# Patient Record
Sex: Female | Born: 1940 | Race: White | Hispanic: No | Smoking: Never smoker
Health system: Southern US, Community
[De-identification: ages and names within clinical notes are randomized; demographics above are authoritative.]

## PROBLEM LIST (undated history)

## (undated) DIAGNOSIS — I10 Essential (primary) hypertension: Secondary | ICD-10-CM

## (undated) DIAGNOSIS — K219 Gastro-esophageal reflux disease without esophagitis: Secondary | ICD-10-CM

## (undated) DIAGNOSIS — E039 Hypothyroidism, unspecified: Secondary | ICD-10-CM

## (undated) DIAGNOSIS — IMO0002 Reserved for concepts with insufficient information to code with codable children: Secondary | ICD-10-CM

## (undated) DIAGNOSIS — J4520 Mild intermittent asthma, uncomplicated: Secondary | ICD-10-CM

## (undated) DIAGNOSIS — I509 Heart failure, unspecified: Secondary | ICD-10-CM

## (undated) DIAGNOSIS — J84112 Idiopathic pulmonary fibrosis: Secondary | ICD-10-CM

## (undated) DIAGNOSIS — R5382 Chronic fatigue, unspecified: Secondary | ICD-10-CM

## (undated) DIAGNOSIS — E079 Disorder of thyroid, unspecified: Secondary | ICD-10-CM

## (undated) DIAGNOSIS — C801 Malignant (primary) neoplasm, unspecified: Secondary | ICD-10-CM

## (undated) DIAGNOSIS — R0602 Shortness of breath: Secondary | ICD-10-CM

## (undated) DIAGNOSIS — I209 Angina pectoris, unspecified: Secondary | ICD-10-CM

## (undated) DIAGNOSIS — J849 Interstitial pulmonary disease, unspecified: Secondary | ICD-10-CM

## (undated) DIAGNOSIS — Z836 Family history of other diseases of the respiratory system: Secondary | ICD-10-CM

## (undated) DIAGNOSIS — I639 Cerebral infarction, unspecified: Secondary | ICD-10-CM

## (undated) DIAGNOSIS — N8189 Other female genital prolapse: Secondary | ICD-10-CM

## (undated) HISTORY — DX: Other female genital prolapse: N81.89

## (undated) HISTORY — DX: Chronic fatigue, unspecified: R53.82

## (undated) HISTORY — PX: VAGINAL HYSTERECTOMY: SUR661

## (undated) HISTORY — DX: Interstitial pulmonary disease, unspecified: J84.9

## (undated) HISTORY — DX: Gastro-esophageal reflux disease without esophagitis: K21.9

## (undated) HISTORY — DX: Idiopathic pulmonary fibrosis: J84.112

## (undated) HISTORY — DX: Disorder of thyroid, unspecified: E07.9

## (undated) HISTORY — PX: TONSILLECTOMY: SUR1361

## (undated) HISTORY — PX: SHOULDER SURGERY: SHX246

## (undated) HISTORY — DX: Family history of other diseases of the respiratory system: Z83.6

## (undated) HISTORY — DX: Reserved for concepts with insufficient information to code with codable children: IMO0002

## (undated) HISTORY — DX: Mild intermittent asthma, uncomplicated: J45.20

## (undated) HISTORY — PX: OTHER SURGICAL HISTORY: SHX169

## (undated) HISTORY — DX: Essential (primary) hypertension: I10

---

## 1999-08-24 ENCOUNTER — Ambulatory Visit (HOSPITAL_COMMUNITY): Admission: RE | Admit: 1999-08-24 | Discharge: 1999-08-24 | Payer: Self-pay | Admitting: *Deleted

## 1999-08-24 ENCOUNTER — Encounter (INDEPENDENT_AMBULATORY_CARE_PROVIDER_SITE_OTHER): Payer: Self-pay | Admitting: Specialist

## 1999-08-24 ENCOUNTER — Encounter: Payer: Self-pay | Admitting: *Deleted

## 2001-04-29 ENCOUNTER — Encounter: Admission: RE | Admit: 2001-04-29 | Discharge: 2001-04-29 | Payer: Self-pay | Admitting: Internal Medicine

## 2001-04-29 ENCOUNTER — Encounter: Payer: Self-pay | Admitting: Internal Medicine

## 2002-05-19 ENCOUNTER — Encounter: Admission: RE | Admit: 2002-05-19 | Discharge: 2002-05-19 | Payer: Self-pay | Admitting: Internal Medicine

## 2002-05-19 ENCOUNTER — Encounter: Payer: Self-pay | Admitting: Internal Medicine

## 2003-05-30 ENCOUNTER — Encounter: Admission: RE | Admit: 2003-05-30 | Discharge: 2003-05-30 | Payer: Self-pay | Admitting: Internal Medicine

## 2004-06-25 ENCOUNTER — Encounter: Admission: RE | Admit: 2004-06-25 | Discharge: 2004-06-25 | Payer: Self-pay | Admitting: Internal Medicine

## 2005-05-24 ENCOUNTER — Encounter (INDEPENDENT_AMBULATORY_CARE_PROVIDER_SITE_OTHER): Payer: Self-pay | Admitting: *Deleted

## 2005-05-24 ENCOUNTER — Ambulatory Visit (HOSPITAL_COMMUNITY): Admission: RE | Admit: 2005-05-24 | Discharge: 2005-05-24 | Payer: Self-pay | Admitting: Gastroenterology

## 2005-05-24 LAB — HM COLONOSCOPY

## 2005-07-09 ENCOUNTER — Encounter: Admission: RE | Admit: 2005-07-09 | Discharge: 2005-07-09 | Payer: Self-pay | Admitting: Internal Medicine

## 2005-11-20 ENCOUNTER — Encounter: Admission: RE | Admit: 2005-11-20 | Discharge: 2005-11-20 | Payer: Self-pay | Admitting: Internal Medicine

## 2005-12-18 ENCOUNTER — Ambulatory Visit: Payer: Self-pay | Admitting: Podiatry

## 2006-07-11 ENCOUNTER — Encounter: Admission: RE | Admit: 2006-07-11 | Discharge: 2006-07-11 | Payer: Self-pay | Admitting: Internal Medicine

## 2007-08-26 ENCOUNTER — Encounter: Admission: RE | Admit: 2007-08-26 | Discharge: 2007-08-26 | Payer: Self-pay | Admitting: Internal Medicine

## 2007-09-08 ENCOUNTER — Encounter: Admission: RE | Admit: 2007-09-08 | Discharge: 2007-09-08 | Payer: Self-pay | Admitting: Internal Medicine

## 2008-06-10 HISTORY — PX: NECK SURGERY: SHX720

## 2008-09-20 ENCOUNTER — Encounter: Admission: RE | Admit: 2008-09-20 | Discharge: 2008-09-20 | Payer: Self-pay | Admitting: Internal Medicine

## 2008-10-26 ENCOUNTER — Emergency Department: Payer: Self-pay | Admitting: Emergency Medicine

## 2008-10-28 ENCOUNTER — Emergency Department: Payer: Self-pay | Admitting: Unknown Physician Specialty

## 2009-01-18 ENCOUNTER — Ambulatory Visit (HOSPITAL_COMMUNITY): Admission: RE | Admit: 2009-01-18 | Discharge: 2009-01-19 | Payer: Self-pay | Admitting: Orthopaedic Surgery

## 2009-09-19 ENCOUNTER — Inpatient Hospital Stay (HOSPITAL_COMMUNITY): Admission: EM | Admit: 2009-09-19 | Discharge: 2009-09-23 | Payer: Self-pay | Admitting: Emergency Medicine

## 2009-10-04 ENCOUNTER — Encounter: Admission: RE | Admit: 2009-10-04 | Discharge: 2009-10-04 | Payer: Self-pay | Admitting: Internal Medicine

## 2009-12-13 ENCOUNTER — Ambulatory Visit: Payer: Self-pay | Admitting: Family Medicine

## 2009-12-13 DIAGNOSIS — J0100 Acute maxillary sinusitis, unspecified: Secondary | ICD-10-CM | POA: Insufficient documentation

## 2009-12-13 DIAGNOSIS — J0120 Acute ethmoidal sinusitis, unspecified: Secondary | ICD-10-CM | POA: Insufficient documentation

## 2010-05-01 ENCOUNTER — Encounter: Admission: RE | Admit: 2010-05-01 | Discharge: 2010-05-01 | Payer: Self-pay | Admitting: Internal Medicine

## 2010-07-01 ENCOUNTER — Encounter: Payer: Self-pay | Admitting: Internal Medicine

## 2010-07-10 NOTE — Assessment & Plan Note (Signed)
Summary: SORE THROAT AND COLD-LIKE SYMPTOMS/EVM   Vital Signs:  Patient Profile:   70 Years Old Female CC:      Cold & URI symptoms Height:     62 inches Weight:      141 pounds BMI:     25.88 O2 Sat:      98 % O2 treatment:    Room Air Temp:     97.1 degrees F oral Pulse rate:   94 / minute Pulse rhythm:   regular Resp:     20 per minute BP sitting:   141 / 91  (left arm)  Pt. in pain?   yes    Location:   neck    Intensity:   3    Type:       aching  Vitals Entered By: Brent Bulla EMT-P (December 13, 2009 4:19 PM)              Is Patient Diabetic? No      Current Allergies: No known allergies History of Present Illness History from: patient Reason for visit: see chief complaint Chief Complaint: Cold & URI symptoms History of Present Illness: For 3-4 days she has been complaining of nasal congestion and headache. No f/c. For last 2 days she has had constant clear rhinorrhea and dry cough. At times she reports that she is unable to catch her breath b/c of the coughing. The coughing she belives is worse at night. She wants to know if there is something that she may take as she is celebrating her 69th birthday in a couple of day and is planning to travel.   REVIEW OF SYSTEMS Constitutional Symptoms       Complains of fatigue.     Denies fever, chills, and night sweats.  Ear/Nose/Throat/Mouth       Complains of ear pain, frequent runny nose, sinus problems, and hoarseness.      Denies change in hearing, ear discharge, dizziness, frequent nose bleeds, sore throat, and tooth pain or bleeding.  Respiratory       Complains of dry cough and shortness of breath.      Denies productive cough, wheezing, asthma, bronchitis, and emphysema/COPD.  Cardiovascular       Denies chest pain.    Neurological       Complains of headaches.    Social History: Retired Married Never Smoked Smoking Status:  never Physical Exam General appearance: well developed, well nourished, no acute  distress Head: +  maxillary sinus tenderness on the right Ears: fluid noted without inflammation bilateral TM R>L Nasal: clear discharge Oral/Pharynx: erythematous with thin clear PND Neck: neck supple,  trachea midline, no masses Chest/Lungs: no rales, wheezes, or rhonchi bilateral, breath sounds equal without effort - dry cough on exam Heart: regular rate and  rhythm, no murmur Skin: no obvious rashes or lesions MSE: oriented to time, place, and person Assessment New Problems: ACUTE MAXILLARY SINUSITIS (ICD-461.0) ACUTE ETHMOIDAL SINUSITIS (ICD-461.2)   Plan New Medications/Changes: ZITHROMAX Z-PAK 250 MG TABS (AZITHROMYCIN) take as directed  #1 pk x 0, 12/13/2009, Richardine Service MD  New Orders: Solumedrol up to 191m [J2930] Admin of Therapeutic Inj (IM or [place]) [[47340]New Patient Level II [[37096] The patient and/or caregiver has been counseled thoroughly with regard to medications prescribed including dosage, schedule, interactions, rationale for use, and possible side effects and they verbalize understanding.  Diagnoses and expected course of recovery discussed and will return if not improved as expected or if the  condition worsens. Patient and/or caregiver verbalized understanding.  Prescriptions: ZITHROMAX Z-PAK 250 MG TABS (AZITHROMYCIN) take as directed  #1 pk x 0   Entered and Authorized by:   Richardine Service MD   Signed by:   Richardine Service MD on 12/13/2009   Method used:   Electronically to        [facility])       [address], [place]  [address]       Ph: [phone]       Fax: [phone]   RxID:   [id number]   Patient Instructions: 1)  Acute sinusitis symptoms for less than 10 days may not be helped by antibiotics. 2)  Use warm moist compresses over affected areas to relieve pain. 3)  Over the counter decongestants such as Mucinex is okay to take as directed for 5-7 days. Make sure you  increased your amount of fluid intake.  4)  Use otc nasal wash twice a day for relief of symptoms. example - Netti Pot 5)  Call if no improvement in 5-7 days, sooner if increasing pain, fever, or new symptoms.  Medication Administration  Injection # 1:    Medication: Solumedrol up to 159m    Diagnosis: ACUTE MAXILLARY SINUSITIS (ICD-461.0)    Route: IM    Site: RUOQ gluteus    Exp Date: 07/10/2012    Lot #: [id number]    Patient tolerated injection without complications    Given by: RBrent BullaEMT-P (December 13, 2009 4:47 PM)  Orders Added: 1)  Solumedrol up to 1258m[J2930] 2)  Admin of Therapeutic Inj (IM or [place]) [9[33448])  New Patient Level II [99202]  The patient was informed that there is no on-call provider or services available at this clinic during off-hours (when the clinic is closed).  If the patient developed a problem or concern that required immediate attention, the patient was advised to go the the nearest available urgent care or emergency department for medical care.  The patient verbalized understanding.    The risks, benefits and possible side effects of the treatments and tests were explained clearly to the patient and the patient verbalized understanding.

## 2010-08-28 LAB — COMPREHENSIVE METABOLIC PANEL
ALT: 18 U/L (ref 0–35)
BUN: 9 mg/dL (ref 6–23)
CO2: 23 mEq/L (ref 19–32)
Calcium: 8.4 mg/dL (ref 8.4–10.5)
Chloride: 108 mEq/L (ref 96–112)
Creatinine, Ser: 0.83 mg/dL (ref 0.4–1.2)
GFR calc non Af Amer: >60 mL/min (ref >60)
Glucose, Bld: 83 mg/dL (ref 70–99)
Sodium: 138 mEq/L (ref 135–145)
Total Protein: 5.7 g/dL — ABNORMAL LOW (ref 6.0–8.3)

## 2010-08-28 LAB — CBC
HCT: 31.3 % — ABNORMAL LOW (ref 36.0–46.0)
Hemoglobin: 10.9 g/dL — ABNORMAL LOW (ref 12.0–15.0)
MCHC: 34.9 g/dL (ref 30.0–36.0)
MCV: 86.7 fL (ref 78.0–100.0)
RBC: 3.61 MIL/uL — ABNORMAL LOW (ref 3.87–5.11)
RDW: 14 % (ref 11.5–15.5)
WBC: 7.4 10*3/uL (ref 4.0–10.5)

## 2010-08-28 LAB — HEPATIC FUNCTION PANEL
ALT: 14 U/L (ref 0–35)
AST: 18 U/L (ref 0–37)
Albumin: 2.7 g/dL — ABNORMAL LOW (ref 3.5–5.2)
Total Protein: 5.6 g/dL — ABNORMAL LOW (ref 6.0–8.3)

## 2010-08-29 LAB — BASIC METABOLIC PANEL
BUN: 15 mg/dL (ref 6–23)
CO2: 24 mEq/L (ref 19–32)
CO2: 24 mEq/L (ref 19–32)
Calcium: 8.1 mg/dL — ABNORMAL LOW (ref 8.4–10.5)
Calcium: 8.4 mg/dL (ref 8.4–10.5)
Creatinine, Ser: 1.03 mg/dL (ref 0.4–1.2)
Creatinine, Ser: 1.16 mg/dL (ref 0.4–1.2)
GFR calc Af Amer: 56 mL/min — ABNORMAL LOW (ref >60)
GFR calc Af Amer: >60 mL/min (ref >60)
GFR calc non Af Amer: 53 mL/min — ABNORMAL LOW (ref >60)
Glucose, Bld: 109 mg/dL — ABNORMAL HIGH (ref 70–99)
Potassium: 4.1 mEq/L (ref 3.5–5.1)
Sodium: 136 mEq/L (ref 135–145)
Sodium: 138 mEq/L (ref 135–145)

## 2010-08-29 LAB — URINALYSIS, ROUTINE W REFLEX MICROSCOPIC
Bilirubin Urine: NEGATIVE
Ketones, ur: 15 mg/dL — AB
Nitrite: POSITIVE — AB
Protein, ur: 30 mg/dL — AB
Urobilinogen, UA: 0.2 mg/dL (ref 0.0–1.0)
pH: 6 (ref 5.0–8.0)

## 2010-08-29 LAB — DIFFERENTIAL
Basophils Absolute: 0 10*3/uL (ref 0.0–0.1)
Basophils Absolute: 0 10*3/uL (ref 0.0–0.1)
Basophils Relative: 0 % (ref 0–1)
Basophils Relative: 0 % (ref 0–1)
Eosinophils Relative: 0 % (ref 0–5)
Eosinophils Relative: 1 % (ref 0–5)
Lymphocytes Relative: 10 % — ABNORMAL LOW (ref 12–46)
Monocytes Absolute: 0.1 10*3/uL (ref 0.1–1.0)
Monocytes Absolute: 1.1 10*3/uL — ABNORMAL HIGH (ref 0.1–1.0)
Monocytes Relative: 8 % (ref 3–12)
Neutro Abs: 10.5 10*3/uL — ABNORMAL HIGH (ref 1.7–7.7)

## 2010-08-29 LAB — URINE CULTURE: Colony Count: >=100000

## 2010-08-29 LAB — CBC
HCT: 31.6 % — ABNORMAL LOW (ref 36.0–46.0)
HCT: 36.3 % (ref 36.0–46.0)
Hemoglobin: 10.6 g/dL — ABNORMAL LOW (ref 12.0–15.0)
Hemoglobin: 10.9 g/dL — ABNORMAL LOW (ref 12.0–15.0)
MCHC: 33.6 g/dL (ref 30.0–36.0)
MCHC: 33.8 g/dL (ref 30.0–36.0)
Platelets: 117 10*3/uL — ABNORMAL LOW (ref 150–400)
Platelets: 152 10*3/uL (ref 150–400)
RBC: 3.59 MIL/uL — ABNORMAL LOW (ref 3.87–5.11)
RBC: 3.66 MIL/uL — ABNORMAL LOW (ref 3.87–5.11)
RBC: 4.21 MIL/uL (ref 3.87–5.11)
RDW: 14.3 % (ref 11.5–15.5)
WBC: 10.2 10*3/uL (ref 4.0–10.5)
WBC: 9.9 10*3/uL (ref 4.0–10.5)

## 2010-08-29 LAB — TSH: TSH: 0.213 u[IU]/mL — ABNORMAL LOW (ref 0.350–4.500)

## 2010-08-29 LAB — COMPREHENSIVE METABOLIC PANEL
AST: 24 U/L (ref 0–37)
Albumin: 3.5 g/dL (ref 3.5–5.2)
Alkaline Phosphatase: 55 U/L (ref 39–117)
BUN: 18 mg/dL (ref 6–23)
CO2: 25 mEq/L (ref 19–32)
Chloride: 104 mEq/L (ref 96–112)
Creatinine, Ser: 1.06 mg/dL (ref 0.4–1.2)
GFR calc Af Amer: >60 mL/min (ref >60)
GFR calc non Af Amer: 51 mL/min — ABNORMAL LOW (ref >60)
Potassium: 3.3 mEq/L — ABNORMAL LOW (ref 3.5–5.1)
Total Bilirubin: 1.4 mg/dL — ABNORMAL HIGH (ref 0.3–1.2)

## 2010-08-29 LAB — CULTURE, BLOOD (ROUTINE X 2)

## 2010-08-29 LAB — LIPID PANEL
Cholesterol: 95 mg/dL (ref 0–200)
HDL: 25 mg/dL — ABNORMAL LOW (ref >39)
Triglycerides: 120 mg/dL (ref <150)
VLDL: 24 mg/dL (ref 0–40)

## 2010-08-29 LAB — URINE MICROSCOPIC-ADD ON

## 2010-08-29 LAB — HEMOGLOBIN A1C: Mean Plasma Glucose: 151 mg/dL

## 2010-09-16 LAB — COMPREHENSIVE METABOLIC PANEL
AST: 25 U/L (ref 0–37)
BUN: 12 mg/dL (ref 6–23)
CO2: 29 mEq/L (ref 19–32)
Chloride: 102 mEq/L (ref 96–112)
Creatinine, Ser: 0.74 mg/dL (ref 0.4–1.2)
GFR calc Af Amer: >60 mL/min (ref >60)
GFR calc non Af Amer: >60 mL/min (ref >60)
Glucose, Bld: 89 mg/dL (ref 70–99)
Total Bilirubin: 0.6 mg/dL (ref 0.3–1.2)

## 2010-09-16 LAB — DIFFERENTIAL
Basophils Absolute: 0 10*3/uL (ref 0.0–0.1)
Basophils Relative: 1 % (ref 0–1)
Eosinophils Relative: 4 % (ref 0–5)
Lymphocytes Relative: 27 % (ref 12–46)
Monocytes Relative: 10 % (ref 3–12)
Neutro Abs: 3.5 10*3/uL (ref 1.7–7.7)
Neutrophils Relative %: 59 % (ref 43–77)

## 2010-09-16 LAB — CBC
HCT: 40.3 % (ref 36.0–46.0)
Hemoglobin: 13.4 g/dL (ref 12.0–15.0)
MCHC: 33.3 g/dL (ref 30.0–36.0)
MCV: 87.6 fL (ref 78.0–100.0)
RBC: 4.6 MIL/uL (ref 3.87–5.11)
WBC: 6 10*3/uL (ref 4.0–10.5)

## 2010-09-16 LAB — GLUCOSE, CAPILLARY: Glucose-Capillary: 127 mg/dL — ABNORMAL HIGH (ref 70–99)

## 2010-09-24 ENCOUNTER — Other Ambulatory Visit: Payer: Self-pay | Admitting: Internal Medicine

## 2010-09-24 DIAGNOSIS — Z1231 Encounter for screening mammogram for malignant neoplasm of breast: Secondary | ICD-10-CM

## 2010-10-08 ENCOUNTER — Ambulatory Visit
Admission: RE | Admit: 2010-10-08 | Discharge: 2010-10-08 | Disposition: A | Discharge status: Home / Self Care | Payer: Medicare Other | Source: Ambulatory Visit | Attending: Internal Medicine | Admitting: Internal Medicine

## 2010-10-08 DIAGNOSIS — Z1231 Encounter for screening mammogram for malignant neoplasm of breast: Secondary | ICD-10-CM

## 2010-10-23 NOTE — Op Note (Signed)
NAME:  Tina Calderon, Tina Calderon               ACCOUNT NO.:  [id number]   MEDICAL RECORD NO.:  [id number]          PATIENT TYPE:  INP   LOCATION:  5022                         FACILITY:  [facility]   PHYSICIAN:  Mark C. Lorin Mercy, M.D.    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   POSTOPERATIVE DIAGNOSIS:  C5-6, C6-7 spondylosis with foraminal  stenosis.   PROCEDURE:  C5-6, C6-7 anterior cervical diskectomy and fusion,  allograft ,plate, microscope assisted.   SURGEON:  Rodell Perna, MD   ASSISTANT:  Phillips Hay, PA-C   ESTIMATED BLOOD LOSS:  200 mL.   COMPONENTS USED:  VueLock Biomet EBI plate 28 mm with 41-UL locking  screws x6.   PROCEDURE:  After induction of general anesthesia with orotracheal  intubation, preoperative Ancef prophylaxis, arms tucked to the side with  pads over the ulnar nerve.  Careful padding and positioning, horseshoe  head holder usage, neck was prepped with DuraPrep after head halter  traction was applied without weight.  Area was squared with towels,  sterile skin marker was used in a prominent skin fold overlying the  planned region based on palpable landmarks with cricothyroid cartilage  and carotid tubercle as well as fingerbreadths above the clavicle.  Platysma was divided in line with the skin incision.  Blunt dissection  down the level of the longus colli was performed with carotid sheath and  contents lateral.  There were prominent spurs at C5-6 and C6-7 as  expected.  Short 25 needle was placed in the C5-6 level.  Diskectomy was  performed with a 15 scalpel blade after lateral C-arm image confirmed  the appropriate level.  Cloward curettes were used as well as bur with  removal of the endplates and progression of the posterior cortex.  With  the operative microscope the spurs removed, Cloward curette  and 1 and 2-  mm Kerrisons were used for removal of the posterior spurs and a thick  hypertrophic ligament.  Dura was completely exposed.  Trial sizers 6  gave an excellent fit.  Corticocancellous graft was  rehydrated, marked  anteriorly at the midline and then inserted with the CRNA applying  traction to the neck impacted and countersunk 1-2 mm.  Uncovertebral  joints had been stripped and spurs removed from both gutters.  Identical  procedure was performed at C6-7 level.  At this level, there again was  some spur overhung posteriorly with foraminal narrowing, worse on the  left than right which was removed.  With inserting the graft, the graft  inserted a little bit more dorsally and slightly more countersunk.  It  was checked using the microscope, feeling underneath with a black nerve  hook as well as behind the vertebral body on each side of the vertebrae.  The graft did not rotate as it was inserted still was in the midline and  there was still 2-3 mm behind the graft.  After irrigation C-arm was  brought in and graft  was checked.  Traction was applied to the neck this  was also pulled down on the shoulders using a traction pulling on the  fingers showed satisfactory position.  Plate was inserted and there was  good sizing and all screw holes were filled after plate holder was used  to check AP and lateral to make sure the plate was not too high or too  low.  Screws in C6 were exactly in the middle of the vertebral body.  After screws were filled, AP and lateral fluoroscopy were used to check.  Grafts were in good position and recheck of the bottom graft confirmed  that it was not too far posterior.  After irrigation, Hemovac was placed  in an in-and-out technique in line with the skin incision.  Operative  field was dry.  At the C6-7 level, there was an epidural vein in the  right side and some FloSeal was placed with a small patty.  This was dry  at the end of the procedure and at  the time of insertion of the screw  the screw insertion in the C7 body on the right had blood coming out  from the screw hole and this stopped once the screw was inserted.  Total  blood loss during the case was 200 mL.  Layered closure of platysma 3-0  Vicryl and 4-0 Vicryl.  Operative field was dry at the time of closure.  Careful inspection showed no areas of bleeding and the fingertip was  placed down distally or toward the chest in case there was an excess  fluid and it could decompress toward the mediastinum.  Operative field  was dry.  Skin closed with 4-0 Vicryl.  Tincture of Benzoin, Steri-  Strips, Marcaine infiltration, postop dressing and a soft cervical  collar was applied.  A 6-mm grasper was used at both levels and after  soft collar application, the patient was extubated and transferred to  the recovery room.  Instrument count and needle count was correct.      Mark C. Lorin Mercy, M.D.  Electronically Signed     MCY/MEDQ  D:  01/18/2009  T:  01/19/2009  Job:  [id number]

## 2010-10-26 NOTE — Op Note (Signed)
NAME:  Tina Calderon, Tina Calderon               ACCOUNT NO.:  [id number]   MEDICAL RECORD NO.:  [id number]          PATIENT TYPE:  AMB   LOCATION:  ENDO                         FACILITY:  [facility]   PHYSICIAN:  Nelwyn Salisbury, M.D.  DATE OF BIRTH:  1941/03/05   DATE OF PROCEDURE:  05/24/2005  DATE OF DISCHARGE:                                 OPERATIVE REPORT   PROCEDURE PERFORMED:  Colonoscopy with cold biopsies x3.   ENDOSCOPIST:  Nelwyn Salisbury, M.D.   INSTRUMENT USED:  Olympus video colonoscope.   INDICATIONS FOR PROCEDURE:  A 70 year old white female undergoing screening  colonoscopy to rule out colonic polyps, masses, etc.   PREPROCEDURE PREPARATION:  Informed consent was procured from the patient.  The patient was fasted for four hours prior to the procedure and prepped  with Osmoprep pills the night of and the morning of the procedure.  Risks  and benefits of the procedure including a 10% miss rate of cancer and polyps  was discussed with the patient as well.   PREPROCEDURE PHYSICAL:  VITAL SIGNS:  Stable vital signs.  NECK:  Supple.  CHEST:  Clear to auscultation.  CARDIOVASCULAR:  S1 and S2 regular.  ABDOMEN:  Soft with normal bowel sounds.   DESCRIPTION OF PROCEDURE:  The patient was placed in left lateral decubitus  position, sedated with 70 mcg of fentanyl and 6 mg of Versed in slow  incremental doses.  Once the patient was adequately sedated and maintained  on low flow oxygen and continuous cardiac monitoring, the Olympus video  colonoscope was advanced from the rectum to the cecum.  The appendiceal  orifice and ileocecal valve were clearly visualized and photographed.  Three  small sessile polyps were biopsied from the rectosigmoid colon (cold  biopsies x3).  A few early sigmoid diverticula were seen.  The rest of the  exam up to the terminal ileum was unremarkable.  The patient tolerated the  procedure well without complications.   IMPRESSION:  1.A few early sigmoid  diverticula seen.  2.Three small polyps biopsied from rectal sigmoid colon.  3.Otherwise normal exam up to the terminal ileum.   RECOMMENDATIONS:  1.Await  pathology results.  2.Repeat colonoscopy depending on pathology results.  3.Avoid nonsteroidals including aspirin for the next two weeks.  4.Outpatient follow-up as need arises in the future.      Nelwyn Salisbury, M.D.  Electronically Signed     JNM/MEDQ  D:  05/24/2005  T:  05/27/2005  Job:  [id number]   cc:   Ardeen Jourdain, M.D.  Fax: [phone]

## 2011-06-11 DIAGNOSIS — J84112 Idiopathic pulmonary fibrosis: Secondary | ICD-10-CM

## 2011-06-11 HISTORY — DX: Idiopathic pulmonary fibrosis: J84.112

## 2011-10-03 ENCOUNTER — Telehealth: Payer: Self-pay

## 2011-10-03 NOTE — Telephone Encounter (Signed)
Left message for pt to call the office/  Pt needs ov for pessary change.PG CMA

## 2011-10-04 ENCOUNTER — Telehealth: Payer: Self-pay

## 2011-10-04 NOTE — Telephone Encounter (Signed)
Pt. Returned call and scheduled appt for pessary insertion 10/18/11 @ 4:15 pm with Dr. Robby Sermon CMA

## 2011-10-14 ENCOUNTER — Telehealth: Payer: Self-pay

## 2011-10-14 NOTE — Telephone Encounter (Signed)
Left message for pt to call the office/next appt need to be rescheduled.PG CMA

## 2011-10-18 ENCOUNTER — Encounter: Payer: Self-pay | Admitting: Obstetrics and Gynecology

## 2011-10-22 ENCOUNTER — Other Ambulatory Visit: Payer: Self-pay | Admitting: Internal Medicine

## 2011-10-22 DIAGNOSIS — Z1231 Encounter for screening mammogram for malignant neoplasm of breast: Secondary | ICD-10-CM

## 2011-10-25 ENCOUNTER — Encounter: Payer: Self-pay | Admitting: Obstetrics and Gynecology

## 2011-11-08 ENCOUNTER — Ambulatory Visit
Admission: RE | Admit: 2011-11-08 | Discharge: 2011-11-08 | Disposition: A | Discharge status: Home / Self Care | Payer: Medicare Other | Source: Ambulatory Visit | Attending: Internal Medicine | Admitting: Internal Medicine

## 2011-11-08 DIAGNOSIS — Z1231 Encounter for screening mammogram for malignant neoplasm of breast: Secondary | ICD-10-CM

## 2011-11-11 ENCOUNTER — Encounter: Payer: Self-pay | Admitting: Obstetrics and Gynecology

## 2011-11-11 ENCOUNTER — Ambulatory Visit (INDEPENDENT_AMBULATORY_CARE_PROVIDER_SITE_OTHER): Payer: Medicare Other | Admitting: Obstetrics and Gynecology

## 2011-11-11 VITALS — BP 120/62 | Ht 62.0 in | Wt 140.0 lb

## 2011-11-11 DIAGNOSIS — N8111 Cystocele, midline: Secondary | ICD-10-CM

## 2011-11-11 DIAGNOSIS — Z719 Counseling, unspecified: Secondary | ICD-10-CM

## 2011-11-11 DIAGNOSIS — IMO0002 Reserved for concepts with insufficient information to code with codable children: Secondary | ICD-10-CM

## 2011-11-11 DIAGNOSIS — N8189 Other female genital prolapse: Secondary | ICD-10-CM

## 2011-11-11 NOTE — Progress Notes (Signed)
Nervous about insertion, no complaints  Filed Vitals:   11/11/11 1142  BP: 120/62   ROS: noncontributory  Pelvic exam:  VULVA: normal appearing vulva with no masses, tenderness or lesions, VAGINA: normal appearing vagina with normal color and discharge, no lesions, cystocele S/p hyst  A/P Ringed Pessary inserted without difficulty RTO 1 month

## 2011-11-12 ENCOUNTER — Telehealth: Payer: Self-pay | Admitting: Obstetrics and Gynecology

## 2011-11-12 NOTE — Telephone Encounter (Signed)
Jackie/pam/AR pt

## 2011-11-12 NOTE — Telephone Encounter (Signed)
Pt called complaining of leaking large amouts of urine since pessary was inserted on yesterday. Pt was advised to wear pads/depends until pessary removed. Pt is scheduled to come in 6/5 _0 :45 to see EP. 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 Pt voice understanding. Jeanella Craze

## 2011-11-13 ENCOUNTER — Encounter: Payer: Self-pay | Admitting: Obstetrics and Gynecology

## 2011-11-13 ENCOUNTER — Ambulatory Visit (INDEPENDENT_AMBULATORY_CARE_PROVIDER_SITE_OTHER): Payer: Medicare Other | Admitting: Obstetrics and Gynecology

## 2011-11-13 VITALS — BP 104/70 | Temp 98.1°F | Ht 62.5 in | Wt 141.0 lb

## 2011-11-13 DIAGNOSIS — IMO0002 Reserved for concepts with insufficient information to code with codable children: Secondary | ICD-10-CM

## 2011-11-13 DIAGNOSIS — E039 Hypothyroidism, unspecified: Secondary | ICD-10-CM | POA: Insufficient documentation

## 2011-11-13 DIAGNOSIS — N8111 Cystocele, midline: Secondary | ICD-10-CM

## 2011-11-13 DIAGNOSIS — N390 Urinary tract infection, site not specified: Secondary | ICD-10-CM

## 2011-11-13 DIAGNOSIS — Z9889 Other specified postprocedural states: Secondary | ICD-10-CM | POA: Insufficient documentation

## 2011-11-13 DIAGNOSIS — R32 Unspecified urinary incontinence: Secondary | ICD-10-CM

## 2011-11-13 DIAGNOSIS — E079 Disorder of thyroid, unspecified: Secondary | ICD-10-CM

## 2011-11-13 LAB — POCT URINALYSIS DIPSTICK
Blood, UA: NEGATIVE
Protein, UA: NEGATIVE
Spec Grav, UA: 1.01
Urobilinogen, UA: NEGATIVE
pH, UA: 5

## 2011-11-13 MED ORDER — NITROFURANTOIN MONOHYD MACRO 100 MG PO CAPS: 100.0000 mg | ORAL_CAPSULE | Freq: Two times a day (BID) | ORAL | Status: AC

## 2011-11-13 NOTE — Progress Notes (Signed)
Pt has been scheduled for ov on tomorrow with Dr. Mancel Bale.Marland KitchenMarland KitchenOv for July 9th has been canceled. Thanks Miss EP. Jeanella Craze

## 2011-11-13 NOTE — Progress Notes (Signed)
71 YO with Ring Pessary fitted on 6/2 and that night patient began leaking urine. Denies dysuria, flank pain, fever, pelvic pain or sensation of incomplete emptying of bladder. Had previous "bladder tack" in 1992 when she had her hysterectomy.   O: U/A-   pH 5.0   SG  1.010    leukocytes 2+ Abdomen: soft, mildly tender in suprapubic area without guarding or rebound  Pelvic: EGBUS-atrophic, vagina-no lesions, pessary removed; uterus and cervix surgically absent.     A: UTI     Symptomatic Cystocele   P: urine for culture-pending      Macrobid 138m # 14 bid pc x 7 days no refills      To advise Dr. RMancel Balethat patient wants to proceed with     surgery for her bladder issues.      Patient given her pessary in a bio-hazard bag after it was     cleansed  Tina Calderon,ELMIRAPA-C

## 2011-11-14 ENCOUNTER — Ambulatory Visit (INDEPENDENT_AMBULATORY_CARE_PROVIDER_SITE_OTHER): Payer: Medicare Other | Admitting: Obstetrics and Gynecology

## 2011-11-14 ENCOUNTER — Telehealth: Payer: Self-pay

## 2011-11-14 ENCOUNTER — Encounter: Payer: Self-pay | Admitting: Obstetrics and Gynecology

## 2011-11-14 VITALS — BP 124/80 | Temp 98.2°F | Resp 14 | Ht 62.0 in | Wt 140.0 lb

## 2011-11-14 DIAGNOSIS — IMO0002 Reserved for concepts with insufficient information to code with codable children: Secondary | ICD-10-CM

## 2011-11-14 DIAGNOSIS — N816 Rectocele: Secondary | ICD-10-CM

## 2011-11-14 DIAGNOSIS — N8189 Other female genital prolapse: Secondary | ICD-10-CM

## 2011-11-14 DIAGNOSIS — N8111 Cystocele, midline: Secondary | ICD-10-CM

## 2011-11-14 NOTE — Telephone Encounter (Signed)
Informed Dr. Mancel Bale & Levada Dy unsuccessful in contacting pt regarding her appt. Dr. Mancel Bale states she will see the patient whenever she shows up.

## 2011-11-14 NOTE — Telephone Encounter (Signed)
Attempted to reach pt regarding her appointment accidentally scheduled _0 :15am today. Not able to LM no VM available. Will inform Levada Dy.

## 2011-11-14 NOTE — Progress Notes (Signed)
Vag. Discharge:no Odor:yes Fever:no Irreg.Periods:no Dyspareunia:no OVANVBT:YO Frequency:no Urgency:no Hematuria:no Kidney stones:no Constipation:no Diarrhea:no Rectal Bleeding: no Vomiting:no Nausea:no Pregnant:no Fibroids:no Endometriosis:no Hx of Ovarian Cyst:no Hx IUD:no Hx STD-PID:no Appendectomy:yes Gall Bladder Dz:no  Undergoing tx for UTI, s/p removal of pessary secondary to incontinence.  Pt wants surgery and says it is hanging down worse today. C/o abdominal and slight back discomfort and a bulge that occas protrudes at introitus.    Filed Vitals:   11/14/11 1538  BP: 124/80  Temp: 98.2 F (36.8 C)  Resp: 14   2nd - 3rd degree cystocele Mild rectocele  A/P Cystocele and Rectocele I informed pt that replacing pessary after completion of tx for UTI may not produce incontinence again however, pessary may be unmasking incontinence. Pt does not want to try pessary again.  Risks, benefits and alternatives discussed with the patient.  Pt wants to proceed with surgery.  I also explained it may not relieve the pressure/ pelvic discomfort that she feels but she still would like to proceed. Pt wants to proceed prior to 12/08/11. Will schedule Cont course of abx

## 2011-11-15 LAB — URINE CULTURE

## 2011-11-19 ENCOUNTER — Telehealth: Payer: Self-pay | Admitting: Obstetrics and Gynecology

## 2011-11-19 NOTE — Telephone Encounter (Signed)
A&P Repair/Cystoscopy scheduled for 12/05/11 @ 9:00 with AR/EP. Patient has [employer]/MCR. Adrianne Pridgen

## 2011-11-20 ENCOUNTER — Other Ambulatory Visit: Payer: Self-pay | Admitting: Obstetrics and Gynecology

## 2011-11-25 ENCOUNTER — Encounter (HOSPITAL_COMMUNITY): Payer: Self-pay | Admitting: Pharmacy Technician

## 2011-11-29 ENCOUNTER — Encounter: Payer: Self-pay | Admitting: Obstetrics and Gynecology

## 2011-11-29 ENCOUNTER — Other Ambulatory Visit: Payer: Self-pay

## 2011-11-29 ENCOUNTER — Ambulatory Visit (INDEPENDENT_AMBULATORY_CARE_PROVIDER_SITE_OTHER): Payer: Medicare Other | Admitting: Obstetrics and Gynecology

## 2011-11-29 ENCOUNTER — Encounter (HOSPITAL_COMMUNITY)
Admission: RE | Admit: 2011-11-29 | Discharge: 2011-11-29 | Disposition: A | Discharge status: Home / Self Care | Payer: Medicare Other | Source: Ambulatory Visit | Attending: Obstetrics and Gynecology | Admitting: Obstetrics and Gynecology

## 2011-11-29 ENCOUNTER — Encounter (HOSPITAL_COMMUNITY): Payer: Self-pay

## 2011-11-29 VITALS — BP 128/80 | HR 76 | Temp 98.1°F | Resp 16 | Ht 62.5 in | Wt 140.0 lb

## 2011-11-29 DIAGNOSIS — Z01818 Encounter for other preprocedural examination: Secondary | ICD-10-CM

## 2011-11-29 HISTORY — DX: Hypothyroidism, unspecified: E03.9

## 2011-11-29 LAB — BASIC METABOLIC PANEL
BUN: 20 mg/dL (ref 6–23)
CO2: 29 mEq/L (ref 19–32)
Calcium: 9.8 mg/dL (ref 8.4–10.5)
Creatinine, Ser: 0.82 mg/dL (ref 0.50–1.10)
Glucose, Bld: 82 mg/dL (ref 70–99)
Sodium: 138 mEq/L (ref 135–145)

## 2011-11-29 LAB — CBC
HCT: 39.9 % (ref 36.0–46.0)
MCH: 26.3 pg (ref 26.0–34.0)
MCHC: 32.1 g/dL (ref 30.0–36.0)
MCV: 82.1 fL (ref 78.0–100.0)
Platelets: 190 10*3/uL (ref 150–400)
RDW: 15.6 % — ABNORMAL HIGH (ref 11.5–15.5)
WBC: 6.5 10*3/uL (ref 4.0–10.5)

## 2011-11-29 NOTE — Progress Notes (Signed)
Tina Calderon is a 71 y.o. female G2P2002 who presents for an anterior-posterior colporrhaphy because of symptomatic pelvic relaxation.  Since patient's hysterectomy and "bladder tack"  in 1992 she has noticed a gradual increase in pelvic pressure and lower back pain.  Over the past year, this has been accompanied by a bulging sensation in her vaginal area. She denies any urinary incontinence, retention, problems with defecation (unless she becomes constipated and has to press on her perineum to assist evacuation) or feelings of an incomplete emptying of her bladder.  She was fitted with a ring pessary earlier this month however, she actually developed a problem  with incontinence after if was placed.  Two days later it was removed and patient was discovered to have a urinary tract infection.  In spite of this fact, patient has decided that she no longer wants to were a pessary and would like surgery to repair her pelvic relaxation.  Past Medical History  OB History: G2P2002 SVD, 1960 and 1977  GYN History: menarche  71 YO  LMP 1992 (S/P Hysterectomy);   The patient denies history of sexually transmitted disease.  Denies history of abnormal PAP smear    Medical History: Right arm fracture, hypertension and thyroid disease  Surgical History: 1946-Tonsillectomy;  1992-Hysterectomy with "Bladder Tack";  2001-Right Shoulder;  2002-2010-bilateral foot surgeries (bunions/hammertoes)'  2003-Neck Fusion C 5-6-7 Denies problems with anesthesia or history of blood transfusions  Family History: Cancer, hypertension and stroke  Social History:  Married; Denies tobacco, alcohol or illicit drug use.   Outpatient Encounter Prescriptions as of 11/29/2011  Medication Sig Dispense Refill  . calcium-vitamin D (OSCAL WITH D) 500-200 MG-UNIT per tablet Take 1 tablet by mouth every morning.      . hydrochlorothiazide (HYDRODIURIL) 25 MG tablet Take 25 mg by mouth every morning.       [name] levothyroxine (SYNTHROID,  LEVOTHROID) 100 MCG tablet Take 100 mcg by mouth every morning.       . loratadine (CLARITIN) 10 MG tablet Take 10 mg by mouth every evening.      . Multiple Vitamin (MULTIVITAMIN WITH MINERALS) TABS Take 1 tablet by mouth every morning. Centrum silver        No Known Allergies and denies sensitivity to latex peanuts, shellfish, soy and band-aids.  ROS: + glasses, upper and lower jaw dentures bu denies headache, vision changes, dysphagia, tinnitus, dizziness,  chest pain, shortness of breath, nausea, vomiting, diarrhea, dysuria, hematuria, pelvic pain, swelling of joints,easy bruising,  myalgias, arthralgias, skin rashes and except as is mentioned in the history of present illness, patient's review of systems is otherwise negative  Physical Exam    BP 128/80  Pulse 76  Temp 98.1 F (36.7 C) (Oral)  Resp 16  Ht 5' 2.5" (1.588 m)  Wt 140 lb (63.504 kg)  BMI 25.20 kg/m2  Neck: supple without masses or thyromegal Lungs: clear to auscultation Heart: regular rate and rhythm Abdomen: soft, non-tender and no organomegaly Pelvic:EGBUS- wnl; vagina- 3/4 cystocele and 2/4 rectocele uterus and cervix are surgically absent. Extremities:  no clubbing, cyanosis or edema   Assesment: Symptomatic Pelvic Relaxation                      S/P Hysterectomy/Bladder Surgery   Disposition:  A discussion was held with patient regarding the indication for her procedure(s) along with the risks, which include but are not limited to: reaction to anesthesia, damage to adjacent organs, infection, excessive bleeding. worsening of symptoms  and that the surgery may not alleviate her symptoms..  Patient verbalized understanding of these risks and has consented to proceed with anterior-posterior colporrhaphy and cystoscopy at [facility], June 27 at 9 a.m.  CSN# [id number]   Charod Slawinski J. Florene Glen, PA-C  for Dr. Harvie Bridge. Mancel Bale

## 2011-11-29 NOTE — Patient Instructions (Addendum)
YOUR PROCEDURE IS SCHEDULED ON:12/05/11  ENTER THROUGH THE MAIN ENTRANCE OF [facility] AT:0730 am  USE DESK PHONE AND DIAL [phone] TO INFORM us OF YOUR ARRIVAL  CALL [phone] IF YOU HAVE ANY QUESTIONS OR PROBLEMS PRIOR TO YOUR ARRIVAL.  REMEMBER: DO NOT EAT OR DRINK AFTER MIDNIGHT : Wed.    YOU MAY BRUSH YOUR TEETH THE MORNING OF SURGERY   TAKE THESE MEDICINES THE DAY OF SURGERY WITH SIP OF WATER:thyroid med and BP med   DO NOT WEAR JEWELRY, EYE MAKEUP, LIPSTICK OR DARK FINGERNAIL POLISH DO NOT WEAR LOTIONS OR DEODORANT DO NOT SHAVE FOR 60 HOURS PRIOR TO SURGERY  YOU WILL NOT BE ALLOWED TO [place].  NAME OF DRIVER:Mary Janit Bern

## 2011-11-30 NOTE — H&P (Signed)
Tina Calderon is a 71 y.o. female G2P2002 who presents for an anterior-posterior colporrhaphy because of symptomatic pelvic relaxation.  Since patient's hysterectomy and "bladder tack"  in 1992 she has noticed a gradual increase in pelvic pressure and lower back pain.  Over the past year, this has been accompanied by a bulging sensation in her vaginal area. She denies any urinary incontinence, retention, problems with defecation (unless she becomes constipated and has to press on her perineum to assist evacuation) or feelings of an incomplete emptying of her bladder.  She was fitted with a ring pessary earlier this month however, she actually developed a problem  with incontinence after if was placed.  Two days later it was removed and patient was discovered to have a urinary tract infection.  In spite of this fact, patient has decided that she no longer wants to were a pessary and would like surgery to repair her pelvic relaxation.  Past Medical History  OB History: G2P2002 SVD, 1960 and 1977  GYN History: menarche  71 YO  LMP 1992 (S/P Hysterectomy);   The patient denies history of sexually transmitted disease.  Denies history of abnormal PAP smear    Medical History: Right arm fracture, hypertension and thyroid disease  Surgical History: 1946-Tonsillectomy;  1992-Hysterectomy with "Bladder Tack";  2001-Right Shoulder;  2002-2010-bilateral foot surgeries (bunions/hammertoes)'  2003-Neck Fusion C 5-6-7 Denies problems with anesthesia or history of blood transfusions  Family History: Cancer, hypertension and stroke  Social History:  Married; Denies tobacco, alcohol or illicit drug use.   Outpatient Encounter Prescriptions as of 11/29/2011  Medication Sig Dispense Refill  . calcium-vitamin D (OSCAL WITH D) 500-200 MG-UNIT per tablet Take 1 tablet by mouth every morning.      . hydrochlorothiazide (HYDRODIURIL) 25 MG tablet Take 25 mg by mouth every morning.       [name] levothyroxine (SYNTHROID,  LEVOTHROID) 100 MCG tablet Take 100 mcg by mouth every morning.       . loratadine (CLARITIN) 10 MG tablet Take 10 mg by mouth every evening.      . Multiple Vitamin (MULTIVITAMIN WITH MINERALS) TABS Take 1 tablet by mouth every morning. Centrum silver        No Known Allergies and denies sensitivity to latex peanuts, shellfish, soy and band-aids.  ROS: + glasses, upper and lower jaw dentures bu denies headache, vision changes, dysphagia, tinnitus, dizziness,  chest pain, shortness of breath, nausea, vomiting, diarrhea, dysuria, hematuria, pelvic pain, swelling of joints,easy bruising,  myalgias, arthralgias, skin rashes and except as is mentioned in the history of present illness, patient's review of systems is otherwise negative  Physical Exam    BP 128/80  Pulse 76  Temp 98.1 F (36.7 C) (Oral)  Resp 16  Ht 5' 2.5" (1.588 m)  Wt 140 lb (63.504 kg)  BMI 25.20 kg/m2  Neck: supple without masses or thyromegaly Lungs: clear to auscultation Heart: regular rate and rhythm Abdomen: soft, non-tender and no organomegaly Pelvic:EGBUS- wnl; vagina- 3/4 cystocele and 2/4 rectocele uterus and cervix are surgically absent. Extremities:  no clubbing, cyanosis or edema   Assesment: Symptomatic Pelvic Relaxation                      S/P Hysterectomy/Bladder Surgery   Disposition:  A discussion was held with patient regarding the indication for her procedure(s) along with the risks, which include but are not limited to: reaction to anesthesia, damage to adjacent organs, infection, excessive bleeding. worsening of symptoms  and that the surgery may not alleviate her symptoms..  Patient verbalized understanding of these risks and has consented to proceed with anterior-posterior colporrhaphy and cystoscopy at [place], June 27 at 9 a.m.  CSN# [id number]   Eberardo Demello J. Florene Glen, PA-C  for Dr. Harvie Bridge. Mancel Bale

## 2011-12-04 MED ORDER — DEXTROSE 5 % IV SOLN
1.0000 g | INTRAVENOUS | Status: AC
  Administered 2011-12-05: 1 g via INTRAVENOUS
  Filled 2011-12-04: qty 1

## 2011-12-05 ENCOUNTER — Encounter (HOSPITAL_COMMUNITY): Payer: Self-pay

## 2011-12-05 ENCOUNTER — Ambulatory Visit (HOSPITAL_COMMUNITY): Payer: Medicare Other | Admitting: Anesthesiology

## 2011-12-05 ENCOUNTER — Encounter (HOSPITAL_COMMUNITY): Payer: Self-pay | Admitting: Anesthesiology

## 2011-12-05 ENCOUNTER — Encounter (HOSPITAL_COMMUNITY)
Admission: RE | Disposition: A | Discharge status: Home / Self Care | Payer: Self-pay | Source: Ambulatory Visit | Attending: Obstetrics and Gynecology

## 2011-12-05 ENCOUNTER — Ambulatory Visit (HOSPITAL_COMMUNITY)
Admission: RE | Admit: 2011-12-05 | Discharge: 2011-12-06 | Disposition: A | Discharge status: Home / Self Care | Payer: Medicare Other | Source: Ambulatory Visit | Attending: Obstetrics and Gynecology | Admitting: Obstetrics and Gynecology

## 2011-12-05 DIAGNOSIS — Z01812 Encounter for preprocedural laboratory examination: Secondary | ICD-10-CM | POA: Insufficient documentation

## 2011-12-05 DIAGNOSIS — N8111 Cystocele, midline: Secondary | ICD-10-CM

## 2011-12-05 DIAGNOSIS — Z01818 Encounter for other preprocedural examination: Secondary | ICD-10-CM | POA: Insufficient documentation

## 2011-12-05 DIAGNOSIS — N816 Rectocele: Secondary | ICD-10-CM

## 2011-12-05 DIAGNOSIS — N993 Prolapse of vaginal vault after hysterectomy: Secondary | ICD-10-CM | POA: Insufficient documentation

## 2011-12-05 HISTORY — PX: ANTERIOR AND POSTERIOR REPAIR: SHX5121

## 2011-12-05 SURGERY — ANTERIOR (CYSTOCELE) AND POSTERIOR REPAIR (RECTOCELE)
Anesthesia: General | Site: Vagina | Wound class: Clean Contaminated

## 2011-12-05 MED ORDER — ONDANSETRON HCL 4 MG/2ML IJ SOLN
INTRAMUSCULAR | Status: DC | PRN
  Administered 2011-12-05: 4 mg via INTRAVENOUS

## 2011-12-05 MED ORDER — GLYCOPYRROLATE 0.2 MG/ML IJ SOLN
INTRAMUSCULAR | Status: DC | PRN
  Administered 2011-12-05: 0.4 mg via INTRAVENOUS

## 2011-12-05 MED ORDER — LEVOTHYROXINE SODIUM 100 MCG PO TABS
100.0000 ug | ORAL_TABLET | Freq: Every morning | ORAL | Status: DC
  Administered 2011-12-06: 100 ug via ORAL
  Filled 2011-12-05 (×2): qty 1

## 2011-12-05 MED ORDER — LACTATED RINGERS IV SOLN
INTRAVENOUS | Status: DC
  Administered 2011-12-05 (×2): via INTRAVENOUS

## 2011-12-05 MED ORDER — FENTANYL CITRATE 0.05 MG/ML IJ SOLN
INTRAMUSCULAR | Status: DC | PRN
  Administered 2011-12-05 (×3): 50 ug via INTRAVENOUS

## 2011-12-05 MED ORDER — ROCURONIUM BROMIDE 100 MG/10ML IV SOLN
INTRAVENOUS | Status: DC | PRN
  Administered 2011-12-05: 30 mg via INTRAVENOUS

## 2011-12-05 MED ORDER — OXYCODONE-ACETAMINOPHEN 5-325 MG PO TABS
1.0000 | ORAL_TABLET | ORAL | Status: DC | PRN
  Administered 2011-12-05 – 2011-12-06 (×5): 1 via ORAL
  Filled 2011-12-05 (×5): qty 1

## 2011-12-05 MED ORDER — FENTANYL CITRATE 0.05 MG/ML IJ SOLN
25.0000 ug | INTRAMUSCULAR | Status: DC | PRN
  Administered 2011-12-05 (×2): 25 ug via INTRAVENOUS

## 2011-12-05 MED ORDER — DEXAMETHASONE SODIUM PHOSPHATE 4 MG/ML IJ SOLN
INTRAMUSCULAR | Status: DC | PRN
  Administered 2011-12-05: 4 mg via INTRAVENOUS

## 2011-12-05 MED ORDER — ONDANSETRON HCL 4 MG/2ML IJ SOLN
INTRAMUSCULAR | Status: AC
  Filled 2011-12-05: qty 2

## 2011-12-05 MED ORDER — ESTRADIOL 0.1 MG/GM VA CREA
TOPICAL_CREAM | VAGINAL | Status: DC | PRN
  Administered 2011-12-05: 1 via VAGINAL

## 2011-12-05 MED ORDER — MENTHOL 3 MG MT LOZG: 1.0000 | LOZENGE | OROMUCOSAL | Status: DC | PRN

## 2011-12-05 MED ORDER — HYDROCHLOROTHIAZIDE 25 MG PO TABS
25.0000 mg | ORAL_TABLET | Freq: Every morning | ORAL | Status: DC
  Filled 2011-12-05 (×2): qty 1

## 2011-12-05 MED ORDER — MIDAZOLAM HCL 2 MG/2ML IJ SOLN: 0.5000 mg | Freq: Once | INTRAMUSCULAR | Status: DC | PRN

## 2011-12-05 MED ORDER — NEOSTIGMINE METHYLSULFATE 1 MG/ML IJ SOLN
INTRAMUSCULAR | Status: DC | PRN
  Administered 2011-12-05: 2 mg via INTRAVENOUS

## 2011-12-05 MED ORDER — ACETAMINOPHEN 325 MG PO TABS: 325.0000 mg | ORAL_TABLET | ORAL | Status: DC | PRN

## 2011-12-05 MED ORDER — FENTANYL CITRATE 0.05 MG/ML IJ SOLN
INTRAMUSCULAR | Status: AC
  Administered 2011-12-05: 25 ug via INTRAVENOUS
  Filled 2011-12-05: qty 2

## 2011-12-05 MED ORDER — GLYCOPYRROLATE 0.2 MG/ML IJ SOLN
INTRAMUSCULAR | Status: AC
  Filled 2011-12-05: qty 1

## 2011-12-05 MED ORDER — MIDAZOLAM HCL 2 MG/2ML IJ SOLN
INTRAMUSCULAR | Status: AC
  Filled 2011-12-05: qty 2

## 2011-12-05 MED ORDER — LIDOCAINE HCL (CARDIAC) 20 MG/ML IV SOLN
INTRAVENOUS | Status: AC
  Filled 2011-12-05: qty 5

## 2011-12-05 MED ORDER — KETOROLAC TROMETHAMINE 30 MG/ML IJ SOLN: 15.0000 mg | Freq: Once | INTRAMUSCULAR | Status: DC | PRN

## 2011-12-05 MED ORDER — MEPERIDINE HCL 25 MG/ML IJ SOLN: 6.2500 mg | INTRAMUSCULAR | Status: DC | PRN

## 2011-12-05 MED ORDER — INDIGOTINDISULFONATE SODIUM 8 MG/ML IJ SOLN
INTRAMUSCULAR | Status: AC
  Filled 2011-12-05: qty 5

## 2011-12-05 MED ORDER — PROPOFOL 10 MG/ML IV EMUL
INTRAVENOUS | Status: AC
  Filled 2011-12-05: qty 20

## 2011-12-05 MED ORDER — VASOPRESSIN 20 UNIT/ML IJ SOLN
INTRAMUSCULAR | Status: AC
  Filled 2011-12-05: qty 1

## 2011-12-05 MED ORDER — LACTATED RINGERS IV SOLN
INTRAVENOUS | Status: DC
  Administered 2011-12-05: 20:00:00 via INTRAVENOUS

## 2011-12-05 MED ORDER — NEOSTIGMINE METHYLSULFATE 1 MG/ML IJ SOLN
INTRAMUSCULAR | Status: AC
  Filled 2011-12-05: qty 10

## 2011-12-05 MED ORDER — IBUPROFEN 600 MG PO TABS
600.0000 mg | ORAL_TABLET | Freq: Four times a day (QID) | ORAL | Status: DC | PRN
  Administered 2011-12-05 – 2011-12-06 (×2): 600 mg via ORAL
  Filled 2011-12-05 (×2): qty 1

## 2011-12-05 MED ORDER — INDIGOTINDISULFONATE SODIUM 8 MG/ML IJ SOLN
INTRAMUSCULAR | Status: DC | PRN
  Administered 2011-12-05: 5 mL via INTRAVENOUS

## 2011-12-05 MED ORDER — PROPOFOL 10 MG/ML IV EMUL
INTRAVENOUS | Status: DC | PRN
  Administered 2011-12-05: 50 mg via INTRAVENOUS
  Administered 2011-12-05: 120 mg via INTRAVENOUS

## 2011-12-05 MED ORDER — PROMETHAZINE HCL 25 MG/ML IJ SOLN
6.2500 mg | INTRAMUSCULAR | Status: DC | PRN
  Administered 2011-12-05: 6.25 mg via INTRAVENOUS

## 2011-12-05 MED ORDER — KETOROLAC TROMETHAMINE 30 MG/ML IJ SOLN
INTRAMUSCULAR | Status: DC | PRN
  Administered 2011-12-05: 15 mg via INTRAVENOUS

## 2011-12-05 MED ORDER — ESTRADIOL 0.1 MG/GM VA CREA
TOPICAL_CREAM | VAGINAL | Status: AC
  Filled 2011-12-05: qty 42.5

## 2011-12-05 MED ORDER — PROMETHAZINE HCL 25 MG/ML IJ SOLN
INTRAMUSCULAR | Status: AC
  Administered 2011-12-05: 6.25 mg via INTRAVENOUS
  Filled 2011-12-05: qty 1

## 2011-12-05 MED ORDER — LIDOCAINE HCL (CARDIAC) 20 MG/ML IV SOLN
INTRAVENOUS | Status: DC | PRN
  Administered 2011-12-05: 40 mg via INTRAVENOUS

## 2011-12-05 MED ORDER — VASOPRESSIN 20 UNIT/ML IJ SOLN
INTRAVENOUS | Status: DC | PRN
  Administered 2011-12-05: 10:00:00 via INTRAMUSCULAR

## 2011-12-05 MED ORDER — DEXAMETHASONE SODIUM PHOSPHATE 10 MG/ML IJ SOLN
INTRAMUSCULAR | Status: AC
  Filled 2011-12-05: qty 1

## 2011-12-05 MED ORDER — FENTANYL CITRATE 0.05 MG/ML IJ SOLN
INTRAMUSCULAR | Status: AC
  Filled 2011-12-05: qty 5

## 2011-12-05 MED ORDER — ROCURONIUM BROMIDE 50 MG/5ML IV SOLN
INTRAVENOUS | Status: AC
  Filled 2011-12-05: qty 1

## 2011-12-05 MED ORDER — MIDAZOLAM HCL 5 MG/5ML IJ SOLN
INTRAMUSCULAR | Status: DC | PRN
  Administered 2011-12-05: 1 mg via INTRAVENOUS

## 2011-12-05 SURGICAL SUPPLY — 29 items
CANISTER SUCTION 2500CC (MISCELLANEOUS) ×2 IMPLANT
CLOTH BEACON ORANGE TIMEOUT ST (SAFETY) ×2 IMPLANT
CONT PATH 16OZ SNAP LID 3702 (MISCELLANEOUS) IMPLANT
DECANTER SPIKE VIAL GLASS SM (MISCELLANEOUS) ×1 IMPLANT
DRAPE PROXIMA HALF (DRAPES) ×2 IMPLANT
DRAPE STERI URO 9X17 APER PCH (DRAPES) ×2 IMPLANT
GAUZE PACKING 2X5 YD STERILE (GAUZE/BANDAGES/DRESSINGS) ×2 IMPLANT
GLOVE BIO SURGEON STRL SZ7.5 (GLOVE) ×4 IMPLANT
GLOVE BIOGEL PI IND STRL 7.5 (GLOVE) ×1 IMPLANT
GLOVE BIOGEL PI INDICATOR 7.5 (GLOVE) ×1
GOWN PREVENTION PLUS LG XLONG (DISPOSABLE) ×6 IMPLANT
GOWN STRL REIN XL XLG (GOWN DISPOSABLE) ×2 IMPLANT
NEEDLE HYPO 22GX1.5 SAFETY (NEEDLE) ×2 IMPLANT
NS IRRIG 1000ML POUR BTL (IV SOLUTION) ×2 IMPLANT
PACK VAGINAL WOMENS (CUSTOM PROCEDURE TRAY) ×2 IMPLANT
SET CYSTO W/LG BORE CLAMP LF (SET/KITS/TRAYS/PACK) ×2 IMPLANT
SUT VIC AB 0 CT1 18XCR BRD8 (SUTURE) IMPLANT
SUT VIC AB 0 CT1 8-18 (SUTURE) ×2
SUT VIC AB 1 CT1 36 (SUTURE) IMPLANT
SUT VIC AB 2-0 CT1 27 (SUTURE)
SUT VIC AB 2-0 CT1 TAPERPNT 27 (SUTURE) IMPLANT
SUT VIC AB 2-0 SH 27 (SUTURE) ×2
SUT VIC AB 2-0 SH 27XBRD (SUTURE) IMPLANT
SUT VIC AB 3-0 SH 27 (SUTURE) ×24
SUT VIC AB 3-0 SH 27X BRD (SUTURE) IMPLANT
SUT VIC AB 3-0 SH 27XBRD (SUTURE) IMPLANT
TOWEL OR 17X24 6PK STRL BLUE (TOWEL DISPOSABLE) ×4 IMPLANT
TRAY FOLEY CATH 14FR (SET/KITS/TRAYS/PACK) ×2 IMPLANT
WATER STERILE IRR 1000ML POUR (IV SOLUTION) ×2 IMPLANT

## 2011-12-05 NOTE — H&P (View-Only) (Signed)
Tina Calderon is a 70 y.o. female G2P2002 who presents for an anterior-posterior colporrhaphy because of symptomatic pelvic relaxation.  Since patient's hysterectomy and "bladder tack"  in 1992 she has noticed a gradual increase in pelvic pressure and lower back pain.  Over the past year, this has been accompanied by a bulging sensation in her vaginal area. She denies any urinary incontinence, retention, problems with defecation (unless she becomes constipated and has to press on her perineum to assist evacuation) or feelings of an incomplete emptying of her bladder.  She was fitted with a ring pessary earlier this month however, she actually developed a problem  with incontinence after if was placed.  Two days later it was removed and patient was discovered to have a urinary tract infection.  In spite of this fact, patient has decided that she no longer wants to were a pessary and would like surgery to repair her pelvic relaxation.  Past Medical History  OB History: G2P2002 SVD, 1960 and 1977  GYN History: menarche  71 YO  LMP 1992 (S/P Hysterectomy);   The patient denies history of sexually transmitted disease.  Denies history of abnormal PAP smear    Medical History: Right arm fracture, hypertension and thyroid disease  Surgical History: 1946-Tonsillectomy;  1992-Hysterectomy with "Bladder Tack";  2001-Right Shoulder;  2002-2010-bilateral foot surgeries (bunions/hammertoes)'  2003-Neck Fusion C 5-6-7 Denies problems with anesthesia or history of blood transfusions  Family History: Cancer, hypertension and stroke  Social History:  Married; Denies tobacco, alcohol or illicit drug use.   Outpatient Encounter Prescriptions as of 11/29/2011  Medication Sig Dispense Refill  . calcium-vitamin D (OSCAL WITH D) 500-200 MG-UNIT per tablet Take 1 tablet by mouth every morning.      . hydrochlorothiazide (HYDRODIURIL) 25 MG tablet Take 25 mg by mouth every morning.       . levothyroxine (SYNTHROID,  LEVOTHROID) 100 MCG tablet Take 100 mcg by mouth every morning.       . loratadine (CLARITIN) 10 MG tablet Take 10 mg by mouth every evening.      . Multiple Vitamin (MULTIVITAMIN WITH MINERALS) TABS Take 1 tablet by mouth every morning. Centrum silver        No Known Allergies and denies sensitivity to latex peanuts, shellfish, soy and band-aids.  ROS: + glasses, upper and lower jaw dentures bu denies headache, vision changes, dysphagia, tinnitus, dizziness,  chest pain, shortness of breath, nausea, vomiting, diarrhea, dysuria, hematuria, pelvic pain, swelling of joints,easy bruising,  myalgias, arthralgias, skin rashes and except as is mentioned in the history of present illness, patient's review of systems is otherwise negative  Physical Exam    BP 128/80  Pulse 76  Temp 98.1 F (36.7 C) (Oral)  Resp 16  Ht 5' 2.5" (1.588 m)  Wt 140 lb (63.504 kg)  BMI 25.20 kg/m2  Neck: supple without masses or thyromegaly Lungs: clear to auscultation Heart: regular rate and rhythm Abdomen: soft, non-tender and no organomegaly Pelvic:EGBUS- wnl; vagina- 3/4 cystocele and 2/4 rectocele uterus and cervix are surgically absent. Extremities:  no clubbing, cyanosis or edema   Assesment: Symptomatic Pelvic Relaxation                      S/P Hysterectomy/Bladder Surgery   Disposition:  A discussion was held with patient regarding the indication for her procedure(s) along with the risks, which include but are not limited to: reaction to anesthesia, damage to adjacent organs, infection, excessive bleeding. worsening of symptoms   and that the surgery may not alleviate her symptoms..  Patient verbalized understanding of these risks and has consented to proceed with anterior-posterior colporrhaphy and cystoscopy at [facility], June 27 at 9 a.m.  CSN# [id number]   Tina Calderon J. Caetano Oberhaus, PA-C  for Dr. Angela Y. Roberts  

## 2011-12-05 NOTE — Op Note (Signed)
Preop Diagnosis: Symptomatic Cystocele and Rectocele   Postop Diagnosis: symptomatic cystocele, rectocele   Procedure: ANTERIOR (CYSTOCELE) AND POSTERIOR REPAIR (RECTOCELE)   Anesthesia: General   Anesthesiologist: Rudean Curt, MD  Attending: [name], MD   Assistant: Earnstine Regal, [facility]  Findings: cystocele and rectocele  Pathology: n/a  Fluids: see flowsheet  UOP: see flowsheet  EBL: see flowsheet  Complications: None  Procedure:The patient was taken to the operating room after the risks, benefits and alternatives were discussed with the patient, the patient verbalized understanding and consent signed and witnessed.  A timeout was performed per protocol.  The patient was prepped and draped in the normal sterile fashion in the dorsal lithotomy position and the patient was placed under general anesthesia per the anesthesiologist. A weighted speculum was placed in the patient's vagina and the anterior vaginal wall was retracted using Deaver retractors.  Dilute pitressin was injected into the anterior vaginal wall.  An incision was made and the underlying tissue dissected away from the anterior vaginal wall. The cystocele was repaired using Kelly plication stitches of 3-0 Vicryl.  The overlying anterior vaginal wall was then repaired using 2-0 Vicryl via interrupted stitches. Attention was then turned to the posterior vaginal wall where dilute Pitressin was injected. The posterior vaginal wall was incised and the underlying tissue was dissected away from the posterior vaginal wall. The rectocele was repaired using plication stitches of 2-0 Vicryl.  The overlying posterior vaginal wall tissue was repaired with 2-0 Vicryl via a running interlocking stitch. The perineum was repaired with 2-0 Vicryl via subcutaneous stitch. Cystoscopy was performed and bilateral ureters were noted to efflux without difficulty and there were no inadvertent bladder injuries. The vagina was packed with  estrogen-soaked packing.  The patient tolerated the procedure well and was awaiting return to the recovery room in good condition.

## 2011-12-05 NOTE — Transfer of Care (Signed)
Immediate Anesthesia Transfer of Care Note  Patient: Tina Calderon  Procedure(s) Performed: Procedure(s) (LRB): ANTERIOR (CYSTOCELE) AND POSTERIOR REPAIR (RECTOCELE) (N/A)  Patient Location: PACU  Anesthesia Type: General  Level of Consciousness: sedated and patient cooperative  Airway & Oxygen Therapy: Patient Spontanous Breathing and Patient connected to nasal cannula oxygen  Post-op Assessment: Report given to PACU RN and Post -op Vital signs reviewed and stable  Post vital signs: Reviewed and stable  Complications: No apparent anesthesia complications

## 2011-12-05 NOTE — Interval H&P Note (Signed)
History and Physical Interval Note:  12/05/2011 8:57 AM  Tina Calderon  has presented today for surgery, with the diagnosis of Symptomatic Cystocele and Rectocele  The various methods of treatment have been discussed with the patient and family. After consideration of risks, benefits and other options for treatment, the patient has consented to  Procedure(s) (LRB): ANTERIOR (CYSTOCELE) AND POSTERIOR REPAIR (RECTOCELE) (N/A) as a surgical intervention .  The patient's history has been reviewed, patient examined, no change in status, stable for surgery.  I have reviewed the patients' chart and labs.  Questions were answered to the patient's satisfaction.  Risks, Benefits and Alternatives reviewed.  I also explained the potential risk of unmasking urinary incontinence especially since the patient had episode of incontinence with pessary in place.  Questions answered and consent signed and witnessed.   [name]

## 2011-12-05 NOTE — Anesthesia Postprocedure Evaluation (Signed)
Anesthesia Post Note  Patient: Tina Calderon  Procedure(s) Performed: Procedure(s) (LRB): ANTERIOR (CYSTOCELE) AND POSTERIOR REPAIR (RECTOCELE) (N/A)  Anesthesia type: GA  Patient location: PACU  Post pain: Pain level controlled  Post assessment: Post-op Vital signs reviewed  Last Vitals:  Filed Vitals:   12/05/11 1200  BP: 138/70  Pulse: 79  Temp:   Resp: 11    Post vital signs: Reviewed  Level of consciousness: sedated  Complications: No apparent anesthesia complications

## 2011-12-05 NOTE — Anesthesia Preprocedure Evaluation (Signed)
Anesthesia Evaluation  Patient identified by MRN, date of birth, ID band Patient awake    Reviewed: Allergy & Precautions, H&P , Patient's Chart, lab work & pertinent test results, reviewed documented beta blocker date and time   History of Anesthesia Complications Negative for: history of anesthetic complications  Airway Mallampati: II TM Distance: >3 FB Neck ROM: full    Dental No notable dental hx.    Pulmonary neg pulmonary ROS,  breath sounds clear to auscultation  Pulmonary exam normal       Cardiovascular Exercise Tolerance: Good hypertension, negative cardio ROS  Rhythm:regular Rate:Normal     Neuro/Psych negative neurological ROS  negative psych ROS   GI/Hepatic negative GI ROS, Neg liver ROS,   Endo/Other  negative endocrine ROS  Renal/GU negative Renal ROS     Musculoskeletal   Abdominal   Peds  Hematology negative hematology ROS (+)   Anesthesia Other Findings Thyroid disease     Hypertension        Prolapse of bladder     Pelvic relaxation        Hypothyroidism    Reproductive/Obstetrics negative OB ROS                           Anesthesia Physical Anesthesia Plan  ASA: II  Anesthesia Plan: General ETT   Post-op Pain Management:    Induction:   Airway Management Planned:   Additional Equipment:   Intra-op Plan:   Post-operative Plan:   Informed Consent: I have reviewed the patients History and Physical, chart, labs and discussed the procedure including the risks, benefits and alternatives for the proposed anesthesia with the patient or authorized representative who has indicated his/her understanding and acceptance.   Dental Advisory Given  Plan Discussed with: CRNA and Surgeon  Anesthesia Plan Comments:         Anesthesia Quick Evaluation

## 2011-12-06 ENCOUNTER — Encounter (HOSPITAL_COMMUNITY): Payer: Self-pay | Admitting: Obstetrics and Gynecology

## 2011-12-06 LAB — CBC
Hemoglobin: 10.3 g/dL — ABNORMAL LOW (ref 12.0–15.0)
MCH: 26.5 pg (ref 26.0–34.0)
MCHC: 32.3 g/dL (ref 30.0–36.0)
MCV: 82 fL (ref 78.0–100.0)
RBC: 3.89 MIL/uL (ref 3.87–5.11)
RDW: 15.7 % — ABNORMAL HIGH (ref 11.5–15.5)
WBC: 9.2 10*3/uL (ref 4.0–10.5)

## 2011-12-06 MED ORDER — OXYCODONE-ACETAMINOPHEN 5-325 MG PO TABS: 1.0000 | ORAL_TABLET | Freq: Four times a day (QID) | ORAL | Status: AC | PRN

## 2011-12-06 MED ORDER — IBUPROFEN 600 MG PO TABS: 600.0000 mg | ORAL_TABLET | Freq: Four times a day (QID) | ORAL | Status: AC | PRN

## 2011-12-06 MED ORDER — PROMETHAZINE HCL 12.5 MG PO TABS: 12.5000 mg | ORAL_TABLET | Freq: Four times a day (QID) | ORAL | Status: DC | PRN

## 2011-12-06 NOTE — Progress Notes (Signed)
Tina Calderon is a36 y.o.  [id number]  Post Op Date # 1  Subjective: Patient is Doing well postoperatively. Patient has minimal pain which is controlled by oral analgesia., Patient is ambulating in the halls without difficulty, tolerating a regular diet and voiding without complaints or difficulty.  Objective: Vital signs in last 24 hours: Temp:  [97.3 F (36.3 C)-99.8 F (37.7 C)] 97.7 F (36.5 C) (06/28 0600) Pulse Rate:  [54-91] 54  (06/28 0600) Resp:  [14-18] 17  (06/28 0600) BP: (100-144)/(56-88) 112/72 mmHg (06/28 0600) SpO2:  [93 %-100 %] 100 % (06/28 0600) Weight:  [140 lb (63.504 kg)] 140 lb (63.504 kg) (06/27 1330)  Intake/Output from previous day: 06/27 0701 - 06/28 0700 In: 2600 [P.O.:600; I.V.:2000] Out: 1550 [Urine:1475] Intake/Output this shift:    Lab 12/06/11 0530 11/29/11 0904  WBC 9.2 6.5  HGB 10.3* 12.8  HCT 31.9* 39.9  PLT 149* 190     Lab 11/29/11 0904  NA 138  K 4.0  CL 100  CO2 29  BUN 20  CREATININE 0.82  CALCIUM 9.8  PROT --  BILITOT --  ALKPHOS --  ALT --  AST --  GLUCOSE 82    EXAM: General: alert, cooperative and no distress Resp: clear to auscultation bilaterally Cardio: regular rate and rhythm, S1, S2 normal, no murmur, click, rub or gallop GI: soft, non-tender; bowel sounds normal; no masses,  no organomegaly Extremities: Homans sign is negative, no sign of DVT SCD hose in place and functioning Vaginal packing, per R.N. with dark blood stain   Assessment: s/p Procedure(s): ANTERIOR (CYSTOCELE) AND POSTERIOR REPAIR (RECTOCELE): stable, progressing well and anemia  Plan: Discharge home today  LOS: 1 day    Wilmon Conover, PA-C 12/06/2011 8:08 AM

## 2011-12-06 NOTE — Discharge Instructions (Signed)
Call [facility] @ [phone] if:  You have a temperature greater than or equal to 100.4 degrees Farenheit orally You have pain that is not made better by the pain medication given and taken as directed You have excessive bleeding or problems urinating  Take Colace (Docusate Sodium/Stool Softener) 100 mg 2-3 times daily while taking narcotic pain medicine to avoid constipation or until bowel movements are regular.  You may walk up steps You may shower tomorrow You may resume a regular diet  Do not lift over 15 pounds for 6 weeks Avoid anything in vagina for 6 weeks (or until after your post-operative visit)   Follow up with Dr. Mancel Bale  January 15, 2012 @ 10:30 a.m.

## 2011-12-06 NOTE — Discharge Summary (Signed)
Physician Discharge Summary  Patient ID: Tina Calderon MRN: [id number] DOB/AGE: Apr 22, 1941 71 y.o.  Admit date: 12/05/2011 Discharge date: 12/06/2011   Discharge Diagnoses: Pelvic Relaxation, Cystocele and Rectocdld Active Problems:  * No active hospital problems. *    Operation: Anterior and Posterior Colporhaphy   Discharged Condition: stable  Hospital Course: On the date of admission the patient underwent aforementioned procedure and tolerated it well.  By post operative day #1 she had resumed bowel and bladder function, was ambulating and tolerating a H/H of 10.3/31.9 so therefore was deemed ready for discharge home.  Disposition: Home to self care  Discharge Medications:   [name], [name]  Home Medication Instructions HEK:[id number]   Printed on:12/06/11 0759  Medication Information                    levothyroxine (SYNTHROID, LEVOTHROID) 100 MCG tablet Take 100 mcg by mouth every morning.            hydrochlorothiazide (HYDRODIURIL) 25 MG tablet Take 25 mg by mouth every morning.            loratadine (CLARITIN) 10 MG tablet Take 10 mg by mouth every evening.           Multiple Vitamin (MULTIVITAMIN WITH MINERALS) TABS Take 1 tablet by mouth every morning. Centrum silver           calcium-vitamin D (OSCAL WITH D) 500-200 MG-UNIT per tablet Take 1 tablet by mouth every morning.           ibuprofen (ADVIL,MOTRIN) 600 MG tablet Take 1 tablet (600 mg total) by mouth every 6 (six) hours as needed for pain (mild pain).           oxyCODONE-acetaminophen (PERCOCET) 5-325 MG per tablet Take 1 tablet by mouth every 6 (six) hours as needed for pain (moderate to severe pain (when tolerating fluids)).           promethazine (PHENERGAN) 12.5 MG tablet Take 1 tablet (12.5 mg total) by mouth every 6 (six) hours as needed for nausea.                Follow-up:  Dr. Mancel Bale, January 15, 2012 @ 10:15 a.m.    SignedEarnstine Regal, PA-C 12/06/2011, 7:59 AM

## 2011-12-15 NOTE — Progress Notes (Signed)
POD#0 s/p A-P Repair and Cystoscopy  S: No complaints O: AVSS Lungs CTA bil with slight crackles at bases CV RRR Abd soft, NT, ND, NABS Ext no calf tenderness, SCDs are on Vaginal packing in place  A/P Recovering appropriately Routine postop care Labs in am SCDs for DVT prophylaxis Good UOP Decrease IVF to 100cc/hr

## 2011-12-17 ENCOUNTER — Encounter: Payer: Medicare Other | Admitting: Obstetrics and Gynecology

## 2012-01-15 ENCOUNTER — Ambulatory Visit (INDEPENDENT_AMBULATORY_CARE_PROVIDER_SITE_OTHER): Payer: Medicare Other | Admitting: Obstetrics and Gynecology

## 2012-01-15 ENCOUNTER — Encounter: Payer: Self-pay | Admitting: Obstetrics and Gynecology

## 2012-01-15 ENCOUNTER — Telehealth: Payer: Self-pay | Admitting: Obstetrics and Gynecology

## 2012-01-15 VITALS — BP 136/68 | Resp 16 | Ht 62.5 in | Wt 139.0 lb

## 2012-01-15 DIAGNOSIS — R350 Frequency of micturition: Secondary | ICD-10-CM

## 2012-01-15 DIAGNOSIS — N949 Unspecified condition associated with female genital organs and menstrual cycle: Secondary | ICD-10-CM

## 2012-01-15 DIAGNOSIS — N898 Other specified noninflammatory disorders of vagina: Secondary | ICD-10-CM

## 2012-01-15 DIAGNOSIS — Z09 Encounter for follow-up examination after completed treatment for conditions other than malignant neoplasm: Secondary | ICD-10-CM

## 2012-01-15 LAB — POCT WET PREP (WET MOUNT)
Clue Cells Wet Prep Whiff POC: POSITIVE
Trichomonas Wet Prep HPF POC: NEGATIVE

## 2012-01-15 MED ORDER — TINIDAZOLE 500 MG PO TABS
2.0000 g | ORAL_TABLET | Freq: Every day | ORAL | Status: DC
Start: 2012-01-15 — End: 2012-01-20

## 2012-01-15 NOTE — Progress Notes (Signed)
DATE OF SURGERY: 06/27/20123 TYPE OF SURGERY:A&P repair/ cystoscopy PAIN:Yes  slightly VAG BLEEDING: no VAG DISCHARGE: yes NORMAL GI FUNCTN: yes NORMAL GU FUNCTN: yes  Reports voiding a little frequently and slight discharge  Filed Vitals:   01/15/12 0914  BP: 136/68  Resp: 16   ROS: noncontributory  Pelvic exam:  VULVA: normal appearing vulva with no masses, tenderness or lesions,  VAGINA: normal appearing vagina with normal color and discharge, no lesions, well healed few stitches remain ADNEXA: normal adnexa in size, nontender and no masses.  A/P Wet prep - BV - Rx Tindamax UCx RTO 42yr

## 2012-01-15 NOTE — Telephone Encounter (Signed)
Jackie/pam/ar pt.

## 2012-01-17 LAB — URINE CULTURE
Colony Count: NO GROWTH
Organism ID, Bacteria: NO GROWTH

## 2012-01-20 ENCOUNTER — Encounter (HOSPITAL_COMMUNITY): Payer: Self-pay | Admitting: Radiology

## 2012-01-20 ENCOUNTER — Emergency Department (HOSPITAL_COMMUNITY): Payer: Medicare Other

## 2012-01-20 ENCOUNTER — Inpatient Hospital Stay (HOSPITAL_COMMUNITY)
Admission: EM | Admit: 2012-01-20 | Discharge: 2012-01-24 | DRG: 189 | Disposition: A | Discharge status: Home / Self Care | Payer: Medicare Other | Attending: Internal Medicine | Admitting: Internal Medicine

## 2012-01-20 DIAGNOSIS — R609 Edema, unspecified: Secondary | ICD-10-CM | POA: Diagnosis present

## 2012-01-20 DIAGNOSIS — Z9889 Other specified postprocedural states: Secondary | ICD-10-CM

## 2012-01-20 DIAGNOSIS — R0902 Hypoxemia: Secondary | ICD-10-CM

## 2012-01-20 DIAGNOSIS — E781 Pure hyperglyceridemia: Secondary | ICD-10-CM

## 2012-01-20 DIAGNOSIS — D649 Anemia, unspecified: Secondary | ICD-10-CM | POA: Diagnosis present

## 2012-01-20 DIAGNOSIS — Z981 Arthrodesis status: Secondary | ICD-10-CM

## 2012-01-20 DIAGNOSIS — I951 Orthostatic hypotension: Secondary | ICD-10-CM | POA: Diagnosis not present

## 2012-01-20 DIAGNOSIS — J811 Chronic pulmonary edema: Secondary | ICD-10-CM

## 2012-01-20 DIAGNOSIS — I501 Left ventricular failure: Secondary | ICD-10-CM

## 2012-01-20 DIAGNOSIS — E079 Disorder of thyroid, unspecified: Secondary | ICD-10-CM

## 2012-01-20 DIAGNOSIS — J9600 Acute respiratory failure, unspecified whether with hypoxia or hypercapnia: Principal | ICD-10-CM | POA: Insufficient documentation

## 2012-01-20 DIAGNOSIS — J0100 Acute maxillary sinusitis, unspecified: Secondary | ICD-10-CM

## 2012-01-20 DIAGNOSIS — I5033 Acute on chronic diastolic (congestive) heart failure: Secondary | ICD-10-CM

## 2012-01-20 DIAGNOSIS — R079 Chest pain, unspecified: Secondary | ICD-10-CM | POA: Diagnosis present

## 2012-01-20 DIAGNOSIS — E039 Hypothyroidism, unspecified: Secondary | ICD-10-CM | POA: Diagnosis present

## 2012-01-20 DIAGNOSIS — I10 Essential (primary) hypertension: Secondary | ICD-10-CM | POA: Diagnosis present

## 2012-01-20 DIAGNOSIS — R918 Other nonspecific abnormal finding of lung field: Secondary | ICD-10-CM | POA: Diagnosis present

## 2012-01-20 DIAGNOSIS — N8189 Other female genital prolapse: Secondary | ICD-10-CM

## 2012-01-20 DIAGNOSIS — Z79899 Other long term (current) drug therapy: Secondary | ICD-10-CM

## 2012-01-20 DIAGNOSIS — J9691 Respiratory failure, unspecified with hypoxia: Secondary | ICD-10-CM | POA: Diagnosis present

## 2012-01-20 DIAGNOSIS — J0120 Acute ethmoidal sinusitis, unspecified: Secondary | ICD-10-CM

## 2012-01-20 DIAGNOSIS — Z8249 Family history of ischemic heart disease and other diseases of the circulatory system: Secondary | ICD-10-CM

## 2012-01-20 DIAGNOSIS — I509 Heart failure, unspecified: Secondary | ICD-10-CM

## 2012-01-20 DIAGNOSIS — E785 Hyperlipidemia, unspecified: Secondary | ICD-10-CM

## 2012-01-20 HISTORY — DX: Malignant (primary) neoplasm, unspecified: C80.1

## 2012-01-20 HISTORY — DX: Angina pectoris, unspecified: I20.9

## 2012-01-20 HISTORY — DX: Shortness of breath: R06.02

## 2012-01-20 HISTORY — DX: Heart failure, unspecified: I50.9

## 2012-01-20 LAB — BASIC METABOLIC PANEL
BUN: 18 mg/dL (ref 6–23)
CO2: 25 mEq/L (ref 19–32)
Calcium: 9 mg/dL (ref 8.4–10.5)
Chloride: 103 mEq/L (ref 96–112)
Glucose, Bld: 102 mg/dL — ABNORMAL HIGH (ref 70–99)
Potassium: 3.9 mEq/L (ref 3.5–5.1)

## 2012-01-20 LAB — CBC WITH DIFFERENTIAL/PLATELET
Eosinophils Absolute: 0.3 10*3/uL (ref 0.0–0.7)
Hemoglobin: 10.2 g/dL — ABNORMAL LOW (ref 12.0–15.0)
Lymphocytes Relative: 22 % (ref 12–46)
Lymphs Abs: 1.4 10*3/uL (ref 0.7–4.0)
MCH: 27.6 pg (ref 26.0–34.0)
Monocytes Relative: 12 % (ref 3–12)
Neutro Abs: 4 10*3/uL (ref 1.7–7.7)
Neutrophils Relative %: 61 % (ref 43–77)
RBC: 3.7 MIL/uL — ABNORMAL LOW (ref 3.87–5.11)
RDW: 14.9 % (ref 11.5–15.5)

## 2012-01-20 LAB — PROTIME-INR
INR: 1.14 (ref 0.00–1.49)
Prothrombin Time: 14.8 seconds (ref 11.6–15.2)

## 2012-01-20 LAB — MRSA PCR SCREENING: MRSA by PCR: NEGATIVE

## 2012-01-20 LAB — HEMOGLOBIN A1C
Hgb A1c MFr Bld: 6.1 % — ABNORMAL HIGH (ref <5.7)
Mean Plasma Glucose: 128 mg/dL — ABNORMAL HIGH (ref <117)

## 2012-01-20 LAB — CARDIAC PANEL(CRET KIN+CKTOT+MB+TROPI)
CK, MB: 1.8 ng/mL (ref 0.3–4.0)
Relative Index: INVALID (ref 0.0–2.5)
Relative Index: INVALID (ref 0.0–2.5)
Total CK: 57 U/L (ref 7–177)
Total CK: 72 U/L (ref 7–177)
Troponin I: <0.3 ng/mL (ref <0.30)

## 2012-01-20 LAB — POCT I-STAT TROPONIN I

## 2012-01-20 MED ORDER — FUROSEMIDE 10 MG/ML IJ SOLN
20.0000 mg | Freq: Two times a day (BID) | INTRAMUSCULAR | Status: DC
  Administered 2012-01-20: 20:00:00 via INTRAVENOUS
  Administered 2012-01-21 – 2012-01-23 (×5): 20 mg via INTRAVENOUS
  Filled 2012-01-20 (×6): qty 2

## 2012-01-20 MED ORDER — ACETAMINOPHEN 325 MG PO TABS
650.0000 mg | ORAL_TABLET | ORAL | Status: DC | PRN
  Administered 2012-01-21 – 2012-01-22 (×3): 650 mg via ORAL
  Filled 2012-01-20 (×2): qty 2

## 2012-01-20 MED ORDER — NITROGLYCERIN IN D5W 200-5 MCG/ML-% IV SOLN
5.0000 ug/min | INTRAVENOUS | Status: DC
  Administered 2012-01-20 (×3): 5 ug/min via INTRAVENOUS

## 2012-01-20 MED ORDER — HYDROMORPHONE HCL PF 1 MG/ML IJ SOLN
1.0000 mg | Freq: Once | INTRAMUSCULAR | Status: AC
  Administered 2012-01-20: 1 mg via INTRAVENOUS
  Filled 2012-01-20: qty 1

## 2012-01-20 MED ORDER — NITROGLYCERIN IN D5W 200-5 MCG/ML-% IV SOLN
INTRAVENOUS | Status: AC
  Filled 2012-01-20: qty 250

## 2012-01-20 MED ORDER — SODIUM CHLORIDE 0.9 % IV SOLN: INTRAVENOUS | Status: DC

## 2012-01-20 MED ORDER — ASPIRIN EC 81 MG PO TBEC
81.0000 mg | DELAYED_RELEASE_TABLET | Freq: Every day | ORAL | Status: DC
  Administered 2012-01-21 – 2012-01-24 (×4): 81 mg via ORAL
  Filled 2012-01-20 (×4): qty 1

## 2012-01-20 MED ORDER — SODIUM CHLORIDE 0.9 % IV SOLN
250.0000 mL | INTRAVENOUS | Status: DC | PRN
  Administered 2012-01-20: 250 mL via INTRAVENOUS

## 2012-01-20 MED ORDER — SODIUM CHLORIDE 0.9 % IV SOLN
INTRAVENOUS | Status: DC
  Administered 2012-01-20: 22:00:00 via INTRAVENOUS

## 2012-01-20 MED ORDER — SODIUM CHLORIDE 0.9 % IJ SOLN: 3.0000 mL | INTRAMUSCULAR | Status: DC | PRN

## 2012-01-20 MED ORDER — ASPIRIN 81 MG PO CHEW: 324.0000 mg | CHEWABLE_TABLET | ORAL | Status: DC

## 2012-01-20 MED ORDER — NITROGLYCERIN 0.4 MG SL SUBL: 0.4000 mg | SUBLINGUAL_TABLET | SUBLINGUAL | Status: DC | PRN

## 2012-01-20 MED ORDER — HEPARIN BOLUS VIA INFUSION
4000.0000 [IU] | Freq: Once | INTRAVENOUS | Status: AC
  Administered 2012-01-20: 4000 [IU] via INTRAVENOUS

## 2012-01-20 MED ORDER — REGADENOSON 0.4 MG/5ML IV SOLN
0.4000 mg | Freq: Once | INTRAVENOUS | Status: AC
  Administered 2012-01-21: 0.4 mg via INTRAVENOUS
  Filled 2012-01-20: qty 5

## 2012-01-20 MED ORDER — SODIUM CHLORIDE 0.9 % IJ SOLN: 3.0000 mL | Freq: Two times a day (BID) | INTRAMUSCULAR | Status: DC

## 2012-01-20 MED ORDER — IOHEXOL 350 MG/ML SOLN
100.0000 mL | Freq: Once | INTRAVENOUS | Status: AC | PRN
  Administered 2012-01-20: 100 mL via INTRAVENOUS

## 2012-01-20 MED ORDER — ONDANSETRON HCL 4 MG/2ML IJ SOLN
4.0000 mg | Freq: Four times a day (QID) | INTRAMUSCULAR | Status: DC | PRN
  Administered 2012-01-20: 4 mg via INTRAVENOUS
  Filled 2012-01-20: qty 2

## 2012-01-20 MED ORDER — ASPIRIN 300 MG RE SUPP: 300.0000 mg | RECTAL | Status: DC

## 2012-01-20 MED ORDER — ENOXAPARIN SODIUM 40 MG/0.4ML ~~LOC~~ SOLN
40.0000 mg | Freq: Every day | SUBCUTANEOUS | Status: DC
  Administered 2012-01-20 – 2012-01-22 (×3): 40 mg via SUBCUTANEOUS
  Filled 2012-01-20 (×3): qty 0.4

## 2012-01-20 MED ORDER — HEPARIN (PORCINE) IN NACL 100-0.45 UNIT/ML-% IJ SOLN
1000.0000 [IU]/h | INTRAMUSCULAR | Status: DC
  Administered 2012-01-20: 1000 [IU]/h via INTRAVENOUS
  Filled 2012-01-20 (×2): qty 250

## 2012-01-20 NOTE — H&P (Signed)
[facility] History and Physical  [name] Calderon:[id number] DOB: 02/22/1941 DOA: 01/20/2012  Referring physician: ED physician PCP: [name], MD   Chief Complaint: Chest pain and shortness of breath  HPI:  This is a 71 year old pleasant female who presented with 2-3 months of chest discomfort and shortness of breath on exertion. The significantly worsened on Sunday when she tried to take the trash can approximately 150 feet outside her house. The patient states that she had to stop 3 times due to experiencing chest discomfort and sugars of breath. Over the past 3 months, the patient has been complaining of chest discomfort and shortness of breath even when she takes a shower. She waited until today to go to her primary care provider's office at which time the patient was brought to the emergency department by EMS. The patient was given nitroglycerin sublingual spray with some relief of her chest discomfort. The patient also received aspirin in route. In emergency department, the patient was placed on a heparin drip as well as nitroglycerin drip. Cardiology has been consulted. Troponins are negative x1. EKG did not show any ST-T changes. The patient remained hemodynamic stable at this time. She states that her chest discomfort went from 6 to a 2/10. Of note, there is no family history of premature coronary artery disease. There is no history of tobacco. In the past 3 months, the patient hasn't had intermittent dizziness with her chest discomfort but no syncope. She denies any orthopnea or PND. She denies any increased abdominal girth or pedal edema.  Assessment and plan   Angina -Troponin is negative, and there is no ST T-wave changes. -CT of the chest was negative -Cardiology has been consulted. -Discussed with cardiology at the bedside-they plan to discontinue nitro drip and heparin drip at this time -Cardiology plans LexiScan in the morning. -Will check lipids, TSH, hemoglobin  A1c Pulmonary edema -We'll initiate intravenous Lasix -Daily weights and accurate I.'s and Os Hypertension -Well controlled. Patient was on monotherapy HCTZ at home Hypothyroidism -Recheck TSH   Code Status: Full Family Communication: Pt at bedside Disposition Plan: PT evaluation    Review of Systems:  Constitutional: Negative for fever, chills and malaise/fatigue. Negative for diaphoresis.  HENT: Negative for hearing loss, ear pain, nosebleeds, congestion, sore throat, neck pain, tinnitus and ear discharge.   Eyes: Negative for blurred vision, double vision, photophobia, pain, discharge and redness.  Respiratory: Negative for cough, hemoptysis, sputum production, wheezing and stridor.   Cardiovascular: Negative for palpitations, orthopnea, claudication and leg swelling.  Gastrointestinal: Negative for nausea, vomiting and abdominal pain. Negative for heartburn, constipation, blood in stool and melena.  Genitourinary: Negative for dysuria, urgency, frequency, hematuria and flank pain.  Musculoskeletal: Negative for myalgias, back pain, joint pain and falls.  Skin: Negative for itching and rash.  Neurological: Negative for tingling, tremors, sensory change, speech change, focal weakness, loss of consciousness and headaches.  Endo/Heme/Allergies: Negative for environmental allergies and polydipsia. Does not bruise/bleed easily.  Psychiatric/Behavioral: Negative for suicidal ideas. The patient is not nervous/anxious.      Past Medical History  Diagnosis Date  . Thyroid disease   . Hypertension   . Prolapse of bladder   . Pelvic relaxation   . Hypothyroidism     Past Surgical History  Procedure Date  . Vaginal hysterectomy 15 years ago  . Bladder tact 15 years ago  . Tonsillectomy   . Neck surgery 2010  . Shoulder surgery   . Thyroid disease   . Anterior  and posterior repair 12/05/2011    Procedure: ANTERIOR (CYSTOCELE) AND POSTERIOR REPAIR (RECTOCELE);  Surgeon: [name], MD;  Location: [facility];  Service: Gynecology;  Laterality: N/A;  with cysto    Social History:  reports that she has never smoked. She has never used smokeless tobacco. She reports that she does not drink alcohol or use illicit drugs.  No Known Allergies  Family History  Problem Relation Age of Onset  . Thyroid disease Mother   . Hypertension Mother   . Aneurysm Father   . Cancer Sister     LIVER  . Stroke Other   . Sleep apnea Other     Prior to Admission medications   Medication Sig Start Date End Date Taking? Authorizing Provider  calcium-vitamin D (OSCAL WITH D) 500-200 MG-UNIT per tablet Take 1 tablet by mouth every morning.   Yes Historical Provider, MD  Cranberry 1000 MG CAPS Take 1 capsule by mouth daily.   Yes Historical Provider, MD  hydrochlorothiazide (HYDRODIURIL) 25 MG tablet Take 25 mg by mouth every morning.    Yes Historical Provider, MD  levothyroxine (SYNTHROID, LEVOTHROID) 100 MCG tablet Take 100 mcg by mouth every morning.    Yes Historical Provider, MD  loratadine (CLARITIN) 10 MG tablet Take 10 mg by mouth every evening.   Yes Historical Provider, MD  Multiple Vitamin (MULTIVITAMIN WITH MINERALS) TABS Take 1 tablet by mouth every morning. Centrum silver   Yes Historical Provider, MD  promethazine (PHENERGAN) 12.5 MG tablet Take 1 tablet (12.5 mg total) by mouth every 6 (six) hours as needed for nausea. 12/06/11 12/13/11  Earnstine Regal, PA    Physical Exam: Filed Vitals:   01/20/12 1530 01/20/12 1545 01/20/12 1600 01/20/12 1617  BP: 116/67 116/69 117/65 115/71  Pulse: 72 69 72 71  Temp:      TempSrc:      Resp: _0 Height:      Weight:      SpO2: 98% 98% 98% 98%    Physical Exam  Constitutional: Appears well-developed and well-nourished. No distress.  HENT: Normocephalic. External right and left ear normal. Oropharynx is clear and moist.  Eyes: Conjunctivae and EOM are normal. PERRLA, no scleral icterus.  Neck: Normal ROM. Neck supple.  No JVD. No tracheal deviation. No thyromegaly.  CVS: RRR, S1/S2 +, no murmurs, no gallops, no carotid bruit. There is no JVD. Pulmonary: Bibasilar crackles. No wheezes. Good air movement.  Abdominal: Soft. BS +,  no distension, tenderness, rebound or guarding.  Musculoskeletal: Normal range of motion. No edema and no tenderness.  Lymphadenopathy: No lymphadenopathy noted, cervical, inguinal. Neuro: Alert. Normal reflexes, muscle tone coordination. No cranial nerve deficit. Skin: Skin is warm and dry. No rash noted. Not diaphoretic. No erythema. No pallor.  Psychiatric: Normal mood and affect. Behavior, judgment, thought content normal.   Labs on Admission:  Basic Metabolic Panel:  Lab 16/10/96 1237  NA 139  K 3.9  CL 103  CO2 25  GLUCOSE 102*  BUN 18  CREATININE 0.88  CALCIUM 9.0  MG --  PHOS --   Liver Function Tests: No results found for this basename: AST:5,ALT:5,ALKPHOS:5,BILITOT:5,PROT:5,ALBUMIN:5 in the last 168 hours No results found for this basename: LIPASE:5,AMYLASE:5 in the last 168 hours No results found for this basename: AMMONIA:5 in the last 168 hours CBC:  Lab 01/20/12 1237  WBC 6.6  NEUTROABS 4.0  HGB 10.2*  HCT 30.9*  MCV 83.5  PLT 206   Cardiac Enzymes:  No results found for this basename: CKTOTAL:5,CKMB:5,CKMBINDEX:5,TROPONINI:5 in the last 168 hours BNP: No components found with this basename: POCBNP:5 CBG: No results found for this basename: GLUCAP:5 in the last 168 hours  Radiological Exams on Admission: Dg Chest 2 View  01/20/2012  *RADIOLOGY REPORT*  Clinical Data: Chest pain.  Short of breath.  CHEST - 2 VIEW  Comparison: 05/01/2010  Findings: Moderate cardiomegaly.  Vascular congestion.  Mild edema suspected.  Left basilar atelectasis.  No pneumothorax or pleural effusion. Hiatal hernia is stable.  IMPRESSION: Mild CHF.  Left basilar atelectasis.  Original Report Authenticated By: Jamas Lav, M.D.   Ct Angio Chest W/cm &/or Wo  Cm  01/20/2012  *RADIOLOGY REPORT*  Clinical Data: Chest pain, short of breath  CT ANGIOGRAPHY CHEST  Technique:  Multidetector CT imaging of the chest using the standard protocol during bolus administration of intravenous contrast. Multiplanar reconstructed images including MIPs were obtained and reviewed to evaluate the vascular anatomy.  Contrast: 185m OMNIPAQUE IOHEXOL 350 MG/ML SOLN  Comparison: Chest radiograph 05/01/2010  Findings: There are no filling defects within the pulmonary arteries to suggest acute pulmonary embolism.  No acute findings of the aorta great vessels.  Coronary calcifications are present.  There is interlobular septal thickening at the lung bases and ground-glass opacities.  No clear evidence of pneumonia.  No pleural fluid.  No pneumothorax.  Limited view of the upper abdomen unremarkable.  There is a large hiatal hernia present.  No view of the skeleton is unremarkable.  IMPRESSION:  1.  No evidence acute pulmonary embolism. 2. Interlobular septal thickening suggest interstitial edema. 3.  Coronary calcifications. 4.  Large hiatal hernia.  Original Report Authenticated By: SSuzy Bouchard M.D.    EKG: Normal sinus rhythm, no ST/T wave changes  Aubra Pappalardo, MD  [facility] Pager [phone] If 7PM-7AM, please contact night-coverage [email or web address] Password [facility]8/05/2012, 4:43 PM

## 2012-01-20 NOTE — ED Notes (Signed)
PT sent from PCP office with reports of CP and SHOB for several weeks . PCP reported PT to be in [facility] . EMS reported  Pt not to be in [facility] per EMS . EMS reported  PVC's

## 2012-01-20 NOTE — Progress Notes (Signed)
Disposition Note  Tina Calderon, is a 71 y.o. female,   MRN: [id number]  -  DOB - Mar 04, 1941  Outpatient Primary MD for the patient is [name], MD   Blood pressure 122/74, pulse 82, temperature 97.4 F (36.3 C), temperature source Oral, resp. rate 16, height 5' 2.5" (1.588 m), weight 63.05 kg (139 lb), SpO2 98.00%.  Principal Problem:  *Chest pain syndrome Active Problems:  Family history of cardiovascular disease  Anemia  Interstitial edema seen on chest CT 01/20/12  Thyroid disease  H/O bladder repair surgery   Patient presented with significant chest pain with both typical and atypical features. Has had progressive chest pain for several months worse over the past 24 hours. Chest pain is midsternal nonradiating and has been associated with shortness of breath and nausea and has been brought about with exertion. Patient also notices increased chest discomfort with deeper inspiration. She has a positive family history of a mother with coronary disease who has undergone CABG procedure. She has recently undergone pelvic surgery 12/05/2011 at presentation her d-dimer was mildly elevated at CT angios the chest showed no evidence of PE although did show a new finding of interstitial edema. The emergency department physician felt the patient had unstable angina and started her on IV nitroglycerin. Her EKG is nonspecific and shows no evidence of acute ischemic changes as similar to her June 2013 EKG. Laboratory data also revealed a new finding of anemia. Because she remains on IV nitroglycerin and continues with some mild chest pain she will need to be admitted to the step down unit for further evaluation and treatment unless her chest pain resolved and her IV nitroglycerin can be discontinued. I have notified the ER physician the need for step down bed and holding orders. I've also notified regina for management of need for step down bed. Patient on exam is clinically stable.   Erin Hearing,  ANP

## 2012-01-20 NOTE — Consult Note (Signed)
CONSULT NOTE  Date: 01/20/2012               Patient Name:  CAMORA TREMAIN MRN: [id number]  DOB: 11/29/1940 Age / Sex: 71 y.o., female        PCP: [name] Primary Cardiologist: [facility]            Referring Physician: Orson Eva, MD              Reason for Consult: dyspnea           History of Present Illness: Patient is a 71 y.o. female with a PMHx of hypothyroidism and HTN , who was admitted to [facility] on 01/20/2012 for evaluation of progressive chest tightness and dyspnea.   Mrs. Ina Kick is a 71 year old female who has been in relatively good health. For the past several months she's had progressive chest tightness and shortness breath. She has been having episodes of chest tightness with everyday activities including loading the dish washer, taking a shower, and folding clothes. She frequently will have to lie down after dinner before she starts doing the dishes. She's not been able to do anything significant housework because of these episodes of shortness of breath.  She had bladder/rectal surgery in June of this year.  He was mildly elevated today.   A CT angiogram of the lungs was negative for pulmonary embolus.  She was scheduled to see her medical doctor this week. This morning she became acutely more short of breath and started having some chest tightness. EMS was called. They started her on some nitroglycerin and she felt better.  Medications: Outpatient medications:  (Not in a hospital admission)  Current medications: Current Facility-Administered Medications  Medication Dose Route Frequency Provider Last Rate Last Dose  . 0.9 %  sodium chloride infusion   Intravenous STAT Barbara Cower, MD      . 0.9 %  sodium chloride infusion  250 mL Intravenous PRN Orson Eva, MD      . acetaminophen (TYLENOL) tablet 650 mg  650 mg Oral Q4H PRN Orson Eva, MD      . aspirin chewable tablet 324 mg  324 mg Oral NOW Orson Eva, MD       Or  . aspirin  suppository 300 mg  300 mg Rectal NOW Orson Eva, MD      . aspirin EC tablet 81 mg  81 mg Oral Daily David Tat, MD      . enoxaparin (LOVENOX) injection 40 mg  40 mg Subcutaneous Daily David Tat, MD      . heparin bolus via infusion 4,000 Units  4,000 Units Intravenous Once Illene Labrador, PA-C   4,000 Units at 01/20/12 1458  . HYDROmorphone (DILAUDID) injection 1 mg  1 mg Intravenous Once Illene Labrador, PA-C   1 mg at 01/20/12 1237  . HYDROmorphone (DILAUDID) injection 1 mg  1 mg Intravenous Once Illene Labrador, PA-C   1 mg at 01/20/12 1648  . iohexol (OMNIPAQUE) 350 MG/ML injection 100 mL  100 mL Intravenous Once PRN Medication Radiologist, MD   100 mL at 01/20/12 1421  . nitroGLYCERIN (NITROSTAT) SL tablet 0.4 mg  0.4 mg Sublingual Q5 Min x 3 PRN Shanon Brow Tat, MD      . nitroGLYCERIN 0.2 mg/mL in dextrose 5 % infusion  5 mcg/min Intravenous Titrated Illene Labrador, PA-C 1.5 mL/hr at 01/20/12 1246 5 mcg/min at 01/20/12 1246  . nitroGLYCERIN 0.2 mg/mL in dextrose 5 %  infusion           . ondansetron (ZOFRAN) injection 4 mg  4 mg Intravenous Q6H PRN Orson Eva, MD      . regadenoson (LEXISCAN) injection SOLN 0.4 mg  0.4 mg Intravenous Once Thayer Headings, MD      . sodium chloride 0.9 % injection 3 mL  3 mL Intravenous Q12H David Tat, MD      . sodium chloride 0.9 % injection 3 mL  3 mL Intravenous PRN Orson Eva, MD      . DISCONTD: heparin ADULT infusion 100 units/mL (25000 units/250 mL)  1,000 Units/hr Intravenous Continuous Illene Labrador, PA-C 10 mL/hr at 01/20/12 1457 1,000 Units/hr at 01/20/12 1457   Current Outpatient Prescriptions  Medication Sig Dispense Refill  . calcium-vitamin D (OSCAL WITH D) 500-200 MG-UNIT per tablet Take 1 tablet by mouth every morning.      . Cranberry 1000 MG CAPS Take 1 capsule by mouth daily.      . hydrochlorothiazide (HYDRODIURIL) 25 MG tablet Take 25 mg by mouth every morning.       [name] levothyroxine (SYNTHROID, LEVOTHROID) 100 MCG tablet Take 100 mcg by mouth  every morning.       . loratadine (CLARITIN) 10 MG tablet Take 10 mg by mouth every evening.      . Multiple Vitamin (MULTIVITAMIN WITH MINERALS) TABS Take 1 tablet by mouth every morning. Centrum silver      . promethazine (PHENERGAN) 12.5 MG tablet Take 1 tablet (12.5 mg total) by mouth every 6 (six) hours as needed for nausea.  12 tablet  0     No Known Allergies   Past Medical History  Diagnosis Date  . Thyroid disease   . Hypertension   . Prolapse of bladder   . Pelvic relaxation   . Hypothyroidism     Past Surgical History  Procedure Date  . Vaginal hysterectomy 15 years ago  . Bladder tact 15 years ago  . Tonsillectomy   . Neck surgery 2010  . Shoulder surgery   . Thyroid disease   . Anterior and posterior repair 12/05/2011    Procedure: ANTERIOR (CYSTOCELE) AND POSTERIOR REPAIR (RECTOCELE);  Surgeon: [name], MD;  Location: [facility];  Service: Gynecology;  Laterality: N/A;  with cysto    Family History  Problem Relation Age of Onset  . Thyroid disease Mother   . Hypertension Mother   . Aneurysm Father   . Cancer Sister     LIVER  . Stroke Other   . Sleep apnea Other   . Coronary artery disease Mother   Her mother also had CAD / CABG  Social History:  reports that she has never smoked. She has never used smokeless tobacco. She reports that she does not drink alcohol or use illicit drugs.   Review of Systems: Constitutional:  denies fever, chills, diaphoresis, appetite change and fatigue.  HEENT: denies photophobia, eye pain, redness, hearing loss, ear pain, congestion, sore throat, rhinorrhea, sneezing, neck pain, neck stiffness and tinnitus.  Respiratory: admits to SOB, DOE, cough, chest tightness, and wheezing.  Cardiovascular: admits to chest tightness - also pleuretic cp  Gastrointestinal: denies nausea, vomiting, abdominal pain, diarrhea, constipation, blood in stool.  Genitourinary: denies dysuria, urgency, frequency, hematuria, flank pain and  difficulty urinating.  Musculoskeletal: denies  myalgias, back pain, joint swelling, arthralgias and gait problem.   Skin: denies pallor, rash and wound.  Neurological: denies dizziness, seizures, syncope, weakness, light-headedness, numbness and headaches.  Hematological: admits to easy bruising,   Psychiatric/ Behavioral: denies suicidal ideation, mood changes, confusion, nervousness, sleep disturbance and agitation.    Physical Exam: BP 128/77  Pulse 72  Temp 97.4 F (36.3 C) (Oral)  Resp 19  Ht 5' 2.5" (1.588 m)  Wt 139 lb (63.05 kg)  BMI 25.02 kg/m2  SpO2 97%  General: Vital signs reviewed and noted. Well-developed, well-nourished, in no acute distress; alert, appropriate and cooperative throughout examination.  Head: Normocephalic, atraumatic, sclera anicteric, mucus membranes are moist  Neck: Supple. Negative for carotid bruits. JVD not elevated.  Lungs:  Bilateral rales 1/2 up .  Coarse . Few wheezes.  Heart: RRR with S1 S2. No murmurs, rubs, or gallops appreciated.  Abdomen:  Soft, non-tender, non-distended with normoactive bowel sounds. No hepatomegaly. No rebound/guarding. No obvious abdominal masses  MSK: Strength and the appear normal for age.  Extremities: No clubbing or cyanosis. No edema.  Distal pedal pulses are 2+ and equal bilaterally.  Neurologic: Alert and oriented X 3. Moves all extremities spontaneously.  Psych: Responds to questions appropriately with a normal affect.    Lab results: Basic Metabolic Panel:  Lab 73/53/29 1237  NA 139  K 3.9  CL 103  CO2 25  GLUCOSE 102*  BUN 18  CREATININE 0.88  CALCIUM 9.0  MG --  PHOS --    CBC:  Lab 01/20/12 1237  WBC 6.6  NEUTROABS 4.0  HGB 10.2*  HCT 30.9*  MCV 83.5  PLT 206    Cardiac Enzymes: No results found for this basename: CKTOTAL:5,CKMB:5,CKMBINDEX:5,TROPONINI:5 in the last 168 hours  BNP: No components found with this basename: POCBNP:3  CBG: No results found for this basename:  GLUCAP:5 in the last 168 hours  Coagulation Studies: No results found for this basename: LABPROT:3,INR:3 in the last 72 hours   Other results: EKG : NSR, No ST or T abnormality   Imaging: Dg Chest 2 View  01/20/2012  *RADIOLOGY REPORT*  Clinical Data: Chest pain.  Short of breath.  CHEST - 2 VIEW  Comparison: 05/01/2010  Findings: Moderate cardiomegaly.  Vascular congestion.  Mild edema suspected.  Left basilar atelectasis.  No pneumothorax or pleural effusion. Hiatal hernia is stable.  IMPRESSION: Mild CHF.  Left basilar atelectasis.  Original Report Authenticated By: Jamas Lav, M.D.   Ct Angio Chest W/cm &/or Wo Cm  01/20/2012  *RADIOLOGY REPORT*  Clinical Data: Chest pain, short of breath  CT ANGIOGRAPHY CHEST  Technique:  Multidetector CT imaging of the chest using the standard protocol during bolus administration of intravenous contrast. Multiplanar reconstructed images including MIPs were obtained and reviewed to evaluate the vascular anatomy.  Contrast: 142m OMNIPAQUE IOHEXOL 350 MG/ML SOLN  Comparison: Chest radiograph 05/01/2010  Findings: There are no filling defects within the pulmonary arteries to suggest acute pulmonary embolism.  No acute findings of the aorta great vessels.  Coronary calcifications are present.  There is interlobular septal thickening at the lung bases and ground-glass opacities.  No clear evidence of pneumonia.  No pleural fluid.  No pneumothorax.  Limited view of the upper abdomen unremarkable.  There is a large hiatal hernia present.  No view of the skeleton is unremarkable.  IMPRESSION:  1.  No evidence acute pulmonary embolism. 2. Interlobular septal thickening suggest interstitial edema. 3.  Coronary calcifications. 4.  Large hiatal hernia.  Original Report Authenticated By: SSuzy Bouchard M.D.     Assessment & Plan:  Chest pain syndrome (01/20/2012) Ms. WMarszalekpresents with progressive dyspnea associated  with chest tightness.  She appears to be  comfortable but has significant rales in the lower 1/2 of her lungs.  Her CXR shows some pulmonary edema - the reports suggests cardiomegaly but I did not think is was significant.   She may have CHF ( ? Diastolic vs. Combined systolic/ diastolic, CAD, pulmonary disease, She is mildly anemic but I do not think we can attribute her symptoms to a mildly decreased hemoglobin of 10.2.  Will give her some Lasix and potassium tonight and QD starting tomorrow.  The Internal medicine team has ordered Lasix and Kdur.  Will get an echo tomorrow.  Will also order a Lexicographer for tomorrow.   Check cardiac enzymes but I doubt this is an acute coronary syndrome.    Thayer Headings, [name]., MD, [facility] 01/20/2012, 4:56 PM

## 2012-01-20 NOTE — ED Provider Notes (Signed)
History     CSN: [id number]  Arrival date & time 01/20/12  1208   First MD Initiated Contact with Patient 01/20/12 1221      Chief Complaint  Patient presents with  . Chest Pain  . Shortness of Breath    (Consider location/radiation/quality/duration/timing/severity/associated sxs/prior treatment) HPI Comments: 71 y/o female with hypertension presents with chest pain and sob x 3 weeks, worsening yesterday. States she was taking out her trash which she needed to put only 150 ft from her house, and had to stop 3 times to catch her breath due to chest tightness and feeling sob. Describes the pain as sharp and stabbing, mostly on the left side, rated 7/10, worse on exertion and deep inspiration. Pain decreased at rest. Has to tried taking anything for her pain. Denies any history of MI. No family history of heart disease before the age of 34. She is a non smoker. Admits to associated bilateral arm weakness. Denies nausea, vomiting, diaphoresis, cough, fever, chills, lightheadedness, dizziness. Patient went to PCP this morning and was in aflutter- called EMS and was taken to ED. She was given aspirin and nitro prior to arrival. Denies ever having pain like this before.  The history is provided by the patient and the spouse.    Past Medical History  Diagnosis Date  . Thyroid disease   . Hypertension   . Prolapse of bladder   . Pelvic relaxation   . Hypothyroidism     Past Surgical History  Procedure Date  . Vaginal hysterectomy 15 years ago  . Bladder tact 15 years ago  . Tonsillectomy   . Neck surgery 2010  . Shoulder surgery   . Thyroid disease   . Anterior and posterior repair 12/05/2011    Procedure: ANTERIOR (CYSTOCELE) AND POSTERIOR REPAIR (RECTOCELE);  Surgeon: [name], MD;  Location: [facility];  Service: Gynecology;  Laterality: N/A;  with cysto    Family History  Problem Relation Age of Onset  . Thyroid disease Mother   . Hypertension Mother   . Aneurysm Father     . Cancer Sister     LIVER  . Stroke Other   . Sleep apnea Other     History  Substance Use Topics  . Smoking status: Never Smoker   . Smokeless tobacco: Never Used  . Alcohol Use: No    OB History    Grav Para Term Preterm Abortions TAB SAB Ect Mult Living   _0 Review of Systems  Constitutional: Negative for fever, chills and diaphoresis.  Respiratory: Positive for chest tightness and shortness of breath.   Cardiovascular: Positive for chest pain.  Gastrointestinal: Negative for nausea and vomiting.  Neurological: Negative for dizziness and light-headedness.    Allergies  Review of patient's allergies indicates no known allergies.  Home Medications   Current Outpatient Rx  Name Route Sig Dispense Refill  . CALCIUM CARBONATE-VITAMIN D 500-200 MG-UNIT PO TABS Oral Take 1 tablet by mouth every morning.    [name] HYDROCHLOROTHIAZIDE 25 MG PO TABS Oral Take 25 mg by mouth every morning.     [name] LEVOTHYROXINE SODIUM 100 MCG PO TABS Oral Take 100 mcg by mouth every morning.     [name] LORATADINE 10 MG PO TABS Oral Take 10 mg by mouth every evening.    . ADULT MULTIVITAMIN W/MINERALS CH Oral Take 1 tablet by mouth every morning. Centrum silver    .  PROMETHAZINE HCL 12.5 MG PO TABS Oral Take 1 tablet (12.5 mg total) by mouth every 6 (six) hours as needed for nausea. 12 tablet 0  . TINIDAZOLE 500 MG PO TABS Oral Take 4 tablets (2,000 mg total) by mouth daily. For 2 days. 8 tablet 0    BP 122/74  Pulse 82  Temp 97.4 F (36.3 C) (Oral)  Resp 16  SpO2 98%  Physical Exam  Nursing note and vitals reviewed. Constitutional: She is oriented to person, place, and time. She appears well-developed and well-nourished.       anxious  HENT:  Head: Normocephalic and atraumatic.  Mouth/Throat: Oropharynx is clear and moist.  Eyes: Conjunctivae are normal.  Neck: Neck supple. No JVD present.  Cardiovascular: Normal rate, regular rhythm, normal heart sounds and intact distal  pulses.   Pulmonary/Chest: Effort normal. She has no decreased breath sounds. She has no wheezes.       Expiratory crackles in right lower lung field posteriorly. Became tachypneic after taking a few deep breaths.  Abdominal: Soft. Normal appearance and bowel sounds are normal. There is no tenderness.  Neurological: She is alert and oriented to person, place, and time.  Skin: Skin is warm and dry. She is not diaphoretic.  Psychiatric: Her speech is normal and behavior is normal. Her mood appears anxious.    ED Course  Procedures (including critical care time)  Labs Reviewed - No data to display Dg Chest 2 View  01/20/2012  *RADIOLOGY REPORT*  Clinical Data: Chest pain.  Short of breath.  CHEST - 2 VIEW  Comparison: 05/01/2010  Findings: Moderate cardiomegaly.  Vascular congestion.  Mild edema suspected.  Left basilar atelectasis.  No pneumothorax or pleural effusion. Hiatal hernia is stable.  IMPRESSION: Mild CHF.  Left basilar atelectasis.  Original Report Authenticated By: Jamas Lav, M.D.   Ct Angio Chest W/cm &/or Wo Cm  01/20/2012  *RADIOLOGY REPORT*  Clinical Data: Chest pain, short of breath  CT ANGIOGRAPHY CHEST  Technique:  Multidetector CT imaging of the chest using the standard protocol during bolus administration of intravenous contrast. Multiplanar reconstructed images including MIPs were obtained and reviewed to evaluate the vascular anatomy.  Contrast: 119m OMNIPAQUE IOHEXOL 350 MG/ML SOLN  Comparison: Chest radiograph 05/01/2010  Findings: There are no filling defects within the pulmonary arteries to suggest acute pulmonary embolism.  No acute findings of the aorta great vessels.  Coronary calcifications are present.  There is interlobular septal thickening at the lung bases and ground-glass opacities.  No clear evidence of pneumonia.  No pleural fluid.  No pneumothorax.  Limited view of the upper abdomen unremarkable.  There is a large hiatal hernia present.  No view of the  skeleton is unremarkable.  IMPRESSION:  1.  No evidence acute pulmonary embolism. 2. Interlobular septal thickening suggest interstitial edema. 3.  Coronary calcifications. 4.  Large hiatal hernia.  Original Report Authenticated By: SSuzy Bouchard M.D.   Results for orders placed during the hospital encounter of 01/20/12  CBC WITH DIFFERENTIAL      Component Value Range   WBC 6.6  4.0 - 10.5 K/uL   RBC 3.70 (*) 3.87 - 5.11 MIL/uL   Hemoglobin 10.2 (*) 12.0 - 15.0 g/dL   HCT 30.9 (*) 36.0 - 46.0 %   MCV 83.5  78.0 - 100.0 fL   MCH 27.6  26.0 - 34.0 pg   MCHC 33.0  30.0 - 36.0 g/dL   RDW 14.9  11.5 - 15.5 %  Platelets 206  150 - 400 K/uL   Neutrophils Relative 61  43 - 77 %   Neutro Abs 4.0  1.7 - 7.7 K/uL   Lymphocytes Relative 22  12 - 46 %   Lymphs Abs 1.4  0.7 - 4.0 K/uL   Monocytes Relative 12  3 - 12 %   Monocytes Absolute 0.8  0.1 - 1.0 K/uL   Eosinophils Relative 4  0 - 5 %   Eosinophils Absolute 0.3  0.0 - 0.7 K/uL   Basophils Relative 1  0 - 1 %   Basophils Absolute 0.0  0.0 - 0.1 K/uL  BASIC METABOLIC PANEL      Component Value Range   Sodium 139  135 - 145 mEq/L   Potassium 3.9  3.5 - 5.1 mEq/L   Chloride 103  96 - 112 mEq/L   CO2 25  19 - 32 mEq/L   Glucose, Bld 102 (*) 70 - 99 mg/dL   BUN 18  6 - 23 mg/dL   Creatinine, Ser 0.88  0.50 - 1.10 mg/dL   Calcium 9.0  8.4 - 10.5 mg/dL   GFR calc non Af Amer 64 (*) >90 mL/min   GFR calc Af Amer 74 (*) >90 mL/min  D-DIMER, QUANTITATIVE      Component Value Range   D-Dimer, Quant 0.85 (*) 0.00 - 0.48 ug/mL-FEU  POCT I-STAT TROPONIN I      Component Value Range   Troponin i, poc 0.01  0.00 - 0.08 ng/mL   Comment 3              Date: 01/20/2012  Rate: 81  Rhythm: normal sinus rhythm  QRS Axis: normal  Intervals: normal  ST/T Wave abnormalities: normal  Conduction Disutrbances:atrial premature complex  Narrative Interpretation: no stemi  Old EKG Reviewed: unchanged   No diagnosis found. Dx- unstabile angina,  chest pain, sob, mild CHF   MDM  71 y/o female presenting with both pleuritic and anginal chest symptoms. ekg showing no stemi. Will obtain labs, chest xray, manage pain, and start heparin and nitro drip. 2:34 PM D-dimer positive- obtained CTA which showed no acute PE. Case discussed with Dr. Vanessa Kick who also evaluated patient. Will admit due to continuing anginal symptoms.        Illene Labrador, PA-C 01/20/12 1451

## 2012-01-20 NOTE — ED Provider Notes (Addendum)
I saw and evaluated the patient, reviewed the resident's note and I agree with the findings and plan. Yest. Pt had exertional cp with sob when taking trash cans down drive way.   Cp described as tightness.  Also has pleuritic component.  Will tx for unstable ( new onset ) angina but also check for pe.   tx with ntg and heparin.  Barbara Cower, MD 01/20/12 1456  ECG.   Normal sinus rhythm at 83 beats per minute. Normal axis. Normal intervals. Normal.  ST and T-wave  Barbara Cower, MD 01/20/12 1526

## 2012-01-20 NOTE — ED Provider Notes (Signed)
I saw and evaluated the patient, reviewed the resident's note and I agree with the findings and plan.  Barbara Cower, MD 01/20/12 1553

## 2012-01-20 NOTE — ED Notes (Signed)
Heart healthy diet ordered at 1643

## 2012-01-21 ENCOUNTER — Inpatient Hospital Stay (HOSPITAL_COMMUNITY): Payer: Medicare Other

## 2012-01-21 ENCOUNTER — Encounter (HOSPITAL_COMMUNITY): Payer: Self-pay | Admitting: *Deleted

## 2012-01-21 DIAGNOSIS — I509 Heart failure, unspecified: Secondary | ICD-10-CM

## 2012-01-21 DIAGNOSIS — J9691 Respiratory failure, unspecified with hypoxia: Secondary | ICD-10-CM | POA: Diagnosis present

## 2012-01-21 DIAGNOSIS — J9600 Acute respiratory failure, unspecified whether with hypoxia or hypercapnia: Principal | ICD-10-CM

## 2012-01-21 DIAGNOSIS — R079 Chest pain, unspecified: Secondary | ICD-10-CM

## 2012-01-21 DIAGNOSIS — E079 Disorder of thyroid, unspecified: Secondary | ICD-10-CM

## 2012-01-21 DIAGNOSIS — R609 Edema, unspecified: Secondary | ICD-10-CM

## 2012-01-21 LAB — LIPID PANEL
Cholesterol: 192 mg/dL (ref 0–200)
LDL Cholesterol: 98 mg/dL (ref 0–99)

## 2012-01-21 LAB — BASIC METABOLIC PANEL
BUN: 15 mg/dL (ref 6–23)
Chloride: 97 mEq/L (ref 96–112)
Creatinine, Ser: 0.91 mg/dL (ref 0.50–1.10)
GFR calc Af Amer: 71 mL/min — ABNORMAL LOW (ref >90)
GFR calc non Af Amer: 62 mL/min — ABNORMAL LOW (ref >90)
Glucose, Bld: 95 mg/dL (ref 70–99)
Potassium: 3.5 mEq/L (ref 3.5–5.1)

## 2012-01-21 LAB — CARDIAC PANEL(CRET KIN+CKTOT+MB+TROPI)
CK, MB: 1.8 ng/mL (ref 0.3–4.0)
Relative Index: INVALID (ref 0.0–2.5)
Total CK: 77 U/L (ref 7–177)
Troponin I: <0.3 ng/mL (ref <0.30)

## 2012-01-21 MED ORDER — TECHNETIUM TC 99M TETROFOSMIN IV KIT
10.0000 | PACK | Freq: Once | INTRAVENOUS | Status: AC | PRN
  Administered 2012-01-21: 10 via INTRAVENOUS

## 2012-01-21 MED ORDER — ACETAMINOPHEN 325 MG PO TABS
ORAL_TABLET | ORAL | Status: AC
  Administered 2012-01-21: 650 mg via ORAL
  Filled 2012-01-21: qty 2

## 2012-01-21 MED ORDER — SODIUM CHLORIDE 0.9 % IJ SOLN: 3.0000 mL | Freq: Two times a day (BID) | INTRAMUSCULAR | Status: DC

## 2012-01-21 MED ORDER — MORPHINE SULFATE 2 MG/ML IJ SOLN
INTRAMUSCULAR | Status: AC
  Filled 2012-01-21: qty 1

## 2012-01-21 MED ORDER — TECHNETIUM TC 99M TETROFOSMIN IV KIT
30.0000 | PACK | Freq: Once | INTRAVENOUS | Status: AC | PRN
  Administered 2012-01-21: 30 via INTRAVENOUS

## 2012-01-21 MED ORDER — SODIUM CHLORIDE 0.9 % IV SOLN: INTRAVENOUS | Status: DC

## 2012-01-21 MED ORDER — DIAZEPAM 5 MG PO TABS
5.0000 mg | ORAL_TABLET | ORAL | Status: AC
  Administered 2012-01-22: 5 mg via ORAL
  Filled 2012-01-21: qty 1

## 2012-01-21 MED ORDER — MORPHINE SULFATE 2 MG/ML IJ SOLN
2.0000 mg | Freq: Once | INTRAMUSCULAR | Status: AC
  Administered 2012-01-21: 2 mg via INTRAVENOUS

## 2012-01-21 MED ORDER — SODIUM CHLORIDE 0.9 % IJ SOLN: 3.0000 mL | INTRAMUSCULAR | Status: DC | PRN

## 2012-01-21 MED ORDER — SODIUM CHLORIDE 0.9 % IV SOLN: 250.0000 mL | INTRAVENOUS | Status: DC | PRN

## 2012-01-21 MED ORDER — TRAMADOL HCL 50 MG PO TABS
50.0000 mg | ORAL_TABLET | Freq: Four times a day (QID) | ORAL | Status: DC | PRN
  Administered 2012-01-21: 50 mg via ORAL
  Filled 2012-01-21: qty 2

## 2012-01-21 MED ORDER — ASPIRIN 81 MG PO CHEW
324.0000 mg | CHEWABLE_TABLET | ORAL | Status: AC
  Administered 2012-01-22: 324 mg via ORAL
  Filled 2012-01-21: qty 4

## 2012-01-21 NOTE — Progress Notes (Signed)
  Echocardiogram 2D Echocardiogram has been performed.  Mauricio Po 01/21/2012, 2:51 PM

## 2012-01-21 NOTE — Progress Notes (Signed)
   CARE MANAGEMENT NOTE 01/21/2012  Patient:  Tina Calderon, Tina Calderon   Account Number:  [id number]  Date Initiated:  01/21/2012  Documentation initiated by:  Tina Calderon  Subjective/Objective Assessment:   adm w ch pain     Action/Plan:   lives w husband, pcp dr Tina Calderon   Anticipated DC Date:     Anticipated DC Plan:        [place]  CM consult      Choice offered to / List presented to:             Status of service:   Medicare Important Message given?   (If response is "NO", the following Medicare IM given date fields will be blank) Date Medicare IM given:   Date Additional Medicare IM given:    Discharge Disposition:    Per UR Regulation:  Reviewed for med. necessity/level of care/duration of stay  If discussed at [place] of Stay Meetings, dates discussed:    Comments:  8/13 9:52a Tina Calem Cocozza rn,bsn [phone]

## 2012-01-21 NOTE — Progress Notes (Signed)
Md notified that patient still having chest pressure and feeling SOB. Nitro restarted patient said earlier that she felt better with the nitro on. Nitro at 5 mcg. Vitals stable and MD has scheduled a cath for the AM.  Will continue to monitor.   Marie Chow, Mervin Kung RN

## 2012-01-21 NOTE — Progress Notes (Signed)
PROGRESS NOTE  Subjective:   Pt was admitted with dyspnea - possibly due to CHF.  Diuresed 2.3 liters last night.  Still has some chest tightness.  Cardiac enzymes are negative.  Objective:    Vital Signs:   Temp:  [97.4 F (36.3 C)-97.8 F (36.6 C)] 97.5 F (36.4 C) (08/13 0427) Pulse Rate:  [64-91] 67  (08/13 0700) Resp:  [10-25] 17  (08/13 0700) BP: (97-163)/(33-81) 116/65 mmHg (08/13 0500) SpO2:  [92 %-100 %] 94 % (08/13 0700) Weight:  [138 lb 0.1 oz (62.6 kg)-143 lb 1.3 oz (64.9 kg)] 138 lb 0.1 oz (62.6 kg) (08/13 0500)  Last BM Date: 01/19/12   24-hour weight change: Weight change:   Weight trends: Filed Weights   01/20/12 1305 01/20/12 1753 01/21/12 0500  Weight: 139 lb (63.05 kg) 143 lb 1.3 oz (64.9 kg) 138 lb 0.1 oz (62.6 kg)    Intake/Output:  08/12 0701 - 08/13 0700 In: 261.4 [P.O.:100; I.V.:157.4; IV Piggyback:4] Out: 2575 [Urine:2575]     Physical Exam: BP 116/65  Pulse 67  Temp 97.5 F (36.4 C) (Oral)  Resp 17  Ht 5' 2.5" (1.588 m)  Wt 138 lb 0.1 oz (62.6 kg)  BMI 24.84 kg/m2  SpO2 94%  General: Vital signs reviewed and noted. Well-developed, well-nourished, in no acute distress; alert, appropriate and cooperative .  Head: Normocephalic, atraumatic.  Eyes: conjunctivae/corneas clear.  EOM's intact.   Throat: normal  Neck: Supple. Normal carotids. No JVD  Lungs:  Bilateral rales in bases.  Heart: Regular rate,  With normal  S1 S2. No murmurs, gallops or rubs  Abdomen:  Soft, non-tender, non-distended with normoactive bowel sounds. No hepatomegaly. No rebound/guarding. No abdominal masses.  Extremities: Distal pedal pulses are 2+ .  No edema.    Neurologic: A&O X3, CN II - XII are grossly intact. Motor strength is 5/5 in the all 4 extremities.  Psych: Responds to questions appropriately with normal affect.    Labs: BMET:  Basename 01/21/12 0515 01/20/12 1237  NA 137 139  K 3.5 3.9  CL 97 103  CO2 30 25  GLUCOSE 95 102*  BUN 15 18    CREATININE 0.91 0.88  CALCIUM 9.2 9.0  MG -- --  PHOS -- --    Liver function tests: No results found for this basename: AST:2,ALT:2,ALKPHOS:2,BILITOT:2,PROT:2,ALBUMIN:2 in the last 72 hours No results found for this basename: LIPASE:2,AMYLASE:2 in the last 72 hours  CBC:  Basename 01/20/12 1237  WBC 6.6  NEUTROABS 4.0  HGB 10.2*  HCT 30.9*  MCV 83.5  PLT 206    Cardiac Enzymes:  Basename 01/21/12 0515 01/20/12 2220 01/20/12 1648  CKTOTAL 77 72 57  CKMB 1.8 2.2 1.8  TROPONINI <0.30 <0.30 <0.30    Coagulation Studies:  Basename 01/20/12 1647  LABPROT 14.8  INR 1.14    Other: No components found with this basename: POCBNP:3  Basename 01/20/12 1237  DDIMER 0.85*    Basename 01/20/12 1647  HGBA1C 6.1*    Basename 01/21/12 0515  CHOL 192  HDL 46  LDLCALC 98  TRIG 239*  CHOLHDL 4.2    Basename 01/20/12 1647  TSH 1.945  T4TOTAL --  T3FREE --  THYROIDAB --   No results found for this basename: VITAMINB12,FOLATE,FERRITIN,TIBC,IRON,RETICCTPCT in the last 72 hours    Tele:  NSR.  Medications:    Infusions:    . sodium chloride 10 mL/hr at 01/20/12 2145  . nitroGLYCERIN 5 mcg/min (01/20/12 1900)  . DISCONTD: heparin Stopped (  01/20/12 1822)    Scheduled Medications:    . aspirin EC  81 mg Oral Daily  . enoxaparin (LOVENOX) injection  40 mg Subcutaneous Daily  . furosemide  20 mg Intravenous Q12H  . heparin  4,000 Units Intravenous Once  .  HYDROmorphone (DILAUDID) injection  1 mg Intravenous Once  .  HYDROmorphone (DILAUDID) injection  1 mg Intravenous Once  . nitroGLYCERIN      . regadenoson  0.4 mg Intravenous Once  . sodium chloride  3 mL Intravenous Q12H  . DISCONTD: sodium chloride   Intravenous STAT  . DISCONTD: aspirin  324 mg Oral NOW  . DISCONTD: aspirin  300 mg Rectal NOW    Assessment/ Plan:    Chest pain syndrome (01/20/2012) Cardiac enzymes are negative so far.  Thyroid disease () TSH is normal  H/O bladder repair  surgery ()   Family history of cardiovascular disease (01/20/2012) Trigs are elevated.  Anemia (01/20/2012)  Possible CHF : echo and Lexiscan myoview to be done today.  Continue lasix.  Disposition:  Length of Stay: 1  Thayer Headings, [name]., MD, [facility] 01/21/2012, 7:48 AM Office [phone] Pager [phone]

## 2012-01-21 NOTE — Progress Notes (Signed)
Lexiscan MV performed. Suanne Marker Barrett 01/21/2012 11:10 AM

## 2012-01-21 NOTE — Progress Notes (Signed)
[facility] PROGRESS NOTE  Tina Calderon YQM:[id number] DOB: December 03, 1940 DOA: 01/20/2012 PCP: [name], MD  Assessment/Plan:   *Respiratory failure with hypoxia Pulmonary edema? F/u on ECHO - diuresis per cardiology- f/u CXR after adequate diuresis   Interstitial edema seen on chest CT 01/20/12 As above - cont to diurese and f/u CXR for clearing in AM- Pt states her dyspnea has improved- I suspect she has diastolic dysfunction   Chest pain syndrome Negative cardiac enzymes- chest pain has resolved for how, however it was mostly exertional and therefore a myoview has been completed - results reveal that there is no reversible ischemia but possibly scarring from prior MIs.    Thyroid disease Cont home meds- TSH is stable   H/O bladder repair surgery Recent surgery for cystocele and rectocele   Anemia Follow  [facility] low fat diet  Pre-diabetic A1c is 6.1- follow sugars in hopital   Brief narrative: This is a 71 year old pleasant female who presented with 2-3 months of chest discomfort and shortness of breath on exertion. The significantly worsened on Sunday when she tried to take the trash can approximately 150 feet outside her house. The patient states that she had to stop 3 times due to experiencing chest discomfort and sugars of breath. Over the past 3 months, the patient has been complaining of chest discomfort and shortness of breath even when she takes a shower. She waited until today to go to her primary care provider's office at which time the patient was brought to the emergency department by EMS. The patient was given nitroglycerin sublingual spray with some relief of her chest discomfort. The patient also received aspirin in route. In emergency department, the patient was placed on a heparin drip as well as nitroglycerin drip. Cardiology has been consulted. Troponins are negative x1. EKG did not show any ST-T changes. The patient remained  hemodynamic stable at this time. She states that her chest discomfort went from 6 to a 2/10. Of note, there is no family history of premature coronary artery disease. There is no history of tobacco.  In the past 3 months, the patient hasn't had intermittent dizziness with her chest discomfort but no syncope. She denies any orthopnea or PND. She denies any increased abdominal girth or pedal edema.   Consultants:  [facility]  Procedures:  Myoview stress test on 01/21/12   HPI/Subjective: She feels worn out. No chest pain or dyspnea currently. No recent pedal edema. No recent flu like illness or cough.   Objective: Filed Vitals:   01/21/12 1107 01/21/12 1515 01/21/12 1600 01/21/12 1700  BP: 118/56 124/66 131/69   Pulse:  73 85 74  Temp:   98.1 F (36.7 C)   TempSrc:   Oral   Resp:  _0 Height:      Weight:      SpO2:  98% 98% 98%    Intake/Output Summary (Last 24 hours) at 01/21/12 1739 Last data filed at 01/21/12 1400  Gross per 24 hour  Intake 272.85 ml  Output   2975 ml  Net -2702.15 ml    Exam:   General:  Alert, oriented and in no distress  Cardiovascular: RRR, no murmurs  Respiratory: crackles at bases- left greater than right  Abdomen: soft, NT, ND, BS+  Ext: no c/c/e  Data Reviewed: Basic Metabolic Panel:  Lab 52/84/13 0515 01/20/12 1237  NA 137 139  K 3.5 3.9  CL 97 103  CO2 30 25  GLUCOSE 95  102*  BUN 15 18  CREATININE 0.91 0.88  CALCIUM 9.2 9.0  MG -- --  PHOS -- --   Liver Function Tests: No results found for this basename: AST:5,ALT:5,ALKPHOS:5,BILITOT:5,PROT:5,ALBUMIN:5 in the last 168 hours No results found for this basename: LIPASE:5,AMYLASE:5 in the last 168 hours No results found for this basename: AMMONIA:5 in the last 168 hours CBC:  Lab 01/20/12 1237  WBC 6.6  NEUTROABS 4.0  HGB 10.2*  HCT 30.9*  MCV 83.5  PLT 206   Cardiac Enzymes:  Lab 01/21/12 0515 01/20/12 2220 01/20/12 1648  CKTOTAL 77 72 57  CKMB  1.8 2.2 1.8  CKMBINDEX -- -- --  TROPONINI <0.30 <0.30 <0.30   BNP (last 3 results) No results found for this basename: PROBNP:3 in the last 8760 hours CBG: No results found for this basename: GLUCAP:5 in the last 168 hours  Recent Results (from the past 240 hour(s))  URINE CULTURE     Status: Normal   Collection Time   01/15/12  9:50 AM      Component Value Range Status Comment   Colony Count NO GROWTH   Final    Organism ID, Bacteria NO GROWTH   Final   MRSA PCR SCREENING     Status: Normal   Collection Time   01/20/12  5:40 PM      Component Value Range Status Comment   MRSA by PCR NEGATIVE  NEGATIVE Final      Studies: Dg Chest 2 View  01/20/2012  *RADIOLOGY REPORT*  Clinical Data: Chest pain.  Short of breath.  CHEST - 2 VIEW  Comparison: 05/01/2010  Findings: Moderate cardiomegaly.  Vascular congestion.  Mild edema suspected.  Left basilar atelectasis.  No pneumothorax or pleural effusion. Hiatal hernia is stable.  IMPRESSION: Mild CHF.  Left basilar atelectasis.  Original Report Authenticated By: Jamas Lav, M.D.   Ct Angio Chest W/cm &/or Wo Cm  01/20/2012  *RADIOLOGY REPORT*  Clinical Data: Chest pain, short of breath  CT ANGIOGRAPHY CHEST  Technique:  Multidetector CT imaging of the chest using the standard protocol during bolus administration of intravenous contrast. Multiplanar reconstructed images including MIPs were obtained and reviewed to evaluate the vascular anatomy.  Contrast: 140m OMNIPAQUE IOHEXOL 350 MG/ML SOLN  Comparison: Chest radiograph 05/01/2010  Findings: There are no filling defects within the pulmonary arteries to suggest acute pulmonary embolism.  No acute findings of the aorta great vessels.  Coronary calcifications are present.  There is interlobular septal thickening at the lung bases and ground-glass opacities.  No clear evidence of pneumonia.  No pleural fluid.  No pneumothorax.  Limited view of the upper abdomen unremarkable.  There is a large  hiatal hernia present.  No view of the skeleton is unremarkable.  IMPRESSION:  1.  No evidence acute pulmonary embolism. 2. Interlobular septal thickening suggest interstitial edema. 3.  Coronary calcifications. 4.  Large hiatal hernia.  Original Report Authenticated By: SSuzy Bouchard M.D.   Nm Myocar Multi W/spect W/wall Motion / Ef  01/21/2012  Clinical Data:  Chest tightness.  Dyspnea.  Technique:  Standard myocardial SPECT imaging performed after resting intravenous injection of Tc-92metrofosmin.  Subsequently, stress in the form of Lexiscan was administered under the supervision of the Cardiology staff.  At peak stress, Tc-9973mtrofosmin was injected intravenously and standard myocardial SPECT imaging performed.  Quantitative gated imaging also performed to evaluate left ventricular wall motion and estimate left ventricular ejection fraction.  Radiopharmaceutical: Tc-55m33mrofosmin, 10 mCi at rest  and 30 mCi at stress.  Comparison:  None  MYOCARDIAL IMAGING WITH SPECT (REST AND STRESS)  Findings:  Reduced activity is present in the lateral wall and anterior septal wall.  Given the streak-like transverse artifact on images, I suspect that a component of this is likely due to motion artifact.  However, the appearance is on stress and rest images, and could also reflect left ventricular scarring.  No inducible ischemia is observed.  LEFT VENTRICULAR EJECTION FRACTION  Findings:  Left ventricular end-diastolic volume is 30 ml cc.  End- systolic volume is markedly under estimated at 1 ml cc.  Derived LV ejection fraction is 97%, markedly overestimated.  GATED LEFT VENTRICULAR WALL MOTION STUDY  Findings:  Low ventricular wall motion wall thickening appear within normal limits, although degree of contraction is likely overestimated.  IMPRESSION:  1.  Reduced activity in the lateral wall and anteroseptal walls on stress and rest images could represent scar or motion artifact.  No inducible ischemia observed.  2.  The left ventricular ejection fraction is thought to be markedly overestimated based on counting statistics.  We do not demonstrate evidence of interventricular septal thickening on yesterday's chest CT.  Original Report Authenticated By: Carron Curie, M.D.    Scheduled Meds:    . aspirin EC  81 mg Oral Daily  . enoxaparin (LOVENOX) injection  40 mg Subcutaneous Daily  . furosemide  20 mg Intravenous Q12H  . nitroGLYCERIN      . regadenoson  0.4 mg Intravenous Once  . sodium chloride  3 mL Intravenous Q12H  . DISCONTD: sodium chloride   Intravenous STAT  . DISCONTD: aspirin  324 mg Oral NOW  . DISCONTD: aspirin  300 mg Rectal NOW   Continuous Infusions:    . sodium chloride 10 mL/hr at 01/20/12 2145  . nitroGLYCERIN 5 mcg/min (01/20/12 1900)    ________________________________________________________________________  Time spent: 35 min    [facility] Pager [phone] If 8PM-8AM, please contact night-coverage at [email or web address], password [facility] 01/21/2012, 5:39 PM  LOS: 1 day

## 2012-01-21 NOTE — Progress Notes (Addendum)
Discontinued patients nitro pre MD to allow to go for stress test. MD also allowed patient to travel off telemtry for a stress test in nuclear medicine. Pt states that her chest feels tight only when she takes a deep breath.   Glyn Gerads, Mervin Kung RN

## 2012-01-22 ENCOUNTER — Encounter (HOSPITAL_COMMUNITY)
Admission: EM | Disposition: A | Discharge status: Home / Self Care | Payer: Self-pay | Source: Home / Self Care | Attending: Internal Medicine

## 2012-01-22 ENCOUNTER — Encounter (HOSPITAL_COMMUNITY): Payer: Self-pay | Admitting: Family Medicine

## 2012-01-22 DIAGNOSIS — E785 Hyperlipidemia, unspecified: Secondary | ICD-10-CM

## 2012-01-22 DIAGNOSIS — I509 Heart failure, unspecified: Secondary | ICD-10-CM

## 2012-01-22 DIAGNOSIS — I2510 Atherosclerotic heart disease of native coronary artery without angina pectoris: Secondary | ICD-10-CM

## 2012-01-22 DIAGNOSIS — D649 Anemia, unspecified: Secondary | ICD-10-CM

## 2012-01-22 DIAGNOSIS — E781 Pure hyperglyceridemia: Secondary | ICD-10-CM

## 2012-01-22 HISTORY — PX: LEFT HEART CATHETERIZATION WITH CORONARY ANGIOGRAM: SHX5451

## 2012-01-22 HISTORY — DX: Heart failure, unspecified: I50.9

## 2012-01-22 SURGERY — LEFT HEART CATHETERIZATION WITH CORONARY ANGIOGRAM
Anesthesia: LOCAL

## 2012-01-22 MED ORDER — HEPARIN (PORCINE) IN NACL 2-0.9 UNIT/ML-% IJ SOLN
INTRAMUSCULAR | Status: AC
  Filled 2012-01-22: qty 2000

## 2012-01-22 MED ORDER — ONDANSETRON HCL 4 MG/2ML IJ SOLN: 4.0000 mg | Freq: Four times a day (QID) | INTRAMUSCULAR | Status: DC | PRN

## 2012-01-22 MED ORDER — SODIUM CHLORIDE 0.9 % IV SOLN: INTRAVENOUS | Status: DC

## 2012-01-22 MED ORDER — LIDOCAINE HCL (PF) 1 % IJ SOLN
INTRAMUSCULAR | Status: AC
  Filled 2012-01-22: qty 30

## 2012-01-22 MED ORDER — NITROGLYCERIN 0.2 MG/ML ON CALL CATH LAB
INTRAVENOUS | Status: AC
  Filled 2012-01-22: qty 1

## 2012-01-22 MED ORDER — MIDAZOLAM HCL 2 MG/2ML IJ SOLN
INTRAMUSCULAR | Status: AC
  Filled 2012-01-22: qty 2

## 2012-01-22 MED ORDER — ACETAMINOPHEN 325 MG PO TABS: 650.0000 mg | ORAL_TABLET | ORAL | Status: DC | PRN

## 2012-01-22 NOTE — CV Procedure (Signed)
  Cardiac Catheterization Procedure Note  Name: Tina Calderon MRN: [id number] DOB: Sep 17, 1940  Procedure: Left Heart Cath, Selective Coronary Angiography, LV angiography  Indication: Chest pain,  CHF  Procedural details: The right groin was prepped, draped, and anesthetized with 1% lidocaine. Using modified Seldinger technique, a 5 French sheath was introduced into the right femoral artery. Standard Judkins catheters were used for coronary angiography and left ventriculography. Catheter exchanges were performed over a guidewire. There were no immediate procedural complications. The patient was transferred to the post catheterization recovery area for further monitoring.  Procedural Findings:   Hemodynamics:     AO 122/67    LV 122/4   Coronary angiography:   Coronary dominance: Right  Left mainstem:   Long and normal  Left anterior descending (LAD):   Mild ostial and proximal calcification.  Proximal 30% focal stenosis.  D1 moderate to small sized with ostial 40%.  D2 small and normal.  Left circumflex (LCx):  AV groove normal.  OM 1 tiny.  OM2 small and normal.  OM3 large and normal.  Right coronary artery (RCA):  Large.  Normal.  PDA large and normal.  Left ventriculography: Left ventricular systolic function is normal, LVEF is estimated at 65%, there is no significant mitral regurgitation   Final Conclusions:  Mild coronary plaque.  Normal LV function.    Recommendations: No further cardiac work up.  She will continue to be managed for CHF with a preserved EF.    [name] 01/22/2012, 3:57 PM

## 2012-01-22 NOTE — Progress Notes (Signed)
[facility] PROGRESS NOTE  MAHRUKH SEGUIN JTT:[id number] DOB: 1940/07/14 DOA: 01/20/2012 PCP: [name], MD  Assessment/Plan: 1. Acute respiratory failure with hypoxia--resolved. Etiology unclear--diastolic CHF? (not mentioned on echocardiogram). Continue Lasix. 2. Chest pain--Cardiac workup negative--LHC with mild coronary plaque. CTA chest negative. Consider outpatient non-cardiac chest pain evaluation. 3. Possible diastolic CHF--Continue Lasix per cardiology. 4. Normocytic anemia--appears to be at baseline. Follow-up as outpatient. 5. Hypertriglyceridemia--follow-up as an outpatient. 6. Hypothyroidism--TSH normal. Continue Synthroid.  Code Status: Full code Family Communication: discussed with husband and sister at bedside Disposition Plan: home 8/15?  Murray Hodgkins, MD  [facility] Team 1 Pager [phone]. If 7PM-7AM, please contact night-coverage at [email or web address], password [facility] 01/22/2012, 9:15 AM  LOS: 2 days   Brief narrative: 71 year old woman presented with 2-3 months of chest discomfort and DOE. The significantly worsened on _0  ml  Output   1450 ml  Net  -1358 ml     Exam:   General:  Appears calm and comfortable  Cardiovascular: RRR, no m/r/g. No LE edema.  Respiratory: CTA bilaterally except for posterior rales r>l. Normal respiratory effort.  Data Reviewed: Basic Metabolic Panel:  Lab 07/62/26 0515 01/20/12 1237  NA 137 139  K 3.5 3.9  CL 97 103  CO2 30 25  GLUCOSE 95 102*  BUN 15 18  CREATININE 0.91 0.88  CALCIUM 9.2 9.0  MG -- --  PHOS -- --   CBC:  Lab 01/20/12 1237  WBC 6.6  NEUTROABS 4.0  HGB 10.2*  HCT 30.9*  MCV 83.5  PLT 206   Cardiac Enzymes:  Lab 01/21/12 0515 01/20/12 2220 01/20/12 1648  CKTOTAL 77 72 57  CKMB 1.8 2.2 1.8  CKMBINDEX -- -- --  TROPONINI <0.30 <0.30 <0.30   Recent Results (from the past 240 hour(s))  URINE CULTURE     Status: Normal   Collection Time   01/15/12  9:50 AM      Component Value Range Status Comment   Colony Count NO GROWTH   Final    Organism ID, Bacteria NO GROWTH   Final   MRSA PCR SCREENING     Status: Normal   Collection Time   01/20/12  5:40 PM      Component Value  Range Status Comment   MRSA by PCR NEGATIVE  NEGATIVE Final     Studies: Dg Chest 2 View  01/20/2012  *RADIOLOGY REPORT*  Clinical Data: Chest pain.  Short of breath.  CHEST - 2 VIEW  Comparison: 05/01/2010  Findings: Moderate cardiomegaly.  Vascular congestion.  Mild edema suspected.  Left basilar atelectasis.  No pneumothorax or pleural effusion. Hiatal hernia is stable.  IMPRESSION: Mild CHF.  Left basilar atelectasis.  Original Report Authenticated By: Jamas Lav, M.D.   Ct Angio Chest W/cm &/or Wo Cm  01/20/2012  *RADIOLOGY REPORT*  Clinical Data: Chest pain, short of  breath  CT ANGIOGRAPHY CHEST  Technique:  Multidetector CT imaging of the chest using the standard protocol during bolus administration of intravenous contrast. Multiplanar reconstructed images including MIPs were obtained and reviewed to evaluate the vascular anatomy.  Contrast: 18m OMNIPAQUE IOHEXOL 350 MG/ML SOLN  Comparison: Chest radiograph 05/01/2010  Findings: There are no filling defects within the pulmonary arteries to suggest acute pulmonary embolism.  No acute findings of the aorta great vessels.  Coronary calcifications are present.  There is interlobular septal thickening at the lung bases and ground-glass opacities.  No clear evidence of pneumonia.  No pleural fluid.  No pneumothorax.  Limited view of the upper abdomen unremarkable.  There is a large hiatal hernia present.  No view of the skeleton is unremarkable.  IMPRESSION:  1.  No evidence acute pulmonary embolism. 2. Interlobular septal thickening suggest interstitial edema. 3.  Coronary calcifications. 4.  Large hiatal hernia.  Original Report Authenticated By: SSuzy Bouchard M.D.   Nm Myocar Multi W/spect W/wall Motion / Ef  01/21/2012  Clinical Data:  Chest tightness.  Dyspnea.  Technique:  Standard myocardial SPECT imaging performed after resting intravenous injection of Tc-970metrofosmin.  Subsequently, stress in the form of Lexiscan was administered under the supervision of the Cardiology staff.  At peak stress, Tc-9929mtrofosmin was injected intravenously and standard myocardial SPECT imaging performed.  Quantitative gated imaging also performed to evaluate left ventricular wall motion and estimate left ventricular ejection fraction.  Radiopharmaceutical: Tc-65m52mrofosmin, 10 mCi at rest and 30 mCi at stress.  Comparison:  None  MYOCARDIAL IMAGING WITH SPECT (REST AND STRESS)  Findings:  Reduced activity is present in the lateral wall and anterior septal wall.  Given the streak-like transverse artifact on images, I suspect that a  component of this is likely due to motion artifact.  However, the appearance is on stress and rest images, and could also reflect left ventricular scarring.  No inducible ischemia is observed.  LEFT VENTRICULAR EJECTION FRACTION  Findings:  Left ventricular end-diastolic volume is 30 ml cc.  End- systolic volume is markedly under estimated at 1 ml cc.  Derived LV ejection fraction is 97%, markedly overestimated.  GATED LEFT VENTRICULAR WALL MOTION STUDY  Findings:  Low ventricular wall motion wall thickening appear within normal limits, although degree of contraction is likely overestimated.  IMPRESSION:  1.  Reduced activity in the lateral wall and anteroseptal walls on stress and rest images could represent scar or motion artifact.  No inducible ischemia observed. 2.  The left ventricular ejection fraction is thought to be markedly overestimated based on counting statistics.  We do not demonstrate evidence of interventricular septal thickening on yesterday's chest CT.  Original Report Authenticated By: WALTCarron CurieD.    Scheduled Meds:   . aspirin  324 mg Oral Pre-Cath  . aspirin EC  81 mg Oral Daily  .  diazepam  5 mg Oral On Call  . enoxaparin (LOVENOX) injection  40 mg Subcutaneous Daily  . furosemide  20 mg Intravenous Q12H  .  morphine injection  2 mg Intravenous Once  . morphine      . regadenoson  0.4 mg Intravenous Once  . sodium chloride  3 mL Intravenous Q12H  . sodium chloride  3 mL Intravenous Q12H   Continuous Infusions:   . sodium chloride 10 mL/hr at 01/20/12 2145  . sodium chloride    . nitroGLYCERIN 5 mcg/min (01/21/12 1700)    Principal Problem:  *Chest pain syndrome Active Problems:  Thyroid disease  H/O bladder repair surgery  Family history of cardiovascular disease  Anemia  Interstitial edema seen on chest CT 01/20/12  Respiratory failure with hypoxia  CHF (congestive heart failure)  Normocytic anemia  Hypertriglyceridemia     Murray Hodgkins,  MD  [facility] Team 1 Pager [phone]. If 7PM-7AM, please contact night-coverage at [email or web address], password [facility] 01/22/2012, 9:15 AM  LOS: 2 days   Time spent: 20 minutes

## 2012-01-22 NOTE — H&P (View-Only) (Signed)
PROGRESS NOTE  Subjective:   Pt was admitted with dyspnea - possibly due to CHF.  Diuresed 1.4 liters last night.  Still has some chest tightness.  Cardiac enzymes are negative.  myoview revealed some minor irregularities - echo showed normal LV function EF 55-60%.  Has continued to have some chest discomfort.   Objective:    Vital Signs:   Temp:  [98 F (36.7 C)-98.5 F (36.9 C)] 98 F (36.7 C) (08/14 0338) Pulse Rate:  [69-92] 78  (08/14 0621) Resp:  [13-20] 20  (08/14 0621) BP: (98-131)/(48-69) 116/68 mmHg (08/14 0621) SpO2:  [96 %-98 %] 96 % (08/14 0340) Weight:  [132 lb 7.9 oz (60.1 kg)] 132 lb 7.9 oz (60.1 kg) (08/14 0500)  Last BM Date: 01/19/12   24-hour weight change: Weight change: -6 lb 8.1 oz (-2.95 kg)  Weight trends: Filed Weights   01/20/12 1753 01/21/12 0500 01/22/12 0500  Weight: 143 lb 1.3 oz (64.9 kg) 138 lb 0.1 oz (62.6 kg) 132 lb 7.9 oz (60.1 kg)    Intake/Output:  08/13 0701 - 08/14 0700 In: 103.5 [I.V.:103.5] Out: 1525 [Urine:1525]     Physical Exam: BP 116/68  Pulse 78  Temp 98 F (36.7 C) (Oral)  Resp 20  Ht 5' 2.5" (1.588 m)  Wt 132 lb 7.9 oz (60.1 kg)  BMI 23.85 kg/m2  SpO2 96%  General: Vital signs reviewed and noted. Well-developed, well-nourished, in no acute distress; alert, appropriate and cooperative .  Head: Normocephalic, atraumatic.  Eyes: conjunctivae/corneas clear.  EOM's intact.   Throat: normal  Neck: Supple. Normal carotids. No JVD  Lungs:  clear  Heart: Regular rate,  With normal  S1 S2. No murmurs, gallops or rubs  Abdomen:  Soft, non-tender, non-distended with normoactive bowel sounds. No hepatomegaly. No rebound/guarding. No abdominal masses.  Extremities: Distal pedal pulses are 2+ .  No edema.    Neurologic: A&O X3, CN II - XII are grossly intact. Motor strength is 5/5 in the all 4 extremities.  Psych: Responds to questions appropriately with normal affect.    Labs: BMET:  Basename 01/21/12 0515  01/20/12 1237  NA 137 139  K 3.5 3.9  CL 97 103  CO2 30 25  GLUCOSE 95 102*  BUN 15 18  CREATININE 0.91 0.88  CALCIUM 9.2 9.0  MG -- --  PHOS -- --    Liver function tests: No results found for this basename: AST:2,ALT:2,ALKPHOS:2,BILITOT:2,PROT:2,ALBUMIN:2 in the last 72 hours No results found for this basename: LIPASE:2,AMYLASE:2 in the last 72 hours  CBC:  Basename 01/20/12 1237  WBC 6.6  NEUTROABS 4.0  HGB 10.2*  HCT 30.9*  MCV 83.5  PLT 206    Cardiac Enzymes:  Basename 01/21/12 0515 01/20/12 2220 01/20/12 1648  CKTOTAL 77 72 57  CKMB 1.8 2.2 1.8  TROPONINI <0.30 <0.30 <0.30    Coagulation Studies:  Basename 01/20/12 1647  LABPROT 14.8  INR 1.14    Other: No components found with this basename: POCBNP:3  Basename 01/20/12 1237  DDIMER 0.85*    Basename 01/20/12 1647  HGBA1C 6.1*    Basename 01/21/12 0515  CHOL 192  HDL 46  LDLCALC 98  TRIG 239*  CHOLHDL 4.2    Basename 01/20/12 1647  TSH 1.945  T4TOTAL --  T3FREE --  THYROIDAB --   No results found for this basename: VITAMINB12,FOLATE,FERRITIN,TIBC,IRON,RETICCTPCT in the last 72 hours    Tele:  NSR.  Medications:    Infusions:    . sodium  chloride 10 mL/hr at 01/20/12 2145  . sodium chloride    . nitroGLYCERIN 5 mcg/min (01/21/12 1700)    Scheduled Medications:    . aspirin  324 mg Oral Pre-Cath  . aspirin EC  81 mg Oral Daily  . diazepam  5 mg Oral On Call  . enoxaparin (LOVENOX) injection  40 mg Subcutaneous Daily  . furosemide  20 mg Intravenous Q12H  .  morphine injection  2 mg Intravenous Once  . morphine      . regadenoson  0.4 mg Intravenous Once  . sodium chloride  3 mL Intravenous Q12H  . sodium chloride  3 mL Intravenous Q12H    Assessment/ Plan:    Chest pain syndrome (01/20/2012) Cardiac enzymes are negative so far.  She has continued to have chest pain.  myoview revealed some areas of attenuation but no specific areas of ischemia.    I think our  best option is to proceed with cath.  She has continued to have symptoms and the myoview was not normal.    Have discussed risks / benefits / options.  She understands and agrees to proceed.  Thyroid disease () TSH is normal  H/O bladder repair surgery ()   Family history of cardiovascular disease (01/20/2012) Trigs are elevated.  Anemia (01/20/2012)  Possible CHF - EF is normal - no evidence of systolic CHF.  There was no mention of diastolic dysfunction on the echo.    Disposition:  Length of Stay: 2  Thayer Headings, [name]., MD, [facility] 01/22/2012, 7:42 AM Office [phone] Pager [phone]

## 2012-01-22 NOTE — Progress Notes (Signed)
 PROGRESS NOTE  Subjective:   Pt was admitted with dyspnea - possibly due to CHF.  Diuresed 1.4 liters last night.  Still has some chest tightness.  Cardiac enzymes are negative.  myoview revealed some minor irregularities - echo showed normal LV function EF 55-60%.  Has continued to have some chest discomfort.   Objective:    Vital Signs:   Temp:  [98 F (36.7 C)-98.5 F (36.9 C)] 98 F (36.7 C) (08/14 0338) Pulse Rate:  [69-92] 78  (08/14 0621) Resp:  [13-20] 20  (08/14 0621) BP: (98-131)/(48-69) 116/68 mmHg (08/14 0621) SpO2:  [96 %-98 %] 96 % (08/14 0340) Weight:  [132 lb 7.9 oz (60.1 kg)] 132 lb 7.9 oz (60.1 kg) (08/14 0500)  Last BM Date: 01/19/12   24-hour weight change: Weight change: -6 lb 8.1 oz (-2.95 kg)  Weight trends: Filed Weights   01/20/12 1753 01/21/12 0500 01/22/12 0500  Weight: 143 lb 1.3 oz (64.9 kg) 138 lb 0.1 oz (62.6 kg) 132 lb 7.9 oz (60.1 kg)    Intake/Output:  08/13 0701 - 08/14 0700 In: 103.5 [I.V.:103.5] Out: 1525 [Urine:1525]     Physical Exam: BP 116/68  Pulse 78  Temp 98 F (36.7 C) (Oral)  Resp 20  Ht 5' 2.5" (1.588 m)  Wt 132 lb 7.9 oz (60.1 kg)  BMI 23.85 kg/m2  SpO2 96%  General: Vital signs reviewed and noted. Well-developed, well-nourished, in no acute distress; alert, appropriate and cooperative .  Head: Normocephalic, atraumatic.  Eyes: conjunctivae/corneas clear.  EOM's intact.   Throat: normal  Neck: Supple. Normal carotids. No JVD  Lungs:  clear  Heart: Regular rate,  With normal  S1 S2. No murmurs, gallops or rubs  Abdomen:  Soft, non-tender, non-distended with normoactive bowel sounds. No hepatomegaly. No rebound/guarding. No abdominal masses.  Extremities: Distal pedal pulses are 2+ .  No edema.    Neurologic: A&O X3, CN II - XII are grossly intact. Motor strength is 5/5 in the all 4 extremities.  Psych: Responds to questions appropriately with normal affect.    Labs: BMET:  Basename 01/21/12 0515  01/20/12 1237  NA 137 139  K 3.5 3.9  CL 97 103  CO2 30 25  GLUCOSE 95 102*  BUN 15 18  CREATININE 0.91 0.88  CALCIUM 9.2 9.0  MG -- --  PHOS -- --    Liver function tests: No results found for this basename: AST:2,ALT:2,ALKPHOS:2,BILITOT:2,PROT:2,ALBUMIN:2 in the last 72 hours No results found for this basename: LIPASE:2,AMYLASE:2 in the last 72 hours  CBC:  Basename 01/20/12 1237  WBC 6.6  NEUTROABS 4.0  HGB 10.2*  HCT 30.9*  MCV 83.5  PLT 206    Cardiac Enzymes:  Basename 01/21/12 0515 01/20/12 2220 01/20/12 1648  CKTOTAL 77 72 57  CKMB 1.8 2.2 1.8  TROPONINI <0.30 <0.30 <0.30    Coagulation Studies:  Basename 01/20/12 1647  LABPROT 14.8  INR 1.14    Other: No components found with this basename: POCBNP:3  Basename 01/20/12 1237  DDIMER 0.85*    Basename 01/20/12 1647  HGBA1C 6.1*    Basename 01/21/12 0515  CHOL 192  HDL 46  LDLCALC 98  TRIG 239*  CHOLHDL 4.2    Basename 01/20/12 1647  TSH 1.945  T4TOTAL --  T3FREE --  THYROIDAB --   No results found for this basename: VITAMINB12,FOLATE,FERRITIN,TIBC,IRON,RETICCTPCT in the last 72 hours    Tele:  NSR.  Medications:    Infusions:    . sodium   chloride 10 mL/hr at 01/20/12 2145  . sodium chloride    . nitroGLYCERIN 5 mcg/min (01/21/12 1700)    Scheduled Medications:    . aspirin  324 mg Oral Pre-Cath  . aspirin EC  81 mg Oral Daily  . diazepam  5 mg Oral On Call  . enoxaparin (LOVENOX) injection  40 mg Subcutaneous Daily  . furosemide  20 mg Intravenous Q12H  .  morphine injection  2 mg Intravenous Once  . morphine      . regadenoson  0.4 mg Intravenous Once  . sodium chloride  3 mL Intravenous Q12H  . sodium chloride  3 mL Intravenous Q12H    Assessment/ Plan:    Chest pain syndrome (01/20/2012) Cardiac enzymes are negative so far.  She has continued to have chest pain.  myoview revealed some areas of attenuation but no specific areas of ischemia.    I think our  best option is to proceed with cath.  She has continued to have symptoms and the myoview was not normal.    Have discussed risks / benefits / options.  She understands and agrees to proceed.  Thyroid disease () TSH is normal  H/O bladder repair surgery ()   Family history of cardiovascular disease (01/20/2012) Trigs are elevated.  Anemia (01/20/2012)  Possible CHF - EF is normal - no evidence of systolic CHF.  There was no mention of diastolic dysfunction on the echo.    Disposition:  Length of Stay: 2  Thayer Headings, [name]., MD, [facility] 01/22/2012, 7:42 AM Office [phone] Pager [phone]

## 2012-01-22 NOTE — Interval H&P Note (Signed)
History and Physical Interval Note:  01/22/2012 3:24 PM  Tina Calderon  has presented today for surgery, with the diagnosis of Chest pain  The various methods of treatment have been discussed with the patient and family. After consideration of risks, benefits and other options for treatment, the patient has consented to  Procedure(s) (LRB): LEFT HEART CATHETERIZATION WITH CORONARY ANGIOGRAM (N/A) as a surgical intervention .  The patient's history has been reviewed, patient examined, no change in status, stable for surgery.  I have reviewed the patient's chart and labs.  Questions were answered to the patient's satisfaction.     [name]

## 2012-01-23 DIAGNOSIS — I509 Heart failure, unspecified: Secondary | ICD-10-CM

## 2012-01-23 DIAGNOSIS — I5033 Acute on chronic diastolic (congestive) heart failure: Secondary | ICD-10-CM

## 2012-01-23 LAB — BASIC METABOLIC PANEL
CO2: 31 mEq/L (ref 19–32)
Chloride: 95 mEq/L — ABNORMAL LOW (ref 96–112)
GFR calc Af Amer: 64 mL/min — ABNORMAL LOW (ref >90)
GFR calc non Af Amer: 56 mL/min — ABNORMAL LOW (ref >90)
Potassium: 3.4 mEq/L — ABNORMAL LOW (ref 3.5–5.1)
Sodium: 140 mEq/L (ref 135–145)

## 2012-01-23 MED ORDER — SODIUM CHLORIDE 0.9 % IV BOLUS (SEPSIS)
250.0000 mL | Freq: Once | INTRAVENOUS | Status: AC
  Administered 2012-01-23: 18:00:00 250 mL via INTRAVENOUS

## 2012-01-23 MED ORDER — POTASSIUM CHLORIDE CRYS ER 20 MEQ PO TBCR
20.0000 meq | EXTENDED_RELEASE_TABLET | Freq: Every day | ORAL | Status: DC
  Administered 2012-01-23 – 2012-01-24 (×2): 20 meq via ORAL
  Filled 2012-01-23 (×2): qty 1

## 2012-01-23 MED ORDER — LOSARTAN POTASSIUM 25 MG PO TABS
25.0000 mg | ORAL_TABLET | Freq: Every day | ORAL | Status: DC
  Administered 2012-01-23: 12:00:00 25 mg via ORAL
  Filled 2012-01-23 (×2): qty 1

## 2012-01-23 MED ORDER — FUROSEMIDE 40 MG PO TABS
40.0000 mg | ORAL_TABLET | Freq: Every day | ORAL | Status: DC
  Administered 2012-01-23: 12:00:00 40 mg via ORAL
  Filled 2012-01-23: qty 1

## 2012-01-23 MED ORDER — CARVEDILOL 3.125 MG PO TABS
3.1250 mg | ORAL_TABLET | Freq: Two times a day (BID) | ORAL | Status: DC
  Administered 2012-01-23 – 2012-01-24 (×3): 3.125 mg via ORAL
  Filled 2012-01-23 (×5): qty 1

## 2012-01-23 NOTE — Progress Notes (Signed)
CARDIAC REHAB PHASE I   PRE:  Rate/Rhythm: 96 SR, after doning undergarments 131 ST    BP: sitting 133/61, after standing and sitting 146/70    SaO2: 99 RA  MODE:  Ambulation: 106 ft   POST:  Rate/Rhythm: 141 ST with occ PACs    BP: sitting 149/82     SaO2: 98-99 RA  Pt with significant ST with activity. HR up to 131 before walk after doning garments. Pt SOB and felt "wobbly".  BP and SaO2 stable. Allowed HR to decrease and pt to feel better. Ambulated pt 100 ft and HR gradually increased to 130s. Rested and HR continued to 141 ST with PACs. Pt c/o SOB, extreme fatigue and then progressed to feeling as though she would pass out. Called for a chair. Pt slowly began to feel better in chair with feet up as HR slowly decreased back to high 90s. Rolled pt back to room. SaO2 remained 98-99 RA. Left pt in recliner with feet elevated. Sts she feels better, just really tired. Did not have any chest tightness or arm fatigue during event. Sts this episode was similar to how she was feeling at home without the chest tightness. Will f/u. [id number]  Darrick Meigs CES, ACSM

## 2012-01-23 NOTE — Progress Notes (Signed)
[facility] PROGRESS NOTE  Tina Calderon QVZ:[id number] DOB: 1940-08-28 DOA: 01/20/2012 PCP: [name], MD  Assessment/Plan: Acute respiratory failure with hypoxia--resolved. Etiology unclear--diastolic CHF? (not mentioned on echocardiogram).   Lightheaded and tachycardic on ambulation Now orthostatic likely from diuresis- unfortunately will need to give IVF- will give a small bolus of 250 cc over an hr and then recheck orthostatics.  Chest pain--Cardiac workup negative--LHC with mild coronary plaque. CTA chest negative. Consider outpatient non-cardiac chest pain evaluation.  Possible diastolic CHF--will need to hold Lasix- repeat CXR tomorrow as she continues to have crackles in RLL.  Normocytic anemia--appears to be at baseline. Follow-up as outpatient.  Hypertriglyceridemia--follow-up as an outpatient.  Hypothyroidism--TSH normal. Continue Synthroid.  Code Status: Full code Family Communication: discussed with husband and sister at bedside Disposition Plan: home 8/15?  Debbe Odea, MD  [facility] Team 1 Pager [phone]  If 7PM-7AM, please contact night-coverage at [email or web address], password [facility] 01/23/2012, 4:59 PM  LOS: 3 days   Brief narrative: 71 year old woman presented with 2-3 months of chest discomfort and DOE. The significantly worsened on Sunday when she tried to take the trash can approximately 150 feet outside her house. The patient states that she had to stop 3 times due to experiencing chest discomfort and sugars of breath.   Consultants:  Cardiology  Procedures:  8/13 2d echocardiogram--LVEF 55-60%. Normal wall motion.  8/13 Lexiscan--1. Reduced activity in the lateral wall and anteroseptal walls on stress and rest images could represent scar or motion artifact. No inducible ischemia observed. 2. The left ventricular ejection fraction is thought to be markedly overestimated based on counting statistics. We do not demonstrate evidence of  interventricular septal thickening on yesterday's chest CT.  8/13 LHC--mild mild coronary plaque  HPI/Subjective: Chest pain resolved but now very lightheaded with ambulation- tech notes tachycardia with ambulation  Objective: Filed Vitals:   01/23/12 1311 01/23/12 1552 01/23/12 1555 01/23/12 1557  BP: 123/78 113/65 114/63 103/54  Pulse: 82     Temp: 97.5 F (36.4 C)     TempSrc: Oral     Resp: _0 Height:      Weight:      SpO2: 100% 98% 100% 99%    Intake/Output Summary (Last 24 hours) at 01/23/12 1659 Last data filed at 01/23/12 1300  Gross per 24 hour  Intake    750 ml  Output   1300 ml  Net   -550 ml     Exam:   General:  Appears calm and comfortable  Cardiovascular: RRR, no m/r/g. No LE edema.  Respiratory:  posterior rales r>l. Normal respiratory effort.  Data Reviewed: Basic Metabolic Panel:  Lab 41/66/06 0838 01/21/12 0515 01/20/12 1237  NA 140 137 139  K 3.4* 3.5 3.9  CL 95* 97 103  CO2 _1 GLUCOSE 155* 95 102*  BUN _2 CREATININE 0.99 0.91 0.88  CALCIUM 9.9 9.2 9.0  MG -- -- --  PHOS -- -- --   CBC:  Lab 01/20/12 1237  WBC 6.6  NEUTROABS 4.0  HGB 10.2*  HCT 30.9*  MCV 83.5  PLT 206   Cardiac Enzymes:  Lab 01/21/12 0515 01/20/12 2220 01/20/12 1648  CKTOTAL 77 72 57  CKMB 1.8 2.2 1.8  CKMBINDEX -- -- --  TROPONINI <0.30 <0.30 <0.30   Recent Results (from the past 240 hour(s))  URINE CULTURE     Status: Normal   Collection Time   01/15/12  9:50 AM  Component Value Range Status Comment   Colony Count NO GROWTH   Final    Organism ID, Bacteria NO GROWTH   Final   MRSA PCR SCREENING     Status: Normal   Collection Time   01/20/12  5:40 PM      Component Value Range Status Comment   MRSA by PCR NEGATIVE  NEGATIVE Final     Studies: Dg Chest 2 View  01/20/2012  *RADIOLOGY REPORT*  Clinical Data: Chest pain.  Short of breath.  CHEST - 2 VIEW  Comparison: 05/01/2010  Findings: Moderate cardiomegaly.   Vascular congestion.  Mild edema suspected.  Left basilar atelectasis.  No pneumothorax or pleural effusion. Hiatal hernia is stable.  IMPRESSION: Mild CHF.  Left basilar atelectasis.  Original Report Authenticated By: Jamas Lav, M.D.   Ct Angio Chest W/cm &/or Wo Cm  01/20/2012  *RADIOLOGY REPORT*  Clinical Data: Chest pain, short of breath  CT ANGIOGRAPHY CHEST  Technique:  Multidetector CT imaging of the chest using the standard protocol during bolus administration of intravenous contrast. Multiplanar reconstructed images including MIPs were obtained and reviewed to evaluate the vascular anatomy.  Contrast: 164m OMNIPAQUE IOHEXOL 350 MG/ML SOLN  Comparison: Chest radiograph 05/01/2010  Findings: There are no filling defects within the pulmonary arteries to suggest acute pulmonary embolism.  No acute findings of the aorta great vessels.  Coronary calcifications are present.  There is interlobular septal thickening at the lung bases and ground-glass opacities.  No clear evidence of pneumonia.  No pleural fluid.  No pneumothorax.  Limited view of the upper abdomen unremarkable.  There is a large hiatal hernia present.  No view of the skeleton is unremarkable.  IMPRESSION:  1.  No evidence acute pulmonary embolism. 2. Interlobular septal thickening suggest interstitial edema. 3.  Coronary calcifications. 4.  Large hiatal hernia.  Original Report Authenticated By: SSuzy Bouchard M.D.   Nm Myocar Multi W/spect W/wall Motion / Ef  01/21/2012  Clinical Data:  Chest tightness.  Dyspnea.  Technique:  Standard myocardial SPECT imaging performed after resting intravenous injection of [id number].  Subsequently, stress in the form of Lexiscan was administered under the supervision of the Cardiology staff.  At peak stress, Tc-9975mtrofosmin was injected intravenously and standard myocardial SPECT imaging performed.  Quantitative gated imaging also performed to evaluate left ventricular wall motion and  estimate left ventricular ejection fraction.  Radiopharmaceutical: Tc-45m78mrofosmin, 10 mCi at rest and 30 mCi at stress.  Comparison:  None  MYOCARDIAL IMAGING WITH SPECT (REST AND STRESS)  Findings:  Reduced activity is present in the lateral wall and anterior septal wall.  Given the streak-like transverse artifact on images, I suspect that a component of this is likely due to motion artifact.  However, the appearance is on stress and rest images, and could also reflect left ventricular scarring.  No inducible ischemia is observed.  LEFT VENTRICULAR EJECTION FRACTION  Findings:  Left ventricular end-diastolic volume is 30 ml cc.  End- systolic volume is markedly under estimated at 1 ml cc.  Derived LV ejection fraction is 97%, markedly overestimated.  GATED LEFT VENTRICULAR WALL MOTION STUDY  Findings:  Low ventricular wall motion wall thickening appear within normal limits, although degree of contraction is likely overestimated.  IMPRESSION:  1.  Reduced activity in the lateral wall and anteroseptal walls on stress and rest images could represent scar or motion artifact.  No inducible ischemia observed. 2.  The left ventricular ejection fraction is thought to be  markedly overestimated based on counting statistics.  We do not demonstrate evidence of interventricular septal thickening on yesterday's chest CT.  Original Report Authenticated By: Carron Curie, M.D.    Scheduled Meds:    . aspirin EC  81 mg Oral Daily  . carvedilol  3.125 mg Oral BID WC  . furosemide  40 mg Oral Daily  . losartan  25 mg Oral Daily  . potassium chloride  20 mEq Oral Daily  . sodium chloride  250 mL Intravenous Once  . sodium chloride  3 mL Intravenous Q12H  . DISCONTD: furosemide  20 mg Intravenous Q12H  . DISCONTD: sodium chloride  3 mL Intravenous Q12H   Continuous Infusions:    . sodium chloride 20 mL/hr (01/22/12 0700)  . sodium chloride 50 mL/hr at 01/22/12 2100  . DISCONTD: sodium chloride 75 mL/hr  (01/22/12 0800)    Principal Problem:  *Chest pain syndrome Active Problems:  Interstitial edema seen on chest CT 01/20/12  Thyroid disease  H/O bladder repair surgery  Family history of cardiovascular disease  Anemia  Respiratory failure with hypoxia  CHF (congestive heart failure)  Normocytic anemia  Hypertriglyceridemia  Acute on chronic diastolic CHF (congestive heart failure), NYHA class 3

## 2012-01-23 NOTE — Progress Notes (Signed)
PROGRESS NOTE  Subjective:   Pt was admitted with dyspnea - possibly due to CHF.  Diuresed 1.4 liters last night.  Still has some chest tightness.  Cardiac enzymes are negative.  Cath revealed non-obstructive disease. Normal LV function.  Doing lots better. Ready to go home soon.   Objective:    Vital Signs:   Temp:  [97.5 F (36.4 C)-98.4 F (36.9 C)] 97.5 F (36.4 C) (08/15 0459) Pulse Rate:  [62-96] 96  (08/15 0459) Resp:  [13-30] 30  (08/15 0459) BP: (105-131)/(53-74) 130/71 mmHg (08/15 0459) SpO2:  [94 %-100 %] 97 % (08/15 0459) Weight:  [135 lb 9.3 oz (61.5 kg)] 135 lb 9.3 oz (61.5 kg) (08/15 0015)  Last BM Date: 01/22/12   24-hour weight change: Weight change: 3 lb 1.4 oz (1.4 kg)  Weight trends: Filed Weights   01/21/12 0500 01/22/12 0500 01/23/12 0015  Weight: 138 lb 0.1 oz (62.6 kg) 132 lb 7.9 oz (60.1 kg) 135 lb 9.3 oz (61.5 kg)    Intake/Output:  08/14 0701 - 08/15 0700 In: 650 [P.O.:480; I.V.:170] Out: 1725 [Urine:1725]     Physical Exam: BP 130/71  Pulse 96  Temp 97.5 F (36.4 C) (Oral)  Resp 30  Ht 5' 2.5" (1.588 m)  Wt 135 lb 9.3 oz (61.5 kg)  BMI 24.40 kg/m2  SpO2 97%  General: Vital signs reviewed and noted. Well-developed, well-nourished, in no acute distress; alert, appropriate and cooperative .  Head: Normocephalic, atraumatic.  Eyes: conjunctivae/corneas clear.  EOM's intact.   Throat: normal  Neck: Supple. Normal carotids. No JVD  Lungs:  A few basilar rales, better  Heart: Regular rate,  With normal  S1 S2. No murmurs, gallops or rubs  Abdomen:  Soft, non-tender, non-distended with normoactive bowel sounds. No hepatomegaly. No rebound/guarding. No abdominal masses.  Extremities: Distal pedal pulses are 2+ .  No edema.    Neurologic: A&O X3, CN II - XII are grossly intact. Motor strength is 5/5 in the all 4 extremities.  Psych: Responds to questions appropriately with normal affect.    Labs: BMET:  Basename 01/21/12 0515  01/20/12 1237  NA 137 139  K 3.5 3.9  CL 97 103  CO2 30 25  GLUCOSE 95 102*  BUN 15 18  CREATININE 0.91 0.88  CALCIUM 9.2 9.0  MG -- --  PHOS -- --    Liver function tests: No results found for this basename: AST:2,ALT:2,ALKPHOS:2,BILITOT:2,PROT:2,ALBUMIN:2 in the last 72 hours No results found for this basename: LIPASE:2,AMYLASE:2 in the last 72 hours  CBC:  Basename 01/20/12 1237  WBC 6.6  NEUTROABS 4.0  HGB 10.2*  HCT 30.9*  MCV 83.5  PLT 206    Cardiac Enzymes:  Basename 01/21/12 0515 01/20/12 2220 01/20/12 1648  CKTOTAL 77 72 57  CKMB 1.8 2.2 1.8  TROPONINI <0.30 <0.30 <0.30    Coagulation Studies:  Basename 01/20/12 1647  LABPROT 14.8  INR 1.14    Other: No components found with this basename: POCBNP:3  Basename 01/20/12 1237  DDIMER 0.85*    Basename 01/20/12 1647  HGBA1C 6.1*    Basename 01/21/12 0515  CHOL 192  HDL 46  LDLCALC 98  TRIG 239*  CHOLHDL 4.2    Basename 01/20/12 1647  TSH 1.945  T4TOTAL --  T3FREE --  THYROIDAB --   No results found for this basename: VITAMINB12,FOLATE,FERRITIN,TIBC,IRON,RETICCTPCT in the last 72 hours    Tele:  NSR.  Medications:    Infusions:    . sodium  chloride 20 mL/hr (01/22/12 0700)  . sodium chloride 50 mL/hr at 01/22/12 2100  . DISCONTD: sodium chloride 75 mL/hr (01/22/12 0800)  . DISCONTD: nitroGLYCERIN Stopped (01/22/12 1548)    Scheduled Medications:    . aspirin EC  81 mg Oral Daily  . diazepam  5 mg Oral On Call  . furosemide  40 mg Oral Daily  . heparin      . lidocaine      . midazolam      . midazolam      . nitroGLYCERIN      . potassium chloride  20 mEq Oral Daily  . sodium chloride  3 mL Intravenous Q12H  . DISCONTD: enoxaparin (LOVENOX) injection  40 mg Subcutaneous Daily  . DISCONTD: furosemide  20 mg Intravenous Q12H  . DISCONTD: sodium chloride  3 mL Intravenous Q12H    Assessment/ Plan:   1. Acute on chronic diastolic CHF.  No siginificant CAD. Normal  LV function.  She appears to have diastolic dysfunction.  She has diuresed nicely .  Have added coreg, losartan, changed lasix to PO. Added KCl.  Will check bmp today.    Ambulate with cardiac rehab.  If she does well and maintains her sats, she should be able to go home today - tomorrow if she still needs some diuresis  To see our NP Cecille Rubin ) in 1-2 weeks.  To see me in 1-2 months. Call [phone] for apt.    Disposition:  Length of Stay: 3  Thayer Headings, [name]., MD, [facility] 01/23/2012, 7:41 AM Office [phone] Pager [phone]

## 2012-01-24 ENCOUNTER — Inpatient Hospital Stay (HOSPITAL_COMMUNITY): Payer: Medicare Other

## 2012-01-24 DIAGNOSIS — R0902 Hypoxemia: Secondary | ICD-10-CM

## 2012-01-24 DIAGNOSIS — D649 Anemia, unspecified: Secondary | ICD-10-CM

## 2012-01-24 DIAGNOSIS — R918 Other nonspecific abnormal finding of lung field: Secondary | ICD-10-CM

## 2012-01-24 DIAGNOSIS — I951 Orthostatic hypotension: Secondary | ICD-10-CM

## 2012-01-24 DIAGNOSIS — J9600 Acute respiratory failure, unspecified whether with hypoxia or hypercapnia: Principal | ICD-10-CM | POA: Insufficient documentation

## 2012-01-24 DIAGNOSIS — J811 Chronic pulmonary edema: Secondary | ICD-10-CM

## 2012-01-24 DIAGNOSIS — I5033 Acute on chronic diastolic (congestive) heart failure: Secondary | ICD-10-CM

## 2012-01-24 LAB — BASIC METABOLIC PANEL
BUN: 23 mg/dL (ref 6–23)
Chloride: 102 mEq/L (ref 96–112)
GFR calc non Af Amer: 60 mL/min — ABNORMAL LOW (ref >90)
Glucose, Bld: 98 mg/dL (ref 70–99)
Potassium: 3.7 mEq/L (ref 3.5–5.1)

## 2012-01-24 LAB — SEDIMENTATION RATE: Sed Rate: 15 mm/hr (ref 0–22)

## 2012-01-24 LAB — ANGIOTENSIN CONVERTING ENZYME: Angiotensin-Converting Enzyme: 38 U/L (ref 8–52)

## 2012-01-24 LAB — C-REACTIVE PROTEIN: CRP: <0.5 mg/dL — ABNORMAL LOW (ref <0.60)

## 2012-01-24 LAB — RHEUMATOID FACTOR: Rhuematoid fact SerPl-aCnc: 11 IU/mL (ref <=14)

## 2012-01-24 MED ORDER — ALPRAZOLAM 0.25 MG PO TABS
0.2500 mg | ORAL_TABLET | Freq: Two times a day (BID) | ORAL | Status: DC | PRN
  Administered 2012-01-24: 12:00:00 0.25 mg via ORAL
  Filled 2012-01-24: qty 1

## 2012-01-24 MED ORDER — HYDROCHLOROTHIAZIDE 25 MG PO TABS: 25.0000 mg | ORAL_TABLET | Freq: Every morning | ORAL | Status: DC

## 2012-01-24 NOTE — Progress Notes (Signed)
CARDIAC REHAB PHASE I   PRE:  Rate/Rhythm: 79 SR    BP: sitting 130/66    SaO2: 99 RA  MODE:  Ambulation: 200 ft   POST:  Rate/Rhythm: 111 ST (mostly around 105 ST)    BP: sitting 127/62     SaO2: 99 RA  Tolerated much better. C/o leg fatigue but no SOB or dizziness. Feels appropriate. Encouraged low sodium diet, daily wts and walking daily.  [id number]  Darrick Meigs CES, ACSM

## 2012-01-24 NOTE — Discharge Summary (Signed)
Physician Discharge Summary  JAKI STEPTOE MLY:[id number] DOB: [birth year] DOA: 01/20/2012  PCP: Maximino Greenland, MD  Admit date: 01/20/2012 Discharge date: 01/24/2012  Recommendations for Outpatient Follow-up:  1. Cardiology f/u next week for weight, assessment for fluid overload, BP and HR f/u F/u with pulmonary for blood work results and further w/u   Discharge Diagnoses:  Principal Problem:  *Acute respiratory failure Active Problems:  Pulmonary infiltrate  Orthostatic hypotension  Chest pain syndrome  Thyroid disease  H/O bladder repair surgery  Anemia  Normocytic anemia  Hypertriglyceridemia   Discharge Condition: stable  Diet recommendation: heart healthy  Filed Weights   01/22/12 0500 01/23/12 0015 01/24/12 0014  Weight: 60.1 kg (132 lb 7.9 oz) 61.5 kg (135 lb 9.3 oz) 63.7 kg (140 lb 6.9 oz)    History of present illness:  71 year old woman presented with 2-3 months of chest discomfort and DOE. The significantly worsened on Sunday when she tried to take the trash can approximately 150 feet outside her house. The patient states that she had to stop 3 times due to experiencing chest discomfort and sugars of breath.   Hospital Course:  Acute respiratory failure with hypoxia-- Etiology unclear--diastolic CHF? (not mentioned on echocardiogram)- Despite aggressive diuresis to the point of orthostatic hypotension, crackles at bases remain- HRCT consistent with ILD- appropriate blood work ordered by Dr Nelda Marseille- oupt f/u with Dr Alva Garnet arranged for further work up. Per patient, her brother and sister both died of "pulmonary fibrosis" but the fact that they were both heavy smokers suggests a COPD component to their fibrosis.   Lightheaded and tachycardic on ambulation  Now orthostatic likely from diuresis- given a small bolus yesterday - orthostatic symptoms resolved although orthostatic vitals are still positive- pt advised to hold HCTZ for the next few days or to resume if BP  rises above 120/80.   Hypotension Holding Coreg, Lasix and HCTZ for now- f/u as outpt for possible resumption of medications. - pt and husband have been explained the need to hold the medications currently and understands that follow up next week is necessary.   Chest pain--Cardiac workup negative--LHC with mild coronary plaque. CTA chest negative.  Possible diastolic CHF--will need to hold Lasix due to orthostasis- f/u with [employer] is next week. Pt is advised to start daily weights and call PCP if weight is rising rapidly and if she is becoming short of breath.   Normocytic anemia--appears to be at baseline. Follow-up as outpatient.   Hypertriglyceridemia--follow-up as an outpatient.   Hypothyroidism--TSH normal. Continue Synthroid.   Procedures:  Left heart cath - 8/14  Consultations:  [facility]  [facility]  Discharge Exam: Filed Vitals:   01/24/12 1200  BP: 123/67  Pulse: 71  Temp: 97.5 F (36.4 C)  Resp: 18   Filed Vitals:   01/24/12 0800 01/24/12 0803 01/24/12 0806 01/24/12 1200  BP: 135/72 123/70 112/67 123/67  Pulse: 64   71  Temp: 97.6 F (36.4 C)   97.5 F (36.4 C)  TempSrc: Oral   Oral  Resp: 18   18  Height:      Weight:      SpO2: 92%   100%    General: alert, no distress Cardiovascular: RRR, no murmurs Respiratory: bibasilar coarse crackles  Discharge Instructions  Discharge Orders    Future Appointments: Provider: Department: Dept Phone: Center:   01/30/2012 8:00 AM Burtis Junes, NP [facility] [phone] None   02/18/2012 2:15 PM Collene Gobble, MD [facility] [phone] None  03/31/2012 9:00 AM Thayer Headings, MD [facility] [phone] None     Future Orders Please Complete By Expires   Diet - low sodium heart healthy      Increase activity slowly      Discharge instructions      Comments:   Weight yourself daily-  Call your PCP if your weight is increasing in the next week or if  you are becoming short of breath.     Medication List  As of 01/24/2012  4:27 PM   TAKE these medications         calcium-vitamin D 500-200 MG-UNIT per tablet   Commonly known as: OSCAL WITH D   Take 1 tablet by mouth every morning.      Cranberry 1000 MG Caps   Take 1 capsule by mouth daily.      hydrochlorothiazide 25 MG tablet   Commonly known as: HYDRODIURIL   Take 1 tablet (25 mg total) by mouth every morning.      levothyroxine 100 MCG tablet   Commonly known as: SYNTHROID, LEVOTHROID   Take 100 mcg by mouth every morning.      loratadine 10 MG tablet   Commonly known as: CLARITIN   Take 10 mg by mouth every evening.      multivitamin with minerals Tabs   Take 1 tablet by mouth every morning. Centrum silver      promethazine 12.5 MG tablet   Commonly known as: PHENERGAN   Take 1 tablet (12.5 mg total) by mouth every 6 (six) hours as needed for nausea.           Follow-up Information    Follow up with Truitt Merle, NP. (August 22nd at  8:00 am)    Contact information:   [address] [place]       Follow up with Darden Amber., MD. (October 22nd at 9:00 am)    Contact information:   [address] [phone]       Follow up with Collene Gobble., MD on 02/18/2012. (Appt at 2:15 pm)    Contact information:   [address] [place] [phone]           The results of significant diagnostics from this hospitalization (including imaging, microbiology, ancillary and laboratory) are listed below for reference.    Significant Diagnostic Studies: Dg Chest 2 View  01/24/2012  *RADIOLOGY REPORT*  Clinical Data: Crackles at the base  CHEST - 2 VIEW  Comparison: Chest radiograph 01/20/2012, CT 01/20/2012  Findings: Normal cardiac silhouette.  There is bibasilar linear scarring atelectasis and  interstitial thickening which is not changed from comparison CT.  No effusion, infiltrate,  or pneumothorax.  IMPRESSION:  1.  Stable bibasilar opacities representing interstitial thickening on comparison CT. 2.  Some improvement in interstitial edema pattern seen on chest radiograph 01/20/2012.  Original Report Authenticated By: Suzy Bouchard, M.D.   Dg Chest 2 View  01/20/2012  *RADIOLOGY REPORT*  Clinical Data: Chest pain.  Short of breath.  CHEST - 2 VIEW  Comparison: 05/01/2010  Findings: Moderate cardiomegaly.  Vascular congestion.  Mild edema suspected.  Left basilar atelectasis.  No pneumothorax or pleural effusion. Hiatal hernia is stable.  IMPRESSION: Mild CHF.  Left basilar atelectasis.  Original Report Authenticated By: Jamas Lav, M.D.   Ct Chest Wo Contrast  01/24/2012  *RADIOLOGY REPORT*  Clinical  Data: Evaluate for potential pulmonary fibrosis. Worsening chest pain and shortness of breath for the past 6 months.  CT CHEST WITHOUT CONTRAST  Technique:  Multidetector CT imaging of the chest was performed following the standard protocol without IV contrast.  Comparison: Chest c t 01/20/2012.  CT of the abdomen and pelvis 09/19/2009.  Findings:  Mediastinum: Heart size is mildly enlarged. There is atherosclerosis of the thoracic aorta, the great vessels of the mediastinum and the coronary arteries, including calcified atherosclerotic plaque in the the left main, left anterior descending and left circumflex coronary arteries. No pathologically enlarged mediastinal or hilar lymph nodes. Please note that accurate exclusion of hilar adenopathy is limited on noncontrast CT scans.  Moderate - large hiatal hernia.  Lungs/Pleura: There are areas of subpleural reticulation throughout the lungs bilaterally, most pronounced in the lung bases.  Also noted in the lung bases are areas of ground-glass attenuation, architectural distortion and some very mild cylindrical traction bronchiectasis, as well as  marked thickening of the peribronchovascular interstitium.  These findings are slightly increased when compared to remote prior examination from 09/19/2009.  No frank honeycombing is identified at this time, although there is a suggestion of some early mild honeycombing in the lateral aspect of the right lower lobe (images 37 and 38 of series 3).  No acute consolidative airspace disease.  No pleural effusions.  No definite suspicious appearing pulmonary nodules or masses.  Inspiratory and expiratory phase imaging is remarkable for areas of air trapping in the lower lobes of the lungs bilaterally, suggesting small airways disease.  Upper Abdomen: Subcentimeter low attenuation lesion in segment 2 of the liver is not characterize but is unchanged compared to 09/19/2009, and is therefore favored to represent a benign lesion such as a small cyst.  Musculoskeletal: There are no aggressive appearing lytic or blastic lesions noted in the visualized portions of the skeleton. Orthopedic fixation hardware in the lower cervical spine is incompletely visualized.  IMPRESSION: 1.  The appearance of the lungs does suggest an underlying interstitial lung disease.  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  Alternatively, this could represent fibrotic phase nonspecific interstitial pneumonia (NSIP). 2.  There is also evidence of air trapping in the lung bases bilaterally, suggesting some small airways disease. 3.  Moderate - large sized hiatal hernia. 4.  Mild cardiomegaly. 5.  Atherosclerosis, including left main and two-vessel coronary artery disease.  Original Report Authenticated By: Etheleen Mayhew, M.D.   Ct Angio Chest W/cm &/or Wo  Cm  01/20/2012  *RADIOLOGY REPORT*  Clinical Data: Chest pain, short of breath  CT ANGIOGRAPHY CHEST  Technique:  Multidetector CT imaging of the chest using the standard protocol during bolus administration of intravenous contrast. Multiplanar reconstructed images including MIPs were obtained and reviewed to evaluate the vascular anatomy.  Contrast: 162m OMNIPAQUE IOHEXOL 350 MG/ML SOLN  Comparison: Chest radiograph 05/01/2010  Findings: There are no filling defects within the pulmonary arteries to suggest acute pulmonary embolism.  No acute findings of the aorta great vessels.  Coronary calcifications are present.  There is interlobular septal thickening at the lung bases and ground-glass opacities.  No clear evidence of pneumonia.  No pleural fluid.  No pneumothorax.  Limited view of the upper abdomen unremarkable.  There  is a large hiatal hernia present.  No view of the skeleton is unremarkable.  IMPRESSION:  1.  No evidence acute pulmonary embolism. 2. Interlobular septal thickening suggest interstitial edema. 3.  Coronary calcifications. 4.  Large hiatal hernia.  Original Report Authenticated By: Suzy Bouchard, M.D.   Nm Myocar Multi W/spect W/wall Motion / Ef  01/21/2012  Clinical Data:  Chest tightness.  Dyspnea.  Technique:  Standard myocardial SPECT imaging performed after resting intravenous injection of Tc-8mtetrofosmin.  Subsequently, stress in the form of Lexiscan was administered under the supervision of the Cardiology staff.  At peak stress, Tc-94metrofosmin was injected intravenously and standard myocardial SPECT imaging performed.  Quantitative gated imaging also performed to evaluate left ventricular wall motion and estimate left ventricular ejection fraction.  Radiopharmaceutical: Tc-9934mtrofosmin, 10 mCi at rest and 30 mCi at stress.  Comparison:  None  MYOCARDIAL IMAGING WITH SPECT (REST AND STRESS)  Findings:  Reduced activity is present in the lateral wall and anterior septal  wall.  Given the streak-like transverse artifact on images, I suspect that a component of this is likely due to motion artifact.  However, the appearance is on stress and rest images, and could also reflect left ventricular scarring.  No inducible ischemia is observed.  LEFT VENTRICULAR EJECTION FRACTION  Findings:  Left ventricular end-diastolic volume is 30 ml cc.  End- systolic volume is markedly under estimated at 1 ml cc.  Derived LV ejection fraction is 97%, markedly overestimated.  GATED LEFT VENTRICULAR WALL MOTION STUDY  Findings:  Low ventricular wall motion wall thickening appear within normal limits, although degree of contraction is likely overestimated.  IMPRESSION:  1.  Reduced activity in the lateral wall and anteroseptal walls on stress and rest images could represent scar or motion artifact.  No inducible ischemia observed. 2.  The left ventricular ejection fraction is thought to be markedly overestimated based on counting statistics.  We do not demonstrate evidence of interventricular septal thickening on yesterday's chest CT.  Original Report Authenticated By: WALCarron Curie.D.    Microbiology: Recent Results (from the past 240 hour(s))  URINE CULTURE     Status: Normal   Collection Time   01/15/12  9:50 AM      Component Value Range Status Comment   Colony Count NO GROWTH   Final    Organism ID, Bacteria NO GROWTH   Final   MRSA PCR SCREENING     Status: Normal   Collection Time   01/20/12  5:40 PM      Component Value Range Status Comment   MRSA by PCR NEGATIVE  NEGATIVE Final      Labs: Basic Metabolic Panel:  Lab 08/96/75/9138 01/23/12 0838 01/21/12 0515 01/20/12 1237  NA 140 140 137 139  K 3.7 3.4* 3.5 3.9  CL 102 95* 97 103  CO2 _0 GLUCOSE 98 155* 95 102*  BUN _1 CREATININE 0.93 0.99 0.91 0.88  CALCIUM 9.3 9.9 9.2 9.0  MG -- -- -- --  PHOS -- -- -- --   Liver Function Tests: No results found for this basename:  AST:5,ALT:5,ALKPHOS:5,BILITOT:5,PROT:5,ALBUMIN:5 in the last 168 hours No results found for this basename: LIPASE:5,AMYLASE:5 in the last 168 hours No results found for this basename: AMMONIA:5 in the last 168 hours CBC:  Lab 01/20/12 1237  WBC 6.6  NEUTROABS 4.0  HGB 10.2*  HCT 30.9*  MCV 83.5  PLT 206   Cardiac Enzymes:  Lab 01/21/12 0515 01/20/12 2220 01/20/12 1648  CKTOTAL 77 72 57  CKMB 1.8 2.2 1.8  CKMBINDEX -- -- --  TROPONINI <0.30 <0.30 <0.30   BNP: BNP (last 3 results) No results found for this basename: PROBNP:3 in the last 8760 hours CBG: No results found for this basename: GLUCAP:5 in the last 168 hours  Time coordinating discharge: > 45 minutes  Signed:  [place]  [facility] 01/24/2012, 4:27 PM

## 2012-01-24 NOTE — Progress Notes (Addendum)
PROGRESS NOTE  Subjective:   Pt was admitted with dyspnea - possibly due to CHF vs. Pulmonary issues.    Cath revealed non-obstructive disease. Normal LV function.  Doing lots better.  We have diuresed her fairly aggressively.  She had some orthostasis yesterday after diuresis.   Objective:    Vital Signs:   Temp:  [97.5 F (36.4 C)-98.1 F (36.7 C)] 98.1 F (36.7 C) (08/16 0531) Pulse Rate:  [75-98] 77  (08/16 0531) Resp:  [11-39] 15  (08/16 0531) BP: (100-123)/(53-78) 100/56 mmHg (08/16 0531) SpO2:  [94 %-100 %] 97 % (08/16 0531) Weight:  [140 lb 6.9 oz (63.7 kg)] 140 lb 6.9 oz (63.7 kg) (08/16 0014)  Last BM Date: 01/23/12   24-hour weight change: Weight change: 4 lb 13.6 oz (2.2 kg)  Weight trends: Filed Weights   01/22/12 0500 01/23/12 0015 01/24/12 0014  Weight: 132 lb 7.9 oz (60.1 kg) 135 lb 9.3 oz (61.5 kg) 140 lb 6.9 oz (63.7 kg)    Intake/Output:  08/15 0701 - 08/16 0700 In: 2233.3 [P.O.:720; I.V.:1263.3; IV Piggyback:250] Out: 600 [Urine:600]     Physical Exam: BP 100/56  Pulse 77  Temp 98.1 F (36.7 C) (Oral)  Resp 15  Ht 5' 2.5" (1.588 m)  Wt 140 lb 6.9 oz (63.7 kg)  BMI 25.28 kg/m2  SpO2 97%  General: Vital signs reviewed and noted. Well-developed, well-nourished, in no acute distress; alert, appropriate and cooperative .  Head: Normocephalic, atraumatic.  Eyes: conjunctivae/corneas clear.  EOM's intact.   Throat: normal  Neck: Supple. Normal carotids. No JVD  Lungs:  Bibasilar rales - worse than yesterday  Heart: Regular rate,  With normal  S1 S2. No murmurs, gallops or rubs  Abdomen:  Soft, non-tender, non-distended with normoactive bowel sounds. No hepatomegaly. No rebound/guarding. No abdominal masses.  Extremities: Distal pedal pulses are 2+ .  No edema.    Neurologic: A&O X3, CN II - XII are grossly intact. Motor strength is 5/5 in the all 4 extremities.  Psych: Responds to questions appropriately with normal affect.     Labs: BMET:  Basename 01/24/12 0538 01/23/12 0838  NA 140 140  K 3.7 3.4*  CL 102 95*  CO2 29 31  GLUCOSE 98 155*  BUN 23 21  CREATININE 0.93 0.99  CALCIUM 9.3 9.9  MG -- --  PHOS -- --    Liver function tests: No results found for this basename: AST:2,ALT:2,ALKPHOS:2,BILITOT:2,PROT:2,ALBUMIN:2 in the last 72 hours No results found for this basename: LIPASE:2,AMYLASE:2 in the last 72 hours  CBC: No results found for this basename: WBC:2,NEUTROABS:2,HGB:2,HCT:2,MCV:2,PLT:2 in the last 72 hours  Cardiac Enzymes: No results found for this basename: CKTOTAL:4,CKMB:4,TROPONINI:4 in the last 72 hours  Coagulation Studies: No results found for this basename: LABPROT:5,INR:5 in the last 72 hours     Tele:  NSR.  Medications:    Infusions:    . sodium chloride 20 mL/hr at 01/23/12 2200  . DISCONTD: sodium chloride 20 mL/hr (01/22/12 0700)    Scheduled Medications:    . aspirin EC  81 mg Oral Daily  . carvedilol  3.125 mg Oral BID WC  . losartan  25 mg Oral Daily  . potassium chloride  20 mEq Oral Daily  . sodium chloride  250 mL Intravenous Once  . DISCONTD: furosemide  20 mg Intravenous Q12H  . DISCONTD: furosemide  40 mg Oral Daily  . DISCONTD: sodium chloride  3 mL Intravenous Q12H    Assessment/ Plan:  1. Acute on chronic diastolic CHF.  No siginificant CAD. Normal LV function.  She appears to have diastolic dysfunction.  She has diuresed nicely .  Added coreg and losartan yesterday - these may have contributed to her orthostasis.  Will DC Losartan today.  Continue Coreg since she remains tachycardic.  She has persistent rales despite aggressive diuresis ( to the point of orthostasis).  This may suggest that there is a pulmonary component to her dyspnea and rales on physical exam.   We will not be able to diurese her any further than we did yesterday.    OK to go home when OK from a medicine standpoint.  She may need a pulmonary consult as OP (  inpatient if she does not improved quickly)  To see our NP Cecille Rubin ) in 1-2 weeks.  To see me in 1-2 months. Call [phone] for apt.    Disposition:  Length of Stay: 4  Thayer Headings, [name]., MD, [facility] 01/24/2012, 7:28 AM Office [phone] Pager [phone]

## 2012-01-24 NOTE — Consult Note (Signed)
Name: Tina Calderon MRN: [id number] DOB: 08-Nov-1940    LOS: 4  [facility] Note   History of Present Illness: 71 y/o M with PMH of Hypothyroidism, HTN, CHF, cervical fusion, recent bladder tack admitted on 8/12 with a 6 month hx of chest discomfort, intermittent dizziness and shortness of breath on exertion that worsened significantly one day prior to presentation -was unable to walk 150 feet without stopping.  6 months prior to admit she was walking 2 miles per day without difficulty.  She was evaluated by Cardiology and aggressively diuresed without decrease in crackles.  8/14 underwent cardiac cath which demonstrated mild coronary plaque with normal LV function.  Did have some orthostatic hypotension after diuresis and addition of anti-hypertensive's.  8/16 PCCM consulted for DOE, crackles / pulm eval.   Denies edema, weight changes, fever, cough.    Pulmonary History:   Lifelong non-smoker.  No hx of occupational exposures - worked for the city of [employer].  No pets.  Grew up in city of [place]. One sister deceased at age 37 with repeated "pneumonias" and brother with pulmonary fibrosis who was on the lung transplant list [facility] prior to passing.  Husband indicates she does snore occasionally but no noted periods of apnea.      Tests / Events: 8/12 CTA Chest>>>No evidence acute pulmonary embolism. Interlobular septal thickening suggest interstitial edema.  Coronary calcifications.  Large hiatal hernia.   8/13 ECHO>>> Left ventricle: The cavity size was normal. Systolic function was normal. The estimated ejection fraction was in the range of 55% to 60%. Wall motion was normal; there were no regional wall motion abnormalities.The main pulmonary artery was normal-sized. Systolic pressure was within the normal range.  8/13 Myoview>>>Reduced activity in the lateral wall and anteroseptal walls on stress and rest images could represent scar or motion artifact. No  inducible ischemia observed. The left ventricular ejection fraction is thought to be markedly overestimated based on counting statistics. We do not demonstrate evidence of interventricular septal thickening on  yesterday's chest CT.    Past Medical History  Diagnosis Date  . Thyroid disease   . Hypertension   . Prolapse of bladder   . Pelvic relaxation   . Hypothyroidism   . Anginal pain   . Shortness of breath   . Cancer   . CHF (congestive heart failure) 01/22/2012    Past Surgical History  Procedure Date  . Vaginal hysterectomy 15 years ago  . Bladder tact 15 years ago  . Tonsillectomy   . Neck surgery 2010  . Shoulder surgery   . Thyroid disease   . Anterior and posterior repair 12/05/2011    Procedure: ANTERIOR (CYSTOCELE) AND POSTERIOR REPAIR (RECTOCELE);  Surgeon: [name], MD;  Location: [facility];  Service: Gynecology;  Laterality: N/A;  with cysto    Prior to Admission medications   Medication Sig Start Date End Date Taking? Authorizing Provider  calcium-vitamin D (OSCAL WITH D) 500-200 MG-UNIT per tablet Take 1 tablet by mouth every morning.   Yes Historical Provider, MD  Cranberry 1000 MG CAPS Take 1 capsule by mouth daily.   Yes Historical Provider, MD  hydrochlorothiazide (HYDRODIURIL) 25 MG tablet Take 25 mg by mouth every morning.    Yes Historical Provider, MD  levothyroxine (SYNTHROID, LEVOTHROID) 100 MCG tablet Take 100 mcg by mouth every morning.    Yes Historical Provider, MD  loratadine (CLARITIN) 10 MG tablet Take 10 mg by mouth every evening.   Yes  Historical Provider, MD  Multiple Vitamin (MULTIVITAMIN WITH MINERALS) TABS Take 1 tablet by mouth every morning. Centrum silver   Yes Historical Provider, MD  promethazine (PHENERGAN) 12.5 MG tablet Take 1 tablet (12.5 mg total) by mouth every 6 (six) hours as needed for nausea. 12/06/11 12/13/11  Earnstine Regal, PA    Allergies Allergies  Allergen Reactions  . Dilaudid (Hydromorphone) Nausea And Vomiting     Family History Family History  Problem Relation Age of Onset  . Thyroid disease Mother   . Hypertension Mother   . Aneurysm Father   . Cancer Sister     LIVER  . Stroke Other   . Sleep apnea Other   . Coronary artery disease Mother     Social History  reports that she has never smoked. She has never used smokeless tobacco. She reports that she does not drink alcohol or use illicit drugs.  Review Of Systems:  Gen: Denies fever, chills, weight change, fatigue, night sweats HEENT: Denies blurred vision, double vision, hearing loss, tinnitus, sinus congestion, rhinorrhea, sore throat, neck stiffness, dysphagia PULM: Denies cough, sputum production, hemoptysis, wheezing CV: Denies chest pain, edema, orthopnea, paroxysmal nocturnal dyspnea, palpitations GI: Denies abdominal pain, nausea, vomiting, diarrhea, hematochezia, melena, constipation, change in bowel habits GU: Denies dysuria, hematuria, polyuria, oliguria, urethral discharge Endocrine: Denies hot or cold intolerance, polyuria, polyphagia or appetite change Derm: Denies rash, dry skin, scaling or peeling skin change Heme: Denies easy bruising, bleeding, bleeding gums Neuro: Denies headache, numbness, weakness, slurred speech, loss of memory or consciousness  Vital Signs: Temp:  [97.5 F (36.4 C)-98.1 F (36.7 C)] 97.6 F (36.4 C) (08/16 0800) Pulse Rate:  [64-98] 64  (08/16 0800) Resp:  [11-39] 18  (08/16 0800) BP: (100-135)/(53-78) 112/67 mmHg (08/16 0806) SpO2:  [92 %-100 %] 92 % (08/16 0800) Weight:  [140 lb 6.9 oz (63.7 kg)] 140 lb 6.9 oz (63.7 kg) (08/16 0014) I/O last 3 completed shifts: In: 2453.3 [P.O.:840; I.V.:1363.3; IV Piggyback:250] Out: 1900 [Urine:1900]  Physical Examination: General: Neuro: CV:  PULM: GI: Extremities:   Labs    CBC  Lab 01/20/12 1237  HGB 10.2*  HCT 30.9*  WBC 6.6  PLT 206   BMET  Lab 01/24/12 0538 01/23/12 0838 01/21/12 0515 01/20/12 1237  NA 140 140 137 139  K  3.7 3.4* -- --  CL 102 95* 97 103  CO2 _0 GLUCOSE 98 155* 95 102*  BUN _1 CREATININE 0.93 0.99 0.91 0.88  CALCIUM 9.3 9.9 9.2 9.0  MG -- -- -- --  PHOS -- -- -- --    Lab 01/20/12 1647  INR 1.14    No results found for this basename: PHART:5,PCO2:5,PCO2ART:5,PO2:5,PO2ART:5,HCO3:5,TCO2:5,O2SAT:5 in the last 168 hours   Radiology: 8/16 CXR>>>Stable bibasilar opacities representing interstitial thickening on comparison CT.  Some improvement in interstitial edema pattern seen on chest radiograph 01/20/2012.    Assessment and Plan: Principal Problem:  *Chest pain syndrome Active Problems:  Thyroid disease  H/O bladder repair surgery  Family history of cardiovascular disease  Anemia  Interstitial edema seen on chest CT 01/20/12  Respiratory failure with hypoxia  CHF (congestive heart failure)  Normocytic anemia  Hypertriglyceridemia  Acute on chronic diastolic CHF (congestive heart failure), NYHA class 3    Dyspnea / Chest Discomfort / Bilateral airspace disease  Assessment: 71 y/o WF with PMH of HTN on HCTZ with bilateral lower scarring on CT.  Subtle hand changes for ulnar drift (?RA)  and noted hx of joint issues with cervical fusion.  Two siblings with pulmonary issues -brother died while on transplant list at [facility] for pulmonary fibrosis.  No occupational exposures or other related hx.  Dyspnea despite diuresis with good response.  Plan: -high resolution CT scan -no contrast -check ESR, ANA, RF, ACE, CRP, C-ANCA, P-ANCA, SCL-70 -follow up in office with Dr. Lamonte Sakai -ambulatory desaturation test to determine if home O2 needed -hold on prednisone etc until eval complete    Noe Gens, NP-C [facility] Pgr: [phone]  01/24/2012, 10:22 AM   Autoimmune work up, HRCT and ambulatory desat ordered, f/u with Dr. Lamonte Sakai as office.  PCCM will see again on Monday  Patient seen and examined, agree with above note.  I dictated the  care and orders written for this patient under my direction.  Jennet Maduro, M.D. [phone]

## 2012-01-27 LAB — ANCA SCREEN W REFLEX TITER
c-ANCA Screen: NEGATIVE
p-ANCA Screen: NEGATIVE

## 2012-01-29 LAB — ANTI-SCLERODERMA ANTIBODY: Scleroderma (Scl-70) (ENA) Antibody, IgG: <1 AU/mL (ref <30)

## 2012-01-30 ENCOUNTER — Ambulatory Visit (INDEPENDENT_AMBULATORY_CARE_PROVIDER_SITE_OTHER): Payer: Medicare Other | Admitting: Nurse Practitioner

## 2012-01-30 ENCOUNTER — Encounter: Payer: Self-pay | Admitting: Nurse Practitioner

## 2012-01-30 ENCOUNTER — Ambulatory Visit (INDEPENDENT_AMBULATORY_CARE_PROVIDER_SITE_OTHER): Payer: Medicare Other | Admitting: Adult Health

## 2012-01-30 ENCOUNTER — Encounter: Payer: Self-pay | Admitting: Adult Health

## 2012-01-30 VITALS — BP 122/66 | HR 71 | Temp 98.2°F | Ht 62.5 in | Wt 139.8 lb

## 2012-01-30 VITALS — BP 142/64 | HR 97 | Ht 62.5 in | Wt 140.0 lb

## 2012-01-30 DIAGNOSIS — J849 Interstitial pulmonary disease, unspecified: Secondary | ICD-10-CM

## 2012-01-30 DIAGNOSIS — J8410 Pulmonary fibrosis, unspecified: Secondary | ICD-10-CM

## 2012-01-30 DIAGNOSIS — R0600 Dyspnea, unspecified: Secondary | ICD-10-CM

## 2012-01-30 DIAGNOSIS — R0609 Other forms of dyspnea: Secondary | ICD-10-CM

## 2012-01-30 NOTE — Patient Instructions (Addendum)
We are going to try and get your pulmonary appointment moved up with Dr. Roxana Hires his NP - this is for today at 44 AM  Stay on your current medicines  We will see you back as needed.  Call the [facility] office at [phone] if you have any questions, problems or concerns.

## 2012-01-30 NOTE — Progress Notes (Signed)
Tina Calderon Date of Birth: 1941-05-18 Medical Record #[id number]  History of Present Illness: Tina Calderon is seen today for a post hospital visit. She is seen for Dr. Acie Fredrickson. She was recently admitted with shortness of breath and chest pain. She has had extensive cardiac workup which is basically benign. Her EF is normal. No real CAD per cath. She has had an abnormal high resolution CT of the chest with results as noted below. She has had several siblings die with lung issues.   She comes in today. She is here alone. She remains quite short of breath and weak. No real cough. Weight is stable. She is not on any different medicines. She is asking about her CT results.   Current Outpatient Prescriptions on File Prior to Visit  Medication Sig Dispense Refill  . calcium-vitamin D (OSCAL WITH D) 500-200 MG-UNIT per tablet Take 1 tablet by mouth every morning.      . Cranberry 1000 MG CAPS Take 1 capsule by mouth daily.      . hydrochlorothiazide (HYDRODIURIL) 25 MG tablet Take 1 tablet (25 mg total) by mouth every morning.      [name] levothyroxine (SYNTHROID, LEVOTHROID) 100 MCG tablet Take 100 mcg by mouth every morning.       . loratadine (CLARITIN) 10 MG tablet Take 10 mg by mouth every evening.      . Multiple Vitamin (MULTIVITAMIN WITH MINERALS) TABS Take 1 tablet by mouth every morning. Centrum silver      . promethazine (PHENERGAN) 12.5 MG tablet Take 1 tablet (12.5 mg total) by mouth every 6 (six) hours as needed for nausea.  12 tablet  0    Allergies  Allergen Reactions  . Dilaudid (Hydromorphone) Nausea And Vomiting    Past Medical History  Diagnosis Date  . Thyroid disease   . Hypertension   . Prolapse of bladder   . Pelvic relaxation   . Hypothyroidism   . Anginal pain   . Shortness of breath   . Cancer   . CHF (congestive heart failure) 01/22/2012    Past Surgical History  Procedure Date  . Vaginal hysterectomy 15 years ago  . Bladder tact 15 years ago  . Tonsillectomy   .  Neck surgery 2010  . Shoulder surgery   . Thyroid disease   . Anterior and posterior repair 12/05/2011    Procedure: ANTERIOR (CYSTOCELE) AND POSTERIOR REPAIR (RECTOCELE);  Surgeon: [name], MD;  Location: [facility];  Service: Gynecology;  Laterality: N/A;  with cysto    History  Smoking status  . Never Smoker   Smokeless tobacco  . Never Used    History  Alcohol Use No    Family History  Problem Relation Age of Onset  . Thyroid disease Mother   . Hypertension Mother   . Coronary artery disease Mother   . Aneurysm Father   . Cancer Sister     LIVER  . Stroke Other   . Sleep apnea Other     Review of Systems: The review of systems is per the HPI.  All other systems were reviewed and are negative.  Physical Exam: BP 142/64  Pulse 97  Ht 5' 2.5" (1.588 m)  Wt 140 lb (63.504 kg)  BMI 25.20 kg/m2  SpO2 99% Patient is very pleasant and in no acute distress. She is short of breath with walking here in the office. Her oxygen sats were 99% at rest and with ambulation only dropped to 93%. She was  visibly dyspneic. Skin is warm and dry. Color is normal.  HEENT is unremarkable. Normocephalic/atraumatic. PERRL. Sclera are nonicteric. Neck is supple. No masses. No JVD. Lungs are clear. Cardiac exam shows a regular rate and rhythm. Abdomen is soft. Extremities are without edema. Gait and ROM are intact. No gross neurologic deficits noted.   LABORATORY DATA:   NUCLEAR STRESS TEST  LEFT VENTRICULAR EJECTION FRACTION  Findings: Left ventricular end-diastolic volume is 30 ml cc. End- systolic volume is markedly under estimated at 1 ml cc. Derived LV ejection fraction is 97%, markedly overestimated.  GATED LEFT VENTRICULAR WALL MOTION STUDY  Findings: Low ventricular wall motion wall thickening appear within normal limits, although degree of contraction is likely Overestimated.   Coronary angiography:   Coronary dominance: Right  Left mainstem: Long and normal  Left  anterior descending (LAD): Mild ostial and proximal calcification. Proximal 30% focal stenosis. D1 moderate to small sized with ostial 40%. D2 small and normal.  Left circumflex (LCx): AV groove normal. OM 1 tiny. OM2 small and normal. OM3 large and normal.  Right coronary artery (RCA): Large. Normal. PDA large and normal.  Left ventriculography: Left ventricular systolic function is normal, LVEF is estimated at 65%, there is no significant mitral regurgitation   Final Conclusions: Mild coronary plaque. Normal LV function.   Recommendations: No further cardiac work up. She will continue to be managed for CHF with a preserved EF.   [name]  01/22/2012, 3:57 PM  IMPRESSION:  1. Reduced activity in the lateral wall and anteroseptal walls on stress and rest images could represent scar or motion artifact. No inducible ischemia observed. 2. The left ventricular ejection fraction is thought to be markedly overestimated based on counting statistics. We do not demonstrate evidence of interventricular septal thickening on yesterday's chest CT.  Original Report Authenticated By: Carron Curie, M.D.  Echo Study Conclusions  Left ventricle: The cavity size was normal. Systolic function was normal. The estimated ejection fraction was in the range of 55% to 60%. Wall motion was normal; there were no regional wall motion abnormalities.  Lab Results  Component Value Date   WBC 6.6 01/20/2012   HGB 10.2* 01/20/2012   HCT 30.9* 01/20/2012   PLT 206 01/20/2012   GLUCOSE 98 01/24/2012   CHOL 192 01/21/2012   TRIG 239* 01/21/2012   HDL 46 01/21/2012   LDLCALC 98 01/21/2012   ALT 18 09/23/2009   AST 23 09/23/2009   NA 140 01/24/2012   K 3.7 01/24/2012   CL 102 01/24/2012   CREATININE 0.93 01/24/2012   BUN 23 01/24/2012   CO2 29 01/24/2012   TSH 1.945 01/20/2012   INR 1.14 01/20/2012   HGBA1C 6.1* 01/20/2012   CT CHEST IMPRESSION:  1. The appearance of the lungs does suggest an  underlying interstitial lung disease. 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 Alternatively, this could represent fibrotic phase nonspecific interstitial pneumonia (NSIP). 2. There is also evidence of air trapping in the lung bases bilaterally, suggesting some small airways disease. 3. Moderate - large sized hiatal hernia. 4. Mild cardiomegaly. 5. Atherosclerosis, including left main and two-vessel coronary artery disease.  Original Report Authenticated By: Etheleen Mayhew, M.D.   Assessment / Plan:  Dyspnea - This looks to  be more pulmonary in etiology based on the CT scan. I have shared these results with the patient since she had not been informed yet. We will try to get her appointment moved up with pulmonary. Her cardiac status is felt to be stable from our standpoint. We will see her back prn.   Patient is agreeable to this plan and will call if any problems develop in the interim.

## 2012-01-30 NOTE — Patient Instructions (Addendum)
Continue on current regimen.  Follow up Dr. Lamonte Sakai  In 2 weeks with PFTs

## 2012-01-30 NOTE — Progress Notes (Signed)
  Subjective:    Patient ID: Tina Calderon, female    DOB: 07/22/1940, 71 y.o.   MRN: [id number]  HPI 71 yo female never smoker seen for initial pulmonary consult during hospital for acute respiratory failure   01/30/2012 [facility] follow up  Pt was admitted 2 weeks ago for chest pain and dyspnea.  Found to have Acute respiratory failure with hypoxia--  Tx for possible diastolic CHF with aggressive diuresis Echo showed EF 60% , no mention of diastolic dysfxn.  HRCT consistent with ILD-  CCM consult with autoimmune workup that was unrevealing with neg ANA, ANCA, RA factor , nml ESR , CRP     Does light housework but not able walk long distances due to dyspnea  FH of pulmonary fibrosis -sibling -they were smokers. NO significant desats with walking in cardiology yesterday, desated to 93% on room air.  No cough or wheezing  No edema.   Since discharge she is feeling better with less DOE. But does get winded with walking long distances.     SH:  Retired Teaching laboratory technician work.  Never smoker.  2 kids-grown.  Married.  From [place].  No etoh/drugs.    Review of Systems Constitutional:   No  weight loss, night sweats,  Fevers, chills, + fatigue, or  lassitude.  HEENT:   No headaches,  Difficulty swallowing,  Tooth/dental problems, or  Sore throat,                No sneezing, itching, ear ache, nasal congestion, post nasal drip,   CV:  No chest pain,  Orthopnea, PND, swelling in lower extremities, anasarca, dizziness, palpitations, syncope.   GI  No heartburn, indigestion, abdominal pain, nausea, vomiting, diarrhea, change in bowel habits, loss of appetite, bloody stools.   Resp:  No excess mucus, no productive cough,  No non-productive cough,  No coughing up of blood.  No change in color of mucus.  No wheezing.  No chest wall deformity  Skin: no rash or lesions.  GU: no dysuria, change in color of urine, no urgency or frequency.  No flank pain, no hematuria   MS:  No joint  pain or swelling.  No decreased range of motion.  No back pain.  Psych:  No change in mood or affect. No depression or anxiety.  No memory loss.         Objective:   Physical Exam GEN: A/Ox3; pleasant , NAD, well nourished   HEENT:  [place]/AT,  EACs-clear, TMs-wnl, NOSE-clear, THROAT-clear, no lesions, no postnasal drip or exudate noted.   NECK:  Supple w/ fair ROM; no JVD; normal carotid impulses w/o bruits; no thyromegaly or nodules palpated; no lymphadenopathy.  RESP  Coarse BS no crackles noted .no accessory muscle use, no dullness to percussion  CARD:  RRR, no m/r/g  , no peripheral edema, pulses intact, no cyanosis or clubbing.  GI:   Soft & nt; nml bowel sounds; no organomegaly or masses detected.  Musco: Warm bil, no deformities or joint swelling noted.   Neuro: alert, no focal deficits noted.    Skin: Warm, no lesions or rashes         Assessment & Plan:

## 2012-02-04 ENCOUNTER — Encounter: Payer: Self-pay | Admitting: Adult Health

## 2012-02-04 DIAGNOSIS — J84112 Idiopathic pulmonary fibrosis: Secondary | ICD-10-CM | POA: Insufficient documentation

## 2012-02-04 DIAGNOSIS — J849 Interstitial pulmonary disease, unspecified: Secondary | ICD-10-CM

## 2012-02-04 HISTORY — DX: Interstitial pulmonary disease, unspecified: J84.9

## 2012-02-04 NOTE — Assessment & Plan Note (Signed)
New onset hypoxia requiring admission  CT chest showed possible ILD  Autoimmune workup has been unrevealing  Does have a reported FH with 2 sibling dx with PF (smoker)  Clinically she is improved with no desaturations with ambulation  Will set up to return for PFT to get baseline fxn.

## 2012-02-11 ENCOUNTER — Ambulatory Visit (INDEPENDENT_AMBULATORY_CARE_PROVIDER_SITE_OTHER): Payer: Medicare Other | Admitting: Emergency Medicine

## 2012-02-11 DIAGNOSIS — J849 Interstitial pulmonary disease, unspecified: Secondary | ICD-10-CM

## 2012-02-11 DIAGNOSIS — J8410 Pulmonary fibrosis, unspecified: Secondary | ICD-10-CM

## 2012-02-11 NOTE — Progress Notes (Signed)
PFT done today. 

## 2012-02-13 ENCOUNTER — Encounter: Payer: Self-pay | Admitting: Emergency Medicine

## 2012-02-18 ENCOUNTER — Inpatient Hospital Stay: Payer: Medicare Other | Admitting: Emergency Medicine

## 2012-02-21 ENCOUNTER — Encounter: Payer: Self-pay | Admitting: Emergency Medicine

## 2012-02-21 ENCOUNTER — Ambulatory Visit (INDEPENDENT_AMBULATORY_CARE_PROVIDER_SITE_OTHER): Payer: Medicare Other | Admitting: Emergency Medicine

## 2012-02-21 VITALS — BP 118/72 | HR 103 | Temp 98.3°F | Ht 62.5 in | Wt 142.6 lb

## 2012-02-21 DIAGNOSIS — J849 Interstitial pulmonary disease, unspecified: Secondary | ICD-10-CM

## 2012-02-21 DIAGNOSIS — J8410 Pulmonary fibrosis, unspecified: Secondary | ICD-10-CM

## 2012-02-21 MED ORDER — PREDNISONE 20 MG PO TABS: ORAL_TABLET | ORAL | Status: DC

## 2012-02-21 NOTE — Progress Notes (Signed)
  Subjective:    Patient ID: Tina Calderon, female    DOB: 1941/04/29, 71 y.o.   MRN: [id number]  HPI 71 yo female never smoker seen for initial pulmonary consult during hospital for acute respiratory failure   [facility] follow up 01/30/12 -  Pt was admitted 2 weeks ago for chest pain and dyspnea.  Found to have Acute respiratory failure with hypoxia--  Tx for possible diastolic CHF with aggressive diuresis Echo showed EF 60% , no mention of diastolic dysfxn.  HRCT consistent with ILD-  CCM consult with autoimmune workup that was unrevealing with neg ANA, ANCA, RA factor , nml ESR , CRP     Does light housework but not able walk long distances due to dyspnea  FH of pulmonary fibrosis -sibling -they were smokers. NO significant desats with walking in cardiology yesterday, desated to 93% on room air.  No cough or wheezing  No edema.   Since discharge she is feeling better with less DOE. But does get winded with walking long distances.   ROV 02/21/12 -- follows up for ILD noted during hospitalization for resp failure. Treated for possible diastolic CHF. CT scan shows some mild basilar fibrotic change. PFT's 9/3 >> probable mixed disease, no BD response, restricted volumes, decrease DLCO that corrects for Va.  She had family hx of ILD.  She had walking oximetry that she says was reassuring. She feels no better today than at hospital discharge.       Objective:   Physical Exam Filed Vitals:   02/21/12 1459  BP: 118/72  Pulse: 103  Temp: 98.3 F (36.8 C)    GEN: A/Ox3; pleasant , NAD, well nourished   HEENT:  [place]/AT,  EACs-clear, TMs-wnl, NOSE-clear, THROAT-clear, no lesions, no postnasal drip or exudate noted.   NECK:  Supple w/ fair ROM; no JVD; normal carotid impulses w/o bruits; no thyromegaly or nodules palpated; no lymphadenopathy.  RESP  Bibasilar insp crackles  CARD:  RRR, no m/r/g  , no peripheral edema, pulses intact, no cyanosis or clubbing.  Musco: Warm bil, no  deformities or joint swelling noted.   Neuro: alert, no focal deficits noted.    Skin: Warm, no lesions or rashes      Assessment & Plan:  ILD (interstitial lung disease) Etiology unclear. Autoimmune w/u negative.  - would not favor bx at this point - therapeutic trial pred 17m qd x 1 month and then f/u  - walking oximetry today

## 2012-02-21 NOTE — Patient Instructions (Addendum)
Walking oximetry today Start prednisone 40m daily until next visit Follow with Dr BLamonte Sakaiin 1 month.

## 2012-02-21 NOTE — Assessment & Plan Note (Signed)
Etiology unclear. Autoimmune w/u negative.  - would not favor bx at this point - therapeutic trial pred 45m qd x 1 month and then f/u  - walking oximetry today

## 2012-03-24 ENCOUNTER — Ambulatory Visit (INDEPENDENT_AMBULATORY_CARE_PROVIDER_SITE_OTHER): Payer: Medicare Other | Admitting: Emergency Medicine

## 2012-03-24 ENCOUNTER — Encounter: Payer: Self-pay | Admitting: Emergency Medicine

## 2012-03-24 ENCOUNTER — Ambulatory Visit (INDEPENDENT_AMBULATORY_CARE_PROVIDER_SITE_OTHER)
Admission: RE | Admit: 2012-03-24 | Discharge: 2012-03-24 | Disposition: A | Discharge status: Home / Self Care | Payer: Medicare Other | Source: Ambulatory Visit | Attending: Emergency Medicine | Admitting: Emergency Medicine

## 2012-03-24 VITALS — BP 150/78 | HR 107 | Temp 98.4°F | Ht 62.5 in | Wt 145.6 lb

## 2012-03-24 DIAGNOSIS — J8410 Pulmonary fibrosis, unspecified: Secondary | ICD-10-CM

## 2012-03-24 DIAGNOSIS — J849 Interstitial pulmonary disease, unspecified: Secondary | ICD-10-CM

## 2012-03-24 NOTE — Progress Notes (Signed)
  Subjective:    Patient ID: Tina Calderon, female    DOB: 04-Jan-1941, 71 y.o.   MRN: [id number]  HPI 71 yo female never smoker seen for initial pulmonary consult during hospital for acute respiratory failure   [facility] follow up 01/30/12 -  Pt was admitted 2 weeks ago for chest pain and dyspnea.  Found to have Acute respiratory failure with hypoxia--  Tx for possible diastolic CHF with aggressive diuresis Echo showed EF 60% , no mention of diastolic dysfxn.  HRCT consistent with ILD-  CCM consult with autoimmune workup that was unrevealing with neg ANA, ANCA, RA factor , nml ESR , CRP     Does light housework but not able walk long distances due to dyspnea  FH of pulmonary fibrosis -sibling -they were smokers. NO significant desats with walking in cardiology yesterday, desated to 93% on room air.  No cough or wheezing  No edema.   Since discharge she is feeling better with less DOE. But does get winded with walking long distances.   ROV 02/21/12 -- follows up for ILD noted during hospitalization for resp failure. Treated for possible diastolic CHF. CT scan shows some mild basilar fibrotic change. PFT's 9/3 >> probable mixed disease, no BD response, restricted volumes, decrease DLCO that corrects for Va.  She had family hx of ILD.  She had walking oximetry that she says was reassuring. She feels no better today than at hospital discharge.   ROV 03/24/12 -- ILD without clear etiology, mixed dz on PFT 02/11/12. Initiated a trial prednisone last month >> She doesn't believe that her dyspnea has changed.  Last time she did not desat.       Objective:   Physical Exam Filed Vitals:   03/24/12 1330  BP: 150/78  Pulse: 107  Temp: 98.4 F (36.9 C)    GEN: A/Ox3; pleasant , NAD, well nourished   HEENT:  [place]/AT,  EACs-clear, TMs-wnl, NOSE-clear, THROAT-clear, no lesions, no postnasal drip or exudate noted.   NECK:  Supple w/ fair ROM; no JVD; normal carotid impulses w/o bruits; no  thyromegaly or nodules palpated; no lymphadenopathy.  RESP  Bibasilar insp crackles, R > L  CARD:  RRR, no m/r/g  , no peripheral edema, pulses intact, no cyanosis or clubbing.  Musco: Warm bil, no deformities or joint swelling noted.   Neuro: alert, no focal deficits noted.    Skin: Warm, no lesions or rashes      Assessment & Plan:  ILD (interstitial lung disease) - will wean pred to off - will refer to pulm rehab at [facility] - repeat CXR in 3 months at [facility]

## 2012-03-24 NOTE — Patient Instructions (Addendum)
Diagnosis: Interstitial Lung Disease or Idiopathic Pulmonary Fibrosis  CXR today Please decrease your prednisone to 63m daily for 4 days, then 286mdaily for 4 days, then 1042maily for 4 days, then stop.  We will set up pulmonary rehab at [facility] may be other medication options for us [place] try in the future. We can discuss at our follow up visits Follow with Dr ByrLamonte Sakai 3 months with a CXR or sooner if you have any problems.

## 2012-03-24 NOTE — Assessment & Plan Note (Signed)
-  will wean pred to off - will refer to pulm rehab at [facility] - repeat CXR in 3 months at [facility]

## 2012-03-31 ENCOUNTER — Encounter: Payer: Medicare Other | Admitting: Cardiovascular Disease

## 2012-06-23 ENCOUNTER — Ambulatory Visit (INDEPENDENT_AMBULATORY_CARE_PROVIDER_SITE_OTHER): Payer: Medicare Other | Admitting: Emergency Medicine

## 2012-06-23 ENCOUNTER — Ambulatory Visit (INDEPENDENT_AMBULATORY_CARE_PROVIDER_SITE_OTHER)
Admission: RE | Admit: 2012-06-23 | Discharge: 2012-06-23 | Disposition: A | Discharge status: Home / Self Care | Payer: Medicare Other | Source: Ambulatory Visit | Attending: Emergency Medicine | Admitting: Emergency Medicine

## 2012-06-23 ENCOUNTER — Encounter: Payer: Self-pay | Admitting: Emergency Medicine

## 2012-06-23 VITALS — BP 120/72 | HR 112 | Temp 97.6°F | Ht 62.5 in | Wt 146.2 lb

## 2012-06-23 DIAGNOSIS — J8410 Pulmonary fibrosis, unspecified: Secondary | ICD-10-CM

## 2012-06-23 DIAGNOSIS — J849 Interstitial pulmonary disease, unspecified: Secondary | ICD-10-CM

## 2012-06-23 NOTE — Patient Instructions (Addendum)
Please continue to work with Pulmonary Rehab at [facility].  Your CXR today is stable.  Follow with Dr Lamonte Sakai in 6 months or sooner if you have any problems

## 2012-06-23 NOTE — Assessment & Plan Note (Signed)
Stable by CXR and clinically - continue pulm rehab - follow 6 months

## 2012-06-23 NOTE — Progress Notes (Signed)
  Subjective:    Patient ID: Tina Calderon, female    DOB: [birth year], 72 y.o.   MRN: [id number]  HPI 72 yo female never smoker seen for initial pulmonary consult during hospital for acute respiratory failure   [facility] follow up 01/30/12 -  Pt was admitted 2 weeks ago for chest pain and dyspnea.  Found to have Acute respiratory failure with hypoxia--  Tx for possible diastolic CHF with aggressive diuresis Echo showed EF 60% , no mention of diastolic dysfxn.  HRCT consistent with ILD-  CCM consult with autoimmune workup that was unrevealing with neg ANA, ANCA, RA factor , nml ESR , CRP     Does light housework but not able walk long distances due to dyspnea  FH of pulmonary fibrosis -sibling -they were smokers. NO significant desats with walking in cardiology yesterday, desated to 93% on room air.  No cough or wheezing  No edema.   Since discharge she is feeling better with less DOE. But does get winded with walking long distances.   ROV 02/21/12 -- follows up for ILD noted during hospitalization for resp failure. Treated for possible diastolic CHF. CT scan shows some mild basilar fibrotic change. PFT's 9/3 >> probable mixed disease, no BD response, restricted volumes, decrease DLCO that corrects for Va.  She had family hx of ILD.  She had walking oximetry that she says was reassuring. She feels no better today than at hospital discharge.   ROV 03/24/12 -- ILD without clear etiology, mixed dz on PFT 02/11/12. Initiated a trial prednisone last month >> She doesn't believe that her dyspnea has changed.  Last time she did not desat.   ROV 06/23/12 -- ILD without clear etiology, mixed dz on PFT 02/11/12. Trial of pred >> weaned to off. Sent her to pulm rehab at [place], feels that it has benefited her. She isn't as SOB as before. CXR today >> stable bibasilar ILD, hiatal hernia.       Objective:   Physical Exam Filed Vitals:   06/23/12 1524  BP: 120/72  Pulse: 112  Temp: 97.6 F (36.4  C)    GEN: A/Ox3; pleasant , NAD, well nourished   HEENT:  [place]/AT,  EACs-clear, TMs-wnl, NOSE-clear, THROAT-clear, no lesions, no postnasal drip or exudate noted.   NECK:  Supple w/ fair ROM; no JVD; normal carotid impulses w/o bruits; no thyromegaly or nodules palpated; no lymphadenopathy.  RESP  Bibasilar insp crackles, R > L  CARD:  RRR, no m/r/g  , no peripheral edema, pulses intact, no cyanosis or clubbing.  Musco: Warm bil, no deformities or joint swelling noted.   Neuro: alert, no focal deficits noted.    Skin: Warm, no lesions or rashes      Assessment & Plan:  ILD (interstitial lung disease) Stable by CXR and clinically - continue pulm rehab - follow 6 months

## 2012-12-04 ENCOUNTER — Ambulatory Visit (INDEPENDENT_AMBULATORY_CARE_PROVIDER_SITE_OTHER): Payer: Medicare Other | Admitting: Emergency Medicine

## 2012-12-04 ENCOUNTER — Encounter: Payer: Self-pay | Admitting: Emergency Medicine

## 2012-12-04 VITALS — BP 128/78 | HR 94 | Temp 98.1°F | Ht 63.0 in | Wt 148.0 lb

## 2012-12-04 DIAGNOSIS — J8410 Pulmonary fibrosis, unspecified: Secondary | ICD-10-CM

## 2012-12-04 DIAGNOSIS — J849 Interstitial pulmonary disease, unspecified: Secondary | ICD-10-CM

## 2012-12-04 NOTE — Assessment & Plan Note (Signed)
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 She walked to exhaustion today without desaturation on RA.  Interested in the pirfenidone trial as well as possible BD for her obstructive component (based on her spirometry) - will repeat PFT  - will set her up with Dr [name] to discuss pirfenidone - trial albuterol to see if she benefits - rov 3 months

## 2012-12-04 NOTE — Patient Instructions (Addendum)
Walking oximetry today We will repeat your pulmonary function testing We will refer you to see Dr [name] to discuss the open label pirfenidone trial We will start albuterol 2 puffs to be used up to every 6 hours if needed for shortness of breath to see if you benefit.  Follow with Dr Lamonte Sakai in 3 months or sooner if you have any problems.

## 2012-12-04 NOTE — Progress Notes (Signed)
Subjective:    Patient ID: Tina Calderon, female    DOB: 1941-06-09, 72 y.o.   MRN: [id number]  HPI 72 yo female never smoker seen for initial pulmonary consult during hospital for acute respiratory failure   [facility] follow up 01/30/12 -  Pt was admitted 2 weeks ago for chest pain and dyspnea.  Found to have Acute respiratory failure with hypoxia--  Tx for possible diastolic CHF with aggressive diuresis Echo showed EF 60% , no mention of diastolic dysfxn.  HRCT consistent with ILD-  CCM consult with autoimmune workup that was unrevealing with neg ANA, ANCA, RA factor , nml ESR , CRP     Does light housework but not able walk long distances due to dyspnea  FH of pulmonary fibrosis -sibling -they were smokers. NO significant desats with walking in cardiology yesterday, desated to 93% on room air.  No cough or wheezing  No edema.   Since discharge she is feeling better with less DOE. But does get winded with walking long distances.   ROV 02/21/12 -- follows up for ILD noted during hospitalization for resp failure. Treated for possible diastolic CHF. CT scan shows some mild basilar fibrotic change. PFT's 9/3 >> probable mixed disease, no BD response, restricted volumes, decrease DLCO that corrects for Va.  She had family hx of ILD.  She had walking oximetry that she says was reassuring. She feels no better today than at hospital discharge.   ROV 03/24/12 -- ILD without clear etiology, mixed dz on PFT 02/11/12. Initiated a trial prednisone last month >> She doesn't believe that her dyspnea has changed.  Last time she did not desat.   ROV 06/23/12 -- ILD without clear etiology, mixed dz on PFT 02/11/12. Trial of pred >> weaned to off. Sent her to pulm rehab at [place], feels that it has benefited her. She isn't as SOB as before. CXR today >> stable bibasilar ILD, hiatal hernia.   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 Also possible component diastolic dysfxn. She has a family hx of ILD. She tells me that her dyspnea is worse compared with last year. She is having some spells of near syncope on exertion. Walked today to exhaustion without desaturation.       Objective:   Physical Exam Filed Vitals:   12/04/12 1622  BP: 128/78  Pulse: 94  Temp: 98.1 F (36.7 C)    GEN: A/Ox3; pleasant , NAD, well nourished   HEENT:  [place]/AT,  EACs-clear, TMs-wnl, NOSE-clear, THROAT-clear, no lesions, no postnasal drip or exudate noted.   NECK:  Supple w/ fair ROM; no JVD; normal carotid impulses w/o bruits; no thyromegaly or nodules palpated; no lymphadenopathy.  RESP  Bibasilar insp crackles, R > L  CARD:  RRR, no m/r/g  , no peripheral edema, pulses intact, no cyanosis or clubbing.  Musco: Warm bil, no deformities or joint swelling noted.   Neuro: alert, no focal deficits noted.    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 She walked to exhaustion today without desaturation on RA.  Interested in the pirfenidone trial as well as possible BD for her obstructive component (based on her spirometry) - will repeat PFT  - will set her up with Dr [name] to discuss pirfenidone - trial albuterol to see if  she benefits - rov 3 months

## 2012-12-28 ENCOUNTER — Telehealth: Payer: Self-pay | Admitting: Internal Medicine

## 2012-12-28 NOTE — Telephone Encounter (Signed)
Okay to schedule pt at 2:00 pm Friday 01/01/13 per Alyse Low.

## 2013-01-01 ENCOUNTER — Ambulatory Visit (INDEPENDENT_AMBULATORY_CARE_PROVIDER_SITE_OTHER): Payer: Medicare Other | Admitting: Internal Medicine

## 2013-01-01 ENCOUNTER — Other Ambulatory Visit: Payer: Medicare Other

## 2013-01-01 ENCOUNTER — Encounter: Payer: Self-pay | Admitting: Internal Medicine

## 2013-01-01 VITALS — BP 120/70 | HR 80 | Temp 98.2°F | Ht 62.0 in | Wt 150.0 lb

## 2013-01-01 DIAGNOSIS — J84112 Idiopathic pulmonary fibrosis: Secondary | ICD-10-CM

## 2013-01-01 DIAGNOSIS — J849 Interstitial pulmonary disease, unspecified: Secondary | ICD-10-CM

## 2013-01-01 DIAGNOSIS — J8410 Pulmonary fibrosis, unspecified: Secondary | ICD-10-CM

## 2013-01-01 LAB — PULMONARY FUNCTION TEST

## 2013-01-01 NOTE — Progress Notes (Signed)
PFT done today. 

## 2013-01-01 NOTE — Progress Notes (Signed)
Subjective:    Patient ID: Tina Calderon, female    DOB: 1940-08-28, 72 y.o.   MRN: [id number]  HPI 72 yo female never smoker seen for initial pulmonary consult during hospital for acute respiratory failure   [facility] follow up 01/30/12 -  Pt was admitted 2 weeks ago for chest pain and dyspnea.  Found to have Acute respiratory failure with hypoxia--  Tx for possible diastolic CHF with aggressive diuresis Echo showed EF 60% , no mention of diastolic dysfxn.  HRCT consistent with ILD-  CCM consult with autoimmune workup that was unrevealing with neg ANA, ANCA, RA factor , nml ESR , CRP     Does light housework but not able walk long distances due to dyspnea  FH of pulmonary fibrosis -sibling -they were smokers. NO significant desats with walking in cardiology yesterday, desated to 93% on room air.  No cough or wheezing  No edema.   Since discharge she is feeling better with less DOE. But does get winded with walking long distances.   ROV 02/21/12 -- follows up for ILD noted during hospitalization for resp failure. Treated for possible diastolic CHF. CT scan shows some mild basilar fibrotic change. PFT's 9/3 >> probable mixed disease, no BD response, restricted volumes, decrease DLCO that corrects for Va.  She had family hx of ILD.  She had walking oximetry that she says was reassuring. She feels no better today than at hospital discharge.   ROV 03/24/12 -- ILD without clear etiology, mixed dz on PFT 02/11/12. Initiated a trial prednisone last month >> She doesn't believe that her dyspnea has changed.  Last time she did not desat.   ROV 06/23/12 -- ILD without clear etiology, mixed dz on PFT 02/11/12. Trial of pred >> weaned to off. Sent her to pulm rehab at [place], feels that it has benefited her. She isn't as SOB as before. CXR today >> stable bibasilar ILD, hiatal hernia.   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 Also possible component diastolic dysfxn. She has a family hx of ILD. She tells me that her dyspnea is worse compared with last year. She is having some spells of near syncope on exertion. Walked today to exhaustion without desaturation.    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 Any exposure to asbestos, radiation, chemotherapy, medication such as amiodarone, nitrafurantoin, bleomycin, BCG vaccination - NO per patient   2.-  Any occupational exposure to asbestos, metal grinding, lathe work, Medical illustrator , titanium, zirconium, - NO per patienbt  3. Home exposure to mold? - NO per patient  4. No hx of smoking  5. 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   6. EKG Aug 2013: QTc 477 msec. Denies cardiac hx   Fam hx:   -  brother died from "IPF" at age 39 while waiting for lung transpant. Died in 2004-08-15  - sister died forom "IPF" at age 65. Died in 1988/08/15     Objective:    Filed Vitals:   01/01/13 1539  BP: 120/70  Pulse: 80  Temp: 98.2 F (36.8 C)  TempSrc: Oral  Height: _0  (1.575 m)  Weight: 150 lb (68.04 kg)  SpO2: 97%     Review of Systems  Constitutional: Negative for fever and unexpected weight change.  HENT: Negative for ear pain, nosebleeds, congestion, sore throat, rhinorrhea, sneezing, trouble swallowing, dental problem, postnasal drip  and sinus pressure.   Eyes: Negative for redness and itching.  Respiratory: Negative for cough, chest tightness, shortness of breath and wheezing.   Cardiovascular: Negative for palpitations and leg swelling.  Gastrointestinal: Negative for nausea and vomiting.  Genitourinary: Negative for dysuria.  Musculoskeletal: Negative for joint swelling.  Skin: Negative for rash.  Neurological: Negative for headaches.  Hematological: Does not bruise/bleed easily.  Psychiatric/Behavioral: Negative for dysphoric mood. The patient is not nervous/anxious.        Objective:   Physical Exam  Vitals reviewed. Constitutional: She is oriented to person, place, and time. She appears well-developed and well-nourished. No distress.  Body mass index is 27.43 kg/(m^2).   HENT:  Head: Normocephalic and atraumatic.  Right Ear: External ear normal.  Left Ear: External ear normal.  Mouth/Throat: Oropharynx is clear and moist. No oropharyngeal exudate.  Eyes: Conjunctivae and EOM are normal. Pupils are equal, round, and reactive to light. Right eye exhibits no discharge. Left eye exhibits no discharge. No scleral icterus.  Neck: Normal range of motion. Neck supple. No JVD present. No tracheal deviation present. No thyromegaly present.  Cardiovascular: Normal rate, regular rhythm, normal heart sounds and intact distal pulses.  Exam reveals no gallop and no friction rub.   No murmur heard. Pulmonary/Chest: Effort normal. No respiratory distress. She has no wheezes. She has rales. She exhibits no tenderness.  Abdominal: Soft. Bowel sounds are normal. She exhibits no distension and no mass. There is no tenderness. There is no rebound and no guarding.  Musculoskeletal: Normal range of motion. She exhibits no edema and no tenderness.  Lymphadenopathy:    She has no cervical adenopathy.  Neurological: She is alert and oriented to person, place, and time. She has normal reflexes. No cranial nerve deficit. She exhibits  normal muscle tone. Coordination normal.  Skin: Skin is warm and dry. No rash noted. She is not diaphoretic. No erythema. No pallor.  Psychiatric: She has a normal mood and affect. Her behavior is normal. Judgment and thought content normal.

## 2013-01-01 NOTE — Patient Instructions (Addendum)
#  ILD  - plesae do blood work for remaining autoimmune antibodies; will cal you with results  - if these are negative or only trace positive you will qualify for the Pirfenidone EAP program   - start OTC omeprazole 26m on empty stomach daily  - please register with [email or web address] read the consent form for EAP trial  - read about pirfenidone below - will call you to advise next step   #Pirefenidone  As of 01/01/2013  Pirfenidone is [place] an [place]  BASICS  - Believed to function as a general inhibitor of fibrosis and inflammation Exact mechanism of action unknown - Functions by preventing worsening of IPF. Does not reverse process - Studied only in IPF (not studied in other causes of UIP like autoimmune or asbestosis) - Studied only in mild to moderate severity of IPF (DLCO > 30% and FVC > 50%) - Marketed by company called  [employer] [place] and 2 [place] studies called CAPACITY and 1 International trial called ASCEND - Most recent result was ASCEND, results released Feb 2014 with positive results - Currently FDA reviewing whether to approve drug or not. - Currently in [place] drug available in > 80 centers through expanded accesss program (safety study and compassionate use): WE ARE one of the sites  RESULTS  - Proportion of patients with > 10% decline in FVC (meaningful parameter) is around 1/3 less with those taking pirfenidone at 52 weeks. Basically prevents decline in IPF  - This has  translated into potentially reduced mortality  (pooled analysis but not powered for it) and reduced hospitalization   DOSING - Dose is 2403 mg per day in 3 divided doses and gradually escalated to this dose over 15 days - Take with food  SIDE EFFECTS - Generally well tolerated. In [place]  14% but this is only 4% more than placebo - GI dyspepsia most common but still tolerated . TAKE With FOOD - Skin irritant - So, wear high SPF (>50) sun-screen, wear hat & full length clothes   - 1:100 to 1:1000 chance of angioedema in first 90 days  - rare side effect called agranulocytosis  CONTRAINDICATIONS - Severe Hepatic impairment or ESLD (> 5 times ULN) - CKD with Cr CL < 30 and ESRD - QTc b> 5046mc (? Relative v absolute) - Pregnancy - Concomitant smoking   DRUG INTERACTIONS - Pirfenidone is metabolized through CYP1A2. So caution when there is concomitant use of a CYP enzyme inhibitor - No concomitant FLUVOXAMINE or SMOKING - Avoid grape juice - Caution when using amiodarone, ciproflox, SSRI,  -

## 2013-01-04 LAB — CYCLIC CITRUL PEPTIDE ANTIBODY, IGG: Cyclic Citrullin Peptide Ab: <2 U/mL (ref 0.0–5.0)

## 2013-01-04 LAB — SJOGRENS SYNDROME-B EXTRACTABLE NUCLEAR ANTIBODY: SSB (La) (ENA) Antibody, IgG: 52 AU/mL — ABNORMAL HIGH (ref <30)

## 2013-01-07 ENCOUNTER — Telehealth: Payer: Self-pay | Admitting: Internal Medicine

## 2013-01-07 NOTE — Telephone Encounter (Signed)
Patient returning call.

## 2013-01-07 NOTE — Telephone Encounter (Signed)
Called and spoke with pt and she is aware of lab results per MR.  She stated that she has not heard from jeanette yet but i advised her that if she does not to let us know.  Pt will do this.  Will sign off of message but will forward back to MR to make him aware.

## 2013-01-07 NOTE — Telephone Encounter (Signed)
She is supposed to go over consent. Tell her to call foundationat [phone] and tell them she is ready to roll. IT might be Jeanett Schlein is waiting for patient to call

## 2013-01-07 NOTE — Telephone Encounter (Signed)
i called and gave pt #. She voiced her understanding

## 2013-01-07 NOTE — Telephone Encounter (Signed)
lmtcb x1 

## 2013-01-07 NOTE — Assessment & Plan Note (Signed)
#  ILD  - plesae do blood work for remaining autoimmune antibodies; will cal you with results  - if these are negative or only trace positive you will qualify for the Pirfenidone EAP program   - start OTC omeprazole 82m on empty stomach daily  - please register with [email or web address] read the consent form for EAP trial  - read about pirfenidone below - will call you to advise next step   #Pirefenidone  As of 01/01/2013  Pirfenidone is NOT an approved drug in the [place]  BASICS  - Believed to function as a general inhibitor of fibrosis and inflammation Exact mechanism of action unknown - Functions by preventing worsening of IPF. Does not reverse process - Studied only in IPF (not studied in other causes of UIP like autoimmune or asbestosis) - Studied only in mild to moderate severity of IPF (DLCO > 30% and FVC > 50%) - Marketed by company called  [employer] [place] and 2 [place] studies called CAPACITY and 1 International trial called ASCEND - Most recent result was ASCEND, results released Feb 2014 with positive results - Currently FDA reviewing whether to approve drug or not. - Currently in [place] drug available in > 80 centers through expanded accesss program (safety study and compassionate use): WE ARE one of the sites  RESULTS  - Proportion of patients with > 10% decline in FVC (meaningful parameter) is around 1/3 less with those taking pirfenidone at 52 weeks. Basically prevents decline in IPF  - This has  translated into potentially reduced mortality  (pooled analysis but not powered for it) and reduced hospitalization   DOSING - Dose is 2403 mg per day in 3 divided doses and gradually escalated to this dose over 15 days - Take with food  SIDE EFFECTS - Generally well tolerated. In [place] Healthy[place]  14% but this is only 4% more than placebo - GI dyspepsia most common but still tolerated . TAKE With FOOD - Skin irritant - So, wear high SPF (>50) sun-screen, wear hat & full length clothes   - 1:100 to 1:1000 chance of angioedema in first 90 days  - rare side effect called agranulocytosis  CONTRAINDICATIONS - Severe Hepatic impairment or ESLD (> 5 times ULN) - CKD with Cr CL < 30 and ESRD - QTc b> 5045mc (? Relative v absolute) - Pregnancy - Concomitant smoking   DRUG INTERACTIONS - Pirfenidone is metabolized through CYP1A2. So caution when there is concomitant use of a CYP enzyme inhibitor - No concomitant FLUVOXAMINE or SMOKING - Avoid grape juice - Caution when using amiodarone, ciproflox, SSRI,    > 50% of this > 25 min visit spent in face to face counseling (15 min visit converted to 25 min)

## 2013-01-07 NOTE — Telephone Encounter (Signed)
Triage  This is RB  Patient who I saw. Her CCP antibody is negative. Please let her know. Find out if she has heard from [facility] about pirfenidone trial  Thanks  Dr. Brand Males, M.D., [facility].C.P Pulmonary and Critical Care Medicine Staff Physician [place] Pager: [phone], If no answer or between  15:00h - 7:00h: call [phone]  01/07/2013 12:40 PM

## 2013-01-08 ENCOUNTER — Encounter: Payer: Self-pay | Admitting: Internal Medicine

## 2013-01-08 ENCOUNTER — Ambulatory Visit (INDEPENDENT_AMBULATORY_CARE_PROVIDER_SITE_OTHER): Payer: Medicare Other | Admitting: Internal Medicine

## 2013-01-08 VITALS — BP 130/80 | HR 77 | Temp 97.5°F | Ht 62.0 in | Wt 148.4 lb

## 2013-01-08 DIAGNOSIS — J84112 Idiopathic pulmonary fibrosis: Secondary | ICD-10-CM

## 2013-01-08 DIAGNOSIS — Z5181 Encounter for therapeutic drug level monitoring: Secondary | ICD-10-CM

## 2013-01-09 ENCOUNTER — Encounter: Payer: Self-pay | Admitting: Internal Medicine

## 2013-01-09 NOTE — Assessment & Plan Note (Addendum)
Exam done to qualify for pirfenidone

## 2013-01-11 NOTE — Progress Notes (Signed)
pex for screening purposes for Ext access Pirfenidone - see forms

## 2013-04-13 ENCOUNTER — Ambulatory Visit (INDEPENDENT_AMBULATORY_CARE_PROVIDER_SITE_OTHER)
Admission: RE | Admit: 2013-04-13 | Discharge: 2013-04-13 | Disposition: A | Discharge status: Home / Self Care | Payer: Medicare Other | Source: Ambulatory Visit | Attending: Emergency Medicine | Admitting: Emergency Medicine

## 2013-04-13 ENCOUNTER — Encounter: Payer: Self-pay | Admitting: Emergency Medicine

## 2013-04-13 ENCOUNTER — Ambulatory Visit (INDEPENDENT_AMBULATORY_CARE_PROVIDER_SITE_OTHER): Payer: Medicare Other | Admitting: Emergency Medicine

## 2013-04-13 VITALS — BP 124/72 | HR 104 | Temp 97.5°F | Ht 62.5 in | Wt 148.0 lb

## 2013-04-13 DIAGNOSIS — J84112 Idiopathic pulmonary fibrosis: Secondary | ICD-10-CM

## 2013-04-13 NOTE — Progress Notes (Signed)
Subjective:    Patient ID: Tina Calderon, female    DOB: Oct 14, 1940, 72 y.o.   MRN: [id number]  HPI 72 yo female never smoker seen for initial pulmonary consult during hospital for acute respiratory failure   [facility] follow up 01/30/12 -  Pt was admitted 2 weeks ago for chest pain and dyspnea.  Found to have Acute respiratory failure with hypoxia--  Tx for possible diastolic CHF with aggressive diuresis Echo showed EF 60% , no mention of diastolic dysfxn.  HRCT consistent with ILD-  CCM consult with autoimmune workup that was unrevealing with neg ANA, ANCA, RA factor , nml ESR , CRP    Does light housework but not able walk long distances due to dyspnea  FH of pulmonary fibrosis -sibling -they were smokers. NO significant desats with walking in cardiology yesterday, desated to 93% on room air.  No cough or wheezing  No edema.   Since discharge she is feeling better with less DOE. But does get winded with walking long distances.   ROV 02/21/12 -- follows up for ILD noted during hospitalization for resp failure. Treated for possible diastolic CHF. CT scan shows some mild basilar fibrotic change. PFT's 9/3 >> probable mixed disease, no BD response, restricted volumes, decrease DLCO that corrects for Va.  She had family hx of ILD.  She had walking oximetry that she says was reassuring. She feels no better today than at hospital discharge.   ROV 03/24/12 -- ILD without clear etiology, mixed dz on PFT 02/11/12. Initiated a trial prednisone last month >> She doesn't believe that her dyspnea has changed.  Last time she did not desat.   ROV 06/23/12 -- ILD without clear etiology, mixed dz on PFT 02/11/12. Trial of pred >> weaned to off. Sent her to pulm rehab at [place], feels that it has benefited her. She isn't as SOB as before. CXR today >> stable bibasilar ILD, hiatal hernia.   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 Also possible component diastolic dysfxn. She has a family hx of ILD. She tells me that her dyspnea is worse compared with last year. She is having some spells of near syncope on exertion. Walked today to exhaustion without desaturation.   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 She has been part of the pirfenidone trial and has been on the medication. She will be transitioning to the commercial drug. Denies any side effects from the medication. I completed financial assistance forms on 04/12/13.  Due for CXR today. Not really doing exercise - she did do pulmonary rehab at [facility].       Objective:   Physical Exam Filed Vitals:   04/13/13 1000  BP: 124/72  Pulse: 104  Temp: 97.5 F (36.4 C)    GEN: A/Ox3; pleasant , NAD, well nourished   HEENT:  [place]/AT,  EACs-clear, TMs-wnl, NOSE-clear, THROAT-clear, no lesions, no postnasal drip or exudate noted.   NECK:  Supple w/ fair ROM; no JVD; normal carotid impulses w/o bruits; no thyromegaly or nodules palpated; no lymphadenopathy.  RESP  Bibasilar insp crackles, R > L  CARD:  RRR, no m/r/g  , no peripheral edema, pulses intact, no cyanosis or clubbing.  Musco: Warm bil, no deformities or joint swelling noted.   Neuro: alert, no focal deficits noted.    Skin: Warm, no lesions or rashes  Assessment & Plan:  IPF (idiopathic pulmonary fibrosis) Idiopathic interstitial lung disease with a negative autoimmune workup. She has experienced slow clinical progression despite being on the pirfenidone. trial. We are now undergoing the process for financial assistance to receive commercial drug. This should happen in the next 2 months. She will follow with the research office and then with me in January 2015. Chest x-ray today to look for radiographical progression. Defer repeat CT scan of the chest for now, repeat depending on chest x-ray and clinical  status.

## 2013-04-13 NOTE — Assessment & Plan Note (Signed)
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 She has experienced slow clinical progression despite being on the pirfenidone. trial. We are now undergoing the process for financial assistance to receive commercial drug. This should happen in the next 2 months. She will follow with the research office and then with me in January 2015. Chest x-ray today to look for radiographical progression. Defer repeat CT scan of the chest for now, repeat depending on chest x-ray and clinical status.

## 2013-04-13 NOTE — Patient Instructions (Signed)
We will continue the process for transitioning from study drug to commercial Pirfenidone Chest x-ray today Follow with Dr. Lamonte Sakai in January 2015

## 2013-06-18 ENCOUNTER — Encounter: Payer: Self-pay | Admitting: Emergency Medicine

## 2013-06-18 ENCOUNTER — Ambulatory Visit (INDEPENDENT_AMBULATORY_CARE_PROVIDER_SITE_OTHER): Payer: Medicare Other | Admitting: Emergency Medicine

## 2013-06-18 VITALS — BP 130/84 | HR 107 | Ht 62.5 in | Wt 145.8 lb

## 2013-06-18 DIAGNOSIS — J84112 Idiopathic pulmonary fibrosis: Secondary | ICD-10-CM

## 2013-06-18 NOTE — Patient Instructions (Signed)
Please continue your pirfendone as directed We will recheck your CXR next visit Follow with Dr Lamonte Sakai in 4 months or sooner if you have any problems.

## 2013-06-18 NOTE — Assessment & Plan Note (Signed)
Clinically stable. Has been on pirfenidone trial. Will continue your current regimen and change to commercial product when available.

## 2013-06-18 NOTE — Progress Notes (Signed)
Subjective:    Patient ID: Tina Calderon, female    DOB: 1941-03-08, 73 y.o.   MRN: [id number]  HPI 73 yo female never smoker seen for initial pulmonary consult during hospital for acute respiratory failure   [facility] follow up 01/30/12 -  Pt was admitted 2 weeks ago for chest pain and dyspnea.  Found to have Acute respiratory failure with hypoxia--  Tx for possible diastolic CHF with aggressive diuresis Echo showed EF 60% , no mention of diastolic dysfxn.  HRCT consistent with ILD-  CCM consult with autoimmune workup that was unrevealing with neg ANA, ANCA, RA factor , nml ESR , CRP    Does light housework but not able walk long distances due to dyspnea  FH of pulmonary fibrosis -sibling -they were smokers. NO significant desats with walking in cardiology yesterday, desated to 93% on room air.  No cough or wheezing  No edema.   Since discharge she is feeling better with less DOE. But does get winded with walking long distances.   ROV 02/21/12 -- follows up for ILD noted during hospitalization for resp failure. Treated for possible diastolic CHF. CT scan shows some mild basilar fibrotic change. PFT's 9/3 >> probable mixed disease, no BD response, restricted volumes, decrease DLCO that corrects for Va.  She had family hx of ILD.  She had walking oximetry that she says was reassuring. She feels no better today than at hospital discharge.   ROV 03/24/12 -- ILD without clear etiology, mixed dz on PFT 02/11/12. Initiated a trial prednisone last month >> She doesn't believe that her dyspnea has changed.  Last time she did not desat.   ROV 06/23/12 -- ILD without clear etiology, mixed dz on PFT 02/11/12. Trial of pred >> weaned to off. Sent her to pulm rehab at [place], feels that it has benefited her. She isn't as SOB as before. CXR today >> stable bibasilar ILD, hiatal hernia.   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 Also possible component diastolic dysfxn. She has a family hx of ILD. She tells me that her dyspnea is worse compared with last year. She is having some spells of near syncope on exertion. Walked today to exhaustion without desaturation.   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 She has been part of the pirfenidone trial and has been on the medication. She will be transitioning to the commercial drug. Denies any side effects from the medication. I completed financial assistance forms on 04/12/13.  Due for CXR today. Not really doing exercise - she did do pulmonary rehab at [facility].    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 Last CXR 05/19/13, last CT scan 01/24/12. She is still on the pirfenidone trial, receiving study drug. She is fairly stable. Will be converted to the commercial med soon. Tolerates soon.      Objective:   Physical Exam Filed Vitals:   06/18/13 1511  BP: 130/84  Pulse: 107  Height: 5' 2.5" (1.588 m)  Weight: 145 lb 12.8 oz (66.134 kg)  SpO2: 98%   GEN: A/Ox3; pleasant , NAD, well nourished   HEENT:  [place]/AT,  EACs-clear, TMs-wnl, NOSE-clear, THROAT-clear, no lesions, no postnasal drip or exudate noted.   NECK:  Supple w/ fair ROM; no JVD; normal carotid impulses w/o bruits; no thyromegaly or nodules palpated; no  lymphadenopathy.  RESP  Bibasilar insp crackles, R > L, less prominent than last visit.   CARD:  RRR, no m/r/g  , no peripheral edema, pulses intact, no cyanosis or clubbing.  Musco: Warm bil, no deformities or joint swelling noted.   Neuro: alert, no focal deficits noted.    Skin: Warm, no lesions or rashes      Assessment & Plan:  IPF (idiopathic pulmonary fibrosis) Clinically stable. Has been on pirfenidone trial. Will continue your current regimen and change to commercial product when  available.

## 2013-09-02 ENCOUNTER — Encounter: Payer: Self-pay | Admitting: Adult Health

## 2013-09-02 ENCOUNTER — Ambulatory Visit (INDEPENDENT_AMBULATORY_CARE_PROVIDER_SITE_OTHER): Payer: Medicare Other | Admitting: Adult Health

## 2013-09-02 ENCOUNTER — Other Ambulatory Visit: Payer: Self-pay | Admitting: Emergency Medicine

## 2013-09-02 VITALS — BP 132/84 | HR 102 | Temp 98.2°F | Ht 62.5 in | Wt 142.8 lb

## 2013-09-02 DIAGNOSIS — J069 Acute upper respiratory infection, unspecified: Secondary | ICD-10-CM | POA: Insufficient documentation

## 2013-09-02 MED ORDER — AZITHROMYCIN 250 MG PO TABS: ORAL_TABLET | ORAL | Status: AC

## 2013-09-02 NOTE — Progress Notes (Signed)
Subjective:    Patient ID: Tina Calderon, female    DOB: 1941-01-04, 73 y.o.   MRN: [id number]  HPI 73 yo female never smoker seen for initial pulmonary consult during hospital for acute respiratory failure   [facility] follow up 01/30/12 -  Pt was admitted 2 weeks ago for chest pain and dyspnea.  Found to have Acute respiratory failure with hypoxia--  Tx for possible diastolic CHF with aggressive diuresis Echo showed EF 60% , no mention of diastolic dysfxn.  HRCT consistent with ILD-  CCM consult with autoimmune workup that was unrevealing with neg ANA, ANCA, RA factor , nml ESR , CRP    Does light housework but not able walk long distances due to dyspnea  FH of pulmonary fibrosis -sibling -they were smokers. NO significant desats with walking in cardiology yesterday, desated to 93% on room air.  No cough or wheezing  No edema.   Since discharge she is feeling better with less DOE. But does get winded with walking long distances.   ROV 02/21/12 -- follows up for ILD noted during hospitalization for resp failure. Treated for possible diastolic CHF. CT scan shows some mild basilar fibrotic change. PFT's 9/3 >> probable mixed disease, no BD response, restricted volumes, decrease DLCO that corrects for Va.  She had family hx of ILD.  She had walking oximetry that she says was reassuring. She feels no better today than at hospital discharge.   ROV 03/24/12 -- ILD without clear etiology, mixed dz on PFT 02/11/12. Initiated a trial prednisone last month >> She doesn't believe that her dyspnea has changed.  Last time she did not desat.   ROV 06/23/12 -- ILD without clear etiology, mixed dz on PFT 02/11/12. Trial of pred >> weaned to off. Sent her to pulm rehab at [place], feels that it has benefited her. She isn't as SOB as before. CXR today >> stable bibasilar ILD, hiatal hernia.   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 Also possible component diastolic dysfxn. She has a family hx of ILD. She tells me that her dyspnea is worse compared with last year. She is having some spells of near syncope on exertion. Walked today to exhaustion without desaturation.   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 She has been part of the pirfenidone trial and has been on the medication. She will be transitioning to the commercial drug. Denies any side effects from the medication. I completed financial assistance forms on 04/12/13.  Due for CXR today. Not really doing exercise - she did do pulmonary rehab at [facility].    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 Last CXR 05/19/13, last CT scan 01/24/12. She is still on the pirfenidone trial, receiving study drug. She is fairly stable. Will be converted to the commercial med soon. Tolerates soon.    09/02/2013 Acute OV  Complains of increased SOB, wheezing, chest tightness, HA, head congestion w/ green drainage, PND, prod cough with green mucus, chills x2 days. Denies fever, hemoptysis, nausea, vomiting, recent travel or abx use. No new meds.  Has not taken any meds for tx of sx.      Objective:   Physical Exam   GEN: A/Ox3; pleasant , NAD, elderly   HEENT:  [place]/AT,  EACs-clear, TMs-wnl, NOSE-clear, THROAT-clear, no lesions, no postnasal drip or exudate  noted.   NECK:  Supple w/ fair ROM; no JVD; normal carotid impulses w/o bruits; no thyromegaly or nodules palpated; no lymphadenopathy.  RESP  Bibasilar insp crackles, R > L, less prominent than last visit.   CARD:  RRR, no m/r/g  , no peripheral edema, pulses intact, no cyanosis or clubbing.  Musco: Warm bil, no deformities or joint swelling noted.   Neuro: alert, no focal deficits noted.    Skin: Warm, no lesions or rashes      Assessment & Plan:

## 2013-09-02 NOTE — Patient Instructions (Addendum)
Zpack take as directed .  Mucinex DM Twice daily As needed  Cough/congestion  Tylenol As needed   Fluids and rest  Please contact office for sooner follow up if symptoms do not improve or worsen or seek Follow up Dr. Lamonte Sakai  As planned

## 2013-09-02 NOTE — Assessment & Plan Note (Signed)
Zpack take as directed .  Mucinex DM Twice daily As needed  Cough/congestion  Tylenol As needed   Fluids and rest  Please contact office for sooner follow up if symptoms do not improve or worsen or seek Follow up Dr. Byrum  As planned  

## 2013-10-13 ENCOUNTER — Ambulatory Visit: Payer: Medicare Other | Admitting: Emergency Medicine

## 2013-10-29 ENCOUNTER — Encounter: Payer: Self-pay | Admitting: Emergency Medicine

## 2013-10-29 ENCOUNTER — Ambulatory Visit (INDEPENDENT_AMBULATORY_CARE_PROVIDER_SITE_OTHER): Payer: Medicare Other | Admitting: Emergency Medicine

## 2013-10-29 ENCOUNTER — Other Ambulatory Visit (INDEPENDENT_AMBULATORY_CARE_PROVIDER_SITE_OTHER): Payer: Medicare Other

## 2013-10-29 VITALS — BP 132/86 | HR 103 | Ht 62.5 in | Wt 149.4 lb

## 2013-10-29 DIAGNOSIS — D649 Anemia, unspecified: Secondary | ICD-10-CM

## 2013-10-29 DIAGNOSIS — J84112 Idiopathic pulmonary fibrosis: Secondary | ICD-10-CM

## 2013-10-29 DIAGNOSIS — E079 Disorder of thyroid, unspecified: Secondary | ICD-10-CM

## 2013-10-29 LAB — CBC WITH DIFFERENTIAL/PLATELET
Basophils Absolute: 0 10*3/uL (ref 0.0–0.1)
Basophils Relative: 0.5 % (ref 0.0–3.0)
Eosinophils Absolute: 0.2 10*3/uL (ref 0.0–0.7)
Eosinophils Relative: 3.1 % (ref 0.0–5.0)
HEMATOCRIT: 38.1 % (ref 36.0–46.0)
HEMOGLOBIN: 12.7 g/dL (ref 12.0–15.0)
LYMPHS ABS: 1.6 10*3/uL (ref 0.7–4.0)
Lymphocytes Relative: 21.6 % (ref 12.0–46.0)
MCHC: 33.3 g/dL (ref 30.0–36.0)
MCV: 81.8 fl (ref 78.0–100.0)
MONOS PCT: 11.8 % (ref 3.0–12.0)
Monocytes Absolute: 0.9 10*3/uL (ref 0.1–1.0)
NEUTROS ABS: 4.6 10*3/uL (ref 1.4–7.7)
Neutrophils Relative %: 63 % (ref 43.0–77.0)
Platelets: 240 10*3/uL (ref 150.0–400.0)
RBC: 4.66 Mil/uL (ref 3.87–5.11)
RDW: 14.4 % (ref 11.5–15.5)
WBC: 7.3 10*3/uL (ref 4.0–10.5)

## 2013-10-29 LAB — HEPATIC FUNCTION PANEL
ALBUMIN: 4.1 g/dL (ref 3.5–5.2)
ALT: 25 U/L (ref 0–35)
AST: 31 U/L (ref 0–37)
Alkaline Phosphatase: 64 U/L (ref 39–117)
Bilirubin, Direct: 0 mg/dL (ref 0.0–0.3)
TOTAL PROTEIN: 7 g/dL (ref 6.0–8.3)
Total Bilirubin: 0.5 mg/dL (ref 0.2–1.2)

## 2013-10-29 LAB — TSH: TSH: 0.51 u[IU]/mL (ref 0.35–4.50)

## 2013-10-29 NOTE — Assessment & Plan Note (Signed)
-  repeat CT - check labs and continue pirfenidone tid

## 2013-10-29 NOTE — Patient Instructions (Signed)
We will repeat your CT scan chest to compare with 2013 We will check blood work today Continue your pirfenidone three times a day Walking oximetry today did not show low oxygen levels.  Follow with Dr Lamonte Sakai in 1 month

## 2013-10-29 NOTE — Assessment & Plan Note (Signed)
Recheck CBC given her fatigue

## 2013-10-29 NOTE — Assessment & Plan Note (Signed)
Hypothyroidism > check TSH today given her fatigue

## 2013-10-29 NOTE — Progress Notes (Signed)
Subjective:    Patient ID: Tina Calderon, female    DOB: 12/14/1940, 72 y.o.   MRN: [id number]  HPI 72 yo female never smoker seen for initial pulmonary consult during hospital for acute respiratory failure   [facility] 01[facility]12 -  Pt was admitted 2 weeks ago for chest pain and dyspnea.  Found to have Acute respiratory failure with hypoxia--  Tx for possible diastolic CHF with aggressive diuresis Echo showed EF 60% , no mention of diastolic dysfxn.  HRCT consistent with ILD-  CCM consult with autoimmune workup that was unrevealing with neg ANA, ANCA, RA factor , nml ESR , CRP    Does light housework but not able walk long distances due to dyspnea  FH of pulmonary fibrosis -sibling -they were smokers. NO significant desats with walking in cardiology yesterday, desated to 93% on room air.  No cough or wheezing  No edema.   Since discharge she is feeling better with less DOE. But does get winded with walking long distances.   ROV 02/21/12 -- follows up for ILD noted during hospitalization for resp failure. Treated for possible diastolic CHF. CT scan shows some mild basilar fibrotic change. PFT's 9/3 >> probable mixed disease, no BD response, restricted volumes, decrease DLCO that corrects for Va.  She had family hx of ILD.  She had walking oximetry that she says was reassuring. She feels no better today than at hospital discharge.   ROV 03/24/12 -- ILD without clear etiology, mixed dz on PFT 02/11/12. Initiated a trial prednisone last month >> She doesn't believe that her dyspnea has changed.  Last time she did not desat.   ROV 06/23/12 -- ILD without clear etiology, mixed dz on PFT 02/11/12. Trial of pred >> weaned to off. Sent her to pulm rehab at [place], feels that it has benefited her. She isn't as SOB as before. CXR today >> stable bibasilar ILD, hiatal hernia.   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 Also possible component diastolic dysfxn. She has a family hx of ILD. She tells me that her dyspnea is worse compared with last year. She is having some spells of near syncope on exertion. Walked today to exhaustion without desaturation.   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 She has been part of the pirfenidone trial and has been on the medication. She will be transitioning to the commercial drug. Denies any side effects from the medication. I completed financial assistance forms on 04/12/13.  Due for CXR today. Not really doing exercise - she did do pulmonary rehab at [place].    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 Last CXR 05/19/13, last CT scan 01/24/12. She is still on the pirfenidone trial, receiving study drug. She is fairly stable. Will be converted to the commercial med soon. Tolerates soon.   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 Returns for f/u. She is now on pirfenidone 3 pills tid and is tolerating. She is having more problems with fatigue and dyspnea with exertion - has lay down after eating dinner, with chores. Her synthroid hasn't been adjusted in a long time.  Her last labs were March 3, 15.      Objective:   Physical Exam Filed Vitals:   10/29/13 1010  BP: 132/86    Pulse: 103  Height: 5' 2.5" (1.588 m)  Weight: 149 lb 6.4 oz (67.767 kg)  SpO2: 97%   GEN: A/Ox3; pleasant , NAD, well nourished   HEENT:  [place]/AT,  EACs-clear, TMs-wnl, NOSE-clear, THROAT-clear, no lesions, no postnasal drip or exudate noted.   NECK:  Supple w/ fair ROM; no JVD; normal carotid impulses w/o bruits; no thyromegaly or nodules palpated; no lymphadenopathy.  RESP  Bibasilar insp crackles, R > L  CARD:  RRR, no m/r/g  , no peripheral edema, pulses intact, no  cyanosis or clubbing.  Musco: Warm bil, no deformities or joint swelling noted.   Neuro: alert, no focal deficits noted.    Skin: Warm, no lesions or rashes      Assessment & Plan:  IPF (idiopathic pulmonary fibrosis) - repeat CT - check labs and continue pirfenidone tid  Thyroid disease Hypothyroidism > check TSH today given her fatigue  Anemia Recheck CBC given her fatigue

## 2013-11-05 ENCOUNTER — Ambulatory Visit (INDEPENDENT_AMBULATORY_CARE_PROVIDER_SITE_OTHER)
Admission: RE | Admit: 2013-11-05 | Discharge: 2013-11-05 | Disposition: A | Discharge status: Home / Self Care | Payer: Medicare Other | Source: Ambulatory Visit | Attending: Emergency Medicine | Admitting: Emergency Medicine

## 2013-11-05 DIAGNOSIS — J84112 Idiopathic pulmonary fibrosis: Secondary | ICD-10-CM

## 2013-11-23 ENCOUNTER — Encounter: Payer: Self-pay | Admitting: Emergency Medicine

## 2013-11-23 ENCOUNTER — Encounter (INDEPENDENT_AMBULATORY_CARE_PROVIDER_SITE_OTHER): Payer: Self-pay

## 2013-11-23 ENCOUNTER — Ambulatory Visit (INDEPENDENT_AMBULATORY_CARE_PROVIDER_SITE_OTHER): Payer: Medicare Other | Admitting: Emergency Medicine

## 2013-11-23 VITALS — BP 128/86 | HR 83 | Ht 62.5 in | Wt 149.0 lb

## 2013-11-23 DIAGNOSIS — J84112 Idiopathic pulmonary fibrosis: Secondary | ICD-10-CM

## 2013-11-23 MED ORDER — OMEPRAZOLE MAGNESIUM 20 MG PO TBEC: 20.0000 mg | DELAYED_RELEASE_TABLET | Freq: Every day | ORAL | Status: DC

## 2013-11-23 NOTE — Patient Instructions (Signed)
We will check labs in 2 months Please continue your pirfenidone as you have been taking it Follow with Dr Lamonte Sakai in 2 months

## 2013-11-23 NOTE — Assessment & Plan Note (Signed)
Stable by CT scan chest. We will check LFT in 2 months and if stable then space out to q82month. Continue pirfenidone same dosing. rov 2

## 2013-11-23 NOTE — Progress Notes (Signed)
Subjective:    Patient ID: Tina Calderon, female    DOB: 1941/03/19, 73 y.o.   MRN: [id number]  HPI 73 yo female never smoker seen for initial pulmonary consult during hospital for acute respiratory failure   [facility] follow up 01/30/12 -  Pt was admitted 2 weeks ago for chest pain and dyspnea.  Found to have Acute respiratory failure with hypoxia--  Tx for possible diastolic CHF with aggressive diuresis Echo showed EF 60% , no mention of diastolic dysfxn.  HRCT consistent with ILD-  CCM consult with autoimmune workup that was unrevealing with neg ANA, ANCA, RA factor , nml ESR , CRP    Does light housework but not able walk long distances due to dyspnea  FH of pulmonary fibrosis -sibling -they were smokers. NO significant desats with walking in cardiology yesterday, desated to 93% on room air.  No cough or wheezing  No edema.   Since discharge she is feeling better with less DOE. But does get winded with walking long distances.   ROV 02/21/12 -- follows up for ILD noted during hospitalization for resp failure. Treated for possible diastolic CHF. CT scan shows some mild basilar fibrotic change. PFT's 9/3 >> probable mixed disease, no BD response, restricted volumes, decrease DLCO that corrects for Va.  She had family hx of ILD.  She had walking oximetry that she says was reassuring. She feels no better today than at hospital discharge.   ROV 03/24/12 -- ILD without clear etiology, mixed dz on PFT 02/11/12. Initiated a trial prednisone last month >> She doesn't believe that her dyspnea has changed.  Last time she did not desat.   ROV 06/23/12 -- ILD without clear etiology, mixed dz on PFT 02/11/12. Trial of pred >> weaned to off. Sent her to pulm rehab at [facility] [place], feels that it has benefited her. She isn't as SOB as before. CXR today >> stable bibasilar ILD, hiatal hernia.   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 Also possible component diastolic dysfxn. She has a family hx of ILD. She tells me that her dyspnea is worse compared with last year. She is having some spells of near syncope on exertion. Walked today to exhaustion without desaturation.   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 She has been part of the pirfenidone trial and has been on the medication. She will be transitioning to the commercial drug. Denies any side effects from the medication. I completed financial assistance forms on 04/12/13.  Due for CXR today. Not really doing exercise - she did do pulmonary rehab at [facility].    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 Last CXR 05/19/13, last CT scan 01/24/12. She is still on the pirfenidone trial, receiving study drug. She is fairly stable. Will be converted to the commercial med soon. Tolerates soon.   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 Returns for f/u. She is now on pirfenidone 3 pills tid and is tolerating. She is having more problems with fatigue and dyspnea with exertion - has lay down after eating dinner, with chores. Her synthroid hasn't been adjusted in a long time.  Her last labs were March 3, 15.   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 She is on  pirfenidone 3 pills tid. She has been tolerating well from GI standpoint. TSH normal. She has some fatigue, otherwise doing well. She needs LFT done in 3 months and after that we can go to every 6 months,.      Objective:   Physical Exam Filed Vitals:   11/23/13 1055  BP: 128/86  Pulse: 83  Height: 5' 2.5" (1.588 m)  Weight: 149 lb (67.586 kg)  SpO2: 97%   GEN: A/Ox3; pleasant , NAD, well nourished   HEENT:  [place]/AT,  EACs-clear, TMs-wnl,  NOSE-clear, THROAT-clear, no lesions, no postnasal drip or exudate noted.   NECK:  Supple w/ fair ROM; no JVD; normal carotid impulses w/o bruits; no thyromegaly or nodules palpated; no lymphadenopathy.  RESP  Bibasilar insp crackles, R > L  CARD:  RRR, no m/r/g  , no peripheral edema, pulses intact, no cyanosis or clubbing.  Musco: Warm bil, no deformities or joint swelling noted.   Neuro: alert, no focal deficits noted.    Skin: Warm, no lesions or rashes    CT chest 11/05/13 --  COMPARISON: 01/24/2012.  FINDINGS:  A 1.6 cm low-attenuation lesion in the left lobe of the thyroid is  unchanged. Mediastinal lymph nodes are not enlarged by CT size  criteria. Hilar regions are difficult to definitively evaluate  without IV contrast. No axillary adenopathy. Atherosclerotic  calcification of the arterial vasculature, including coronary  arteries. Heart is mildly enlarged. No pericardial effusion.  Moderate hiatal hernia.  Image quality is somewhat degraded by respiratory motion. There is  mild subpleural reticulation with slight basilar predominance.  Traction bronchiolectasis is seen primarily at the lung bases.  Findings appear stable from 01/24/2012. A few scattered pulmonary  nodules measure 4 mm or less in size and are unchanged from  01/24/2012, rendering them benign. No pleural fluid. Airway is  unremarkable. Mild air trapping on inspiratory and expiratory  imaging.  Incidental imaging of the upper abdomen shows a 7 mm low-attenuation  lesion in the left hepatic lobe, unchanged and likely a cyst or  hemangioma. Visualized portions of the liver, gallbladder, adrenal  glands, spleen, pancreas, stomach and bowel are otherwise grossly  unremarkable. No upper abdominal adenopathy. No worrisome lytic or  sclerotic lesions. Degenerative changes are seen in the spine.  IMPRESSION:  1. 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  2. Coronary artery calcification.      Assessment & Plan:  IPF (idiopathic pulmonary fibrosis) Stable by CT scan chest. We will check LFT in 2 months and if stable then space out to q3month. Continue pirfenidone same dosing. rov 2

## 2013-11-30 ENCOUNTER — Other Ambulatory Visit: Payer: Self-pay | Admitting: *Deleted

## 2013-11-30 MED ORDER — PIRFENIDONE 267 MG PO CAPS: ORAL_CAPSULE | ORAL | Status: DC

## 2013-11-30 NOTE — Telephone Encounter (Signed)
New rx for esbriet has been faxed to [phone]. Have made pt aware this has been done Nothing further needed

## 2013-12-18 ENCOUNTER — Emergency Department: Payer: Self-pay | Admitting: Emergency Medicine

## 2013-12-18 LAB — CBC WITH DIFFERENTIAL/PLATELET
Basophil #: 0 10*3/uL (ref 0.0–0.1)
Basophil %: 0.1 %
Eosinophil #: 0.1 10*3/uL (ref 0.0–0.7)
Eosinophil %: 1.2 %
HCT: 38.8 % (ref 35.0–47.0)
HGB: 12.4 g/dL (ref 12.0–16.0)
LYMPHS ABS: 0.6 10*3/uL — AB (ref 1.0–3.6)
LYMPHS PCT: 5.6 %
MCH: 26.6 pg (ref 26.0–34.0)
MCHC: 32 g/dL (ref 32.0–36.0)
MCV: 83 fL (ref 80–100)
MONO ABS: 0.7 x10 3/mm (ref 0.2–0.9)
Monocyte %: 6.4 %
NEUTROS ABS: 9.1 10*3/uL — AB (ref 1.4–6.5)
Neutrophil %: 86.7 %
Platelet: 173 10*3/uL (ref 150–440)
RBC: 4.66 10*6/uL (ref 3.80–5.20)
RDW: 14.4 % (ref 11.5–14.5)
WBC: 10.5 10*3/uL (ref 3.6–11.0)

## 2013-12-18 LAB — URINALYSIS, COMPLETE
BILIRUBIN, UR: NEGATIVE
Bacteria: NONE SEEN
Blood: NEGATIVE
Glucose,UR: NEGATIVE mg/dL (ref 0–75)
Ketone: NEGATIVE
Nitrite: NEGATIVE
PH: 6 (ref 4.5–8.0)
Protein: NEGATIVE
RBC,UR: 4 /HPF (ref 0–5)
Specific Gravity: 1.015 (ref 1.003–1.030)
Squamous Epithelial: <1

## 2013-12-18 LAB — LIPASE, BLOOD: LIPASE: 139 U/L (ref 73–393)

## 2013-12-18 LAB — COMPREHENSIVE METABOLIC PANEL
ALBUMIN: 3.5 g/dL (ref 3.4–5.0)
ALK PHOS: 82 U/L
Anion Gap: 11 (ref 7–16)
BILIRUBIN TOTAL: 0.3 mg/dL (ref 0.2–1.0)
BUN: 17 mg/dL (ref 7–18)
CHLORIDE: 103 mmol/L (ref 98–107)
CREATININE: 0.88 mg/dL (ref 0.60–1.30)
Calcium, Total: 8.5 mg/dL (ref 8.5–10.1)
Co2: 24 mmol/L (ref 21–32)
EGFR (African American): >60
EGFR (Non-African Amer.): >60
GLUCOSE: 125 mg/dL — AB (ref 65–99)
Osmolality: 279 (ref 275–301)
Potassium: 3.4 mmol/L — ABNORMAL LOW (ref 3.5–5.1)
SGOT(AST): 32 U/L (ref 15–37)
SGPT (ALT): 34 U/L (ref 12–78)
SODIUM: 138 mmol/L (ref 136–145)
Total Protein: 7.1 g/dL (ref 6.4–8.2)

## 2013-12-18 LAB — TROPONIN I
TROPONIN-I: 0.07 ng/mL — AB
Troponin-I: 0.07 ng/mL — ABNORMAL HIGH

## 2013-12-20 LAB — URINE CULTURE

## 2014-01-28 ENCOUNTER — Encounter: Payer: Self-pay | Admitting: Emergency Medicine

## 2014-01-28 ENCOUNTER — Telehealth: Payer: Self-pay | Admitting: Emergency Medicine

## 2014-01-28 ENCOUNTER — Ambulatory Visit (INDEPENDENT_AMBULATORY_CARE_PROVIDER_SITE_OTHER): Payer: Medicare Other | Admitting: Emergency Medicine

## 2014-01-28 ENCOUNTER — Other Ambulatory Visit (INDEPENDENT_AMBULATORY_CARE_PROVIDER_SITE_OTHER): Payer: Medicare Other

## 2014-01-28 VITALS — BP 134/76 | HR 89 | Ht 62.5 in | Wt 146.0 lb

## 2014-01-28 DIAGNOSIS — J84112 Idiopathic pulmonary fibrosis: Secondary | ICD-10-CM

## 2014-01-28 LAB — HEPATIC FUNCTION PANEL
ALBUMIN: 3.7 g/dL (ref 3.5–5.2)
ALT: 26 U/L (ref 0–35)
AST: 28 U/L (ref 0–37)
Alkaline Phosphatase: 65 U/L (ref 39–117)
Bilirubin, Direct: 0 mg/dL (ref 0.0–0.3)
TOTAL PROTEIN: 6.4 g/dL (ref 6.0–8.3)
Total Bilirubin: 0.6 mg/dL (ref 0.2–1.2)

## 2014-01-28 NOTE — Assessment & Plan Note (Signed)
-  repeat PFT now to compare with priors - continue pirfenidone - review LFT today; if normal then change to q6 months - she will go to dermatology Dr Ronnald Ramp to assess her rash on her forearms; ? A relationship to her meds - rov 6

## 2014-01-28 NOTE — Progress Notes (Signed)
Subjective:    Patient ID: Tina Calderon, female    DOB: April 12, 1941, 73 y.o.   MRN: [id number]  HPI 73 yo female never smoker seen for initial pulmonary consult during hospital for acute respiratory failure   [facility] follow up 01/30/12 -  Pt was admitted 2 weeks ago for chest pain and dyspnea.  Found to have Acute respiratory failure with hypoxia--  Tx for possible diastolic CHF with aggressive diuresis Echo showed EF 60% , no mention of diastolic dysfxn.  HRCT consistent with ILD-  CCM consult with autoimmune workup that was unrevealing with neg ANA, ANCA, RA factor , nml ESR , CRP    Does light housework but not able walk long distances due to dyspnea  FH of pulmonary fibrosis -sibling -they were smokers. NO significant desats with walking in cardiology yesterday, desated to 93% on room air.  No cough or wheezing  No edema.   Since discharge she is feeling better with less DOE. But does get winded with walking long distances.   ROV 02/21/12 -- follows up for ILD noted during hospitalization for resp failure. Treated for possible diastolic CHF. CT scan shows some mild basilar fibrotic change. PFT's 9/3 >> probable mixed disease, no BD response, restricted volumes, decrease DLCO that corrects for Va.  She had family hx of ILD.  She had walking oximetry that she says was reassuring. She feels no better today than at hospital discharge.   ROV 03/24/12 -- ILD without clear etiology, mixed dz on PFT 02/11/12. Initiated a trial prednisone last month >> She doesn't believe that her dyspnea has changed.  Last time she did not desat.   ROV 06/23/12 -- ILD without clear etiology, mixed dz on PFT 02/11/12. Trial of pred >> weaned to off. Sent her to pulm rehab at [place], feels that it has benefited her. She isn't as SOB as before. CXR today >> stable bibasilar ILD, hiatal hernia.   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 Also possible component diastolic dysfxn. She has a family hx of ILD. She tells me that her dyspnea is worse compared with last year. She is having some spells of near syncope on exertion. Walked today to exhaustion without desaturation.   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 She has been part of the pirfenidone trial and has been on the medication. She will be transitioning to the commercial drug. Denies any side effects from the medication. I completed financial assistance forms on 04/12/13.  Due for CXR today. Not really doing exercise - she did do pulmonary rehab at [facility].    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 Last CXR 05/19/13, last CT scan 01/24/12. She is still on the pirfenidone trial, receiving study drug. She is fairly stable. Will be converted to the commercial med soon. Tolerates soon.   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 Returns for f/u. She is now on pirfenidone 3 pills tid and is tolerating. She is having more problems with fatigue and dyspnea with exertion - has lay down after eating dinner, with chores. Her synthroid hasn't been adjusted in a long time.  Her last labs were March 3, 15.   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 She is on  pirfenidone 3 pills tid. She has been tolerating well from GI standpoint. TSH normal. She has some fatigue, otherwise doing well. She needs LFT done in 3 months and after that we can go to every 6 months,.   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 On pirfenidone 3 pills tid since August 2014. Last CT scan 5/'15 was stable, described as an NSIP pattern.  LFT were done today, not yet  available. She has noticed some slightly raised pale pink macules, not pruritic, since last month. She has been careful to wear sunscreen.      Objective:   Physical Exam Filed Vitals:   01/28/14 1046  BP: 134/76  Pulse: 89  Height: 5' 2.5" (1.588 m)  Weight: 146 lb (66.225 kg)  SpO2: 96%   GEN: A/Ox3; pleasant , NAD, well nourished   HEENT:  [place]/AT,  EACs-clear, TMs-wnl, NOSE-clear, THROAT-clear, no lesions, no postnasal drip or exudate noted.   NECK:  Supple w/ fair ROM; no JVD; normal carotid impulses w/o bruits; no thyromegaly or nodules palpated; no lymphadenopathy.  RESP  Bibasilar insp crackles, R > L  CARD:  RRR, no m/r/g  , no peripheral edema, pulses intact, no cyanosis or clubbing.  Musco: Warm bil, no deformities or joint swelling noted.   Neuro: alert, no focal deficits noted.    Skin: Warm, no lesions or rashes     CT chest 11/05/13 --  COMPARISON: 01/24/2012.  FINDINGS:  A 1.6 cm low-attenuation lesion in the left lobe of the thyroid is  unchanged. Mediastinal lymph nodes are not enlarged by CT size  criteria. Hilar regions are difficult to definitively evaluate  without IV contrast. No axillary adenopathy. Atherosclerotic  calcification of the arterial vasculature, including coronary  arteries. Heart is mildly enlarged. No pericardial effusion.  Moderate hiatal hernia.  Image quality is somewhat degraded by respiratory motion. There is  mild subpleural reticulation with slight basilar predominance.  Traction bronchiolectasis is seen primarily at the lung bases.  Findings appear stable from 01/24/2012. A few scattered pulmonary  nodules measure 4 mm or less in size and are unchanged from  01/24/2012, rendering them benign. No pleural fluid. Airway is  unremarkable. Mild air trapping on inspiratory and expiratory  imaging.  Incidental imaging of the upper abdomen shows a 7 mm low-attenuation  lesion in the left hepatic lobe, unchanged and likely a cyst or   hemangioma. Visualized portions of the liver, gallbladder, adrenal  glands, spleen, pancreas, stomach and bowel are otherwise grossly  unremarkable. No upper abdominal adenopathy. No worrisome lytic or  sclerotic lesions. Degenerative changes are seen in the spine.  IMPRESSION:  1. 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  2. Coronary artery calcification.      Assessment & Plan:  IPF (idiopathic pulmonary fibrosis) - repeat PFT now to compare with priors - continue pirfenidone - review LFT today; if normal then change to q6 months - she will go to dermatology Dr Ronnald Ramp to assess her rash on her forearms; ? A relationship to her meds - rov 6

## 2014-01-28 NOTE — Telephone Encounter (Signed)
Spoke with pt, she is aware of results and recs.  Nothing further needed.

## 2014-01-28 NOTE — Telephone Encounter (Signed)
Message copied by Len Blalock on Fri Jan 28, 2014  4:49 PM ------      Message from: Collene Gobble      Created: Fri Jan 28, 2014  1:31 PM       Please inform pt that LFT are normal. Please continue same medications. RSB ------

## 2014-01-28 NOTE — Patient Instructions (Signed)
We will scheduled full pulmonary function testing Continue your pirfenidone as you are taking it See Dr Ronnald Ramp with Dermatology. We will send our notes to his office.  We will decrease the frequency of your blood work to every 6 months.  Follow with Dr Lamonte Sakai in 6 months or sooner if you have any problems

## 2014-02-15 ENCOUNTER — Telehealth: Payer: Self-pay | Admitting: Emergency Medicine

## 2014-02-15 ENCOUNTER — Ambulatory Visit: Payer: Medicare Other | Admitting: Emergency Medicine

## 2014-02-15 DIAGNOSIS — J84112 Idiopathic pulmonary fibrosis: Secondary | ICD-10-CM

## 2014-02-15 LAB — PULMONARY FUNCTION TEST
DL/VA % PRED: 104 %
DL/VA: 4.75 ml/min/mmHg/L
DLCO UNC: 14.3 ml/min/mmHg
DLCO unc % pred: 66 %
FEF 25-75 Post: 1.79 L/sec
FEF 25-75 Pre: 1.63 L/sec
FEF2575-%CHANGE-POST: 9 %
FEF2575-%PRED-PRE: 102 %
FEF2575-%Pred-Post: 112 %
FEV1-%CHANGE-POST: 1 %
FEV1-%Pred-Post: 69 %
FEV1-%Pred-Pre: 68 %
FEV1-Post: 1.35 L
FEV1-Pre: 1.33 L
FEV1FVC-%Change-Post: 3 %
FEV1FVC-%Pred-Pre: 112 %
FEV6-%CHANGE-POST: -2 %
FEV6-%PRED-PRE: 63 %
FEV6-%Pred-Post: 62 %
FEV6-Post: 1.54 L
FEV6-Pre: 1.58 L
FEV6FVC-%Change-Post: 0 %
FEV6FVC-%PRED-POST: 104 %
FEV6FVC-%Pred-Pre: 105 %
FVC-%Change-Post: -1 %
FVC-%PRED-POST: 59 %
FVC-%Pred-Pre: 60 %
FVC-POST: 1.55 L
FVC-PRE: 1.58 L
Post FEV1/FVC ratio: 87 %
Post FEV6/FVC ratio: 99 %
Pre FEV1/FVC ratio: 84 %
Pre FEV6/FVC Ratio: 100 %
RV % PRED: 60 %
RV: 1.31 L
TLC % PRED: 64 %
TLC: 3.05 L

## 2014-02-15 NOTE — Progress Notes (Signed)
PFT done today. 

## 2014-02-15 NOTE — Telephone Encounter (Signed)
Immunization chart updated. Nothing further needed

## 2014-02-28 ENCOUNTER — Other Ambulatory Visit: Payer: Self-pay

## 2014-02-28 DIAGNOSIS — Z1231 Encounter for screening mammogram for malignant neoplasm of breast: Secondary | ICD-10-CM

## 2014-03-11 ENCOUNTER — Ambulatory Visit
Admission: RE | Admit: 2014-03-11 | Discharge: 2014-03-11 | Disposition: A | Discharge status: Home / Self Care | Payer: Medicare Other | Source: Ambulatory Visit

## 2014-03-11 DIAGNOSIS — Z1231 Encounter for screening mammogram for malignant neoplasm of breast: Secondary | ICD-10-CM

## 2014-03-14 ENCOUNTER — Encounter: Payer: Self-pay | Admitting: Emergency Medicine

## 2014-04-11 ENCOUNTER — Encounter: Payer: Self-pay | Admitting: Emergency Medicine

## 2014-04-29 ENCOUNTER — Telehealth: Payer: Self-pay | Admitting: Emergency Medicine

## 2014-04-29 MED ORDER — PIRFENIDONE 267 MG PO CAPS
ORAL_CAPSULE | ORAL | Status: DC
Start: 2014-04-29 — End: 2015-03-07

## 2014-04-29 NOTE — Telephone Encounter (Signed)
Spoke with pt. Aware RX has been sent in. Nothing further needed

## 2014-05-19 ENCOUNTER — Encounter (HOSPITAL_COMMUNITY): Payer: Self-pay | Admitting: Cardiology

## 2014-06-09 ENCOUNTER — Telehealth: Payer: Self-pay | Admitting: Emergency Medicine

## 2014-06-09 MED ORDER — AZITHROMYCIN 250 MG PO TABS: ORAL_TABLET | ORAL | Status: DC

## 2014-06-09 NOTE — Telephone Encounter (Signed)
Called and spoke to pt. Pt c/o dry cough, sinus congestion- blowing nose producing brown and clear mucus production, chest tightness, headache, sneezing and weakness x 6 days. Pt denies chest congestion, f/c/s. Pt stated she is taking dayquil and nyquil. Dr. Lamonte Sakai is unavailable today. Will forward to doc of day.  Dr. Annamaria Boots please advise.   Allergies  Allergen Reactions  . Dilaudid [Hydromorphone] Nausea And Vomiting    Current Outpatient Prescriptions on File Prior to Visit  Medication Sig Dispense Refill  . aspirin 81 MG tablet Take 81 mg by mouth daily.    . calcium-vitamin D (OSCAL WITH D) 500-200 MG-UNIT per tablet Take 1 tablet by mouth every morning.    . Cranberry 1000 MG CAPS Take 1 capsule by mouth daily.    . hydrochlorothiazide (HYDRODIURIL) 25 MG tablet Take 1 tablet (25 mg total) by mouth every morning.    [name] levothyroxine (SYNTHROID, LEVOTHROID) 100 MCG tablet Take 100 mcg by mouth every morning.     . loratadine (CLARITIN) 10 MG tablet Take 10 mg by mouth every evening.    . Multiple Vitamin (MULTIVITAMIN WITH MINERALS) TABS Take 1 tablet by mouth every morning. Centrum silver    . omeprazole (PRILOSEC OTC) 20 MG tablet Take 1 tablet (20 mg total) by mouth daily. 90 tablet 3  . Pirfenidone (ESBRIET) 267 MG CAPS TAKE THREE CAPSULES BY MOUTH THREE TIMES DAILY WITH FOOD. 270 capsule 4   No current facility-administered medications on file prior to visit.

## 2014-06-09 NOTE — Telephone Encounter (Signed)
Offer Zpak and suggest [place] Mucinex DM

## 2014-06-09 NOTE — Telephone Encounter (Signed)
Called, spoke with pt.  Discussed below recs per Dr .Annamaria Boots.  She is aware rx sent to [facility].  Pt is to call back if symptoms do not improve or worsen and is to seek emergency care if needed.  Pt verbalized understanding of instructions, is in agreement with plan, and voiced no further questions or concerns at this time.  Will sign off and route to RB as fyi.

## 2014-06-24 ENCOUNTER — Encounter: Payer: Self-pay | Admitting: Internal Medicine

## 2014-06-24 ENCOUNTER — Other Ambulatory Visit: Payer: Self-pay | Admitting: Internal Medicine

## 2014-06-24 ENCOUNTER — Encounter (INDEPENDENT_AMBULATORY_CARE_PROVIDER_SITE_OTHER): Payer: Self-pay | Admitting: Internal Medicine

## 2014-06-24 ENCOUNTER — Ambulatory Visit (INDEPENDENT_AMBULATORY_CARE_PROVIDER_SITE_OTHER): Payer: Medicare Other | Admitting: Internal Medicine

## 2014-06-24 VITALS — BP 118/64 | HR 92 | Temp 98.1°F | Resp 17 | Ht 62.5 in | Wt 146.0 lb

## 2014-06-24 DIAGNOSIS — J84112 Idiopathic pulmonary fibrosis: Secondary | ICD-10-CM

## 2014-06-24 DIAGNOSIS — Z006 Encounter for examination for normal comparison and control in clinical research program: Secondary | ICD-10-CM

## 2014-06-24 DIAGNOSIS — R0600 Dyspnea, unspecified: Secondary | ICD-10-CM

## 2014-06-24 NOTE — Progress Notes (Signed)
PFT done today. 

## 2014-06-24 NOTE — Patient Instructions (Addendum)
ICD-9-CM ICD-10-CM   1. Research study patient V70.7 Z00.6   2. IPF (idiopathic pulmonary fibrosis) 516.31 J84.112 EKG 12-Lead     EKG 12-Lead   Ifnormed consent Continue Esbriet - drug from is [employer] 5th, 2016 unless she screen fails  Next vsiti: feb 5th - after that visit she will start Dickey Gave provided she does not screen fail on feb 5th, 2016

## 2014-06-24 NOTE — Progress Notes (Signed)
Subjective:    Patient ID: Tina Calderon, female    DOB: Oct 06, 1940, 74 y.o.   MRN: [id number]  HPI   Clinical IPF patient  - On Esbreit since aug 2014   - Autoimmune panel 2013 and 2014 is negative and normal   - Pulmonary function test 02/15/2014 postbronchodilator FVC 1.55 L/  59%, total lung capacity 3.05 L/64%, DLCO 14.3/66%  - CT chest  May 2015 - NSIP per radioolgy    OV 06/24/2014 Chief Complaint  Patient presents with  . Research   Dr. Baltazar Apo patient with idiopathic pulmonary fibrosis as above. Is on 3 pills 3 times a day off Esbriet  full dose since August 2014. She is here because she wants to enroll into the pphase4 study which involves baseline Esbriet + addition of OFEV  . BOth meds if qualified will be supplied by study.  Today is screening and informed consent visit  - Patient is here with her husband. She is very clear that she wants to put dissipated research study. HER-2 siblings have died from idiopathic pulmonary fibrosis. The sent this is not a treatment protocol and she is signing up for research on her own free will. She meets criteria based on the clinical diagnosis of idiopathic pulmonary fibrosis and CT scan of the chest. Pulmonary function test continues to be in the moderate category which again makes a qualify. FVC today is 1.6 L/61%, FEV1 is 1.3 L/68% , ratio of 75/111%. DLCO is 13.65/63%. Overall pulmonary function test is stable compared to September 2015 did EKG is sinus rhythm. QTc interval is less than 500 msec  - There are no new issues. She does not have any GI or skin complaints with esbriet. Tolerance is fine  - Most recent liver function test 01/28/2014 are normal     has a past medical history of Thyroid disease; Hypertension; Prolapse of bladder; Pelvic relaxation; Hypothyroidism; Anginal pain; Shortness of breath; Cancer; CHF (congestive heart failure) (01/22/2012); and ILD (interstitial lung disease) (02/04/2012).   reports that she  has never smoked. She has never used smokeless tobacco.  Past Surgical History  Procedure Laterality Date  . Vaginal hysterectomy  15 years ago  . Bladder tact  15 years ago  . Tonsillectomy    . Neck surgery  2010  . Shoulder surgery    . Thyroid disease    . Anterior and posterior repair  12/05/2011    Procedure: ANTERIOR (CYSTOCELE) AND POSTERIOR REPAIR (RECTOCELE);  Surgeon: [name], MD;  Location: [facility];  Service: Gynecology;  Laterality: N/A;  with cysto  . Left heart catheterization with coronary angiogram N/A 01/22/2012    Procedure: LEFT HEART CATHETERIZATION WITH CORONARY ANGIOGRAM;  Surgeon: [name], MD;  Location: [facility] CATH LAB;  Service: Cardiovascular;  Laterality: N/A;    Allergies  Allergen Reactions  . Dilaudid [Hydromorphone] Nausea And Vomiting    Immunization History  Administered Date(s) Administered  . Influenza Split 02/22/2012, 03/01/2013, 02/08/2014  . Influenza Whole 04/11/2011  . Pneumococcal Conjugate-13 03/29/2014  . Pneumococcal-Unspecified 04/11/2011  . Td 08/30/2008  . Zoster 08/07/2011    Family History  Problem Relation Age of Onset  . Thyroid disease Mother   . Hypertension Mother   . Coronary artery disease Mother   . Aneurysm Father   . Cancer Brother     LIVER  . Stroke Other   . Sleep apnea Other   . Pulmonary fibrosis Brother   . Pulmonary fibrosis Sister  Current outpatient prescriptions:  .  aspirin 81 MG tablet, Take 81 mg by mouth daily., Disp: , Rfl:  .  calcium-vitamin D (OSCAL WITH D) 500-200 MG-UNIT per tablet, Take 1 tablet by mouth every morning., Disp: , Rfl:  .  Cranberry 1000 MG CAPS, Take 1 capsule by mouth daily., Disp: , Rfl:  .  hydrochlorothiazide (HYDRODIURIL) 25 MG tablet, Take 1 tablet (25 mg total) by mouth every morning., Disp: , Rfl:  .  levothyroxine (SYNTHROID, LEVOTHROID) 100 MCG tablet, Take 100 mcg by mouth every morning. , Disp: , Rfl:  .  Multiple Vitamin (MULTIVITAMIN WITH  MINERALS) TABS, Take 1 tablet by mouth every morning. Centrum silver, Disp: , Rfl:  .  omeprazole (PRILOSEC OTC) 20 MG tablet, Take 1 tablet (20 mg total) by mouth daily., Disp: 90 tablet, Rfl: 3 .  Pirfenidone (ESBRIET) 267 MG CAPS, TAKE THREE CAPSULES BY MOUTH THREE TIMES DAILY WITH FOOD., Disp: 270 capsule, Rfl: 4 .  azithromycin (ZITHROMAX Z-PAK) 250 MG tablet, Take as directed (Patient not taking: Reported on 06/24/2014), Disp: 6 each, Rfl: 0 .  loratadine (CLARITIN) 10 MG tablet, Take 10 mg by mouth every evening., Disp: , Rfl:     Review of Systems     Objective:   Physical Exam  Constitutional: She is oriented to person, place, and time. She appears well-developed and well-nourished. No distress.  HENT:  Head: Normocephalic and atraumatic.  Right Ear: External ear normal.  Left Ear: External ear normal.  Mouth/Throat: Oropharynx is clear and moist. No oropharyngeal exudate.  Eyes: Conjunctivae and EOM are normal. Pupils are equal, round, and reactive to light. Right eye exhibits no discharge. Left eye exhibits no discharge. No scleral icterus.  Neck: Normal range of motion. Neck supple. No JVD present. No tracheal deviation present. No thyromegaly present.  Cardiovascular: Normal rate, regular rhythm, normal heart sounds and intact distal pulses.  Exam reveals no gallop and no friction rub.   No murmur heard. Pulmonary/Chest: Effort normal. No respiratory distress. She has no wheezes. She has rales. She exhibits no tenderness.  Basal crackles +  Abdominal: Soft. Bowel sounds are normal. She exhibits no distension and no mass. There is no tenderness. There is no rebound and no guarding.  Musculoskeletal: Normal range of motion. She exhibits no edema or tenderness.  Lymphadenopathy:    She has no cervical adenopathy.  Neurological: She is alert and oriented to person, place, and time. She has normal reflexes. No cranial nerve deficit. She exhibits normal muscle tone. Coordination  normal.  Skin: Skin is warm and dry. No rash noted. She is not diaphoretic. No erythema. No pallor.  Psychiatric: She has a normal mood and affect. Her behavior is normal. Judgment and thought content normal.  Vitals reviewed.   Filed Vitals:   06/24/14 1307 06/24/14 1308  BP: 118/64 118/64  Pulse: 92 92  Temp: 98.1 F (36.7 C)   TempSrc: Oral   Resp: 17   Height: 5' 2.5" (1.588 m)   Weight: 146 lb (66.225 kg)   SpO2: 96% 96%          Assessment & Plan:     ICD-9-CM ICD-10-CM   1. Research study patient V70.7 Z00.6   2. IPF (idiopathic pulmonary fibrosis) 516.31 J84.112 EKG 12-Lead     EKG 12-Lead     Discussion  of Esbriet and Ofev held. Risks of side effects and potential benefit of additive/synergistic slowing dowin of IPF when taken in combo held  Both drugs  OFEV and Esbriet only slow down progression- this means extension in quality of life with possible mortality benefit but no difference in symptoms   Curent study as phase4 with primary intent of safety explained   - OFEV  - twice daily,   - no need for sunscreen but high chance of diarrhea and need lomotil , slight increase in heart attack risk and theoretical increase in bleeding risk,   - need blood work esp for liver toxicity monitoring - time to firse exacerbation possibly reduced in one trial but not in another    - ESBRIET  - 3 pill three times daily,  -  Need to wean sunscreen, small chance of nausea and anorexia but no diarrhea, no heart attack risk, no bleeding risk (currently no symptoms for Tina Calderon   - need blood work for LFT - possible mortality benefit in pooled analysis - larger world wide experience  Based on above she agrees to trial at her own free will    Ifnormed consent Continue Esbriet - drug from is [facility] FEb 5th, 2016 unless she screen fails  Next vsiti: feb 5th - after that visit she will start OFev provided she does not screen fail on  feb 5th, 2016   Dr. Brand Males, M.D., [facility].C.P Pulmonary and Critical Care Medicine Staff Physician [place] [facility] Pager: [phone], If no answer or between  15:00h - 7:00h: call [phone]  06/24/2014 2:29 PM

## 2014-06-28 DIAGNOSIS — Z006 Encounter for examination for normal comparison and control in clinical research program: Secondary | ICD-10-CM | POA: Insufficient documentation

## 2014-06-30 ENCOUNTER — Telehealth: Payer: Self-pay | Admitting: Internal Medicine

## 2014-06-30 NOTE — Telephone Encounter (Signed)
Per Hoyle Sauer, Nutritional therapist, pt will need to have mail order Esbriet held for 6 months d/t research program supplying Esbriet. Called and spoke to [place] at [facility] and requested the Esbriet to be held for 6 months. Apolonio Schneiders stated they will hold the medication and when it is time for pt to restart the Esbriet they will call her to assure she is ready for the new start. Pt aware. Nothing further needed.

## 2014-07-15 ENCOUNTER — Ambulatory Visit (INDEPENDENT_AMBULATORY_CARE_PROVIDER_SITE_OTHER): Payer: Medicare Other | Admitting: Internal Medicine

## 2014-07-15 ENCOUNTER — Encounter: Payer: Self-pay | Admitting: Internal Medicine

## 2014-07-15 VITALS — BP 126/88 | HR 86 | Temp 97.4°F | Resp 16 | Ht 62.5 in | Wt 142.0 lb

## 2014-07-15 DIAGNOSIS — Z006 Encounter for examination for normal comparison and control in clinical research program: Secondary | ICD-10-CM

## 2014-07-15 DIAGNOSIS — R5382 Chronic fatigue, unspecified: Secondary | ICD-10-CM | POA: Insufficient documentation

## 2014-07-15 DIAGNOSIS — J84112 Idiopathic pulmonary fibrosis: Secondary | ICD-10-CM

## 2014-07-15 DIAGNOSIS — R0600 Dyspnea, unspecified: Secondary | ICD-10-CM

## 2014-07-15 HISTORY — DX: Chronic fatigue, unspecified: R53.82

## 2014-07-15 LAB — PULMONARY FUNCTION TEST
DL/VA % pred: 108 %
DL/VA: 4.92 ml/min/mmHg/L
DLCO UNC: 13.65 ml/min/mmHg
DLCO unc % pred: 63 %
FEF 25-75 Post: 1.89 L/sec
FEF 25-75 Pre: 1.61 L/sec
FEF2575-%CHANGE-POST: 16 %
FEF2575-%Pred-Post: 120 %
FEF2575-%Pred-Pre: 102 %
FEV1-%Change-Post: 1 %
FEV1-%PRED-POST: 69 %
FEV1-%Pred-Pre: 68 %
FEV1-PRE: 1.33 L
FEV1-Post: 1.35 L
FEV1FVC-%Change-Post: 2 %
FEV1FVC-%PRED-PRE: 111 %
FEV6-%Change-Post: 0 %
FEV6-%PRED-PRE: 63 %
FEV6-%Pred-Post: 64 %
FEV6-PRE: 1.56 L
FEV6-Post: 1.58 L
FEV6FVC-%CHANGE-POST: 1 %
FEV6FVC-%PRED-PRE: 103 %
FEV6FVC-%Pred-Post: 105 %
FVC-%Change-Post: 0 %
FVC-%PRED-POST: 60 %
FVC-%Pred-Pre: 61 %
FVC-Post: 1.58 L
FVC-Pre: 1.59 L
POST FEV1/FVC RATIO: 85 %
PRE FEV6/FVC RATIO: 98 %
Post FEV6/FVC ratio: 100 %
Pre FEV1/FVC ratio: 84 %
RV % PRED: 61 %
RV: 1.34 L
TLC % PRED: 63 %
TLC: 3.01 L

## 2014-07-15 NOTE — Progress Notes (Signed)
Spirometry pre and post , DLCO done today. Research

## 2014-07-15 NOTE — Patient Instructions (Addendum)
ICD-9-CM ICD-10-CM   1. Research subject V70.7 Z00.6   2. IPF (idiopathic pulmonary fibrosis) 516.31 J84.112 EKG 12-Lead     EKG 12-Lead  3. Chronic fatigue 780.79 R53.82    Cotniue esbriet but change source from commercial to study drug Start OFev per prpotocol - escalating dose Remember all the side effects we discussed esp diarrhea, and sun burn - continue to apply sun screen FAtigue  - likely thyroid related.  Glad dose adjusted  Followup  - next visit per protocol  - we will monitor fatigue and see if this corrects with thyroid medication correction. IF does not improve, refer rehab

## 2014-07-15 NOTE — Progress Notes (Signed)
   Subjective:    Patient ID: Tina Calderon, female    DOB: [birth year], 74 y.o.   MRN: [id number]  HPI    OV 07/15/2014  Chief Complaint  Patient presents with  . Research    [id number]   For those on background Esbriet x >  6 monthswith FVC >/= 50%, DLCO >/= 30%  . A Phase 4 trial adding Ofev. Six months only. Mainly safety data collection wtih efficacy. This study will form the base for combining these 2 drugs in the future. Side effet concners are add-on liver and gi side effects + indepdent side effects of each drug. Benefit is potential additive effect in slowing down IPF but safety needs to be established first  This is a research visit. She had screening visit on 06/24/2014. Today's drug administration visit. She has been screen passed. She still taking, she'll Esbriet her today after starting on the drug administration protocol she will come off, commercial Esbriet. She will take study drug Esbriet along with Ofev, since last visit she feels well. There are no new problems compared to last visit. However, for the last 2 months she is reporting fatigue. She forgot to mention the fatigue at the time of screening. She notices the fatigue particularly after lunch and at home. She status post pulmonary rehabilitation a while back. She is currently not on any maintenance exercise therapy. She tells me that her thyroid medication has been adjusted today/yesterday. Otherwise no new issues. She is compliantly using her sunscreen. No GI side effects  Recent labs include spirometry function test from 07/15/2014 today shows postbronchodilator FVC of 1.57 L/60%, DLCO of 12.9/59%  EKG today 07/15/2014 shows a QTc interval of 448 ms. Sinus tachycardia with a heart rate 106. No change from previous EKG.  Pertinent labs from 07/05/2014 shows a normal liver function test, creatinine of 0.8 mg percent     Review of Systems      Objective:   Physical Exam  Filed Vitals:   07/15/14 1321 07/15/14  1322  BP: 126/88 126/88  Pulse: 86   Temp: 97.4 F (36.3 C)   TempSrc: Oral   Resp: 16   Height: 5' 2.5" (1.588 m)   Weight: 142 lb (64.411 kg)   SpO2: 96%     Exam sheet from the research protocol filled out. No new changes. Only basal crackles which is same      Assessment & Plan:     ICD-9-CM ICD-10-CM   1. Research subject V70.7 Z00.6   2. IPF (idiopathic pulmonary fibrosis) 516.31 J84.112 EKG 12-Lead     EKG 12-Lead  3. Chronic fatigue 780.79 R53.82    Cotniue esbriet but change source from commercial to study drug Start OFev per prpotocol - escalating dose Remember all the side effects we discussed esp diarrhea, and sun burn - continue to apply sun screen FAtigue  - likely thyroid related.  Glad dose adjusted  Followup  - next visit per protocol  - we will monitor fatigue and see if this corrects with thyroid medication correction. IF does not improve, refer rehab   Dr. Brand Males, M.D., [facility].C.P Pulmonary and Critical Care Medicine Staff Physician [place] [facility] Pager: [phone], If no answer or between  15:00h - 7:00h: call [phone]  07/15/2014 2:11 PM

## 2014-07-28 ENCOUNTER — Ambulatory Visit (INDEPENDENT_AMBULATORY_CARE_PROVIDER_SITE_OTHER): Payer: Medicare Other | Admitting: Internal Medicine

## 2014-07-28 DIAGNOSIS — Z006 Encounter for examination for normal comparison and control in clinical research program: Secondary | ICD-10-CM

## 2014-07-28 DIAGNOSIS — J84112 Idiopathic pulmonary fibrosis: Secondary | ICD-10-CM

## 2014-07-28 NOTE — Progress Notes (Signed)
RESEARCH SUBJECT IN [facility] Study Protocol number [phone] with Study- ClinicalTrials.gov Identifier: [id number] which is a clinical study tol evaluate the safety and tolerability of combination treatment of nintedanib and pirfenidone in participants with IPF. Eligible IPF participant (FVC >/= 50%, DLCO >/= 30%)  must have received pirfenidone for at least 16 weeks on a stable dose. Nintedanib will be added on Day 1 of the study as a combination treatment for IPF for 24 weeks   This visit 07/28/2014 is a research   Visit and is for purpose of follow up and is number V3 on PROTOCOL

## 2014-07-28 NOTE — Progress Notes (Signed)
Subjective:    Patient ID: Tina Calderon, female    DOB: 1941/01/31, 74 y.o.   MRN: [id number]  HPI  For those on background Esbriet x >  6 monthswith FVC >/= 50%, DLCO >/= 30%  . A Phase 4 trial adding Ofev. Six months only. Mainly safety data collection wtih efficacy. This study will form the base for combining these 2 drugs in the future. Side effet concners are add-on liver and gi side effects + indepdent side effects of each drug. Benefit is potential additive effect in slowing down IPF but safety needs to be established first  This is a research visit. 07/15/14 She had screening visit on 06/24/2014. Today's drug administration visit. She has been screen passed. She still taking, she'll Esbriet her today after starting on the drug administration protocol she will come off, commercial Esbriet. She will take study drug Esbriet along with Ofev, since last visit she feels well. There are no new problems compared to last visit. However, for the last 2 months she is reporting fatigue. She forgot to mention the fatigue at the time of screening. She notices the fatigue particularly after lunch and at home. She status post pulmonary rehabilitation a while back. She is currently not on any maintenance exercise therapy. She tells me that her thyroid medication has been adjusted today/yesterday. Otherwise no new issues. She is compliantly using her sunscreen. No GI side effects  Recent labs include spirometry function test from 07/15/2014 today shows postbronchodilator FVC of 1.57 L/60%, DLCO of 12.9/59%  EKG today 07/15/2014 shows a QTc interval of 448 ms. Sinus tachycardia with a heart rate 106. No change from previous EKG.  Pertinent labs from 07/05/2014 shows a normal liver function test, cr   OV 07/28/2014  RESEARCH SUBJECT IN [facility] Study Protocol number [id number] with Study- ClinicalTrials.gov Identifier: [id number] which is a clinical study tol evaluate the safety and tolerability of  combination treatment of nintedanib and pirfenidone in participants with IPF. Eligible IPF participant (FVC >/= 50%, DLCO >/= 30%) must have received pirfenidone for at least 16 weeks on a stable dose. Nintedanib will be added on Day 1 of the study as a combination treatment for IPF for 24 weeks. This visit 07/28/2014 is a research Visit and is for purpose of follow up and is WEEK 2, Day 14 on PROTOCOL  She is now on prifenidonet through research + started on add on Ofev/Nintednamib. This is QPYP9 . She started her ofev 120m once a day and after than n 07/17/14 had vomit x 1, again 07/19/14 vomit x 1, and then 07/21/14 - dry heaves/nausea. All mild. On 07/23/14 increased dose to 1031mbid of nientedanib and no problems since then. Applying sun screen. Having lab work today. No new issues. Feels baseline     Review of Systems   Baseline +ve dyspnea Had nauseaand vomit but now resolved    Objective:   Physical Exam  VITALS RR 18,Weight 192#, P 140/80, HR 52 - done by CMA Exam [positve for basal crackles - 1/3rd No other positive findings        Assessment & Plan:     ICD-9-CM ICD-10-CM   1. Research study patient V70.7 Z00.6   2. 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 Brand Males, M.D., [facility].C.P Pulmonary and Critical Care Medicine Staff Physician [place] [facility] Pager: [phone], If no answer or between  15:00h - 7:00h: call [phone]  07/28/2014 1:09 PM

## 2014-07-28 NOTE — Patient Instructions (Addendum)
ICD-9-CM ICD-10-CM   1. Research study patient V70.7 Z00.6   2. 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

## 2014-08-03 DIAGNOSIS — Z006 Encounter for examination for normal comparison and control in clinical research program: Secondary | ICD-10-CM | POA: Insufficient documentation

## 2014-09-13 ENCOUNTER — Ambulatory Visit (INDEPENDENT_AMBULATORY_CARE_PROVIDER_SITE_OTHER): Payer: Medicare Other | Admitting: Adult Health

## 2014-09-13 ENCOUNTER — Encounter: Payer: Self-pay | Admitting: Adult Health

## 2014-09-13 VITALS — BP 126/80 | HR 81 | Resp 17 | Wt 149.0 lb

## 2014-09-13 DIAGNOSIS — Z006 Encounter for examination for normal comparison and control in clinical research program: Secondary | ICD-10-CM

## 2014-09-13 NOTE — Assessment & Plan Note (Signed)
RESEARCH SUBJECT IN [facility] Study Protocol number [phone] with Study- ClinicalTrials.gov Identifier: [id number] which is a clinical study tol evaluate the safety and tolerability of combination treatment of nintedanib and pirfenidone in participants with IPF. Eligible IPF participant (FVC >/= 50%, DLCO >/= 30%) must have received pirfenidone for at least 16 weeks on a stable dose. Nintedanib will be added on Day 1 of the study as a combination treatment for IPF for 24 weeks   This visit 09/13/2014 is a research follow up Visit and is number 6 on PROTOCOL.   Appears to be doing well in study .  Nausea appears to be tolerable and is improving.  Cont w/ research protocol.

## 2014-09-13 NOTE — Progress Notes (Signed)
   Subjective:    Patient ID: Tina Calderon, female    DOB: 03/25/1941, 74 y.o.   MRN: [id number]  HPI RESEARCH SUBJECT IN [facility] Study Protocol number [id number] with Study- ClinicalTrials.gov Identifier: [id number] which is a clinical study tol evaluate the safety and tolerability of combination treatment of nintedanib and pirfenidone in participants with IPF. Eligible IPF participant (FVC >/= 50%, DLCO >/= 30%) must have received pirfenidone for at least 16 weeks on a stable dose. Nintedanib will be added on Day 1 of the study as a combination treatment for IPF for 24 weeks   This visit 09/13/2014 is a research follow up Visit and is number 6 on PROTOCOL.   Pt says overall she is doing well without flare of cough or dyspnea.  No change in activity tolerance.  Does have nausea that is most days although seems to be decreasing in frequency and severity .  Did begin when she began study .  No weight loss , bloody stools or diarrhea.  Did vomit x 1 few weeks ago, no recent vomiting.      Review of Systems See hpi     Objective:   Physical Exam  GEN: A/Ox3; pleasant , NAD, elderly   HEENT:  [place]/AT,  EACs-clear, TMs-wnl, NOSE-clear, THROAT-clear, no lesions, no postnasal drip or exudate noted.   NECK:  Supple w/ fair ROM; no JVD; normal carotid impulses w/o bruits; no thyromegaly or nodules palpated; no lymphadenopathy.  RESP  Faint bibasilar crackles , no accessory muscle use, no dullness to percussion  CARD:  RRR, no m/r/g  , no peripheral edema, pulses intact, no cyanosis or clubbing.  GI:   Soft & nt; nml bowel sounds; no organomegaly or masses detected.  Musco: Warm bil, no deformities or joint swelling noted.   Neuro: alert, no focal deficits noted.    Skin: Warm, no lesions or rashes        Assessment & Plan:

## 2014-09-13 NOTE — Progress Notes (Signed)
RESEARCH SUBJECT IN [facility] Study Protocol number [phone] with Study- ClinicalTrials.gov Identifier: [id number] which is a clinical study tol evaluate the safety and tolerability of combination treatment of nintedanib and pirfenidone in participants with IPF. Eligible IPF participant (FVC >/= 50%, DLCO >/= 30%)  must have received pirfenidone for at least 16 weeks on a stable dose. Nintedanib will be added on Day 1 of the study as a combination treatment for IPF for 24 weeks   This visit 09/13/2014 is a research follow up Visit  and is number 6 on PROTOCOL.  Subject was dispensed study drug on this day.

## 2014-09-13 NOTE — Patient Instructions (Signed)
Continue with research protocol.

## 2014-10-11 ENCOUNTER — Ambulatory Visit (INDEPENDENT_AMBULATORY_CARE_PROVIDER_SITE_OTHER): Payer: Medicare Other | Admitting: Adult Health

## 2014-10-11 VITALS — BP 124/76 | HR 83 | Temp 98.1°F | Resp 17 | Wt 142.0 lb

## 2014-10-11 DIAGNOSIS — Z006 Encounter for examination for normal comparison and control in clinical research program: Secondary | ICD-10-CM

## 2014-10-11 DIAGNOSIS — J84112 Idiopathic pulmonary fibrosis: Secondary | ICD-10-CM

## 2014-10-11 NOTE — Progress Notes (Signed)
[id number] study: RESEARCH SUBJECT IN [facility] Study Protocol- ClinicalTrials.gov Identifier: [id number] which is a clinical study tol evaluate the safety and tolerability of combination treatment of nintedanib and pirfenidone in participants with IPF. Eligible IPF participant (FVC >/= 50%, DLCO >/= 30%)  must have received pirfenidone for at least 16 weeks on a stable dose. Nintedanib will be added on Day 1 of the study as a combination treatment for IPF for 24 weeks   This visit 10/11/2014  for Tina Calderon with 1941/05/05 who is Subject Number [id number]  is a research Visit and is for purpose of follow up and is number Week 12 Day 84 on PROTOCOL.

## 2014-10-11 NOTE — Patient Instructions (Signed)
Continue with research protocol.  

## 2014-10-17 NOTE — Progress Notes (Signed)
   Subjective:    Patient ID: Tina Calderon, female    DOB: 1941/04/18, 74 y.o.   MRN: [id number]  HPI [id number] study: RESEARCH SUBJECT IN [facility] Study Protocol- ClinicalTrials.gov Identifier: [id number] which is a clinical study tol evaluate the safety and tolerability of combination treatment of nintedanib and pirfenidone in participants with IPF. Eligible IPF participant (FVC >/= 50%, DLCO >/= 30%) must have received pirfenidone for at least 16 weeks on a stable dose. Nintedanib will be added on Day 1 of the study as a combination treatment for IPF for 24 weeks   This visit 10/11/2014 for Tina Calderon Going with 1941-02-07 who is Subject Number [id number] is a research Visit and is for purpose of follow up and is number Week 12 Day 84 on PROTOCOL.   Doing well in study , occasional AE of nausea. Keeping diary.    Review of Systems See HPI  No change in baseline dyspnea.     Objective:   Physical Exam GEN: A/Ox3; pleasant , NAD, elderly   HEENT:  [place]/AT,  EACs-clear, TMs-wnl, NOSE-clear, THROAT-clear, no lesions, no postnasal drip or exudate noted.   NECK:  Supple w/ fair ROM; no JVD; normal carotid impulses w/o bruits; no thyromegaly or nodules palpated; no lymphadenopathy.  RESP  Faint basilar crackles .no accessory muscle use, no dullness to percussion  CARD:  RRR, no m/r/g  , no peripheral edema, pulses intact, no cyanosis or clubbing.  GI:   Soft & nt; nml bowel sounds; no organomegaly or masses detected.  Musco: Warm bil, no deformities or joint swelling noted.   Neuro: alert, no focal deficits noted.    Skin: Warm, no lesions or rashes         Assessment & Plan:

## 2014-10-17 NOTE — Assessment & Plan Note (Signed)
Stable without flare   

## 2014-10-17 NOTE — Assessment & Plan Note (Signed)
Stable in study  AE of nausea that is mild .  Cont w/ diary and planned follow up .

## 2014-10-31 ENCOUNTER — Ambulatory Visit (INDEPENDENT_AMBULATORY_CARE_PROVIDER_SITE_OTHER): Payer: Medicare Other | Admitting: Adult Health

## 2014-10-31 VITALS — BP 110/70 | HR 98 | Temp 97.8°F | Resp 17 | Wt 142.0 lb

## 2014-10-31 DIAGNOSIS — J84112 Idiopathic pulmonary fibrosis: Secondary | ICD-10-CM

## 2014-10-31 DIAGNOSIS — Z006 Encounter for examination for normal comparison and control in clinical research program: Secondary | ICD-10-CM

## 2014-10-31 NOTE — Progress Notes (Signed)
[id number] study: RESEARCH SUBJECT IN [facility] Study Protocol- ClinicalTrials.gov Identifier: [id number] which is a clinical study tol evaluate the safety and tolerability of combination treatment of nintedanib and pirfenidone in participants with IPF. Eligible IPF participant (FVC >/= 50%, DLCO >/= 30%)  must have received pirfenidone for at least 16 weeks on a stable dose. Nintedanib will be added on Day 1 of the study as a combination treatment for IPF for 24 weeks   This visit 10/31/2014  for Tina Calderon with [birth year] who is Subject Number [id number]  is a research Visit and is for purpose of follow up and is number week 16 on PROTOCOL.  Subject is doing well, still having some nausea, has been captured as AE.  Gave study drug and will have labs drawn today.

## 2014-11-02 NOTE — Progress Notes (Signed)
   Subjective:    Patient ID: Tina Calderon, female    DOB: July 27, 1940, 74 y.o.   MRN: [id number]  HPI [id number] study: RESEARCH SUBJECT IN Hoffman-Roche Study Protocol- ClinicalTrials.gov Identifier: [id number] which is a clinical study tol evaluate the safety and tolerability of combination treatment of nintedanib and pirfenidone in participants with IPF. Eligible IPF participant (FVC >/= 50%, DLCO >/= 30%) must have received pirfenidone for at least 16 weeks on a stable dose. Nintedanib will be added on Day 1 of the study as a combination treatment for IPF for 24 weeks   This visit 10/31/2014 for Tina Calderon with [birth year] who is Subject Number [id number] is a research Visit and is for purpose of follow up and is number week 16 on PROTOCOL.   Subject is doing well, still having some nausea.no vomiting .  Breathing is stable without flare of cough or wheezing.   Review of Systems       Objective:   Physical Exam GEN: A/Ox3; pleasant , NAD, elderly   HEENT:  [place]/AT,  EACs-clear, TMs-wnl, NOSE-clear, THROAT-clear, no lesions, no postnasal drip or exudate noted.   NECK:  Supple w/ fair ROM; no JVD; normal carotid impulses w/o bruits; no thyromegaly or nodules palpated; no lymphadenopathy.  RESP  Bibasilar crackles , no accessory muscle use, no dullness to percussion  CARD:  RRR, no m/r/g  , no peripheral edema, pulses intact, no cyanosis or clubbing.  GI:   Soft & nt; nml bowel sounds; no organomegaly or masses detected.  Musco: Warm bil, no deformities or joint swelling noted.   Neuro: alert, no focal deficits noted.    Skin: Warm, no lesions or rashes         Assessment & Plan:

## 2014-11-02 NOTE — Patient Instructions (Signed)
Continue with research protocol .

## 2014-11-03 ENCOUNTER — Encounter: Payer: Medicare Other | Admitting: Internal Medicine

## 2014-11-03 NOTE — Assessment & Plan Note (Signed)
Stable without flare   

## 2014-11-03 NOTE — Assessment & Plan Note (Signed)
Doing well in study .  Cont protocol

## 2014-11-10 ENCOUNTER — Encounter: Payer: Medicare Other | Admitting: Adult Health

## 2014-12-05 ENCOUNTER — Encounter: Payer: Self-pay | Admitting: Critical Care Medicine

## 2014-12-05 ENCOUNTER — Ambulatory Visit (INDEPENDENT_AMBULATORY_CARE_PROVIDER_SITE_OTHER): Payer: Medicare Other | Admitting: Critical Care Medicine

## 2014-12-05 VITALS — BP 130/80 | HR 93 | Temp 97.6°F | Resp 17 | Wt 140.0 lb

## 2014-12-05 DIAGNOSIS — J84112 Idiopathic pulmonary fibrosis: Secondary | ICD-10-CM

## 2014-12-05 NOTE — Progress Notes (Signed)
[id number] study: RESEARCH SUBJECT IN Hoffman-Roche Study Protocol- ClinicalTrials.gov Identifier: [id number] which is a clinical study tol evaluate the safety and tolerability of combination treatment of nintedanib and pirfenidone in participants with IPF. Eligible IPF participant (FVC >/= 50%, DLCO >/= 30%)  must have received pirfenidone for at least 16 weeks on a stable dose. Nintedanib will be added on Day 1 of the study as a combination treatment for IPF for 24 weeks   This visit 12/05/2014  for Tina Calderon with 04-06-1941 who is Subject Number [id number]  is a research  Visit and is for purpose of  follow up and is week 20 on PROTOCOL.  Subject is doing well, has had 1 day of feeling ill from study drug where she actually vomited.  Is to return for final visit on 01/03/2015, Full PFT with DLCO will be done on that day.  Pt in for MA study combination study, finish date is Jul 26th. Since in the trial, dyspnea is unchanged.  No diarrhea is not noted.  Nausea comes and goes.  When on Esbriet alone did not note much nausea.  Not much cough is noted.  No other new issues but some fatigue.   Filed Vitals:   12/05/14 0937  BP: 130/80  Pulse: 93  Temp: 97.6 F (36.4 C)  TempSrc: Oral  Resp: 17  Weight: 140 lb (63.504 kg)  SpO2: 97%    Gen: Pleasant, well-nourished, in no distress,  normal affect  ENT: No lesions,  mouth clear,  oropharynx clear, no postnasal drip  Neck: No JVD, no TMG, no carotid bruits  Lungs: No use of accessory muscles, no dullness to percussion,bibasilar dry rales  Cardiovascular: RRR, heart sounds normal, no murmur or gallops, no peripheral edema  Abdomen: soft and NT, no HSM,  BS normal  Musculoskeletal: No deformities, no cyanosis or clubbing  Neuro: alert, non focal  Skin: Warm, no lesions or rashes  No results found.   Impression: #1 IPF in MA trial combo of pirfenidone/nintedanib  Pt tolerating well. Minimal nausea only  Plan Continue to final visit  scheduled for 01/03/15.  Tina Pilar WrightMD

## 2014-12-18 ENCOUNTER — Other Ambulatory Visit: Payer: Self-pay | Admitting: Emergency Medicine

## 2015-01-03 ENCOUNTER — Other Ambulatory Visit: Payer: Self-pay | Admitting: Internal Medicine

## 2015-01-03 ENCOUNTER — Telehealth: Payer: Self-pay | Admitting: Internal Medicine

## 2015-01-03 ENCOUNTER — Ambulatory Visit (INDEPENDENT_AMBULATORY_CARE_PROVIDER_SITE_OTHER): Payer: Medicare Other | Admitting: Internal Medicine

## 2015-01-03 VITALS — BP 138/80 | HR 74 | Temp 97.6°F | Resp 17 | Wt 140.8 lb

## 2015-01-03 DIAGNOSIS — R0600 Dyspnea, unspecified: Secondary | ICD-10-CM

## 2015-01-03 DIAGNOSIS — Z006 Encounter for examination for normal comparison and control in clinical research program: Secondary | ICD-10-CM

## 2015-01-03 DIAGNOSIS — J84112 Idiopathic pulmonary fibrosis: Secondary | ICD-10-CM

## 2015-01-03 DIAGNOSIS — R05 Cough: Secondary | ICD-10-CM

## 2015-01-03 DIAGNOSIS — R059 Cough, unspecified: Secondary | ICD-10-CM

## 2015-01-03 LAB — PULMONARY FUNCTION TEST
DL/VA % PRED: 110 %
DL/VA: 5 ml/min/mmHg/L
DLCO unc % pred: 63 %
DLCO unc: 13.66 ml/min/mmHg
FEF 25-75 POST: 1.54 L/s
FEF 25-75 Pre: 1.59 L/sec
FEF2575-%Change-Post: -3 %
FEF2575-%PRED-POST: 99 %
FEF2575-%Pred-Pre: 103 %
FEV1-%CHANGE-POST: 2 %
FEV1-%PRED-POST: 67 %
FEV1-%PRED-PRE: 65 %
FEV1-Post: 1.29 L
FEV1-Pre: 1.26 L
FEV1FVC-%CHANGE-POST: 4 %
FEV1FVC-%Pred-Pre: 110 %
FEV6-%Change-Post: -1 %
FEV6-%Pred-Post: 61 %
FEV6-%Pred-Pre: 61 %
FEV6-Post: 1.49 L
FEV6-Pre: 1.51 L
FEV6FVC-%Change-Post: 0 %
FEV6FVC-%Pred-Post: 105 %
FEV6FVC-%Pred-Pre: 105 %
FVC-%Change-Post: -1 %
FVC-%PRED-POST: 57 %
FVC-%PRED-PRE: 59 %
FVC-PRE: 1.52 L
FVC-Post: 1.49 L
POST FEV1/FVC RATIO: 86 %
POST FEV6/FVC RATIO: 100 %
PRE FEV6/FVC RATIO: 100 %
Pre FEV1/FVC ratio: 83 %

## 2015-01-03 NOTE — Progress Notes (Signed)
PFT done today.

## 2015-01-03 NOTE — Patient Instructions (Addendum)
ICD-9-CM ICD-10-CM   1. Research subject V70.7 Z00.6 EKG 12-Lead  2. 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 Cough 786.2 R05    Unclear if PFT is stable or mildy decline. If decline is in order of 4%. CHange in PFT could be related to heat related mild worsening cough that you have currently  Blood work 12/05/14 was ok  PLAN No more study drug Nintedanib (OFev) Continue baseline Pirfenidone (esbriet) 3 pills three times daily as beofre REturn study tablets 01/04/15   Followup  - Sub-I end of study visit 02/07/15 - Dr Lamonte Sakai your pulmonlogist SEpt/Oct 2016  - he will need to monitor you for disease progression/stability and safety on pirfenidone

## 2015-01-03 NOTE — Progress Notes (Signed)
[id number] study: RESEARCH SUBJECT IN Hoffman-Roche Study Protocol- ClinicalTrials.gov Identifier: [id number] which is a clinical study tol evaluate the safety and tolerability of combination treatment of nintedanib and pirfenidone in participants with IPF. Eligible IPF participant (FVC >/= 50%, DLCO >/= 30%)  must have received pirfenidone for at least 16 weeks on a stable dose. Nintedanib will be added on Day 1 of the study as a combination treatment for IPF for 24 weeks   This visit 01/03/2015  for Tina Calderon with 06-21-1940 who is Subject Number [id number]  is a research Visit and is for purpose of End of Treatment and is week 24 on PROTOCOL.  Subject is doing well, nervous about PFT's being done for today but overall doing ok.  [name] has began the process of reinstating her Esbriet through her grant program.  She will follow up for end of study Feb 07, 2015 with Tammy Parrett.    S:  Last day of study drug was yesterday he did. She took her study Esbriet baseline drug at usual normal full dose. However 4 yesterday and the day before which is 12/31/2004 seen in 01/02/2015 she accidentally took lower dose of study drug Ofev at 100 mg twice daily instead of 150 mg twice daily. This was an inadvertent mistake. Other than that overall since the beginning of the study she feels no different. Dyspnea stable. It is mild with exertion. Relieved by rest. She is not reporting any side effects. Specifically asked about GI side effects skin side effects weight loss and she denies any such side effect.  Last 2 weeks is reporting mild increase in baseline cough due to the heat and humidity   She did not brring her remaning study drugs with her 01/03/2015   O:  Filed Vitals:   01/03/15 1017  BP: 138/80  Pulse: 74  Temp: 97.6 F (36.4 C)  TempSrc: Oral  Resp: 17  Weight: 140 lb 12.8 oz (63.866 kg)  SpO2: 98%    Exam done and documented in the recent chart. Positive only for basal crackles which is  unchanged from baseline  Labs 12/05/2014  - Reviewed and these were without any significant change and clinically not significant  Labs today 01/03/2015  - Done and results pending  EKG today 01/03/2015 - Sinus rhythm poor R-wave progression. Unchanged from January and figure 2016 and baseline  Spirometry today 01/03/2015 - FVC prebronchodilator is 1.52 L/59%. This represents a 4.4% decrease compared to 07/15/2014 and 02/15/2014. However the DLCO appears unchanged compared to February 2016   A   ICD-9-CM ICD-10-CM   1. Research subject V70.7 Z00.6 EKG 12-Lead  2. 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 Cough 786.2 R05      Unclear if PFT is stable or mildy decline. If decline is in order of 4%  CHange in PFT could be related to heat related mild worsening cough that you have currently Report cough as AE to sponsor - not drug related . Due to heat   Blood work 12/05/14 was ok  PLAN No more study drug Nintedanib (OFev) Continue baseline Pirfenidone (esbriet) 3 pills three times daily as beofre REturn study tablets 01/04/15   Followup  - Sub-I end of study visit 02/07/15 - Dr Lamonte Sakai your pulmonlogist SEpt/Oct 2016  - he will need to monitor you for disease progression/stability and safety on pirfenidone   Dr. Brand Males, M.D., [facility].C.P Pulmonary and Critical Care Medicine Staff Physician [facility] Pager: [phone], If no answer or between  15:00h - 7:00h: call [phone]  01/03/2015 10:38 AM

## 2015-01-04 NOTE — Telephone Encounter (Signed)
Signed

## 2015-01-04 NOTE — Telephone Encounter (Signed)
Form was faxed in and placed in the blue accordion file in triage.

## 2015-01-04 NOTE — Telephone Encounter (Signed)
PA form received for Esbriet 260m.  Will give to RB to complete, sign and date.  This needs to be [facility] back [phone]

## 2015-01-06 ENCOUNTER — Telehealth: Payer: Self-pay | Admitting: Internal Medicine

## 2015-01-06 NOTE — Telephone Encounter (Signed)
Pt returned call  [phone]

## 2015-01-06 NOTE — Telephone Encounter (Signed)
Patient is requesting a form that she needs faxed and to pick up for a grant.  I advised her notes indicate a form was faxed on Wed 7/27 but she is not sure this is the same form she is talking about.  Patient can be reached at [phone].

## 2015-01-06 NOTE — Telephone Encounter (Signed)
Spoke with pt, states that per [employer], was told that our office needs to fill out and fax a Statement of Medical Necessity to their company.   Pt will be out of medication on Tuesday.   Spoke with [name], states that a PA is already in process for this medication.   Sending this note to Concepcion at her request

## 2015-01-06 NOTE — Telephone Encounter (Signed)
LMTCB

## 2015-01-06 NOTE — Telephone Encounter (Signed)
Pt states calling to ensure esbriet form was faxed to [employer]  I advised that this was done on 01/04/15 She verbalized understanding and nothing further needed

## 2015-01-06 NOTE — Telephone Encounter (Signed)
Called and spoke to [employer] and was advised they need a new SMN form. Form is to be faxed to front and faxed back. Pt aware we will call once we get an update on PA.   Will hold in my box to follow.

## 2015-01-09 NOTE — Telephone Encounter (Signed)
Case # listed below is incorrect.  Actual case #: [id number]  States that SMN is needed.  I advised that this was faxed on Friday afternoon per Elise's below documentation.    Thedora Hinders is requesting that this is re-faxed with case # written on it.    Forwarding to [place] to refax this

## 2015-01-09 NOTE — Telephone Encounter (Signed)
[name]  [phone] Case # [id number]  Wanting to know the status of this.

## 2015-01-09 NOTE — Telephone Encounter (Signed)
Received approval for Esbriet Cap 253m through [employer] Approved until 01/06/16.  Ref # [id number] Pt ID# [id number]  Informed pt of approval. She is asking if the grant was approved to help her pay for it. Phamacy is [employer] ([phone]. Pt states her case # is [id number]   EDaneil Dan  Pt is asking if you have heard anything on the grant and to please call her when you do. Thanks.

## 2015-01-10 NOTE — Telephone Encounter (Addendum)
lmtcb for Joann to refax SMN as I have not received this. Faxed the new start SMN form that is in office to expedite issue. Called and made pt aware.

## 2015-01-11 NOTE — Telephone Encounter (Signed)
I have received the fax that has patient incomplete info needed and placed it in MR folder up front for Tina Calderon, will route message back to [employer]

## 2015-01-11 NOTE — Telephone Encounter (Signed)
Received call from Legrand Como at [employer]. Legrand Como informed me to disregard the fax as the PA has been approved and the [employer] is helping pt with copay. Pt's Esbriet is being shipped out to pt's home today (8.3.16). Pt aware. Nothing further needed.

## 2015-01-11 NOTE — Telephone Encounter (Signed)
Legrand Como cb to inform us that they received forms Tina Calderon, pt did not fill out forms correctly and he will be faxing over missing info for Tina Calderon to get from patient [phone] [facility] [employer]

## 2015-01-11 NOTE — Telephone Encounter (Signed)
Forwarding to [place] to look out for

## 2015-01-11 NOTE — Telephone Encounter (Signed)
Tina Calderon - have you received this fax?   Please advise.

## 2015-01-15 ENCOUNTER — Encounter: Payer: Self-pay | Admitting: Internal Medicine

## 2015-02-07 ENCOUNTER — Ambulatory Visit (INDEPENDENT_AMBULATORY_CARE_PROVIDER_SITE_OTHER): Payer: Medicare Other | Admitting: Adult Health

## 2015-02-07 ENCOUNTER — Encounter: Payer: Self-pay | Admitting: Adult Health

## 2015-02-07 VITALS — BP 142/86 | HR 82 | Temp 98.2°F | Resp 16 | Wt 141.0 lb

## 2015-02-07 DIAGNOSIS — J84112 Idiopathic pulmonary fibrosis: Secondary | ICD-10-CM

## 2015-02-07 DIAGNOSIS — Z006 Encounter for examination for normal comparison and control in clinical research program: Secondary | ICD-10-CM

## 2015-02-07 NOTE — Assessment & Plan Note (Signed)
Compensated on present regimen  follow up with Dr. Lamonte Sakai  As planned

## 2015-02-07 NOTE — Patient Instructions (Signed)
Follow up with Dr. Lamonte Sakai  As planned next week

## 2015-02-07 NOTE — Progress Notes (Signed)
   Subjective:    Patient ID: [name], female    DOB: 1940/10/11, 74 y.o.   MRN: [id number]  HPI  [id number] study: RESEARCH SUBJECT IN Hoffman-Roche Study Protocol- ClinicalTrials.gov Identifier: [id number] which is a clinical study tol evaluate the safety and tolerability of combination treatment of nintedanib and pirfenidone in participants with IPF. Eligible IPF participant (FVC >/= 50%, DLCO >/= 30%) must have received pirfenidone for at least 16 weeks on a stable dose. Nintedanib will be added on Day 1 of the study as a combination treatment for IPF for 24 weeks   This visit 02/07/2015 for XIN KLAWITTER with Mar 30, 1941 who is Subject Number [id number] is a research Visit and is for purpose of End of Study and is number 9 on PROTOCOL.     Subject says she is doing well overall . Does feel she is slowly getting worse over last 3 years.  Gets tired easily . Breathing is stable without flare of cough or wheezing.  Since stopping study drug has restarted Esbriet 3 caps Three times a day     Review of Systems  see HPI      Objective:   Physical Exam  GEN: A/Ox3; pleasant , NAD, elderly   HEENT:  [place]/AT,   NECK:  Supple w/ fair ROM; no JVD; ; no lymphadenopathy.  RESP  Bibasilar crackles , no accessory muscle use, no dullness to percussion  CARD:  RRR, no m/r/g  , no peripheral edema, pulses intact, no cyanosis or clubbing.  GI:   Soft & nt;    Musco: Warm bil, no deformities or joint swelling noted.   Neuro: alert, no focal deficits noted.    Skin: Warm, no lesions or rashes         Assessment & Plan:

## 2015-02-07 NOTE — Progress Notes (Signed)
[id number] study: RESEARCH SUBJECT IN Hoffman-Roche Study Protocol- ClinicalTrials.gov Identifier: [id number] which is a clinical study tol evaluate the safety and tolerability of combination treatment of nintedanib and pirfenidone in participants with IPF. Eligible IPF participant (FVC >/= 50%, DLCO >/= 30%)  must have received pirfenidone for at least 16 weeks on a stable dose. Nintedanib will be added on Day 1 of the study as a combination treatment for IPF for 24 weeks   This visit 02/07/2015  for MARCEL SORTER with [birth year] who is Subject Number [id number]  is a research  Visit and is for purpose of End of Study and is number 9 on PROTOCOL.  Subject is doing well since stopping study drug, has no new complaints, she did return her diary.  She is scheduled to see Dr. Lamonte Sakai on Sept 6th to resume her care with him.  All procedures completed per study.

## 2015-02-07 NOTE — Assessment & Plan Note (Signed)
[id number] study: RESEARCH SUBJECT IN Hoffman-Roche Study Protocol- ClinicalTrials.gov Identifier: [id number] which is a clinical study tol evaluate the safety and tolerability of combination treatment of nintedanib and pirfenidone in participants with IPF. Eligible IPF participant (FVC >/= 50%, DLCO >/= 30%) must have received pirfenidone for at least 16 weeks on a stable dose. Nintedanib will be added on Day 1 of the study as a combination treatment for IPF for 24 weeks  This visit 02/07/2015 for Tina Calderon with 1941/02/25 who is Subject Number [id number] is a research Visit and is for purpose of End of Study and is number 9 on PROTOCOL.   She is doing okay since stopping study drug , has restarted prev esbriet.tid  follow up with Dr. Lamonte Sakai  Next week as planned

## 2015-02-08 ENCOUNTER — Other Ambulatory Visit: Payer: Self-pay | Admitting: Internal Medicine

## 2015-02-08 DIAGNOSIS — Z006 Encounter for examination for normal comparison and control in clinical research program: Secondary | ICD-10-CM

## 2015-02-08 DIAGNOSIS — J84112 Idiopathic pulmonary fibrosis: Secondary | ICD-10-CM

## 2015-02-14 ENCOUNTER — Encounter: Payer: Self-pay | Admitting: Emergency Medicine

## 2015-02-14 ENCOUNTER — Ambulatory Visit (INDEPENDENT_AMBULATORY_CARE_PROVIDER_SITE_OTHER): Payer: Medicare Other | Admitting: Emergency Medicine

## 2015-02-14 VITALS — BP 120/86 | HR 95 | Ht 62.0 in | Wt 141.2 lb

## 2015-02-14 DIAGNOSIS — J84112 Idiopathic pulmonary fibrosis: Secondary | ICD-10-CM | POA: Diagnosis not present

## 2015-02-14 MED ORDER — ALBUTEROL SULFATE HFA 108 (90 BASE) MCG/ACT IN AERS: 2.0000 | INHALATION_SPRAY | Freq: Four times a day (QID) | RESPIRATORY_TRACT | Status: DC | PRN

## 2015-02-14 NOTE — Assessment & Plan Note (Addendum)
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 She completed her clinical trial of dual antibiotic therapy. She is clinically unchanged. We will continue her current regimen and plan to repeat her CT scan of the chest and her pulmonary function testing in May 2017 to look for stability or interval change

## 2015-02-14 NOTE — Progress Notes (Signed)
Subjective:    Patient ID: Tina Calderon, female    DOB: 1940-12-28, 74 y.o.   MRN: [id number]  HPI 74 yo female never smoker seen for initial pulmonary consult during hospital for acute respiratory failure   [facility] follow up 01/30/12 -  Pt was admitted 2 weeks ago for chest pain and dyspnea.  Found to have Acute respiratory failure with hypoxia--  Tx for possible diastolic CHF with aggressive diuresis Echo showed EF 60% , no mention of diastolic dysfxn.  HRCT consistent with ILD-  CCM consult with autoimmune workup that was unrevealing with neg ANA, ANCA, RA factor , nml ESR , CRP    Does light housework but not able walk long distances due to dyspnea  FH of pulmonary fibrosis -sibling -they were smokers. NO significant desats with walking in cardiology yesterday, desated to 93% on room air.  No cough or wheezing  No edema.   Since discharge she is feeling better with less DOE. But does get winded with walking long distances.   ROV 02/21/12 -- follows up for ILD noted during hospitalization for resp failure. Treated for possible diastolic CHF. CT scan shows some mild basilar fibrotic change. PFT's 9/3 >> probable mixed disease, no BD response, restricted volumes, decrease DLCO that corrects for [place].  She had family hx of ILD.  She had walking oximetry that she says was reassuring. She feels no better today than at hospital discharge.   ROV 03/24/12 -- ILD without clear etiology, mixed dz on PFT 02/11/12. Initiated a trial prednisone last month >> She doesn't believe that her dyspnea has changed.  Last time she did not desat.   ROV 06/23/12 -- ILD without clear etiology, mixed dz on PFT 02/11/12. Trial of pred >> weaned to off. Sent her to pulm rehab at [place], feels that it has benefited her. She isn't as SOB as before. CXR today >> stable bibasilar ILD, hiatal hernia.   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 Also possible component diastolic dysfxn. She has a family hx of ILD. She tells me that her dyspnea is worse compared with last year. She is having some spells of near syncope on exertion. Walked today to exhaustion without desaturation.   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 She has been part of the pirfenidone trial and has been on the medication. She will be transitioning to the commercial drug. Denies any side effects from the medication. I completed financial assistance forms on 04/12/13.  Due for CXR today. Not really doing exercise - she did do pulmonary rehab at [facility].    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 Last CXR 05/19/13, last CT scan 01/24/12. She is still on the pirfenidone trial, receiving study drug. She is fairly stable. Will be converted to the commercial med soon. Tolerates soon.   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 Returns for f/u. She is now on pirfenidone 3 pills tid and is tolerating. She is having more problems with fatigue and dyspnea with exertion - has lay down after eating dinner, with chores. Her synthroid hasn't been adjusted in a long time.  Her last labs were March 3, 15.   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 She is on  pirfenidone 3 pills tid. She has been tolerating well from GI standpoint. TSH normal. She has some fatigue, otherwise doing well. She needs LFT done in 3 months and after that we can go to every 6 months,.   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 On pirfenidone 3 pills tid since August 2014. Last CT scan 5/'15 was stable, described as an NSIP pattern.  LFT were done today, not yet  available. She has noticed some slightly raised pale pink macules, not pruritic, since last month. She has been careful to wear sunscreen.   ROV 02/14/15 --  The patient has completed her study of combination pirfenidone + study drug. She is now back on pirfenidone tid. She had some nausea when she was on both drugs, but this has resolved now. Her FEV1 was stable on serial PFT's through the study. Her last Ct chest was may 2015. Her LFT's were checked by [facility] and were normal.      Objective:   Physical Exam Filed Vitals:   02/14/15 0944  BP: 120/86  Pulse: 95  Height: _0  (1.575 m)  Weight: 141 lb 3.2 oz (64.048 kg)  SpO2: 97%   GEN: A/Ox3; pleasant , NAD, well nourished   HEENT:  [place]/AT,  EACs-clear, TMs-wnl, NOSE-clear, THROAT-clear, no lesions, no postnasal drip or exudate noted.   NECK:  Supple w/ fair ROM; no JVD; normal carotid impulses w/o bruits; no thyromegaly or nodules palpated; no lymphadenopathy.  RESP  Bibasilar insp crackles, R > L  CARD:  RRR, no m/r/g  , no peripheral edema, pulses intact, no cyanosis or clubbing.  Musco: Warm bil, no deformities or joint swelling noted.   Neuro: alert, no focal deficits noted.    Skin: Warm, no lesions or rashes     CT chest 11/05/13 --  COMPARISON: 01/24/2012.  FINDINGS:  A 1.6 cm low-attenuation lesion in the left lobe of the thyroid is  unchanged. Mediastinal lymph nodes are not enlarged by CT size  criteria. Hilar regions are difficult to definitively evaluate  without IV contrast. No axillary adenopathy. Atherosclerotic  calcification of the arterial vasculature, including coronary  arteries. Heart is mildly enlarged. No pericardial effusion.  Moderate hiatal hernia.  Image quality is somewhat degraded by respiratory motion. There is  mild subpleural reticulation with slight basilar predominance.  Traction bronchiolectasis is seen primarily at the lung bases.  Findings appear stable from 01/24/2012. A few  scattered pulmonary  nodules measure 4 mm or less in size and are unchanged from  01/24/2012, rendering them benign. No pleural fluid. Airway is  unremarkable. Mild air trapping on inspiratory and expiratory  imaging.  Incidental imaging of the upper abdomen shows a 7 mm low-attenuation  lesion in the left hepatic lobe, unchanged and likely a cyst or  hemangioma. Visualized portions of the liver, gallbladder, adrenal  glands, spleen, pancreas, stomach and bowel are otherwise grossly  unremarkable. No upper abdominal adenopathy. No worrisome lytic or  sclerotic lesions. Degenerative changes are seen in the spine.  IMPRESSION:  1. 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  2. Coronary artery calcification.      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 She completed her clinical trial of dual antibiotic therapy. She is clinically unchanged. We will continue her current regimen and plan to  repeat her CT scan of the chest and her pulmonary function testing in May 2017 to look for stability or interval change

## 2015-02-14 NOTE — Patient Instructions (Addendum)
We will repeat your CT scan and your PFT's in May 2017.  Continue your pirfenidone 3 times a day  Continue to have your labwork as scheduled to check your liver tests.  Follow with Dr Lamonte Sakai in May after the CT scan and PFT to review.

## 2015-03-07 ENCOUNTER — Other Ambulatory Visit: Payer: Self-pay | Admitting: *Deleted

## 2015-03-07 MED ORDER — PIRFENIDONE 267 MG PO CAPS: ORAL_CAPSULE | ORAL | Status: DC

## 2015-03-15 ENCOUNTER — Other Ambulatory Visit: Payer: Self-pay | Admitting: Internal Medicine

## 2015-03-15 DIAGNOSIS — Z006 Encounter for examination for normal comparison and control in clinical research program: Secondary | ICD-10-CM

## 2015-03-15 DIAGNOSIS — J84112 Idiopathic pulmonary fibrosis: Secondary | ICD-10-CM

## 2015-03-16 ENCOUNTER — Other Ambulatory Visit: Payer: Self-pay | Admitting: Internal Medicine

## 2015-03-16 ENCOUNTER — Ambulatory Visit (INDEPENDENT_AMBULATORY_CARE_PROVIDER_SITE_OTHER): Payer: Medicare Other | Admitting: Internal Medicine

## 2015-03-16 VITALS — BP 120/80 | HR 89 | Temp 97.7°F | Resp 17 | Ht 63.0 in | Wt 143.4 lb

## 2015-03-16 DIAGNOSIS — J84112 Idiopathic pulmonary fibrosis: Secondary | ICD-10-CM

## 2015-03-16 DIAGNOSIS — Z006 Encounter for examination for normal comparison and control in clinical research program: Secondary | ICD-10-CM | POA: Diagnosis not present

## 2015-03-16 NOTE — Patient Instructions (Signed)
ICD-9-CM ICD-10-CM   1. Research study patient V70.7 Z00.6   2. IPF (idiopathic pulmonary fibrosis) (HCC) 516.31 J84.112    Proceed per protocool of PRAISE study

## 2015-03-16 NOTE — Progress Notes (Signed)
PRAISE (ClinicalTrials.gov Identifier: [id number])  RESEARCH SUBJECT. Phase 2, randomized, double-blind, placebo-controlled study to evaluate the safety and tolerability of FG-3019 in subjects with IPF, and the efficacy of FG-3019 in slowing the loss of forced vital capacity (FVC) and the progression of IPF in these subjects. FG-3019 is a human recombinant monoclonal antibody that targets connective tissue growth factor (CTGF), which is a key factor in the pathogenesis of fibrosis. The ability of FG-3019 to block CTGF makes it a candidate for treating IPF.PRAISE is sponsored by [employer].  [employer] is a [employer] located in [place], [place]. FG-3019 is administered as an IV infusion every three weeks .    Inclusion Criteria: IPF for </= 5 years, age 75-80 and FVC is >55% and your DLCO >/30% and body weight < 130kg  Key other data: side effects with infusion - Cough > 30%, Fatigue > 20%, Dyspnea > 20%, URI > 15%, Headache > 15%, Diarrhea > 15%. Rare is flushing, arm pain, back pain and hypertension. SEverity 75% is miild-moderate. > 74% have atleast 1 side effect over time. So far none discontinued or died during treatment,. No overall difference between placebo and drug  S:  This visit 03/16/2015  for Tina Calderon with 07-21-1940 who is Subject Number [id number] is a research Visit and is for purpose of screening and is number 1 on PROTOCOL. Informed Consent process was performed prior to any study requirements being completed.  Subject was able to ask questions and have those answered to her satisfaction.  She understands this research study is voluntary and is a phase II study primarily looking at safety.  We also talked about the need for blood work needing to be done several times on a treatment day.  She willingly signed consent on this day at 12:50.  She will return tomorrow to continue with screening requirements. She is is SUBSTUDY   O Filed Vitals:   03/16/15 1300  BP: 120/80  Pulse: 89   Temp: 97.7 F (36.5 C)  TempSrc: Oral  Resp: 17  Height: _0  (1.6 m)  Weight: 143 lb 6.4 oz (65.046 kg)  SpO2: 95%   Estimated body mass index is 25.41 kg/(m^2) as calculated from the following:   Height as of this encounter: _1  (1.6 m).   Weight as of this encounter: 143 lb 6.4 oz (65.046 kg).  Exam in sheet - 1/3rd basal crackles      ICD-9-CM ICD-10-CM   1. Research study patient V70.7 Z00.6   2. IPF (idiopathic pulmonary fibrosis) ([place]) 516.31 J84.112       1. Scientific Purpose  Clinical research is designed to produce generalizable knowledge and to answer questions about the safety and efficacy of intervention(s) under study in order to determine whether or not they may be useful for the care of future patients.  2. Study Procedures  Participation in a trial may involve procedures or tests, in addition to the intervention(s) under study, that are intended only or primarily to generate scientific knowledge and that are otherwise not necessary for patient care.   3. Uncertainty  For intervention(s) under study in clinical research, there often is less knowledge and more uncertainty about the risks and benefits to a population of trial participants than there is when a doctor offers a patient standard interventions.   4. Adherence to Protocol  Administration of the intervention(s) under study is typically based on a strict protocol with defined dose, scheduling, and use or avoidance of concurrent medications,  compared to administration of standard interventions.  5. Clinician as Investigator  Clinicians who are in health care settings provide treatment; in a clinical trial setting, they are also investigating safety and efficacy of an intervention. In otherwise your doctor or nurse practitioner can be wearing 2 hats - one as care giver another as Company secretary  6. Patient as Visual merchandiser Subject  Patients participating in research trials are  research subjects or volunteers. In other words participating in research is 100% voluntary and at one's own free weill. The decision to participate or not participate will NOT affect patient care and the doctor-patient relationship in any way  7. Financial Conflict of Interest Disclosure  Your referring physician(s) for the research trial has an investment interest in [employer], [facility] the clinical trials site and is both the company and the investigators are being compensated for their effort in trial research activities.   At no point is anyone being directly compensated merely for referring you to research participation  8. PRoceed wiuth PRAISE study screening having signed ICF with [facility]   Dr. Brand Males, M.D., [facility].C.P Pulmonary and Critical Care Medicine Staff Physician [facility] Pager: [phone], If no answer or between  15:00h - 7:00h: call [phone]  03/16/2015 1:41 PM

## 2015-03-17 ENCOUNTER — Ambulatory Visit (HOSPITAL_COMMUNITY)
Admission: RE | Admit: 2015-03-17 | Discharge: 2015-03-17 | Disposition: A | Discharge status: Home / Self Care | Payer: Medicare Other | Source: Ambulatory Visit | Attending: Internal Medicine | Admitting: Internal Medicine

## 2015-03-17 DIAGNOSIS — Z006 Encounter for examination for normal comparison and control in clinical research program: Secondary | ICD-10-CM | POA: Diagnosis not present

## 2015-03-17 DIAGNOSIS — J84112 Idiopathic pulmonary fibrosis: Secondary | ICD-10-CM

## 2015-03-17 LAB — PULMONARY FUNCTION TEST
DL/VA % pred: 109 %
DL/VA: 4.99 ml/min/mmHg/L
DLCO unc % pred: 56 %
DLCO unc: 12.11 ml/min/mmHg
FEF 25-75 Pre: 1.45 L/sec
FEF2575-%Pred-Pre: 94 %
FEV1-%Pred-Pre: 64 %
FEV1-Pre: 1.24 L
FEV1FVC-%Pred-Pre: 111 %
FEV6-%Pred-Pre: 60 %
FEV6-Pre: 1.47 L
FEV6FVC-%Pred-Pre: 105 %
FVC-%Pred-Pre: 57 %
FVC-PRE: 1.47 L
PRE FEV1/FVC RATIO: 84 %
PRE FEV6/FVC RATIO: 100 %

## 2015-03-20 ENCOUNTER — Ambulatory Visit (INDEPENDENT_AMBULATORY_CARE_PROVIDER_SITE_OTHER)
Admission: RE | Admit: 2015-03-20 | Discharge: 2015-03-20 | Disposition: A | Discharge status: Home / Self Care | Payer: Medicare Other | Source: Ambulatory Visit | Attending: Internal Medicine | Admitting: Internal Medicine

## 2015-03-20 DIAGNOSIS — J84112 Idiopathic pulmonary fibrosis: Secondary | ICD-10-CM | POA: Diagnosis not present

## 2015-03-20 DIAGNOSIS — Z006 Encounter for examination for normal comparison and control in clinical research program: Secondary | ICD-10-CM | POA: Diagnosis not present

## 2015-03-21 ENCOUNTER — Encounter: Payer: Medicare Other | Admitting: Internal Medicine

## 2015-03-27 ENCOUNTER — Telehealth: Payer: Self-pay | Admitting: Emergency Medicine

## 2015-03-27 MED ORDER — OMEPRAZOLE 20 MG PO CPDR: 20.0000 mg | DELAYED_RELEASE_CAPSULE | Freq: Every day | ORAL | Status: DC

## 2015-03-27 NOTE — Telephone Encounter (Signed)
Spoke with pt. She is aware RX has been sent in. Nothing further needed

## 2015-03-30 ENCOUNTER — Ambulatory Visit (INDEPENDENT_AMBULATORY_CARE_PROVIDER_SITE_OTHER): Payer: Medicare Other | Admitting: Pulmonary Disease

## 2015-03-30 ENCOUNTER — Encounter: Payer: Self-pay | Admitting: Pulmonary Disease

## 2015-03-30 VITALS — BP 138/82 | HR 94 | Ht 63.0 in | Wt 141.6 lb

## 2015-03-30 DIAGNOSIS — J209 Acute bronchitis, unspecified: Secondary | ICD-10-CM | POA: Diagnosis not present

## 2015-03-30 DIAGNOSIS — J849 Interstitial pulmonary disease, unspecified: Secondary | ICD-10-CM | POA: Diagnosis not present

## 2015-03-30 MED ORDER — PREDNISONE 10 MG PO TABS: 10.0000 mg | ORAL_TABLET | ORAL | Status: DC

## 2015-03-30 MED ORDER — AZITHROMYCIN 250 MG PO TABS: 250.0000 mg | ORAL_TABLET | Freq: Every day | ORAL | Status: DC

## 2015-03-30 NOTE — Progress Notes (Signed)
   Subjective:    Patient ID: Tina Calderon, female    DOB: August 15, 1940, 74 y.o.   MRN: [id number]  HPI  74/F idiopathic ILD, mixed disease on spirometry, hypertension with diastolic dysfunction. On pirfenidone  since August 2014 Strong f/h - sister @ age 73, brother @ age 38  The patient completed her study of combination pirfenidone + study drug. She is now back on pirfenidone tid. Her FEV1 was stable on serial PFT's through the study.   Chief Complaint  Patient presents with  . Acute Visit    RB pt. COUGH. Pt c/o dry cough, sore throat, watery eyes, nasal congestion and some SOB. Symptoms have been present for several days. Pt denies f/n/v/d, CP/tightness.    She developed sore throat followed by dry cough, no nasal congestion, no fevers for the last 2 days. She denies chest pain, mild increase in her dyspnea  Last CT scan 03/2015 reviewed- was stable, described as an NSIP pattern.  Review of Systems neg for any significant sore throat, dysphagia, itching, sneezing, nasal congestion or excess/ purulent secretions, fever, chills, sweats, unintended wt loss, pleuritic or exertional cp, hempoptysis, orthopnea pnd or change in chronic leg swelling. Also denies presyncope, palpitations, heartburn, abdominal pain, nausea, vomiting, diarrhea or change in bowel or urinary habits, dysuria,hematuria, rash, arthralgias, visual complaints, headache, numbness weakness or ataxia.     Objective:   Physical Exam  Gen. Pleasant, well-nourished, in no distress ENT - no lesions, no post nasal drip Neck: No JVD, no thyromegaly, no carotid bruits Lungs: no use of accessory muscles, no dullness to percussion, bibasal rales , no rhonchi  Cardiovascular: Rhythm regular, heart sounds  normal, no murmurs or gallops, no peripheral edema Musculoskeletal: No deformities, no cyanosis or clubbing         Assessment & Plan:

## 2015-03-30 NOTE — Patient Instructions (Signed)
Z-pak - take if sputum changes color Prednisone 10 mg tabs  Take 2 tabs daily with food x 5ds, then 1 tab daily with food x 5ds then STOP Call if worse

## 2015-03-30 NOTE — Assessment & Plan Note (Signed)
-  Latest imaging favors NSIP pattern - current flare due to LRI -may be viral Z-pak - take if sputum changes color Prednisone 10 mg tabs  Take 2 tabs daily with food x 5ds, then 1 tab daily with food x 5ds then STOP Call if worse

## 2015-05-12 ENCOUNTER — Other Ambulatory Visit: Payer: Self-pay

## 2015-05-12 DIAGNOSIS — Z1231 Encounter for screening mammogram for malignant neoplasm of breast: Secondary | ICD-10-CM

## 2015-06-20 ENCOUNTER — Ambulatory Visit: Payer: Medicare Other

## 2015-07-03 ENCOUNTER — Ambulatory Visit
Admission: RE | Admit: 2015-07-03 | Discharge: 2015-07-03 | Disposition: A | Discharge status: Home / Self Care | Payer: Medicare Other | Source: Ambulatory Visit

## 2015-07-03 DIAGNOSIS — Z1231 Encounter for screening mammogram for malignant neoplasm of breast: Secondary | ICD-10-CM

## 2015-08-03 ENCOUNTER — Other Ambulatory Visit: Payer: Self-pay | Admitting: Emergency Medicine

## 2015-10-09 ENCOUNTER — Ambulatory Visit (INDEPENDENT_AMBULATORY_CARE_PROVIDER_SITE_OTHER)
Admission: RE | Admit: 2015-10-09 | Discharge: 2015-10-09 | Disposition: A | Discharge status: Home / Self Care | Payer: Medicare Other | Source: Ambulatory Visit | Attending: Emergency Medicine | Admitting: Emergency Medicine

## 2015-10-09 DIAGNOSIS — J84112 Idiopathic pulmonary fibrosis: Secondary | ICD-10-CM

## 2015-10-13 ENCOUNTER — Ambulatory Visit (INDEPENDENT_AMBULATORY_CARE_PROVIDER_SITE_OTHER): Payer: Medicare Other | Admitting: Adult Health

## 2015-10-13 ENCOUNTER — Encounter: Payer: Self-pay | Admitting: Adult Health

## 2015-10-13 VITALS — BP 124/70 | HR 94 | Temp 97.9°F | Ht 62.0 in | Wt 136.0 lb

## 2015-10-13 DIAGNOSIS — J069 Acute upper respiratory infection, unspecified: Secondary | ICD-10-CM

## 2015-10-13 DIAGNOSIS — J84112 Idiopathic pulmonary fibrosis: Secondary | ICD-10-CM | POA: Diagnosis not present

## 2015-10-13 MED ORDER — PREDNISONE 10 MG PO TABS: ORAL_TABLET | ORAL | Status: DC

## 2015-10-13 MED ORDER — AZITHROMYCIN 250 MG PO TABS: ORAL_TABLET | ORAL | Status: AC

## 2015-10-13 NOTE — Assessment & Plan Note (Signed)
Stable on CT chest on 10/09/15  Return for PFT on 5/16/ as planned

## 2015-10-13 NOTE — Progress Notes (Signed)
Subjective:    Patient ID: [name], female    DOB: Nov 01, 1940, 75 y.o.   MRN: [id number]  HPI 74/F idiopathic ILD, mixed disease on spirometry, hypertension with diastolic dysfunction. On pirfenidone  since August 2014 Strong f/h - sister @ age 15, brother @ age 12  The patient completed her study of combination pirfenidone + study drug. She is now back on pirfenidone tid. Her FEV1 was stable on serial PFT's through the study.  Last CT scan 10/09/15  reviewed- was stable, described as an NSIP pattern.  10/13/2015 Acute OV  Pt presents for an acute office visit.  Complains of dry cough, chest tightness, right ear pain at times, sore throat. SOB and wheezing with exertion x 3 days.  Denies any sinus pressure/drainage, chest congesiton, fever, nausea or vomiting.  Under some stress, mother passed away 1 month ago. (had stroke, passed at 63) .  Had recent follow up CT chest 10/09/15 was stable.  She denies chest pain , orthopnea, edema or fever.   Past Medical History  Diagnosis Date  . Thyroid disease   . Hypertension   . Prolapse of bladder   . Pelvic relaxation   . Hypothyroidism   . Anginal pain ([place])   . Shortness of breath   . Cancer ([place])   . CHF (congestive heart failure) ([place]) 01/22/2012  . ILD (interstitial lung disease) ([place]) 02/04/2012  . Chronic fatigue 07/15/2014   Current Outpatient Prescriptions on File Prior to Visit  Medication Sig Dispense Refill  . albuterol (PROVENTIL HFA;VENTOLIN HFA) 108 (90 BASE) MCG/ACT inhaler Inhale 2 puffs into the lungs every 6 (six) hours as needed for wheezing or shortness of breath. 1 Inhaler 6  . aspirin 81 MG tablet Take 81 mg by mouth daily.    . calcium-vitamin D (OSCAL WITH D) 500-200 MG-UNIT per tablet Take 1 tablet by mouth every morning.    . Cranberry 1000 MG CAPS Take 1 capsule by mouth daily.    . ESBRIET 267 MG CAPS TAKE THREE CAPSULES BY MOUTH THREE TIMES DAILY WITH FOOD. 270 capsule 4  . hydrochlorothiazide  (HYDRODIURIL) 25 MG tablet Take 1 tablet (25 mg total) by mouth every morning.    [name] levothyroxine (SYNTHROID, LEVOTHROID) 88 MCG tablet Take 1 tablet by mouth daily.    . Multiple Vitamin (MULTIVITAMIN WITH MINERALS) TABS Take 1 tablet by mouth every morning. Centrum silver    . omeprazole (PRILOSEC) 20 MG capsule Take 1 capsule (20 mg total) by mouth daily. 90 capsule 2  . loratadine (CLARITIN) 10 MG tablet Take 10 mg by mouth every evening. Reported on 10/13/2015     No current facility-administered medications on file prior to visit.      Review of Systems Constitutional:   No  weight loss, night sweats,  Fevers, chills,  +fatigue, or  lassitude.  HEENT:   No headaches,  Difficulty swallowing,  Tooth/dental problems, or  Sore throat,                No sneezing, itching, ear ache,  +nasal congestion, post nasal drip,   CV:  No chest pain,  Orthopnea, PND, swelling in lower extremities, anasarca, dizziness, palpitations, syncope.   GI  No heartburn, indigestion, abdominal pain, nausea, vomiting, diarrhea, change in bowel habits, loss of appetite, bloody stools.   Resp:    No chest wall deformity  Skin: no rash or lesions.  GU: no dysuria, change in color of urine, no urgency or frequency.  No flank pain, no hematuria   MS:  No joint pain or swelling.  No decreased range of motion.  No back pain.  Psych:  No change in mood or affect. No depression or anxiety.  No memory loss.         Objective:   Physical Exam   Filed Vitals:   10/13/15 1424  BP: 124/70  Pulse: 94  Temp: 97.9 F (36.6 C)  TempSrc: Oral  Height: _0  (1.575 m)  Weight: 136 lb (61.689 kg)  SpO2: 97%     GEN: A/Ox3; pleasant , NAD, elderly   HEENT:  [place]/AT,  EACs-mild wax , TMs-wnl, NOSE-clear, THROAT-clear, no lesions, no postnasal drip or exudate noted.   NECK:  Supple w/ fair ROM; no JVD; normal carotid impulses w/o bruits; no thyromegaly or nodules palpated; no lymphadenopathy.  RESP  BB  crackles , no accessory muscle use, no dullness to percussion  CARD:  RRR, no m/r/g  , no peripheral edema, pulses intact, no cyanosis or clubbing.  GI:   Soft & nt; nml bowel sounds; no organomegaly or masses detected.  Musco: Warm bil, no deformities or joint swelling noted.   Neuro: alert, no focal deficits noted.    Skin: Warm, no lesions or rashes  Tammy Parrett NP-C  [facility]  10/13/2015         Assessment & Plan:

## 2015-10-13 NOTE — Assessment & Plan Note (Signed)
Flare   Plan  Zpack take as directed.  Mucinex DM Twice daily  As needed  Cough/congestion  Prednisone taper over next week.  Please contact office for sooner follow up if symptoms do not improve or worsen or seek emergency care  follow up Dr. Lamonte Sakai as planned with PFT later this month.

## 2015-10-13 NOTE — Patient Instructions (Addendum)
Zpack take as directed.  Mucinex DM Twice daily  As needed  Cough/congestion  Prednisone taper over next week.  Please contact office for sooner follow up if symptoms do not improve or worsen or seek emergency care  follow up Dr. Lamonte Sakai as planned with PFT later this month.

## 2015-10-23 ENCOUNTER — Ambulatory Visit (INDEPENDENT_AMBULATORY_CARE_PROVIDER_SITE_OTHER): Payer: Medicare Other | Admitting: Emergency Medicine

## 2015-10-23 ENCOUNTER — Encounter: Payer: Self-pay | Admitting: Emergency Medicine

## 2015-10-23 VITALS — BP 118/70 | HR 98 | Ht 62.0 in | Wt 137.0 lb

## 2015-10-23 DIAGNOSIS — R05 Cough: Secondary | ICD-10-CM | POA: Diagnosis not present

## 2015-10-23 DIAGNOSIS — J069 Acute upper respiratory infection, unspecified: Secondary | ICD-10-CM

## 2015-10-23 DIAGNOSIS — I272 Other secondary pulmonary hypertension: Secondary | ICD-10-CM

## 2015-10-23 DIAGNOSIS — J84112 Idiopathic pulmonary fibrosis: Secondary | ICD-10-CM | POA: Diagnosis not present

## 2015-10-23 DIAGNOSIS — R059 Cough, unspecified: Secondary | ICD-10-CM

## 2015-10-23 LAB — PULMONARY FUNCTION TEST
DLCO COR: 11.83 ml/min/mmHg
FEF2575-%CHANGE-POST: 20 %
FEF2575-%PRED-PRE: 113 %
FEV1-%CHANGE-POST: 3 %
FEV1-%PRED-PRE: 64 %
FEV1-POST: 1.26 L
FEV1-PRE: 1.22 L
FEV6-%CHANGE-POST: 1 %
FEV6-%PRED-PRE: 59 %
FEV6-POST: 1.47 L
FEV6-PRE: 1.44 L
FEV6FVC-%PRED-PRE: 105 %
FVC-%CHANGE-POST: 1 %

## 2015-10-23 NOTE — Assessment & Plan Note (Signed)
Stable by pulmonary function testing. Also stable by CT scan of the chest going all the way back to 2013. We will continue her pirfenidone 3 times a day. She does still have some exertional dyspnea and I would like to check an echocardiogram to evaluate for possible evolving pulmonary hypertension. Her last one was in 2013.

## 2015-10-23 NOTE — Progress Notes (Signed)
Subjective:    Patient ID: Tina Calderon, female    DOB: 1940-12-28, 75 y.o.   MRN: [id number]  HPI 75 yo female never smoker seen for initial pulmonary consult during hospital for acute respiratory failure   [facility] follow up 01/30/12 -  Pt was admitted 2 weeks ago for chest pain and dyspnea.  Found to have Acute respiratory failure with hypoxia--  Tx for possible diastolic CHF with aggressive diuresis Echo showed EF 60% , no mention of diastolic dysfxn.  HRCT consistent with ILD-  CCM consult with autoimmune workup that was unrevealing with neg ANA, ANCA, RA factor , nml ESR , CRP    Does light housework but not able walk long distances due to dyspnea  FH of pulmonary fibrosis -sibling -they were smokers. NO significant desats with walking in cardiology yesterday, desated to 93% on room air.  No cough or wheezing  No edema.   Since discharge she is feeling better with less DOE. But does get winded with walking long distances.   ROV 02/21/12 -- follows up for ILD noted during hospitalization for resp failure. Treated for possible diastolic CHF. CT scan shows some mild basilar fibrotic change. PFT's 9/3 >> probable mixed disease, no BD response, restricted volumes, decrease DLCO that corrects for [place].  She had family hx of ILD.  She had walking oximetry that she says was reassuring. She feels no better today than at hospital discharge.   ROV 03/24/12 -- ILD without clear etiology, mixed dz on PFT 02/11/12. Initiated a trial prednisone last month >> She doesn't believe that her dyspnea has changed.  Last time she did not desat.   ROV 06/23/12 -- ILD without clear etiology, mixed dz on PFT 02/11/12. Trial of pred >> weaned to off. Sent her to pulm rehab at [place], feels that it has benefited her. She isn't as SOB as before. CXR today >> stable bibasilar ILD, hiatal hernia.   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 Also possible component diastolic dysfxn. She has a family hx of ILD. She tells me that her dyspnea is worse compared with last year. She is having some spells of near syncope on exertion. Walked today to exhaustion without desaturation.   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 She has been part of the pirfenidone trial and has been on the medication. She will be transitioning to the commercial drug. Denies any side effects from the medication. I completed financial assistance forms on 04/12/13.  Due for CXR today. Not really doing exercise - she did do pulmonary rehab at [facility].    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 Last CXR 05/19/13, last CT scan 01/24/12. She is still on the pirfenidone trial, receiving study drug. She is fairly stable. Will be converted to the commercial med soon. Tolerates soon.   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 Returns for f/u. She is now on pirfenidone 3 pills tid and is tolerating. She is having more problems with fatigue and dyspnea with exertion - has lay down after eating dinner, with chores. Her synthroid hasn't been adjusted in a long time.  Her last labs were March 3, 15.   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 She is on  pirfenidone 3 pills tid. She has been tolerating well from GI standpoint. TSH normal. She has some fatigue, otherwise doing well. She needs LFT done in 3 months and after that we can go to every 6 months,.   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 On pirfenidone 3 pills tid since August 2014. Last CT scan 5/'15 was stable, described as an NSIP pattern.  LFT were done today, not yet  available. She has noticed some slightly raised pale pink macules, not pruritic, since last month. She has been careful to wear sunscreen.   ROV 02/14/15 --  The patient has completed her study of combination pirfenidone + study drug. She is now back on pirfenidone tid. She had some nausea when she was on both drugs, but this has resolved now. Her FEV1 was stable on serial PFT's through the study. Her last Ct chest was may 2015. Her LFT's were checked by [facility] and were normal.   ROV 10/23/15 -- patient with a history of interstitial lung disease of unclear etiology, evidence of obstruction on spirometry history of hypertension with diastolic dysfunction. She was part of the pirfenidone  study and now is on pirfenidone  3 times a day.she underwent full pulmonary function testing on 5/15 that I have personally reviewed. This showed an FVC of 1.44 L, FEV1 1.22 L without any evidence of significant coexisting obstruction. This is stable compared with her pulmonary function testing last performed on 03/17/15.  Fairly stable all the way back to a year ago.  Her last CT chest was 10/09/15 that I reviewed, very stable interstitial changes going all the way back to 2013.  She was recently treated for a URI + AE, was treated with pred and abx > is now improved. Not on loratadine every day. She believs that she is about teh same. She does get fatigued w exertion.        Objective:   Physical Exam Filed Vitals:   10/23/15 1017 10/23/15 1018  BP:  118/70  Pulse:  98  Height: _0  (1.575 m)   Weight: 137 lb (62.143 kg)   SpO2:  98%   GEN: A/Ox3; pleasant , NAD, well nourished   HEENT:  [place]/AT,  EACs-clear, TMs-wnl, NOSE-clear, THROAT-clear, no lesions, no postnasal drip or exudate noted.   NECK:  Supple w/ fair ROM; no JVD; normal carotid impulses w/o bruits; no thyromegaly or nodules palpated; no lymphadenopathy.  RESP  Bibasilar insp crackles, R > L  CARD:  RRR, no m/r/g  , no peripheral edema, pulses  intact, no cyanosis or clubbing.  Musco: Warm bil, no deformities or joint swelling noted.   Neuro: alert, no focal deficits noted.    Skin: Warm, no lesions or rashes   10/09/15 --  CT chest --  COMPARISON: 03/20/2015, 11/05/2013, 01/24/2012 and 01/20/2012.  FINDINGS: Mediastinum/Lymph Nodes: Mediastinal lymph nodes measure up to 11 mm in the low right paratracheal station, stable. Hilar regions are difficult to definitively evaluate without IV contrast. No axillary adenopathy. Atherosclerotic calcification of the arterial vasculature, including coronary arteries. Heart is mildly enlarged. No pericardial effusion. Large hiatal hernia.  Lungs/Pleura: Peripheral and basilar predominant pattern of subpleural reticulation, traction bronchiectasis/ bronchiolectasis, scattered ground-glass and mild architectural distortion is likely unchanged from 01/24/2012. Thinner slice collimation on the current exam allows increased resolution and better visualization of the findings. There is air trapping. No pleural fluid. Airway is unremarkable.  Upper abdomen: Visualized portions of the liver,  gallbladder, adrenal glands, kidneys, spleen, pancreas unremarkable. Large hiatal hernia.  Musculoskeletal: No worrisome lytic or sclerotic lesions.  IMPRESSION: 1. Pulmonary parenchymal pattern of fibrosis is likely unchanged from 01/24/2012. Thinner slice collimation on the current exam increases resolution and accentuates the findings when compared with prior examinations. 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 Usual interstitial pneumonitis is not excluded, given the provided history of idiopathic pulmonary fibrosis. 2. Coronary artery calcification. 3. Large hiatal hernia        Assessment & Plan:  IPF (idiopathic pulmonary fibrosis) Stable by pulmonary function testing. Also stable by CT scan of the chest going all the way back to 2013. We will continue  her pirfenidone 3 times a day. She does still have some exertional dyspnea and I would like to check an echocardiogram to evaluate for possible evolving pulmonary hypertension. Her last one was in 2013.  Acute URI resolved  Cough Improved. Continue loratadine 10 mg daily through the allergy season

## 2015-10-23 NOTE — Assessment & Plan Note (Signed)
Improved. Continue loratadine 10 mg daily through the allergy season

## 2015-10-23 NOTE — Assessment & Plan Note (Signed)
resolved 

## 2015-10-23 NOTE — Progress Notes (Signed)
PFT done today. 

## 2015-10-23 NOTE — Patient Instructions (Signed)
Please continue your pirfenidone as you have been taking it  We will perform an echocardiogram to compare with 2013 Start taking your loratadine 2m every day during the allergy season.  Follow with Dr BLamonte Sakaiin 3 months or sooner if you have any problems.

## 2015-11-07 ENCOUNTER — Ambulatory Visit (HOSPITAL_COMMUNITY): Payer: Medicare Other | Attending: Cardiovascular Disease

## 2015-11-07 ENCOUNTER — Other Ambulatory Visit: Payer: Self-pay

## 2015-11-07 DIAGNOSIS — I951 Orthostatic hypotension: Secondary | ICD-10-CM | POA: Diagnosis not present

## 2015-11-07 DIAGNOSIS — I272 Other secondary pulmonary hypertension: Secondary | ICD-10-CM | POA: Diagnosis present

## 2015-11-07 DIAGNOSIS — I313 Pericardial effusion (noninflammatory): Secondary | ICD-10-CM | POA: Insufficient documentation

## 2015-11-20 ENCOUNTER — Telehealth: Payer: Self-pay | Admitting: Emergency Medicine

## 2015-11-20 NOTE — Telephone Encounter (Signed)
Spoke with pt. She is needing a [employer] form filled out for [employer]. Pt will be trying to get her Esbriet patient assistance through them this year. This form has been filled out and placed in RB's look at. I will have him sign this form once he returns to the office. Will route to myself for follow up.

## 2015-11-28 NOTE — Telephone Encounter (Signed)
Form was signed by RB on 11/27/2015. It has been faxed back to [employer]. Pt is aware. Nothing further was needed at this time.

## 2015-12-11 ENCOUNTER — Other Ambulatory Visit: Payer: Self-pay | Admitting: Emergency Medicine

## 2015-12-26 ENCOUNTER — Telehealth: Payer: Self-pay | Admitting: Emergency Medicine

## 2015-12-26 NOTE — Telephone Encounter (Signed)
Received a fax from [employer] for PA renewal of Esbriet 245m  Form filled out and attached OV notes, Echo and HRCT results.  Dr BLamonte Sakaisigned the form attached and this was faxed back to DRiverton@ [phone]. Faxed forms also given to LRia Commentto hold until approved.

## 2016-01-02 NOTE — Telephone Encounter (Signed)
Received a PA approval for Esbriet. Approved through 06/09/2016.

## 2016-01-29 ENCOUNTER — Ambulatory Visit (INDEPENDENT_AMBULATORY_CARE_PROVIDER_SITE_OTHER): Payer: Medicare Other | Admitting: Emergency Medicine

## 2016-01-29 ENCOUNTER — Encounter: Payer: Self-pay | Admitting: Emergency Medicine

## 2016-01-29 VITALS — BP 114/78 | HR 85 | Ht 62.0 in | Wt 137.0 lb

## 2016-01-29 DIAGNOSIS — J849 Interstitial pulmonary disease, unspecified: Secondary | ICD-10-CM | POA: Diagnosis not present

## 2016-01-29 DIAGNOSIS — J84112 Idiopathic pulmonary fibrosis: Secondary | ICD-10-CM | POA: Diagnosis not present

## 2016-01-29 DIAGNOSIS — J4520 Mild intermittent asthma, uncomplicated: Secondary | ICD-10-CM | POA: Diagnosis not present

## 2016-01-29 DIAGNOSIS — J309 Allergic rhinitis, unspecified: Secondary | ICD-10-CM | POA: Insufficient documentation

## 2016-01-29 HISTORY — DX: Mild intermittent asthma, uncomplicated: J45.20

## 2016-01-29 NOTE — Assessment & Plan Note (Signed)
Please continue your pirfenidone three times a day We will set up a 6 minute walk in our office to check your exercise tolerance and your oxygen levels.  We will discuss the timing of a repeat Ct scan at your next visit.  Follow with Dr Lamonte Sakai in 3 months or sooner if you have any problems.

## 2016-01-29 NOTE — Assessment & Plan Note (Signed)
Loratadine daily

## 2016-01-29 NOTE — Assessment & Plan Note (Signed)
Continue albuterol when necessary

## 2016-01-29 NOTE — Progress Notes (Signed)
Subjective:    Patient ID: Tina Calderon, female    DOB: 1941-05-07, 75 y.o.   MRN: [id number]  HPI 75 yo female never smoker seen for initial pulmonary consult during hospital for acute respiratory failure   [facility] follow up 01/30/12 -  Pt was admitted 2 weeks ago for chest pain and dyspnea.  Found to have Acute respiratory failure with hypoxia--  Tx for possible diastolic CHF with aggressive diuresis Echo showed EF 60% , no mention of diastolic dysfxn.  HRCT consistent with ILD-  CCM consult with autoimmune workup that was unrevealing with neg ANA, ANCA, RA factor , nml ESR , CRP    Does light housework but not able walk long distances due to dyspnea  FH of pulmonary fibrosis -sibling -they were smokers. NO significant desats with walking in cardiology yesterday, desated to 93% on room air.  No cough or wheezing  No edema.   Since discharge she is feeling better with less DOE. But does get winded with walking long distances.   ROV 02/21/12 -- follows up for ILD noted during hospitalization for resp failure. Treated for possible diastolic CHF. CT scan shows some mild basilar fibrotic change. PFT's 9/3 >> probable mixed disease, no BD response, restricted volumes, decrease DLCO that corrects for Va.  She had family hx of ILD.  She had walking oximetry that she says was reassuring. She feels no better today than at hospital discharge.   ROV 03/24/12 -- ILD without clear etiology, mixed dz on PFT 02/11/12. Initiated a trial prednisone last month >> She doesn't believe that her dyspnea has changed.  Last time she did not desat.   ROV 06/23/12 -- ILD without clear etiology, mixed dz on PFT 02/11/12. Trial of pred >> weaned to off. Sent her to pulm rehab at [place], feels that it has benefited her. She isn't as SOB as before. CXR today >> stable bibasilar ILD, hiatal hernia.   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 Also possible component diastolic dysfxn. She has a family hx of ILD. She tells me that her dyspnea is worse compared with last year. She is having some spells of near syncope on exertion. Walked today to exhaustion without desaturation.   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 She has been part of the pirfenidone trial and has been on the medication. She will be transitioning to the commercial drug. Denies any side effects from the medication. I completed financial assistance forms on 04/12/13.  Due for CXR today. Not really doing exercise - she did do pulmonary rehab at [facility].    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 Last CXR 05/19/13, last CT scan 01/24/12. She is still on the pirfenidone trial, receiving study drug. She is fairly stable. Will be converted to the commercial med soon. Tolerates soon.   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 Returns for f/u. She is now on pirfenidone 3 pills tid and is tolerating. She is having more problems with fatigue and dyspnea with exertion - has lay down after eating dinner, with chores. Her synthroid hasn't been adjusted in a long time.  Her last labs were March 3, 15.   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 She is on  pirfenidone 3 pills tid. She has been tolerating well from GI standpoint. TSH normal. She has some fatigue, otherwise doing well. She needs LFT done in 3 months and after that we can go to every 6 months,.   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 On pirfenidone 3 pills tid since August 2014. Last CT scan 5/'15 was stable, described as an NSIP pattern.  LFT were done today, not yet  available. She has noticed some slightly raised pale pink macules, not pruritic, since last month. She has been careful to wear sunscreen.   ROV 02/14/15 --  The patient has completed her study of combination pirfenidone + study drug. She is now back on pirfenidone tid. She had some nausea when she was on both drugs, but this has resolved now. Her FEV1 was stable on serial PFT's through the study. Her last Ct chest was may 2015. Her LFT's were checked by [facility] and were normal.   ROV 10/23/15 -- patient with a history of interstitial lung disease of unclear etiology, evidence of obstruction on spirometry history of hypertension with diastolic dysfunction. She was part of the pirfenidone  study and now is on pirfenidone  3 times a day.she underwent full pulmonary function testing on 5/15 that I have personally reviewed. This showed an FVC of 1.44 L, FEV1 1.22 L without any evidence of significant coexisting obstruction. This is stable compared with her pulmonary function testing last performed on 03/17/15.  Fairly stable all the way back to a year ago.  Her last CT chest was 10/09/15 that I reviewed, very stable interstitial changes going all the way back to 2013.  She was recently treated for a URI + AE, was treated with pred and abx > is now improved. Not on loratadine every day. She believs that she is about teh same. She does get fatigued w exertion.    ROV 01/29/16 -- The patient has a history of idiopathic interstitial lung disease with some coexisting obstructive disease on spirometry. She also has a history of hypertension with diastolic dysfunction. She is being managed on pirfenidone 3 times a day. Her CT scan and symptoms have been for the most part stable. We did an echocardiogram after her last visit That showed a peak PA pressure of 35 mmHg With normal RV size and function.  She uses albuterol about 2x a month. She believes that her breathing is stable. She has exertional fatigue and dyspnea. Allergy  sx > seems to be better controlled on loratadine.        Objective:   Physical Exam Vitals:   01/29/16 1026 01/29/16 1027  BP:  114/78  BP Location:  Left Arm  Cuff Size:  Normal  Pulse:  85  SpO2:  98%  Weight: 137 lb (62.1 kg)   Height: 5' 2" (1.575 m)    GEN: A/Ox3; pleasant , NAD, well nourished    HEENT:  [place]/AT,  EACs-clear, TMs-wnl, NOSE-clear, THROAT-clear, no lesions, no postnasal drip or exudate noted.   NECK:  Supple w/ fair ROM; no JVD; normal carotid impulses w/o bruits; no thyromegaly or nodules palpated; no lymphadenopathy.    RESP  Bibasilar insp crackles, soft  CARD:  RRR, no m/r/g  , no peripheral edema, pulses intact, no cyanosis or clubbing.  Musco: Warm bil, no deformities or joint swelling noted.   Neuro: alert, no focal deficits noted.    Skin: Warm, no lesions or rashes   10/09/15 --  CT chest --  COMPARISON: 03/20/2015, 11/05/2013, 01/24/2012 and 01/20/2012.  FINDINGS: Mediastinum/Lymph Nodes: Mediastinal lymph nodes measure up to 11 mm in the low right paratracheal station, stable. Hilar regions are difficult to definitively evaluate without IV contrast. No axillary adenopathy. Atherosclerotic calcification of the arterial vasculature, including coronary arteries. Heart is mildly enlarged. No pericardial effusion. Large hiatal hernia.  Lungs/Pleura: Peripheral and basilar predominant pattern of subpleural reticulation, traction bronchiectasis/ bronchiolectasis, scattered ground-glass and mild architectural distortion is likely unchanged from 01/24/2012. Thinner slice collimation on the current exam allows increased resolution and better visualization of the findings. There is air trapping. No pleural fluid. Airway is unremarkable.  Upper abdomen: Visualized portions of the liver, gallbladder, adrenal glands, kidneys, spleen, pancreas unremarkable. Large hiatal hernia.  Musculoskeletal: No worrisome lytic or sclerotic  lesions.  IMPRESSION: 1. Pulmonary parenchymal pattern of fibrosis is likely unchanged from 01/24/2012. Thinner slice collimation on the current exam increases resolution and accentuates the findings when compared with prior examinations. 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 Usual interstitial pneumonitis is not excluded, given the provided history of idiopathic pulmonary fibrosis. 2. Coronary artery calcification. 3. Large hiatal hernia        Assessment & Plan:  IPF (idiopathic pulmonary fibrosis) Please continue your pirfenidone three times a day We will set up a 6 minute walk in our office to check your exercise tolerance and your oxygen levels.  We will discuss the timing of a repeat Ct scan at your next visit.  Follow with Dr Lamonte Sakai in 3 months or sooner if you have any problems.   Allergic rhinitis Loratadine daily  Mild intermittent asthma Continue albuterol when necessary  Baltazar Apo, MD, PhD 01/29/2016, 10:51 AM [facility] [phone] or if no answer [phone]

## 2016-01-29 NOTE — Patient Instructions (Addendum)
Please continue your pirfenidone three times a day Continue loratadine daily Keep albuterol available to use 2 puffs as needed.  We will set up a 6 minute walk in our office to check your exercise tolerance and your oxygen levels.  We will discuss the timing of a repeat Ct scan at your next visit.  Follow with Tina Calderon in 3 months or sooner if you have any problems.

## 2016-02-14 ENCOUNTER — Telehealth: Payer: Self-pay | Admitting: Emergency Medicine

## 2016-02-14 NOTE — Telephone Encounter (Signed)
disreguard message has a script they can use per caller.Tina Calderon

## 2016-05-01 ENCOUNTER — Ambulatory Visit (INDEPENDENT_AMBULATORY_CARE_PROVIDER_SITE_OTHER): Payer: Medicare Other | Admitting: Emergency Medicine

## 2016-05-01 ENCOUNTER — Encounter: Payer: Self-pay | Admitting: Emergency Medicine

## 2016-05-01 ENCOUNTER — Other Ambulatory Visit (INDEPENDENT_AMBULATORY_CARE_PROVIDER_SITE_OTHER): Payer: Medicare Other

## 2016-05-01 VITALS — BP 134/84 | HR 112 | Ht 62.0 in | Wt 139.0 lb

## 2016-05-01 DIAGNOSIS — J849 Interstitial pulmonary disease, unspecified: Secondary | ICD-10-CM

## 2016-05-01 DIAGNOSIS — J84112 Idiopathic pulmonary fibrosis: Secondary | ICD-10-CM

## 2016-05-01 LAB — HEPATIC FUNCTION PANEL
ALT: 15 U/L (ref 0–35)
AST: 18 U/L (ref 0–37)
Albumin: 4.1 g/dL (ref 3.5–5.2)
Alkaline Phosphatase: 64 U/L (ref 39–117)
BILIRUBIN TOTAL: 0.3 mg/dL (ref 0.2–1.2)
Bilirubin, Direct: 0.1 mg/dL (ref 0.0–0.3)
TOTAL PROTEIN: 6.7 g/dL (ref 6.0–8.3)

## 2016-05-01 NOTE — Assessment & Plan Note (Signed)
Your 6 minute walk shows good exercise tolerance, good oxygen levels with exercise.  Continue your pirfenidone as you are taking it Continue loratadine every day.  Keep oxygen available to use 2 puffs up to every 4 hours if needed for shortness of breath.  Labwork today.  Follow with Dr Lamonte Sakai in 6 months or sooner if you have any problems

## 2016-05-01 NOTE — Patient Instructions (Addendum)
Your 6 minute walk shows good exercise tolerance, good oxygen levels with exercise.  Continue your pirfenidone as you are taking it Continue loratadine every day.  Keep oxygen available to use 2 puffs up to every 4 hours if needed for shortness of breath.  Labwork today.  Follow with Dr Lamonte Sakai in 6 months or sooner if you have any problems

## 2016-05-01 NOTE — Progress Notes (Signed)
Subjective:    Patient ID: Tina Calderon, female    DOB: 04-05-1941, 75 y.o.   MRN: [id number]  HPI 75 yo female never smoker seen for initial pulmonary consult during hospital for acute respiratory failure   [facility] follow up 01/30/12 -  Pt was admitted 2 weeks ago for chest pain and dyspnea.  Found to have Acute respiratory failure with hypoxia--  Tx for possible diastolic CHF with aggressive diuresis Echo showed EF 60% , no mention of diastolic dysfxn.  HRCT consistent with ILD-  CCM consult with autoimmune workup that was unrevealing with neg ANA, ANCA, RA factor , nml ESR , CRP    Does light housework but not able walk long distances due to dyspnea  FH of pulmonary fibrosis -sibling -they were smokers. NO significant desats with walking in cardiology yesterday, desated to 93% on room air.  No cough or wheezing  No edema.   Since discharge she is feeling better with less DOE. But does get winded with walking long distances.   ROV 02/21/12 -- follows up for ILD noted during hospitalization for resp failure. Treated for possible diastolic CHF. CT scan shows some mild basilar fibrotic change. PFT's 9/3 >> probable mixed disease, no BD response, restricted volumes, decrease DLCO that corrects for Va.  She had family hx of ILD.  She had walking oximetry that she says was reassuring. She feels no better today than at hospital discharge.   ROV 03/24/12 -- ILD without clear etiology, mixed dz on PFT 02/11/12. Initiated a trial prednisone last month >> She doesn't believe that her dyspnea has changed.  Last time she did not desat.   ROV 06/23/12 -- ILD without clear etiology, mixed dz on PFT 02/11/12. Trial of pred >> weaned to off. Sent her to pulm rehab at [place], feels that it has benefited her. She isn't as SOB as before. CXR today >> stable bibasilar ILD, hiatal hernia.   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 Also possible component diastolic dysfxn. She has a family hx of ILD. She tells me that her dyspnea is worse compared with last year. She is having some spells of near syncope on exertion. Walked today to exhaustion without desaturation.   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 She has been part of the pirfenidone trial and has been on the medication. She will be transitioning to the commercial drug. Denies any side effects from the medication. I completed financial assistance forms on 04/12/13.  Due for CXR today. Not really doing exercise - she did do pulmonary rehab at [facility]    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 Last CXR 05/19/13, last CT scan 01/24/12. She is still on the pirfenidone trial, receiving study drug. She is fairly stable. Will be converted to the commercial med soon. Tolerates soon.   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 Returns for f/u. She is now on pirfenidone 3 pills tid and is tolerating. She is having more problems with fatigue and dyspnea with exertion - has lay down after eating dinner, with chores. Her synthroid hasn't been adjusted in a long time.  Her last labs were March 3, 15.   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 She is on  pirfenidone 3 pills tid. She has been tolerating well from GI standpoint. TSH normal. She has some fatigue, otherwise doing well. She needs LFT done in 3 months and after that we can go to every 6 months,.   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 On pirfenidone 3 pills tid since August 2014. Last CT scan 5/'15 was stable, described as an NSIP pattern.  LFT were done today, not yet  available. She has noticed some slightly raised pale pink macules, not pruritic, since last month. She has been careful to wear sunscreen.   ROV 02/14/15 --  The patient has completed her study of combination pirfenidone + study drug. She is now back on pirfenidone tid. She had some nausea when she was on both drugs, but this has resolved now. Her FEV1 was stable on serial PFT's through the study. Her last Ct chest was may 2015. Her LFT's were checked by [facility] and were normal.   ROV 10/23/15 -- patient with a history of interstitial lung disease of unclear etiology, evidence of obstruction on spirometry history of hypertension with diastolic dysfunction. She was part of the pirfenidone  study and now is on pirfenidone  3 times a day.she underwent full pulmonary function testing on 5/15 that I have personally reviewed. This showed an FVC of 1.44 L, FEV1 1.22 L without any evidence of significant coexisting obstruction. This is stable compared with her pulmonary function testing last performed on 03/17/15.  Fairly stable all the way back to a year ago.  Her last CT chest was 10/09/15 that I reviewed, very stable interstitial changes going all the way back to 2013.  She was recently treated for a URI + AE, was treated with pred and abx > is now improved. Not on loratadine every day. She believs that she is about teh same. She does get fatigued w exertion.    ROV 01/29/16 -- The patient has a history of idiopathic interstitial lung disease with some coexisting obstructive disease on spirometry. She also has a history of hypertension with diastolic dysfunction. She is being managed on pirfenidone 3 times a day. Her CT scan and symptoms have been for the most part stable. We did an echocardiogram after her last visit That showed a peak PA pressure of 35 mmHg With normal RV size and function.  She uses albuterol about 2x a month. She believes that her breathing is stable. She has exertional fatigue and dyspnea. Allergy  sx > seems to be better controlled on loratadine.   ROV 05/01/16 -- follow-up visit for idiopathic ILD with some coexisting obstruction on spirometry. Currently on pirfenidone. She underwent a 6 minute walk today and completed 444 m without desaturation on room air. She has some fatigue, gets tired and dyspneic after eating. She has some rare cough, some early allergy sx on loratadine. No wheeze. She very rarely uses albuterol. Last CT was 10/2015, stable ILD.        Objective:   Physical Exam There were no vitals filed for this visit. GEN: A/Ox3; pleasant , NAD, well nourished    HEENT:  [place]/AT,  EACs-clear, TMs-wnl, NOSE-clear, THROAT-clear, no lesions, no postnasal drip or exudate noted.   NECK:  Supple w/ fair ROM; no JVD; normal carotid impulses w/o bruits; no thyromegaly or nodules palpated; no lymphadenopathy.    RESP  Bibasilar insp crackles, soft  CARD:  RRR, no m/r/g  , no peripheral edema, pulses intact, no cyanosis or clubbing.  Musco: Warm bil, no deformities or joint swelling noted.   Neuro: alert, no focal deficits noted.    Skin: Warm, no lesions or rashes   10/09/15 --  CT chest --  COMPARISON: 03/20/2015, 11/05/2013, 01/24/2012 and 01/20/2012.  FINDINGS: Mediastinum/Lymph Nodes: Mediastinal lymph nodes measure up to 11 mm in the low right paratracheal station, stable. Hilar regions are difficult to definitively evaluate without IV contrast. No axillary adenopathy. Atherosclerotic calcification of the arterial vasculature, including coronary arteries. Heart is mildly enlarged. No pericardial effusion. Large hiatal hernia.  Lungs/Pleura: Peripheral and basilar predominant pattern of subpleural reticulation, traction bronchiectasis/ bronchiolectasis, scattered ground-glass and mild architectural distortion is likely unchanged from 01/24/2012. Thinner slice collimation on the current exam allows increased resolution and better visualization of the findings.  There is air trapping. No pleural fluid. Airway is unremarkable.  Upper abdomen: Visualized portions of the liver, gallbladder, adrenal glands, kidneys, spleen, pancreas unremarkable. Large hiatal hernia.  Musculoskeletal: No worrisome lytic or sclerotic lesions.  IMPRESSION: 1. Pulmonary parenchymal pattern of fibrosis is likely unchanged from 01/24/2012. Thinner slice collimation on the current exam increases resolution and accentuates the findings when compared with prior examinations. 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 Usual interstitial pneumonitis is not excluded, given the provided history of idiopathic pulmonary fibrosis. 2. Coronary artery calcification. 3. Large hiatal hernia        Assessment & Plan:  IPF (idiopathic pulmonary fibrosis) Your 6 minute walk shows good exercise tolerance, good oxygen levels with exercise.  Continue your pirfenidone as you are taking it Continue loratadine every day.  Keep oxygen available to use 2 puffs up to every 4 hours if needed for shortness of breath.  Labwork today.  Follow with Dr Lamonte Sakai in 6 months or sooner if you have any problems  Baltazar Apo, MD, PhD 05/01/2016, 10:10 AM [facility] [phone] or if no answer [phone]

## 2016-05-01 NOTE — Progress Notes (Signed)
Subjective:    Patient ID: Tina Calderon, female    DOB: Mar 19, 1941, 75 y.o.   MRN: [id number]  HPI 75 yo female never smoker seen for initial pulmonary consult during hospital for acute respiratory failure   [facility] follow up 01/30/12 -  Pt was admitted 2 weeks ago for chest pain and dyspnea.  Found to have Acute respiratory failure with hypoxia--  Tx for possible diastolic CHF with aggressive diuresis Echo showed EF 60% , no mention of diastolic dysfxn.  HRCT consistent with ILD-  CCM consult with autoimmune workup that was unrevealing with neg ANA, ANCA, RA factor , nml ESR , CRP    Does light housework but not able walk long distances due to dyspnea  FH of pulmonary fibrosis -sibling -they were smokers. NO significant desats with walking in cardiology yesterday, desated to 93% on room air.  No cough or wheezing  No edema.   Since discharge she is feeling better with less DOE. But does get winded with walking long distances.   ROV 02/21/12 -- follows up for ILD noted during hospitalization for resp failure. Treated for possible diastolic CHF. CT scan shows some mild basilar fibrotic change. PFT's 9/3 >> probable mixed disease, no BD response, restricted volumes, decrease DLCO that corrects for Va.  She had family hx of ILD.  She had walking oximetry that she says was reassuring. She feels no better today than at hospital discharge.   ROV 03/24/12 -- ILD without clear etiology, mixed dz on PFT 02/11/12. Initiated a trial prednisone last month >> She doesn't believe that her dyspnea has changed.  Last time she did not desat.   ROV 06/23/12 -- ILD without clear etiology, mixed dz on PFT 02/11/12. Trial of pred >> weaned to off. Sent her to pulm rehab at [place], feels that it has benefited her. She isn't as SOB as before. CXR today >> stable bibasilar ILD, hiatal hernia.   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 Also possible component diastolic dysfxn. She has a family hx of ILD. She tells me that her dyspnea is worse compared with last year. She is having some spells of near syncope on exertion. Walked today to exhaustion without desaturation.   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 She has been part of the pirfenidone trial and has been on the medication. She will be transitioning to the commercial drug. Denies any side effects from the medication. I completed financial assistance forms on 04/12/13.  Due for CXR today. Not really doing exercise - she did do pulmonary rehab at [facility].    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 Last CXR 05/19/13, last CT scan 01/24/12. She is still on the pirfenidone trial, receiving study drug. She is fairly stable. Will be converted to the commercial med soon. Tolerates soon.   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 Returns for f/u. She is now on pirfenidone 3 pills tid and is tolerating. She is having more problems with fatigue and dyspnea with exertion - has lay down after eating dinner, with chores. Her synthroid hasn't been adjusted in a long time.  Her last labs were March 3, 15.   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 She is on  pirfenidone 3 pills tid. She has been tolerating well from GI standpoint. TSH normal. She has some fatigue, otherwise doing well. She needs LFT done in 3 months and after that we can go to every 6 months,.   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 On pirfenidone 3 pills tid since August 2014. Last CT scan 5/'15 was stable, described as an NSIP pattern.  LFT were done today, not yet available.  She has noticed some slightly raised pale pink macules, not pruritic, since last month. She has been careful to wear sunscreen.   ROV 02/14/15 --  The patient has completed her study of combination pirfenidone + study drug. She is now back on pirfenidone tid. She had some nausea when she was on both drugs, but this has resolved now. Her FEV1 was stable on serial PFT's through the study. Her last Ct chest was may 2015. Her LFT's were checked by [facility] and were normal.   ROV 10/23/15 -- patient with a history of interstitial lung disease of unclear etiology, evidence of obstruction on spirometry history of hypertension with diastolic dysfunction. She was part of the pirfenidone  study and now is on pirfenidone  3 times a day.she underwent full pulmonary function testing on 5/15 that I have personally reviewed. This showed an FVC of 1.44 L, FEV1 1.22 L without any evidence of significant coexisting obstruction. This is stable compared with her pulmonary function testing last performed on 03/17/15.  Fairly stable all the way back to a year ago.  Her last CT chest was 10/09/15 that I reviewed, very stable interstitial changes going all the way back to 2013.  She was recently treated for a URI + AE, was treated with pred and abx > is now improved. Not on loratadine every day. She believs that she is about teh same. She does get fatigued w exertion.    ROV 01/29/16 -- The patient has a history of idiopathic interstitial lung disease with some coexisting obstructive disease on spirometry. She also has a history of hypertension with diastolic dysfunction. She is being managed on pirfenidone 3 times a day. Her CT scan and symptoms have been for the most part stable. We did an echocardiogram after her last visit That showed a peak PA pressure of 35 mmHg With normal RV size and function.  She uses albuterol about 2x a month. She believes that her breathing is stable. She has exertional fatigue and dyspnea. Allergy sx > seems  to be better controlled on loratadine.   ROV 05/01/16 -- follow-up visit for idiopathic ILD with some coexisting obstruction on spirometry. Currently on pirfenidone. She underwent a 6 minute walk today and completed 444 m without desaturation on room air. She has some fatigue, gets tired and dyspneic after eating. She has some rare cough, some early allergy sx on loratadine. No wheeze. She very rarely uses albuterol. Last CT was 10/2015, stable ILD.        Objective:   Physical Exam Vitals:   05/01/16 0950  BP: 134/84  Pulse: (!) 112  SpO2: 96%  Weight: 139 lb (63 kg)  Height: _0  (1.575 m)    GEN: A/Ox3; pleasant , NAD, well nourished    HEENT:  [place]/AT,  EACs-clear, TMs-wnl, NOSE-clear, THROAT-clear, no lesions, no postnasal drip or exudate noted.   NECK:  Supple w/ fair ROM; no JVD; normal carotid impulses w/o bruits; no thyromegaly or nodules palpated; no lymphadenopathy.    RESP  Bibasilar insp crackles, soft  CARD:  RRR, no m/r/g  , no peripheral edema, pulses intact, no cyanosis or clubbing.  Musco: Warm bil, no deformities or joint swelling noted.   Neuro: alert, no focal deficits noted.    Skin: Warm, no lesions or rashes   10/09/15 --  CT chest --  COMPARISON: 03/20/2015, 11/05/2013, 01/24/2012 and 01/20/2012.  FINDINGS: Mediastinum/Lymph Nodes: Mediastinal lymph nodes measure up to 11 mm in the low right paratracheal station, stable. Hilar regions are difficult to definitively evaluate without IV contrast. No axillary adenopathy. Atherosclerotic calcification of the arterial vasculature, including coronary arteries. Heart is mildly enlarged. No pericardial effusion. Large hiatal hernia.  Lungs/Pleura: Peripheral and basilar predominant pattern of subpleural reticulation, traction bronchiectasis/ bronchiolectasis, scattered ground-glass and mild architectural distortion is likely unchanged from 01/24/2012. Thinner slice collimation on the current exam  allows increased resolution and better visualization of the findings. There is air trapping. No pleural fluid. Airway is unremarkable.  Upper abdomen: Visualized portions of the liver, gallbladder, adrenal glands, kidneys, spleen, pancreas unremarkable. Large hiatal hernia.  Musculoskeletal: No worrisome lytic or sclerotic lesions.  IMPRESSION: 1. Pulmonary parenchymal pattern of fibrosis is likely unchanged from 01/24/2012. Thinner slice collimation on the current exam increases resolution and accentuates the findings when compared with prior examinations. 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 Usual interstitial pneumonitis is not excluded, given the provided history of idiopathic pulmonary fibrosis. 2. Coronary artery calcification. 3. Large hiatal hernia        Assessment & Plan:  IPF (idiopathic pulmonary fibrosis) Your 6 minute walk shows good exercise tolerance, good oxygen levels with exercise.  Continue your pirfenidone as you are taking it Continue loratadine every day.  Keep oxygen available to use 2 puffs up to every 4 hours if needed for shortness of breath.  Labwork today.  Follow with Dr Lamonte Sakai in 6 months or sooner if you have any problems  Baltazar Apo, MD, PhD 05/01/2016, 10:10 AM [facility] [phone] or if no answer [phone]

## 2016-05-06 ENCOUNTER — Telehealth: Payer: Self-pay | Admitting: Emergency Medicine

## 2016-05-06 NOTE — Telephone Encounter (Signed)
Notes Recorded by Collene Gobble, MD on 05/01/2016 at 1:07 PM EST Please inform pt that LFT are normal. Please continue same medications. RSB -------------------------------------------- Message has been left with pt per her request. Nothing further was needed.

## 2016-06-13 ENCOUNTER — Encounter: Payer: Self-pay | Admitting: Internal Medicine

## 2016-06-13 ENCOUNTER — Ambulatory Visit (INDEPENDENT_AMBULATORY_CARE_PROVIDER_SITE_OTHER)
Admission: RE | Admit: 2016-06-13 | Discharge: 2016-06-13 | Disposition: A | Discharge status: Home / Self Care | Payer: Medicare Other | Source: Ambulatory Visit | Attending: Internal Medicine | Admitting: Internal Medicine

## 2016-06-13 ENCOUNTER — Ambulatory Visit (INDEPENDENT_AMBULATORY_CARE_PROVIDER_SITE_OTHER): Payer: Medicare Other | Admitting: Internal Medicine

## 2016-06-13 VITALS — BP 104/62 | HR 106 | Temp 97.8°F | Ht 62.5 in | Wt 136.0 lb

## 2016-06-13 DIAGNOSIS — J45991 Cough variant asthma: Secondary | ICD-10-CM | POA: Insufficient documentation

## 2016-06-13 DIAGNOSIS — R059 Cough, unspecified: Secondary | ICD-10-CM

## 2016-06-13 DIAGNOSIS — J84112 Idiopathic pulmonary fibrosis: Secondary | ICD-10-CM | POA: Diagnosis not present

## 2016-06-13 DIAGNOSIS — R05 Cough: Secondary | ICD-10-CM

## 2016-06-13 MED ORDER — FAMOTIDINE 20 MG PO TABS: ORAL_TABLET | ORAL | Status: DC

## 2016-06-13 MED ORDER — PREDNISONE 10 MG PO TABS: ORAL_TABLET | ORAL | 0 refills | Status: DC

## 2016-06-13 MED ORDER — AZITHROMYCIN 250 MG PO TABS: ORAL_TABLET | ORAL | 0 refills | Status: DC

## 2016-06-13 NOTE — Assessment & Plan Note (Addendum)
Of the three most common causes of chronic cough, only one (GERD)  can actually cause the other two (asthma and post nasal drip syndrome)  and perpetuate the cylce of cough inducing airway trauma, inflammation, heightened sensitivity to reflux which is prompted by the cough itself via a cyclical mechanism.    This may partially respond to steroids and look like asthma and post nasal drainage but never erradicated completely unless the cough and the secondary reflux are eliminated, preferably both at the same time.  While not intuitively obvious, many patients with chronic low grade reflux(note she has large HH) do not cough until there is a secondary insult that disturbs the protective epithelial barrier and exposes sensitive nerve endings.  This can be viral(as is likely the case here)  or direct physical injury such as with an endotracheal tube.   The point is that once this occurs, it is difficult to eliminate using anything but a maximally effective acid suppression regimen at least in the short run, accompanied by an appropriate diet to address non acid GERD.   rec max rx for GERD/ zpak to cover URI / pred x 6 for any inflammatory/allergic upper airway component and f/u as planned for pf  I had an extended discussion with the patient reviewing all relevant studies completed to date and  lasting 15 to 20 minutes of a 25 minute acute  visit  For pt not previously known to me   Each maintenance medication was reviewed in detail including most importantly the difference between maintenance and prns and under what circumstances the prns are to be triggered using an action plan format that is not reflected in the computer generated alphabetically organized AVS.    Please see AVS for specific instructions unique to this visit that I personally wrote and verbalized to the the pt in detail and then reviewed with pt  by my nurse highlighting any  changes in therapy recommended at today's visit to their plan  of care.

## 2016-06-13 NOTE — Progress Notes (Signed)
Subjective:    Patient ID: Tina Calderon, female    DOB: 1940-09-16    MRN: [id number]  Brief patient profile:   76 yo female never smoker seen for initial pulmonary consult during hospital for acute respiratory failure    History of [facility] follow up 01/30/12 -  Pt was admitted 2 weeks prior to [facility]   for chest pain and dyspnea.  Found to have Acute respiratory failure with hypoxia--  Tx for possible diastolic CHF with aggressive diuresis Echo showed EF 60% , no mention of diastolic dysfxn.  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 Currently on pirfenidone. She underwent a 6 minute walk today and completed 444 m without desaturation on room air. She has some fatigue, gets tired and dyspneic after eating. She has some rare cough, some early allergy sx on loratadine. No wheeze. She very rarely uses albuterol. Last CT was 10/2015, stable ILD.  rec Your 6 minute walk shows good exercise tolerance, good oxygen levels with exercise.  Continue your pirfenidone as you are taking it Continue loratadine every day.         06/13/2016 acute extended ov/Demi Trieu re: cough ? etiology Chief Complaint  Patient presents with  . Acute Visit    Pt c/o increased cough for the past 1-2 wks. Cough is non prod. She also c/o sore throat that just started this am.   on omeprazole 20 mg q d one hour ac daily with severe cough acute onset x 2 weeks so severe urinates on herself not assoc with typical uri symptoms and no better with saba (though hfa poor)  Using lots of cough syrup / persistently day > noct and now also c/o sore throat but no fever/ no difficulty swallowing   No obvious day to day or daytime variability or assoc excess/ purulent sputum or mucus plugs or hemoptysis or cp or chest  tightness, subjective wheeze or overt sinus or hb symptoms. No unusual exp hx or h/o childhood pna/ asthma or knowledge of premature birth.  Sleeping ok without nocturnal  or early am exacerbation  of respiratory  c/o's or need for noct saba. Also denies any obvious fluctuation of symptoms with weather or environmental changes or other aggravating or alleviating factors except as outlined above   Current Medications, Allergies, Complete Past Medical History, Past Surgical History, Family History, and Social History were reviewed in [employer] record.  ROS  The following are not active complaints unless bolded sore throat, dysphagia, dental problems, itching, sneezing,  nasal congestion or excess/ purulent secretions, ear ache,   fever, chills, sweats, unintended wt loss, classically pleuritic or exertional cp,  orthopnea pnd or leg swelling, presyncope, palpitations, abdominal pain, anorexia, nausea, vomiting, diarrhea  or change in bowel or bladder habits, change in stools or urine, dysuria,hematuria,  rash, arthralgias, visual complaints, headache, numbness, weakness or ataxia or problems with walking or coordination,  change in mood/affect or memory.                  Objective:   Physical Exam   GEN: A/Ox3; pleasant , NAD, well nourished/ somewhat  rattling cough voluntary maneuver   Wt Readings from Last 3 Encounters:  06/13/16 136 lb (61.7 kg)  05/01/16 139 lb (63  kg)  01/29/16 137 lb (62.1 kg)    Vital signs reviewed  - Note on arrival 02 sats  97% on RA     HEENT:  [place]/AT,  EACs-clear, TMs-wnl, NOSE-clear, THROAT-oropharynx pristine/ no lesions, no postnasal drip or exudate noted.   NECK:  Supple w/ fair ROM; no JVD; normal carotid impulses w/o bruits; no thyromegaly or nodules palpated; no lymphadenopathy.    RESP  Bibasilar insp crackles    CARD:  RRR, no m/r/g  , no peripheral edema, pulses intact, no cyanosis or clubbing.  Musco: Warm bil, no  deformities or joint swelling noted.   Neuro: alert, no focal deficits noted.    Skin: Warm, no lesions or rashes   CXR PA and Lateral:   06/13/2016 :    I personally reviewed images and agree with radiology impression as follows:    Mild enlargement of cardiac silhouette with pulmonary vascular congestion.  Pulmonary fibrosis with LEFT basilar scarring.  Large hiatal hernia.          Assessment & Plan:

## 2016-06-13 NOTE — Progress Notes (Signed)
Spoke with pt and notified of results per Dr. Wert. Pt verbalized understanding and denied any questions. 

## 2016-06-13 NOTE — Assessment & Plan Note (Signed)
CT chest 01/24/12 >areas of ground-glass attenuation,  architectural distortion and some very mild cylindrical traction  bronchiectasis, as well as marked thickening of the  peribronchovascular interstitium.  No evidence of PF flare but lots of airway features that are more upper airway than suggestive of a bronchiectasis mechanism - see cough

## 2016-06-13 NOTE — Patient Instructions (Addendum)
zpak  Prednisone 10 mg take  4 each am x 2 days,   2 each am x 2 days,  1 each am x 2 days and stop   Whenever you start coughing > take 2 omeprazole x 30 min bfore bfast and Pepcid 20 mg ac after supper or  bedtime  GERD (REFLUX)  is an extremely common cause of respiratory symptoms just like yours , many times with no obvious heartburn at all.    It can be treated with medication, but also with lifestyle changes including elevation of the head of your bed (ideally with 6 inch  bed blocks),  Smoking cessation, avoidance of late meals, excessive alcohol, and avoid fatty foods, chocolate, peppermint, colas, red wine, and acidic juices such as orange juice.  NO MINT OR MENTHOL PRODUCTS SO NO COUGH DROPS   USE SUGARLESS CANDY INSTEAD (Jolley ranchers or Stover's or Life Savers) or even ice chips will also do - the key is to swallow to prevent all throat clearing. NO OIL BASED VITAMINS - use powdered substitutes.  Please remember to go to the  x-ray department downstairs for your tests - we will call you with the results when they are available.  For cough > mucinex dm 600 1-2 every 12 hours as needed   Only use your albuterol (ventolinas a rescue medication to be used if you can't catch your breath by resting or doing a relaxed purse lip breathing pattern.   - Ok to use up to 2 puffs  every 4 hours   but call for immediate appointment if use goes up over your usual need

## 2016-06-13 NOTE — Assessment & Plan Note (Signed)
06/13/2016  After extensive coaching HFA effectiveness =    75% from a basline of < 25%   Despite typical trigger, no evidence asthma on today's ov but instructed on approp use for flare should it develop

## 2016-08-02 ENCOUNTER — Other Ambulatory Visit: Payer: Self-pay | Admitting: Internal Medicine

## 2016-08-02 DIAGNOSIS — Z1231 Encounter for screening mammogram for malignant neoplasm of breast: Secondary | ICD-10-CM

## 2016-08-22 ENCOUNTER — Ambulatory Visit
Admission: RE | Admit: 2016-08-22 | Discharge: 2016-08-22 | Disposition: A | Discharge status: Home / Self Care | Payer: Medicare Other | Source: Ambulatory Visit | Attending: Internal Medicine | Admitting: Internal Medicine

## 2016-08-22 DIAGNOSIS — Z1231 Encounter for screening mammogram for malignant neoplasm of breast: Secondary | ICD-10-CM

## 2016-08-22 LAB — HM MAMMOGRAPHY: HM MAMMO: NORMAL (ref 0–4)

## 2016-09-15 ENCOUNTER — Other Ambulatory Visit: Payer: Self-pay | Admitting: Emergency Medicine

## 2016-09-25 ENCOUNTER — Ambulatory Visit (INDEPENDENT_AMBULATORY_CARE_PROVIDER_SITE_OTHER): Payer: Medicare Other | Admitting: Primary Care

## 2016-09-25 ENCOUNTER — Encounter: Payer: Self-pay | Admitting: Primary Care

## 2016-09-25 VITALS — BP 120/78 | HR 92 | Temp 97.8°F | Ht 61.5 in | Wt 134.8 lb

## 2016-09-25 DIAGNOSIS — E039 Hypothyroidism, unspecified: Secondary | ICD-10-CM

## 2016-09-25 DIAGNOSIS — J84112 Idiopathic pulmonary fibrosis: Secondary | ICD-10-CM

## 2016-09-25 DIAGNOSIS — I10 Essential (primary) hypertension: Secondary | ICD-10-CM | POA: Diagnosis not present

## 2016-09-25 DIAGNOSIS — E079 Disorder of thyroid, unspecified: Secondary | ICD-10-CM

## 2016-09-25 LAB — TSH: TSH: 1.16 u[IU]/mL (ref 0.35–4.50)

## 2016-09-25 NOTE — Assessment & Plan Note (Signed)
Stable on HCTZ, continue current regimen.  BMP this Summer during CPE.

## 2016-09-25 NOTE — Progress Notes (Signed)
Pre visit review using our clinic review tool, if applicable. No additional management support is needed unless otherwise documented below in the visit note. 

## 2016-09-25 NOTE — Assessment & Plan Note (Signed)
Managed on levothyroxine 88 mcg.  Check TSH today. She is asymptomatic.

## 2016-09-25 NOTE — Assessment & Plan Note (Signed)
Following with pulmonology, doing well on current regimen.

## 2016-09-25 NOTE — Progress Notes (Signed)
Subjective:    Patient ID: Tina Calderon, female    DOB: [birth year], 76 y.o.   MRN: [id number]  HPI  Tina Calderon is a 76 year old female who presents today to establish care and discuss the problems mentioned below. Will obtain old records. Her last physical was about 1 year ago.   1) Essential Hypertension: Currently managed on HCTZ 25 mg. Her blood pressure in the clinic today is 120/78. She checks her blood pressure at home which runs 120/80's. She denies chest pain, dizziness, lower extremity edema.  2) GERD: Occasional symptoms that began after she started Esbriet. Currently managed on omeprazole 20 mg daily.  3) Hypothyroidism: Currently managed on levothyroxine 88 mcg. Her last TSH was over 1 year ago. She's not had a medication change in years. She denies hair loss, fatigue.  4) Asthma/Allergic Rhinitis/Pulmonary Fibrosis: Currently managed on Esbriet, albuterol inhaler, Claritin. She is currently following with pulmonology every 6 months. She does experience occasional shortness of breath.   Review of Systems  Constitutional: Negative for fatigue.  Respiratory: Negative for shortness of breath.   Cardiovascular: Negative for chest pain and leg swelling.  Gastrointestinal:       Occasional gerd symptoms  Neurological: Negative for dizziness and weakness.       Past Medical History:  Diagnosis Date  . Anginal pain ([place])   . Cancer ([place])   . CHF (congestive heart failure) ([place]) 01/22/2012  . Chronic fatigue 07/15/2014  . Hypertension   . Hypothyroidism   . ILD (interstitial lung disease) ([place]) 02/04/2012  . Mild intermittent asthma 01/29/2016  . Pelvic relaxation   . Prolapse of bladder   . Shortness of breath   . Thyroid disease      Social History   Social History  . Marital status: Married    Spouse name: N/A  . Number of children: N/A  . Years of education: N/A   Occupational History  . Not on file.   Social History Main Topics  . Smoking status: Never  Smoker  . Smokeless tobacco: Never Used  . Alcohol use No  . Drug use: No  . Sexual activity: No     Comment: HYST   Other Topics Concern  . Not on file   Social History Narrative   Married.   2 children, 5 grandchildren.   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   Enjoys puzzles, sewing, traveling, reading.     Past Surgical History:  Procedure Laterality Date  . ANTERIOR AND POSTERIOR REPAIR  12/05/2011   Procedure: ANTERIOR (CYSTOCELE) AND POSTERIOR REPAIR (RECTOCELE);  Surgeon: [name], MD;  Location: [facility];  Service: Gynecology;  Laterality: N/A;  with cysto  . Bladder tact  15 years ago  . LEFT HEART CATHETERIZATION WITH CORONARY ANGIOGRAM N/A 01/22/2012   Procedure: LEFT HEART CATHETERIZATION WITH CORONARY ANGIOGRAM;  Surgeon: [name], MD;  Location: [facility] CATH LAB;  Service: Cardiovascular;  Laterality: N/A;  . NECK SURGERY  2010  . SHOULDER SURGERY    . thyroid disease    . TONSILLECTOMY    . VAGINAL HYSTERECTOMY  15 years ago    Family History  Problem Relation Age of Onset  . Thyroid disease Mother   . Hypertension Mother   . Coronary artery disease Mother   . Aneurysm Father   . Stroke Other   . Sleep apnea Other   . Cancer Brother     LIVER  . Pulmonary fibrosis Brother   .  Pulmonary fibrosis Sister     Allergies  Allergen Reactions  . Dilaudid [Hydromorphone] Nausea And Vomiting    Current Outpatient Prescriptions on File Prior to Visit  Medication Sig Dispense Refill  . albuterol (PROVENTIL HFA;VENTOLIN HFA) 108 (90 BASE) MCG/ACT inhaler Inhale 2 puffs into the lungs every 6 (six) hours as needed for wheezing or shortness of breath. 1 Inhaler 6  . aspirin 81 MG tablet Take 81 mg by mouth daily.    . calcium-vitamin D (OSCAL WITH D) 500-200 MG-UNIT per tablet Take 1 tablet by mouth every morning.    . Cranberry 1000 MG CAPS Take 1 capsule by mouth daily.    . ESBRIET 267 MG CAPS TAKE THREE CAPSULES BY MOUTH THREE TIMES DAILY WITH FOOD. 270  capsule 4  . hydrochlorothiazide (HYDRODIURIL) 25 MG tablet Take 1 tablet (25 mg total) by mouth every morning.    [name] levothyroxine (SYNTHROID, LEVOTHROID) 88 MCG tablet Take 1 tablet by mouth daily.    [name] loratadine (CLARITIN) 10 MG tablet Take 10 mg by mouth every evening. Reported on 10/13/2015    . Multiple Vitamin (MULTIVITAMIN WITH MINERALS) TABS Take 1 tablet by mouth every morning. Centrum silver    . omeprazole (PRILOSEC) 20 MG capsule TAKE 1 CAPSULE (20 MG TOTAL) BY MOUTH DAILY. 90 capsule 3   No current facility-administered medications on file prior to visit.     BP 120/78   Pulse 92   Temp 97.8 F (36.6 C) (Oral)   Ht 5' 1.5" (1.562 m)   Wt 134 lb 12.8 oz (61.1 kg)   SpO2 98%   BMI 25.06 kg/m    Objective:   Physical Exam  Constitutional: She appears well-nourished.  Neck: Neck supple.  Cardiovascular: Normal rate and regular rhythm.   Pulmonary/Chest: Effort normal and breath sounds normal. She has no wheezes.  Skin: Skin is warm and dry.  Psychiatric: She has a normal mood and affect.          Assessment & Plan:

## 2016-09-25 NOTE — Patient Instructions (Signed)
Complete lab work prior to leaving today. I will notify you of your results once received.   Please schedule a physical with me in July at your convenience. You may also schedule a lab only appointment 3-4 days prior. We will discuss your lab results in detail during your physical.  It was a pleasure to meet you today! Please don't hesitate to call me with any questions. Welcome to [facility]!

## 2016-09-27 ENCOUNTER — Encounter: Payer: Self-pay | Admitting: Primary Care

## 2016-10-10 ENCOUNTER — Encounter: Payer: Self-pay | Admitting: Primary Care

## 2016-10-10 ENCOUNTER — Telehealth: Payer: Self-pay | Admitting: Primary Care

## 2016-10-10 ENCOUNTER — Ambulatory Visit (INDEPENDENT_AMBULATORY_CARE_PROVIDER_SITE_OTHER)
Admission: RE | Admit: 2016-10-10 | Discharge: 2016-10-10 | Disposition: A | Discharge status: Home / Self Care | Payer: Medicare Other | Source: Ambulatory Visit | Attending: Primary Care | Admitting: Primary Care

## 2016-10-10 ENCOUNTER — Ambulatory Visit (INDEPENDENT_AMBULATORY_CARE_PROVIDER_SITE_OTHER): Payer: Medicare Other | Admitting: Primary Care

## 2016-10-10 VITALS — BP 122/70 | HR 90 | Temp 98.4°F | Ht 61.5 in | Wt 136.0 lb

## 2016-10-10 DIAGNOSIS — M79672 Pain in left foot: Secondary | ICD-10-CM

## 2016-10-10 DIAGNOSIS — S92322A Displaced fracture of second metatarsal bone, left foot, initial encounter for closed fracture: Secondary | ICD-10-CM

## 2016-10-10 NOTE — Progress Notes (Signed)
Pre visit review using our clinic review tool, if applicable. No additional management support is needed unless otherwise documented below in the visit note. 

## 2016-10-10 NOTE — Patient Instructions (Addendum)
Complete xray(s) prior to leaving today.   You may take Ibuprofen 400-600 mg three times daily as needed for pain and inflammation.   I will be in touch soon! It was a pleasure to see you today!

## 2016-10-10 NOTE — Telephone Encounter (Signed)
See result note and fit her for post op shoe, also please notify her that I'd like to send her to an orthopedist for further evaluation. I've placed the referral.

## 2016-10-10 NOTE — Progress Notes (Signed)
Subjective:    Patient ID: Tina Calderon, female    DOB: [birth year], 76 y.o.   MRN: [id number]  HPI  Tina Calderon is a 76 year old female who presents today with a chief complaint of foot pain. Her pain is located to the medial dorsal left foot with pain to the 3rd and 4th digits. Her pain began 1 month ago after a large can of peaches fell onto her foot. She experienced immediate swelling, redness, and pain just after the incident. Since then she's continued to notice pain and discoloration to her 3rd and 4 th digits. Pain is worse with ambulation.   Review of Systems  Musculoskeletal:       Left foot pain  Skin: Positive for color change.  Neurological: Negative for weakness and numbness.       Past Medical History:  Diagnosis Date  . Anginal pain ([place])   . Cancer ([place])   . CHF (congestive heart failure) ([place]) 01/22/2012  . Chronic fatigue 07/15/2014  . Hypertension   . Hypothyroidism   . ILD (interstitial lung disease) ([place]) 02/04/2012  . IPF (idiopathic pulmonary fibrosis) ([place]) 2013  . Mild intermittent asthma 01/29/2016  . Pelvic relaxation   . Prolapse of bladder   . Shortness of breath   . Thyroid disease      Social History   Social History  . Marital status: Married    Spouse name: N/A  . Number of children: N/A  . Years of education: N/A   Occupational History  . Not on file.   Social History Main Topics  . Smoking status: Never Smoker  . Smokeless tobacco: Never Used  . Alcohol use No  . Drug use: No  . Sexual activity: No     Comment: HYST   Other Topics Concern  . Not on file   Social History Narrative   Married.   2 children, 5 grandchildren.   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   Enjoys puzzles, sewing, traveling, reading.     Past Surgical History:  Procedure Laterality Date  . ANTERIOR AND POSTERIOR REPAIR  12/05/2011   Procedure: ANTERIOR (CYSTOCELE) AND POSTERIOR REPAIR (RECTOCELE);  Surgeon: [name], MD;  Location: [facility];   Service: Gynecology;  Laterality: N/A;  with cysto  . Bladder tact  15 years ago  . LEFT HEART CATHETERIZATION WITH CORONARY ANGIOGRAM N/A 01/22/2012   Procedure: LEFT HEART CATHETERIZATION WITH CORONARY ANGIOGRAM;  Surgeon: [name], MD;  Location: [facility] CATH LAB;  Service: Cardiovascular;  Laterality: N/A;  . NECK SURGERY  2010  . SHOULDER SURGERY    . thyroid disease    . TONSILLECTOMY    . VAGINAL HYSTERECTOMY  15 years ago    Family History  Problem Relation Age of Onset  . Thyroid disease Mother   . Hypertension Mother   . Coronary artery disease Mother   . Dementia Mother   . Aneurysm Father   . Pulmonary fibrosis Sister   . Stroke Other   . Sleep apnea Other   . Cancer Brother     LIVER  . Pulmonary fibrosis Brother     Allergies  Allergen Reactions  . Dilaudid [Hydromorphone] Nausea And Vomiting    Current Outpatient Prescriptions on File Prior to Visit  Medication Sig Dispense Refill  . albuterol (PROVENTIL HFA;VENTOLIN HFA) 108 (90 BASE) MCG/ACT inhaler Inhale 2 puffs into the lungs every 6 (six) hours as needed for wheezing or shortness of breath. 1 Inhaler  6  . aspirin 81 MG tablet Take 81 mg by mouth daily.    . calcium-vitamin D (OSCAL WITH D) 500-200 MG-UNIT per tablet Take 1 tablet by mouth every morning.    . Cranberry 1000 MG CAPS Take 1 capsule by mouth daily.    . ESBRIET 267 MG CAPS TAKE THREE CAPSULES BY MOUTH THREE TIMES DAILY WITH FOOD. 270 capsule 4  . hydrochlorothiazide (HYDRODIURIL) 25 MG tablet Take 1 tablet (25 mg total) by mouth every morning.    [name] levothyroxine (SYNTHROID, LEVOTHROID) 88 MCG tablet Take 1 tablet by mouth daily.    [name] loratadine (CLARITIN) 10 MG tablet Take 10 mg by mouth every evening. Reported on 10/13/2015    . Multiple Vitamin (MULTIVITAMIN WITH MINERALS) TABS Take 1 tablet by mouth every morning. Centrum silver    . omeprazole (PRILOSEC) 20 MG capsule TAKE 1 CAPSULE (20 MG TOTAL) BY MOUTH DAILY. 90 capsule 3   No current  facility-administered medications on file prior to visit.     BP 122/70   Pulse 90   Temp 98.4 F (36.9 C) (Oral)   Ht 5' 1.5" (1.562 m)   Wt 136 lb (61.7 kg)   SpO2 98%   BMI 25.28 kg/m    Objective:   Physical Exam  Constitutional: She appears well-nourished.  Cardiovascular: Normal rate and regular rhythm.   Pulses:      Dorsalis pedis pulses are 2+ on the left side.       Posterior tibial pulses are 2+ on the left side.  Pulmonary/Chest: Effort normal and breath sounds normal.  Musculoskeletal:  Tenderness to left dorsal medial foot, also with tenderness to MCP joint of 3rd and 4th toes. Mild decrease in ROM with extension and flexion.  Skin: Skin is warm and dry.  Mild bluish discoloration to dorsal 3rd and 4th toes.          Assessment & Plan:  Foot Pain:  Located to dorsal left foot as noted above. Exam today with suspicion for at least fracture to toes. Obtain xray today given trauma to rule out any metatarsal involvement. Will set her up for Post Op shoe. Consider ortho referral if necessary. Discussed use of Ibuprofen for pain and inflammation.  Sheral Flow, NP

## 2016-10-11 NOTE — Telephone Encounter (Signed)
Spoken to patient. She has an appointment with orth on 10/14/2016. She will wait on the opt shoe because she wants to wait what they say first.

## 2016-10-14 ENCOUNTER — Encounter (INDEPENDENT_AMBULATORY_CARE_PROVIDER_SITE_OTHER): Payer: Self-pay | Admitting: Orthopedic Surgery

## 2016-10-14 ENCOUNTER — Ambulatory Visit (INDEPENDENT_AMBULATORY_CARE_PROVIDER_SITE_OTHER): Payer: Medicare Other | Admitting: Orthopedic Surgery

## 2016-10-14 DIAGNOSIS — M79672 Pain in left foot: Secondary | ICD-10-CM | POA: Diagnosis not present

## 2016-10-14 NOTE — Progress Notes (Signed)
Office Visit Note   Patient: Tina Calderon           Date of Birth: 1941/02/05           MRN: [id number] Visit Date: 10/14/2016 Requested by: [name], NP [place], [place] [address] PCP: [name], NP  Subjective: Chief Complaint  Patient presents with  . Left Foot - Injury    HPI: [name] is a 56 show patient with left foot pain.  Injured at one month ago when a can of peaches landed on her foot.  Reports most pain in the third and fourth metatarsal region.  The pain is worse with ambulation.  I reviewed her pictures she took of the foot when it happened.  It does shows some bruising over the talonavicular joint as well as the third and fourth metatarsal heads.  She's had prior surgery on that second toe.  She's been taking ibuprofen.  She is not on blood thinners.  Her husband Clair Gulling does not drive so she is very busy with all of these house-related activities.  She's tried wearing tennis shoes but they are too tight.  Current shoes are flats and have very little support but reasonable arches.              ROS: All systems reviewed are negative as they relate to the chief complaint within the history of present illness.  Patient denies  fevers or chills.   Assessment & Plan: Visit Diagnoses:  1. Left foot pain     Plan: Impression is left foot pain with pretty significant wear and tear on the second metatarsal head which looks chronic.  Foot dorsiflexion plantar flexion is intact.  No fracture seen on plain radiographs.  Plan at this time is for heart social plus Pennsaid samples.  I did like her to use a hard sole shoe for a couple weeks to see if that helps.  Could consider further imaging of the forefoot if her symptoms persist.  I'll see her back as needed  Follow-Up Instructions: No Follow-up on file.   Orders:  No orders of the defined types were placed in this encounter.  No orders of the defined types were placed in this encounter.     Procedures: No procedures performed   Clinical Data: No additional findings.  Objective: Vital Signs: There were no vitals taken for this visit.  Physical Exam:   Constitutional: Patient appears well-developed HEENT:  Head: Normocephalic Eyes:EOM are normal Neck: Normal range of motion Cardiovascular: Normal rate Pulmonary/chest: Effort normal Neurologic: Patient is alert Skin: Skin is warm Psychiatric: Patient has normal mood and affect    Ortho Exam: Orthopedic exam demonstrates reasonable gait and alignment.  She has no real bruising currently around the forefoot region.  She has well-healed surgical incision and shortening of the second toe.  Palpable intact nontender anterior to posterior tib peroneal and Achilles tendon with symmetric tibiotalar subtalar transverse tarsal range of motion.  No other masses lymph and appendectomy or skin changes noted in the foot region.  Specialty Comments:  No specialty comments available.  Imaging: No results found.   PMFS History: Patient Active Problem List   Diagnosis Date Noted  . Essential hypertension 09/25/2016  . Cough variant asthma 06/13/2016  . Allergic rhinitis 01/29/2016  . Mild intermittent asthma 01/29/2016  . Cough 01/03/2015  . Research subject 08/03/2014  . Chronic fatigue 07/15/2014  . Research study patient 06/28/2014  . IPF (idiopathic  pulmonary fibrosis) ([place]) 02/04/2012  . Pulmonary infiltrate 01/24/2012  . Orthostatic hypotension 01/24/2012  . Normocytic anemia 01/22/2012  . Hypertriglyceridemia 01/22/2012  . Chest pain syndrome 01/20/2012  . Anemia 01/20/2012  . Thyroid disease   . H/O bladder repair surgery   . Pelvic relaxation 11/11/2011  . ACUTE MAXILLARY SINUSITIS 12/13/2009  . ACUTE ETHMOIDAL SINUSITIS 12/13/2009   Past Medical History:  Diagnosis Date  . Anginal pain ([place])   . Cancer ([place])   . CHF (congestive heart failure) ([place]) 01/22/2012  . Chronic fatigue 07/15/2014  .  Hypertension   . Hypothyroidism   . ILD (interstitial lung disease) ([place]) 02/04/2012  . IPF (idiopathic pulmonary fibrosis) ([place]) 2013  . Mild intermittent asthma 01/29/2016  . Pelvic relaxation   . Prolapse of bladder   . Shortness of breath   . Thyroid disease     Family History  Problem Relation Age of Onset  . Thyroid disease Mother   . Hypertension Mother   . Coronary artery disease Mother   . Dementia Mother   . Aneurysm Father   . Pulmonary fibrosis Sister   . Stroke Other   . Sleep apnea Other   . Cancer Brother     LIVER  . Pulmonary fibrosis Brother     Past Surgical History:  Procedure Laterality Date  . ANTERIOR AND POSTERIOR REPAIR  12/05/2011   Procedure: ANTERIOR (CYSTOCELE) AND POSTERIOR REPAIR (RECTOCELE);  Surgeon: [name], MD;  Location: [facility];  Service: Gynecology;  Laterality: N/A;  with cysto  . Bladder tact  15 years ago  . LEFT HEART CATHETERIZATION WITH CORONARY ANGIOGRAM N/A 01/22/2012   Procedure: LEFT HEART CATHETERIZATION WITH CORONARY ANGIOGRAM;  Surgeon: [name], MD;  Location: [facility] CATH LAB;  Service: Cardiovascular;  Laterality: N/A;  . NECK SURGERY  2010  . SHOULDER SURGERY    . thyroid disease    . TONSILLECTOMY    . VAGINAL HYSTERECTOMY  15 years ago   Social History   Occupational History  . Not on file.   Social History Main Topics  . Smoking status: Never Smoker  . Smokeless tobacco: Never Used  . Alcohol use No  . Drug use: No  . Sexual activity: No     Comment: HYST

## 2016-11-11 ENCOUNTER — Ambulatory Visit (INDEPENDENT_AMBULATORY_CARE_PROVIDER_SITE_OTHER): Payer: Medicare Other | Admitting: Emergency Medicine

## 2016-11-11 ENCOUNTER — Encounter: Payer: Self-pay | Admitting: Emergency Medicine

## 2016-11-11 ENCOUNTER — Ambulatory Visit (INDEPENDENT_AMBULATORY_CARE_PROVIDER_SITE_OTHER)
Admission: RE | Admit: 2016-11-11 | Discharge: 2016-11-11 | Disposition: A | Discharge status: Home / Self Care | Payer: Medicare Other | Source: Ambulatory Visit | Attending: Emergency Medicine | Admitting: Emergency Medicine

## 2016-11-11 ENCOUNTER — Other Ambulatory Visit (INDEPENDENT_AMBULATORY_CARE_PROVIDER_SITE_OTHER): Payer: Medicare Other

## 2016-11-11 VITALS — BP 124/66 | HR 92 | Ht 61.5 in | Wt 136.6 lb

## 2016-11-11 DIAGNOSIS — Z79899 Other long term (current) drug therapy: Secondary | ICD-10-CM | POA: Diagnosis not present

## 2016-11-11 DIAGNOSIS — J84112 Idiopathic pulmonary fibrosis: Secondary | ICD-10-CM

## 2016-11-11 DIAGNOSIS — J849 Interstitial pulmonary disease, unspecified: Secondary | ICD-10-CM

## 2016-11-11 LAB — CBC WITH DIFFERENTIAL/PLATELET
BASOS PCT: 1.1 % (ref 0.0–3.0)
Basophils Absolute: 0.1 10*3/uL (ref 0.0–0.1)
EOS PCT: 3.4 % (ref 0.0–5.0)
Eosinophils Absolute: 0.3 10*3/uL (ref 0.0–0.7)
HEMATOCRIT: 37.4 % (ref 36.0–46.0)
Hemoglobin: 12.2 g/dL (ref 12.0–15.0)
Lymphocytes Relative: 18.3 % (ref 12.0–46.0)
Lymphs Abs: 1.4 10*3/uL (ref 0.7–4.0)
MCHC: 32.5 g/dL (ref 30.0–36.0)
MCV: 82.3 fl (ref 78.0–100.0)
MONO ABS: 0.9 10*3/uL (ref 0.1–1.0)
Monocytes Relative: 11.4 % (ref 3.0–12.0)
Neutro Abs: 5.2 10*3/uL (ref 1.4–7.7)
Neutrophils Relative %: 65.8 % (ref 43.0–77.0)
Platelets: 238 10*3/uL (ref 150.0–400.0)
RBC: 4.55 Mil/uL (ref 3.87–5.11)
RDW: 14 % (ref 11.5–15.5)
WBC: 7.9 10*3/uL (ref 4.0–10.5)

## 2016-11-11 LAB — HEPATIC FUNCTION PANEL
ALT: 10 U/L (ref 0–35)
AST: 15 U/L (ref 0–37)
Albumin: 4.1 g/dL (ref 3.5–5.2)
Alkaline Phosphatase: 60 U/L (ref 39–117)
Bilirubin, Direct: 0.1 mg/dL (ref 0.0–0.3)
TOTAL PROTEIN: 6.8 g/dL (ref 6.0–8.3)
Total Bilirubin: 0.3 mg/dL (ref 0.2–1.2)

## 2016-11-11 NOTE — Assessment & Plan Note (Signed)
Continue your loratadine and omeprazole as you are taking them

## 2016-11-11 NOTE — Assessment & Plan Note (Signed)
We will perform a CXR today.  We will do blood work today.  We will plan to repeat your Ct chest in May 2019, sooner if you have any problems.  Continue pirfenidone as you have been taking it.

## 2016-11-11 NOTE — Patient Instructions (Signed)
We will perform a CXR today.  We will do blood work today.  We will plan to repeat your Ct chest in May 2019, sooner if you have any problems.  Continue pirfenidone as you have been taking it.  Take albuterol 2 puffs up to every 4 hours if needed for shortness of breath.  Continue your loratadine and omeprazole as you are taking them  Follow with Dr Lamonte Sakai in 6 months or sooner if you have any problems

## 2016-11-11 NOTE — Progress Notes (Signed)
Subjective:    Patient ID: Tina Calderon, female    DOB: 07/16/1940, 76 y.o.   MRN: [id number]  HPI 76 yo female never smoker seen for initially as pulmonary consult during hospital for acute respiratory failure 2013    ROV 01/29/16 -- The patient has a history of idiopathic interstitial lung disease with some coexisting obstructive disease on spirometry. She also has a history of hypertension with diastolic dysfunction. She is being managed on pirfenidone 3 times a day. Her CT scan and symptoms have been for the most part stable. We did an echocardiogram after her last visit That showed a peak PA pressure of 35 mmHg With normal RV size and function.  She uses albuterol about 2x a month. She believes that her breathing is stable. She has exertional fatigue and dyspnea. Allergy sx > seems to be better controlled on loratadine.   ROV 05/01/16 -- follow-up visit for idiopathic ILD with some coexisting obstruction on spirometry. Currently on pirfenidone. She underwent a 6 minute walk today and completed 444 m without desaturation on room air. She has some fatigue, gets tired and dyspneic after eating. She has some rare cough, some early allergy sx on loratadine. No wheeze. She very rarely uses albuterol. Last CT was 10/2015, stable ILD.   ROV 11/11/16 -- this follow-up visit for patient with history of idiopathic (autoimmune negative) interstitial lung disease. Has been treated in the past with prednisone without much change in her dyspnea. She also has some coexisting obstruction on spirometry, hypertension with some likely chronic diastolic dysfunction. She has been treated for cough in the setting of allergic rhinitis, GERD with a hiatal hernia. Her most recent CT scan of the chest was 10/09/15 as above. She does not desaturate with ambulation. Currently managed on pirfenidone. Last LFT stable 6 months ago. She has saba to use prn - uses very rarely, last was months ago. She is on loratadine, prilosec. Cough  appears to be stable.       Objective:   Physical Exam Vitals:   11/11/16 0955  BP: 124/66  Pulse: 92  SpO2: 95%  Weight: 136 lb 9.6 oz (62 kg)  Height: 5' 1.5" (1.562 m)    GEN: A/Ox3; pleasant , NAD, well nourished    HEENT:  [place]/AT,  EACs-clear, TMs-wnl, NOSE-clear, THROAT-clear, no lesions, no postnasal drip or exudate noted.   NECK:  Supple w/ fair ROM; no JVD; normal carotid impulses w/o bruits; no thyromegaly or nodules palpated; no lymphadenopathy.    RESP  Bibasilar insp crackles, soft  CARD:  RRR, no m/r/g  , no peripheral edema, pulses intact, no cyanosis or clubbing.  Musco: Warm bil, no deformities or joint swelling noted.   Neuro: alert, no focal deficits noted.    Skin: Warm, no lesions or rashes   10/09/15 --  CT chest --  COMPARISON: 03/20/2015, 11/05/2013, 01/24/2012 and 01/20/2012.  FINDINGS: Mediastinum/Lymph Nodes: Mediastinal lymph nodes measure up to 11 mm in the low right paratracheal station, stable. Hilar regions are difficult to definitively evaluate without IV contrast. No axillary adenopathy. Atherosclerotic calcification of the arterial vasculature, including coronary arteries. Heart is mildly enlarged. No pericardial effusion. Large hiatal hernia.  Lungs/Pleura: Peripheral and basilar predominant pattern of subpleural reticulation, traction bronchiectasis/ bronchiolectasis, scattered ground-glass and mild architectural distortion is likely unchanged from 01/24/2012. Thinner slice collimation on the current exam allows increased resolution and better visualization of the findings. There is air trapping. No pleural fluid. Airway is unremarkable.  Upper abdomen: Visualized  portions of the liver, gallbladder, adrenal glands, kidneys, spleen, pancreas unremarkable. Large hiatal hernia.  Musculoskeletal: No worrisome lytic or sclerotic lesions.  IMPRESSION: 1. Pulmonary parenchymal pattern of fibrosis is likely unchanged from  01/24/2012. Thinner slice collimation on the current exam increases resolution and accentuates the findings when compared with prior examinations. 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 Usual interstitial pneumonitis is not excluded, given the provided history of idiopathic pulmonary fibrosis. 2. Coronary artery calcification. 3. Large hiatal hernia        Assessment & Plan:  IPF (idiopathic pulmonary fibrosis) ([place]) We will perform a CXR today.  We will do blood work today.  We will plan to repeat your Ct chest in May 2019, sooner if you have any problems.  Continue pirfenidone as you have been taking it.   Mild intermittent asthma Take albuterol 2 puffs up to every 4 hours if needed for shortness of breath.   Cough Continue your loratadine and omeprazole as you are taking them    Baltazar Apo, MD, PhD 11/11/2016, 10:22 AM [facility] [phone] or if no answer [phone]

## 2016-11-11 NOTE — Assessment & Plan Note (Signed)
Take albuterol 2 puffs up to every 4 hours if needed for shortness of breath.

## 2016-11-14 ENCOUNTER — Ambulatory Visit (INDEPENDENT_AMBULATORY_CARE_PROVIDER_SITE_OTHER): Payer: Medicare Other | Admitting: Orthopedic Surgery

## 2016-11-20 ENCOUNTER — Other Ambulatory Visit: Payer: Self-pay | Admitting: *Deleted

## 2016-11-20 MED ORDER — PIRFENIDONE 267 MG PO CAPS: ORAL_CAPSULE | ORAL | 3 refills | Status: DC

## 2016-11-21 ENCOUNTER — Ambulatory Visit (INDEPENDENT_AMBULATORY_CARE_PROVIDER_SITE_OTHER): Payer: Medicare Other | Admitting: Orthopedic Surgery

## 2016-11-28 ENCOUNTER — Telehealth: Payer: Self-pay | Admitting: Emergency Medicine

## 2016-11-28 NOTE — Telephone Encounter (Signed)
Pt is requesting CXR results from 11-11-16.  RB please advise. Thanks.

## 2016-11-29 NOTE — Telephone Encounter (Signed)
Please let her know that her CXR is stable compared with her prior films. This is good news.

## 2016-11-29 NOTE — Telephone Encounter (Signed)
Spoke with pt. She is aware of her CXR results. Nothing further was needed.

## 2016-12-01 ENCOUNTER — Other Ambulatory Visit: Payer: Self-pay | Admitting: Primary Care

## 2016-12-01 DIAGNOSIS — E781 Pure hyperglyceridemia: Secondary | ICD-10-CM

## 2016-12-01 DIAGNOSIS — I10 Essential (primary) hypertension: Secondary | ICD-10-CM

## 2016-12-06 ENCOUNTER — Ambulatory Visit (INDEPENDENT_AMBULATORY_CARE_PROVIDER_SITE_OTHER): Payer: Medicare Other

## 2016-12-06 VITALS — BP 110/68 | HR 89 | Temp 97.9°F | Ht 61.5 in | Wt 134.8 lb

## 2016-12-06 DIAGNOSIS — I10 Essential (primary) hypertension: Secondary | ICD-10-CM | POA: Diagnosis not present

## 2016-12-06 DIAGNOSIS — Z0000 Encounter for general adult medical examination without abnormal findings: Secondary | ICD-10-CM | POA: Diagnosis not present

## 2016-12-06 DIAGNOSIS — E781 Pure hyperglyceridemia: Secondary | ICD-10-CM

## 2016-12-06 LAB — BASIC METABOLIC PANEL
BUN: 14 mg/dL (ref 6–23)
CALCIUM: 9.5 mg/dL (ref 8.4–10.5)
CHLORIDE: 101 meq/L (ref 96–112)
CO2: 29 meq/L (ref 19–32)
CREATININE: 1.01 mg/dL (ref 0.40–1.20)
GFR: 56.49 mL/min — ABNORMAL LOW (ref >60.00)
GLUCOSE: 107 mg/dL — AB (ref 70–99)
Potassium: 3.7 mEq/L (ref 3.5–5.1)
Sodium: 138 mEq/L (ref 135–145)

## 2016-12-06 LAB — LIPID PANEL
CHOL/HDL RATIO: 5
Cholesterol: 237 mg/dL — ABNORMAL HIGH (ref 0–200)
HDL: 51.9 mg/dL (ref >39.00)
LDL CALC: 154 mg/dL — AB (ref 0–99)
NONHDL: 184.86
TRIGLYCERIDES: 152 mg/dL — AB (ref 0.0–149.0)
VLDL: 30.4 mg/dL (ref 0.0–40.0)

## 2016-12-06 NOTE — Progress Notes (Signed)
PCP notes:   Health maintenance:  No gaps identified.  Abnormal screenings:   Hearing - failed  Patient concerns:   None  Nurse concerns:  None  Next PCP appt:   12/16/16 @ 1100

## 2016-12-06 NOTE — Progress Notes (Signed)
Subjective:   Tina Calderon is a 76 y.o. female who presents for Medicare Annual (Subsequent) preventive examination.  Review of Systems:  N/A Cardiac Risk Factors include: advanced age (>74mn, >>67women);hypertension     Objective:     Vitals: BP 110/68 (BP Location: Right Arm, Patient Position: Sitting, Cuff Size: Normal)   Pulse 89   Temp 97.9 F (36.6 C) (Oral)   Ht 5' 1.5" (1.562 m) Comment: no shoes  Wt 134 lb 12 oz (61.1 kg)   SpO2 96%   BMI 25.05 kg/m   Body mass index is 25.05 kg/m.   Tobacco History  Smoking Status  . Never Smoker  Smokeless Tobacco  . Never Used     Counseling given: No   Past Medical History:  Diagnosis Date  . Anginal pain ([facility] [place]   . Cancer ([facility]   . CHF (congestive heart failure) ([facility] 01/22/2012  . Chronic fatigue 07/15/2014  . Hypertension   . Hypothyroidism   . ILD (interstitial lung disease) ([facility] 02/04/2012  . IPF (idiopathic pulmonary fibrosis) ([facility] 2013  . Mild intermittent asthma 01/29/2016  . Pelvic relaxation   . Prolapse of bladder   . Shortness of breath   . Thyroid disease    Past Surgical History:  Procedure Laterality Date  . ANTERIOR AND POSTERIOR REPAIR  12/05/2011   Procedure: ANTERIOR (CYSTOCELE) AND POSTERIOR REPAIR (RECTOCELE);  Surgeon: ADelice Lesch MD;  [facility];  Service: Gynecology;  Laterality: N/A;  with cysto  . Bladder tact  15 years ago  . LEFT HEART CATHETERIZATION WITH CORONARY ANGIOGRAM N/A 01/22/2012   Procedure: LEFT HEART CATHETERIZATION WITH CORONARY ANGIOGRAM;  Surgeon: JMinus Breeding MD;  [facility];  Service: Cardiovascular;  Laterality: N/A;  . NECK SURGERY  2010  . SHOULDER SURGERY    . thyroid disease    . TONSILLECTOMY    . VAGINAL HYSTERECTOMY  15 years ago   Family History  Problem Relation Age of Onset  . Thyroid disease Mother   . Hypertension Mother   . Coronary artery disease Mother   . Dementia Mother   . Aneurysm Father   . Pulmonary fibrosis  Sister   . Stroke Other   . Sleep apnea Other   . Cancer Brother        LIVER  . Pulmonary fibrosis Brother    History  Sexual Activity  . Sexual activity: No    Comment: HYST    Outpatient Encounter Prescriptions as of 12/06/2016  Medication Sig  . albuterol (PROVENTIL HFA;VENTOLIN HFA) 108 (90 BASE) MCG/ACT inhaler Inhale 2 puffs into the lungs every 6 (six) hours as needed for wheezing or shortness of breath.  .Marland Kitchenaspirin 81 MG tablet Take 81 mg by mouth daily.  . calcium-vitamin D (OSCAL WITH D) 500-200 MG-UNIT per tablet Take 1 tablet by mouth every morning.  . Cranberry 1000 MG CAPS Take 1 capsule by mouth daily.  . hydrochlorothiazide (HYDRODIURIL) 25 MG tablet Take 1 tablet (25 mg total) by mouth every morning.  .Marland Kitchenlevothyroxine (SYNTHROID, LEVOTHROID) 88 MCG tablet Take 1 tablet by mouth daily.  .Marland Kitchenloratadine (CLARITIN) 10 MG tablet Take 10 mg by mouth every evening. Reported on 10/13/2015  . Multiple Vitamin (MULTIVITAMIN WITH MINERALS) TABS Take 1 tablet by mouth every morning. Centrum silver  . omeprazole (PRILOSEC) 20 MG capsule TAKE 1 CAPSULE (20 MG TOTAL) BY MOUTH DAILY.  .Marland KitchenPirfenidone (ESBRIET) 267 MG CAPS TAKE THREE CAPSULES BY MOUTH THREE TIMES  DAILY WITH FOOD.   No facility-administered encounter medications on file as of 12/06/2016.     Activities of Daily Living In your present state of health, do you have any difficulty performing the following activities: 12/06/2016  Hearing? N  Vision? N  Difficulty concentrating or making decisions? N  Walking or climbing stairs? Y  Dressing or bathing? N  Doing errands, shopping? N  Preparing Food and eating ? N  Using the Toilet? N  In the past six months, have you accidently leaked urine? N  Do you have problems with loss of bowel control? N  Managing your Medications? N  Managing your Finances? N  Housekeeping or managing your Housekeeping? N  Some recent data might be hidden    Patient Care Team: [name], NP as PCP - General (Internal Medicine)    Assessment:     Hearing Screening   125Hz 250Hz 500Hz 1000Hz 2000Hz 3000Hz 4000Hz 6000Hz 8000Hz  Right ear:   0 0 40  0    Left ear:   0 0 40  0    Vision Screening Comments: Last vision approx. 6 mths ago with Dr. Marvel Plan   Exercise Activities and Dietary recommendations Current Exercise Habits: Home exercise routine, Type of exercise: walking, Time (Minutes): 15, Frequency (Times/Week): 3, Weekly Exercise (Minutes/Week): 45, Intensity: Mild, Exercise limited by: None identified  Goals    . Increase physical activity          Starting 12/06/2016, I will continue to walk at least 15 min 3 days per week.       Fall Risk Fall Risk  12/06/2016  Falls in the past year? No   Depression Screen PHQ 2/9 Scores 12/06/2016  PHQ - 2 Score 0     Cognitive Function MMSE - Mini Mental State Exam 12/06/2016  Orientation to time 5  Orientation to Place 5  Registration 3  Attention/ Calculation 0  Recall 3  Language- name 2 objects 0  Language- repeat 1  Language- follow 3 step command 3  Language- read & follow direction 0  Write a sentence 0  Copy design 0  Total score 20     PLEASE NOTE: A Mini-Cog screen was completed. Maximum score is 20. A value of 0 denotes this part of [place] MMSE was not completed or the patient failed this part of the Mini-Cog screening.   Mini-Cog Screening Orientation to Time - Max 5 pts Orientation to Place - Max 5 pts Registration - Max 3 pts Recall - Max 3 pts Language Repeat - Max 1 pts Language Follow 3 Step Command - Max 3 pts     Immunization History  Administered Date(s) Administered  . Influenza Split 02/22/2012, 03/01/2013, 02/08/2014, 01/27/2015  . Influenza Whole 04/11/2011  . Influenza,inj,Quad PF,36+ Mos 03/11/2016  . Pneumococcal Conjugate-13 03/29/2014  . Pneumococcal-Unspecified 04/11/2011, 05/13/2016  . Td 08/30/2008  . Zoster 08/07/2011  . Zoster Recombinat (Shingrix)  09/20/2016   Screening Tests Health Maintenance  Topic Date Due  . INFLUENZA VACCINE  01/08/2017  . TETANUS/TDAP  08/31/2018  . DEXA SCAN  Completed  . PNA vac Low Risk Adult  Completed      Plan:     I have personally reviewed and addressed the Medicare Annual Wellness questionnaire and have noted the following in the patient's chart:  A. Medical and social history B. Use of alcohol, tobacco or illicit drugs  C. Current medications and supplements D. Functional ability and status E.  Nutritional  status F.  Physical activity G. Advance directives H. List of other physicians I.  Hospitalizations, surgeries, and ER visits in previous 12 months J.  [place] to include hearing, vision, cognitive, depression L. Referrals and appointments - none  In addition, I have reviewed and discussed with patient certain preventive protocols, quality metrics, and best practice recommendations. A written personalized care plan for preventive services as well as general preventive health recommendations were provided to patient.  See attached scanned questionnaire for additional information.   Signed,   Lindell Noe, MHA, BS, LPN Health Coach

## 2016-12-06 NOTE — Progress Notes (Signed)
Pre visit review using our clinic review tool, if applicable. No additional management support is needed unless otherwise documented below in the visit note.

## 2016-12-06 NOTE — Patient Instructions (Signed)
Tina Calderon , Thank you for taking time to come for your Medicare Wellness Visit. I appreciate your ongoing commitment to your health goals. Please review the following plan we discussed and let me know if I can assist you in the future.   These are the goals we discussed: Goals    . Increase physical activity          Starting 12/06/2016, I will continue to walk at least 15 min 3 days per week.        This is a list of the screening recommended for you and due dates:  Health Maintenance  Topic Date Due  . Flu Shot  01/08/2017  . Tetanus Vaccine  08/31/2018  . DEXA scan (bone density measurement)  Completed  . Pneumonia vaccines  Completed   Preventive Care for Adults  A healthy lifestyle and preventive care can promote health and wellness. Preventive health guidelines for adults include the following key practices.  . A routine yearly physical is a good way to check with your health care provider about your health and preventive screening. It is a chance to share any concerns and updates on your health and to receive a thorough exam.  . Visit your dentist for a routine exam and preventive care every 6 months. Brush your teeth twice a day and floss once a day. Good oral hygiene prevents tooth decay and gum disease.  . The frequency of eye exams is based on your age, health, family medical history, use  of contact lenses, and other factors. Follow your health care provider's ecommendations for frequency of eye exams.  . Eat a healthy diet. Foods like vegetables, fruits, whole grains, low-fat dairy products, and lean protein foods contain the nutrients you need without too many calories. Decrease your intake of foods high in solid fats, added sugars, and salt. Eat the right amount of calories for you. Get information about a proper diet from your health care provider, if necessary.  . Regular physical exercise is one of the most important things you can do for your health. Most adults  should get at least 150 minutes of moderate-intensity exercise (any activity that increases your heart rate and causes you to sweat) each week. In addition, most adults need muscle-strengthening exercises on 2 or more days a week.  Silver Sneakers may be a benefit available to you. To determine eligibility, you may visit the website: www.silversneakers.com or contact program at (346)238-1043 Mon-Fri between 8AM-8PM.   . Maintain a healthy weight. The body mass index (BMI) is a screening tool to identify possible weight problems. It provides an estimate of body fat based on height and weight. Your health care provider can find your BMI and can help you achieve or maintain a healthy weight.   For adults 20 years and older: ? A BMI below 18.5 is considered underweight. ? A BMI of 18.5 to 24.9 is normal. ? A BMI of 25 to 29.9 is considered overweight. ? A BMI of 30 and above is considered obese.   . Maintain normal blood lipids and cholesterol levels by exercising and minimizing your intake of saturated fat. Eat a balanced diet with plenty of fruit and vegetables. Blood tests for lipids and cholesterol should begin at age 64 and be repeated every 5 years. If your lipid or cholesterol levels are high, you are over 50, or you are at high risk for heart disease, you may need your cholesterol levels checked more frequently. Ongoing high lipid and  cholesterol levels should be treated with medicines if diet and exercise are not working.  . If you smoke, find out from your health care provider how to quit. If you do not use tobacco, please do not start.  . If you choose to drink alcohol, please do not consume more than 2 drinks per day. One drink is considered to be 12 ounces (355 mL) of beer, 5 ounces (148 mL) of wine, or 1.5 ounces (44 mL) of liquor.  . If you are 62-9 years old, ask your health care provider if you should take aspirin to prevent strokes.  . Use sunscreen. Apply sunscreen liberally and  repeatedly throughout the day. You should seek shade when your shadow is shorter than you. Protect yourself by wearing long sleeves, pants, a wide-brimmed hat, and sunglasses year round, whenever you are outdoors.  . Once a month, do a whole body skin exam, using a mirror to look at the skin on your back. Tell your health care provider of new moles, moles that have irregular borders, moles that are larger than a pencil eraser, or moles that have changed in shape or color.

## 2016-12-08 NOTE — Progress Notes (Signed)
I reviewed health advisor's note, was available for consultation, and agree with documentation and plan.  

## 2016-12-09 ENCOUNTER — Other Ambulatory Visit: Payer: Medicare Other

## 2016-12-16 ENCOUNTER — Other Ambulatory Visit: Payer: Self-pay | Admitting: Primary Care

## 2016-12-16 ENCOUNTER — Ambulatory Visit (INDEPENDENT_AMBULATORY_CARE_PROVIDER_SITE_OTHER): Payer: Medicare Other | Admitting: Primary Care

## 2016-12-16 ENCOUNTER — Encounter: Payer: Self-pay | Admitting: Primary Care

## 2016-12-16 VITALS — BP 112/76 | HR 70 | Temp 98.4°F | Ht 61.5 in | Wt 135.8 lb

## 2016-12-16 DIAGNOSIS — E785 Hyperlipidemia, unspecified: Secondary | ICD-10-CM

## 2016-12-16 DIAGNOSIS — R7303 Prediabetes: Secondary | ICD-10-CM

## 2016-12-16 DIAGNOSIS — E781 Pure hyperglyceridemia: Secondary | ICD-10-CM

## 2016-12-16 DIAGNOSIS — R739 Hyperglycemia, unspecified: Secondary | ICD-10-CM | POA: Diagnosis not present

## 2016-12-16 DIAGNOSIS — I10 Essential (primary) hypertension: Secondary | ICD-10-CM

## 2016-12-16 DIAGNOSIS — J84112 Idiopathic pulmonary fibrosis: Secondary | ICD-10-CM | POA: Diagnosis not present

## 2016-12-16 DIAGNOSIS — E079 Disorder of thyroid, unspecified: Secondary | ICD-10-CM

## 2016-12-16 LAB — HEMOGLOBIN A1C: Hgb A1c MFr Bld: 6.3 % (ref 4.6–6.5)

## 2016-12-16 NOTE — Progress Notes (Signed)
Subjective:    Patient ID: Tina Calderon, female    DOB: 01-29-1941, 76 y.o.   MRN: [id number]  HPI  Tina Calderon is a 76 year old female who presents today for [place] Part 2. She saw our health advisor last week. Her mammogram is UTD. She declines PAP and colon cancer screening.   1) Essential Hypertension: Currently managed on HCTZ 25 mg. She denies chest pain, dizziness, changes in vision.  BP Readings from Last 3 Encounters:  12/16/16 112/76  12/06/16 110/68  11/11/16 124/66     2) Hypothyroidism: Currently managed on levothyroxine 88 mcg. Her TSH in April 2018 was normal.   3) Idiopathic Pulmonary Fibrosis: Currently following with pulmonology, last visit in June 2018. Repeat Xray is stable from prior films per pulmonology in June 2018.  4) Hyperlipidemia: Recent lipid panel with TC of 237, LDL of 154. She is not currently exercising.   Diet currently consists of:   Breakfast: Sausage biscuit, fruit, coffee Lunch: Sandwich Dinner: Meat, vegetables Snacks: Yogurt Desserts: Ice cream, 2 times weekly Beverages: Water, diet soda  Exercise: She is not exercising.    Review of Systems  Constitutional: Negative for unexpected weight change.  HENT: Negative for rhinorrhea.   Respiratory: Negative for cough and shortness of breath.   Cardiovascular: Negative for chest pain.  Gastrointestinal: Negative for constipation and diarrhea.  Genitourinary: Negative for difficulty urinating and menstrual problem.  Musculoskeletal: Negative for arthralgias and myalgias.  Skin: Negative for rash.  Allergic/Immunologic: Negative for environmental allergies.  Neurological: Negative for dizziness, numbness and headaches.  Psychiatric/Behavioral:       Denies concerns for anxiety and depression       Past Medical History:  Diagnosis Date  . Anginal pain ([place])   . Cancer ([place])   . CHF (congestive heart failure) ([place]) 01/22/2012  . Chronic fatigue 07/15/2014  . Hypertension   .  Hypothyroidism   . ILD (interstitial lung disease) ([place]) 02/04/2012  . IPF (idiopathic pulmonary fibrosis) ([place]) 2013  . Mild intermittent asthma 01/29/2016  . Pelvic relaxation   . Prolapse of bladder   . Shortness of breath   . Thyroid disease      Social History   Social History  . Marital status: Married    Spouse name: N/A  . Number of children: N/A  . Years of education: N/A   Occupational History  . Not on file.   Social History Main Topics  . Smoking status: Never Smoker  . Smokeless tobacco: Never Used  . Alcohol use No  . Drug use: No  . Sexual activity: No     Comment: HYST   Other Topics Concern  . Not on file   Social History Narrative   Married.   2 children, 5 grandchildren.   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   Enjoys puzzles, sewing, traveling, reading.     Past Surgical History:  Procedure Laterality Date  . ANTERIOR AND POSTERIOR REPAIR  12/05/2011   Procedure: ANTERIOR (CYSTOCELE) AND POSTERIOR REPAIR (RECTOCELE);  Surgeon: [name], MD;  Location: [facility];  Service: Gynecology;  Laterality: N/A;  with cysto  . Bladder tact  15 years ago  . LEFT HEART CATHETERIZATION WITH CORONARY ANGIOGRAM N/A 01/22/2012   Procedure: LEFT HEART CATHETERIZATION WITH CORONARY ANGIOGRAM;  Surgeon: [name], MD;  [facility];  Service: Cardiovascular;  Laterality: N/A;  . NECK SURGERY  2010  . SHOULDER SURGERY    . thyroid disease    .  TONSILLECTOMY    . VAGINAL HYSTERECTOMY  15 years ago    Family History  Problem Relation Age of Onset  . Thyroid disease Mother   . Hypertension Mother   . Coronary artery disease Mother   . Dementia Mother   . Aneurysm Father   . Pulmonary fibrosis Sister   . Stroke Other   . Sleep apnea Other   . Cancer Brother        LIVER  . Pulmonary fibrosis Brother     Allergies  Allergen Reactions  . Dilaudid [Hydromorphone] Nausea And Vomiting    Current Outpatient Prescriptions on File Prior to  Visit  Medication Sig Dispense Refill  . albuterol (PROVENTIL HFA;VENTOLIN HFA) 108 (90 BASE) MCG/ACT inhaler Inhale 2 puffs into the lungs every 6 (six) hours as needed for wheezing or shortness of breath. 1 Inhaler 6  . aspirin 81 MG tablet Take 81 mg by mouth daily.    . calcium-vitamin D (OSCAL WITH D) 500-200 MG-UNIT per tablet Take 1 tablet by mouth every morning.    . Cranberry 1000 MG CAPS Take 1 capsule by mouth daily.    . hydrochlorothiazide (HYDRODIURIL) 25 MG tablet Take 1 tablet (25 mg total) by mouth every morning.    [name] levothyroxine (SYNTHROID, LEVOTHROID) 88 MCG tablet Take 1 tablet by mouth daily.    [name] loratadine (CLARITIN) 10 MG tablet Take 10 mg by mouth every evening. Reported on 10/13/2015    . Multiple Vitamin (MULTIVITAMIN WITH MINERALS) TABS Take 1 tablet by mouth every morning. Centrum silver    . omeprazole (PRILOSEC) 20 MG capsule TAKE 1 CAPSULE (20 MG TOTAL) BY MOUTH DAILY. 90 capsule 3  . Pirfenidone (ESBRIET) 267 MG CAPS TAKE THREE CAPSULES BY MOUTH THREE TIMES DAILY WITH FOOD. 270 capsule 3   No current facility-administered medications on file prior to visit.     BP 112/76   Pulse 70   Temp 98.4 F (36.9 C) (Oral)   Ht 5' 1.5" (1.562 m)   Wt 135 lb 12.8 oz (61.6 kg)   SpO2 98%   BMI 25.24 kg/m    Objective:   Physical Exam  Constitutional: She is oriented to person, place, and time. She appears well-nourished.  Neck: Neck supple.  Cardiovascular: Normal rate and regular rhythm.   Pulmonary/Chest: Effort normal and breath sounds normal.  Abdominal: Soft. Bowel sounds are normal. There is no tenderness.  Neurological: She is alert and oriented to person, place, and time.  Skin: Skin is warm and dry.  Psychiatric: She has a normal mood and affect.          Assessment & Plan:

## 2016-12-16 NOTE — Assessment & Plan Note (Signed)
Recently followed with pulmonology, recent xray unchanged.

## 2016-12-16 NOTE — Assessment & Plan Note (Signed)
Stable on HCTZ 25 mg, continue same.

## 2016-12-16 NOTE — Assessment & Plan Note (Signed)
TC, LDL, and trigs slightly above goal on fasting labs. Will have her work on diet and exercise. Recheck lipids in 6 months.

## 2016-12-16 NOTE — Assessment & Plan Note (Signed)
TSH in April 2018 stable, continue levothyroxine 88 mcg.

## 2016-12-16 NOTE — Patient Instructions (Signed)
Complete lab work prior to leaving today. I will notify you of your results once received.   It's important to improve your diet by reducing consumption of processed snack foods, sweets. Increase consumption of fresh vegetables and fruits, whole grains, water.  Ensure you are drinking 64 ounces of water daily.  Start exercising. You should be getting 150 minutes of exercise weekly. Work up to this slowly.  Schedule a lab only appointment in 6 months to recheck your cholesterol.  Follow up in 1 year for your annual exam or sooner if needed.  It was a pleasure to see you today!

## 2016-12-23 ENCOUNTER — Other Ambulatory Visit: Payer: Self-pay | Admitting: Primary Care

## 2017-02-03 ENCOUNTER — Other Ambulatory Visit: Payer: Self-pay | Admitting: Primary Care

## 2017-04-08 ENCOUNTER — Telehealth: Payer: Self-pay | Admitting: Emergency Medicine

## 2017-04-08 MED ORDER — PIRFENIDONE 267 MG PO CAPS: ORAL_CAPSULE | ORAL | 3 refills | Status: DC

## 2017-04-08 NOTE — Telephone Encounter (Signed)
I will fax to the pharmacy. ATC pt, no answer. Left message in detail letting pt know that this was being done. Nothing further is needed.

## 2017-04-08 NOTE — Telephone Encounter (Signed)
Called pharmacy and they just need an Rx faxed to the number below.   [phone]

## 2017-04-09 ENCOUNTER — Other Ambulatory Visit: Payer: Self-pay | Admitting: *Deleted

## 2017-04-09 MED ORDER — PIRFENIDONE 267 MG PO CAPS: ORAL_CAPSULE | ORAL | 3 refills | Status: DC

## 2017-04-20 ENCOUNTER — Other Ambulatory Visit: Payer: Self-pay | Admitting: Primary Care

## 2017-04-23 ENCOUNTER — Other Ambulatory Visit: Payer: Self-pay | Admitting: Primary Care

## 2017-04-23 ENCOUNTER — Telehealth: Payer: Self-pay | Admitting: Primary Care

## 2017-04-23 NOTE — Telephone Encounter (Signed)
Copied from [place] [id number]. Topic: Quick Communication - See Telephone Encounter >> Apr 23, 2017  3:19 PM Corie Chiquito, [place] wrote: CRM for notification. See Telephone encounter for: Patient needs a refill on her Levothyroxine and would like it sent to the [facility] on [address]  04/23/17.

## 2017-04-23 NOTE — Telephone Encounter (Signed)
Disp Refills Start End   levothyroxine (SYNTHROID, LEVOTHROID) 88 MCG tablet 90 tablet 1 04/21/2017    Sig: TAKE 1 TABLET BY MOUTH EVERY DAY   Sent to pharmacy as: levothyroxine (SYNTHROID, LEVOTHROID) 88 MCG tablet   E-Prescribing Status: Receipt confirmed by pharmacy (04/21/2017 12:24 PM EST)    Called patient back and advised her to check with the pharmacy as it looks like the refill was sent on 04/21/2017

## 2017-05-12 ENCOUNTER — Ambulatory Visit: Payer: Medicare Other | Admitting: Emergency Medicine

## 2017-05-12 ENCOUNTER — Other Ambulatory Visit (INDEPENDENT_AMBULATORY_CARE_PROVIDER_SITE_OTHER): Payer: Medicare Other

## 2017-05-12 ENCOUNTER — Encounter: Payer: Self-pay | Admitting: Emergency Medicine

## 2017-05-12 VITALS — BP 100/58 | HR 100 | Ht 62.0 in | Wt 130.2 lb

## 2017-05-12 DIAGNOSIS — K219 Gastro-esophageal reflux disease without esophagitis: Secondary | ICD-10-CM | POA: Diagnosis not present

## 2017-05-12 DIAGNOSIS — R05 Cough: Secondary | ICD-10-CM | POA: Diagnosis not present

## 2017-05-12 DIAGNOSIS — J4520 Mild intermittent asthma, uncomplicated: Secondary | ICD-10-CM | POA: Diagnosis not present

## 2017-05-12 DIAGNOSIS — Z5181 Encounter for therapeutic drug level monitoring: Secondary | ICD-10-CM

## 2017-05-12 DIAGNOSIS — J84112 Idiopathic pulmonary fibrosis: Secondary | ICD-10-CM | POA: Diagnosis not present

## 2017-05-12 DIAGNOSIS — J301 Allergic rhinitis due to pollen: Secondary | ICD-10-CM

## 2017-05-12 DIAGNOSIS — R059 Cough, unspecified: Secondary | ICD-10-CM

## 2017-05-12 HISTORY — DX: Gastro-esophageal reflux disease without esophagitis: K21.9

## 2017-05-12 LAB — HEPATIC FUNCTION PANEL
ALBUMIN: 4.5 g/dL (ref 3.5–5.2)
ALK PHOS: 59 U/L (ref 39–117)
ALT: 13 U/L (ref 0–35)
AST: 19 U/L (ref 0–37)
Bilirubin, Direct: 0 mg/dL (ref 0.0–0.3)
TOTAL PROTEIN: 7.3 g/dL (ref 6.0–8.3)
Total Bilirubin: 0.3 mg/dL (ref 0.2–1.2)

## 2017-05-12 NOTE — Assessment & Plan Note (Signed)
Clinically stable at this time.  She tolerates pirfenidone without any side effects.  She needs repeat liver function testing today and we will obtained.  Plan for a repeat CT scan of the chest in May 2019 to look for interval progression.  We will also repeat a walking oximetry at her next visit  Please continue your pirfenidone as you have been taking it Lab work today We will plan to repeat your CT scan of the chest without contrast, high-resolution cuts in May 2019, to follow interstitial lung disease Flu shot up-to-date Pneumovax and Prevnar 13 pneumonia shots are up-to-date Follow with Dr Lamonte Sakai in 6 months or sooner if you have any problems

## 2017-05-12 NOTE — Assessment & Plan Note (Signed)
Continue albuterol as needed

## 2017-05-12 NOTE — Assessment & Plan Note (Signed)
Good control with loratadine daily

## 2017-05-12 NOTE — Assessment & Plan Note (Signed)
With contributions from GERD/hiatal hernia, allergic rhinitis

## 2017-05-12 NOTE — Assessment & Plan Note (Signed)
Continue omeprazole as ordered 

## 2017-05-12 NOTE — Progress Notes (Signed)
Subjective:    Patient ID: Tina Calderon, female    DOB: Nov 19, 1940, 76 y.o.   MRN: [id number]  HPI  ROV 01/29/16 -- The patient has a history of idiopathic interstitial lung disease with some coexisting obstructive disease on spirometry. She also has a history of hypertension with diastolic dysfunction. She is being managed on pirfenidone 3 times a day. Her CT scan and symptoms have been for the most part stable. We did an echocardiogram after her last visit That showed a peak PA pressure of 35 mmHg With normal RV size and function.  She uses albuterol about 2x a month. She believes that her breathing is stable. She has exertional fatigue and dyspnea. Allergy sx > seems to be better controlled on loratadine.   ROV 05/01/16 -- follow-up visit for idiopathic ILD with some coexisting obstruction on spirometry. Currently on pirfenidone. She underwent a 6 minute walk today and completed 444 m without desaturation on room air. She has some fatigue, gets tired and dyspneic after eating. She has some rare cough, some early allergy sx on loratadine. No wheeze. She very rarely uses albuterol. Last CT was 10/2015, stable ILD.   ROV 11/11/16 -- this follow-up visit for patient with history of idiopathic (autoimmune negative) interstitial lung disease. Has been treated in the past with prednisone without much change in her dyspnea. She also has some coexisting obstruction on spirometry, hypertension with some likely chronic diastolic dysfunction. She has been treated for cough in the setting of allergic rhinitis, GERD with a hiatal hernia. Her most recent CT scan of the chest was 10/09/15 as above. She does not desaturate with ambulation. Currently managed on pirfenidone. Last LFT stable 6 months ago. She has saba to use prn - uses very rarely, last was months ago. She is on loratadine, prilosec. Cough appears to be stable.   ROV 05/12/17 --Tina Calderon is 76 and has a history of idiopathic interstitial lung disease.  Also  with some coexisting obstructive lung disease based on her pulmonary function testing.  She has chronic cough in the setting of allergic rhinitis and GERD with a hiatal hernia (on loratadine, Prilosec).  We have managed her with pirfenidone, LFTs within normal limits 11/11/16.  Most recent chest x-ray 11/11/16 showed little change in her interstitial lung disease.  We have plan to repeat her CT chest in May 2019.  She believes that her breathing is stable, she gets fatigued w exertion, with eating. Feels too full. No significant cough. Denies any GI side effects.       Objective:   Physical Exam Vitals:   05/12/17 1130  BP: (!) 100/58  Pulse: 100  SpO2: 91%  Weight: 130 lb 3.2 oz (59.1 kg)  Height: _0  (1.575 m)    GEN: A/Ox3; pleasant , NAD, well nourished    HEENT:  [place]/AT,  EACs-clear, TMs-wnl, NOSE-clear, THROAT-clear, no lesions, no postnasal drip or exudate noted.   NECK:  Supple w/ fair ROM; no JVD; normal carotid impulses w/o bruits; no thyromegaly or nodules palpated; no lymphadenopathy.    RESP  Bibasilar insp crackles, soft  CARD:  RRR, no m/r/g  , no peripheral edema, pulses intact, no cyanosis or clubbing.  Musco: Warm bil, no deformities or joint swelling noted.   Neuro: alert, no focal deficits noted.    Skin: Warm, no lesions or rashes   10/09/15 --  CT chest --  COMPARISON: 03/20/2015, 11/05/2013, 01/24/2012 and 01/20/2012.  FINDINGS: Mediastinum/Lymph Nodes: Mediastinal lymph nodes measure up  to 11 mm in the low right paratracheal station, stable. Hilar regions are difficult to definitively evaluate without IV contrast. No axillary adenopathy. Atherosclerotic calcification of the arterial vasculature, including coronary arteries. Heart is mildly enlarged. No pericardial effusion. Large hiatal hernia.  Lungs/Pleura: Peripheral and basilar predominant pattern of subpleural reticulation, traction bronchiectasis/ bronchiolectasis, scattered ground-glass and  mild architectural distortion is likely unchanged from 01/24/2012. Thinner slice collimation on the current exam allows increased resolution and better visualization of the findings. There is air trapping. No pleural fluid. Airway is unremarkable.  Upper abdomen: Visualized portions of the liver, gallbladder, adrenal glands, kidneys, spleen, pancreas unremarkable. Large hiatal hernia.  Musculoskeletal: No worrisome lytic or sclerotic lesions.  IMPRESSION: 1. Pulmonary parenchymal pattern of fibrosis is likely unchanged from 01/24/2012. Thinner slice collimation on the current exam increases resolution and accentuates the findings when compared with prior examinations. 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 Usual interstitial pneumonitis is not excluded, given the provided history of idiopathic pulmonary fibrosis. 2. Coronary artery calcification. 3. Large hiatal hernia        Assessment & Plan:  IPF (idiopathic pulmonary fibrosis) ([place]) Clinically stable at this time.  She tolerates pirfenidone without any side effects.  She needs repeat liver function testing today and we will obtained.  Plan for a repeat CT scan of the chest in May 2019 to look for interval progression.  We will also repeat a walking oximetry at her next visit  Please continue your pirfenidone as you have been taking it Lab work today We will plan to repeat your CT scan of the chest without contrast, high-resolution cuts in May 2019, to follow interstitial lung disease Flu shot up-to-date Pneumovax and Prevnar 13 pneumonia shots are up-to-date Follow with Dr Lamonte Sakai in 6 months or sooner if you have any problems  Cough With contributions from GERD/hiatal hernia, allergic rhinitis  Mild intermittent asthma Continue albuterol as needed  Allergic rhinitis Good control with loratadine daily  GERD (gastroesophageal reflux disease) Continue omeprazole as ordered   Baltazar Apo,  MD, PhD 05/12/2017, 12:26 PM [facility] [phone] or if no answer [phone]

## 2017-05-12 NOTE — Patient Instructions (Addendum)
Please continue your pirfenidone as you have been taking it Lab work today Continue loratadine and omeprazole as you have been taking them We will plan to repeat your CT scan of the chest without contrast, high-resolution cuts in May 2019, to follow interstitial lung disease Flu shot up-to-date Pneumovax and Prevnar 13 pneumonia shots are up-to-date Follow with Dr Lamonte Sakai in 6 months or sooner if you have any problems

## 2017-06-13 ENCOUNTER — Telehealth: Payer: Self-pay

## 2017-06-13 NOTE — Telephone Encounter (Signed)
Received PA request form from [facility] for Esbriet. Form has been completed and placed to RB's cubby for signature.  CTHR has been attached to PA request.   Will route to [place] to f/u on.

## 2017-06-16 NOTE — Telephone Encounter (Signed)
Patient states she has enough Esbriet pills for the 01/09, and on 01/10 (will only 3 out of 9 needed).  She is getting anxious she will run out and is asking for call back.  CB is [phone].

## 2017-06-16 NOTE — Telephone Encounter (Signed)
Ria Comment, please advise if you have received the form for Dr. Lamonte Sakai to sign regarding PA for pt's Esbriet as pt is almost out of the med.  Thanks!

## 2017-06-16 NOTE — Telephone Encounter (Addendum)
PA form is still awaiting RB's signature. He has not been in the office for the past 2 weeks. Since this is time sensitive issue, I have stamped RB's signature to this form and it has been faxed in. Spoke with pt and she is aware of this information. Nothing further is needed at this time.

## 2017-06-17 ENCOUNTER — Telehealth: Payer: Self-pay | Admitting: Emergency Medicine

## 2017-06-17 MED ORDER — PIRFENIDONE 267 MG PO CAPS: ORAL_CAPSULE | ORAL | 11 refills | Status: DC

## 2017-06-17 NOTE — Telephone Encounter (Signed)
Pt calling checking status of urgent PA for esbriet that was faxed in yesterday.  I advised pt that typically it takes more than one day to get the PA approved, but advised I'd check the status of it.    Called [facility] at [phone], PA had not been received.  Initiated urgent PA over the phone.  Approved through 06/09/2018 Auth#:  [id number]   rx resent to specialty pharmacy.  Pt aware of approval.  Nothing further needed.

## 2017-06-18 ENCOUNTER — Other Ambulatory Visit (INDEPENDENT_AMBULATORY_CARE_PROVIDER_SITE_OTHER): Payer: Medicare Other

## 2017-06-18 DIAGNOSIS — E785 Hyperlipidemia, unspecified: Secondary | ICD-10-CM | POA: Diagnosis not present

## 2017-06-18 DIAGNOSIS — R7303 Prediabetes: Secondary | ICD-10-CM

## 2017-06-18 LAB — LIPID PANEL
Cholesterol: 229 mg/dL — ABNORMAL HIGH (ref 0–200)
HDL: 51.7 mg/dL (ref >39.00)
LDL Cholesterol: 138 mg/dL — ABNORMAL HIGH (ref 0–99)
NONHDL: 177.03
Total CHOL/HDL Ratio: 4
Triglycerides: 193 mg/dL — ABNORMAL HIGH (ref 0.0–149.0)
VLDL: 38.6 mg/dL (ref 0.0–40.0)

## 2017-06-18 LAB — HEMOGLOBIN A1C: HEMOGLOBIN A1C: 6.4 % (ref 4.6–6.5)

## 2017-07-15 ENCOUNTER — Other Ambulatory Visit: Payer: Self-pay | Admitting: Emergency Medicine

## 2017-08-20 ENCOUNTER — Other Ambulatory Visit: Payer: Self-pay | Admitting: Primary Care

## 2017-08-20 DIAGNOSIS — Z1231 Encounter for screening mammogram for malignant neoplasm of breast: Secondary | ICD-10-CM

## 2017-09-08 ENCOUNTER — Other Ambulatory Visit: Payer: Self-pay | Admitting: Internal Medicine

## 2017-09-08 ENCOUNTER — Other Ambulatory Visit: Payer: Self-pay | Admitting: Emergency Medicine

## 2017-09-08 ENCOUNTER — Telehealth: Payer: Self-pay | Admitting: Emergency Medicine

## 2017-09-09 MED ORDER — OMEPRAZOLE 20 MG PO CPDR: 20.0000 mg | DELAYED_RELEASE_CAPSULE | Freq: Every day | ORAL | 0 refills | Status: DC

## 2017-09-09 NOTE — Telephone Encounter (Signed)
Called and spoke to patient. Sent in Rx to requested pharmacy. Nothing further is needed.

## 2017-09-18 ENCOUNTER — Ambulatory Visit: Payer: Medicare Other

## 2017-10-04 ENCOUNTER — Other Ambulatory Visit: Payer: Self-pay | Admitting: Primary Care

## 2017-10-07 ENCOUNTER — Ambulatory Visit
Admission: RE | Admit: 2017-10-07 | Discharge: 2017-10-07 | Disposition: A | Discharge status: Home / Self Care | Payer: Medicare Other | Source: Ambulatory Visit | Attending: Primary Care | Admitting: Primary Care

## 2017-10-07 ENCOUNTER — Ambulatory Visit: Payer: Medicare Other

## 2017-10-07 DIAGNOSIS — Z1231 Encounter for screening mammogram for malignant neoplasm of breast: Secondary | ICD-10-CM

## 2017-11-17 ENCOUNTER — Encounter: Payer: Self-pay | Admitting: Emergency Medicine

## 2017-11-17 ENCOUNTER — Other Ambulatory Visit (INDEPENDENT_AMBULATORY_CARE_PROVIDER_SITE_OTHER): Payer: Medicare Other

## 2017-11-17 ENCOUNTER — Ambulatory Visit: Payer: Medicare Other | Admitting: Emergency Medicine

## 2017-11-17 DIAGNOSIS — J301 Allergic rhinitis due to pollen: Secondary | ICD-10-CM | POA: Diagnosis not present

## 2017-11-17 DIAGNOSIS — J84112 Idiopathic pulmonary fibrosis: Secondary | ICD-10-CM | POA: Diagnosis not present

## 2017-11-17 DIAGNOSIS — J4520 Mild intermittent asthma, uncomplicated: Secondary | ICD-10-CM | POA: Diagnosis not present

## 2017-11-17 DIAGNOSIS — K219 Gastro-esophageal reflux disease without esophagitis: Secondary | ICD-10-CM | POA: Diagnosis not present

## 2017-11-17 DIAGNOSIS — J849 Interstitial pulmonary disease, unspecified: Secondary | ICD-10-CM

## 2017-11-17 LAB — HEPATIC FUNCTION PANEL
ALT: 11 U/L (ref 0–35)
AST: 18 U/L (ref 0–37)
Albumin: 4.2 g/dL (ref 3.5–5.2)
Alkaline Phosphatase: 54 U/L (ref 39–117)
BILIRUBIN DIRECT: 0.1 mg/dL (ref 0.0–0.3)
BILIRUBIN TOTAL: 0.3 mg/dL (ref 0.2–1.2)
Total Protein: 7.2 g/dL (ref 6.0–8.3)

## 2017-11-17 LAB — CBC WITH DIFFERENTIAL/PLATELET
BASOS PCT: 0.4 % (ref 0.0–3.0)
Basophils Absolute: 0 10*3/uL (ref 0.0–0.1)
Eosinophils Absolute: 0.1 10*3/uL (ref 0.0–0.7)
Eosinophils Relative: 1.8 % (ref 0.0–5.0)
HCT: 36.6 % (ref 36.0–46.0)
Hemoglobin: 11.8 g/dL — ABNORMAL LOW (ref 12.0–15.0)
Lymphocytes Relative: 17.1 % (ref 12.0–46.0)
Lymphs Abs: 1.3 10*3/uL (ref 0.7–4.0)
MCHC: 32.3 g/dL (ref 30.0–36.0)
MCV: 81.1 fl (ref 78.0–100.0)
MONO ABS: 0.7 10*3/uL (ref 0.1–1.0)
Monocytes Relative: 9.5 % (ref 3.0–12.0)
Neutro Abs: 5.6 10*3/uL (ref 1.4–7.7)
Neutrophils Relative %: 71.2 % (ref 43.0–77.0)
PLATELETS: 277 10*3/uL (ref 150.0–400.0)
RBC: 4.52 Mil/uL (ref 3.87–5.11)
RDW: 14.9 % (ref 11.5–15.5)
WBC: 7.9 10*3/uL (ref 4.0–10.5)

## 2017-11-17 LAB — TSH: TSH: 0.87 u[IU]/mL (ref 0.35–4.50)

## 2017-11-17 NOTE — Progress Notes (Signed)
Subjective:    Patient ID: Tina Calderon, female    DOB: February 16, 1941, 77 y.o.   MRN: [id number]  HPI  ROV 01/29/16 -- The patient has a history of idiopathic interstitial lung disease with some coexisting obstructive disease on spirometry. She also has a history of hypertension with diastolic dysfunction. She is being managed on pirfenidone 3 times a day. Her CT scan and symptoms have been for the most part stable. We did an echocardiogram after her last visit That showed a peak PA pressure of 35 mmHg With normal RV size and function.  She uses albuterol about 2x a month. She believes that her breathing is stable. She has exertional fatigue and dyspnea. Allergy sx > seems to be better controlled on loratadine.   ROV 05/01/16 -- follow-up visit for idiopathic ILD with some coexisting obstruction on spirometry. Currently on pirfenidone. She underwent a 6 minute walk today and completed 444 m without desaturation on room air. She has some fatigue, gets tired and dyspneic after eating. She has some rare cough, some early allergy sx on loratadine. No wheeze. She very rarely uses albuterol. Last CT was 10/2015, stable ILD.   ROV 11/11/16 -- this follow-up visit for patient with history of idiopathic (autoimmune negative) interstitial lung disease. Has been treated in the past with prednisone without much change in her dyspnea. She also has some coexisting obstruction on spirometry, hypertension with some likely chronic diastolic dysfunction. She has been treated for cough in the setting of allergic rhinitis, GERD with a hiatal hernia. Her most recent CT scan of the chest was 10/09/15 as above. She does not desaturate with ambulation. Currently managed on pirfenidone. Last LFT stable 6 months ago. She has saba to use prn - uses very rarely, last was months ago. She is on loratadine, prilosec. Cough appears to be stable.   ROV 05/12/17 --Tina Calderon is 53 and has a history of idiopathic interstitial lung disease.  Also  with some coexisting obstructive lung disease based on her pulmonary function testing.  She has chronic cough in the setting of allergic rhinitis and GERD with a hiatal hernia (on loratadine, Prilosec).  We have managed her with pirfenidone, LFTs within normal limits 11/11/16.  Most recent chest x-ray 11/11/16 showed little change in her interstitial lung disease.  We have plan to repeat her CT chest in May 2019.  She believes that her breathing is stable, she gets fatigued w exertion, with eating. Feels too full. No significant cough. Denies any GI side effects.   ROV 11/17/2017 --77 year old Calderon with a history of idiopathic ILD and some coexisting obstructive disease on spirometry.  She has chronic cough in the setting of a hiatal hernia with GERD, allergic rhinitis.  She has been managed on pirfenidone tid. She believes that she is having fatigue more quickly than our last visit, has more dyspnea after eating a meal. Of note she also has hypothyroidism. She is having exertional SOB, running errands. Has to stop to rest now - a new issue. She is on some omeprazole and loratadine.       Objective:   Physical Exam Vitals:   11/17/17 1011  BP: 112/80  Pulse: 99  SpO2: 98%  Weight: 130 lb (59 kg)  Height: 5' 2.5" (1.588 m)    GEN: A/Ox3; pleasant , NAD, well nourished    HEENT:  [place]/AT,  EACs-clear, TMs-wnl, NOSE-clear, THROAT-clear, no lesions, no postnasal drip Tina exudate noted.   NECK:  No stridor.  RESP  Few scattered B insp squeaks, B basilar insp crackles.   CARD:  RRR, no m/r/g  , no peripheral edema, pulses intact, no cyanosis Tina clubbing.  Musco: Warm bil, no deformities Tina joint swelling noted.   Neuro: alert, no focal deficits noted.    Skin: Warm, no lesions Tina rashes   10/09/15 --  CT chest --  COMPARISON: 03/20/2015, 11/05/2013, 01/24/2012 and 01/20/2012.  FINDINGS: Mediastinum/Lymph Nodes: Mediastinal lymph nodes measure up to 11 mm in the low right paratracheal  station, stable. Hilar regions are difficult to definitively evaluate without IV contrast. No axillary adenopathy. Atherosclerotic calcification of the arterial vasculature, including coronary arteries. Heart is mildly enlarged. No pericardial effusion. Large hiatal hernia.  Lungs/Pleura: Peripheral and basilar predominant pattern of subpleural reticulation, traction bronchiectasis/ bronchiolectasis, scattered ground-glass and mild architectural distortion is likely unchanged from 01/24/2012. Thinner slice collimation on the current exam allows increased resolution and better visualization of the findings. There is air trapping. No pleural fluid. Airway is unremarkable.  Upper abdomen: Visualized portions of the liver, gallbladder, adrenal glands, kidneys, spleen, pancreas unremarkable. Large hiatal hernia.  Musculoskeletal: No worrisome lytic Tina sclerotic lesions.  IMPRESSION: 1. Pulmonary parenchymal pattern of fibrosis is likely unchanged from 01/24/2012. Thinner slice collimation on the current exam increases resolution and accentuates the findings when compared with prior examinations. 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 Usual interstitial pneumonitis is not excluded, given the provided history of idiopathic pulmonary fibrosis. 2. Coronary artery calcification. 3. Large hiatal hernia        Assessment & Plan:  IPF (idiopathic pulmonary fibrosis) ([facility]) Idiopathic interstitial lung disease, appears to be tolerating pirfenidone but has had more exertional dyspnea, is more symptomatic.  Unclear whether her ILD has progressed, she has not had her CT scan of the chest yet.  We will arrange for this now.  She needs a 6-minute walk.  She also needs LFTs and a CBC for her pirfenidone use.  I will check a TSH given her overall functional decline.    GERD (gastroesophageal reflux disease) Continue omeprazole as ordered  Allergic rhinitis Continue  loratadine as ordered  Mild intermittent asthma Continue albuterol as needed   Baltazar Apo, MD, PhD 11/17/2017, 10:35 AM [facility] [phone] Tina if no answer [phone]

## 2017-11-17 NOTE — Addendum Note (Signed)
Addended by: Desmond Dike C on: 11/17/2017 11:42 AM   Modules accepted: Orders

## 2017-11-17 NOTE — Patient Instructions (Addendum)
We will arrange for a repeat 6 minute walk to compare with your prior.  We will obtain your high resolution CT chest Continue pirfenidone three times a day Blood work today.  Continue omeprazole and loratadine as you are taking it.  You can try using chlorpheniramine 24m up to every 6 hours if needed for additional allergies and cough.  Follow with Dr BLamonte Sakainext available after your CT chest and 6 minute walk to review together.

## 2017-11-17 NOTE — Assessment & Plan Note (Signed)
Continue albuterol as needed 

## 2017-11-17 NOTE — Assessment & Plan Note (Signed)
Continue loratadine as ordered

## 2017-11-17 NOTE — Assessment & Plan Note (Signed)
Continue omeprazole as ordered

## 2017-11-17 NOTE — Assessment & Plan Note (Signed)
Idiopathic interstitial lung disease, appears to be tolerating pirfenidone but has had more exertional dyspnea, is more symptomatic.  Unclear whether her ILD has progressed, she has not had her CT scan of the chest yet.  We will arrange for this now.  She needs a 6-minute walk.  She also needs LFTs and a CBC for her pirfenidone use.  I will check a TSH given her overall functional decline.

## 2017-11-28 ENCOUNTER — Ambulatory Visit (INDEPENDENT_AMBULATORY_CARE_PROVIDER_SITE_OTHER)
Admission: RE | Admit: 2017-11-28 | Discharge: 2017-11-28 | Disposition: A | Discharge status: Home / Self Care | Payer: Medicare Other | Source: Ambulatory Visit | Attending: Emergency Medicine | Admitting: Emergency Medicine

## 2017-11-28 DIAGNOSIS — J849 Interstitial pulmonary disease, unspecified: Secondary | ICD-10-CM

## 2017-12-01 ENCOUNTER — Ambulatory Visit (INDEPENDENT_AMBULATORY_CARE_PROVIDER_SITE_OTHER): Payer: Medicare Other | Admitting: *Deleted

## 2017-12-01 ENCOUNTER — Encounter: Payer: Self-pay | Admitting: Emergency Medicine

## 2017-12-01 ENCOUNTER — Ambulatory Visit: Payer: Medicare Other | Admitting: Emergency Medicine

## 2017-12-01 VITALS — BP 130/70 | HR 113 | Ht 62.5 in | Wt 130.0 lb

## 2017-12-01 DIAGNOSIS — R5382 Chronic fatigue, unspecified: Secondary | ICD-10-CM

## 2017-12-01 DIAGNOSIS — J84112 Idiopathic pulmonary fibrosis: Secondary | ICD-10-CM

## 2017-12-01 DIAGNOSIS — J849 Interstitial pulmonary disease, unspecified: Secondary | ICD-10-CM | POA: Diagnosis not present

## 2017-12-01 NOTE — Assessment & Plan Note (Signed)
Her CT scan of the chest is stable compared with 04/2016.  Her walk distance today was further than 05/01/2016.  Hard for me to ascribe her decreased functional capacity to her ILD.  I think there may be a component of deconditioning here.  I will refer her back to pulmonary rehab at [facility] to see if she can recoup functional capacity.  If not then we will have to investigate further.  Consider repeat PFT or CPST

## 2017-12-01 NOTE — Progress Notes (Signed)
Subjective:    Patient ID: Tina Calderon, female    DOB: [birth year], 77 y.o.   MRN: [id number]  HPI  ROV 05/12/17 --Mrs. Branford is 72 and has a history of idiopathic interstitial lung disease.  Also with some coexisting obstructive lung disease based on her pulmonary function testing.  She has chronic cough in the setting of allergic rhinitis and GERD with a hiatal hernia (on loratadine, Prilosec).  We have managed her with pirfenidone, LFTs within normal limits 11/11/16.  Most recent chest x-ray 11/11/16 showed little change in her interstitial lung disease.  We have plan to repeat her CT chest in May 2019.  She believes that her breathing is stable, she gets fatigued w exertion, with eating. Feels too full. No significant cough. Denies any GI side effects.   ROV 11/17/2017 --77 year old woman with a history of idiopathic ILD and some coexisting obstructive disease on spirometry.  She has chronic cough in the setting of a hiatal hernia with GERD, allergic rhinitis.  She has been managed on pirfenidone tid. She believes that she is having fatigue more quickly than our last visit, has more dyspnea after eating a meal. Of note she also has hypothyroidism. She is having exertional SOB, running errands. Has to stop to rest now - a new issue. She is on some omeprazole and loratadine.   ROV 12/01/17 --this follow-up visit for patient with a history of idiopathic interstitial lung disease, some coexisting obstruction based on her spirometry.  She also has a hiatal hernia with GERD and chronic cough.  We have managed her on pirfenidone.  At our last visit she noted that she is had some decreased functional capacity, more dyspnea with exertion. She is still having decreased energy, more fatigue with activity. We checked CBC and TSH, both reassuring. She keeps a slight cough, added chlorpheniramine to loratadine. Remains on omeprazole.    She underwent a 6-minute walk test today and was able to walk 462 m without  desaturation.  Her last in 04/2016 was 472mRepeat CT chest from 11/28/2017 reviewed and shows stable interstitial changes compared with her most recent from 2017.  There is patchy groundglass attenuation with associated septal thickening and peripheral bronchial ectasis without overt honeycomb change.  No effusion, no suspicious nodules.     SIX MIN WALK 12/01/2017 05/01/2016 10/29/2013 12/04/2012 02/21/2012  Medications Levothyroxine, Esbriet, Loratdine, and Omeprazole all taken at 6:45 AM Synthroid 855m, Claritin 1018mPrilosec 32m77maken at 0630. Esbriet 801mg61men at 0800 - - -  Supplimental Oxygen during Test? (L/min) _0   Laps 9 9 - - -  Partial Lap (in Meters) 30 12 - - -  Baseline BP (sitting) 120/78 140/82 - - -  Baseline Heartrate 101 100 - - -  Baseline Dyspnea (Borg Scale) 1 3 - - -  Baseline Fatigue (Borg Scale) 1 2 - - -  Baseline SPO2 98 97 - - -  BP (sitting) 140/76 192/98 - - -  Heartrate 129 139 - - -  Dyspnea (Borg Scale) 3 3 - - -  Fatigue (Borg Scale) 3 4 - - -  SPO2 96 92 - - -  BP (sitting) 132/78 170/92 - - -  Heartrate 106 113 - - -  SPO2 99 98 - - -  Stopped or Paused before Six Minutes No No - - -  Interpretation Hip pain - - - -  Distance Completed 462 444 - - -  Tech Comments: patient was very  anxious as well as emotional before test. Pt ambulated with a steady gait.  - pt only completed 2 laps, stated she was "very short winded"//ldc Pt completed the walk with little difficulties...increased sob with increased heart rate.        Objective:   Physical Exam Vitals:   12/01/17 0925  BP: 130/70  Pulse: (!) 113  SpO2: 97%  Weight: 130 lb (59 kg)  Height: 5' 2.5" (1.588 m)    GEN: A/Ox3; pleasant , NAD, well nourished    HEENT:  [place]/AT,  EACs-clear, TMs-wnl, NOSE-clear, THROAT-clear, no lesions, no postnasal drip or exudate noted.   NECK:  No stridor.    RESP  bibasilar inspiratory crackles  CARD:  RRR, no m/r/g  , no peripheral  edema, pulses intact, no cyanosis or clubbing.  Musco: Warm bil, no deformities or joint swelling noted.   Neuro: alert, no focal deficits noted.    Skin: Warm, no lesions or rashes       Assessment & Plan:  Mild intermittent asthma Not currently on bronchodilators.  She has never truly benefited from these and we have done trials in the past.  Her clinical history is not consistent with asthma but it there may be benefit to trying bronchodilators again at some point if we do not have any other explanation for her fatigability and dyspnea.  Chronic fatigue With dyspnea.  Question whether some of her clinical changes related to her hiatal hernia and associated restriction.  It does not look worse to me on imaging but it certainly may be contributing to her restrictive disease.  IPF (idiopathic pulmonary fibrosis) ([place]) Her CT scan of the chest is stable compared with 04/2016.  Her walk distance today was further than 05/01/2016.  Hard for me to ascribe her decreased functional capacity to her ILD.  I think there may be a component of deconditioning here.  I will refer her back to pulmonary rehab at [facility] to see if she can recoup functional capacity.  If not then we will have to investigate further.  Consider repeat PFT or CPST   Baltazar Apo, MD, PhD 12/01/2017, 10:00 AM [place] [facility] [phone] or if no answer [phone]

## 2017-12-01 NOTE — Assessment & Plan Note (Signed)
With dyspnea.  Question whether some of her clinical changes related to her hiatal hernia and associated restriction.  It does not look worse to me on imaging but it certainly may be contributing to her restrictive disease.

## 2017-12-01 NOTE — Assessment & Plan Note (Signed)
Not currently on bronchodilators.  She has never truly benefited from these and we have done trials in the past.  Her clinical history is not consistent with asthma but it there may be benefit to trying bronchodilators again at some point if we do not have any other explanation for her fatigability and dyspnea.

## 2017-12-01 NOTE — Patient Instructions (Signed)
We will refer you to Pulmonary Rehab at [facility] Please continue pirfenidone as you have been taking it. Your CT scan of the chest and your 6-minute walk distance are stable compared with November 2017. Depending on your breathing and your fatigue after pulmonary rehab we will decide whether any further work-up should be done. Follow with Dr Lamonte Sakai in 3 months or sooner if you have any problems.

## 2017-12-01 NOTE — Progress Notes (Addendum)
SIX MIN WALK 12/01/2017 05/01/2016 10/29/2013 12/04/2012 02/21/2012  Medications Levothyroxine, Esbriet, Loratdine, and Omeprazole all taken at 6:45 AM Synthroid 31mg, Claritin 167m Prilosec 2059mtaken at 0630. Esbriet 801m78mken at 0800 - - -  Supplimental Oxygen during Test? (L/min) _0   Laps 9 9 - - -  Partial Lap (in Meters) 30 12 - - -  Baseline BP (sitting) 120/78 140/82 - - -  Baseline Heartrate 101 100 - - -  Baseline Dyspnea (Borg Scale) 1 3 - - -  Baseline Fatigue (Borg Scale) 1 2 - - -  Baseline SPO2 98 97 - - -  BP (sitting) 140/76 192/98 - - -  Heartrate 129 139 - - -  Dyspnea (Borg Scale) 3 3 - - -  Fatigue (Borg Scale) 3 4 - - -  SPO2 96 92 - - -  BP (sitting) 132/78 170/92 - - -  Heartrate 106 113 - - -  SPO2 99 98 - - -  Stopped or Paused before Six Minutes No No - - -  Interpretation Hip pain - - - -  Distance Completed 462 444 - - -  Tech Comments: patient was very anxious as well as emotional before test. Pt ambulated with a steady gait.  - pt only completed 2 laps, stated she was "very short winded"//ldc Pt completed the walk with little difficulties...increased sob with increased heart rate.

## 2017-12-06 ENCOUNTER — Other Ambulatory Visit: Payer: Self-pay | Admitting: Emergency Medicine

## 2017-12-10 ENCOUNTER — Telehealth: Payer: Self-pay

## 2017-12-10 DIAGNOSIS — I10 Essential (primary) hypertension: Secondary | ICD-10-CM

## 2017-12-10 DIAGNOSIS — E781 Pure hyperglyceridemia: Secondary | ICD-10-CM

## 2017-12-10 DIAGNOSIS — R739 Hyperglycemia, unspecified: Secondary | ICD-10-CM

## 2017-12-10 NOTE — Telephone Encounter (Signed)
Ordering CPE labs per PCP notes.

## 2017-12-15 ENCOUNTER — Ambulatory Visit (INDEPENDENT_AMBULATORY_CARE_PROVIDER_SITE_OTHER): Payer: Medicare Other

## 2017-12-15 DIAGNOSIS — I10 Essential (primary) hypertension: Secondary | ICD-10-CM | POA: Diagnosis not present

## 2017-12-15 DIAGNOSIS — R739 Hyperglycemia, unspecified: Secondary | ICD-10-CM | POA: Diagnosis not present

## 2017-12-15 DIAGNOSIS — E781 Pure hyperglyceridemia: Secondary | ICD-10-CM

## 2017-12-15 LAB — BASIC METABOLIC PANEL
BUN: 12 mg/dL (ref 6–23)
CO2: 30 mEq/L (ref 19–32)
Calcium: 9.4 mg/dL (ref 8.4–10.5)
Chloride: 93 mEq/L — ABNORMAL LOW (ref 96–112)
Creatinine, Ser: 0.95 mg/dL (ref 0.40–1.20)
GFR: 60.47 mL/min (ref >60.00)
Glucose, Bld: 103 mg/dL — ABNORMAL HIGH (ref 70–99)
POTASSIUM: 4.1 meq/L (ref 3.5–5.1)
SODIUM: 131 meq/L — AB (ref 135–145)

## 2017-12-15 LAB — LIPID PANEL
Cholesterol: 239 mg/dL — ABNORMAL HIGH (ref 0–200)
HDL: 55.1 mg/dL (ref >39.00)
LDL CALC: 149 mg/dL — AB (ref 0–99)
NONHDL: 183.84
Total CHOL/HDL Ratio: 4
Triglycerides: 175 mg/dL — ABNORMAL HIGH (ref 0.0–149.0)
VLDL: 35 mg/dL (ref 0.0–40.0)

## 2017-12-15 LAB — HEMOGLOBIN A1C: Hgb A1c MFr Bld: 6.4 % (ref 4.6–6.5)

## 2017-12-15 NOTE — Progress Notes (Signed)
Medical screening examination/treatment/procedure(s) were performed by a registered nurse and as supervising non-physician practitioner, I was immediately available for consultation/collaboration.   Webb Silversmith, NP

## 2017-12-15 NOTE — Progress Notes (Signed)
PCP notes:   Health maintenance:  No gaps identified.   Abnormal screenings:   Hearing - failed  Hearing Screening   _0  _1  _2  _3  _4  _5  _6  _7  _8   Right ear:   0 0 40  0    Left ear:   40 40 40  40     Patient concerns:   None  Nurse concerns:  None  Next PCP appt:   12/18/17 @ 1015

## 2017-12-15 NOTE — Patient Instructions (Signed)
Tina Calderon , Thank you for taking time to come for your Medicare Wellness Visit. I appreciate your ongoing commitment to your health goals. Please review the following plan we discussed and let me know if I can assist you in the future.   These are the goals we discussed: Goals    . Patient Stated     Starting 12/15/2017, I will continue to take medications as prescribed.        This is a list of the screening recommended for you and due dates:  Health Maintenance  Topic Date Due  . Flu Shot  01/08/2018  . Tetanus Vaccine  11/17/2027  . DEXA scan (bone density measurement)  Completed  . Pneumonia vaccines  Completed   Preventive Care for Adults  A healthy lifestyle and preventive care can promote health and wellness. Preventive health guidelines for adults include the following key practices.  . A routine yearly physical is a good way to check with your health care provider about your health and preventive screening. It is a chance to share any concerns and updates on your health and to receive a thorough exam.  . Visit your dentist for a routine exam and preventive care every 6 months. Brush your teeth twice a day and floss once a day. Good oral hygiene prevents tooth decay and gum disease.  . The frequency of eye exams is based on your age, health, family medical history, use  of contact lenses, and other factors. Follow your health care provider's recommendations for frequency of eye exams.  . Eat a healthy diet. Foods like vegetables, fruits, whole grains, low-fat dairy products, and lean protein foods contain the nutrients you need without too many calories. Decrease your intake of foods high in solid fats, added sugars, and salt. Eat the right amount of calories for you. Get information about a proper diet from your health care provider, if necessary.  . Regular physical exercise is one of the most important things you can do for your health. Most adults should get at least 150  minutes of moderate-intensity exercise (any activity that increases your heart rate and causes you to sweat) each week. In addition, most adults need muscle-strengthening exercises on 2 or more days a week.  Silver Sneakers may be a benefit available to you. To determine eligibility, you may visit the website: www.silversneakers.com or contact program at 620-412-1886 Mon-Fri between 8AM-8PM.   . Maintain a healthy weight. The body mass index (BMI) is a screening tool to identify possible weight problems. It provides an estimate of body fat based on height and weight. Your health care provider can find your BMI and can help you achieve or maintain a healthy weight.   For adults 20 years and older: ? A BMI below 18.5 is considered underweight. ? A BMI of 18.5 to 24.9 is normal. ? A BMI of 25 to 29.9 is considered overweight. ? A BMI of 30 and above is considered obese.   . Maintain normal blood lipids and cholesterol levels by exercising and minimizing your intake of saturated fat. Eat a balanced diet with plenty of fruit and vegetables. Blood tests for lipids and cholesterol should begin at age 30 and be repeated every 5 years. If your lipid or cholesterol levels are high, you are over 50, or you are at high risk for heart disease, you may need your cholesterol levels checked more frequently. Ongoing high lipid and cholesterol levels should be treated with medicines if diet and exercise  are not working.  . If you smoke, find out from your health care provider how to quit. If you do not use tobacco, please do not start.  . If you choose to drink alcohol, please do not consume more than 2 drinks per day. One drink is considered to be 12 ounces (355 mL) of beer, 5 ounces (148 mL) of wine, or 1.5 ounces (44 mL) of liquor.  . If you are 61-51 years old, ask your health care provider if you should take aspirin to prevent strokes.  . Use sunscreen. Apply sunscreen liberally and repeatedly throughout  the day. You should seek shade when your shadow is shorter than you. Protect yourself by wearing long sleeves, pants, a wide-brimmed hat, and sunglasses year round, whenever you are outdoors.  . Once a month, do a whole body skin exam, using a mirror to look at the skin on your back. Tell your health care provider of new moles, moles that have irregular borders, moles that are larger than a pencil eraser, or moles that have changed in shape or color.

## 2017-12-15 NOTE — Progress Notes (Signed)
Subjective:   Tina Calderon is a 77 y.o. female who presents for Medicare Annual (Subsequent) preventive examination.  Review of Systems:  N/A Cardiac Risk Factors include: advanced age (>49mn, >>39women);hypertension     Objective:     Vitals: BP 102/76 (BP Location: Right Arm, Patient Position: Sitting, Cuff Size: Normal)   Pulse 83   Temp 98.4 F (36.9 C) (Oral)   Ht 5' 2.75" (1.594 m) Comment: shoes  Wt 128 lb 12 oz (58.4 kg)   SpO2 97%   BMI 22.99 kg/m   Body mass index is 22.99 kg/m.  Advanced Directives 12/15/2017 12/06/2016 10/13/2015 01/20/2012 12/05/2011 11/29/2011  Does Patient Have a Medical Advance Directive? Yes Yes Yes Patient has advance directive, copy in chart;Patient has advance directive, copy not in chart Patient has advance directive, copy not in chart Patient has advance directive, copy not in chart  Type of Advance Directive [place] will HGrand Falls PlazaLiving will HOroville EastLiving will Living will Living will Living will  Copy of [place] Chart? No - copy requested No - copy requested - Copy requested from family - -  Pre-existing out of facility DNR order (yellow form or pink MOST form) - - - No No -    Tobacco Social History   Tobacco Use  Smoking Status Never Smoker  Smokeless Tobacco Never Used     Counseling given: No   Clinical Intake:  Pre-visit preparation completed: Yes  Pain : No/denies pain Pain Score: 0-No pain     Nutritional Status: BMI 25 -29 Overweight Nutritional Risks: None Diabetes: No  How often do you need to have someone help you when you read instructions, pamphlets, or other written materials from your doctor or pharmacy?: 1 - Never What is the last grade level you completed in school?: Associates degree  Interpreter Needed?: No  Comments: pt lives with spouse Information entered by :: LPinson, LPN  Past Medical History:    Diagnosis Date  . Anginal pain ([place]   . Cancer ([place]   . CHF (congestive heart failure) ([place] 01/22/2012  . Chronic fatigue 07/15/2014  . GERD (gastroesophageal reflux disease) 05/12/2017  . Hypertension   . Hypothyroidism   . ILD (interstitial lung disease) ([place] 02/04/2012  . IPF (idiopathic pulmonary fibrosis) (HS[place] 2013  . Mild intermittent asthma 01/29/2016  . Pelvic relaxation   . Prolapse of bladder   . Shortness of breath   . Thyroid disease    Past Surgical History:  Procedure Laterality Date  . ANTERIOR AND POSTERIOR REPAIR  12/05/2011   Procedure: ANTERIOR (CYSTOCELE) AND POSTERIOR REPAIR (RECTOCELE);  Surgeon: ADelice Lesch MD;  [facility];  Service: Gynecology;  Laterality: N/A;  with cysto  . Bladder tact  15 years ago  . LEFT HEART CATHETERIZATION WITH CORONARY ANGIOGRAM N/A 01/22/2012   Procedure: LEFT HEART CATHETERIZATION WITH CORONARY ANGIOGRAM;  Surgeon: JMinus Breeding MD;  [facility];  Service: Cardiovascular;  Laterality: N/A;  . NECK SURGERY  2010  . SHOULDER SURGERY    . thyroid disease    . TONSILLECTOMY    . VAGINAL HYSTERECTOMY  15 years ago   Family History  Problem Relation Age of Onset  . Thyroid disease Mother   . Hypertension Mother   . Coronary artery disease Mother   . Dementia Mother   . Aneurysm Father   . Pulmonary fibrosis Sister   . Stroke Other   .  Sleep apnea Other   . Cancer Brother        LIVER  . Pulmonary fibrosis Brother    Social History   Socioeconomic History  . Marital status: Married    Spouse name: Not on file  . Number of children: Not on file  . Years of education: Not on file  . Highest education level: Not on file  Occupational History  . Not on file  Social Needs  . Financial resource strain: Not on file  . Food insecurity:    Worry: Not on file    Inability: Not on file  . Transportation needs:    Medical: Not on file    Non-medical: Not on file  Tobacco Use  . Smoking status:  Never Smoker  . Smokeless tobacco: Never Used  Substance and Sexual Activity  . Alcohol use: No  . Drug use: No  . Sexual activity: Never    Birth control/protection: Post-menopausal, Surgical    Comment: HYST  Lifestyle  . Physical activity:    Days per week: Not on file    Minutes per session: Not on file  . Stress: Not on file  Relationships  . Social connections:    Talks on phone: Not on file    Gets together: Not on file    Attends religious service: Not on file    Active member of club or organization: Not on file    Attends meetings of clubs or organizations: Not on file    Relationship status: Not on file  Other Topics Concern  . Not on file  Social History Narrative   Married.   2 children, 5 grandchildren.   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   Enjoys puzzles, sewing, traveling, reading.     Outpatient Encounter Medications as of 12/15/2017  Medication Sig  . albuterol (PROVENTIL HFA;VENTOLIN HFA) 108 (90 BASE) MCG/ACT inhaler Inhale 2 puffs into the lungs every 6 (six) hours as needed for wheezing or shortness of breath.  [name] aspirin 81 MG tablet Take 81 mg by mouth daily.  . calcium-vitamin D (OSCAL WITH D) 500-200 MG-UNIT per tablet Take 1 tablet by mouth every morning.  . Cranberry 1000 MG CAPS Take 1 capsule by mouth daily.  . ESBRIET 267 MG CAPS TAKE 3 CAPSULES BY MOUTH THREE TIMES DAILY WITH FOOD  . hydrochlorothiazide (HYDRODIURIL) 25 MG tablet TAKE ONE TABLET BY MOUTH EVERY DAY FOR BLOOD PRESSURE  . levothyroxine (SYNTHROID, LEVOTHROID) 88 MCG tablet TAKE 1 TABLET BY MOUTH EVERY DAY  . loratadine (CLARITIN) 10 MG tablet Take 10 mg by mouth every evening. Reported on 10/13/2015  . Multiple Vitamin (MULTIVITAMIN WITH MINERALS) TABS Take 1 tablet by mouth every morning. Centrum silver  . omeprazole (PRILOSEC) 20 MG capsule TAKE 1 CAPSULE (20 MG TOTAL) BY MOUTH DAILY.  . [DISCONTINUED] omeprazole (PRILOSEC) 20 MG capsule Take 1 capsule (20 mg total) by mouth daily.    No facility-administered encounter medications on file as of 12/15/2017.     Activities of Daily Living In your present state of health, do you have any difficulty performing the following activities: 12/15/2017  Hearing? N  Vision? N  Difficulty concentrating or making decisions? N  Walking or climbing stairs? Y  Dressing or bathing? N  Doing errands, shopping? N  Preparing Food and eating ? N  Using the Toilet? N  In the past six months, have you accidently leaked urine? N  Do you have problems with loss of bowel control? N  Managing your Medications? N  Managing your Finances? N  Housekeeping or managing your Housekeeping? N  Some recent data might be hidden    Patient Care Team: [name], NP as PCP - General (Internal Medicine)    Assessment:   This is a routine wellness examination for [place].   Hearing Screening   _0  _1  _2  _3  _4  _5  _6  _7  _8   Right ear:   0 0 40  0    Left ear:   40 40 40  40    Vision Screening Comments: Vision exam in Sept 2018 with Dr. Marvel Plan    Exercise Activities and Dietary recommendations Current Exercise Habits: The patient does not participate in regular exercise at present, Exercise limited by: None identified  Goals    . Patient Stated     Starting 12/15/2017, I will continue to take medications as prescribed.        Fall Risk Fall Risk  12/15/2017 12/06/2016  Falls in the past year? No No   Depression Screen PHQ 2/9 Scores 12/15/2017 12/06/2016  PHQ - 2 Score 0 0  PHQ- 9 Score 0 -     Cognitive Function MMSE - Mini Mental State Exam 12/15/2017 12/06/2016  Orientation to time 5 5  Orientation to Place 5 5  Registration 3 3  Attention/ Calculation 0 0  Recall 3 3  Language- name 2 objects 0 0  Language- repeat 1 1  Language- follow 3 step command 3 3  Language- read & follow direction 0 0  Write a sentence 0 0  Copy design 0 0  Total score 20 20        Immunization History   Administered Date(s) Administered  . Influenza Split 02/22/2012, 03/01/2013, 02/08/2014, 01/27/2015  . Influenza Whole 04/11/2011, 02/10/2017  . Influenza, High Dose Seasonal PF 02/16/2017  . Influenza,inj,Quad PF,6+ Mos 03/11/2016  . Pneumococcal Conjugate-13 03/29/2014  . Pneumococcal-Unspecified 04/11/2011, 05/13/2016  . Td 08/30/2008  . Tdap 11/16/2017  . Zoster 08/07/2011  . Zoster Recombinat (Shingrix) 09/20/2016    Screening Tests Health Maintenance  Topic Date Due  . INFLUENZA VACCINE  01/08/2018  . TETANUS/TDAP  11/17/2027  . DEXA SCAN  Completed  . PNA vac Low Risk Adult  Completed       Plan:     I have personally reviewed, addressed, and noted the following in the patient's chart:  A. Medical and social history B. Use of alcohol, tobacco or illicit drugs  C. Current medications and supplements D. Functional ability and status E.  Nutritional status F.  Physical activity G. Advance directives H. List of other physicians I.  Hospitalizations, surgeries, and ER visits in previous 12 months J.  [place] to include hearing, vision, cognitive, depression L. Referrals and appointments - none  In addition, I have reviewed and discussed with patient certain preventive protocols, quality metrics, and best practice recommendations. A written personalized care plan for preventive services as well as general preventive health recommendations were provided to patient.  See attached scanned questionnaire for additional information.   Signed,   Lindell Noe, MHA, BS, LPN Health Coach

## 2017-12-18 ENCOUNTER — Ambulatory Visit (INDEPENDENT_AMBULATORY_CARE_PROVIDER_SITE_OTHER): Payer: Medicare Other | Admitting: Primary Care

## 2017-12-18 ENCOUNTER — Encounter: Payer: Self-pay | Admitting: Primary Care

## 2017-12-18 VITALS — BP 110/60 | HR 89 | Temp 97.9°F | Ht 62.75 in | Wt 129.8 lb

## 2017-12-18 DIAGNOSIS — J4520 Mild intermittent asthma, uncomplicated: Secondary | ICD-10-CM | POA: Diagnosis not present

## 2017-12-18 DIAGNOSIS — J84112 Idiopathic pulmonary fibrosis: Secondary | ICD-10-CM

## 2017-12-18 DIAGNOSIS — K219 Gastro-esophageal reflux disease without esophagitis: Secondary | ICD-10-CM | POA: Diagnosis not present

## 2017-12-18 DIAGNOSIS — I10 Essential (primary) hypertension: Secondary | ICD-10-CM | POA: Diagnosis not present

## 2017-12-18 DIAGNOSIS — E079 Disorder of thyroid, unspecified: Secondary | ICD-10-CM | POA: Diagnosis not present

## 2017-12-18 DIAGNOSIS — Z0000 Encounter for general adult medical examination without abnormal findings: Secondary | ICD-10-CM | POA: Insufficient documentation

## 2017-12-18 DIAGNOSIS — Z0001 Encounter for general adult medical examination with abnormal findings: Secondary | ICD-10-CM | POA: Diagnosis not present

## 2017-12-18 DIAGNOSIS — R7303 Prediabetes: Secondary | ICD-10-CM

## 2017-12-18 DIAGNOSIS — E785 Hyperlipidemia, unspecified: Secondary | ICD-10-CM

## 2017-12-18 DIAGNOSIS — E871 Hypo-osmolality and hyponatremia: Secondary | ICD-10-CM

## 2017-12-18 MED ORDER — ROSUVASTATIN CALCIUM 5 MG PO TABS: ORAL_TABLET | ORAL | 3 refills | Status: DC

## 2017-12-18 NOTE — Progress Notes (Signed)
Subjective:    Patient ID: Tina Calderon, female    DOB: 07-13-1940, 77 y.o.   MRN: [id number]  HPI  Tina Calderon is a 77 year old female who presents today for complete physical.  Immunizations: -Tetanus: Completed in 2019 -Influenza: Completed last season -Pneumonia: Completed Prevnar in 2015, Pneumovax in 2019 -Shingles: completed Shingrix in 2018  Diet: She endorses a healthy diet Breakfast: Cereal, fruit, toast, grits Lunch: Sandwich, salad Dinner: Chicken, pork chops, vegetables, meatloaf, starch Snacks: None Desserts: Occasionally, 1-2 times weekly Beverages: Water, diet tea, coffee  Exercise: She is not exercising due to shortness of breath Eye exam: Due in September Dental exam: No recent exam Colonoscopy: Completed in 2006 Dexa: No recent exam, declines until 2020 Mammogram: Completed in April 2019  The 10-year ASCVD risk score Tina Calderon Tina Calderon) is: 21.6%   Values used to calculate the score:     Age: 11 years     Sex: Female     Is Non-Hispanic African American: No     Diabetic: No     Tobacco smoker: No     Systolic Blood Pressure: 932 mmHg     Is BP treated: Yes     HDL Cholesterol: 55.1 mg/dL     Total Cholesterol: 239 mg/dL  BP Readings from Last 3 Encounters:  12/18/17 110/60  12/15/17 102/76  12/01/17 130/70     Review of Systems  Constitutional: Negative for unexpected weight change.  HENT: Negative for rhinorrhea.   Respiratory: Negative for cough.        Chronic shortness of breath with exertion   Cardiovascular: Negative for chest pain.  Gastrointestinal: Negative for constipation and diarrhea.  Genitourinary: Negative for difficulty urinating.  Musculoskeletal: Negative for arthralgias and myalgias.  Skin: Negative for rash.  Allergic/Immunologic: Negative for environmental allergies.  Neurological: Negative for dizziness, numbness and headaches.  Psychiatric/Behavioral: The patient is not nervous/anxious.        Past  Medical History:  Diagnosis Date  . Anginal pain ([place])   . Cancer ([place])   . CHF (congestive heart failure) ([place]) 8/14/Calderon  . Chronic fatigue 07/15/2014  . GERD (gastroesophageal reflux disease) 05/12/2017  . Hypertension   . Hypothyroidism   . ILD (interstitial lung disease) ([place]) 8/27/Calderon  . IPF (idiopathic pulmonary fibrosis) ([place]) Calderon  . Mild intermittent asthma 01/29/2016  . Pelvic relaxation   . Prolapse of bladder   . Shortness of breath   . Thyroid disease      Social History   Socioeconomic History  . Marital status: Married    Spouse name: Not on file  . Number of children: Not on file  . Years of education: Not on file  . Highest education level: Not on file  Occupational History  . Not on file  Social Needs  . Financial resource strain: Not on file  . Food insecurity:    Worry: Not on file    Inability: Not on file  . Transportation needs:    Medical: Not on file    Non-medical: Not on file  Tobacco Use  . Smoking status: Never Smoker  . Smokeless tobacco: Never Used  Substance and Sexual Activity  . Alcohol use: No  . Drug use: No  . Sexual activity: Never    Birth control/protection: Post-menopausal, Surgical    Comment: HYST  Lifestyle  . Physical activity:    Days per week: Not on file    Minutes per session: Not on file  .  Stress: Not on file  Relationships  . Social connections:    Talks on phone: Not on file    Gets together: Not on file    Attends religious service: Not on file    Active member of club or organization: Not on file    Attends meetings of clubs or organizations: Not on file    Relationship status: Not on file  . Intimate partner violence:    Fear of current or ex partner: Not on file    Emotionally abused: Not on file    Physically abused: Not on file    Forced sexual activity: Not on file  Other Topics Concern  . Not on file  Social History Narrative   Married.   2 children, 5 grandchildren.   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   Enjoys puzzles, sewing, traveling, reading.     Past Surgical History:  Procedure Laterality Date  . ANTERIOR AND POSTERIOR REPAIR  6/27/Calderon   Procedure: ANTERIOR (CYSTOCELE) AND POSTERIOR REPAIR (RECTOCELE);  Surgeon: [name], MD;  Location: [facility];  Service: Gynecology;  Laterality: N/A;  with cysto  . Bladder tact  15 years ago  . LEFT HEART CATHETERIZATION WITH CORONARY ANGIOGRAM N/A 8/14/Calderon   Procedure: LEFT HEART CATHETERIZATION WITH CORONARY ANGIOGRAM;  Surgeon: [name], MD;  [facility];  Service: Cardiovascular;  Laterality: N/A;  . NECK SURGERY  2010  . SHOULDER SURGERY    . thyroid disease    . TONSILLECTOMY    . VAGINAL HYSTERECTOMY  15 years ago    Family History  Problem Relation Age of Onset  . Thyroid disease Mother   . Hypertension Mother   . Coronary artery disease Mother   . Dementia Mother   . Aneurysm Father   . Pulmonary fibrosis Sister   . Stroke Other   . Sleep apnea Other   . Cancer Brother        LIVER  . Pulmonary fibrosis Brother     Allergies  Allergen Reactions  . Dilaudid [Hydromorphone] Nausea And Vomiting    Current Outpatient Medications on File Prior to Visit  Medication Sig Dispense Refill  . albuterol (PROVENTIL HFA;VENTOLIN HFA) 108 (90 BASE) MCG/ACT inhaler Inhale 2 puffs into the lungs every 6 (six) hours as needed for wheezing or shortness of breath. 1 Inhaler 6  . aspirin 81 MG tablet Take 81 mg by mouth daily.    . calcium-vitamin D (OSCAL WITH D) 500-200 MG-UNIT per tablet Take 1 tablet by mouth every morning.    . Cranberry 1000 MG CAPS Take 1 capsule by mouth daily.    . ESBRIET 267 MG CAPS TAKE 3 CAPSULES BY MOUTH THREE TIMES DAILY WITH FOOD 270 capsule 3  . hydrochlorothiazide (HYDRODIURIL) 25 MG tablet TAKE ONE TABLET BY MOUTH EVERY DAY FOR BLOOD PRESSURE 90 tablet 3  . levothyroxine (SYNTHROID, LEVOTHROID) 88 MCG tablet TAKE 1 TABLET BY MOUTH EVERY DAY 90 tablet 0  .  loratadine (CLARITIN) 10 MG tablet Take 10 mg by mouth every evening. Reported on 10/13/2015    . Multiple Vitamin (MULTIVITAMIN WITH MINERALS) TABS Take 1 tablet by mouth every morning. Centrum silver    . omeprazole (PRILOSEC) 20 MG capsule TAKE 1 CAPSULE (20 MG TOTAL) BY MOUTH DAILY. 90 capsule 1   No current facility-administered medications on file prior to visit.     BP 110/60   Pulse 89   Temp 97.9 F (36.6 C) (Oral)   Ht  5' 2.75" (1.594 m)   Wt 129 lb 12 oz (58.9 kg)   SpO2 98%   BMI 23.17 kg/m    Objective:   Physical Exam  Constitutional: She is oriented to person, place, and time. She appears well-nourished.  HENT:  Mouth/Throat: No oropharyngeal exudate.  Eyes: Pupils are equal, round, and reactive to light. EOM are normal.  Neck: Neck supple. No thyromegaly present.  Cardiovascular: Normal rate and regular rhythm.  Respiratory: Effort normal and breath sounds normal.  Crackles to right base  GI: Soft. Bowel sounds are normal. There is no tenderness.  Musculoskeletal: Normal range of motion.  Neurological: She is alert and oriented to person, place, and time.  Skin: Skin is warm and dry.  Psychiatric: She has a normal mood and affect.           Assessment & Plan:

## 2017-12-18 NOTE — Assessment & Plan Note (Signed)
Recent A1C of 6.4, stable from prior years.  Discussed to work on exercise as tolerated, limit carbs/sweets. Will continue to monitor.

## 2017-12-18 NOTE — Assessment & Plan Note (Signed)
Gradually elevated over the years. ASCVD risk score of 21%. Given history of prediabetes, hypertension, and hyperlipidemia will treat.  Rx for low dose Crestor sent to pharmacy. Repeat lipids and LFT's in 6 weeks.

## 2017-12-18 NOTE — Assessment & Plan Note (Signed)
Immunizations UTD. Mammogram UTD.  Bone density due, she'd like to defer this to next year. Other health maintenance UTD. Exam unremarkable. Labs with hyperlipidemia and prediabetes, also hyponatremia. Will address. Follow up in 1 year for CPE.

## 2017-12-18 NOTE — Patient Instructions (Signed)
Start rosuvastatin 5 mg tablets for high cholesterol. Take 1 tablet by mouth every evening.  Start exercising. You should be getting 150 minutes of exercise weekly, start slowly as tolerated.  Increase consumption of fresh fruits, vegetables, whole grains, lean protein.  Ensure you are consuming 64 ounces of water daily.  Schedule a lab only appointment for Monday next week and then again in 6 weeks. Make sure you come fasting 4 hours prior to your lab appointment in 6 weeks.  Follow up in 1 year for your annual exam or sooner if needed.  It was a pleasure to see you today!   Preventive Care 78 Years and Older, Female Preventive care refers to lifestyle choices and visits with your health care provider that can promote health and wellness. What does preventive care include?  A yearly physical exam. This is also called an annual well check.  Dental exams once or twice a year.  Routine eye exams. Ask your health care provider how often you should have your eyes checked.  Personal lifestyle choices, including: ? Daily care of your teeth and gums. ? Regular physical activity. ? Eating a healthy diet. ? Avoiding tobacco and drug use. ? Limiting alcohol use. ? Practicing safe sex. ? Taking low-dose aspirin every day. ? Taking vitamin and mineral supplements as recommended by your health care provider. What happens during an annual well check? The services and screenings done by your health care provider during your annual well check will depend on your age, overall health, lifestyle risk factors, and family history of disease. Counseling Your health care provider may ask you questions about your:  Alcohol use.  Tobacco use.  Drug use.  Emotional well-being.  Home and relationship well-being.  Sexual activity.  Eating habits.  History of falls.  Memory and ability to understand (cognition).  Work and work Statistician.  Reproductive health.  Screening You may  have the following tests or measurements:  Height, weight, and BMI.  Blood pressure.  Lipid and cholesterol levels. These may be checked every 5 years, or more frequently if you are over 78 years old.  Skin check.  Lung cancer screening. You may have this screening every year starting at age 47 if you have a 30-pack-year history of smoking and currently smoke or have quit within the past 15 years.  Fecal occult blood test (FOBT) of the stool. You may have this test every year starting at age 47.  Flexible sigmoidoscopy or colonoscopy. You may have a sigmoidoscopy every 5 years or a colonoscopy every 10 years starting at age 65.  Hepatitis C blood test.  Hepatitis B blood test.  Sexually transmitted disease (STD) testing.  Diabetes screening. This is done by checking your blood sugar (glucose) after you have not eaten for a while (fasting). You may have this done every 1-3 years.  Bone density scan. This is done to screen for osteoporosis. You may have this done starting at age 83.  Mammogram. This may be done every 1-2 years. Talk to your health care provider about how often you should have regular mammograms.  Talk with your health care provider about your test results, treatment options, and if necessary, the need for more tests. Vaccines Your health care provider may recommend certain vaccines, such as:  Influenza vaccine. This is recommended every year.  Tetanus, diphtheria, and acellular pertussis (Tdap, Td) vaccine. You may need a Td booster every 10 years.  Varicella vaccine. You may need this if you have not  been vaccinated.  Zoster vaccine. You may need this after age 34.  Measles, mumps, and rubella (MMR) vaccine. You may need at least one dose of MMR if you were born in 1957 or later. You may also need a second dose.  Pneumococcal 13-valent conjugate (PCV13) vaccine. One dose is recommended after age 68.  Pneumococcal polysaccharide (PPSV23) vaccine. One dose is  recommended after age 72.  Meningococcal vaccine. You may need this if you have certain conditions.  Hepatitis A vaccine. You may need this if you have certain conditions or if you travel or work in places where you may be exposed to hepatitis A.  Hepatitis B vaccine. You may need this if you have certain conditions or if you travel or work in places where you may be exposed to hepatitis B.  Haemophilus influenzae type b (Hib) vaccine. You may need this if you have certain conditions.  Talk to your health care provider about which screenings and vaccines you need and how often you need them. This information is not intended to replace advice given to you by your health care provider. Make sure you discuss any questions you have with your health care provider. Document Released: 06/23/2015 Document Revised: 02/14/2016 Document Reviewed: 03/28/2015 Elsevier Interactive Patient Education  [employer].

## 2017-12-18 NOTE — Assessment & Plan Note (Signed)
Stable in the office today. Recent BMP with hyponatremia. Will repeat BMP next week as this is not usual for her. If BMP remains low then consider cutting back on HCTZ dose. Renal function stable.

## 2017-12-18 NOTE — Assessment & Plan Note (Signed)
Not using albuterol, removed from medication list.

## 2017-12-18 NOTE — Assessment & Plan Note (Signed)
Following with pulmonology, compliant to regimen. Will be starting therapy next week.

## 2017-12-18 NOTE — Assessment & Plan Note (Signed)
Compliant to omeprazole 20 mg daily. Continue same.

## 2017-12-18 NOTE — Assessment & Plan Note (Signed)
Recent TSH unremarkable. Continue levothyroxine 88 mcg.

## 2017-12-22 ENCOUNTER — Other Ambulatory Visit: Payer: Medicare Other

## 2017-12-22 ENCOUNTER — Other Ambulatory Visit: Payer: Self-pay

## 2017-12-22 ENCOUNTER — Encounter: Payer: Medicare Other | Attending: Emergency Medicine

## 2017-12-22 VITALS — Ht 62.1 in | Wt 127.7 lb

## 2017-12-22 DIAGNOSIS — Z7982 Long term (current) use of aspirin: Secondary | ICD-10-CM | POA: Insufficient documentation

## 2017-12-22 DIAGNOSIS — Z79899 Other long term (current) drug therapy: Secondary | ICD-10-CM | POA: Insufficient documentation

## 2017-12-22 DIAGNOSIS — J849 Interstitial pulmonary disease, unspecified: Secondary | ICD-10-CM

## 2017-12-22 DIAGNOSIS — K219 Gastro-esophageal reflux disease without esophagitis: Secondary | ICD-10-CM | POA: Diagnosis not present

## 2017-12-22 DIAGNOSIS — E039 Hypothyroidism, unspecified: Secondary | ICD-10-CM | POA: Diagnosis not present

## 2017-12-22 DIAGNOSIS — J4520 Mild intermittent asthma, uncomplicated: Secondary | ICD-10-CM | POA: Diagnosis not present

## 2017-12-22 DIAGNOSIS — Z79890 Hormone replacement therapy (postmenopausal): Secondary | ICD-10-CM | POA: Diagnosis not present

## 2017-12-22 DIAGNOSIS — I110 Hypertensive heart disease with heart failure: Secondary | ICD-10-CM | POA: Diagnosis not present

## 2017-12-22 DIAGNOSIS — I509 Heart failure, unspecified: Secondary | ICD-10-CM | POA: Diagnosis not present

## 2017-12-22 NOTE — Patient Instructions (Signed)
Patient Instructions  Patient Details  Name: Tina Calderon MRN: [id number] Date of Birth: 1941-02-28 Referring Provider:  Collene Gobble, MD  Below are your personal goals for exercise, nutrition, and risk factors. Our goal is to help you stay on track towards obtaining and maintaining these goals. We will be discussing your progress on these goals with you throughout the program.  Initial Exercise Prescription: Initial Exercise Prescription - 12/22/17 1600      Date of Initial Exercise RX and Referring Provider   Date  12/22/17    Referring Provider  Court Joy MD      Treadmill   MPH  2.5    Grade  0.5    Minutes  15    METs  3.09      NuStep   Level  2    SPM  80    Minutes  15    METs  2.5      REL-XR   Level  1    Speed  50    Minutes  15    METs  2.5      Prescription Details   Frequency (times per week)  3    Duration  Progress to 45 minutes of aerobic exercise without signs/symptoms of physical distress      Intensity   THRR 40-80% of Max Heartrate  114-133    Ratings of Perceived Exertion  11-13    Perceived Dyspnea  0-4      Progression   Progression  Continue to progress workloads to maintain intensity without signs/symptoms of physical distress.      Resistance Training   Training Prescription  Yes    Weight  3 lbs    Reps  10-15       Exercise Goals: Frequency: Be able to perform aerobic exercise two to three times per week in program working toward 2-5 days per week of home exercise.  Intensity: Work with a perceived exertion of 11 (fairly light) - 15 (hard) while following your exercise prescription.  We will make changes to your prescription with you as you progress through the program.   Duration: Be able to do 30 to 45 minutes of continuous aerobic exercise in addition to a 5 minute warm-up and a 5 minute cool-down routine.   Nutrition Goals: Your personal nutrition goals will be established when you do your nutrition analysis with  the dietician.  The following are general nutrition guidelines to follow: Cholesterol < 249m/day Sodium < 15061mday Fiber: Women over 50 yrs - 21 grams per day  Personal Goals: Personal Goals and Risk Factors at Admission - 12/22/17 1458      Core Components/Risk Factors/Patient Goals on Admission    Weight Management  Yes;Weight Maintenance    Intervention  Weight Management: Develop a combined nutrition and exercise program designed to reach desired caloric intake, while maintaining appropriate intake of nutrient and fiber, sodium and fats, and appropriate energy expenditure required for the weight goal.;Weight Management: Provide education and appropriate resources to help participant work on and attain dietary goals.;Weight Management/Obesity: Establish reasonable short term and long term weight goals.    Admit Weight  127 lb 11.2 oz (57.9 kg)    Goal Weight: Short Term  127 lb (57.6 kg)    Goal Weight: Long Term  127 lb (57.6 kg)    Expected Outcomes  Short Term: Continue to assess and modify interventions until short term weight is achieved;Long Term: Adherence to nutrition and physical activity/exercise  program aimed toward attainment of established weight goal;Weight Maintenance: Understanding of the daily nutrition guidelines, which includes 25-35% calories from fat, 7% or less cal from saturated fats, less than 25m cholesterol, less than 1.5gm of sodium, & 5 or more servings of fruits and vegetables daily;Understanding recommendations for meals to include 15-35% energy as protein, 25-35% energy from fat, 35-60% energy from carbohydrates, less than 2058mof dietary cholesterol, 20-35 gm of total fiber daily;Understanding of distribution of calorie intake throughout the day with the consumption of 4-5 meals/snacks    Improve shortness of breath with ADL's  Yes    Intervention  Provide education, individualized exercise plan and daily activity instruction to help decrease symptoms of SOB  with activities of daily living.    Expected Outcomes  Short Term: Improve cardiorespiratory fitness to achieve a reduction of symptoms when performing ADLs;Long Term: Be able to perform more ADLs without symptoms or delay the onset of symptoms    Hypertension  Yes    Intervention  Provide education on lifestyle modifcations including regular physical activity/exercise, weight management, moderate sodium restriction and increased consumption of fresh fruit, vegetables, and low fat dairy, alcohol moderation, and smoking cessation.;Monitor prescription use compliance.    Expected Outcomes  Short Term: Continued assessment and intervention until BP is < 140/9078mG in hypertensive participants. < 130/64m69m in hypertensive participants with diabetes, heart failure or chronic kidney disease.;Long Term: Maintenance of blood pressure at goal levels.       Tobacco Use Initial Evaluation: Social History   Tobacco Use  Smoking Status Never Smoker  Smokeless Tobacco Never Used    Exercise Goals and Review: Exercise Goals    Row Name 12/22/17 1530             Exercise Goals   Increase Physical Activity  Yes       Intervention  Provide advice, education, support and counseling about physical activity/exercise needs.;Develop an individualized exercise prescription for aerobic and resistive training based on initial evaluation findings, risk stratification, comorbidities and participant's personal goals.       Expected Outcomes  Short Term: Attend rehab on a regular basis to increase amount of physical activity.;Long Term: Add in home exercise to make exercise part of routine and to increase amount of physical activity.;Long Term: Exercising regularly at least 3-5 days a week.       Increase Strength and Stamina  Yes       Intervention  Provide advice, education, support and counseling about physical activity/exercise needs.;Develop an individualized exercise prescription for aerobic and resistive  training based on initial evaluation findings, risk stratification, comorbidities and participant's personal goals.       Expected Outcomes  Short Term: Perform resistance training exercises routinely during rehab and add in resistance training at home;Short Term: Increase workloads from initial exercise prescription for resistance, speed, and METs.;Long Term: Improve cardiorespiratory fitness, muscular endurance and strength as measured by increased METs and functional capacity (6MWT)       Able to understand and use rate of perceived exertion (RPE) scale  Yes       Intervention  Provide education and explanation on how to use RPE scale       Expected Outcomes  Short Term: Able to use RPE daily in rehab to express subjective intensity level;Long Term:  Able to use RPE to guide intensity level when exercising independently       Able to understand and use Dyspnea scale  Yes  Intervention  Provide education and explanation on how to use Dyspnea scale       Expected Outcomes  Short Term: Able to use Dyspnea scale daily in rehab to express subjective sense of shortness of breath during exertion;Long Term: Able to use Dyspnea scale to guide intensity level when exercising independently       Knowledge and understanding of Target Heart Rate Range (THRR)  Yes       Intervention  Provide education and explanation of THRR including how the numbers were predicted and where they are located for reference       Expected Outcomes  Short Term: Able to state/look up THRR;Short Term: Able to use daily as guideline for intensity in rehab;Long Term: Able to use THRR to govern intensity when exercising independently       Able to check pulse independently  Yes       Intervention  Provide education and demonstration on how to check pulse in carotid and radial arteries.;Review the importance of being able to check your own pulse for safety during independent exercise       Expected Outcomes  Short Term: Able to  explain why pulse checking is important during independent exercise;Long Term: Able to check pulse independently and accurately       Understanding of Exercise Prescription  Yes       Intervention  Provide education, explanation, and written materials on patient's individual exercise prescription       Expected Outcomes  Long Term: Able to explain home exercise prescription to exercise independently;Short Term: Able to explain program exercise prescription          Copy of goals given to participant.

## 2017-12-22 NOTE — Progress Notes (Signed)
Pulmonary Individual Treatment Plan  Patient Details  Name: Tina Calderon MRN: [id number] Date of Birth: 04-15-1941 Referring Provider:     Pulmonary Rehab from 12/22/2017 in [facility] Cardiac and Pulmonary Rehab  Referring Provider  Court Joy MD      Initial Encounter Date:    Pulmonary Rehab from 12/22/2017 in [facility] Cardiac and Pulmonary Rehab  Date  12/22/17      Visit Diagnosis: Interstitial lung disease ([place])  Patient's Home Medications on Admission:  Current Outpatient Medications:  .  aspirin 81 MG tablet, Take 81 mg by mouth daily., Disp: , Rfl:  .  calcium-vitamin D (OSCAL WITH D) 500-200 MG-UNIT per tablet, Take 1 tablet by mouth every morning., Disp: , Rfl:  .  Cranberry 1000 MG CAPS, Take 1 capsule by mouth daily., Disp: , Rfl:  .  ESBRIET 267 MG CAPS, TAKE 3 CAPSULES BY MOUTH THREE TIMES DAILY WITH FOOD, Disp: 270 capsule, Rfl: 3 .  hydrochlorothiazide (HYDRODIURIL) 25 MG tablet, TAKE ONE TABLET BY MOUTH EVERY DAY FOR BLOOD PRESSURE, Disp: 90 tablet, Rfl: 3 .  levothyroxine (SYNTHROID, LEVOTHROID) 88 MCG tablet, TAKE 1 TABLET BY MOUTH EVERY DAY, Disp: 90 tablet, Rfl: 0 .  loratadine (CLARITIN) 10 MG tablet, Take 10 mg by mouth every evening. Reported on 10/13/2015, Disp: , Rfl:  .  Multiple Vitamin (MULTIVITAMIN WITH MINERALS) TABS, Take 1 tablet by mouth every morning. Centrum silver, Disp: , Rfl:  .  omeprazole (PRILOSEC) 20 MG capsule, TAKE 1 CAPSULE (20 MG TOTAL) BY MOUTH DAILY., Disp: 90 capsule, Rfl: 1 .  rosuvastatin (CRESTOR) 5 MG tablet, Take 1 tablet by mouth every evening for cholesterol., Disp: 90 tablet, Rfl: 3  Past Medical History: Past Medical History:  Diagnosis Date  . Anginal pain ([place])   . Cancer ([place])   . CHF (congestive heart failure) ([place]) 01/22/2012  . Chronic fatigue 07/15/2014  . GERD (gastroesophageal reflux disease) 05/12/2017  . Hypertension   . Hypothyroidism   . ILD (interstitial lung disease) ([place]) 02/04/2012  . IPF (idiopathic pulmonary  fibrosis) ([place]) 2013  . Mild intermittent asthma 01/29/2016  . Pelvic relaxation   . Prolapse of bladder   . Shortness of breath   . Thyroid disease     Tobacco Use: Social History   Tobacco Use  Smoking Status Never Smoker  Smokeless Tobacco Never Used    Labs: Recent Review Flowsheet Data    Labs for ITP Cardiac and Pulmonary Rehab Latest Ref Rng & Units 01/21/2012 12/06/2016 12/16/2016 06/18/2017 12/15/2017   Cholestrol 0 - 200 mg/dL 192 237(H) - 229(H) 239(H)   LDLCALC 0 - 99 mg/dL 98 154(H) - 138(H) 149(H)   HDL >39.00 mg/dL 46 51.90 - 51.70 55.10   Trlycerides 0.0 - 149.0 mg/dL 239(H) 152.0(H) - 193.0(H) 175.0(H)   Hemoglobin A1c 4.6 - 6.5 % - - 6.3 6.4 6.4       Pulmonary Assessment Scores: Pulmonary Assessment Scores    Row Name 12/22/17 1430         ADL UCSD   ADL Phase  Entry     SOB Score total  61     Rest  0     Walk  3     Stairs  4     Bath  3     Dress  2     Shop  3       CAT Score   CAT Score  14       mMRC Score  mMRC Score  3        Pulmonary Function Assessment: Pulmonary Function Assessment - 12/22/17 1451      Breath   Bilateral Breath Sounds  Clear    Shortness of Breath  Limiting activity;Yes       Exercise Target Goals: Date: 12/22/17  Exercise Program Goal: Individual exercise prescription set using results from initial 6 min walk test and THRR while considering  patient's activity barriers and safety.    Exercise Prescription Goal: Initial exercise prescription builds to 30-45 minutes a day of aerobic activity, 2-3 days per week.  Home exercise guidelines will be given to patient during program as part of exercise prescription that the participant will acknowledge.  Activity Barriers & Risk Stratification: Activity Barriers & Cardiac Risk Stratification - 12/22/17 1529      Activity Barriers & Cardiac Risk Stratification   Activity Barriers  Deconditioning;Muscular Weakness;Shortness of Breath       6 Minute Walk: 6  Minute Walk    Row Name 12/22/17 1527         6 Minute Walk   Phase  Initial     Distance  1467 feet     Walk Time  6 minutes     # of Rest Breaks  0     MPH  2.78     METS  3.48     RPE  13     Perceived Dyspnea   2     VO2 Peak  12.17     Symptoms  Yes (comment)     Comments  SOB, leg fatigue in thighs (burning)     Resting HR  95 bpm     Resting BP  126/64     Resting Oxygen Saturation   96 %     Exercise Oxygen Saturation  during 6 min walk  87 %     Max Ex. HR  148 bpm     Max Ex. BP  144/66     2 Minute Post BP  124/68       Interval HR   1 Minute HR  126     2 Minute HR  139     3 Minute HR  145     4 Minute HR  148     5 Minute HR  144     6 Minute HR  143     2 Minute Post HR  109     Interval Heart Rate?  Yes       Interval Oxygen   Interval Oxygen?  Yes     Baseline Oxygen Saturation %  96 %     1 Minute Oxygen Saturation %  91 %     1 Minute Liters of Oxygen  0 L Room Air     2 Minute Oxygen Saturation %  89 %     2 Minute Liters of Oxygen  0 L     3 Minute Oxygen Saturation %  88 %     3 Minute Liters of Oxygen  0 L     4 Minute Oxygen Saturation %  87 %     4 Minute Liters of Oxygen  0 L     5 Minute Oxygen Saturation %  87 %     5 Minute Liters of Oxygen  0 L     6 Minute Oxygen Saturation %  87 %     6 Minute Liters of Oxygen  0 L  2 Minute Post Oxygen Saturation %  95 %     2 Minute Post Liters of Oxygen  0 L       Oxygen Initial Assessment: Oxygen Initial Assessment - 12/22/17 1455      Home Oxygen   Home Oxygen Device  None    Sleep Oxygen Prescription  None    Home Exercise Oxygen Prescription  None    Home at Rest Exercise Oxygen Prescription  None      Initial 6 min Walk   Oxygen Used  None      Program Oxygen Prescription   Program Oxygen Prescription  None      Intervention   Short Term Goals  To learn and understand importance of monitoring SPO2 with pulse oximeter and demonstrate accurate use of the pulse oximeter.;To  learn and understand importance of maintaining oxygen saturations>88%;To learn and demonstrate proper pursed lip breathing techniques or other breathing techniques.;To learn and demonstrate proper use of respiratory medications    Long  Term Goals  Verbalizes importance of monitoring SPO2 with pulse oximeter and return demonstration;Maintenance of O2 saturations>88%;Exhibits proper breathing techniques, such as pursed lip breathing or other method taught during program session;Compliance with respiratory medication;Demonstrates proper use of MDI's       Oxygen Re-Evaluation:   Oxygen Discharge (Final Oxygen Re-Evaluation):   Initial Exercise Prescription: Initial Exercise Prescription - 12/22/17 1600      Date of Initial Exercise RX and Referring Provider   Date  12/22/17    Referring Provider  Court Joy MD      Treadmill   MPH  2.5    Grade  0.5    Minutes  15    METs  3.09      NuStep   Level  2    SPM  80    Minutes  15    METs  2.5      REL-XR   Level  1    Speed  50    Minutes  15    METs  2.5      Prescription Details   Frequency (times per week)  3    Duration  Progress to 45 minutes of aerobic exercise without signs/symptoms of physical distress      Intensity   THRR 40-80% of Max Heartrate  114-133    Ratings of Perceived Exertion  11-13    Perceived Dyspnea  0-4      Progression   Progression  Continue to progress workloads to maintain intensity without signs/symptoms of physical distress.      Resistance Training   Training Prescription  Yes    Weight  3 lbs    Reps  10-15       Perform Capillary Blood Glucose checks as needed.  Exercise Prescription Changes: Exercise Prescription Changes    Row Name 12/22/17 1500             Response to Exercise   Blood Pressure (Admit)  126/64       Blood Pressure (Exercise)  144/66       Blood Pressure (Exit)  124/68       Heart Rate (Admit)  95 bpm       Heart Rate (Exercise)  148 bpm        Heart Rate (Exit)  109 bpm       Oxygen Saturation (Admit)  96 %       Oxygen Saturation (Exercise)  87 %  Oxygen Saturation (Exit)  95 %       Rating of Perceived Exertion (Exercise)  13       Perceived Dyspnea (Exercise)  2       Symptoms  SOB, leg fatigue in thighs (burning)       Comments  walk test results          Exercise Comments:   Exercise Goals and Review: Exercise Goals    Row Name 12/22/17 1530             Exercise Goals   Increase Physical Activity  Yes       Intervention  Provide advice, education, support and counseling about physical activity/exercise needs.;Develop an individualized exercise prescription for aerobic and resistive training based on initial evaluation findings, risk stratification, comorbidities and participant's personal goals.       Expected Outcomes  Short Term: Attend rehab on a regular basis to increase amount of physical activity.;Long Term: Add in home exercise to make exercise part of routine and to increase amount of physical activity.;Long Term: Exercising regularly at least 3-5 days a week.       Increase Strength and Stamina  Yes       Intervention  Provide advice, education, support and counseling about physical activity/exercise needs.;Develop an individualized exercise prescription for aerobic and resistive training based on initial evaluation findings, risk stratification, comorbidities and participant's personal goals.       Expected Outcomes  Short Term: Perform resistance training exercises routinely during rehab and add in resistance training at home;Short Term: Increase workloads from initial exercise prescription for resistance, speed, and METs.;Long Term: Improve cardiorespiratory fitness, muscular endurance and strength as measured by increased METs and functional capacity (6MWT)       Able to understand and use rate of perceived exertion (RPE) scale  Yes       Intervention  Provide education and explanation on how to use RPE  scale       Expected Outcomes  Short Term: Able to use RPE daily in rehab to express subjective intensity level;Long Term:  Able to use RPE to guide intensity level when exercising independently       Able to understand and use Dyspnea scale  Yes       Intervention  Provide education and explanation on how to use Dyspnea scale       Expected Outcomes  Short Term: Able to use Dyspnea scale daily in rehab to express subjective sense of shortness of breath during exertion;Long Term: Able to use Dyspnea scale to guide intensity level when exercising independently       Knowledge and understanding of Target Heart Rate Range (THRR)  Yes       Intervention  Provide education and explanation of THRR including how the numbers were predicted and where they are located for reference       Expected Outcomes  Short Term: Able to state/look up THRR;Short Term: Able to use daily as guideline for intensity in rehab;Long Term: Able to use THRR to govern intensity when exercising independently       Able to check pulse independently  Yes       Intervention  Provide education and demonstration on how to check pulse in carotid and radial arteries.;Review the importance of being able to check your own pulse for safety during independent exercise       Expected Outcomes  Short Term: Able to explain why pulse checking is important during independent exercise;Long Term:  Able to check pulse independently and accurately       Understanding of Exercise Prescription  Yes       Intervention  Provide education, explanation, and written materials on patient's individual exercise prescription       Expected Outcomes  Long Term: Able to explain home exercise prescription to exercise independently;Short Term: Able to explain program exercise prescription          Exercise Goals Re-Evaluation :   Discharge Exercise Prescription (Final Exercise Prescription Changes): Exercise Prescription Changes - 12/22/17 1500      Response to  Exercise   Blood Pressure (Admit)  126/64    Blood Pressure (Exercise)  144/66    Blood Pressure (Exit)  124/68    Heart Rate (Admit)  95 bpm    Heart Rate (Exercise)  148 bpm    Heart Rate (Exit)  109 bpm    Oxygen Saturation (Admit)  96 %    Oxygen Saturation (Exercise)  87 %    Oxygen Saturation (Exit)  95 %    Rating of Perceived Exertion (Exercise)  13    Perceived Dyspnea (Exercise)  2    Symptoms  SOB, leg fatigue in thighs (burning)    Comments  walk test results       Nutrition:  Target Goals: Understanding of nutrition guidelines, daily intake of sodium <1521m, cholesterol <2063m calories 30% from fat and 7% or less from saturated fats, daily to have 5 or more servings of fruits and vegetables.  Biometrics: Pre Biometrics - 12/22/17 1531      Pre Biometrics   Height  5' 2.1" (1.577 m)    Weight  127 lb 11.2 oz (57.9 kg)    Waist Circumference  30 inches    Hip Circumference  35 inches    Waist to Hip Ratio  0.86 %    BMI (Calculated)  23.29    Single Leg Stand  30 seconds        Nutrition Therapy Plan and Nutrition Goals: Nutrition Therapy & Goals - 12/22/17 1449      Personal Nutrition Goals   Nutrition Goal  Lose some weight.    Personal Goal #2  Meet with the dietician.    Comments  Sometimes she gets tired and does not cook and will make a frozen. She tends to eat healthy when she can cook.      Intervention Plan   Intervention  Nutrition handout(s) given to patient.;Prescribe, educate and counsel regarding individualized specific dietary modifications aiming towards targeted core components such as weight, hypertension, lipid management, diabetes, heart failure and other comorbidities.    Expected Outcomes  Short Term Goal: Understand basic principles of dietary content, such as calories, fat, sodium, cholesterol and nutrients.;Long Term Goal: Adherence to prescribed nutrition plan.       Nutrition Assessments: Nutrition Assessments - 12/22/17 1434       MEDFICTS Scores   Pre Score  21       Nutrition Goals Re-Evaluation:   Nutrition Goals Discharge (Final Nutrition Goals Re-Evaluation):   Psychosocial: Target Goals: Acknowledge presence or absence of significant depression and/or stress, maximize coping skills, provide positive support system. Participant is able to verbalize types and ability to use techniques and skills needed for reducing stress and depression.   Initial Review & Psychosocial Screening: Initial Psych Review & Screening - 12/22/17 1446      Initial Review   Current issues with  Current Sleep Concerns;Current Stress Concerns  Source of Stress Concerns  Chronic Illness;Unable to perform yard/household activities    Comments  Her house work is hard to due with her shortness of breath. Her ILD is making it stressful on her. 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      Family Dynamics   Good Support System?  Yes    Comments  She can look to her sister and daughter. She does a therapy group for pulmonary fibrosis.       Barriers   Psychosocial barriers to participate in program  The patient should benefit from training in stress management and relaxation.      Screening Interventions   Interventions  To provide support and resources with identified psychosocial needs;Encouraged to exercise;Program counselor consult;Provide feedback about the scores to participant    Expected Outcomes  Short Term goal: Utilizing psychosocial counselor, staff and physician to assist with identification of specific Stressors or current issues interfering with healing process. Setting desired goal for each stressor or current issue identified.;Long Term Goal: Stressors or current issues are controlled or eliminated.;Short Term goal: Identification and review with participant of any Quality of Life or Depression concerns found by scoring the questionnaire.;Long Term goal: The participant improves quality of Life and PHQ9 Scores  as seen by post scores and/or verbalization of changes       Quality of Life Scores:  Scores of 19 and below usually indicate a poorer quality of life in these areas.  A difference of  2-3 points is a clinically meaningful difference.  A difference of 2-3 points in the total score of the Quality of Life Index has been associated with significant improvement in overall quality of life, self-image, physical symptoms, and general health in studies assessing change in quality of life.  PHQ-9: Recent Review Flowsheet Data    Depression screen [facility] 2/9 12/22/2017 12/15/2017 12/06/2016   Decreased Interest 1 0 0   Down, Depressed, Hopeless 1 0 0   PHQ - 2 Score 2 0 0   Altered sleeping 2 0 -   Tired, decreased energy 2 0 -   Change in appetite 2 0 -   Feeling bad or failure about yourself  1 0 -   Trouble concentrating 1 0 -   Moving slowly or fidgety/restless 0 0 -   Suicidal thoughts 0 0 -   PHQ-9 Score 10 0 -   Difficult doing work/chores Somewhat difficult Not difficult at all -     Interpretation of Total Score  Total Score Depression Severity:  1-4 = Minimal depression, 5-9 = Mild depression, 10-14 = Moderate depression, 15-19 = Moderately severe depression, 20-27 = Severe depression   Psychosocial Evaluation and Intervention:   Psychosocial Re-Evaluation:   Psychosocial Discharge (Final Psychosocial Re-Evaluation):   Education: Education Goals: Education classes will be provided on a weekly basis, covering required topics. Participant will state understanding/return demonstration of topics presented.  Learning Barriers/Preferences: Learning Barriers/Preferences - 12/22/17 1452      Learning Barriers/Preferences   Learning Barriers  Sight wears glasses    Learning Preferences  None       Education Topics:  Initial Evaluation Education: - Verbal, written and demonstration of respiratory meds, oximetry and breathing techniques. Instruction on use of nebulizers and MDIs  and importance of monitoring MDI activations.   Pulmonary Rehab from 12/22/2017 in [facility] Cardiac and Pulmonary Rehab  Date  12/22/17  Educator  [facility]  Instruction Review Code  1- Verbalizes Understanding  General Nutrition Guidelines/Fats and Fiber: -Group instruction provided by verbal, written material, models and posters to present the general guidelines for heart healthy nutrition. Gives an explanation and review of dietary fats and fiber.   Controlling Sodium/Reading Food Labels: -Group verbal and written material supporting the discussion of sodium use in heart healthy nutrition. Review and explanation with models, verbal and written materials for utilization of the food label.   Exercise Physiology & General Exercise Guidelines: - Group verbal and written instruction with models to review the exercise physiology of the cardiovascular system and associated critical values. Provides general exercise guidelines with specific guidelines to those with heart or lung disease.    Aerobic Exercise & Resistance Training: - Gives group verbal and written instruction on the various components of exercise. Focuses on aerobic and resistive training programs and the benefits of this training and how to safely progress through these programs.   Flexibility, Balance, Mind/Body Relaxation: Provides group verbal/written instruction on the benefits of flexibility and balance training, including mind/body exercise modes such as yoga, pilates and tai chi.  Demonstration and skill practice provided.   Stress and Anxiety: - Provides group verbal and written instruction about the health risks of elevated stress and causes of high stress.  Discuss the correlation between heart/lung disease and anxiety and treatment options. Review healthy ways to manage with stress and anxiety.   Depression: - Provides group verbal and written instruction on the correlation between heart/lung disease and depressed mood,  treatment options, and the stigmas associated with seeking treatment.   Exercise & Equipment Safety: - Individual verbal instruction and demonstration of equipment use and safety with use of the equipment.   Pulmonary Rehab from 12/22/2017 in [facility] Cardiac and Pulmonary Rehab  Date  12/22/17  Educator  [facility]  Instruction Review Code  1- Verbalizes Understanding      Infection Prevention: - Provides verbal and written material to individual with discussion of infection control including proper hand washing and proper equipment cleaning during exercise session.   Pulmonary Rehab from 12/22/2017 in [facility] Cardiac and Pulmonary Rehab  Date  12/22/17  Educator  [facility]  Instruction Review Code  1- Verbalizes Understanding      Falls Prevention: - Provides verbal and written material to individual with discussion of falls prevention and safety.   Pulmonary Rehab from 12/22/2017 in [facility] Cardiac and Pulmonary Rehab  Date  12/22/17  Educator  [facility]  Instruction Review Code  1- Verbalizes Understanding      Diabetes: - Individual verbal and written instruction to review signs/symptoms of diabetes, desired ranges of glucose level fasting, after meals and with exercise. Advice that pre and post exercise glucose checks will be done for 3 sessions at entry of program.   Chronic Lung Diseases: - Group verbal and written instruction to review updates, respiratory medications, advancements in procedures and treatments. Discuss use of supplemental oxygen including available portable oxygen systems, continuous and intermittent flow rates, concentrators, personal use and safety guidelines. Review proper use of inhaler and spacers. Provide informative websites for self-education.    Energy Conservation: - Provide group verbal and written instruction for methods to conserve energy, plan and organize activities. Instruct on pacing techniques, use of adaptive equipment and posture/positioning to relieve shortness of  breath.   Triggers and Exacerbations: - Group verbal and written instruction to review types of environmental triggers and ways to prevent exacerbations. Discuss weather changes, air quality and the benefits of nasal washing. Review warning signs and symptoms to help  prevent infections. Discuss techniques for effective airway clearance, coughing, and vibrations.   AED/CPR: - Group verbal and written instruction with the use of models to demonstrate the basic use of the AED with the basic ABC's of resuscitation.   Anatomy and Physiology of the Lungs: - Group verbal and written instruction with the use of models to provide basic lung anatomy and physiology related to function, structure and complications of lung disease.   Anatomy & Physiology of the Heart: - Group verbal and written instruction and models provide basic cardiac anatomy and physiology, with the coronary electrical and arterial systems. Review of Valvular disease and Heart Failure   Cardiac Medications: - Group verbal and written instruction to review commonly prescribed medications for heart disease. Reviews the medication, class of the drug, and side effects.   Know Your Numbers and Risk Factors: -Group verbal and written instruction about important numbers in your health.  Discussion of what are risk factors and how they play a role in the disease process.  Review of Cholesterol, Blood Pressure, Diabetes, and BMI and the role they play in your overall health.   Sleep Hygiene: -Provides group verbal and written instruction about how sleep can affect your health.  Define sleep hygiene, discuss sleep cycles and impact of sleep habits. Review good sleep hygiene tips.    Other: -Provides group and verbal instruction on various topics (see comments)    Knowledge Questionnaire Score: Knowledge Questionnaire Score - 12/22/17 1435      Knowledge Questionnaire Score   Pre Score  17/18 reviewed with patient         Core Components/Risk Factors/Patient Goals at Admission: Personal Goals and Risk Factors at Admission - 12/22/17 1458      Core Components/Risk Factors/Patient Goals on Admission    Weight Management  Yes;Weight Maintenance    Intervention  Weight Management: Develop a combined nutrition and exercise program designed to reach desired caloric intake, while maintaining appropriate intake of nutrient and fiber, sodium and fats, and appropriate energy expenditure required for the weight goal.;Weight Management: Provide education and appropriate resources to help participant work on and attain dietary goals.;Weight Management/Obesity: Establish reasonable short term and long term weight goals.    Admit Weight  127 lb 11.2 oz (57.9 kg)    Goal Weight: Short Term  127 lb (57.6 kg)    Goal Weight: Long Term  127 lb (57.6 kg)    Expected Outcomes  Short Term: Continue to assess and modify interventions until short term weight is achieved;Long Term: Adherence to nutrition and physical activity/exercise program aimed toward attainment of established weight goal;Weight Maintenance: Understanding of the daily nutrition guidelines, which includes 25-35% calories from fat, 7% or less cal from saturated fats, less than 234m cholesterol, less than 1.5gm of sodium, & 5 or more servings of fruits and vegetables daily;Understanding recommendations for meals to include 15-35% energy as protein, 25-35% energy from fat, 35-60% energy from carbohydrates, less than 2063mof dietary cholesterol, 20-35 gm of total fiber daily;Understanding of distribution of calorie intake throughout the day with the consumption of 4-5 meals/snacks    Improve shortness of breath with ADL's  Yes    Intervention  Provide education, individualized exercise plan and daily activity instruction to help decrease symptoms of SOB with activities of daily living.    Expected Outcomes  Short Term: Improve cardiorespiratory fitness to achieve a reduction  of symptoms when performing ADLs;Long Term: Be able to perform more ADLs without symptoms or delay the onset  of symptoms    Hypertension  Yes    Intervention  Provide education on lifestyle modifcations including regular physical activity/exercise, weight management, moderate sodium restriction and increased consumption of fresh fruit, vegetables, and low fat dairy, alcohol moderation, and smoking cessation.;Monitor prescription use compliance.    Expected Outcomes  Short Term: Continued assessment and intervention until BP is < 140/20m HG in hypertensive participants. < 130/87mHG in hypertensive participants with diabetes, heart failure or chronic kidney disease.;Long Term: Maintenance of blood pressure at goal levels.       Core Components/Risk Factors/Patient Goals Review:    Core Components/Risk Factors/Patient Goals at Discharge (Final Review):    ITP Comments: ITP Comments    Row Name 12/22/17 1416           ITP Comments  Medical Evaluation completed. Chart sent for review and changes to Dr. MaEmily Filbertirector of [facility]          Comments: Initial ITP

## 2017-12-22 NOTE — Progress Notes (Signed)
Daily Session Note  Patient Details  Name: BAILYNN DYK MRN: [id number] Date of Birth: [birth year] Referring Provider:    Encounter Date: 12/22/2017  Check In: Session Check In - 12/22/17 1412      Check-In   Location  [facility] Re[facility]    Staff Present  Justin Mend Lorre Nick, [place], RCEP, CCRP, Exercise Physiologist    Supervising physician immediately available to respond to emergencies  [facility] immediately available ER MD    Physician(s)  Dr. Corky Downs and Jimmye Norman    Medication changes reported      No    Fall or balance concerns reported     No    Warm-up and Cool-down  Not performed (comment) medical evaluation    Resistance Training Performed  No    VAD Patient?  No      Pain Assessment   Currently in Pain?  No/denies          Social History   Tobacco Use  Smoking Status Never Smoker  Smokeless Tobacco Never Used    Goals Met:  Exercise tolerated well No report of cardiac concerns or symptoms Strength training completed today  Goals Unmet:  Not Applicable  Comments:  6 Minute Walk    Row Name 12/22/17 1527         6 Minute Walk   Phase  Initial     Distance  1467 feet     Walk Time  6 minutes     # of Rest Breaks  0     MPH  2.78     METS  3.48     RPE  13     Perceived Dyspnea   2     VO2 Peak  12.17     Symptoms  Yes (comment)     Comments  SOB, leg fatigue in thighs (burning)     Resting HR  95 bpm     Resting BP  126/64     Resting Oxygen Saturation   96 %     Exercise Oxygen Saturation  during 6 min walk  87 %     Max Ex. HR  148 bpm     Max Ex. BP  144/66     2 Minute Post BP  124/68       Interval HR   1 Minute HR  126     2 Minute HR  139     3 Minute HR  145     4 Minute HR  148     5 Minute HR  144     6 Minute HR  143     2 Minute Post HR  109     Interval Heart Rate?  Yes       Interval Oxygen   Interval Oxygen?  Yes     Baseline Oxygen Saturation %  96 %     1 Minute Oxygen Saturation %   91 %     1 Minute Liters of Oxygen  0 L Room Air     2 Minute Oxygen Saturation %  89 %     2 Minute Liters of Oxygen  0 L     3 Minute Oxygen Saturation %  88 %     3 Minute Liters of Oxygen  0 L     4 Minute Oxygen Saturation %  87 %     4 Minute Liters of Oxygen  0 L     5  Minute Oxygen Saturation %  87 %     5 Minute Liters of Oxygen  0 L     6 Minute Oxygen Saturation %  87 %     6 Minute Liters of Oxygen  0 L     2 Minute Post Oxygen Saturation %  95 %     2 Minute Post Liters of Oxygen  0 L      Medical Evaluation. . Service time from 1410 to 1515   Dr. Emily Filbert is Medical Director for [place] and [facility].

## 2017-12-23 ENCOUNTER — Other Ambulatory Visit (INDEPENDENT_AMBULATORY_CARE_PROVIDER_SITE_OTHER): Payer: Medicare Other

## 2017-12-23 DIAGNOSIS — E871 Hypo-osmolality and hyponatremia: Secondary | ICD-10-CM | POA: Diagnosis not present

## 2017-12-23 LAB — BASIC METABOLIC PANEL
BUN: 20 mg/dL (ref 6–23)
CO2: 32 meq/L (ref 19–32)
CREATININE: 1.1 mg/dL (ref 0.40–1.20)
Calcium: 9.5 mg/dL (ref 8.4–10.5)
Chloride: 96 mEq/L (ref 96–112)
GFR: 51.05 mL/min — ABNORMAL LOW (ref >60.00)
GLUCOSE: 105 mg/dL — AB (ref 70–99)
Potassium: 4.1 mEq/L (ref 3.5–5.1)
Sodium: 135 mEq/L (ref 135–145)

## 2017-12-24 ENCOUNTER — Encounter: Payer: Self-pay | Admitting: Primary Care

## 2017-12-29 DIAGNOSIS — J849 Interstitial pulmonary disease, unspecified: Secondary | ICD-10-CM | POA: Diagnosis not present

## 2017-12-29 NOTE — Progress Notes (Signed)
Daily Session Note  Patient Details  Name: Tina Calderon MRN: [id number] Date of Birth: 04/25/1941 Referring Provider:     Pulmonary Rehab from 12/22/2017 in [facility] Cardiac and Pulmonary Rehab  Referring Provider  Court Joy MD      Encounter Date: 12/29/2017  Check In: Session Check In - 12/29/17 1043      Check-In   Location  [facility]    Staff Present  Justin Mend Jaci Carrel, BS, ACSM CEP, Exercise Physiologist;Amanda Oletta Darter, [place], ACSM CEP, Exercise Physiologist    Supervising physician immediately available to respond to emergencies  [facility] immediately available ER MD    Physician(s)  Dr. Cinda Quest and Dr. Archie Balboa    Medication changes reported      No    Fall or balance concerns reported     No    Tobacco Cessation  No Change    Warm-up and Cool-down  Performed as group-led instruction    Resistance Training Performed  No    VAD Patient?  No    PAD/SET Patient?  No      Pain Assessment   Currently in Pain?  No/denies    Multiple Pain Sites  No          Social History   Tobacco Use  Smoking Status Never Smoker  Smokeless Tobacco Never Used    Goals Met:  Proper associated with RPD/PD & O2 Sat Exercise tolerated well Personal goals reviewed No report of cardiac concerns or symptoms Strength training completed today  Goals Unmet:  Not Applicable  Comments: First full day of exercise!  Patient was oriented to gym and equipment including functions, settings, policies, and procedures.  Patient's individual exercise prescription and treatment plan were reviewed.  All starting workloads were established based on the results of the 6 minute walk test done at initial orientation visit.  The plan for exercise progression was also introduced and progression will be customized based on patient's performance and goals.    Dr. Emily Filbert is Medical Director for [place] and [facility].

## 2017-12-31 DIAGNOSIS — J849 Interstitial pulmonary disease, unspecified: Secondary | ICD-10-CM

## 2017-12-31 NOTE — Progress Notes (Signed)
Daily Session Note  Patient Details  Name: Tina Calderon MRN: [id number] Date of Birth: August 06, 1940 Referring Provider:     Pulmonary Rehab from 12/22/2017 in [facility] Cardiac and Pulmonary Rehab  Referring Provider  Court Joy MD      Encounter Date: 12/31/2017  Check In: Session Check In - 12/31/17 1025      Check-In   Location  [facility]    Staff Present  Justin Mend Lorre Nick, MA, RCEP, CCRP, Exercise Physiologist;Amanda Oletta Darter, [place], ACSM CEP, Exercise Physiologist    Supervising physician immediately available to respond to emergencies  [facility] immediately available ER MD    Physician(s)  Burlene Arnt and Paduchowski    Medication changes reported      No    Fall or balance concerns reported     No    Warm-up and Cool-down  Performed as group-led instruction    Resistance Training Performed  Yes    VAD Patient?  No    PAD/SET Patient?  No      Pain Assessment   Currently in Pain?  No/denies    Multiple Pain Sites  No          Social History   Tobacco Use  Smoking Status Never Smoker  Smokeless Tobacco Never Used    Goals Met:  Proper associated with RPD/PD & O2 Sat Independence with exercise equipment Exercise tolerated well Strength training completed today  Goals Unmet:  Not Applicable  Comments: Pt able to follow exercise prescription today without complaint.  Will continue to monitor for progression.    Dr. Emily Filbert is Medical Director for [place] and [facility].

## 2018-01-02 ENCOUNTER — Encounter: Payer: Medicare Other | Admitting: *Deleted

## 2018-01-02 DIAGNOSIS — J849 Interstitial pulmonary disease, unspecified: Secondary | ICD-10-CM

## 2018-01-02 NOTE — Progress Notes (Signed)
Daily Session Note  Patient Details  Name: Tina Calderon MRN: [id number] Date of Birth: [birth year] Referring Provider:     Pulmonary Rehab from 12/22/2017 in [facility] Cardiac and Pulmonary Rehab  Referring Provider  Court Joy MD      Encounter Date: 01/02/2018  Check In: Session Check In - 01/02/18 1005      Check-In   Supervising physician immediately available to respond to emergencies  [facility] immediately available ER MD    Physician(s)  Dr. Corky Downs and [facility]    Location  [facility] Re[facility]    Staff Present  Justin Mend RCP,RRT,BSRT;Saramarie Stinger Sherryll Burger, RN Vickki Hearing, BA, ACSM CEP, Exercise Physiologist    Medication changes reported      No    Fall or balance concerns reported     No    Tobacco Cessation  No Change    Warm-up and Cool-down  Performed as group-led instruction    Resistance Training Performed  Yes    VAD Patient?  No      Pain Assessment   Currently in Pain?  No/denies          Social History   Tobacco Use  Smoking Status Never Smoker  Smokeless Tobacco Never Used    Goals Met:  Proper associated with RPD/PD & O2 Sat Independence with exercise equipment Using PLB without cueing & demonstrates good technique Exercise tolerated well No report of cardiac concerns or symptoms Strength training completed today  Goals Unmet:  Not Applicable  Comments: Pt able to follow exercise prescription today without complaint.  Will continue to monitor for progression.    Dr. Emily Filbert is Medical Director for [place] and [facility].

## 2018-01-03 ENCOUNTER — Other Ambulatory Visit: Payer: Self-pay | Admitting: Primary Care

## 2018-01-05 ENCOUNTER — Encounter: Payer: Medicare Other | Admitting: *Deleted

## 2018-01-05 DIAGNOSIS — J849 Interstitial pulmonary disease, unspecified: Secondary | ICD-10-CM

## 2018-01-05 NOTE — Progress Notes (Signed)
Daily Session Note  Patient Details  Name: Tina Calderon MRN: [id number] Date of Birth: February 24, 1941 Referring Provider:     Pulmonary Rehab from 12/22/2017 in [facility] Cardiac and Pulmonary Rehab  Referring Provider  Court Joy MD      Encounter Date: 01/05/2018  Check In: Session Check In - 01/05/18 1024      Check-In   Supervising physician immediately available to respond to emergencies  [facility] immediately available ER MD    Physician(s)  Dr. Cinda Quest and Dr. Archie Balboa    Location  [facility]    Staff Present  Justin Mend Jaci Carrel, BS, ACSM CEP, Exercise Physiologist;Amanda Oletta Darter, [place], ACSM CEP, Exercise Physiologist    Medication changes reported      No    Fall or balance concerns reported     No    Tobacco Cessation  No Change    Warm-up and Cool-down  Performed as group-led instruction    Resistance Training Performed  Yes    VAD Patient?  No    PAD/SET Patient?  No      Pain Assessment   Currently in Pain?  No/denies    Multiple Pain Sites  No          Social History   Tobacco Use  Smoking Status Never Smoker  Smokeless Tobacco Never Used    Goals Met:  Proper associated with RPD/PD & O2 Sat Independence with exercise equipment Exercise tolerated well No report of cardiac concerns or symptoms Strength training completed today  Goals Unmet:  Not Applicable  Comments: Pt able to follow exercise prescription today without complaint.  Will continue to monitor for progression.    Dr. Emily Filbert is Medical Director for [place] and [facility].

## 2018-01-05 NOTE — Progress Notes (Signed)
Pulmonary Individual Treatment Plan  Patient Details  Name: Tina Calderon MRN: [id number] Date of Birth: 04-15-1941 Referring Provider:     Pulmonary Rehab from 12/22/2017 in [facility] Re[facility]  Referring Provider  Court Joy MD      Initial Encounter Date:    Pulmonary Rehab from 12/22/2017 in [facility] Cardiac and Pulmonary Rehab  Date  12/22/17      Visit Diagnosis: Interstitial lung disease ([place])  Patient's Home Medications on Admission:  Current Outpatient Medications:  .  aspirin 81 MG tablet, Take 81 mg by mouth daily., Disp: , Rfl:  .  calcium-vitamin D (OSCAL WITH D) 500-200 MG-UNIT per tablet, Take 1 tablet by mouth every morning., Disp: , Rfl:  .  Cranberry 1000 MG CAPS, Take 1 capsule by mouth daily., Disp: , Rfl:  .  ESBRIET 267 MG CAPS, TAKE 3 CAPSULES BY MOUTH THREE TIMES DAILY WITH FOOD, Disp: 270 capsule, Rfl: 3 .  hydrochlorothiazide (HYDRODIURIL) 25 MG tablet, TAKE ONE TABLET BY MOUTH EVERY DAY FOR BLOOD PRESSURE, Disp: 90 tablet, Rfl: 3 .  levothyroxine (SYNTHROID, LEVOTHROID) 88 MCG tablet, TAKE 1 TABLET BY MOUTH EVERY DAY, Disp: 90 tablet, Rfl: 0 .  loratadine (CLARITIN) 10 MG tablet, Take 10 mg by mouth every evening. Reported on 10/13/2015, Disp: , Rfl:  .  Multiple Vitamin (MULTIVITAMIN WITH MINERALS) TABS, Take 1 tablet by mouth every morning. Centrum silver, Disp: , Rfl:  .  omeprazole (PRILOSEC) 20 MG capsule, TAKE 1 CAPSULE (20 MG TOTAL) BY MOUTH DAILY., Disp: 90 capsule, Rfl: 1 .  rosuvastatin (CRESTOR) 5 MG tablet, Take 1 tablet by mouth every evening for cholesterol., Disp: 90 tablet, Rfl: 3  Past Medical History: Past Medical History:  Diagnosis Date  . Anginal pain ([place])   . Cancer ([place])   . CHF (congestive heart failure) ([place]) 01/22/2012  . Chronic fatigue 07/15/2014  . GERD (gastroesophageal reflux disease) 05/12/2017  . Hypertension   . Hypothyroidism   . ILD (interstitial lung disease) ([place]) 02/04/2012  . IPF (idiopathic pulmonary  fibrosis) ([place]) 2013  . Mild intermittent asthma 01/29/2016  . Pelvic relaxation   . Prolapse of bladder   . Shortness of breath   . Thyroid disease     Tobacco Use: Social History   Tobacco Use  Smoking Status Never Smoker  Smokeless Tobacco Never Used    Labs: Recent Review Flowsheet Data    Labs for ITP Cardiac and Pulmonary Rehab Latest Ref Rng & Units 01/21/2012 12/06/2016 12/16/2016 06/18/2017 12/15/2017   Cholestrol 0 - 200 mg/dL 192 237(H) - 229(H) 239(H)   LDLCALC 0 - 99 mg/dL 98 154(H) - 138(H) 149(H)   HDL >39.00 mg/dL 46 51.90 - 51.70 55.10   Trlycerides 0.0 - 149.0 mg/dL 239(H) 152.0(H) - 193.0(H) 175.0(H)   Hemoglobin A1c 4.6 - 6.5 % - - 6.3 6.4 6.4       Pulmonary Assessment Scores: Pulmonary Assessment Scores    Row Name 12/22/17 1430         ADL UCSD   ADL Phase  Entry     SOB Score total  61     Rest  0     Walk  3     Stairs  4     Bath  3     Dress  2     Shop  3       CAT Score   CAT Score  14       mMRC Score  mMRC Score  3        Pulmonary Function Assessment: Pulmonary Function Assessment - 12/22/17 1451      Breath   Bilateral Breath Sounds  Clear    Shortness of Breath  Limiting activity;Yes       Exercise Target Goals:    Exercise Program Goal: Individual exercise prescription set using results from initial 6 min walk test and THRR while considering  patient's activity barriers and safety.    Exercise Prescription Goal: Initial exercise prescription builds to 30-45 minutes a day of aerobic activity, 2-3 days per week.  Home exercise guidelines will be given to patient during program as part of exercise prescription that the participant will acknowledge.  Activity Barriers & Risk Stratification: Activity Barriers & Cardiac Risk Stratification - 12/22/17 1529      Activity Barriers & Cardiac Risk Stratification   Activity Barriers  Deconditioning;Muscular Weakness;Shortness of Breath       6 Minute Walk: 6 Minute Walk     Row Name 12/22/17 1527         6 Minute Walk   Phase  Initial     Distance  1467 feet     Walk Time  6 minutes     # of Rest Breaks  0     MPH  2.78     METS  3.48     RPE  13     Perceived Dyspnea   2     VO2 Peak  12.17     Symptoms  Yes (comment)     Comments  SOB, leg fatigue in thighs (burning)     Resting HR  95 bpm     Resting BP  126/64     Resting Oxygen Saturation   96 %     Exercise Oxygen Saturation  during 6 min walk  87 %     Max Ex. HR  148 bpm     Max Ex. BP  144/66     2 Minute Post BP  124/68       Interval HR   1 Minute HR  126     2 Minute HR  139     3 Minute HR  145     4 Minute HR  148     5 Minute HR  144     6 Minute HR  143     2 Minute Post HR  109     Interval Heart Rate?  Yes       Interval Oxygen   Interval Oxygen?  Yes     Baseline Oxygen Saturation %  96 %     1 Minute Oxygen Saturation %  91 %     1 Minute Liters of Oxygen  0 L Room Air     2 Minute Oxygen Saturation %  89 %     2 Minute Liters of Oxygen  0 L     3 Minute Oxygen Saturation %  88 %     3 Minute Liters of Oxygen  0 L     4 Minute Oxygen Saturation %  87 %     4 Minute Liters of Oxygen  0 L     5 Minute Oxygen Saturation %  87 %     5 Minute Liters of Oxygen  0 L     6 Minute Oxygen Saturation %  87 %     6 Minute Liters of Oxygen  0 L  2 Minute Post Oxygen Saturation %  95 %     2 Minute Post Liters of Oxygen  0 L       Oxygen Initial Assessment: Oxygen Initial Assessment - 12/22/17 1455      Home Oxygen   Home Oxygen Device  None    Sleep Oxygen Prescription  None    Home Exercise Oxygen Prescription  None    Home at Rest Exercise Oxygen Prescription  None      Initial 6 min Walk   Oxygen Used  None      Program Oxygen Prescription   Program Oxygen Prescription  None      Intervention   Short Term Goals  To learn and understand importance of monitoring SPO2 with pulse oximeter and demonstrate accurate use of the pulse oximeter.;To learn and  understand importance of maintaining oxygen saturations>88%;To learn and demonstrate proper pursed lip breathing techniques or other breathing techniques.;To learn and demonstrate proper use of respiratory medications    Long  Term Goals  Verbalizes importance of monitoring SPO2 with pulse oximeter and return demonstration;Maintenance of O2 saturations>88%;Exhibits proper breathing techniques, such as pursed lip breathing or other method taught during program session;Compliance with respiratory medication;Demonstrates proper use of MDI's       Oxygen Re-Evaluation: Oxygen Re-Evaluation    Row Name 12/29/17 1046             Program Oxygen Prescription   Program Oxygen Prescription  None         Home Oxygen   Home Oxygen Device  None       Sleep Oxygen Prescription  None       Home Exercise Oxygen Prescription  None       Home at Rest Exercise Oxygen Prescription  None         Goals/Expected Outcomes   Short Term Goals  To learn and understand importance of monitoring SPO2 with pulse oximeter and demonstrate accurate use of the pulse oximeter.;To learn and understand importance of maintaining oxygen saturations>88%;To learn and demonstrate proper pursed lip breathing techniques or other breathing techniques.;To learn and demonstrate proper use of respiratory medications       Long  Term Goals  Verbalizes importance of monitoring SPO2 with pulse oximeter and return demonstration;Maintenance of O2 saturations>88%;Exhibits proper breathing techniques, such as pursed lip breathing or other method taught during program session;Compliance with respiratory medication;Demonstrates proper use of MDI's       Comments  Reviewed PLB technique with pt.  Talked about how it work and it's important to maintaining his exercise saturations.         Goals/Expected Outcomes  Short: Become more profiecient at using PLB.   Long: Become independent at using PLB.          Oxygen Discharge (Final Oxygen  Re-Evaluation): Oxygen Re-Evaluation - 12/29/17 1046      Program Oxygen Prescription   Program Oxygen Prescription  None      Home Oxygen   Home Oxygen Device  None    Sleep Oxygen Prescription  None    Home Exercise Oxygen Prescription  None    Home at Rest Exercise Oxygen Prescription  None      Goals/Expected Outcomes   Short Term Goals  To learn and understand importance of monitoring SPO2 with pulse oximeter and demonstrate accurate use of the pulse oximeter.;To learn and understand importance of maintaining oxygen saturations>88%;To learn and demonstrate proper pursed lip breathing techniques or other breathing techniques.;To learn and  demonstrate proper use of respiratory medications    Long  Term Goals  Verbalizes importance of monitoring SPO2 with pulse oximeter and return demonstration;Maintenance of O2 saturations>88%;Exhibits proper breathing techniques, such as pursed lip breathing or other method taught during program session;Compliance with respiratory medication;Demonstrates proper use of MDI's    Comments  Reviewed PLB technique with pt.  Talked about how it work and it's important to maintaining his exercise saturations.      Goals/Expected Outcomes  Short: Become more profiecient at using PLB.   Long: Become independent at using PLB.       Initial Exercise Prescription: Initial Exercise Prescription - 12/22/17 1600      Date of Initial Exercise RX and Referring Provider   Date  12/22/17    Referring Provider  Court Joy MD      Treadmill   MPH  2.5    Grade  0.5    Minutes  15    METs  3.09      NuStep   Level  2    SPM  80    Minutes  15    METs  2.5      REL-XR   Level  1    Speed  50    Minutes  15    METs  2.5      Prescription Details   Frequency (times per week)  3    Duration  Progress to 45 minutes of aerobic exercise without signs/symptoms of physical distress      Intensity   THRR 40-80% of Max Heartrate  114-133    Ratings of  Perceived Exertion  11-13    Perceived Dyspnea  0-4      Progression   Progression  Continue to progress workloads to maintain intensity without signs/symptoms of physical distress.      Resistance Training   Training Prescription  Yes    Weight  3 lbs    Reps  10-15       Perform Capillary Blood Glucose checks as needed.  Exercise Prescription Changes: Exercise Prescription Changes    Row Name 12/22/17 1500             Response to Exercise   Blood Pressure (Admit)  126/64       Blood Pressure (Exercise)  144/66       Blood Pressure (Exit)  124/68       Heart Rate (Admit)  95 bpm       Heart Rate (Exercise)  148 bpm       Heart Rate (Exit)  109 bpm       Oxygen Saturation (Admit)  96 %       Oxygen Saturation (Exercise)  87 %       Oxygen Saturation (Exit)  95 %       Rating of Perceived Exertion (Exercise)  13       Perceived Dyspnea (Exercise)  2       Symptoms  SOB, leg fatigue in thighs (burning)       Comments  walk test results          Exercise Comments: Exercise Comments    Row Name 12/29/17 1048           Exercise Comments   First full day of exercise!  Patient was oriented to gym and equipment including functions, settings, policies, and procedures.  Patient's individual exercise prescription and treatment plan were reviewed.  All starting workloads were established based  on the results of the 6 minute walk test done at initial orientation visit.  The plan for exercise progression was also introduced and progression will be customized based on patient's performance and goals.          Exercise Goals and Review: Exercise Goals    Row Name 12/22/17 1530             Exercise Goals   Increase Physical Activity  Yes       Intervention  Provide advice, education, support and counseling about physical activity/exercise needs.;Develop an individualized exercise prescription for aerobic and resistive training based on initial evaluation findings, risk  stratification, comorbidities and participant's personal goals.       Expected Outcomes  Short Term: Attend rehab on a regular basis to increase amount of physical activity.;Long Term: Add in home exercise to make exercise part of routine and to increase amount of physical activity.;Long Term: Exercising regularly at least 3-5 days a week.       Increase Strength and Stamina  Yes       Intervention  Provide advice, education, support and counseling about physical activity/exercise needs.;Develop an individualized exercise prescription for aerobic and resistive training based on initial evaluation findings, risk stratification, comorbidities and participant's personal goals.       Expected Outcomes  Short Term: Perform resistance training exercises routinely during rehab and add in resistance training at home;Short Term: Increase workloads from initial exercise prescription for resistance, speed, and METs.;Long Term: Improve cardiorespiratory fitness, muscular endurance and strength as measured by increased METs and functional capacity (6MWT)       Able to understand and use rate of perceived exertion (RPE) scale  Yes       Intervention  Provide education and explanation on how to use RPE scale       Expected Outcomes  Short Term: Able to use RPE daily in rehab to express subjective intensity level;Long Term:  Able to use RPE to guide intensity level when exercising independently       Able to understand and use Dyspnea scale  Yes       Intervention  Provide education and explanation on how to use Dyspnea scale       Expected Outcomes  Short Term: Able to use Dyspnea scale daily in rehab to express subjective sense of shortness of breath during exertion;Long Term: Able to use Dyspnea scale to guide intensity level when exercising independently       Knowledge and understanding of Target Heart Rate Range (THRR)  Yes       Intervention  Provide education and explanation of THRR including how the numbers  were predicted and where they are located for reference       Expected Outcomes  Short Term: Able to state/look up THRR;Short Term: Able to use daily as guideline for intensity in rehab;Long Term: Able to use THRR to govern intensity when exercising independently       Able to check pulse independently  Yes       Intervention  Provide education and demonstration on how to check pulse in carotid and radial arteries.;Review the importance of being able to check your own pulse for safety during independent exercise       Expected Outcomes  Short Term: Able to explain why pulse checking is important during independent exercise;Long Term: Able to check pulse independently and accurately       Understanding of Exercise Prescription  Yes  Intervention  Provide education, explanation, and written materials on patient's individual exercise prescription       Expected Outcomes  Long Term: Able to explain home exercise prescription to exercise independently;Short Term: Able to explain program exercise prescription          Exercise Goals Re-Evaluation : Exercise Goals Re-Evaluation    [place] Name 12/29/17 1044             Exercise Goal Re-Evaluation   Exercise Goals Review  Increase Physical Activity;Increase Strength and Stamina;Able to understand and use rate of perceived exertion (RPE) scale;Able to check pulse independently;Knowledge and understanding of Target Heart Rate Range (THRR);Able to understand and use Dyspnea scale;Understanding of Exercise Prescription       Comments  Reviewed RPE scale, THR and program prescription with pt today.  Pt voiced understanding and was given a copy of goals to take home.        Expected Outcomes  Short: Use RPE daily to regulate intensity. Long: Follow program prescription in THR.          Discharge Exercise Prescription (Final Exercise Prescription Changes): Exercise Prescription Changes - 12/22/17 1500      Response to Exercise   Blood Pressure  (Admit)  126/64    Blood Pressure (Exercise)  144/66    Blood Pressure (Exit)  124/68    Heart Rate (Admit)  95 bpm    Heart Rate (Exercise)  148 bpm    Heart Rate (Exit)  109 bpm    Oxygen Saturation (Admit)  96 %    Oxygen Saturation (Exercise)  87 %    Oxygen Saturation (Exit)  95 %    Rating of Perceived Exertion (Exercise)  13    Perceived Dyspnea (Exercise)  2    Symptoms  SOB, leg fatigue in thighs (burning)    Comments  walk test results       Nutrition:  Target Goals: Understanding of nutrition guidelines, daily intake of sodium <1573m, cholesterol <2021m calories 30% from fat and 7% or less from saturated fats, daily to have 5 or more servings of fruits and vegetables.  Biometrics: Pre Biometrics - 12/22/17 1531      Pre Biometrics   Height  5' 2.1" (1.577 m)    Weight  127 lb 11.2 oz (57.9 kg)    Waist Circumference  30 inches    Hip Circumference  35 inches    Waist to Hip Ratio  0.86 %    BMI (Calculated)  23.29    Single Leg Stand  30 seconds        Nutrition Therapy Plan and Nutrition Goals: Nutrition Therapy & Goals - 12/22/17 1449      Personal Nutrition Goals   Nutrition Goal  Lose some weight.    Personal Goal #2  Meet with the dietician.    Comments  Sometimes she gets tired and does not cook and will make a frozen. She tends to eat healthy when she can cook.      Intervention Plan   Intervention  Nutrition handout(s) given to patient.;Prescribe, educate and counsel regarding individualized specific dietary modifications aiming towards targeted core components such as weight, hypertension, lipid management, diabetes, heart failure and other comorbidities.    Expected Outcomes  Short Term Goal: Understand basic principles of dietary content, such as calories, fat, sodium, cholesterol and nutrients.;Long Term Goal: Adherence to prescribed nutrition plan.       Nutrition Assessments: Nutrition Assessments - 12/22/17 1434  MEDFICTS Scores    Pre Score  21       Nutrition Goals Re-Evaluation:   Nutrition Goals Discharge (Final Nutrition Goals Re-Evaluation):   Psychosocial: Target Goals: Acknowledge presence or absence of significant depression and/or stress, maximize coping skills, provide positive support system. Participant is able to verbalize types and ability to use techniques and skills needed for reducing stress and depression.   Initial Review & Psychosocial Screening: Initial Psych Review & Screening - 12/22/17 1446      Initial Review   Current issues with  Current Sleep Concerns;Current Stress Concerns    Source of Stress Concerns  Chronic Illness;Unable to perform yard/household activities    Comments  Her house work is hard to due with her shortness of breath. Her ILD is making it stressful on her. 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      Family Dynamics   Good Support System?  Yes    Comments  She can look to her sister and daughter. She does a therapy group for pulmonary fibrosis.       Barriers   Psychosocial barriers to participate in program  The patient should benefit from training in stress management and relaxation.      Screening Interventions   Interventions  To provide support and resources with identified psychosocial needs;Encouraged to exercise;Program counselor consult;Provide feedback about the scores to participant    Expected Outcomes  Short Term goal: Utilizing psychosocial counselor, staff and physician to assist with identification of specific Stressors or current issues interfering with healing process. Setting desired goal for each stressor or current issue identified.;Long Term Goal: Stressors or current issues are controlled or eliminated.;Short Term goal: Identification and review with participant of any Quality of Life or Depression concerns found by scoring the questionnaire.;Long Term goal: The participant improves quality of Life and PHQ9 Scores as seen by post scores  and/or verbalization of changes       Quality of Life Scores:  Scores of 19 and below usually indicate a poorer quality of life in these areas.  A difference of  2-3 points is a clinically meaningful difference.  A difference of 2-3 points in the total score of the Quality of Life Index has been associated with significant improvement in overall quality of life, self-image, physical symptoms, and general health in studies assessing change in quality of life.  PHQ-9: Recent Review Flowsheet Data    Depression screen [facility] 2/9 12/22/2017 12/15/2017 12/06/2016   Decreased Interest 1 0 0   Down, Depressed, Hopeless 1 0 0   PHQ - 2 Score 2 0 0   Altered sleeping 2 0 -   Tired, decreased energy 2 0 -   Change in appetite 2 0 -   Feeling bad or failure about yourself  1 0 -   Trouble concentrating 1 0 -   Moving slowly or fidgety/restless 0 0 -   Suicidal thoughts 0 0 -   PHQ-9 Score 10 0 -   Difficult doing work/chores Somewhat difficult Not difficult at all -     Interpretation of Total Score  Total Score Depression Severity:  1-4 = Minimal depression, 5-9 = Mild depression, 10-14 = Moderate depression, 15-19 = Moderately severe depression, 20-27 = Severe depression   Psychosocial Evaluation and Intervention: Psychosocial Evaluation - 12/29/17 1034      Psychosocial Evaluation & Interventions   Interventions  Encouraged to exercise with the program and follow exercise prescription    Comments  Counselor met with Tina Calderon Tina Calderon) today for initial psychosocial evaluation.  She is a 77 year old who has had Pulmonary Fibrosis 6 years and this is the 2nd time in this program during that period of time.  Tina Calderon has a very strong support system with a spouse of 28 years; (2) adult children who live locally; extended family and active involvement in her local church.  Tina Calderon reports sleeping well most of the time (occasionally has trouble falling asleep).  Her appetite is good.  Tina Calderon denies a  history of depression or anxiety or any current symptoms and states her mood is positive most of the time.  She has minimal stress in her life other than her health and that of her spouse - who has COPD and is confined to a wheelchair.  Tina Calderon has goals to increase her stamina and stability and would like to improve her breathing while in this program.  She mentioned losing "a little weight" but this is not a primary goals for her.  Staff will follow with her.    Expected Outcomes  Short:  Tina Calderon will exercise consistently for her health and stress.  She will meet with the dietician for any weight loss considerations she has.    Long:  Tina Calderon will practice the principles and strategies taught while in this program in the future to manage her condition more positively.      Continue Psychosocial Services   Follow up required by staff       Psychosocial Re-Evaluation:   Psychosocial Discharge (Final Psychosocial Re-Evaluation):   Education: Education Goals: Education classes will be provided on a weekly basis, covering required topics. Participant will state understanding/return demonstration of topics presented.  Learning Barriers/Preferences: Learning Barriers/Preferences - 12/22/17 1452      Learning Barriers/Preferences   Learning Barriers  Sight wears glasses    Learning Preferences  None       Education Topics:  Initial Evaluation Education: - Verbal, written and demonstration of respiratory meds, oximetry and breathing techniques. Instruction on use of nebulizers and MDIs and importance of monitoring MDI activations.   Pulmonary Rehab from 12/29/2017 in [facility] Cardiac and Pulmonary Rehab  Date  12/22/17  Educator  [facility]  Instruction Review Code  1- Verbalizes Understanding      General Nutrition Guidelines/Fats and Fiber: -Group instruction provided by verbal, written material, models and posters to present the general guidelines for heart healthy nutrition. Gives an explanation and  review of dietary fats and fiber.   Controlling Sodium/Reading Food Labels: -Group verbal and written material supporting the discussion of sodium use in heart healthy nutrition. Review and explanation with models, verbal and written materials for utilization of the food label.   Pulmonary Rehab from 12/29/2017 in [facility]iac and Pulmonary Rehab  Date  12/29/17  Educator  CR  Instruction Review Code  1- Verbalizes Understanding      Exercise Physiology & General Exercise Guidelines: - Group verbal and written instruction with models to review the exercise physiology of the cardiovascular system and associated critical values. Provides general exercise guidelines with specific guidelines to those with heart or lung disease.    Aerobic Exercise & Resistance Training: - Gives group verbal and written instruction on the various components of exercise. Focuses on aerobic and resistive training programs and the benefits of this training and how to safely progress through these programs.   Flexibility, Balance, Mind/Body Relaxation: Provides group verbal/written instruction on the benefits of flexibility and balance training, including mind/body  exercise modes such as yoga, pilates and tai chi.  Demonstration and skill practice provided.   Stress and Anxiety: - Provides group verbal and written instruction about the health risks of elevated stress and causes of high stress.  Discuss the correlation between heart/lung disease and anxiety and treatment options. Review healthy ways to manage with stress and anxiety.   Depression: - Provides group verbal and written instruction on the correlation between heart/lung disease and depressed mood, treatment options, and the stigmas associated with seeking treatment.   Exercise & Equipment Safety: - Individual verbal instruction and demonstration of equipment use and safety with use of the equipment.   Pulmonary Rehab from 12/29/2017 in [facility] Cardiac  and Pulmonary Rehab  Date  12/22/17  Educator  [facility]  Instruction Review Code  1- Verbalizes Understanding      Infection Prevention: - Provides verbal and written material to individual with discussion of infection control including proper hand washing and proper equipment cleaning during exercise session.   Pulmonary Rehab from 12/29/2017 in [facility] Cardiac and Pulmonary Rehab  Date  12/22/17  Educator  [facility]  Instruction Review Code  1- Verbalizes Understanding      Falls Prevention: - Provides verbal and written material to individual with discussion of falls prevention and safety.   Pulmonary Rehab from 12/29/2017 in [facility] Cardiac and Pulmonary Rehab  Date  12/22/17  Educator  [facility]  Instruction Review Code  1- Verbalizes Understanding      Diabetes: - Individual verbal and written instruction to review signs/symptoms of diabetes, desired ranges of glucose level fasting, after meals and with exercise. Advice that pre and post exercise glucose checks will be done for 3 sessions at entry of program.   Chronic Lung Diseases: - Group verbal and written instruction to review updates, respiratory medications, advancements in procedures and treatments. Discuss use of supplemental oxygen including available portable oxygen systems, continuous and intermittent flow rates, concentrators, personal use and safety guidelines. Review proper use of inhaler and spacers. Provide informative websites for self-education.    Energy Conservation: - Provide group verbal and written instruction for methods to conserve energy, plan and organize activities. Instruct on pacing techniques, use of adaptive equipment and posture/positioning to relieve shortness of breath.   Triggers and Exacerbations: - Group verbal and written instruction to review types of environmental triggers and ways to prevent exacerbations. Discuss weather changes, air quality and the benefits of nasal washing. Review warning signs and  symptoms to help prevent infections. Discuss techniques for effective airway clearance, coughing, and vibrations.   AED/CPR: - Group verbal and written instruction with the use of models to demonstrate the basic use of the AED with the basic ABC's of resuscitation.   Anatomy and Physiology of the Lungs: - Group verbal and written instruction with the use of models to provide basic lung anatomy and physiology related to function, structure and complications of lung disease.   Anatomy & Physiology of the Heart: - Group verbal and written instruction and models provide basic cardiac anatomy and physiology, with the coronary electrical and arterial systems. Review of Valvular disease and Heart Failure   Cardiac Medications: - Group verbal and written instruction to review commonly prescribed medications for heart disease. Reviews the medication, class of the drug, and side effects.   Know Your Numbers and Risk Factors: -Group verbal and written instruction about important numbers in your health.  Discussion of what are risk factors and how they play a role in the disease process.  Review of Cholesterol, Blood Pressure, Diabetes, and BMI and the role they play in your overall health.   Sleep Hygiene: -Provides group verbal and written instruction about how sleep can affect your health.  Define sleep hygiene, discuss sleep cycles and impact of sleep habits. Review good sleep hygiene tips.    Other: -Provides group and verbal instruction on various topics (see comments)    Knowledge Questionnaire Score: Knowledge Questionnaire Score - 12/22/17 1435      Knowledge Questionnaire Score   Pre Score  17/18 reviewed with patient        Core Components/Risk Factors/Patient Goals at Admission: Personal Goals and Risk Factors at Admission - 12/22/17 1458      Core Components/Risk Factors/Patient Goals on Admission    Weight Management  Yes;Weight Maintenance    Intervention  Weight  Management: Develop a combined nutrition and exercise program designed to reach desired caloric intake, while maintaining appropriate intake of nutrient and fiber, sodium and fats, and appropriate energy expenditure required for the weight goal.;Weight Management: Provide education and appropriate resources to help participant work on and attain dietary goals.;Weight Management/Obesity: Establish reasonable short term and long term weight goals.    Admit Weight  127 lb 11.2 oz (57.9 kg)    Goal Weight: Short Term  127 lb (57.6 kg)    Goal Weight: Long Term  127 lb (57.6 kg)    Expected Outcomes  Short Term: Continue to assess and modify interventions until short term weight is achieved;Long Term: Adherence to nutrition and physical activity/exercise program aimed toward attainment of established weight goal;Weight Maintenance: Understanding of the daily nutrition guidelines, which includes 25-35% calories from fat, 7% or less cal from saturated fats, less than 273m cholesterol, less than 1.5gm of sodium, & 5 or more servings of fruits and vegetables daily;Understanding recommendations for meals to include 15-35% energy as protein, 25-35% energy from fat, 35-60% energy from carbohydrates, less than 2062mof dietary cholesterol, 20-35 gm of total fiber daily;Understanding of distribution of calorie intake throughout the day with the consumption of 4-5 meals/snacks    Improve shortness of breath with ADL's  Yes    Intervention  Provide education, individualized exercise plan and daily activity instruction to help decrease symptoms of SOB with activities of daily living.    Expected Outcomes  Short Term: Improve cardiorespiratory fitness to achieve a reduction of symptoms when performing ADLs;Long Term: Be able to perform more ADLs without symptoms or delay the onset of symptoms    Hypertension  Yes    Intervention  Provide education on lifestyle modifcations including regular physical activity/exercise,  weight management, moderate sodium restriction and increased consumption of fresh fruit, vegetables, and low fat dairy, alcohol moderation, and smoking cessation.;Monitor prescription use compliance.    Expected Outcomes  Short Term: Continued assessment and intervention until BP is < 140/9092mG in hypertensive participants. < 130/70m31m in hypertensive participants with diabetes, heart failure or chronic kidney disease.;Long Term: Maintenance of blood pressure at goal levels.       Core Components/Risk Factors/Patient Goals Review:    Core Components/Risk Factors/Patient Goals at Discharge (Final Review):    ITP Comments: ITP Comments    Row Name 12/22/17 1416 01/05/18 0854         ITP Comments  Medical Evaluation completed. Chart sent for review and changes to Dr. MarkEmily Filbertector of [facility] can be found in [facility] encounter 12/01/2017  30 day review completed. ITP sent to Dr. MarkEmily Filbertector of  [facility]. Continue with ITP unless changes are made by physician         Comments: 30 day review

## 2018-01-07 DIAGNOSIS — J849 Interstitial pulmonary disease, unspecified: Secondary | ICD-10-CM | POA: Diagnosis not present

## 2018-01-07 NOTE — Progress Notes (Signed)
Daily Session Note  Patient Details  Name: Tina Calderon MRN: [id number] Date of Birth: 04-03-1941 Referring Provider:     Pulmonary Rehab from 12/22/2017 in [facility] Cardiac and Pulmonary Rehab  Referring Provider  Court Joy MD      Encounter Date: 01/07/2018  Check In: Session Check In - 01/07/18 1013      Check-In   Supervising physician immediately available to respond to emergencies  [facility] immediately available ER MD    Physician(s)  Cinda Quest and Quentin Cornwall    Location  [facility]-Cardiac & Pulmonary Rehab    Staff Present  Justin Mend Lorre Nick, MA, RCEP, CCRP, Exercise Physiologist;Amanda Oletta Darter, [place], ACSM CEP, Exercise Physiologist    Medication changes reported      No    Fall or balance concerns reported     No    Warm-up and Cool-down  Performed on first and last piece of equipment    Resistance Training Performed  Yes    VAD Patient?  No    PAD/SET Patient?  No      Pain Assessment   Currently in Pain?  No/denies    Multiple Pain Sites  No          Social History   Tobacco Use  Smoking Status Never Smoker  Smokeless Tobacco Never Used    Goals Met:  Proper associated with RPD/PD & O2 Sat Independence with exercise equipment Exercise tolerated well Strength training completed today  Goals Unmet:  Not Applicable  Comments: Pt able to follow exercise prescription today without complaint.  Will continue to monitor for progression.    Dr. Emily Filbert is Medical Director for [place] and [facility].

## 2018-01-09 ENCOUNTER — Encounter: Payer: Medicare Other | Attending: Emergency Medicine | Admitting: *Deleted

## 2018-01-09 DIAGNOSIS — I110 Hypertensive heart disease with heart failure: Secondary | ICD-10-CM | POA: Diagnosis not present

## 2018-01-09 DIAGNOSIS — Z79899 Other long term (current) drug therapy: Secondary | ICD-10-CM | POA: Insufficient documentation

## 2018-01-09 DIAGNOSIS — E039 Hypothyroidism, unspecified: Secondary | ICD-10-CM | POA: Insufficient documentation

## 2018-01-09 DIAGNOSIS — Z7982 Long term (current) use of aspirin: Secondary | ICD-10-CM | POA: Insufficient documentation

## 2018-01-09 DIAGNOSIS — K219 Gastro-esophageal reflux disease without esophagitis: Secondary | ICD-10-CM | POA: Insufficient documentation

## 2018-01-09 DIAGNOSIS — J4520 Mild intermittent asthma, uncomplicated: Secondary | ICD-10-CM | POA: Insufficient documentation

## 2018-01-09 DIAGNOSIS — J849 Interstitial pulmonary disease, unspecified: Secondary | ICD-10-CM | POA: Insufficient documentation

## 2018-01-09 DIAGNOSIS — Z79890 Hormone replacement therapy (postmenopausal): Secondary | ICD-10-CM | POA: Insufficient documentation

## 2018-01-09 DIAGNOSIS — I509 Heart failure, unspecified: Secondary | ICD-10-CM | POA: Insufficient documentation

## 2018-01-09 NOTE — Progress Notes (Signed)
Daily Session Note  Patient Details  Name: Tina Calderon MRN: [id number] Date of Birth: 01/24/1941 Referring Provider:     Pulmonary Rehab from 12/22/2017 in [facility] Cardiac and Pulmonary Rehab  Referring Provider  Court Joy MD      Encounter Date: 01/09/2018  Check In: Session Check In - 01/09/18 1044      Check-In   Supervising physician immediately available to respond to emergencies  [facility] immediately available ER MD    Physician(s)  Dr. Cherylann Banas and Jacqualine Code     Location  [facility]-Cardiac & Pulmonary Rehab    Staff Present  Justin Mend RCP,RRT,BSRT;Hamza Empson Sherryll Burger, RN Vickki Hearing, BA, ACSM CEP, Exercise Physiologist    Medication changes reported      No    Fall or balance concerns reported     No    Tobacco Cessation  No Change    Warm-up and Cool-down  Performed as group-led instruction    Resistance Training Performed  Yes    VAD Patient?  No      Pain Assessment   Currently in Pain?  No/denies          Social History   Tobacco Use  Smoking Status Never Smoker  Smokeless Tobacco Never Used    Goals Met:  Proper associated with RPD/PD & O2 Sat Independence with exercise equipment Using PLB without cueing & demonstrates good technique Exercise tolerated well No report of cardiac concerns or symptoms Strength training completed today  Goals Unmet:  Not Applicable  Comments: Pt able to follow exercise prescription today without complaint.  Will continue to monitor for progression.    Dr. Emily Filbert is Medical Director for [place] and [facility].

## 2018-01-09 NOTE — Progress Notes (Signed)
Daily Session Note  Patient Details  Name: Tina Calderon MRN: [id number] Date of Birth: 1940/12/16 Referring Provider:     Pulmonary Rehab from 12/22/2017 in [facility] Cardiac and Pulmonary Rehab  Referring Provider  Court Joy MD      Encounter Date: 01/09/2018  Check In: Session Check In - 01/09/18 1044      Check-In   Supervising physician immediately available to respond to emergencies  [facility] immediately available ER MD    Physician(s)  Dr. Cherylann Banas and Jacqualine Code     Location  [facility]-Cardiac & Pulmonary Rehab    Staff Present  Justin Mend RCP,RRT,BSRT;Meredith Sherryll Burger, RN Vickki Hearing, BA, ACSM CEP, Exercise Physiologist    Medication changes reported      No    Fall or balance concerns reported     No    Tobacco Cessation  No Change    Warm-up and Cool-down  Performed as group-led instruction    Resistance Training Performed  Yes    VAD Patient?  No      Pain Assessment   Currently in Pain?  No/denies          Social History   Tobacco Use  Smoking Status Never Smoker  Smokeless Tobacco Never Used    Goals Met:  Proper associated with RPD/PD & O2 Sat Independence with exercise equipment Exercise tolerated well Strength training completed today  Goals Unmet:  Not Applicable  Comments: Reviewed home exercise with pt today.  Pt plans to walk and consider [place] for exercise.  Reviewed THR, pulse, RPE, sign and symptoms, NTG use, and when to call 911 or MD.  Also discussed weather considerations and indoor options.  Pt voiced understanding.    Dr. Emily Filbert is Medical Director for [place] and [facility].

## 2018-01-12 ENCOUNTER — Encounter: Payer: Medicare Other | Admitting: *Deleted

## 2018-01-12 ENCOUNTER — Emergency Department
Admission: EM | Admit: 2018-01-12 | Discharge: 2018-01-12 | Disposition: A | Discharge status: Home / Self Care | Payer: Medicare Other | Attending: Emergency Medicine | Admitting: Emergency Medicine

## 2018-01-12 ENCOUNTER — Emergency Department: Payer: Medicare Other

## 2018-01-12 ENCOUNTER — Encounter: Payer: Self-pay | Admitting: Emergency Medicine

## 2018-01-12 ENCOUNTER — Other Ambulatory Visit: Payer: Self-pay

## 2018-01-12 ENCOUNTER — Ambulatory Visit: Payer: Self-pay

## 2018-01-12 DIAGNOSIS — J4520 Mild intermittent asthma, uncomplicated: Secondary | ICD-10-CM | POA: Insufficient documentation

## 2018-01-12 DIAGNOSIS — J849 Interstitial pulmonary disease, unspecified: Secondary | ICD-10-CM

## 2018-01-12 DIAGNOSIS — R5381 Other malaise: Secondary | ICD-10-CM | POA: Insufficient documentation

## 2018-01-12 DIAGNOSIS — Z79899 Other long term (current) drug therapy: Secondary | ICD-10-CM | POA: Insufficient documentation

## 2018-01-12 DIAGNOSIS — I509 Heart failure, unspecified: Secondary | ICD-10-CM | POA: Diagnosis not present

## 2018-01-12 DIAGNOSIS — I110 Hypertensive heart disease with heart failure: Secondary | ICD-10-CM | POA: Diagnosis not present

## 2018-01-12 DIAGNOSIS — Z859 Personal history of malignant neoplasm, unspecified: Secondary | ICD-10-CM | POA: Diagnosis not present

## 2018-01-12 DIAGNOSIS — R000 Tachycardia, unspecified: Secondary | ICD-10-CM | POA: Diagnosis not present

## 2018-01-12 DIAGNOSIS — Z7982 Long term (current) use of aspirin: Secondary | ICD-10-CM | POA: Diagnosis not present

## 2018-01-12 LAB — BASIC METABOLIC PANEL
Anion gap: 11 (ref 5–15)
BUN: 16 mg/dL (ref 8–23)
CALCIUM: 9.1 mg/dL (ref 8.9–10.3)
CO2: 28 mmol/L (ref 22–32)
Chloride: 91 mmol/L — ABNORMAL LOW (ref 98–111)
Creatinine, Ser: 0.89 mg/dL (ref 0.44–1.00)
GFR calc Af Amer: >60 mL/min (ref >60)
GFR calc non Af Amer: >60 mL/min (ref >60)
Glucose, Bld: 117 mg/dL — ABNORMAL HIGH (ref 70–99)
Potassium: 3.8 mmol/L (ref 3.5–5.1)
SODIUM: 130 mmol/L — AB (ref 135–145)

## 2018-01-12 LAB — CBC
HCT: 35.1 % (ref 35.0–47.0)
Hemoglobin: 11.7 g/dL — ABNORMAL LOW (ref 12.0–16.0)
MCH: 26.4 pg (ref 26.0–34.0)
MCHC: 33.4 g/dL (ref 32.0–36.0)
MCV: 78.9 fL — ABNORMAL LOW (ref 80.0–100.0)
PLATELETS: 274 10*3/uL (ref 150–440)
RBC: 4.45 MIL/uL (ref 3.80–5.20)
RDW: 15.2 % — AB (ref 11.5–14.5)
WBC: 7.2 10*3/uL (ref 3.6–11.0)

## 2018-01-12 LAB — URINALYSIS, COMPLETE (UACMP) WITH MICROSCOPIC
BACTERIA UA: NONE SEEN
Bilirubin Urine: NEGATIVE
Glucose, UA: NEGATIVE mg/dL
Hgb urine dipstick: NEGATIVE
Ketones, ur: NEGATIVE mg/dL
Leukocytes, UA: NEGATIVE
Nitrite: NEGATIVE
Protein, ur: NEGATIVE mg/dL
SPECIFIC GRAVITY, URINE: 1.014 (ref 1.005–1.030)
SQUAMOUS EPITHELIAL / LPF: NONE SEEN (ref 0–5)
pH: 6 (ref 5.0–8.0)

## 2018-01-12 LAB — TROPONIN I: Troponin I: <0.03 ng/mL (ref <0.03)

## 2018-01-12 MED ORDER — IOPAMIDOL (ISOVUE-370) INJECTION 76%
75.0000 mL | Freq: Once | INTRAVENOUS | Status: AC | PRN
  Administered 2018-01-12: 75 mL via INTRAVENOUS

## 2018-01-12 MED ORDER — SODIUM CHLORIDE 0.9 % IV BOLUS
500.0000 mL | Freq: Once | INTRAVENOUS | Status: AC
  Administered 2018-01-12: 500 mL via INTRAVENOUS

## 2018-01-12 NOTE — Telephone Encounter (Signed)
Per chart review tab pt is at [facility] ED.

## 2018-01-12 NOTE — ED Notes (Signed)
Patient to ED via [facility] from Pulmonary Rehab with onset of weakness and rapid HR in Rehab.  Pulse irregular upon arrival to ED, patient denies hx of irregular HR, taken to Triage 1 for EKG.  Skin warm and dry, alert and oriented.

## 2018-01-12 NOTE — Telephone Encounter (Signed)
Noted. Patient was evaluated and discharged home. No emergent cause for symptoms.

## 2018-01-12 NOTE — ED Triage Notes (Signed)
Patient reports that her heart rate has felt irregular all weekend and that she just "hasn't felt good". Went to cardiac rehab this morning and her resting heart rate was 125. Patient denies history of irregular rhythm. Reports history of pulmonary fibrosis. Patient alert and oriented, speaking in complete sentences.

## 2018-01-12 NOTE — Progress Notes (Signed)
Incomplete Session Note  Patient Details  Name: JOSEFITA WEISSMANN MRN: [id number] Date of Birth: April 16, 1941 Referring Provider:     Pulmonary Rehab from 12/22/2017 in [place] and Pulmonary Rehab  Referring Provider  Court Joy MD      [name] did not complete her rehab session.  Ilani came to class today. Her heart rate was higher than usual, ranging from 115-120 bpm. She stated that she had not felt good for 2-3 days. She called her doctor's office and talked to the nurse. They advised her to go to the ED for evaluation. She was taken by staff to the ED.

## 2018-01-12 NOTE — Discharge Instructions (Signed)
Your tests today were okay. Your CT scan did not show any signs of pneumonia or blood clots in the lungs.  It's unclear why you aren't feeling well today. Drink lots of fluids and allow yourself extra rest over the next few days. Follow up with your doctor for further evaluation of your symptoms.

## 2018-01-12 NOTE — ED Provider Notes (Signed)
[facility] Emergency Department Provider Note  ____________________________________________  Time seen: Approximately 2:50 PM  I have reviewed the triage vital signs and the nursing notes.   HISTORY  Chief Complaint Shortness of Breath and Palpitations    HPI ILLIANA LOSURDO is a 77 y.o. female with a history of CHF, hypertension, pulmonary fibrosis who complains of malaise and fatigue for the past 3 days.  Also intermittent palpitations and rapid heart rate.  She went to rehab for physical therapy this morning and was found to have a heart rate of about 120.  She was unable to see her doctor today so she came to the hospital for further evaluation.  Denies chest pain or shortness of breath.  Denies cough.  No recent travel trauma hospitalization or surgery.  No history of DVT or PE.      Past Medical History:  Diagnosis Date  . Anginal pain ([place])   . Cancer ([place])   . CHF (congestive heart failure) ([place]) 01/22/2012  . Chronic fatigue 07/15/2014  . GERD (gastroesophageal reflux disease) 05/12/2017  . Hypertension   . Hypothyroidism   . ILD (interstitial lung disease) ([place]) 02/04/2012  . IPF (idiopathic pulmonary fibrosis) ([place]) 2013  . Mild intermittent asthma 01/29/2016  . Pelvic relaxation   . Prolapse of bladder   . Shortness of breath   . Thyroid disease      Patient Active Problem List   Diagnosis Date Noted  . Prediabetes 12/18/2017  . Preventative health care 12/18/2017  . GERD (gastroesophageal reflux disease) 05/12/2017  . Essential hypertension 09/25/2016  . Cough variant asthma 06/13/2016  . Allergic rhinitis 01/29/2016  . Mild intermittent asthma 01/29/2016  . Cough 01/03/2015  . Research subject 08/03/2014  . Chronic fatigue 07/15/2014  . Research study patient 06/28/2014  . IPF (idiopathic pulmonary fibrosis) ([place]) 02/04/2012  . Orthostatic hypotension 01/24/2012  . Normocytic anemia 01/22/2012  . Hyperlipidemia 01/22/2012  .  Anemia 01/20/2012  . Thyroid disease   . Pelvic relaxation 11/11/2011     Past Surgical History:  Procedure Laterality Date  . ANTERIOR AND POSTERIOR REPAIR  12/05/2011   Procedure: ANTERIOR (CYSTOCELE) AND POSTERIOR REPAIR (RECTOCELE);  Surgeon: [name], MD;  Location: [facility];  Service: Gynecology;  Laterality: N/A;  with cysto  . Bladder tact  15 years ago  . LEFT HEART CATHETERIZATION WITH CORONARY ANGIOGRAM N/A 01/22/2012   Procedure: LEFT HEART CATHETERIZATION WITH CORONARY ANGIOGRAM;  Surgeon: [name], MD;  Location: [facility] CATH LAB;  Service: Cardiovascular;  Laterality: N/A;  . NECK SURGERY  2010  . SHOULDER SURGERY    . thyroid disease    . TONSILLECTOMY    . VAGINAL HYSTERECTOMY  15 years ago     Prior to Admission medications   Medication Sig Start Date End Date Taking? Authorizing Provider  aspirin 81 MG tablet Take 81 mg by mouth daily.    [provider]  calcium-vitamin D (OSCAL WITH D) 500-200 MG-UNIT per tablet Take 1 tablet by mouth every morning.    [provider]  Cranberry 1000 MG CAPS Take 1 capsule by mouth daily.    [provider]  ESBRIET 267 MG CAPS TAKE 3 CAPSULES BY MOUTH THREE TIMES DAILY WITH FOOD 07/15/17   Collene Gobble, MD  hydrochlorothiazide (HYDRODIURIL) 25 MG tablet TAKE ONE TABLET BY MOUTH EVERY DAY FOR BLOOD PRESSURE 02/03/17   [name], NP  levothyroxine (SYNTHROID, LEVOTHROID) 88 MCG tablet TAKE 1 TABLET BY MOUTH  EVERY DAY 01/05/18   [name], NP  loratadine (CLARITIN) 10 MG tablet Take 10 mg by mouth every evening. Reported on 10/13/2015    [provider]  Multiple Vitamin (MULTIVITAMIN WITH MINERALS) TABS Take 1 tablet by mouth every morning. Centrum silver    [provider]  omeprazole (PRILOSEC) 20 MG capsule TAKE 1 CAPSULE (20 MG TOTAL) BY MOUTH DAILY. 12/08/17   Collene Gobble, MD  rosuvastatin (CRESTOR) 5 MG tablet Take 1 tablet by mouth every evening for  cholesterol. 12/18/17   [name], NP     Allergies Dilaudid [hydromorphone]   Family History  Problem Relation Age of Onset  . Thyroid disease Mother   . Hypertension Mother   . Coronary artery disease Mother   . Dementia Mother   . Aneurysm Father   . Pulmonary fibrosis Sister   . Stroke Other   . Sleep apnea Other   . Cancer Brother        LIVER  . Pulmonary fibrosis Brother     Social History Social History   Tobacco Use  . Smoking status: Never Smoker  . Smokeless tobacco: Never Used  Substance Use Topics  . Alcohol use: No  . Drug use: No    Review of Systems  Constitutional:   No fever or chills.  ENT:   No sore throat. No rhinorrhea. Cardiovascular:   No chest pain or syncope. Respiratory:   No dyspnea or cough. Gastrointestinal:   Negative for abdominal pain, vomiting and diarrhea.  Musculoskeletal:   Negative for focal pain or swelling All other systems reviewed and are negative except as documented above in ROS and HPI.  ____________________________________________   PHYSICAL EXAM:  VITAL SIGNS: ED Triage Vitals  Enc Vitals Group     BP 01/12/18 1058 131/75     Pulse Rate 01/12/18 1058 90     Resp 01/12/18 1058 20     Temp 01/12/18 1058 98 F (36.7 C)     Temp Source 01/12/18 1058 Oral     SpO2 01/12/18 1058 99 %     Weight 01/12/18 1058 128 lb (58.1 kg)     Height 01/12/18 1058 5' 2" (1.575 m)     Head Circumference --      Peak Flow --      Pain Score 01/12/18 1106 0     Pain Loc --      Pain Edu? --      Excl. in [place]? --     Vital signs reviewed, nursing assessments reviewed.   Constitutional:   Alert and oriented. Non-toxic appearance. Eyes:   Conjunctivae are normal. EOMI. PERRL. ENT      Head:   Normocephalic and atraumatic.      Nose:   No congestion/rhinnorhea.       Mouth/Throat:   MMM, no pharyngeal erythema. No peritonsillar mass.       Neck:   No meningismus. Full ROM. Hematological/Lymphatic/Immunilogical:    No cervical lymphadenopathy. Cardiovascular:   RRR, heart rate 90 on my exam. Symmetric bilateral radial and DP pulses.  No murmurs. Cap refill less than 2 seconds. Respiratory:   Normal respiratory effort without tachypnea/retractions. Breath sounds are clear and equal bilaterally. No wheezes/rales/rhonchi. Gastrointestinal:   Soft and nontender. Non distended. There is no CVA tenderness.  No rebound, rigidity, or guarding. Musculoskeletal:   Normal range of motion in all extremities. No joint effusions.  No lower extremity tenderness.  No edema. Neurologic:  Normal speech and language.  Motor grossly intact. No acute focal neurologic deficits are appreciated.  Skin:    Skin is warm, dry and intact. No rash noted.  No petechiae, purpura, or bullae.  ____________________________________________    LABS (pertinent positives/negatives) (all labs ordered are listed, but only abnormal results are displayed) Labs Reviewed  BASIC METABOLIC PANEL - Abnormal; Notable for the following components:      Result Value   Sodium 130 (*)    Chloride 91 (*)    Glucose, Bld 117 (*)    All other components within normal limits  CBC - Abnormal; Notable for the following components:   Hemoglobin 11.7 (*)    MCV 78.9 (*)    RDW 15.2 (*)    All other components within normal limits  URINALYSIS, COMPLETE (UACMP) WITH MICROSCOPIC - Abnormal; Notable for the following components:   Color, Urine YELLOW (*)    APPearance CLEAR (*)    All other components within normal limits  URINE CULTURE  TROPONIN I   ____________________________________________   EKG  Interpreted by me Sinus tachycardia rate 104, normal axis and intervals.  Poor R wave progression.  Normal ST segments and T waves.  Two Q waves on the strip. ____________________________________________    RADIOLOGY  Dg Chest 2 View  Result Date: 01/12/2018 CLINICAL DATA:  Onset of weakness and tachycardia in rehab. Patient reports not feeling  well for the past 3 days. History of CHF and interstitial lung disease-pulmonary fibrosis, nonsmoker. EXAM: CHEST - 2 VIEW COMPARISON:  CT scan chest of November 28, 2017. FINDINGS: The lungs are adequately inflated. The interstitial markings are coarse bilaterally but stable. The heart is mildly enlarged. The pulmonary vascularity is not engorged. There is a large hiatal hernia-partially intrathoracic stomach. There is no pleural effusion. There is calcification in the wall of the aortic arch. The bony thorax is unremarkable. IMPRESSION: Chronic bronchitic-fibrotic changes, stable.  No acute pneumonia. Stable mild cardiomegaly without pulmonary vascular congestion. Thoracic aortic atherosclerosis. Electronically Signed   By: David  Martinique M.D.   On: 01/12/2018 11:40   Ct Angio Chest Pe W And/or Wo Contrast  Result Date: 01/12/2018 CLINICAL DATA:  Patient reports that her heart rate has felt irregular all weekend and that she just "hasn't felt good". Went to cardiac rehab this morning and her resting heart rate was 125. Patient denies history of irregular rhythm. History of pulmonary fibrosis. EXAM: CT ANGIOGRAPHY CHEST WITH CONTRAST TECHNIQUE: Multidetector CT imaging of the chest was performed using the standard protocol during bolus administration of intravenous contrast. Multiplanar CT image reconstructions and MIPs were obtained to evaluate the vascular anatomy. CONTRAST:  60m ISOVUE-370 IOPAMIDOL (ISOVUE-370) INJECTION 76% COMPARISON:  Chest CT dated 11/28/2017. FINDINGS: Cardiovascular: Some of the most peripheral segmental and subsegmental pulmonary arteries cannot be definitively characterized due to patient breathing motion artifact, however, there is no pulmonary embolism identified within the main, lobar or central segmental pulmonary arteries bilaterally. Cardiomegaly. No pericardial effusion. Coronary artery calcifications. Scattered aortic atherosclerosis. No thoracic aortic aneurysm or evidence of  aortic dissection. Mediastinum/Nodes: Large hiatal hernia. No mass or enlarged lymph nodes seen within the mediastinum or perihilar regions. Trachea and central bronchi are unremarkable. Lungs/Pleura: Lungs are not significantly changed in appearance compared to the previous high-resolution chest CT of 11/28/2017, compatible with the previous description of chronic interstitial lung disease (NSIP), perhaps slightly lower lung volumes accentuating the bibasilar markings with associated bibasilar atelectasis. No evidence of pneumonia. No pleural effusion or pneumothorax. Upper  Abdomen: No acute findings. Musculoskeletal: Mild degenerative spurring within the thoracic spine. No acute or suspicious osseous finding. Review of the MIP images confirms the above findings. IMPRESSION: 1. No acute findings. No pulmonary embolism seen. No evidence of pneumonia or pulmonary edema. 2. 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 3. Large hiatal hernia, stable. 4. Cardiomegaly. 5. Coronary artery calcifications. Aortic Atherosclerosis (ICD10-I70.0). Electronically Signed   By: Franki Cabot M.D.   On: 01/12/2018 14:00    ____________________________________________   PROCEDURES Procedures  ____________________________________________  DIFFERENTIAL DIAGNOSIS   Non-STEMI, pneumonia, pulmonary embolism, dehydration  CLINICAL IMPRESSION / ASSESSMENT AND PLAN / ED COURSE  Pertinent labs & imaging results that were available during my care of the patient were reviewed by me and considered in my medical decision making (see chart for details).    Patient presents with malaise, fatigue, episode of tachycardia.  On EKG monitor this appears to be sinus.  Not A. fib.  Doubt hyperthyroidism or sepsis.  Initial labs and chest x-ray were unremarkable.  Given the lack of other explanatory findings, her past  medical history, the tachycardia, there is enough concern for pulmonary embolism to necessitate a CT scan of the chest.  Fortunately this was negative for any acute process including pneumonia pneumothorax or pulmonary embolism.  I will discharge patient to follow-up with her primary care doctor.  No further cardiac work-up necessary at this time.      ____________________________________________   FINAL CLINICAL IMPRESSION(S) / ED DIAGNOSES    Final diagnoses:  Malaise  Tachycardia     ED Discharge Orders    None      Portions of this note were generated with dragon dictation software. Dictation errors may occur despite best attempts at proofreading.    Carrie Mew, MD 01/12/18 [id number]

## 2018-01-12 NOTE — Telephone Encounter (Signed)
Pt. Is at Pulmonary Rehab - having increased heart rate 115. States they did an EKG and told her to see her PCP today.Denies chest pain. Has some shortness of breath related to her Pulmonary fibrosis. No availability with office. Pt. Will go to ED for evaluation.  Reason for Disposition . Age > 60 years (Exception: brief heart beat symptoms that went away and now feels well)  Answer Assessment - Initial Assessment Questions 1. DESCRIPTION: "Please describe your heart rate or heart beat that you are having" (e.g., fast/slow, regular/irregular, skipped or extra beats, "palpitations")     Fas t heart rate all weekend 2. ONSET: "When did it start?" (Minutes, hours or days)      Friday 3. DURATION: "How long does it last" (e.g., seconds, minutes, hours)     A few minutes 4. PATTERN "Does it come and go, or has it been constant since it started?"  "Does it get worse with exertion?"   "Are you feeling it now?"     Comes and goes 5. TAP: "Using your hand, can you tap out what you are feeling on a chair or table in front of you, so that I can hear?" (Note: not all patients can do this)       115 6. HEART RATE: "Can you tell me your heart rate?" "How many beats in 15 seconds?"  (Note: not all patients can do this)       115 7. RECURRENT SYMPTOM: "Have you ever had this before?" If so, ask: "When was the last time?" and "What happened that time?"      No 8. CAUSE: "What do you think is causing the palpitations?"     My lungs 9. CARDIAC HISTORY: "Do you have any history of heart disease?" (e.g., heart attack, angina, bypass surgery, angioplasty, arrhythmia)      No 10. OTHER SYMPTOMS: "Do you have any other symptoms?" (e.g., dizziness, chest pain, sweating, difficulty breathing)       Pulmonary Fibrosis 11. PREGNANCY: "Is there any chance you are pregnant?" "When was your last menstrual period?"       No  Protocols used: HEART RATE AND HEARTBEAT QUESTIONS-A-AH

## 2018-01-13 LAB — URINE CULTURE: Culture: <10000 — AB

## 2018-01-14 DIAGNOSIS — J849 Interstitial pulmonary disease, unspecified: Secondary | ICD-10-CM

## 2018-01-14 NOTE — Progress Notes (Signed)
Daily Session Note  Patient Details  Name: Tina Calderon MRN: [id number] Date of Birth: 1941-03-07 Referring Provider:     Pulmonary Rehab from 12/22/2017 in [facility] and [facility] Re[facility]  Referring Provider  Court Joy MD      Encounter Date: 01/14/2018  Check In: Session Check In - 01/14/18 1028      Check-In   Supervising physician immediately available to respond to emergencies  [facility] immediately available ER MD    Physician(s)  Lucita Lora and [place]    Location  [facility]-Cardiac & Pulmonary Rehab    Staff Present  Justin Mend Lorre Nick, MA, RCEP, CCRP, Exercise Physiologist;Amanda Oletta Darter, [place], ACSM CEP, Exercise Physiologist    Medication changes reported      No    Fall or balance concerns reported     No    Warm-up and Cool-down  Performed as group-led instruction    Resistance Training Performed  Yes    VAD Patient?  No    PAD/SET Patient?  No      Pain Assessment   Currently in Pain?  No/denies    Multiple Pain Sites  No          Social History   Tobacco Use  Smoking Status Never Smoker  Smokeless Tobacco Never Used    Goals Met:  Proper associated with RPD/PD & O2 Sat Independence with exercise equipment Exercise tolerated well Strength training completed today  Goals Unmet:  Not Applicable  Comments: Pt able to follow exercise prescription today without complaint.  Will continue to monitor for progression.    Dr. Emily Filbert is Medical Director for [place] and [facility].

## 2018-01-16 ENCOUNTER — Encounter: Payer: Medicare Other | Admitting: *Deleted

## 2018-01-16 DIAGNOSIS — J849 Interstitial pulmonary disease, unspecified: Secondary | ICD-10-CM | POA: Diagnosis not present

## 2018-01-16 NOTE — Progress Notes (Signed)
Daily Session Note  Patient Details  Name: Tina Calderon MRN: [id number] Date of Birth: September 11, 1940 Referring Provider:     Pulmonary Rehab from 12/22/2017 in [facility] Card[facility] and Pulmonary Rehab  Referring Provider  Court Joy MD      Encounter Date: 01/16/2018  Check In: Session Check In - 01/16/18 1016      Check-In   Supervising physician immediately available to respond to emergencies  [facility] immediately available ER MD    Physician(s)  Dr. Alfred Levins and [facility]    Location  [facility]-Cardiac & Pulmonary Rehab    Staff Present  Renita Papa, RN BSN;[facility], [place], ACSM CEP, Exercise Physiologist    Medication changes reported      No    Fall or balance concerns reported     No    Tobacco Cessation  No Change    Warm-up and Cool-down  Performed as group-led instruction    Resistance Training Performed  Yes    VAD Patient?  No      Pain Assessment   Currently in Pain?  No/denies          Social History   Tobacco Use  Smoking Status Never Smoker  Smokeless Tobacco Never Used    Goals Met:  Proper associated with RPD/PD & O2 Sat Independence with exercise equipment Using PLB without cueing & demonstrates good technique Exercise tolerated well No report of cardiac concerns or symptoms Strength training completed today  Goals Unmet:  Not Applicable  Comments: Pt able to follow exercise prescription today without complaint.  Will continue to monitor for progression.    Dr. Emily Filbert is Medical Director for [place] and [facility].

## 2018-01-19 ENCOUNTER — Encounter: Payer: Medicare Other | Admitting: *Deleted

## 2018-01-19 ENCOUNTER — Encounter: Payer: Self-pay | Admitting: Primary Care

## 2018-01-19 ENCOUNTER — Ambulatory Visit: Payer: Medicare Other | Admitting: Primary Care

## 2018-01-19 VITALS — BP 120/70 | HR 110 | Temp 98.1°F | Ht 62.0 in | Wt 129.0 lb

## 2018-01-19 DIAGNOSIS — R000 Tachycardia, unspecified: Secondary | ICD-10-CM

## 2018-01-19 DIAGNOSIS — J849 Interstitial pulmonary disease, unspecified: Secondary | ICD-10-CM

## 2018-01-19 DIAGNOSIS — E079 Disorder of thyroid, unspecified: Secondary | ICD-10-CM

## 2018-01-19 MED ORDER — METOPROLOL SUCCINATE ER 25 MG PO TB24: 12.5000 mg | ORAL_TABLET | Freq: Every day | ORAL | 0 refills | Status: DC

## 2018-01-19 NOTE — Progress Notes (Signed)
Daily Session Note  Patient Details  Name: Tina Calderon MRN: [id number] Date of Birth: 17-Apr-1941 Referring Provider:     Pulmonary Rehab from 12/22/2017 in [facility] Card[facility] and Pulmonary Rehab  Referring Provider  Court Joy MD      Encounter Date: 01/19/2018  Check In: Session Check In - 01/19/18 1030      Check-In   Supervising physician immediately available to respond to emergencies  [facility] immediately available ER MD    Physician(s)  Dr. Quentin Cornwall and Dr. Corky Downs    Location  [facility]-Cardiac & Pulmonary Rehab    Staff Present  Earlean Shawl, BS, ACSM CEP, Exercise Physiologist;Mandi Ballard, BS, PEC;Nada Maclachlan, BA, ACSM CEP, Exercise Physiologist    Medication changes reported      No    Fall or balance concerns reported     No    Tobacco Cessation  No Change    Warm-up and Cool-down  Performed as group-led instruction    Resistance Training Performed  Yes    VAD Patient?  No    PAD/SET Patient?  No      Pain Assessment   Currently in Pain?  No/denies    Multiple Pain Sites  No          Social History   Tobacco Use  Smoking Status Never Smoker  Smokeless Tobacco Never Used    Goals Met:  Proper associated with RPD/PD & O2 Sat Independence with exercise equipment Exercise tolerated well No report of cardiac concerns or symptoms Strength training completed today  Goals Unmet:  Not Applicable  Comments: Pt able to follow exercise prescription today without complaint.  Will continue to monitor for progression.    Dr. Emily Filbert is Medical Director for [place] and [facility].

## 2018-01-19 NOTE — Assessment & Plan Note (Addendum)
Nearly 2 week history of tachycardia, overall unremarkable work up in ED. Repeat ECG today with tachycardia, but some irregularity in AVL, AvF, Lead II.  no t-wave inversion, no obvious ST changes.   Unsure of etiology, likely needs Holter Monitor, cardiology evaluation. Echocardiogram in 2017 with EF of 55-60% with grade 1 diastolic dysfunction. Recent chest xray without pulmonary vascular congestion.  Referral placed for cardiology evaluation. Rx for metoprolol succinate 12.5 mg sent to pharmacy.

## 2018-01-19 NOTE — Progress Notes (Signed)
Subjective:    Patient ID: Tina Calderon, female    DOB: 07-05-1940, 77 y.o.   MRN: [id number]  HPI  Tina Calderon is a 77 year old female who presents today for emergency department follow up.  She presented to [facility] ED on 01/12/18 with a chief complaint of palpitations and shortness of breath. She endorsed a three day history of symptoms with fatigue and rapid heart rate. She presented to physical therapy that morning and was found to have a HR of 120.   During her stay in the ED she underwent ECG with sinus tachycardia with rate of 104, normal axis and intervals. Normal ST segments and T-waves. Two Q waves. Xray showed chronic bronchitic changes, no pneumonia, no vascular congestion. CT angio of the chest was negative for PE. Labs with hyponatremia, otherwise unremarkable, including Troponin. Given grossly unremarkable work up, she was discharged home.  Since her ED visit she continues to experience shortness of breath and palpitations. She has no energy, has to rest after doing mild household work such as cooking, Medical sales representative, making the bed. She is still active in pulmonary rehab three days weekly. She denies chest pain, cough, fevers. She's checking her HR at home and is getting readings of 117 at rest, 124 sometimes. Increases to 130's when up and about.    BP Readings from Last 3 Encounters:  01/19/18 120/70  01/12/18 (!) 158/90  12/18/17 110/60     Review of Systems  Constitutional: Positive for fatigue.  HENT: Negative for congestion.   Respiratory: Positive for shortness of breath. Negative for cough.   Cardiovascular: Positive for palpitations. Negative for chest pain.       Past Medical History:  Diagnosis Date  . Anginal pain ([place])   . Cancer ([place])   . CHF (congestive heart failure) ([place]) 01/22/2012  . Chronic fatigue 07/15/2014  . GERD (gastroesophageal reflux disease) 05/12/2017  . Hypertension   . Hypothyroidism   . ILD (interstitial lung disease) ([place]) 02/04/2012  .  IPF (idiopathic pulmonary fibrosis) ([place]) 2013  . Mild intermittent asthma 01/29/2016  . Pelvic relaxation   . Prolapse of bladder   . Shortness of breath   . Thyroid disease      Social History   Socioeconomic History  . Marital status: Married    Spouse name: Not on file  . Number of children: Not on file  . Years of education: Not on file  . Highest education level: Not on file  Occupational History  . Not on file  Social Needs  . Financial resource strain: Not on file  . Food insecurity:    Worry: Not on file    Inability: Not on file  . Transportation needs:    Medical: Not on file    Non-medical: Not on file  Tobacco Use  . Smoking status: Never Smoker  . Smokeless tobacco: Never Used  Substance and Sexual Activity  . Alcohol use: No  . Drug use: No  . Sexual activity: Never    Birth control/protection: Post-menopausal, Surgical    Comment: HYST  Lifestyle  . Physical activity:    Days per week: Not on file    Minutes per session: Not on file  . Stress: Not on file  Relationships  . Social connections:    Talks on phone: Not on file    Gets together: Not on file    Attends religious service: Not on file    Active member of club or organization: Not  on file    Attends meetings of clubs or organizations: Not on file    Relationship status: Not on file  . Intimate partner violence:    Fear of current or ex partner: Not on file    Emotionally abused: Not on file    Physically abused: Not on file    Forced sexual activity: Not on file  Other Topics Concern  . Not on file  Social History Narrative   Married.   2 children, 5 grandchildren.   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   Enjoys puzzles, sewing, traveling, reading.     Past Surgical History:  Procedure Laterality Date  . ANTERIOR AND POSTERIOR REPAIR  12/05/2011   Procedure: ANTERIOR (CYSTOCELE) AND POSTERIOR REPAIR (RECTOCELE);  Surgeon: [name], MD;  Location: [facility];  Service:  Gynecology;  Laterality: N/A;  with cysto  . Bladder tact  15 years ago  . LEFT HEART CATHETERIZATION WITH CORONARY ANGIOGRAM N/A 01/22/2012   Procedure: LEFT HEART CATHETERIZATION WITH CORONARY ANGIOGRAM;  Surgeon: [name], MD;  Location: [facility] CATH LAB;  Service: Cardiovascular;  Laterality: N/A;  . NECK SURGERY  2010  . SHOULDER SURGERY    . thyroid disease    . TONSILLECTOMY    . VAGINAL HYSTERECTOMY  15 years ago    Family History  Problem Relation Age of Onset  . Thyroid disease Mother   . Hypertension Mother   . Coronary artery disease Mother   . Dementia Mother   . Aneurysm Father   . Pulmonary fibrosis Sister   . Stroke Other   . Sleep apnea Other   . Cancer Brother        LIVER  . Pulmonary fibrosis Brother     Allergies  Allergen Reactions  . Dilaudid [Hydromorphone] Nausea And Vomiting    Current Outpatient Medications on File Prior to Visit  Medication Sig Dispense Refill  . aspirin 81 MG tablet Take 81 mg by mouth daily.    . calcium-vitamin D (OSCAL WITH D) 500-200 MG-UNIT per tablet Take 1 tablet by mouth every morning.    . Cranberry 1000 MG CAPS Take 1 capsule by mouth daily.    . ESBRIET 267 MG CAPS TAKE 3 CAPSULES BY MOUTH THREE TIMES DAILY WITH FOOD 270 capsule 3  . hydrochlorothiazide (HYDRODIURIL) 25 MG tablet TAKE ONE TABLET BY MOUTH EVERY DAY FOR BLOOD PRESSURE 90 tablet 3  . levothyroxine (SYNTHROID, LEVOTHROID) 88 MCG tablet TAKE 1 TABLET BY MOUTH EVERY DAY 90 tablet 1  . loratadine (CLARITIN) 10 MG tablet Take 10 mg by mouth every evening. Reported on 10/13/2015    . Multiple Vitamin (MULTIVITAMIN WITH MINERALS) TABS Take 1 tablet by mouth every morning. Centrum silver    . omeprazole (PRILOSEC) 20 MG capsule TAKE 1 CAPSULE (20 MG TOTAL) BY MOUTH DAILY. 90 capsule 1  . rosuvastatin (CRESTOR) 5 MG tablet Take 1 tablet by mouth every evening for cholesterol. 90 tablet 3   No current facility-administered medications on file prior to visit.      BP 120/70   Pulse (!) 110   Temp 98.1 F (36.7 C) (Oral)   Ht _0  (1.575 m)   Wt 129 lb (58.5 kg)   SpO2 94%   BMI 23.59 kg/m    Objective:   Physical Exam  Constitutional: She appears well-nourished.  Neck: Neck supple.  Cardiovascular: Regular rhythm. Tachycardia present.  Respiratory: Effort normal. No respiratory distress. She has no wheezes.  Skin: Skin is  warm and dry.  Psychiatric: She has a normal mood and affect.           Assessment & Plan:

## 2018-01-19 NOTE — Patient Instructions (Signed)
Stop by the front desk and speak with either Rosaria Ferries or Shirlean Mylar regarding your referral to Cardiology.  It was a pleasure to see you today!

## 2018-01-20 ENCOUNTER — Telehealth: Payer: Self-pay | Admitting: Emergency Medicine

## 2018-01-20 ENCOUNTER — Other Ambulatory Visit: Payer: Self-pay | Admitting: Primary Care

## 2018-01-20 DIAGNOSIS — I10 Essential (primary) hypertension: Secondary | ICD-10-CM

## 2018-01-20 LAB — BASIC METABOLIC PANEL
BUN: 21 mg/dL (ref 6–23)
CO2: 28 meq/L (ref 19–32)
Calcium: 9.5 mg/dL (ref 8.4–10.5)
Chloride: 93 mEq/L — ABNORMAL LOW (ref 96–112)
Creatinine, Ser: 1.07 mg/dL (ref 0.40–1.20)
GFR: 52.7 mL/min — AB (ref >60.00)
Glucose, Bld: 144 mg/dL — ABNORMAL HIGH (ref 70–99)
Potassium: 3.6 mEq/L (ref 3.5–5.1)
SODIUM: 129 meq/L — AB (ref 135–145)

## 2018-01-20 LAB — TSH: TSH: 0.7 u[IU]/mL (ref 0.35–4.50)

## 2018-01-20 NOTE — Telephone Encounter (Signed)
Called and spoke with pt regarding RB recommendations Pt verbalized understanding, and had no further concerns Nothing further needed.

## 2018-01-20 NOTE — Telephone Encounter (Signed)
I think it is ok for her to go to pulm rehab, but she needs to do lower-level, easier exercise until after she is cleared by the cardiologist to resume her previous work load.

## 2018-01-20 NOTE — Telephone Encounter (Signed)
Spoke with pt. States that she has been participating in pulmonary rehab. At her last session, her heart rate got up to 124 beats per minute. The rehab department made her go to the ED. Her PCP is referring her to a Cardiologist to get her heart checked. Pt is questioning if she should continue pulmonary rehab.  RB - please advise. Thanks!

## 2018-01-22 ENCOUNTER — Encounter: Payer: Self-pay | Admitting: Cardiology

## 2018-01-22 ENCOUNTER — Ambulatory Visit: Payer: Medicare Other | Admitting: Cardiology

## 2018-01-22 VITALS — BP 140/88 | HR 94 | Ht 62.5 in | Wt 128.4 lb

## 2018-01-22 DIAGNOSIS — R0602 Shortness of breath: Secondary | ICD-10-CM

## 2018-01-22 DIAGNOSIS — R000 Tachycardia, unspecified: Secondary | ICD-10-CM

## 2018-01-22 NOTE — Patient Instructions (Addendum)
Medication Instructions: Your physician recommends that you continue on your current medications as directed.    If you need a refill on your cardiac medications before your next appointment, please call your pharmacy.   Labwork: None  Procedures/Testing: Your physician has recommended that you wear an event monitor. Event monitors are medical devices that record the heart's electrical activity. Doctors most often [place] these monitors to diagnose arrhythmias. Arrhythmias are problems with the speed or rhythm of the heartbeat. The monitor is a small, portable device. You can wear one while you do your normal daily activities. This is usually used to diagnose what is causing palpitations/syncope (passing out). [place], [address], [place], [place] [address]    Follow-Up: Your physician wants you to follow-up in 6-8 weeks with Dr. Harrell Gave.   Special Instructions:    Thank you for choosing Heartcare at [facility]!!

## 2018-01-22 NOTE — Progress Notes (Signed)
Cardiology Office Note:    Date:  01/22/2018   ID:  Tina Calderon, DOB 10-22-1940, MRN [id number]  PCP:  [name], NP  Cardiologist:  Buford Dresser, MD PhD  Referring MD: [name], NP   Chief Complaint  Patient presents with  . Shortness of Breath    History of Present Illness:    Tina Calderon is a 77 y.o. female with a hx of pulmonary fibrosis who is seen as a new consult at the request of Tina Calderon for evaluation and management of tachycardia.  She presented to [facility] ER on 01/12/18 with palpitations and shortness of breath. At pulmonary rehab that morning, her heart rate was noted to be 120 bpm (patient states this was at rest). In the ER, her ECG showed sinus tachycardia at 104 bpm, CT was negative for PE, negative troponin.  Was seen by PCP 8/12 for her symptoms. (Of note, disagree with computer ECG read of atrial flutter, appears to be sinus tachycardia). Recommendation was to start low dose metoprolol.  Patient reports that she had not noticed her fast heartbeat until she tried to restart pulmonary rehab. Now she feels exhausted with basic everyday activities. For instance, she cooks breakfast every morning for herself and her husband. By the time she is done, she has no appetite. Often she has to lie down to rest for a time after. She is supposed to do about 50 minutes of near continuous cardiovascular activity at pulmonary rehab, and she is completely exhausted by the end of this. She endorses good food and fluid intake despite her fatigue. No infectious symptoms or recent illnesses.  No fevers, chills. Clear rhinorhea. No significant cough. No chest pain, PND, or orthopnea. No palpitations with her heart rate, just feels fatigued.  Past Medical History:  Diagnosis Date  . Anginal pain ([place])   . Cancer ([place])   . CHF (congestive heart failure) ([place]) 01/22/2012  . Chronic fatigue 07/15/2014  . GERD (gastroesophageal reflux disease) 05/12/2017  .  Hypertension   . Hypothyroidism   . ILD (interstitial lung disease) ([place]) 02/04/2012  . IPF (idiopathic pulmonary fibrosis) ([place]) 2013  . Mild intermittent asthma 01/29/2016  . Pelvic relaxation   . Prolapse of bladder   . Shortness of breath   . Thyroid disease     Past Surgical History:  Procedure Laterality Date  . ANTERIOR AND POSTERIOR REPAIR  12/05/2011   Procedure: ANTERIOR (CYSTOCELE) AND POSTERIOR REPAIR (RECTOCELE);  Surgeon: [name], MD;  Location: [facility];  Service: Gynecology;  Laterality: N/A;  with cysto  . Bladder tact  15 years ago  . LEFT HEART CATHETERIZATION WITH CORONARY ANGIOGRAM N/A 01/22/2012   Procedure: LEFT HEART CATHETERIZATION WITH CORONARY ANGIOGRAM;  Surgeon: [name], MD;  Location: [facility] CATH LAB;  Service: Cardiovascular;  Laterality: N/A;  . NECK SURGERY  2010  . SHOULDER SURGERY    . thyroid disease    . TONSILLECTOMY    . VAGINAL HYSTERECTOMY  15 years ago    Current Medications: Current Outpatient Medications on File Prior to Visit  Medication Sig  . aspirin 81 MG tablet Take 81 mg by mouth daily.  . calcium-vitamin D (OSCAL WITH D) 500-200 MG-UNIT per tablet Take 1 tablet by mouth every morning.  . Cranberry 1000 MG CAPS Take 1 capsule by mouth daily.  . ESBRIET 267 MG CAPS TAKE 3 CAPSULES BY MOUTH THREE TIMES DAILY WITH FOOD  . hydrochlorothiazide (HYDRODIURIL) 25 MG tablet TAKE ONE  TABLET BY MOUTH EVERY DAY FOR BLOOD PRESSURE  . levothyroxine (SYNTHROID, LEVOTHROID) 88 MCG tablet TAKE 1 TABLET BY MOUTH EVERY DAY  . loratadine (CLARITIN) 10 MG tablet Take 10 mg by mouth every evening. Reported on 10/13/2015  . metoprolol succinate (TOPROL-XL) 25 MG 24 hr tablet Take 0.5 tablets (12.5 mg total) by mouth daily.  . Multiple Vitamin (MULTIVITAMIN WITH MINERALS) TABS Take 1 tablet by mouth every morning. Centrum silver  . omeprazole (PRILOSEC) 20 MG capsule TAKE 1 CAPSULE (20 MG TOTAL) BY MOUTH DAILY.  . rosuvastatin (CRESTOR) 5 MG tablet  Take 1 tablet by mouth every evening for cholesterol.   No current facility-administered medications on file prior to visit.      Allergies:   Dilaudid [hydromorphone]   Social History   Socioeconomic History  . Marital status: Married    Spouse name: Not on file  . Number of children: Not on file  . Years of education: Not on file  . Highest education level: Not on file  Occupational History  . Not on file  Social Needs  . Financial resource strain: Not on file  . Food insecurity:    Worry: Not on file    Inability: Not on file  . Transportation needs:    Medical: Not on file    Non-medical: Not on file  Tobacco Use  . Smoking status: Never Smoker  . Smokeless tobacco: Never Used  Substance and Sexual Activity  . Alcohol use: No  . Drug use: No  . Sexual activity: Never    Birth control/protection: Post-menopausal, Surgical    Comment: HYST  Lifestyle  . Physical activity:    Days per week: Not on file    Minutes per session: Not on file  . Stress: Not on file  Relationships  . Social connections:    Talks on phone: Not on file    Gets together: Not on file    Attends religious service: Not on file    Active member of club or organization: Not on file    Attends meetings of clubs or organizations: Not on file    Relationship status: Not on file  Other Topics Concern  . Not on file  Social History Narrative   Married.   2 children, 5 grandchildren.   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   Enjoys puzzles, sewing, traveling, reading.      Family History: The patient's family history includes Aneurysm in her father; Cancer in her brother; Coronary artery disease in her mother; Dementia in her mother; Hypertension in her mother; Pulmonary fibrosis in her brother and sister; Sleep apnea in her other; Stroke in her other; Thyroid disease in her mother.  ROS:   Please see the history of present illness.  Additional pertinent ROS: Review of Systems  Constitutional:  Positive for malaise/fatigue. Negative for chills and fever.  HENT: Negative for ear pain and hearing loss.   Eyes: Negative for blurred vision and pain.  Respiratory: Positive for shortness of breath. Negative for hemoptysis, sputum production and wheezing.   Cardiovascular: Negative for chest pain, palpitations, orthopnea, claudication, leg swelling and PND.  Gastrointestinal: Negative for abdominal pain and blood in stool.  Genitourinary: Negative for dysuria and hematuria.  Musculoskeletal: Negative for falls and myalgias.  Skin: Negative for rash.  Neurological: Negative for focal weakness and loss of consciousness.  Endo/Heme/Allergies: Does not bruise/bleed easily.     EKGs/Labs/Other Studies Reviewed:    The following studies were reviewed today: ER  visit, prior ECGs and workup.   [place] 5.30.17 - Left ventricle: The cavity size was normal. Wall thickness was   normal. Systolic function was normal. The estimated ejection   fraction was in the range of 55% to 60%. Doppler parameters are   consistent with abnormal left ventricular relaxation (grade 1   diastolic dysfunction). - Atrial septum: Subcostal imaging shows abnormal liver texture. A   patent foramen ovale cannot be excluded. - Pulmonary arteries: PA peak pressure: 35 mm Hg (S). - Pericardium, extracardiac: A trivial pericardial effusion was   identified posterior to the heart. - Impressions: Subcostal images also appear to show abnormal liver   texture. ON parasternal long axis views appears to be an   imhomogenious mass behind the AV groove/LA   Suggest cardiac MRI or CTA for further assess. May also represent   a large hiatal hernia. Repeat imaging   wtih a carbonated beverage may be useful  Impressions:  - Subcostal images also appear to show abnormal liver texture. ON   parasternal long axis views appears to be an imhomogenious mass   behind the AV groove/LA   Suggest cardiac MRI or CTA for further assess.  May also represent   a large hiatal hernia. Repeat imaging   wtih a carbonated beverage may be useful  CT angio chest 01/12/18 IMPRESSION: 1. No acute findings. No pulmonary embolism seen. No evidence of pneumonia or pulmonary edema. 2. 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 3. Large hiatal hernia, stable. 4. Cardiomegaly. 5. Coronary artery calcifications.  EKG:  EKGs from 01/12/18 and 01/19/18 reviewed, both consistent with sinus tachycardia (8/5 ecg with PVC)  Recent Labs: 11/17/2017: ALT 11 01/12/2018: Hemoglobin 11.7; Platelets 274 01/19/2018: BUN 21; Creatinine, Ser 1.07; Potassium 3.6; Sodium 129; TSH 0.70  Recent Lipid Panel    Component Value Date/Time   CHOL 239 (H) 12/15/2017 0902   TRIG 175.0 (H) 12/15/2017 0902   HDL 55.10 12/15/2017 0902   CHOLHDL 4 12/15/2017 0902   VLDL 35.0 12/15/2017 0902   LDLCALC 149 (H) 12/15/2017 0902    Physical Exam:    VS:  BP 140/88   Pulse 94   Ht 5' 2.5" (1.588 m)   Wt 128 lb 6.4 oz (58.2 kg)   SpO2 98%   BMI 23.11 kg/m     Wt Readings from Last 3 Encounters:  01/22/18 128 lb 6.4 oz (58.2 kg)  01/19/18 129 lb (58.5 kg)  01/12/18 128 lb (58.1 kg)     GEN: Well nourished, well developed in no acute distress HEENT: Normal NECK: No JVD; No carotid bruits LYMPHATICS: No lymphadenopathy CARDIAC: regular rhythm, normal S1 and S2, no murmurs, rubs, gallops. Radial and DP pulses 2+ bilaterally. RESPIRATORY:   No crackles or consolidation. No tight wheezing but upper airways with inspiratory rhonchi ABDOMEN: Soft, non-tender, non-distended MUSCULOSKELETAL:  No edema; No deformity  SKIN: Warm and dry NEUROLOGIC:  Alert and oriented x 3 PSYCHIATRIC:  Normal affect   ASSESSMENT:    1. Tachycardia   2. Shortness of breath    PLAN:    1. Tachycardia: has been sinus when noted. HR is regular and in the 90s  currently. Concern would be if she were having intermittent arrhythmia.  -will order event monitor to track her heart rate variability. If she has afib, SVT, or other arrhythmia will need to treat -if only sinus tachycardia is noted, would  not treat it as the primary event. Question would be what is driving the sinus tach when it occurs. If only exertional, would consider deconditioning, but she notes tachycardia at rest as well. Workup was negative for PE, she denies infectious symptoms. She endorses a good 6 minute walk test recently, but if sinus tach persists would make sure she is not transiently becoming hypoxic.No evidence of volume overload. -if only sinus tach, would repeat echo to evaluate for right sided heart pressures and any structural heart changes. -ok for her to continue pulmonary rehab, but she should not overly exert herself, and she should rest if she becomes fatigued.  2. Shortness of breath: chronic, not recently worsened, and reports a good 6 minute walk test recently. In pulmonary rehab  Plan for follow up: 6-8 weeks to determine if her symptoms have changed with conditioning. Will follow up results of the event monitor in the interim.  Medication Adjustments/Labs and Tests Ordered: Current medicines are reviewed at length with the patient today.  Concerns regarding medicines are outlined above.  Orders Placed This Encounter  Procedures  . CARDIAC EVENT MONITOR   No orders of the defined types were placed in this encounter.   Patient Instructions  Medication Instructions: Your physician recommends that you continue on your current medications as directed.    If you need a refill on your cardiac medications before your next appointment, please call your pharmacy.   Labwork: None  Procedures/Testing: Your physician has recommended that you wear an event monitor. Event monitors are medical devices that record the heart's electrical activity. Doctors most often [place]  these monitors to diagnose arrhythmias. Arrhythmias are problems with the speed or rhythm of the heartbeat. The monitor is a small, portable device. You can wear one while you do your normal daily activities. This is usually used to diagnose what is causing palpitations/syncope (passing out). [place], [address], [place], [place] [address]    Follow-Up: Your physician wants you to follow-up in 6-8 weeks with Dr. Harrell Gave.   Special Instructions:    Thank you for choosing Heartcare at [facility]!!       Signed, Buford Dresser, MD PhD 01/22/2018 6:27 PM    [place]

## 2018-01-22 NOTE — Telephone Encounter (Signed)
Please see pt email and advise if any recs  Looks like just an FYI Thanks      Hello [name]. I had apt with dr Tina Calderon at [facility] heart care this PM. She wants me to wear a holster monitor starting tomorrow for 3 weeks and then see her again. I am going back to therapy tomorrow but do a lower level like you suggested and she agreed as well as [name] and when I need to stop just stop.

## 2018-01-23 ENCOUNTER — Ambulatory Visit (INDEPENDENT_AMBULATORY_CARE_PROVIDER_SITE_OTHER): Payer: Medicare Other

## 2018-01-23 DIAGNOSIS — R000 Tachycardia, unspecified: Secondary | ICD-10-CM

## 2018-01-26 DIAGNOSIS — J849 Interstitial pulmonary disease, unspecified: Secondary | ICD-10-CM

## 2018-01-26 NOTE — Progress Notes (Signed)
Daily Session Note  Patient Details  Name: Tina Calderon MRN: [id number] Date of Birth: February 22, 1941 Referring Provider:     Pulmonary Rehab from 12/22/2017 in [facility] Cardiac [facility] Pulmonary Rehab  Referring Provider  Court Joy MD      Encounter Date: 01/26/2018  Check In: Session Check In - 01/26/18 1018      Check-In   Supervising physician immediately available to respond to emergencies  [facility] immediately available ER MD    Physician(s)  Dr. Corky Downs and Dr. Kerman Passey    Location  [facility]-Cardiac & Pulmonary Rehab    Staff Present  Earlean Shawl, BS, ACSM CEP, Exercise Physiologist;[facility] [place], ACSM CEP, Exercise Physiologist    Medication changes reported      No    Fall or balance concerns reported     No    Tobacco Cessation  No Change    Warm-up and Cool-down  Performed as group-led instruction    Resistance Training Performed  Yes    VAD Patient?  No    PAD/SET Patient?  No      Pain Assessment   Currently in Pain?  No/denies    Multiple Pain Sites  No          Social History   Tobacco Use  Smoking Status Never Smoker  Smokeless Tobacco Never Used    Goals Met:  Proper associated with RPD/PD & O2 Sat Independence with exercise equipment Exercise tolerated well Personal goals reviewed No report of cardiac concerns or symptoms Strength training completed today  Goals Unmet:  Not Applicable  Comments: Pt able to follow exercise prescription today without complaint.  Will continue to monitor for progression.    Dr. Emily Filbert is Medical Director for [place] and [facility]monary Rehabilitation.

## 2018-01-28 DIAGNOSIS — J849 Interstitial pulmonary disease, unspecified: Secondary | ICD-10-CM | POA: Diagnosis not present

## 2018-01-28 NOTE — Progress Notes (Signed)
Daily Session Note  Patient Details  Name: Tina Calderon MRN: [id number] Date of Birth: 1940-08-24 Referring Provider:     Pulmonary Rehab from 12/22/2017 in [facility]  Referring Provider  Court Joy MD      Encounter Date: 01/28/2018  Check In: Session Check In - 01/28/18 1028      Check-In   Supervising physician immediately available to respond to emergencies  [facility] immediately available ER MD    Physician(s)  Jimmye Norman and Mariea Clonts    Location  [facility]-Cardiac & Pulmonary Rehab    Staff Present  Alberteen Sam, MA, RCEP, CCRP, Exercise Physiologist;Joseph Foy [place], [place], ACSM CEP, Exercise Physiologist    Medication changes reported      No    Fall or balance concerns reported     No    Warm-up and Cool-down  Performed as group-led instruction    Resistance Training Performed  Yes    VAD Patient?  No    PAD/SET Patient?  No      Pain Assessment   Currently in Pain?  No/denies    Multiple Pain Sites  No          Social History   Tobacco Use  Smoking Status Never Smoker  Smokeless Tobacco Never Used    Goals Met:  Proper associated with RPD/PD & O2 Sat Independence with exercise equipment Exercise tolerated well Strength training completed today  Goals Unmet:  Not Applicable  Comments: Pt able to follow exercise prescription today without complaint.  Will continue to monitor for progression.    Dr. Emily Filbert is Medical Director for [place] and [facility].

## 2018-01-29 ENCOUNTER — Other Ambulatory Visit: Payer: Self-pay | Admitting: *Deleted

## 2018-01-29 ENCOUNTER — Other Ambulatory Visit (INDEPENDENT_AMBULATORY_CARE_PROVIDER_SITE_OTHER): Payer: Medicare Other

## 2018-01-29 DIAGNOSIS — R000 Tachycardia, unspecified: Secondary | ICD-10-CM

## 2018-01-29 DIAGNOSIS — I10 Essential (primary) hypertension: Secondary | ICD-10-CM | POA: Diagnosis not present

## 2018-01-29 LAB — BASIC METABOLIC PANEL
BUN: 12 mg/dL (ref 6–23)
CO2: 29 mEq/L (ref 19–32)
Calcium: 9.4 mg/dL (ref 8.4–10.5)
Chloride: 102 mEq/L (ref 96–112)
Creatinine, Ser: 0.95 mg/dL (ref 0.40–1.20)
GFR: 60.45 mL/min (ref >60.00)
GLUCOSE: 95 mg/dL (ref 70–99)
Potassium: 4.3 mEq/L (ref 3.5–5.1)
Sodium: 138 mEq/L (ref 135–145)

## 2018-01-30 ENCOUNTER — Encounter (HOSPITAL_COMMUNITY): Payer: Self-pay | Admitting: Emergency Medicine

## 2018-01-30 ENCOUNTER — Telehealth: Payer: Self-pay

## 2018-01-30 ENCOUNTER — Emergency Department (HOSPITAL_COMMUNITY): Payer: Medicare Other

## 2018-01-30 ENCOUNTER — Ambulatory Visit (INDEPENDENT_AMBULATORY_CARE_PROVIDER_SITE_OTHER): Payer: Medicare Other | Admitting: Family Medicine

## 2018-01-30 ENCOUNTER — Other Ambulatory Visit: Payer: Self-pay

## 2018-01-30 ENCOUNTER — Encounter: Payer: Self-pay | Admitting: Family Medicine

## 2018-01-30 ENCOUNTER — Observation Stay (HOSPITAL_COMMUNITY)
Admission: EM | Admit: 2018-01-30 | Discharge: 2018-02-03 | Disposition: A | Discharge status: Home / Self Care | Payer: Medicare Other | Attending: Internal Medicine | Admitting: Internal Medicine

## 2018-01-30 DIAGNOSIS — D638 Anemia in other chronic diseases classified elsewhere: Secondary | ICD-10-CM

## 2018-01-30 DIAGNOSIS — E785 Hyperlipidemia, unspecified: Secondary | ICD-10-CM | POA: Diagnosis present

## 2018-01-30 DIAGNOSIS — I110 Hypertensive heart disease with heart failure: Secondary | ICD-10-CM | POA: Insufficient documentation

## 2018-01-30 DIAGNOSIS — I5032 Chronic diastolic (congestive) heart failure: Secondary | ICD-10-CM | POA: Insufficient documentation

## 2018-01-30 DIAGNOSIS — E041 Nontoxic single thyroid nodule: Secondary | ICD-10-CM | POA: Diagnosis not present

## 2018-01-30 DIAGNOSIS — E039 Hypothyroidism, unspecified: Secondary | ICD-10-CM | POA: Diagnosis not present

## 2018-01-30 DIAGNOSIS — R292 Abnormal reflex: Secondary | ICD-10-CM

## 2018-01-30 DIAGNOSIS — Z7982 Long term (current) use of aspirin: Secondary | ICD-10-CM | POA: Insufficient documentation

## 2018-01-30 DIAGNOSIS — R531 Weakness: Principal | ICD-10-CM | POA: Insufficient documentation

## 2018-01-30 DIAGNOSIS — J84112 Idiopathic pulmonary fibrosis: Secondary | ICD-10-CM | POA: Insufficient documentation

## 2018-01-30 DIAGNOSIS — R29898 Other symptoms and signs involving the musculoskeletal system: Secondary | ICD-10-CM | POA: Diagnosis present

## 2018-01-30 DIAGNOSIS — K219 Gastro-esophageal reflux disease without esophagitis: Secondary | ICD-10-CM | POA: Diagnosis not present

## 2018-01-30 DIAGNOSIS — D500 Iron deficiency anemia secondary to blood loss (chronic): Secondary | ICD-10-CM | POA: Insufficient documentation

## 2018-01-30 DIAGNOSIS — Z859 Personal history of malignant neoplasm, unspecified: Secondary | ICD-10-CM | POA: Insufficient documentation

## 2018-01-30 DIAGNOSIS — Z79899 Other long term (current) drug therapy: Secondary | ICD-10-CM | POA: Insufficient documentation

## 2018-01-30 DIAGNOSIS — D62 Acute posthemorrhagic anemia: Secondary | ICD-10-CM

## 2018-01-30 DIAGNOSIS — R269 Unspecified abnormalities of gait and mobility: Secondary | ICD-10-CM | POA: Insufficient documentation

## 2018-01-30 DIAGNOSIS — R000 Tachycardia, unspecified: Secondary | ICD-10-CM | POA: Diagnosis not present

## 2018-01-30 DIAGNOSIS — I10 Essential (primary) hypertension: Secondary | ICD-10-CM | POA: Diagnosis present

## 2018-01-30 LAB — URINALYSIS, ROUTINE W REFLEX MICROSCOPIC
Bilirubin Urine: NEGATIVE
Glucose, UA: NEGATIVE mg/dL
Hgb urine dipstick: NEGATIVE
Ketones, ur: NEGATIVE mg/dL
Leukocytes, UA: NEGATIVE
Nitrite: NEGATIVE
Protein, ur: NEGATIVE mg/dL
Specific Gravity, Urine: 1.005 (ref 1.005–1.030)
pH: 6 (ref 5.0–8.0)

## 2018-01-30 LAB — BASIC METABOLIC PANEL
Anion gap: 8 (ref 5–15)
BUN: 9 mg/dL (ref 8–23)
CO2: 26 mmol/L (ref 22–32)
Calcium: 9.1 mg/dL (ref 8.9–10.3)
Chloride: 104 mmol/L (ref 98–111)
Creatinine, Ser: 0.82 mg/dL (ref 0.44–1.00)
GFR calc Af Amer: >60 mL/min (ref >60)
GFR calc non Af Amer: >60 mL/min (ref >60)
Glucose, Bld: 109 mg/dL — ABNORMAL HIGH (ref 70–99)
Potassium: 4.4 mmol/L (ref 3.5–5.1)
Sodium: 138 mmol/L (ref 135–145)

## 2018-01-30 LAB — CBC
HCT: 35.7 % — ABNORMAL LOW (ref 36.0–46.0)
Hemoglobin: 10.9 g/dL — ABNORMAL LOW (ref 12.0–15.0)
MCH: 24.9 pg — ABNORMAL LOW (ref 26.0–34.0)
MCHC: 30.5 g/dL (ref 30.0–36.0)
MCV: 81.7 fL (ref 78.0–100.0)
Platelets: 257 10*3/uL (ref 150–400)
RBC: 4.37 MIL/uL (ref 3.87–5.11)
RDW: 14.8 % (ref 11.5–15.5)
WBC: 7.5 10*3/uL (ref 4.0–10.5)

## 2018-01-30 LAB — CBG MONITORING, ED: Glucose-Capillary: 85 mg/dL (ref 70–99)

## 2018-01-30 MED ORDER — METOPROLOL SUCCINATE ER 25 MG PO TB24: 25.0000 mg | ORAL_TABLET | Freq: Every day | ORAL | Status: DC

## 2018-01-30 MED ORDER — ACETAMINOPHEN 325 MG PO TABS
650.0000 mg | ORAL_TABLET | Freq: Once | ORAL | Status: AC
  Administered 2018-01-30: 650 mg via ORAL
  Filled 2018-01-30: qty 2

## 2018-01-30 NOTE — ED Notes (Signed)
Purewick applied at 18:20.

## 2018-01-30 NOTE — Patient Instructions (Addendum)
TO ER.

## 2018-01-30 NOTE — ED Provider Notes (Signed)
[place] EMERGENCY DEPARTMENT Provider Note   CSN: [id number] Arrival date & time: 01/30/18  1532     History   Chief Complaint Chief Complaint  Patient presents with  . Weakness    HPI Tina Calderon is a 77 y.o. female.  HPI   77 year old female with weakness.  She reports symptoms persistent for weeks although they will wax and wane. She was at the doctor's office today with her husband when she suddenly became very weak was she was trying to get up and had to sit down.  She denies feeling dizzy or lightheaded.  She says she felt like her legs just would not support her. Describes symptoms in the preceding weeks as feeling exhausted.  She was recently started on a beta-blocker for tachycardia but states that her symptoms preceded this medication change. She has been having a headache for the past week which is unusual for her. She denies acute pain otherwise. No change in vision. No changes in speech. No incontinence.   Past Medical History:  Diagnosis Date  . Anginal pain ([place])   . Cancer ([place])   . CHF (congestive heart failure) ([place]) 01/22/2012  . Chronic fatigue 07/15/2014  . GERD (gastroesophageal reflux disease) 05/12/2017  . Hypertension   . Hypothyroidism   . ILD (interstitial lung disease) ([place]) 02/04/2012  . IPF (idiopathic pulmonary fibrosis) ([place]) 2013  . Mild intermittent asthma 01/29/2016  . Pelvic relaxation   . Prolapse of bladder   . Shortness of breath   . Thyroid disease     Patient Active Problem List   Diagnosis Date Noted  . Tachycardia 01/19/2018  . Prediabetes 12/18/2017  . Preventative health care 12/18/2017  . GERD (gastroesophageal reflux disease) 05/12/2017  . Essential hypertension 09/25/2016  . Cough variant asthma 06/13/2016  . Allergic rhinitis 01/29/2016  . Mild intermittent asthma 01/29/2016  . Cough 01/03/2015  . Research subject 08/03/2014  . Chronic fatigue 07/15/2014  . Research study patient 06/28/2014    . IPF (idiopathic pulmonary fibrosis) ([place]) 02/04/2012  . Orthostatic hypotension 01/24/2012  . Normocytic anemia 01/22/2012  . Hyperlipidemia 01/22/2012  . Anemia 01/20/2012  . Thyroid disease   . Pelvic relaxation 11/11/2011    Past Surgical History:  Procedure Laterality Date  . ANTERIOR AND POSTERIOR REPAIR  12/05/2011   Procedure: ANTERIOR (CYSTOCELE) AND POSTERIOR REPAIR (RECTOCELE);  Surgeon: [name], MD;  Location: [facility];  Service: Gynecology;  Laterality: N/A;  with cysto  . Bladder tact  15 years ago  . LEFT HEART CATHETERIZATION WITH CORONARY ANGIOGRAM N/A 01/22/2012   Procedure: LEFT HEART CATHETERIZATION WITH CORONARY ANGIOGRAM;  Surgeon: [name], MD;  Location: [facility] CATH LAB;  Service: Cardiovascular;  Laterality: N/A;  . NECK SURGERY  2010  . SHOULDER SURGERY    . thyroid disease    . TONSILLECTOMY    . VAGINAL HYSTERECTOMY  15 years ago     OB History    Gravida  2   Para  2   Term  2   Preterm      AB      Living  2     SAB      TAB      Ectopic      Multiple      Live Births               Home Medications    Prior to Admission medications   Medication Sig Start Date End Date  Taking? Authorizing Provider  aspirin 81 MG tablet Take 81 mg by mouth daily.   Yes [provider]  calcium-vitamin D (OSCAL WITH D) 500-200 MG-UNIT per tablet Take 1 tablet by mouth every morning.   Yes [provider]  Cranberry 1000 MG CAPS Take 1,000 mg by mouth daily.    Yes [provider]  ESBRIET 267 MG CAPS TAKE 3 CAPSULES BY MOUTH THREE TIMES DAILY WITH FOOD Patient taking differently: Take 801 mg by mouth 3 (three) times daily.  07/15/17  Yes Collene Gobble, MD  levothyroxine (SYNTHROID, LEVOTHROID) 88 MCG tablet TAKE 1 TABLET BY MOUTH EVERY DAY Patient taking differently: Take 88 mcg by mouth daily before breakfast.  01/05/18  Yes [name], NP  loratadine (CLARITIN) 10 MG tablet Take 10 mg by mouth  every evening. Reported on 10/13/2015   Yes [provider]  metoprolol succinate (TOPROL-XL) 25 MG 24 hr tablet Take 1 tablet (25 mg total) by mouth daily. Patient taking differently: Take 12.5 mg by mouth daily.  01/30/18  Yes Tonia Ghent, MD  Multiple Vitamin (MULTIVITAMIN WITH MINERALS) TABS Take 1 tablet by mouth every morning. Centrum silver   Yes [provider]  omeprazole (PRILOSEC) 20 MG capsule TAKE 1 CAPSULE (20 MG TOTAL) BY MOUTH DAILY. Patient taking differently: Take 20 mg by mouth daily.  12/08/17  Yes Collene Gobble, MD  rosuvastatin (CRESTOR) 5 MG tablet Take 1 tablet by mouth every evening for cholesterol. Patient taking differently: Take 5 mg by mouth every evening.  12/18/17  Yes [name], NP  hydrochlorothiazide (HYDRODIURIL) 25 MG tablet TAKE ONE TABLET BY MOUTH EVERY DAY FOR BLOOD PRESSURE Patient not taking: Reported on 01/30/2018 02/03/17   [name], NP    Family History Family History  Problem Relation Age of Onset  . Thyroid disease Mother   . Hypertension Mother   . Coronary artery disease Mother   . Dementia Mother   . Aneurysm Father   . Pulmonary fibrosis Sister   . Stroke Other   . Sleep apnea Other   . Cancer Brother        LIVER  . Pulmonary fibrosis Brother     Social History Social History   Tobacco Use  . Smoking status: Never Smoker  . Smokeless tobacco: Never Used  Substance Use Topics  . Alcohol use: No  . Drug use: No     Allergies   Dilaudid [hydromorphone]   Review of Systems Review of Systems  All systems reviewed and negative, other than as noted in HPI.  Physical Exam Updated Vital Signs BP (!) 161/81   Pulse 64   Temp 98.3 F (36.8 C) (Oral)   Resp (!) 23   Ht _0  (1.575 m)   Wt 59.9 kg   SpO2 95%   BMI 24.14 kg/m   Physical Exam  Constitutional: She is oriented to person, place, and time. She appears well-developed and well-nourished. No distress.  HENT:  Head:  Normocephalic and atraumatic.  Eyes: Conjunctivae are normal. Right eye exhibits no discharge. Left eye exhibits no discharge.  Neck: Neck supple.  Cardiovascular: Normal rate, regular rhythm and normal heart sounds. Exam reveals no gallop and no friction rub.  No murmur heard. Pulmonary/Chest: Effort normal and breath sounds normal. No respiratory distress.  Abdominal: Soft. She exhibits no distension. There is no tenderness.  Musculoskeletal: She exhibits no edema or tenderness.  Neurological: She is alert and oriented to person,  place, and time. No cranial nerve deficit. Coordination normal.  Weakness in b/l LE but seems effort related. Pt crying when asked to raise legs. Normal patellar reflexes. Sensation intact to light touch. Strength normal in b/l UE. Normal   Skin: Skin is warm and dry.  Nursing note and vitals reviewed.    ED Treatments / Results  Labs (all labs ordered are listed, but only abnormal results are displayed) Labs Reviewed  BASIC METABOLIC PANEL - Abnormal; Notable for the following components:      Result Value   Glucose, Bld 109 (*)    All other components within normal limits  CBC - Abnormal; Notable for the following components:   Hemoglobin 10.9 (*)    HCT 35.7 (*)    MCH 24.9 (*)    All other components within normal limits  URINALYSIS, ROUTINE W REFLEX MICROSCOPIC - Abnormal; Notable for the following components:   Color, Urine STRAW (*)    All other components within normal limits  CBG MONITORING, ED    EKG EKG Interpretation  Date/Time:  Friday January 30 2018 15:59:53 EDT Ventricular Rate:  88 PR Interval:    QRS Duration: 74 QT Interval:  379 QTC Calculation: 459 R Axis:   76 Text Interpretation:  Sinus rhythm Probable left atrial enlargement Anterior infarct, old Confirmed by Ripley Fraise [phone]) on 01/31/2018 12:50:30 PM   Radiology Ct Head Wo Contrast  Result Date: 01/30/2018 CLINICAL DATA:  Syncopal episode EXAM: CT HEAD  WITHOUT CONTRAST TECHNIQUE: Contiguous axial images were obtained from the base of the skull through the vertex without intravenous contrast. COMPARISON:  None. FINDINGS: Brain: No acute territorial infarction, hemorrhage or intracranial mass. Mild atrophy. Small vessel ischemic changes of the white matter. Stable ventricle size. Vascular: No hyperdense vessels. Scattered calcifications at the carotid siphons. Skull: Normal. Negative for fracture or focal lesion. Sinuses/Orbits: No acute finding. Other: None IMPRESSION: No CT evidence for acute intracranial abnormality. Atrophy with small vessel ischemic changes of the white matter. Electronically Signed   By: Donavan Foil M.D.   On: 01/30/2018 19:04    Procedures Procedures (including critical care time)  Medications Ordered in ED Medications  acetaminophen (TYLENOL) tablet 650 mg (650 mg Oral Given 01/30/18 1730)     Initial Impression / Assessment and Plan / ED Course  I have reviewed the triage vital signs and the nursing notes.  Pertinent labs & imaging results that were available during my care of the patient were reviewed by me and considered in my medical decision making (see chart for details).    77yF with intermittent weakness for weeks. Possibly weak in b/l LE on exam but pt also seems very anxious and suspect that effort related more than anything?  Reflexes ok. Being worked up for tachycardia currently with monitor in place but sinus on monitor with rates in 60-80s with these symptoms while in ED.   Final Clinical Impressions(s) / ED Diagnoses   Final diagnoses:  Weakness  Gait abnormality  Hyperreflexia    ED Discharge Orders    None       Virgel Manifold, MD 02/05/18 1339

## 2018-01-30 NOTE — ED Triage Notes (Signed)
Per EMS pt come from her PCP.  There she was seen for elevated BP.  She recently started Metoprolol and complains of general weakness.  When standing up to leave the office she became very week and the MD helped her sit down.  EMS was called and reported not deficits, LOC, or trauma.  Ptt is AOx4 complains of headache 6/10.  NAD noted at this time.

## 2018-01-30 NOTE — Progress Notes (Signed)
She was not initially on the schedule today.  Her husband was on the schedule but since he was feeling pretty well and the patient was not, he/she/we agreed to switch her onto the schedule.  She has had episodic HA this week, intermittently.    She has had episodic tachycardia.  Previously noted up to 153. Can happen at rest vs exertion, ie either.  She has exhaustion with home activity/chores.  This is all new since 01/12/18.   H/o pulmonary fibrosis at baseline.  She has been in pulmonary rehab.  The recent symptoms that she had noted felt different from previous symptoms attributed to pulmonary fibrosis.  No CP. No syncope.    Has seen cards, on monitor currently. When episodes happen, she has quick onset of sx, with tachy episode <10 minutes.    No caffeine.  No illicits.   PMH and SH reviewed  ROS: Per HPI unless specifically indicated in ROS section   Meds, vitals, and allergies reviewed.   GEN: nad, alert and oriented HEENT: mucous membranes moist NECK: supple w/o LA CV: rrr. PULM: ctab, no inc wob ABD: soft, +bs EXT: no edema SKIN: no acute rash Cardiac monitor in place.

## 2018-01-30 NOTE — Telephone Encounter (Signed)
Pt walked in with her husband; pt stopped at [place] [employer] on way to [facility] and BP was 160/96 P 120. Pt seen ED on 01/12/18 with malaise and tach; saw Gentry Fitz NP on 01/19/18 for HFU and saw card on 01/22/18. Pt to ear a holter monitor for another week.Pt has had H/A all week, Tylenol helps but H/A come back. SOB upon exertion,pt is tired and nervous. No vitals T 98 P 79 BP 160/90 lt arm reg cuff and sitting Pulse ox 95%.  Pt to see Dr Damita Dunnings.

## 2018-01-30 NOTE — Telephone Encounter (Signed)
See OV note.

## 2018-01-30 NOTE — ED Notes (Signed)
Pt to be moved to D34 when available per charge.

## 2018-01-31 ENCOUNTER — Emergency Department (HOSPITAL_COMMUNITY): Payer: Medicare Other

## 2018-01-31 ENCOUNTER — Observation Stay (HOSPITAL_BASED_OUTPATIENT_CLINIC_OR_DEPARTMENT_OTHER): Payer: Medicare Other

## 2018-01-31 DIAGNOSIS — J84112 Idiopathic pulmonary fibrosis: Secondary | ICD-10-CM | POA: Diagnosis not present

## 2018-01-31 DIAGNOSIS — K219 Gastro-esophageal reflux disease without esophagitis: Secondary | ICD-10-CM

## 2018-01-31 DIAGNOSIS — I5030 Unspecified diastolic (congestive) heart failure: Secondary | ICD-10-CM | POA: Diagnosis not present

## 2018-01-31 DIAGNOSIS — R29898 Other symptoms and signs involving the musculoskeletal system: Secondary | ICD-10-CM | POA: Diagnosis present

## 2018-01-31 DIAGNOSIS — R269 Unspecified abnormalities of gait and mobility: Secondary | ICD-10-CM | POA: Diagnosis not present

## 2018-01-31 DIAGNOSIS — E039 Hypothyroidism, unspecified: Secondary | ICD-10-CM | POA: Diagnosis present

## 2018-01-31 DIAGNOSIS — I10 Essential (primary) hypertension: Secondary | ICD-10-CM | POA: Diagnosis not present

## 2018-01-31 DIAGNOSIS — E041 Nontoxic single thyroid nodule: Secondary | ICD-10-CM

## 2018-01-31 LAB — ECHOCARDIOGRAM COMPLETE
HEIGHTINCHES: 62 in
Weight: 2067.03 oz

## 2018-01-31 LAB — FOLATE: FOLATE: 35.3 ng/mL (ref >5.9)

## 2018-01-31 LAB — BASIC METABOLIC PANEL
Anion gap: 8 (ref 5–15)
BUN: 7 mg/dL — AB (ref 8–23)
CALCIUM: 9 mg/dL (ref 8.9–10.3)
CO2: 28 mmol/L (ref 22–32)
Chloride: 101 mmol/L (ref 98–111)
Creatinine, Ser: 0.83 mg/dL (ref 0.44–1.00)
GFR calc Af Amer: >60 mL/min (ref >60)
Glucose, Bld: 102 mg/dL — ABNORMAL HIGH (ref 70–99)
POTASSIUM: 3.8 mmol/L (ref 3.5–5.1)
Sodium: 137 mmol/L (ref 135–145)

## 2018-01-31 LAB — CBC
HEMATOCRIT: 33.4 % — AB (ref 36.0–46.0)
Hemoglobin: 10.6 g/dL — ABNORMAL LOW (ref 12.0–15.0)
MCH: 25.7 pg — ABNORMAL LOW (ref 26.0–34.0)
MCHC: 31.7 g/dL (ref 30.0–36.0)
MCV: 81.1 fL (ref 78.0–100.0)
Platelets: 220 10*3/uL (ref 150–400)
RBC: 4.12 MIL/uL (ref 3.87–5.11)
RDW: 14.8 % (ref 11.5–15.5)
WBC: 7.1 10*3/uL (ref 4.0–10.5)

## 2018-01-31 LAB — BRAIN NATRIURETIC PEPTIDE: B Natriuretic Peptide: 203.2 pg/mL — ABNORMAL HIGH (ref 0.0–100.0)

## 2018-01-31 LAB — RPR: RPR Ser Ql: NONREACTIVE

## 2018-01-31 LAB — MAGNESIUM: MAGNESIUM: 2 mg/dL (ref 1.7–2.4)

## 2018-01-31 LAB — VITAMIN B12: Vitamin B-12: 477 pg/mL (ref 180–914)

## 2018-01-31 LAB — CK: CK TOTAL: 63 U/L (ref 38–234)

## 2018-01-31 MED ORDER — PIRFENIDONE 267 MG PO CAPS
801.0000 mg | ORAL_CAPSULE | Freq: Three times a day (TID) | ORAL | Status: DC
  Administered 2018-01-31 – 2018-02-03 (×11): 801 mg via ORAL
  Filled 2018-01-31 (×13): qty 3

## 2018-01-31 MED ORDER — ONDANSETRON HCL 4 MG/2ML IJ SOLN: 4.0000 mg | Freq: Four times a day (QID) | INTRAMUSCULAR | Status: DC | PRN

## 2018-01-31 MED ORDER — HYDRALAZINE HCL 20 MG/ML IJ SOLN: 5.0000 mg | INTRAMUSCULAR | Status: DC | PRN

## 2018-01-31 MED ORDER — METOPROLOL SUCCINATE 12.5 MG HALF TABLET
12.5000 mg | ORAL_TABLET | Freq: Every day | ORAL | Status: DC
  Administered 2018-01-31 – 2018-02-03 (×4): 12.5 mg via ORAL
  Filled 2018-01-31 (×4): qty 1

## 2018-01-31 MED ORDER — CRANBERRY 1000 MG PO CAPS: 1000.0000 mg | ORAL_CAPSULE | Freq: Every day | ORAL | Status: DC

## 2018-01-31 MED ORDER — SENNOSIDES-DOCUSATE SODIUM 8.6-50 MG PO TABS: 1.0000 | ORAL_TABLET | Freq: Every evening | ORAL | Status: DC | PRN

## 2018-01-31 MED ORDER — CALCIUM CARBONATE-VITAMIN D 500-200 MG-UNIT PO TABS
1.0000 | ORAL_TABLET | Freq: Every morning | ORAL | Status: DC
  Administered 2018-01-31 – 2018-02-03 (×4): 1 via ORAL
  Filled 2018-01-31 (×4): qty 1

## 2018-01-31 MED ORDER — ROSUVASTATIN CALCIUM 5 MG PO TABS
5.0000 mg | ORAL_TABLET | Freq: Every evening | ORAL | Status: DC
  Administered 2018-01-31 – 2018-02-01 (×2): 5 mg via ORAL
  Filled 2018-01-31 (×2): qty 1

## 2018-01-31 MED ORDER — LORATADINE 10 MG PO TABS
10.0000 mg | ORAL_TABLET | Freq: Every evening | ORAL | Status: DC
  Administered 2018-01-31 – 2018-02-03 (×4): 10 mg via ORAL
  Filled 2018-01-31 (×4): qty 1

## 2018-01-31 MED ORDER — FENTANYL CITRATE (PF) 100 MCG/2ML IJ SOLN
50.0000 ug | Freq: Once | INTRAMUSCULAR | Status: AC
  Administered 2018-01-31: 50 ug via INTRAVENOUS
  Filled 2018-01-31: qty 2

## 2018-01-31 MED ORDER — ENOXAPARIN SODIUM 40 MG/0.4ML ~~LOC~~ SOLN
40.0000 mg | SUBCUTANEOUS | Status: DC
  Administered 2018-01-31 – 2018-02-03 (×4): 40 mg via SUBCUTANEOUS
  Filled 2018-01-31 (×4): qty 0.4

## 2018-01-31 MED ORDER — DM-GUAIFENESIN ER 30-600 MG PO TB12
1.0000 | ORAL_TABLET | Freq: Two times a day (BID) | ORAL | Status: DC | PRN
  Filled 2018-01-31: qty 1

## 2018-01-31 MED ORDER — ALBUTEROL SULFATE (2.5 MG/3ML) 0.083% IN NEBU: 2.5000 mg | INHALATION_SOLUTION | RESPIRATORY_TRACT | Status: DC | PRN

## 2018-01-31 MED ORDER — LEVOTHYROXINE SODIUM 88 MCG PO TABS
88.0000 ug | ORAL_TABLET | Freq: Every day | ORAL | Status: DC
  Administered 2018-01-31 – 2018-02-03 (×4): 88 ug via ORAL
  Filled 2018-01-31 (×4): qty 1

## 2018-01-31 MED ORDER — TIZANIDINE HCL 4 MG PO TABS
2.0000 mg | ORAL_TABLET | Freq: Three times a day (TID) | ORAL | Status: DC
  Administered 2018-01-31 – 2018-02-01 (×4): 2 mg via ORAL
  Filled 2018-01-31 (×4): qty 1

## 2018-01-31 MED ORDER — ZOLPIDEM TARTRATE 5 MG PO TABS: 5.0000 mg | ORAL_TABLET | Freq: Every evening | ORAL | Status: DC | PRN

## 2018-01-31 MED ORDER — PANTOPRAZOLE SODIUM 40 MG PO TBEC
40.0000 mg | DELAYED_RELEASE_TABLET | Freq: Every day | ORAL | Status: DC
  Administered 2018-01-31 – 2018-02-03 (×4): 40 mg via ORAL
  Filled 2018-01-31 (×4): qty 1

## 2018-01-31 MED ORDER — ACETAMINOPHEN 325 MG PO TABS
650.0000 mg | ORAL_TABLET | Freq: Four times a day (QID) | ORAL | Status: DC | PRN
  Administered 2018-01-31 – 2018-02-01 (×3): 650 mg via ORAL
  Filled 2018-01-31 (×3): qty 2

## 2018-01-31 MED ORDER — ONDANSETRON HCL 4 MG PO TABS: 4.0000 mg | ORAL_TABLET | Freq: Four times a day (QID) | ORAL | Status: DC | PRN

## 2018-01-31 MED ORDER — ACETAMINOPHEN 650 MG RE SUPP: 650.0000 mg | Freq: Four times a day (QID) | RECTAL | Status: DC | PRN

## 2018-01-31 MED ORDER — ASPIRIN 81 MG PO CHEW
81.0000 mg | CHEWABLE_TABLET | Freq: Every day | ORAL | Status: DC
  Administered 2018-01-31 – 2018-02-03 (×4): 81 mg via ORAL
  Filled 2018-01-31 (×4): qty 1

## 2018-01-31 NOTE — Progress Notes (Signed)
Discussed with Dr. Reeves Forth in sign out.   I have reviewed the chart and images from her MRI scans.   A trial of Zanaflex has been ordered for her lower extremity hypertonicity.   PT is recommended to assess the therapeutic effect of Zanaflex.  Electronically signed: Dr. Kerney Elbe

## 2018-01-31 NOTE — Evaluation (Signed)
Physical Therapy Evaluation Patient Details Name: Tina Calderon MRN: [id number] DOB: 02-May-1941 Today's Date: 01/31/2018   History of Present Illness  Tina Calderon is a 77 y.o. female with medical history significant of hypertension, hyperlipidemia, asthma, GERD, dCHF,  ILD, IPF, hypothyroidism, who presents with bilateral leg weakness, intermittent hypertonicity, and shaking of legs (R>L). 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 Pt currently with functional limitations due to the deficits listed below (see PT Problem List). PTA, pt living with husband independent with all mobility, no assistive device. Today pt presents with hypertonicity in BLE, weakness, anxiety. Tremors go away with distraction able to flex knees smoothly while counting backwards, then later during session without distraction. Min A to lateral scoot to bed, unable to safely stand with max A x1. Gave pt and family therex to do until we can work with patient again. Pt will benefit from skilled PT to increase their independence and safety with mobility to allow discharge to the venue listed below.       Follow Up Recommendations CIR    Equipment Recommendations  (TBD)    Recommendations for Other Services Rehab consult     Precautions / Restrictions Precautions Precautions: Fall Restrictions Weight Bearing Restrictions: No      Mobility  Bed Mobility Overal bed mobility: Needs Assistance Bed Mobility: Rolling;Sidelying to Sit Rolling: Min assist Sidelying to sit: Mod assist          Transfers Overall transfer level: Needs assistance Equipment used: None;1 person hand held assist Transfers: Lateral/Scoot Transfers          Lateral/Scoot Transfers: Min assist General transfer comment: pt cannot stand with max A from chair, drop arm lateral scoot with min A back onto bed.   Ambulation/Gait              General Gait Details: unable at this time  Stairs            Wheelchair Mobility    Modified Rankin (Stroke Patients Only)       Balance Overall balance assessment: Needs assistance Sitting-balance support: No upper extremity supported;Feet supported Sitting balance-Leahy Scale: Fair Sitting balance - Comments: Pt tentative with moving outside her base of support                                     Pertinent Vitals/Pain Pain Assessment: No/denies pain    Home Living Family/patient expects to be discharged to:: Inpatient rehab Living Arrangements: Spouse/significant other Available Help at Discharge: Family;Available 24 hours/day Type of Home: Apartment Home Access: Level entry     Home Layout: One level Home Equipment: None Additional Comments: husband can't A pt he is frail himself    Prior Function Level of Independence: Independent               Hand Dominance   Dominant Hand: Right    Extremity/Trunk Assessment   Upper Extremity Assessment Upper Extremity Assessment: Defer to OT evaluation    Lower Extremity Assessment Lower Extremity Assessment: (gross strength BLE 4-/5.  WNL light touch)       Communication   Communication: No difficulties  Cognition Arousal/Alertness: Awake/alert Behavior During Therapy: Anxious Overall Cognitive Status: Within Functional Limits for tasks assessed  General Comments      Exercises General Exercises - Lower Extremity Ankle Circles/Pumps: 20 reps Quad Sets: 10 reps Long Arc Quad: 10 reps Heel Slides: 10 reps Hip ABduction/ADduction: 10 reps   Assessment/Plan    PT Assessment Patient needs continued PT services  PT Problem List Decreased strength;Decreased range of motion;Decreased activity tolerance;Decreased balance;Impaired tone       PT Treatment Interventions DME instruction;Gait training;Stair  training;Functional mobility training;Therapeutic activities;Therapeutic exercise;Balance training    PT Goals (Current goals can be found in the Care Plan section)  Acute Rehab PT Goals Patient Stated Goal: to figure out what is wrong PT Goal Formulation: With patient Time For Goal Achievement: 02/14/18 Potential to Achieve Goals: Fair    Frequency Min 3X/week   Barriers to discharge        Co-evaluation               AM-PAC PT "6 Clicks" Daily Activity  Outcome Measure Difficulty turning over in bed (including adjusting bedclothes, sheets and blankets)?: Unable Difficulty moving from lying on back to sitting on the side of the bed? : Unable Difficulty sitting down on and standing up from a chair with arms (e.g., wheelchair, bedside commode, etc,.)?: Unable Help needed moving to and from a bed to chair (including a wheelchair)?: Total Help needed walking in hospital room?: Total Help needed climbing 3-5 steps with a railing? : Total 6 Click Score: 6    End of Session Equipment Utilized During Treatment: Gait belt Activity Tolerance: Patient tolerated treatment well Patient left: in bed;with call bell/phone within reach;with family/visitor present Nurse Communication: Mobility status PT Visit Diagnosis: Unsteadiness on feet (R26.81);Muscle weakness (generalized) (M62.81);Difficulty in walking, not elsewhere classified (R26.2)    Time: 1600-1630 PT Time Calculation (min) (ACUTE ONLY): 30 min   Charges:   PT Evaluation $PT Eval Moderate Complexity: 1 Mod PT Treatments $Therapeutic Activity: 8-22 mins        Reinaldo Berber, PT, DPT Acute Rehab Services Pager: [phone]    Reinaldo Berber 01/31/2018, 5:18 PM

## 2018-01-31 NOTE — Evaluation (Addendum)
Occupational Therapy Evaluation Patient Details Name: Tina Calderon MRN: [id number] DOB: Dec 09, 1940 Today's Date: 01/31/2018    History of Present Illness Tina Calderon is a 77 y.o. female with medical history significant of hypertension, hyperlipidemia, asthma, GERD, dCHF,  ILD, IPF, hypothyroidism, who presents with bilateral leg weakness, intermittent hypertonicity, and shaking of legs (R>L). 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 She will benefit from acute OT with follow up OT on CIR.     Follow Up Recommendations  CIR;Supervision/Assistance - 24 hour    Equipment Recommendations  Other (comment)(TBD)       Precautions / Restrictions Precautions Precautions: Fall Restrictions Weight Bearing Restrictions: No      Mobility Bed Mobility Overal bed mobility: Needs Assistance Bed Mobility: Rolling;Sidelying to Sit Rolling: Min assist Sidelying to sit: Mod assist          Transfers Overall transfer level: Needs assistance   Transfers: Lateral/Scoot Transfers          Lateral/Scoot Transfers: Min assist;From elevated surface General transfer comment: unable to stand from elevated pt with +1 A    Balance Overall balance assessment: Needs assistance Sitting-balance support: No upper extremity supported;Feet supported Sitting balance-Leahy Scale: Fair Sitting balance - Comments: Pt tentative with moving outside her base of support                                   ADL either performed or assessed with clinical judgement   ADL Overall ADL's : Needs assistance/impaired Eating/Feeding:  Independent Eating/Feeding Details (indicate cue type and reason): supported sitting Grooming: Set up Grooming Details (indicate cue type and reason): supported sitting Upper Body Bathing: Set up Upper Body Bathing Details (indicate cue type and reason): supported sitting Lower Body Bathing: Moderate assistance;Sitting/lateral leans   Upper Body Dressing : Set up Upper Body Dressing Details (indicate cue type and reason): supported sitting Lower Body Dressing: Total assistance;Sitting/lateral leans   Toilet Transfer: Minimal assistance Toilet Transfer Details (indicate cue type and reason): lateral scoot to drop arm recliner Toileting- Clothing Manipulation and Hygiene: Moderate assistance;Sitting/lateral lean               Vision Patient Visual Report: No change from baseline              Pertinent Vitals/Pain Pain Assessment: No/denies pain     Hand Dominance Right   Extremity/Trunk Assessment Upper Extremity Assessment Upper Extremity Assessment: Overall WFL for tasks assessed   Lower Extremity Assessment Lower Extremity Assessment: (unprovoked shaking of Bil LEs (R>L))       Communication Communication Communication: No difficulties   Cognition Arousal/Alertness: Awake/alert Behavior During Therapy: Anxious Overall Cognitive Status: Within Functional Limits for tasks assessed                                                Home Living Family/patient expects to be discharged to:: Inpatient rehab Living Arrangements: Spouse/significant other Available Help at Discharge: Family;Available 24 hours/day  Type of Home: Apartment Home Access: Level entry     Home Layout: One level         Bathroom Toilet: Standard Bathroom Accessibility: No   Home Equipment: None   Additional Comments: husband can't A pt he is frail himself      Prior Functioning/Environment Level of Independence: Independent                 OT Problem  List: Decreased strength;Decreased range of motion;Impaired balance (sitting and/or standing)      OT Treatment/Interventions: Self-care/ADL training;Balance training;Therapeutic activities;DME and/or AE instruction;Patient/family education    OT Goals(Current goals can be found in the care plan section) Acute Rehab OT Goals Patient Stated Goal: to figure out what is wrong OT Goal Formulation: With patient Time For Goal Achievement: 02/14/18 Potential to Achieve Goals: Good ADL Goals Pt Will Perform Lower Body Bathing: with set-up;with supervision;with adaptive equipment Pt Will Perform Lower Body Dressing: with adaptive equipment;with supervision;with set-up Pt Will Transfer to Toilet: with min assist;stand pivot transfer;bedside commode Additional ADL Goal #1: Pt will be S in and OOB for basic ADLs  OT Frequency: Min 3X/week              AM-PAC PT "6 Clicks" Daily Activity     Outcome Measure Help from another person eating meals?: None Help from another person taking care of personal grooming?: A Little Help from another person toileting, which includes using toliet, bedpan, or urinal?: A Lot Help from another person bathing (including washing, rinsing, drying)?: A Lot Help from another person to put on and taking off regular upper body clothing?: A Little Help from another person to put on and taking off regular lower body clothing?: Total 6 Click Score: 15   End of Session Equipment Utilized During Treatment: Gait belt  Activity Tolerance: Patient tolerated treatment well Patient left: in chair;with call bell/phone within reach;with chair alarm set  OT Visit Diagnosis: Muscle weakness (generalized) (M62.81)                Time: 5003-7048 OT Time Calculation (min): 30 min Charges:  OT General Charges $OT Visit: 1 Visit OT Evaluation $OT Eval Moderate Complexity: 1 Mod OT Treatments $Self Care/Home Management : 8-22 mins [place],  OTR/L [phone] 01/31/2018

## 2018-01-31 NOTE — Progress Notes (Signed)
Pt home med Pirfenidone brought in from home by daughter, counted in front of pt, daughter,  and husband in room during count.  Will take home med down to pharmacy for them to dispense for pt use during hospitalization.

## 2018-01-31 NOTE — ED Notes (Signed)
Patient's daughter Santiago Glad, stated she needed to take her step dad home and wanted to leave contact number [phone]

## 2018-01-31 NOTE — Progress Notes (Signed)
Patient arrived to floor approximately 0605, via stretcher, alert and oriented X 4. She is here for bilateral weakness of legs and she also has high blood pressure.

## 2018-01-31 NOTE — Progress Notes (Signed)
PROGRESS NOTE    Tina Calderon  VWU:[id number] DOB: 09-13-1940 DOA: 01/30/2018 PCP: [name], NP  Brief Narrative: Tina Calderon is a 77 y.o. female with medical history significant of pulmonary Fibrosis, hyperlipidemia, asthma, GERD, dCHF,  ILD, IPF, hypothyroidism, who presents with bilateral leg weakness and falls.   Assessment & Plan:     Bilateral leg weakness/hyperreflexia and increased tone -Associated with intermittent falls and weakness -Neurology following, MRI thoracic and cervical spine completed, questionable mild T-spine spinal cord dilatation, cannot rule out syrinx -Neuro started low dose tizanidine -Physical therapy consult -B-12 level is unremarkable -RPR is pending -will also does hold statin, despite normal CK level  Hyperlipidemia: -Crestor held  IPF (idiopathic pulmonary fibrosis) ([place]): -continue Esbriet -prn albuterol nebs  HTN:  -Continue home medications: Metoprolol -IV hydralazine prn  GERD: -Protonix  Hypothyroidism: Last TSH was 0.7 on 01/19/2018 -Continue home Synthroid  Thyroid nodule: MRI showed an incidental 2.5 cm LEFT thyroid nodule.  Her TSH was normal, 0.7 on 01/19/2018 -Follow-up with PCP  History of palpitations, seeing cardiology -Was wearing an event monitor for the last 2 weeks -Check 2-D echocardiogram, follow-up with Dr. Al Pimple   DVT ppx: SQ Lovenox Code Status: Full code Family Communication: Yes, patient's daughter and husband   at bed side Disposition Plan:  await PT input  Consults : Neurology, Dr. Leonel Ramsay   Procedures:   Antimicrobials:    Subjective: -continues to reports leg weakness  Objective: Vitals:   01/31/18 0515 01/31/18 0606 01/31/18 0734 01/31/18 1250  BP: (!) 157/78 (!) 145/69 (!) 143/82 139/71  Pulse: 69 71 67 66  Resp: (!) _0 Temp:  98.6 F (37 C) 97.9 F (36.6 C) 97.7 F (36.5 C)  TempSrc:  Oral  Oral  SpO2:  90% 96% 94%  Weight:  58.6 kg     Height:  _1  (1.575 m)      Intake/Output Summary (Last 24 hours) at 01/31/2018 1353 Last data filed at 01/30/2018 2143 Gross per 24 hour  Intake -  Output 600 ml  Net -600 ml   Filed Weights   01/30/18 1533 01/30/18 1550 01/31/18 0606  Weight: 59.9 kg 59.9 kg 58.6 kg    Examination:  General exam: Appears calm and comfortable, no distress Respiratory system: Clear to auscultation. Respiratory effort normal. Cardiovascular system: S1 & S2 heard, RRR. No JVD, murmurs, rubs, gallops Gastrointestinal system: Abdomen is nondistended, soft and nontender.Normal bowel sounds heard. Central nervous system: Alert and oriented, cranial nerves intact, upper extremity is 5 out of 5 strength, lower extremity 3+/5 bilaterally, increased tone, very brisk deep tendon reflexes Skin: No rashes, lesions or ulcers Psychiatry: Judgement and insight appear normal. Mood & affect appropriate.     Data Reviewed:   CBC: Recent Labs  Lab 01/30/18 1604 01/31/18 0558  WBC 7.5 7.1  HGB 10.9* 10.6*  HCT 35.7* 33.4*  MCV 81.7 81.1  PLT 257 295   Basic Metabolic Panel: Recent Labs  Lab 01/29/18 0759 01/30/18 1604 01/31/18 0337 01/31/18 0558  NA 138 138  --  137  K 4.3 4.4  --  3.8  CL 102 104  --  101  CO2 29 26  --  28  GLUCOSE 95 109*  --  102*  BUN 12 9  --  7*  CREATININE 0.95 0.82  --  0.83  CALCIUM 9.4 9.1  --  9.0  MG  --   --  2.0  --  GFR: Estimated Creatinine Clearance: 44.2 mL/min (by C-G formula based on SCr of 0.83 mg/dL). Liver Function Tests: No results for input(s): AST, ALT, ALKPHOS, BILITOT, PROT, ALBUMIN in the last 168 hours. No results for input(s): LIPASE, AMYLASE in the last 168 hours. No results for input(s): AMMONIA in the last 168 hours. Coagulation Profile: No results for input(s): INR, PROTIME in the last 168 hours. Cardiac Enzymes: Recent Labs  Lab 01/31/18 0337  CKTOTAL 63   BNP (last 3 results) No results for input(s): PROBNP in the last 8760  hours. HbA1C: No results for input(s): HGBA1C in the last 72 hours. CBG: Recent Labs  Lab 01/30/18 1728  GLUCAP 85   Lipid Profile: No results for input(s): CHOL, HDL, LDLCALC, TRIG, CHOLHDL, LDLDIRECT in the last 72 hours. Thyroid Function Tests: No results for input(s): TSH, T4TOTAL, FREET4, T3FREE, THYROIDAB in the last 72 hours. Anemia Panel: Recent Labs    01/31/18 0558  VITAMINB12 477  FOLATE 35.3   Urine analysis:    Component Value Date/Time   COLORURINE STRAW (A) 01/30/2018 [place] 01/30/2018 1605   APPEARANCEUR Hazy 12/18/2013 1504   LABSPEC 1.005 01/30/2018 1605   LABSPEC 1.015 12/18/2013 1504   PHURINE 6.0 01/30/2018 1605   GLUCOSEU NEGATIVE 01/30/2018 1605   GLUCOSEU Negative 12/18/2013 1504   HGBUR NEGATIVE 01/30/2018 1605   BILIRUBINUR NEGATIVE 01/30/2018 1605   BILIRUBINUR Negative 12/18/2013 1504   KETONESUR NEGATIVE 01/30/2018 1605   PROTEINUR NEGATIVE 01/30/2018 1605   UROBILINOGEN negative 11/13/2011 0907   UROBILINOGEN 0.2 09/19/2009 0518   NITRITE NEGATIVE 01/30/2018 1605   LEUKOCYTESUR NEGATIVE 01/30/2018 1605   LEUKOCYTESUR 2+ 12/18/2013 1504   Sepsis Labs: _0 (procalcitonin:4,lacticidven:4)  )No results found for this or any previous visit (from the past 240 hour(s)).       Radiology Studies: Ct Head Wo Contrast  Result Date: 01/30/2018 CLINICAL DATA:  Syncopal episode EXAM: CT HEAD WITHOUT CONTRAST TECHNIQUE: Contiguous axial images were obtained from the base of the skull through the vertex without intravenous contrast. COMPARISON:  None. FINDINGS: Brain: No acute territorial infarction, hemorrhage or intracranial mass. Mild atrophy. Small vessel ischemic changes of the white matter. Stable ventricle size. Vascular: No hyperdense vessels. Scattered calcifications at the carotid siphons. Skull: Normal. Negative for fracture or focal lesion. Sinuses/Orbits: No acute finding. Other: None IMPRESSION: No CT evidence  for acute intracranial abnormality. Atrophy with small vessel ischemic changes of the white matter. Electronically Signed   By: Donavan Foil M.D.   On: 01/30/2018 19:04   Mr Cervical Spine Wo Contrast  Result Date: 01/31/2018 CLINICAL DATA:  Generalized weakness. History of cervical spine surgery, cancer. EXAM: MRI CERVICAL AND THORACIC SPINE WITHOUT CONTRAST TECHNIQUE: Multiplanar and multiecho pulse sequences of the cervical spine, to include the craniocervical junction and cervicothoracic junction, and thoracic spine, were obtained without intravenous contrast. COMPARISON:  CT HEAD January 30, 2018, MRI cervical spine January 02, 2009. FINDINGS: MRI CERVICAL SPINE FINDINGS ALIGNMENT: Straightened cervical lordosis. Grade 1 C3-4 anterolisthesis. VERTEBRAE/DISCS: Vertebral bodies are intact. Status post C5 through C7 ACDF with bridging bone marrow signal compatible with arthrodesis. Mild bone marrow edema LEFT C3-4 facets. Minimal C3-4 disc height loss with multilevel disc desiccation. CORD:Cervical spinal cord is normal morphology and signal characteristics. POSTERIOR FOSSA, VERTEBRAL ARTERIES, PARASPINAL TISSUES: No MR findings of ligamentous injury. Vertebral artery flow voids present. Patchy pontine T2 hyperintensities compatible with chronic small vessel ischemic changes. DISC LEVELS: C2-3: No disc bulge, canal stenosis nor neural foraminal narrowing. Mild  LEFT facet arthropathy. C3-4: Anterolisthesis. Severe LEFT facet arthropathy with effusion, likely reactive. Uncovertebral hypertrophy and small broad-based disc bulge. Mild canal stenosis. Mild RIGHT, moderate to severe LEFT neural foraminal narrowing. C4-5. Uncovertebral hypertrophy. Severe LEFT facet arthropathy. No canal stenosis. Mild LEFT neural foraminal narrowing. C5-6, C6-7: ACDF.  No canal stenosis or neural foraminal narrowing. C7-T1: No disc bulge, canal stenosis nor neural foraminal narrowing. Moderate facet arthropathy. MRI THORACIC SPINE  FINDINGS ALIGNMENT: Maintenance of the thoracic kyphosis. No malalignment. VERTEBRAE/DISCS: Vertebral bodies are intact. Intervertebral discs morphology and signal are normal, multilevel mild chronic discogenic endplate changes. No abnormal or acute bone marrow signal. CORD: Approximately 3 cm segment of dilated central spinal canal T6 through T7-8, to 2 mm without cord edema. PREVERTEBRAL AND PARASPINAL SOFT TISSUES: Nonacute. 2.5 cm T2 bright LEFT thyroid nodule. DISC LEVELS: No disc bulge, canal stenosis nor neural foraminal narrowing. IMPRESSION: MRI cervical spine: 1. Grade 1 C3-4 anterolisthesis. Severe LEFT C3-4 facet arthropathy and acute reactive changes. 2. No fracture or acute osseous process. C5 through C7 ACDF with arthrodesis. 3. Degenerative change of the cervical spine. Mild canal stenosis C3-4. 4. Moderate to severe LEFT C3-4 neural foraminal narrowing. MRI thoracic spine: 1. No fracture, malalignment or acute osseous process. 2. Mildly dilated midthoracic central spinal canal, borderline findings for syrinx. 3. **An incidental finding of potential clinical significance has been found. 2.5 cm LEFT thyroid nodule. Recommend NONEMERGENT thyroid sonogram. This follows ACR consensus guidelines: Managing Incidental Thyroid Nodules Detected on Imaging: White Paper of the ACR Incidental Thyroid Findings Committee. J Am Coll Radiol 2015; 12:143-150.** Electronically Signed   By: Elon Alas M.D.   On: 01/31/2018 01:29   Mr Thoracic Spine Wo Contrast  Result Date: 01/31/2018 CLINICAL DATA:  Generalized weakness. History of cervical spine surgery, cancer. EXAM: MRI CERVICAL AND THORACIC SPINE WITHOUT CONTRAST TECHNIQUE: Multiplanar and multiecho pulse sequences of the cervical spine, to include the craniocervical junction and cervicothoracic junction, and thoracic spine, were obtained without intravenous contrast. COMPARISON:  CT HEAD January 30, 2018, MRI cervical spine January 02, 2009. FINDINGS: MRI  CERVICAL SPINE FINDINGS ALIGNMENT: Straightened cervical lordosis. Grade 1 C3-4 anterolisthesis. VERTEBRAE/DISCS: Vertebral bodies are intact. Status post C5 through C7 ACDF with bridging bone marrow signal compatible with arthrodesis. Mild bone marrow edema LEFT C3-4 facets. Minimal C3-4 disc height loss with multilevel disc desiccation. CORD:Cervical spinal cord is normal morphology and signal characteristics. POSTERIOR FOSSA, VERTEBRAL ARTERIES, PARASPINAL TISSUES: No MR findings of ligamentous injury. Vertebral artery flow voids present. Patchy pontine T2 hyperintensities compatible with chronic small vessel ischemic changes. DISC LEVELS: C2-3: No disc bulge, canal stenosis nor neural foraminal narrowing. Mild LEFT facet arthropathy. C3-4: Anterolisthesis. Severe LEFT facet arthropathy with effusion, likely reactive. Uncovertebral hypertrophy and small broad-based disc bulge. Mild canal stenosis. Mild RIGHT, moderate to severe LEFT neural foraminal narrowing. C4-5. Uncovertebral hypertrophy. Severe LEFT facet arthropathy. No canal stenosis. Mild LEFT neural foraminal narrowing. C5-6, C6-7: ACDF.  No canal stenosis or neural foraminal narrowing. C7-T1: No disc bulge, canal stenosis nor neural foraminal narrowing. Moderate facet arthropathy. MRI THORACIC SPINE FINDINGS ALIGNMENT: Maintenance of the thoracic kyphosis. No malalignment. VERTEBRAE/DISCS: Vertebral bodies are intact. Intervertebral discs morphology and signal are normal, multilevel mild chronic discogenic endplate changes. No abnormal or acute bone marrow signal. CORD: Approximately 3 cm segment of dilated central spinal canal T6 through T7-8, to 2 mm without cord edema. PREVERTEBRAL AND PARASPINAL SOFT TISSUES: Nonacute. 2.5 cm T2 bright LEFT thyroid nodule. DISC LEVELS: No disc bulge, canal  stenosis nor neural foraminal narrowing. IMPRESSION: MRI cervical spine: 1. Grade 1 C3-4 anterolisthesis. Severe LEFT C3-4 facet arthropathy and acute reactive  changes. 2. No fracture or acute osseous process. C5 through C7 ACDF with arthrodesis. 3. Degenerative change of the cervical spine. Mild canal stenosis C3-4. 4. Moderate to severe LEFT C3-4 neural foraminal narrowing. MRI thoracic spine: 1. No fracture, malalignment or acute osseous process. 2. Mildly dilated midthoracic central spinal canal, borderline findings for syrinx. 3. **An incidental finding of potential clinical significance has been found. 2.5 cm LEFT thyroid nodule. Recommend NONEMERGENT thyroid sonogram. This follows ACR consensus guidelines: Managing Incidental Thyroid Nodules Detected on Imaging: White Paper of the ACR Incidental Thyroid Findings Committee. J Am Coll Radiol 2015; 12:143-150.** Electronically Signed   By: Elon Alas M.D.   On: 01/31/2018 01:29        Scheduled Meds: . aspirin  81 mg Oral Daily  . calcium-vitamin D  1 tablet Oral q morning - 10a  . enoxaparin (LOVENOX) injection  40 mg Subcutaneous Q24H  . levothyroxine  88 mcg Oral QAC breakfast  . loratadine  10 mg Oral QPM  . metoprolol succinate  12.5 mg Oral Daily  . pantoprazole  40 mg Oral Daily  . Pirfenidone  801 mg Oral TID WC  . rosuvastatin  5 mg Oral QPM  . tiZANidine  2 mg Oral TID   Continuous Infusions:   LOS: 0 days    Time spent: 39mn    PDomenic Polite MD [facility] Page via [email or web address], password [email or web address] After 7PM please contact night-coverage  01/31/2018, 1:53 PM

## 2018-01-31 NOTE — ED Notes (Signed)
Dr. Leonel Ramsay at bedside

## 2018-01-31 NOTE — ED Notes (Addendum)
Attempted to ambulate pt. Pt unable to lift legs in standing position. Pt very slow moving to sit up on side of bed. Pt needed two person assist to stand and was unable to take steps. Pt denied pain but reported that her legs felt heavy. MD aware.

## 2018-01-31 NOTE — ED Notes (Signed)
Patient transported to MRI 

## 2018-01-31 NOTE — Consult Note (Signed)
Neurology Consultation Reason for Consult: Difficulty walking Referring Physician: Rancour, S  CC: Difficulty walking  History is obtained from: Patient  HPI: Tina Calderon is a 77 y.o. female presents with difficulty walking that started relatively abruptly at the doctor's office today.  Her husband had a doctor's appointment, however before they left the house she took her blood pressure and found to be quite elevated in the 180s.  They stopped by the fire station to make sure that she had checked it correctly and again found it to be elevated and then proceeded to the doctor's office.  There, they changed her appointment from her husband to her so that she had address her blood pressure.  While then walking across the doctor's office, she states that her legs with no longer support her and she went down.  Since that time, she has been having difficulty putting weight on her legs, and has had several cramps.  She states that she has had a similar episode for 5 times in the past, usually lasting a few hours but then resolving.   ROS: A 14 point ROS was performed and is negative except as noted in the HPI.   Past Medical History:  Diagnosis Date  . Anginal pain ([place])   . Cancer ([place])   . CHF (congestive heart failure) ([place]) 01/22/2012  . Chronic fatigue 07/15/2014  . GERD (gastroesophageal reflux disease) 05/12/2017  . Hypertension   . Hypothyroidism   . ILD (interstitial lung disease) ([place]) 02/04/2012  . IPF (idiopathic pulmonary fibrosis) ([place]) 2013  . Mild intermittent asthma 01/29/2016  . Pelvic relaxation   . Prolapse of bladder   . Shortness of breath   . Thyroid disease      Family History  Problem Relation Age of Onset  . Thyroid disease Mother   . Hypertension Mother   . Coronary artery disease Mother   . Dementia Mother   . Aneurysm Father   . Pulmonary fibrosis Sister   . Stroke Other   . Sleep apnea Other   . Cancer Brother        LIVER  . Pulmonary fibrosis  Brother      Social History:  reports that she has never smoked. She has never used smokeless tobacco. She reports that she does not drink alcohol or use drugs.   Exam: Current vital signs: BP (!) 172/93   Pulse 77   Temp 98.3 F (36.8 C) (Oral)   Resp 19   Ht _0  (1.575 m)   Wt 59.9 kg   SpO2 93%   BMI 24.14 kg/m  Vital signs in last 24 hours: Temp:  [98 F (36.7 C)-98.3 F (36.8 C)] 98.3 F (36.8 C) (08/23 1532) Pulse Rate:  [63-79] 77 (08/24 0230) Resp:  [12-25] 19 (08/24 0230) BP: (152-172)/(70-94) 172/93 (08/24 0230) SpO2:  [91 %-99 %] 93 % (08/24 0230) Weight:  [59.9 kg-60.1 kg] 59.9 kg (08/23 1550)   Physical Exam  Constitutional: Appears well-developed and well-nourished.  Psych: Affect appropriate to situation Eyes: No scleral injection HENT: No OP obstrucion Head: Normocephalic.  Cardiovascular: Normal rate and regular rhythm.  Respiratory: Effort normal, non-labored breathing GI: Soft.  No distension. There is no tenderness.  Skin: WDI  Neuro: Mental Status: Patient is awake, alert, oriented to person, place, month, year, and situation. Patient is able to give a clear and coherent history. No signs of aphasia or neglect Cranial Nerves: II: Visual Fields are full. Pupils are equal, round, and reactive  to light.   III,IV, VI: EOMI without ptosis or diploplia.  V: Facial sensation is symmetric to temperature VII: Facial movement is symmetric.  VIII: hearing is intact to voice X: Uvula elevates symmetrically XI: Shoulder shrug is symmetric. XII: tongue is midline without atrophy or fasciculations.  Motor: Tone is normal. Bulk is normal. 5/5 strength was present in all four extremities.  When I asked her to lift her leg out of bed, she initially went to it but if I lift her leg for her, she is able to hold with 5 out of 5 strength before than letting it drift back to the bed. Sensory: Sensation is symmetric to light touch and temperature in the arms  and legs. Deep Tendon Reflexes: 2+ and symmetric in the biceps and 3+ at the patellae 2+ at the ankles. Plantars: Toes are downgoing on the right, equivocal on the left Cerebellar: FNF intact bilaterally.   I have reviewed labs in epic and the results pertinent to this consultation are: BMP-unremarkable  I have reviewed the images obtained: MRI C and T-spine- mild dilatation of the central canal suspicious for syrinx.  Impression: 77 year old female with difficulty walking, hyperreflexia and cramps.  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  I think that she may have been quite anxious about her blood pressure earlier today, if she is hyperreflexic at baseline, she may get even more so with anxiety, causing cramps and perceived weakness.  No clear lab abnormality explaining her hyperreflexia.  TSH was normal less than 2 weeks ago.  Recommendations: 1) magnesium 2) physical therapy 3) we will follow   Roland Rack, MD [facility] [phone]  If 7pm- 7am, please page neurology on call as listed in [place].

## 2018-01-31 NOTE — Progress Notes (Signed)
  Echocardiogram 2D Echocardiogram has been performed.  Merrie Roof F 01/31/2018, 2:26 PM

## 2018-01-31 NOTE — H&P (Signed)
History and Physical    Tina Calderon Tina Calderon DOB: 07/04/1940 DOA: 01/30/2018  Referring MD/NP/PA:   PCP: [name], NP   Patient coming from:  The patient is coming from home.  At baseline, pt is independent for most of ADL.   Chief Complaint: Bilateral leg weakness  HPI: Tina Calderon is a 77 y.o. female with medical history significant of hypertension, hyperlipidemia, asthma, GERD, dCHF,  ILD, IPF, hypothyroidism, who presents with bilateral leg weakness.  Patient states that she has been having weakness in both legs for almost 3 weeks, which has worsened today. She states that she was in doctor's office today, and developed worsening leg weakness, could not walk anymore.  She had several cramps in legs.  Patient denies vision change, hearing loss, facial droop or slurred speech.  No recent viral infection symptoms.  Patient states that she has history of IPF, with chronic mild shortness of breath and mild cough, which has not changed.  No chest pain, fever or chills.  Patient denies nausea, vomiting, diarrhea, abdominal pain, symptoms of UTI.  ED Course: pt was found to have WBC 7.5, negative troponin, negative urinalysis, CK 63, electrolytes renal function okay, temperature normal, no tachycardia, oxygen saturation 91 to 93% on room air.  CT head is negative for acute intracranial abnormalities.  Patient is placed on MedSurg bed for observation.  Neurology, Dr. Leonel Ramsay was consulted.  MRI of C-spine: 1. Grade 1 C3-4 anterolisthesis. Severe LEFT C3-4 facet arthropathy and acute reactive changes. 2. No fracture or acute osseous process. C5 through C7 ACDF with arthrodesis. 3. Degenerative change of the cervical spine. Mild canal stenosis C3-4. 4. Moderate to severe LEFT C3-4 neural foraminal narrowing.  MRI-T-spine showed: 1. No fracture, malalignment or acute osseous process. 2. Mildly dilated midthoracic central spinal canal, borderline findings for syrinx. 3.  An incidental finding of potential clinical significance has been found. 2.5 cm LEFT thyroid nodule.   Review of Systems:   General: no fevers, chills, no body weight gain, has fatigue HEENT: no blurry vision, hearing changes or sore throat Respiratory: has dyspnea, coughing, no wheezing CV: no chest pain, no palpitations GI: no nausea, vomiting, abdominal pain, diarrhea, constipation GU: no dysuria, burning on urination, increased urinary frequency, hematuria  Ext: no leg edema Neuro: no unilateral weakness, numbness, or tingling, no vision change or hearing loss. Has leg weakness. Skin: no rash, no skin tear. MSK: No muscle spasm, no deformity, no limitation of range of movement in spin Heme: No easy bruising.  Travel history: No recent long distant travel.  Allergy:  Allergies  Allergen Reactions  . Dilaudid [Hydromorphone] Nausea And Vomiting    Past Medical History:  Diagnosis Date  . Anginal pain ([place])   . Cancer ([place])   . CHF (congestive heart failure) ([place]) 01/22/2012  . Chronic fatigue 07/15/2014  . GERD (gastroesophageal reflux disease) 05/12/2017  . Hypertension   . Hypothyroidism   . ILD (interstitial lung disease) ([place]) 02/04/2012  . IPF (idiopathic pulmonary fibrosis) ([place]) 2013  . Mild intermittent asthma 01/29/2016  . Pelvic relaxation   . Prolapse of bladder   . Shortness of breath   . Thyroid disease     Past Surgical History:  Procedure Laterality Date  . ANTERIOR AND POSTERIOR REPAIR  12/05/2011   Procedure: ANTERIOR (CYSTOCELE) AND POSTERIOR REPAIR (RECTOCELE);  Surgeon: [name], MD;  Location: [facility];  Service: Gynecology;  Laterality: N/A;  with cysto  . Bladder tact  15 years  ago  . LEFT HEART CATHETERIZATION WITH CORONARY ANGIOGRAM N/A 01/22/2012   Procedure: LEFT HEART CATHETERIZATION WITH CORONARY ANGIOGRAM;  Surgeon: [name], MD;  Location: [facility] CATH LAB;  Service: Cardiovascular;  Laterality: N/A;  . NECK SURGERY  2010  . SHOULDER  SURGERY    . thyroid disease    . TONSILLECTOMY    . VAGINAL HYSTERECTOMY  15 years ago    Social History:  reports that she has never smoked. She has never used smokeless tobacco. She reports that she does not drink alcohol or use drugs.  Family History:  Family History  Problem Relation Age of Onset  . Thyroid disease Mother   . Hypertension Mother   . Coronary artery disease Mother   . Dementia Mother   . Aneurysm Father   . Pulmonary fibrosis Sister   . Stroke Other   . Sleep apnea Other   . Cancer Brother        LIVER  . Pulmonary fibrosis Brother      Prior to Admission medications   Medication Sig Start Date End Date Taking? Authorizing Provider  aspirin 81 MG tablet Take 81 mg by mouth daily.   Yes [provider]  calcium-vitamin D (OSCAL WITH D) 500-200 MG-UNIT per tablet Take 1 tablet by mouth every morning.   Yes [provider]  Cranberry 1000 MG CAPS Take 1,000 mg by mouth daily.    Yes [provider]  ESBRIET 267 MG CAPS TAKE 3 CAPSULES BY MOUTH THREE TIMES DAILY WITH FOOD Patient taking differently: Take 801 mg by mouth 3 (three) times daily.  07/15/17  Yes Collene Gobble, MD  levothyroxine (SYNTHROID, LEVOTHROID) 88 MCG tablet TAKE 1 TABLET BY MOUTH EVERY DAY Patient taking differently: Take 88 mcg by mouth daily before breakfast.  01/05/18  Yes [name], NP  loratadine (CLARITIN) 10 MG tablet Take 10 mg by mouth every evening. Reported on 10/13/2015   Yes [provider]  metoprolol succinate (TOPROL-XL) 25 MG 24 hr tablet Take 1 tablet (25 mg total) by mouth daily. Patient taking differently: Take 12.5 mg by mouth daily.  01/30/18  Yes Tonia Ghent, MD  Multiple Vitamin (MULTIVITAMIN WITH MINERALS) TABS Take 1 tablet by mouth every morning. Centrum silver   Yes [provider]  omeprazole (PRILOSEC) 20 MG capsule TAKE 1 CAPSULE (20 MG TOTAL) BY MOUTH DAILY. Patient taking differently: Take 20 mg by mouth  daily.  12/08/17  Yes Collene Gobble, MD  rosuvastatin (CRESTOR) 5 MG tablet Take 1 tablet by mouth every evening for cholesterol. Patient taking differently: Take 5 mg by mouth every evening.  12/18/17  Yes [name], NP  hydrochlorothiazide (HYDRODIURIL) 25 MG tablet TAKE ONE TABLET BY MOUTH EVERY DAY FOR BLOOD PRESSURE Patient not taking: Reported on 01/30/2018 02/03/17   [name], NP    Physical Exam: Vitals:   01/31/18 0400 01/31/18 0445 01/31/18 0515 01/31/18 0606  BP: (!) 163/75 (!) 157/74 (!) 157/78 (!) 145/69  Pulse: 74 71 69 71  Resp: 20 19 (!) 22 20  Temp:    98.6 F (37 C)  TempSrc:    Oral  SpO2: 94% 90%  90%  Weight:      Height:       General: Not in acute distress HEENT:       Eyes: PERRL, EOMI, no scleral icterus.       ENT: No discharge from the ears and nose, no pharynx injection,  no tonsillar enlargement.        Neck: No JVD, no bruit, no mass felt. Heme: No neck lymph node enlargement. Cardiac: S1/S2, RRR, No murmurs, No gallops or rubs. Respiratory:  No rales, wheezing, rhonchi or rubs. GI: Soft, nondistended, nontender, no rebound pain, no organomegaly, BS present. GU: No hematuria Ext: No pitting leg edema bilaterally. 2+DP/PT pulse bilaterally. Musculoskeletal: No joint deformities, No joint redness or warmth, no limitation of ROM in spin. Skin: No rashes.  Neuro: Alert, oriented X3, cranial nerves II-XII grossly intact, moves all extremities normally. Muscle strength 2/5 in legs and 5/5 in arms. Sensation to light touch intact. Brachial reflex 2+ bilaterally. Knee hyper-reflex  Psych: Patient is not psychotic, no suicidal or hemocidal ideation.  Labs on Admission: I have personally reviewed following labs and imaging studies  CBC: Recent Labs  Lab 01/30/18 1604  WBC 7.5  HGB 10.9*  HCT 35.7*  MCV 81.7  PLT 681   Basic Metabolic Panel: Recent Labs  Lab 01/29/18 0759 01/30/18 1604 01/31/18 0337  NA 138 138  --   K 4.3 4.4   --   CL 102 104  --   CO2 29 26  --   GLUCOSE 95 109*  --   BUN 12 9  --   CREATININE 0.95 0.82  --   CALCIUM 9.4 9.1  --   MG  --   --  2.0   GFR: Estimated Creatinine Clearance: 44.7 mL/min (by C-G formula based on SCr of 0.82 mg/dL). Liver Function Tests: No results for input(s): AST, ALT, ALKPHOS, BILITOT, PROT, ALBUMIN in the last 168 hours. No results for input(s): LIPASE, AMYLASE in the last 168 hours. No results for input(s): AMMONIA in the last 168 hours. Coagulation Profile: No results for input(s): INR, PROTIME in the last 168 hours. Cardiac Enzymes: Recent Labs  Lab 01/31/18 0337  CKTOTAL 63   BNP (last 3 results) No results for input(s): PROBNP in the last 8760 hours. HbA1C: No results for input(s): HGBA1C in the last 72 hours. CBG: Recent Labs  Lab 01/30/18 1728  GLUCAP 85   Lipid Profile: No results for input(s): CHOL, HDL, LDLCALC, TRIG, CHOLHDL, LDLDIRECT in the last 72 hours. Thyroid Function Tests: No results for input(s): TSH, T4TOTAL, FREET4, T3FREE, THYROIDAB in the last 72 hours. Anemia Panel: No results for input(s): VITAMINB12, FOLATE, FERRITIN, TIBC, IRON, RETICCTPCT in the last 72 hours. Urine analysis:    Component Value Date/Time   COLORURINE STRAW (A) 01/30/2018 1605   APPEARANCEUR CLEAR 01/30/2018 1605   APPEARANCEUR Hazy 12/18/2013 1504   LABSPEC 1.005 01/30/2018 1605   LABSPEC 1.015 12/18/2013 1504   PHURINE 6.0 01/30/2018 1605   GLUCOSEU NEGATIVE 01/30/2018 1605   GLUCOSEU Negative 12/18/2013 1504   HGBUR NEGATIVE 01/30/2018 1605   BILIRUBINUR NEGATIVE 01/30/2018 1605   BILIRUBINUR Negative 12/18/2013 1504   KETONESUR NEGATIVE 01/30/2018 1605   PROTEINUR NEGATIVE 01/30/2018 1605   UROBILINOGEN negative 11/13/2011 0907   UROBILINOGEN 0.2 09/19/2009 0518   NITRITE NEGATIVE 01/30/2018 1605   LEUKOCYTESUR NEGATIVE 01/30/2018 1605   LEUKOCYTESUR 2+ 12/18/2013 1504   Sepsis Labs: _0 (procalcitonin:4,lacticidven:4) )No  results found for this or any previous visit (from the past 240 hour(s)).   Radiological Exams on Admission: Ct Head Wo Contrast  Result Date: 01/30/2018 CLINICAL DATA:  Syncopal episode EXAM: CT HEAD WITHOUT CONTRAST TECHNIQUE: Contiguous axial images were obtained from the base of the skull through the vertex without intravenous contrast. COMPARISON:  None. FINDINGS: Brain: No acute  territorial infarction, hemorrhage or intracranial mass. Mild atrophy. Small vessel ischemic changes of the white matter. Stable ventricle size. Vascular: No hyperdense vessels. Scattered calcifications at the carotid siphons. Skull: Normal. Negative for fracture or focal lesion. Sinuses/Orbits: No acute finding. Other: None IMPRESSION: No CT evidence for acute intracranial abnormality. Atrophy with small vessel ischemic changes of the white matter. Electronically Signed   By: Donavan Foil M.D.   On: 01/30/2018 19:04   Mr Cervical Spine Wo Contrast  Result Date: 01/31/2018 CLINICAL DATA:  Generalized weakness. History of cervical spine surgery, cancer. EXAM: MRI CERVICAL AND THORACIC SPINE WITHOUT CONTRAST TECHNIQUE: Multiplanar and multiecho pulse sequences of the cervical spine, to include the craniocervical junction and cervicothoracic junction, and thoracic spine, were obtained without intravenous contrast. COMPARISON:  CT HEAD January 30, 2018, MRI cervical spine January 02, 2009. FINDINGS: MRI CERVICAL SPINE FINDINGS ALIGNMENT: Straightened cervical lordosis. Grade 1 C3-4 anterolisthesis. VERTEBRAE/DISCS: Vertebral bodies are intact. Status post C5 through C7 ACDF with bridging bone marrow signal compatible with arthrodesis. Mild bone marrow edema LEFT C3-4 facets. Minimal C3-4 disc height loss with multilevel disc desiccation. CORD:Cervical spinal cord is normal morphology and signal characteristics. POSTERIOR FOSSA, VERTEBRAL ARTERIES, PARASPINAL TISSUES: No MR findings of ligamentous injury. Vertebral artery flow voids  present. Patchy pontine T2 hyperintensities compatible with chronic small vessel ischemic changes. DISC LEVELS: C2-3: No disc bulge, canal stenosis nor neural foraminal narrowing. Mild LEFT facet arthropathy. C3-4: Anterolisthesis. Severe LEFT facet arthropathy with effusion, likely reactive. Uncovertebral hypertrophy and small broad-based disc bulge. Mild canal stenosis. Mild RIGHT, moderate to severe LEFT neural foraminal narrowing. C4-5. Uncovertebral hypertrophy. Severe LEFT facet arthropathy. No canal stenosis. Mild LEFT neural foraminal narrowing. C5-6, C6-7: ACDF.  No canal stenosis or neural foraminal narrowing. C7-T1: No disc bulge, canal stenosis nor neural foraminal narrowing. Moderate facet arthropathy. MRI THORACIC SPINE FINDINGS ALIGNMENT: Maintenance of the thoracic kyphosis. No malalignment. VERTEBRAE/DISCS: Vertebral bodies are intact. Intervertebral discs morphology and signal are normal, multilevel mild chronic discogenic endplate changes. No abnormal or acute bone marrow signal. CORD: Approximately 3 cm segment of dilated central spinal canal T6 through T7-8, to 2 mm without cord edema. PREVERTEBRAL AND PARASPINAL SOFT TISSUES: Nonacute. 2.5 cm T2 bright LEFT thyroid nodule. DISC LEVELS: No disc bulge, canal stenosis nor neural foraminal narrowing. IMPRESSION: MRI cervical spine: 1. Grade 1 C3-4 anterolisthesis. Severe LEFT C3-4 facet arthropathy and acute reactive changes. 2. No fracture or acute osseous process. C5 through C7 ACDF with arthrodesis. 3. Degenerative change of the cervical spine. Mild canal stenosis C3-4. 4. Moderate to severe LEFT C3-4 neural foraminal narrowing. MRI thoracic spine: 1. No fracture, malalignment or acute osseous process. 2. Mildly dilated midthoracic central spinal canal, borderline findings for syrinx. 3. **An incidental finding of potential clinical significance has been found. 2.5 cm LEFT thyroid nodule. Recommend NONEMERGENT thyroid sonogram. This follows ACR  consensus guidelines: Managing Incidental Thyroid Nodules Detected on Imaging: White Paper of the ACR Incidental Thyroid Findings Committee. J Am Coll Radiol 2015; 12:143-150.** Electronically Signed   By: Elon Alas M.D.   On: 01/31/2018 01:29   Mr Thoracic Spine Wo Contrast  Result Date: 01/31/2018 CLINICAL DATA:  Generalized weakness. History of cervical spine surgery, cancer. EXAM: MRI CERVICAL AND THORACIC SPINE WITHOUT CONTRAST TECHNIQUE: Multiplanar and multiecho pulse sequences of the cervical spine, to include the craniocervical junction and cervicothoracic junction, and thoracic spine, were obtained without intravenous contrast. COMPARISON:  CT HEAD January 30, 2018, MRI cervical spine January 02, 2009. FINDINGS: MRI  CERVICAL SPINE FINDINGS ALIGNMENT: Straightened cervical lordosis. Grade 1 C3-4 anterolisthesis. VERTEBRAE/DISCS: Vertebral bodies are intact. Status post C5 through C7 ACDF with bridging bone marrow signal compatible with arthrodesis. Mild bone marrow edema LEFT C3-4 facets. Minimal C3-4 disc height loss with multilevel disc desiccation. CORD:Cervical spinal cord is normal morphology and signal characteristics. POSTERIOR FOSSA, VERTEBRAL ARTERIES, PARASPINAL TISSUES: No MR findings of ligamentous injury. Vertebral artery flow voids present. Patchy pontine T2 hyperintensities compatible with chronic small vessel ischemic changes. DISC LEVELS: C2-3: No disc bulge, canal stenosis nor neural foraminal narrowing. Mild LEFT facet arthropathy. C3-4: Anterolisthesis. Severe LEFT facet arthropathy with effusion, likely reactive. Uncovertebral hypertrophy and small broad-based disc bulge. Mild canal stenosis. Mild RIGHT, moderate to severe LEFT neural foraminal narrowing. C4-5. Uncovertebral hypertrophy. Severe LEFT facet arthropathy. No canal stenosis. Mild LEFT neural foraminal narrowing. C5-6, C6-7: ACDF.  No canal stenosis or neural foraminal narrowing. C7-T1: No disc bulge, canal stenosis  nor neural foraminal narrowing. Moderate facet arthropathy. MRI THORACIC SPINE FINDINGS ALIGNMENT: Maintenance of the thoracic kyphosis. No malalignment. VERTEBRAE/DISCS: Vertebral bodies are intact. Intervertebral discs morphology and signal are normal, multilevel mild chronic discogenic endplate changes. No abnormal or acute bone marrow signal. CORD: Approximately 3 cm segment of dilated central spinal canal T6 through T7-8, to 2 mm without cord edema. PREVERTEBRAL AND PARASPINAL SOFT TISSUES: Nonacute. 2.5 cm T2 bright LEFT thyroid nodule. DISC LEVELS: No disc bulge, canal stenosis nor neural foraminal narrowing. IMPRESSION: MRI cervical spine: 1. Grade 1 C3-4 anterolisthesis. Severe LEFT C3-4 facet arthropathy and acute reactive changes. 2. No fracture or acute osseous process. C5 through C7 ACDF with arthrodesis. 3. Degenerative change of the cervical spine. Mild canal stenosis C3-4. 4. Moderate to severe LEFT C3-4 neural foraminal narrowing. MRI thoracic spine: 1. No fracture, malalignment or acute osseous process. 2. Mildly dilated midthoracic central spinal canal, borderline findings for syrinx. 3. **An incidental finding of potential clinical significance has been found. 2.5 cm LEFT thyroid nodule. Recommend NONEMERGENT thyroid sonogram. This follows ACR consensus guidelines: Managing Incidental Thyroid Nodules Detected on Imaging: White Paper of the ACR Incidental Thyroid Findings Committee. J Am Coll Radiol 2015; 12:143-150.** Electronically Signed   By: Elon Alas M.D.   On: 01/31/2018 01:29     EKG: Independently reviewed.  Sinus rhythm, QTC 459, anteroseptal infarction pattern, poor R wave progression  Assessment/Plan Principal Problem:   Leg weakness Active Problems:   Hyperlipidemia   IPF (idiopathic pulmonary fibrosis) (HCC)   Essential hypertension   GERD (gastroesophageal reflux disease)   Hypothyroidism   Thyroid nodule   Leg weakness: Etiology is not clear.  MRI showed  mildly dilated midthoracic central spinal canal, borderline findings for syrinx.  Dr. Leonel Ramsay recommended physical therapy and check magnesium level which is normal.  -We will place on MedSurg bed for observation - PT/OT -Check vitamin B12, folate, RPR -Follow-up Neurology's further recommendations  Hyperlipidemia: -Crestor  IPF (idiopathic pulmonary fibrosis) ([place]): -continue Esbriet -prn albuterol nebs  HTN:  -Continue home medications: Metoprolol -IV hydralazine prn  GERD: -Protonix  Hypothyroidism: Last TSH was 0.7 on 01/19/2018 -Continue home Synthroid  Thyroid nodule: MRI showed an incidental 2.5 cm LEFT thyroid nodule.  Her TSH was normal, 0.7 on 01/19/2018 -Follow-up with PCP    DVT ppx: SQ Lovenox Code Status: Full code Family Communication: Yes, patient's daughter and husband   at bed side Disposition Plan:  Anticipate discharge back to previous home environment Consults called: Neurology, Dr. Leonel Ramsay Admission status:  medical floor/obs  Date of Service 01/31/2018    Ivor Costa [facility] Pager [phone]  If 7PM-7AM, please contact night-coverage [email or web address] Password [facility] 01/31/2018, 6:08 AM

## 2018-01-31 NOTE — ED Provider Notes (Addendum)
Care assumed from Dr. Wilson Singer  Patient with generalized weakness waxing waning for several weeks with inability to ambulate and weakness on standing.  MRI of C and T-spine pending per neurology recommendations.  MRIs reviewed and show degenerative disc disease with facet arthropathy.  No fracture or demyelination. Thoracic syrinx noted.  Discussed with Dr. Leonel Ramsay of neurology who has seen patient.  Patient is hyperreflexic cannot stand and cannot walk due to spasms in her legs.  Dr. Leonel Ramsay feels that she will need medical admission.  Discussed with Dr. Blaine Hamper.   Ezequiel Essex, MD 01/31/18 8657    Ezequiel Essex, MD 01/31/18 [phone]

## 2018-02-01 ENCOUNTER — Observation Stay (HOSPITAL_COMMUNITY): Payer: Medicare Other

## 2018-02-01 DIAGNOSIS — D638 Anemia in other chronic diseases classified elsewhere: Secondary | ICD-10-CM

## 2018-02-01 DIAGNOSIS — K219 Gastro-esophageal reflux disease without esophagitis: Secondary | ICD-10-CM | POA: Diagnosis not present

## 2018-02-01 DIAGNOSIS — E041 Nontoxic single thyroid nodule: Secondary | ICD-10-CM | POA: Diagnosis not present

## 2018-02-01 DIAGNOSIS — D62 Acute posthemorrhagic anemia: Secondary | ICD-10-CM

## 2018-02-01 DIAGNOSIS — J84112 Idiopathic pulmonary fibrosis: Secondary | ICD-10-CM | POA: Diagnosis not present

## 2018-02-01 DIAGNOSIS — R269 Unspecified abnormalities of gait and mobility: Secondary | ICD-10-CM | POA: Diagnosis not present

## 2018-02-01 DIAGNOSIS — R29898 Other symptoms and signs involving the musculoskeletal system: Secondary | ICD-10-CM | POA: Diagnosis not present

## 2018-02-01 DIAGNOSIS — R5382 Chronic fatigue, unspecified: Secondary | ICD-10-CM

## 2018-02-01 DIAGNOSIS — E039 Hypothyroidism, unspecified: Secondary | ICD-10-CM | POA: Diagnosis not present

## 2018-02-01 DIAGNOSIS — I5032 Chronic diastolic (congestive) heart failure: Secondary | ICD-10-CM

## 2018-02-01 DIAGNOSIS — I10 Essential (primary) hypertension: Secondary | ICD-10-CM | POA: Diagnosis not present

## 2018-02-01 MED ORDER — NAPROXEN SODIUM 275 MG PO TABS
275.0000 mg | ORAL_TABLET | Freq: Two times a day (BID) | ORAL | Status: AC
  Administered 2018-02-01 (×2): 275 mg via ORAL
  Filled 2018-02-01 (×2): qty 1

## 2018-02-01 NOTE — Progress Notes (Signed)
PROGRESS NOTE    Tina Calderon  LGX:[id number] DOB: [birth year] DOA: 01/30/2018 PCP: [name], NP  Brief Narrative: Tina Calderon is a 77 y.o. female with medical history significant of pulmonary Fibrosis, hyperlipidemia, asthma, GERD, dCHF,  ILD, IPF, hypothyroidism, who presents with bilateral leg weakness and falls.   Assessment & Plan:     Bilateral leg weakness/hyperreflexia and increased tone -intermittent and ongoing for few months associated with intermittent falls and weakness -Neurology following, MRI thoracic and cervical spine completed, questionable mild T-spine spinal cord dilatation, suspicious for syrinx -Neuro started low dose tizanidine -Physical therapy consulting, CIR recommended -B-12 level is unremarkable -RPR is negative, CK normal -Unclear she needs a repeat MRI with contrast, await neuro input today  Hyperlipidemia: -Crestor held  IPF (idiopathic pulmonary fibrosis) ([place]): -continue Esbriet -prn albuterol nebs  HTN:  -Continue home medications: Metoprolol -IV hydralazine prn  GERD: -Protonix  Hypothyroidism: Last TSH was 0.7 on 01/19/2018 -Continue home Synthroid  Thyroid nodule: - MRI showed an incidental 2.5 cm LEFT thyroid nodule. -  Her TSH was normal, 0.7 on 01/19/2018 -Follow-up with PCP  History of palpitations, seeing cardiology -Was wearing an event monitor for the last 2 weeks -2-D echocardiogram-noted normal EF, wall motion, grade 1 diastolic dysfunction, dilated left atrium and right ventricle -follow-up with Dr. Al Pimple   DVT ppx: SQ Lovenox Code Status: Full code Family Communication: Yes, patient's daughter and husband   at bed side Disposition Plan:  await PT input  Consults : Neurology, Dr. Leonel Ramsay   Procedures:   Antimicrobials:    Subjective: -continues to have leg weakness, stiffness  Objective: Vitals:   01/31/18 1939 01/31/18 2334 02/01/18 0445 02/01/18 0755  BP: (!) 143/96  (!) 151/81 135/85 (!) 158/87  Pulse: 81 74 78 74  Resp: _0 Temp: 98.2 F (36.8 C) 97.8 F (36.6 C) 98.5 F (36.9 C) (!) 97.3 F (36.3 C)  TempSrc: Oral Oral Oral Oral  SpO2: 98% 94% 98% 94%  Weight:      Height:        Intake/Output Summary (Last 24 hours) at 02/01/2018 1200 Last data filed at 02/01/2018 0630 Gross per 24 hour  Intake 120 ml  Output 1000 ml  Net -880 ml   Filed Weights   01/30/18 1533 01/30/18 1550 01/31/18 0606  Weight: 59.9 kg 59.9 kg 58.6 kg    Examination: Gen: Awake, Alert, Oriented X 3,  HEENT: PERRLA, Neck supple, no JVD Lungs: Good air movement bilaterally, CTAB CVS: S1-S2/regular rate rhythm Abd: soft, Non tender, non distended, BS present Extremities: No Cyanosis, Clubbing or edema Skin: no new rashes Central nervous system: Alert and oriented, cranial nerves intact, upper extremity is 5 out of 5 strength, lower extremity 3+/5 bilaterally, increased tone, very brisk deep tendon reflexes Psychiatry: Judgement and insight appear normal. Mood & affect appropriate.     Data Reviewed:   CBC: Recent Labs  Lab 01/30/18 1604 01/31/18 0558  WBC 7.5 7.1  HGB 10.9* 10.6*  HCT 35.7* 33.4*  MCV 81.7 81.1  PLT 257 814   Basic Metabolic Panel: Recent Labs  Lab 01/29/18 0759 01/30/18 1604 01/31/18 0337 01/31/18 0558  NA 138 138  --  137  K 4.3 4.4  --  3.8  CL 102 104  --  101  CO2 29 26  --  28  GLUCOSE 95 109*  --  102*  BUN 12 9  --  7*  CREATININE 0.95 0.82  --  0.83  CALCIUM 9.4 9.1  --  9.0  MG  --   --  2.0  --    GFR: Estimated Creatinine Clearance: 44.2 mL/min (by C-G formula based on SCr of 0.83 mg/dL). Liver Function Tests: No results for input(s): AST, ALT, ALKPHOS, BILITOT, PROT, ALBUMIN in the last 168 hours. No results for input(s): LIPASE, AMYLASE in the last 168 hours. No results for input(s): AMMONIA in the last 168 hours. Coagulation Profile: No results for input(s): INR, PROTIME in the last 168  hours. Cardiac Enzymes: Recent Labs  Lab 01/31/18 0337  CKTOTAL 63   BNP (last 3 results) No results for input(s): PROBNP in the last 8760 hours. HbA1C: No results for input(s): HGBA1C in the last 72 hours. CBG: Recent Labs  Lab 01/30/18 1728  GLUCAP 85   Lipid Profile: No results for input(s): CHOL, HDL, LDLCALC, TRIG, CHOLHDL, LDLDIRECT in the last 72 hours. Thyroid Function Tests: No results for input(s): TSH, T4TOTAL, FREET4, T3FREE, THYROIDAB in the last 72 hours. Anemia Panel: Recent Labs    01/31/18 0558  VITAMINB12 477  FOLATE 35.3   Urine analysis:    Component Value Date/Time   COLORURINE STRAW (A) 01/30/2018 [place] 01/30/2018 1605   APPEARANCEUR Hazy 12/18/2013 1504   LABSPEC 1.005 01/30/2018 1605   LABSPEC 1.015 12/18/2013 1504   PHURINE 6.0 01/30/2018 1605   GLUCOSEU NEGATIVE 01/30/2018 1605   GLUCOSEU Negative 12/18/2013 1504   HGBUR NEGATIVE 01/30/2018 1605   BILIRUBINUR NEGATIVE 01/30/2018 1605   BILIRUBINUR Negative 12/18/2013 1504   KETONESUR NEGATIVE 01/30/2018 1605   PROTEINUR NEGATIVE 01/30/2018 1605   UROBILINOGEN negative 11/13/2011 0907   UROBILINOGEN 0.2 09/19/2009 0518   NITRITE NEGATIVE 01/30/2018 1605   LEUKOCYTESUR NEGATIVE 01/30/2018 1605   LEUKOCYTESUR 2+ 12/18/2013 1504   Sepsis Labs: _0 (procalcitonin:4,lacticidven:4)  )No results found for this or any previous visit (from the past 240 hour(s)).       Radiology Studies: Ct Head Wo Contrast  Result Date: 01/30/2018 CLINICAL DATA:  Syncopal episode EXAM: CT HEAD WITHOUT CONTRAST TECHNIQUE: Contiguous axial images were obtained from the base of the skull through the vertex without intravenous contrast. COMPARISON:  None. FINDINGS: Brain: No acute territorial infarction, hemorrhage or intracranial mass. Mild atrophy. Small vessel ischemic changes of the white matter. Stable ventricle size. Vascular: No hyperdense vessels. Scattered calcifications at  the carotid siphons. Skull: Normal. Negative for fracture or focal lesion. Sinuses/Orbits: No acute finding. Other: None IMPRESSION: No CT evidence for acute intracranial abnormality. Atrophy with small vessel ischemic changes of the white matter. Electronically Signed   By: Donavan Foil M.D.   On: 01/30/2018 19:04   Mr Cervical Spine Wo Contrast  Result Date: 01/31/2018 CLINICAL DATA:  Generalized weakness. History of cervical spine surgery, cancer. EXAM: MRI CERVICAL AND THORACIC SPINE WITHOUT CONTRAST TECHNIQUE: Multiplanar and multiecho pulse sequences of the cervical spine, to include the craniocervical junction and cervicothoracic junction, and thoracic spine, were obtained without intravenous contrast. COMPARISON:  CT HEAD January 30, 2018, MRI cervical spine January 02, 2009. FINDINGS: MRI CERVICAL SPINE FINDINGS ALIGNMENT: Straightened cervical lordosis. Grade 1 C3-4 anterolisthesis. VERTEBRAE/DISCS: Vertebral bodies are intact. Status post C5 through C7 ACDF with bridging bone marrow signal compatible with arthrodesis. Mild bone marrow edema LEFT C3-4 facets. Minimal C3-4 disc height loss with multilevel disc desiccation. CORD:Cervical spinal cord is normal morphology and signal characteristics. POSTERIOR FOSSA, VERTEBRAL ARTERIES, PARASPINAL TISSUES: No MR findings of ligamentous injury. Vertebral artery flow voids present. Patchy  pontine T2 hyperintensities compatible with chronic small vessel ischemic changes. DISC LEVELS: C2-3: No disc bulge, canal stenosis nor neural foraminal narrowing. Mild LEFT facet arthropathy. C3-4: Anterolisthesis. Severe LEFT facet arthropathy with effusion, likely reactive. Uncovertebral hypertrophy and small broad-based disc bulge. Mild canal stenosis. Mild RIGHT, moderate to severe LEFT neural foraminal narrowing. C4-5. Uncovertebral hypertrophy. Severe LEFT facet arthropathy. No canal stenosis. Mild LEFT neural foraminal narrowing. C5-6, C6-7: ACDF.  No canal stenosis or  neural foraminal narrowing. C7-T1: No disc bulge, canal stenosis nor neural foraminal narrowing. Moderate facet arthropathy. MRI THORACIC SPINE FINDINGS ALIGNMENT: Maintenance of the thoracic kyphosis. No malalignment. VERTEBRAE/DISCS: Vertebral bodies are intact. Intervertebral discs morphology and signal are normal, multilevel mild chronic discogenic endplate changes. No abnormal or acute bone marrow signal. CORD: Approximately 3 cm segment of dilated central spinal canal T6 through T7-8, to 2 mm without cord edema. PREVERTEBRAL AND PARASPINAL SOFT TISSUES: Nonacute. 2.5 cm T2 bright LEFT thyroid nodule. DISC LEVELS: No disc bulge, canal stenosis nor neural foraminal narrowing. IMPRESSION: MRI cervical spine: 1. Grade 1 C3-4 anterolisthesis. Severe LEFT C3-4 facet arthropathy and acute reactive changes. 2. No fracture or acute osseous process. C5 through C7 ACDF with arthrodesis. 3. Degenerative change of the cervical spine. Mild canal stenosis C3-4. 4. Moderate to severe LEFT C3-4 neural foraminal narrowing. MRI thoracic spine: 1. No fracture, malalignment or acute osseous process. 2. Mildly dilated midthoracic central spinal canal, borderline findings for syrinx. 3. **An incidental finding of potential clinical significance has been found. 2.5 cm LEFT thyroid nodule. Recommend NONEMERGENT thyroid sonogram. This follows ACR consensus guidelines: Managing Incidental Thyroid Nodules Detected on Imaging: White Paper of the ACR Incidental Thyroid Findings Committee. J Am Coll Radiol 2015; 12:143-150.** Electronically Signed   By: Elon Alas M.D.   On: 01/31/2018 01:29   Mr Thoracic Spine Wo Contrast  Result Date: 01/31/2018 CLINICAL DATA:  Generalized weakness. History of cervical spine surgery, cancer. EXAM: MRI CERVICAL AND THORACIC SPINE WITHOUT CONTRAST TECHNIQUE: Multiplanar and multiecho pulse sequences of the cervical spine, to include the craniocervical junction and cervicothoracic junction, and  thoracic spine, were obtained without intravenous contrast. COMPARISON:  CT HEAD January 30, 2018, MRI cervical spine January 02, 2009. FINDINGS: MRI CERVICAL SPINE FINDINGS ALIGNMENT: Straightened cervical lordosis. Grade 1 C3-4 anterolisthesis. VERTEBRAE/DISCS: Vertebral bodies are intact. Status post C5 through C7 ACDF with bridging bone marrow signal compatible with arthrodesis. Mild bone marrow edema LEFT C3-4 facets. Minimal C3-4 disc height loss with multilevel disc desiccation. CORD:Cervical spinal cord is normal morphology and signal characteristics. POSTERIOR FOSSA, VERTEBRAL ARTERIES, PARASPINAL TISSUES: No MR findings of ligamentous injury. Vertebral artery flow voids present. Patchy pontine T2 hyperintensities compatible with chronic small vessel ischemic changes. DISC LEVELS: C2-3: No disc bulge, canal stenosis nor neural foraminal narrowing. Mild LEFT facet arthropathy. C3-4: Anterolisthesis. Severe LEFT facet arthropathy with effusion, likely reactive. Uncovertebral hypertrophy and small broad-based disc bulge. Mild canal stenosis. Mild RIGHT, moderate to severe LEFT neural foraminal narrowing. C4-5. Uncovertebral hypertrophy. Severe LEFT facet arthropathy. No canal stenosis. Mild LEFT neural foraminal narrowing. C5-6, C6-7: ACDF.  No canal stenosis or neural foraminal narrowing. C7-T1: No disc bulge, canal stenosis nor neural foraminal narrowing. Moderate facet arthropathy. MRI THORACIC SPINE FINDINGS ALIGNMENT: Maintenance of the thoracic kyphosis. No malalignment. VERTEBRAE/DISCS: Vertebral bodies are intact. Intervertebral discs morphology and signal are normal, multilevel mild chronic discogenic endplate changes. No abnormal or acute bone marrow signal. CORD: Approximately 3 cm segment of dilated central spinal canal T6 through T7-8, to 2  mm without cord edema. PREVERTEBRAL AND PARASPINAL SOFT TISSUES: Nonacute. 2.5 cm T2 bright LEFT thyroid nodule. DISC LEVELS: No disc bulge, canal stenosis nor  neural foraminal narrowing. IMPRESSION: MRI cervical spine: 1. Grade 1 C3-4 anterolisthesis. Severe LEFT C3-4 facet arthropathy and acute reactive changes. 2. No fracture or acute osseous process. C5 through C7 ACDF with arthrodesis. 3. Degenerative change of the cervical spine. Mild canal stenosis C3-4. 4. Moderate to severe LEFT C3-4 neural foraminal narrowing. MRI thoracic spine: 1. No fracture, malalignment or acute osseous process. 2. Mildly dilated midthoracic central spinal canal, borderline findings for syrinx. 3. **An incidental finding of potential clinical significance has been found. 2.5 cm LEFT thyroid nodule. Recommend NONEMERGENT thyroid sonogram. This follows ACR consensus guidelines: Managing Incidental Thyroid Nodules Detected on Imaging: White Paper of the ACR Incidental Thyroid Findings Committee. J Am Coll Radiol 2015; 12:143-150.** Electronically Signed   By: Elon Alas M.D.   On: 01/31/2018 01:29        Scheduled Meds: . aspirin  81 mg Oral Daily  . calcium-vitamin D  1 tablet Oral q morning - 10a  . enoxaparin (LOVENOX) injection  40 mg Subcutaneous Q24H  . levothyroxine  88 mcg Oral QAC breakfast  . loratadine  10 mg Oral QPM  . metoprolol succinate  12.5 mg Oral Daily  . naproxen sodium  275 mg Oral BID WC  . pantoprazole  40 mg Oral Daily  . Pirfenidone  801 mg Oral TID WC  . rosuvastatin  5 mg Oral QPM  . tiZANidine  2 mg Oral TID   Continuous Infusions:   LOS: 0 days    Time spent: 54mn    PDomenic Polite MD [facility] Page via [email or web address], password [email or web address] After 7PM please contact night-coverage  02/01/2018, 12:00 PM

## 2018-02-01 NOTE — Assessment & Plan Note (Signed)
We talked about the differential for tachycardia.  She did not have any tachycardia on exam.  The plan was for me to increase her metoprolol and update cardiology/PCP in the meantime.  This was based on the fact that she was well-appearing at the time of the exam.  She agreed to this and I was walking her out of the clinic when she began to feel weak.  She did not feel any chest pain but felt weak overall.  She did not fall to ground.  She maintained her weight on her feet.  I did give her a hug gently just so she would not fall to the ground or lose her balance.  We sat her down on a rolling chair and moved back into an exam room.  When this episode happened her heart rate was still in the 80s.  Her pulse ox was in the 90s.  She was not tachycardic.  She had normal facial movement.  She was moving her extremities normally.  She had no pronator drift or other focal weakness.  She was rechecked multiple times while in the room.  We did know if this was potentially related to relative hypoglycemia so I gave the patient a snack.  That did not help with her symptoms.  We called her daughter who was nearby and would be able to potentially drive her home.  I talked with her about options in the meantime.  If she felt well enough to go home with her daughter then that may have been a viable option.  However the patient continued to feel weak.  She never displayed any focal neurologic deficits.  She did not have tachycardia on recheck in the exam room.  Since she was feeling persistently diffusely but not focally weak, and because she had persistent difficulty standing, we agreed to transport her via EMS to the ER.  By that point, her daughter arrived.  I discussed the situation with the patient, husband, daughter.  All agreed on the plan.  EMS arrived.  She was transported out of the clinic.  >40 minutes spent in face to face time with patient, >50% spent in counselling or coordination of care.

## 2018-02-01 NOTE — Progress Notes (Signed)
Rehab Admissions Coordinator Note:  Patient was screened by Cleatrice Burke for appropriateness for an Inpatient Acute Rehab Consult per PT and recommendation.  At this time, we are recommending Inpatient Rehab consult. Please place order.  Cleatrice Burke 02/01/2018, 1:23 PM  I can be reached at [phone].

## 2018-02-01 NOTE — Progress Notes (Addendum)
Subjective: Feeling a little bit stronger today. States that she still cannot walk.  Objective: Current vital signs: BP 113/63 (BP Location: Right Arm)   Pulse 82   Temp 97.7 F (36.5 C) (Oral)   Resp 18   Ht _0  (1.575 m)   Wt 58.6 kg   SpO2 99%   BMI 23.63 kg/m  Vital signs in last 24 hours: Temp:  [97.3 F (36.3 C)-98.5 F (36.9 C)] 97.7 F (36.5 C) (08/25 1246) Pulse Rate:  [74-82] 82 (08/25 1246) Resp:  [15-18] 18 (08/25 1246) BP: (113-158)/(63-96) 113/63 (08/25 1246) SpO2:  [94 %-99 %] 99 % (08/25 1246)  Intake/Output from previous day: 08/24 0701 - 08/25 0700 In: 120 [P.O.:120] Out: 1000 [Urine:1000] Intake/Output this shift: No intake/output data recorded. Nutritional status:  Diet Order            Diet Heart Room service appropriate? Yes; Fluid consistency: Thin  Diet effective now              Neurologic Exam: Ment: Alert and fully oriented. No aphasia.  CN: EOMI. Face symmetric. Phonation intact.  Motor: 5/5 bilateral upper extremities.  Lower extremities: She can flex at hips against resistance when bending knees in the bed in the supine position, but apparently cannot elevate straightened legs above the bed. Patient also with normal muscle bulk and tone, as well as finely coordinated movements observed of lower extremities when she is observed to spontaneously moves them under the sheets. Strength with lower extremity withdrawal is 4/5 maximum elicited, with poor and inconsistent effort by patient. Does not voluntarily resist examiner for any other strength testing of lower extremities.  Reflexes: 2+ brachioradialis and biceps. 3+ patellae, 2+ achilles. No asymmetry. Toes downgoing.  Cerebellar: No ataxia with FNF bilaterally.   Lab Results: Results for orders placed or performed during the hospital encounter of 01/30/18 (from the past 48 hour(s))  Basic metabolic panel     Status: Abnormal   Collection Time: 01/30/18  4:04 PM  Result Value Ref  Range   Sodium 138 135 - 145 mmol/L   Potassium 4.4 3.5 - 5.1 mmol/L   Chloride 104 98 - 111 mmol/L   CO2 26 22 - 32 mmol/L   Glucose, Bld 109 (H) 70 - 99 mg/dL   BUN 9 8 - 23 mg/dL   Creatinine, Ser 0.82 0.44 - 1.00 mg/dL   Calcium 9.1 8.9 - 10.3 mg/dL   GFR calc non Af Amer >60 >60 mL/min   GFR calc Af Amer >60 >60 mL/min    Comment: (NOTE) The eGFR has been calculated using the CKD EPI equation. This calculation has not been validated in all clinical situations. eGFR's persistently <60 mL/min signify possible Chronic Kidney Disease.    Anion gap 8 5 - 15    Comment: Performed at [facility] [address] [place], [place] [address]  CBC     Status: Abnormal   Collection Time: 01/30/18  4:04 PM  Result Value Ref Range   WBC 7.5 4.0 - 10.5 K/uL   RBC 4.37 3.87 - 5.11 MIL/uL   Hemoglobin 10.9 (L) 12.0 - 15.0 g/dL   HCT 35.7 (L) 36.0 - 46.0 %   MCV 81.7 78.0 - 100.0 fL   MCH 24.9 (L) 26.0 - 34.0 pg   MCHC 30.5 30.0 - 36.0 g/dL   RDW 14.8 11.5 - 15.5 %   Platelets 257 150 - 400 K/uL    Comment: Performed at [facility]  [facility]ospital Lab, [place] [address], [place], [place] [address]  Urinalysis, Routine w reflex microscopic     Status: Abnormal   Collection Time: 01/30/18  4:05 PM  Result Value Ref Range   Color, Urine STRAW (A) YELLOW   APPearance CLEAR CLEAR   Specific Gravity, Urine 1.005 1.005 - 1.030   pH 6.0 5.0 - 8.0   Glucose, UA NEGATIVE NEGATIVE mg/dL   Hgb urine dipstick NEGATIVE NEGATIVE   Bilirubin Urine NEGATIVE NEGATIVE   Ketones, ur NEGATIVE NEGATIVE mg/dL   Protein, ur NEGATIVE NEGATIVE mg/dL   Nitrite NEGATIVE NEGATIVE   Leukocytes, UA NEGATIVE NEGATIVE    Comment: Performed at [facility] [address], [place], [place] [address]  CBG monitoring, ED     Status: None   Collection Time: 01/30/18  5:28 PM  Result Value Ref Range   Glucose-Capillary 85 70 - 99 mg/dL  Magnesium     Status: None   Collection Time: 01/31/18  3:37 AM  Result Value Ref  Range   Magnesium 2.0 1.7 - 2.4 mg/dL    Comment: Performed at [facility], [place] [address], [place], [place] [address]  CK     Status: None   Collection Time: 01/31/18  3:37 AM  Result Value Ref Range   Total CK 63 38 - 234 U/L    Comment: Performed at [facility], [place] [address], [place], [place] [address]  Basic metabolic panel     Status: Abnormal   Collection Time: 01/31/18  5:58 AM  Result Value Ref Range   Sodium 137 135 - 145 mmol/L   Potassium 3.8 3.5 - 5.1 mmol/L   Chloride 101 98 - 111 mmol/L   CO2 28 22 - 32 mmol/L   Glucose, Bld 102 (H) 70 - 99 mg/dL   BUN 7 (L) 8 - 23 mg/dL   Creatinine, Ser 0.83 0.44 - 1.00 mg/dL   Calcium 9.0 8.9 - 10.3 mg/dL   GFR calc non Af Amer >60 >60 mL/min   GFR calc Af Amer >60 >60 mL/min    Comment: (NOTE) The eGFR has been calculated using the CKD EPI equation. This calculation has not been validated in all clinical situations. eGFR's persistently <60 mL/min signify possible Chronic Kidney Disease.    Anion gap 8 5 - 15    Comment: Performed at [facility] [address] [place], [place] [address]  CBC     Status: Abnormal   Collection Time: 01/31/18  5:58 AM  Result Value Ref Range   WBC 7.1 4.0 - 10.5 K/uL   RBC 4.12 3.87 - 5.11 MIL/uL   Hemoglobin 10.6 (L) 12.0 - 15.0 g/dL   HCT 33.4 (L) 36.0 - 46.0 %   MCV 81.1 78.0 - 100.0 fL   MCH 25.7 (L) 26.0 - 34.0 pg   MCHC 31.7 30.0 - 36.0 g/dL   RDW 14.8 11.5 - 15.5 %   Platelets 220 150 - 400 K/uL    Comment: Performed at [facility], [place] [address], [place], [place] [address]  Vitamin B12     Status: None   Collection Time: 01/31/18  5:58 AM  Result Value Ref Range   Vitamin B-12 477 180 - 914 pg/mL    Comment: (NOTE) This assay is not validated for testing neonatal or myeloproliferative syndrome specimens for Vitamin B12 levels. Performed at [facility], [place] [address], [place], [place] [address]   Folate  Status: None   Collection  Time: 01/31/18  5:58 AM  Result Value Ref Range   Folate 35.3 >5.9 ng/mL    Comment: Performed at [facility], [place] [address], [place], [place] [address]  RPR     Status: None   Collection Time: 01/31/18  5:58 AM  Result Value Ref Range   RPR Ser Ql Non Reactive Non Reactive    Comment: (NOTE) Performed At: [facility] [place], [place] [id number] Rush Farmer MD IR:[id number]   Brain natriuretic peptide     Status: Abnormal   Collection Time: 01/31/18  5:58 AM  Result Value Ref Range   B Natriuretic Peptide 203.2 (H) 0.0 - 100.0 pg/mL    Comment: Performed at [facility] [address] [place], [place] [address]    No results found for this or any previous visit (from the past 240 hour(s)).  Lipid Panel No results for input(s): CHOL, TRIG, HDL, CHOLHDL, VLDL, LDLCALC in the last 72 hours.  Studies/Results: Ct Head Wo Contrast  Result Date: 01/30/2018 CLINICAL DATA:  Syncopal episode EXAM: CT HEAD WITHOUT CONTRAST TECHNIQUE: Contiguous axial images were obtained from the base of the skull through the vertex without intravenous contrast. COMPARISON:  None. FINDINGS: Brain: No acute territorial infarction, hemorrhage or intracranial mass. Mild atrophy. Small vessel ischemic changes of the white matter. Stable ventricle size. Vascular: No hyperdense vessels. Scattered calcifications at the carotid siphons. Skull: Normal. Negative for fracture or focal lesion. Sinuses/Orbits: No acute finding. Other: None IMPRESSION: No CT evidence for acute intracranial abnormality. Atrophy with small vessel ischemic changes of the white matter. Electronically Signed   By: Donavan Foil M.D.   On: 01/30/2018 19:04   Mr Cervical Spine Wo Contrast  Result Date: 01/31/2018 CLINICAL DATA:  Generalized weakness. History of cervical spine surgery, cancer. EXAM: MRI CERVICAL AND THORACIC SPINE WITHOUT CONTRAST TECHNIQUE: Multiplanar and multiecho pulse sequences of the  cervical spine, to include the craniocervical junction and cervicothoracic junction, and thoracic spine, were obtained without intravenous contrast. COMPARISON:  CT HEAD January 30, 2018, MRI cervical spine January 02, 2009. FINDINGS: MRI CERVICAL SPINE FINDINGS ALIGNMENT: Straightened cervical lordosis. Grade 1 C3-4 anterolisthesis. VERTEBRAE/DISCS: Vertebral bodies are intact. Status post C5 through C7 ACDF with bridging bone marrow signal compatible with arthrodesis. Mild bone marrow edema LEFT C3-4 facets. Minimal C3-4 disc height loss with multilevel disc desiccation. CORD:Cervical spinal cord is normal morphology and signal characteristics. POSTERIOR FOSSA, VERTEBRAL ARTERIES, PARASPINAL TISSUES: No MR findings of ligamentous injury. Vertebral artery flow voids present. Patchy pontine T2 hyperintensities compatible with chronic small vessel ischemic changes. DISC LEVELS: C2-3: No disc bulge, canal stenosis nor neural foraminal narrowing. Mild LEFT facet arthropathy. C3-4: Anterolisthesis. Severe LEFT facet arthropathy with effusion, likely reactive. Uncovertebral hypertrophy and small broad-based disc bulge. Mild canal stenosis. Mild RIGHT, moderate to severe LEFT neural foraminal narrowing. C4-5. Uncovertebral hypertrophy. Severe LEFT facet arthropathy. No canal stenosis. Mild LEFT neural foraminal narrowing. C5-6, C6-7: ACDF.  No canal stenosis or neural foraminal narrowing. C7-T1: No disc bulge, canal stenosis nor neural foraminal narrowing. Moderate facet arthropathy. MRI THORACIC SPINE FINDINGS ALIGNMENT: Maintenance of the thoracic kyphosis. No malalignment. VERTEBRAE/DISCS: Vertebral bodies are intact. Intervertebral discs morphology and signal are normal, multilevel mild chronic discogenic endplate changes. No abnormal or acute bone marrow signal. CORD: Approximately 3 cm segment of dilated central spinal canal T6 through T7-8, to 2 mm without cord edema. PREVERTEBRAL AND PARASPINAL SOFT TISSUES: Nonacute.  2.5 cm  T2 bright LEFT thyroid nodule. DISC LEVELS: No disc bulge, canal stenosis nor neural foraminal narrowing. IMPRESSION: MRI cervical spine: 1. Grade 1 C3-4 anterolisthesis. Severe LEFT C3-4 facet arthropathy and acute reactive changes. 2. No fracture or acute osseous process. C5 through C7 ACDF with arthrodesis. 3. Degenerative change of the cervical spine. Mild canal stenosis C3-4. 4. Moderate to severe LEFT C3-4 neural foraminal narrowing. MRI thoracic spine: 1. No fracture, malalignment or acute osseous process. 2. Mildly dilated midthoracic central spinal canal, borderline findings for syrinx. 3. **An incidental finding of potential clinical significance has been found. 2.5 cm LEFT thyroid nodule. Recommend NONEMERGENT thyroid sonogram. This follows ACR consensus guidelines: Managing Incidental Thyroid Nodules Detected on Imaging: White Paper of the ACR Incidental Thyroid Findings Committee. J Am Coll Radiol 2015; 12:143-150.** Electronically Signed   By: Elon Alas M.D.   On: 01/31/2018 01:29   Mr Thoracic Spine Wo Contrast  Result Date: 01/31/2018 CLINICAL DATA:  Generalized weakness. History of cervical spine surgery, cancer. EXAM: MRI CERVICAL AND THORACIC SPINE WITHOUT CONTRAST TECHNIQUE: Multiplanar and multiecho pulse sequences of the cervical spine, to include the craniocervical junction and cervicothoracic junction, and thoracic spine, were obtained without intravenous contrast. COMPARISON:  CT HEAD January 30, 2018, MRI cervical spine January 02, 2009. FINDINGS: MRI CERVICAL SPINE FINDINGS ALIGNMENT: Straightened cervical lordosis. Grade 1 C3-4 anterolisthesis. VERTEBRAE/DISCS: Vertebral bodies are intact. Status post C5 through C7 ACDF with bridging bone marrow signal compatible with arthrodesis. Mild bone marrow edema LEFT C3-4 facets. Minimal C3-4 disc height loss with multilevel disc desiccation. CORD:Cervical spinal cord is normal morphology and signal characteristics. POSTERIOR FOSSA,  VERTEBRAL ARTERIES, PARASPINAL TISSUES: No MR findings of ligamentous injury. Vertebral artery flow voids present. Patchy pontine T2 hyperintensities compatible with chronic small vessel ischemic changes. DISC LEVELS: C2-3: No disc bulge, canal stenosis nor neural foraminal narrowing. Mild LEFT facet arthropathy. C3-4: Anterolisthesis. Severe LEFT facet arthropathy with effusion, likely reactive. Uncovertebral hypertrophy and small broad-based disc bulge. Mild canal stenosis. Mild RIGHT, moderate to severe LEFT neural foraminal narrowing. C4-5. Uncovertebral hypertrophy. Severe LEFT facet arthropathy. No canal stenosis. Mild LEFT neural foraminal narrowing. C5-6, C6-7: ACDF.  No canal stenosis or neural foraminal narrowing. C7-T1: No disc bulge, canal stenosis nor neural foraminal narrowing. Moderate facet arthropathy. MRI THORACIC SPINE FINDINGS ALIGNMENT: Maintenance of the thoracic kyphosis. No malalignment. VERTEBRAE/DISCS: Vertebral bodies are intact. Intervertebral discs morphology and signal are normal, multilevel mild chronic discogenic endplate changes. No abnormal or acute bone marrow signal. CORD: Approximately 3 cm segment of dilated central spinal canal T6 through T7-8, to 2 mm without cord edema. PREVERTEBRAL AND PARASPINAL SOFT TISSUES: Nonacute. 2.5 cm T2 bright LEFT thyroid nodule. DISC LEVELS: No disc bulge, canal stenosis nor neural foraminal narrowing. IMPRESSION: MRI cervical spine: 1. Grade 1 C3-4 anterolisthesis. Severe LEFT C3-4 facet arthropathy and acute reactive changes. 2. No fracture or acute osseous process. C5 through C7 ACDF with arthrodesis. 3. Degenerative change of the cervical spine. Mild canal stenosis C3-4. 4. Moderate to severe LEFT C3-4 neural foraminal narrowing. MRI thoracic spine: 1. No fracture, malalignment or acute osseous process. 2. Mildly dilated midthoracic central spinal canal, borderline findings for syrinx. 3. **An incidental finding of potential clinical  significance has been found. 2.5 cm LEFT thyroid nodule. Recommend NONEMERGENT thyroid sonogram. This follows ACR consensus guidelines: Managing Incidental Thyroid Nodules Detected on Imaging: White Paper of the ACR Incidental Thyroid Findings Committee. J Am Coll Radiol 2015; 12:143-150.** Electronically Signed   By: Thana Farr.D.  On: 01/31/2018 01:29    Medications:  Scheduled: . aspirin  81 mg Oral Daily  . calcium-vitamin D  1 tablet Oral q morning - 10a  . enoxaparin (LOVENOX) injection  40 mg Subcutaneous Q24H  . levothyroxine  88 mcg Oral QAC breakfast  . loratadine  10 mg Oral QPM  . metoprolol succinate  12.5 mg Oral Daily  . naproxen sodium  275 mg Oral BID WC  . pantoprazole  40 mg Oral Daily  . Pirfenidone  801 mg Oral TID WC  . rosuvastatin  5 mg Oral QPM  . tiZANidine  2 mg Oral TID     Assessment: 77 year old female with difficulty walking and cramps 1. Hyperreflexia on exam appears non-pathological and most likely incidental. No associated spasticity noted.  2. Patient states that she still cannot walk, but is starting to feel stronger. She would like to be discharged to the inpatient rehab service.  3. Weakness on exam appears functional. She can flex at hips against resistance when bending knees in the bed in the supine position, but apparently cannot elevate straightened legs above the bed. Patient also with normal muscle bulk and tone, as well as finely coordinated movements observed of lower extremities when she is observed to spontaneously moves them under the sheets.  4. Overall presentation most consistent with a non-physiologic etiology such as conversion disorder or secondary gain. No findings on spine MRI studies to suggest a myelopathic process. The mildly dilated central canal appears most likely to be incidental based upon my review of the images.   Recommendations: 1. Discontinuing Zanaflex 2. PT/OT 3. Rehab is recommended. The patient would  prefer this to be inpatient.   4. Add on MRI cervical and thoracic with postcontrast images only has been ordered. If negative, Neurology will sign off.     LOS: 0 days   _0  signed: Dr. Kerney Elbe 02/01/2018  2:26 PM

## 2018-02-02 ENCOUNTER — Telehealth: Payer: Self-pay

## 2018-02-02 ENCOUNTER — Telehealth: Payer: Self-pay | Admitting: Emergency Medicine

## 2018-02-02 ENCOUNTER — Observation Stay (HOSPITAL_COMMUNITY): Payer: Medicare Other

## 2018-02-02 DIAGNOSIS — R292 Abnormal reflex: Secondary | ICD-10-CM

## 2018-02-02 DIAGNOSIS — J849 Interstitial pulmonary disease, unspecified: Secondary | ICD-10-CM

## 2018-02-02 DIAGNOSIS — D638 Anemia in other chronic diseases classified elsewhere: Secondary | ICD-10-CM

## 2018-02-02 DIAGNOSIS — R531 Weakness: Secondary | ICD-10-CM | POA: Diagnosis not present

## 2018-02-02 DIAGNOSIS — D62 Acute posthemorrhagic anemia: Secondary | ICD-10-CM

## 2018-02-02 DIAGNOSIS — F449 Dissociative and conversion disorder, unspecified: Secondary | ICD-10-CM

## 2018-02-02 DIAGNOSIS — R269 Unspecified abnormalities of gait and mobility: Secondary | ICD-10-CM | POA: Diagnosis not present

## 2018-02-02 DIAGNOSIS — I5032 Chronic diastolic (congestive) heart failure: Secondary | ICD-10-CM

## 2018-02-02 MED ORDER — GADOBENATE DIMEGLUMINE 529 MG/ML IV SOLN
15.0000 mL | Freq: Once | INTRAVENOUS | Status: AC | PRN
  Administered 2018-02-02: 12 mL via INTRAVENOUS

## 2018-02-02 NOTE — Telephone Encounter (Signed)
Called patient, unable to reach left message to give [place] a call back.

## 2018-02-02 NOTE — NC FL2 (Signed)
[place] LEVEL OF CARE SCREENING TOOL     IDENTIFICATION  Patient Name: Tina Calderon Birthdate: 1940/10/27 Sex: female Admission Date (Current Location): 01/30/2018  [facility] and [place] Number:  Herbalist and Address:  [facility] [place] [address] [place], [place] [address]      Provider Number: [id number]  Attending Physician Name and Address:  Domenic Polite, MD  Relative Name and Phone Number:       Current Level of Care: Hospital Recommended Level of Care: [place] Prior Approval Number:    Date Approved/Denied:   PASRR Number: [id number]  Discharge Plan: SNF    Current Diagnoses: Patient Active Problem List   Diagnosis Date Noted  . Gait abnormality   . Chronic diastolic congestive heart failure ([place])   . Acute blood loss anemia   . Anemia of chronic disease   . Leg weakness 01/31/2018  . Hypothyroidism 01/31/2018  . Thyroid nodule 01/31/2018  . Tachycardia 01/19/2018  . Prediabetes 12/18/2017  . Preventative health care 12/18/2017  . GERD (gastroesophageal reflux disease) 05/12/2017  . Essential hypertension 09/25/2016  . Cough variant asthma 06/13/2016  . Allergic rhinitis 01/29/2016  . Mild intermittent asthma 01/29/2016  . Cough 01/03/2015  . Research subject 08/03/2014  . Chronic fatigue 07/15/2014  . Research study patient 06/28/2014  . IPF (idiopathic pulmonary fibrosis) ([place]) 02/04/2012  . Orthostatic hypotension 01/24/2012  . Normocytic anemia 01/22/2012  . Hyperlipidemia 01/22/2012  . Anemia 01/20/2012  . Thyroid disease   . Pelvic relaxation 11/11/2011    Orientation RESPIRATION BLADDER Height & Weight     Self, Time, Place, Situation  Normal Continent Weight: 129 lb 3 oz (58.6 kg) Height:  _0  (157.5 cm)  BEHAVIORAL SYMPTOMS/MOOD NEUROLOGICAL BOWEL NUTRITION STATUS      Continent Diet(heart healthy)  AMBULATORY STATUS COMMUNICATION OF NEEDS Skin   Extensive  Assist Verbally Normal                       Personal Care Assistance Level of Assistance  Bathing, Feeding, Dressing Bathing Assistance: Limited assistance Feeding assistance: Independent Dressing Assistance: Limited assistance     Functional Limitations Info  Sight, Hearing, Speech Sight Info: Adequate Hearing Info: Adequate Speech Info: Adequate    SPECIAL CARE FACTORS FREQUENCY  PT (By licensed PT), OT (By licensed OT)     PT Frequency: 5x/wk OT Frequency: 5x/wk            Contractures Contractures Info: Not present    Additional Factors Info  Code Status, Allergies Code Status Info: Full Allergies Info: Dilaudid Hydromorphone           Current Medications (02/02/2018):  This is the current hospital active medication list Current Facility-Administered Medications  Medication Dose Route Frequency Provider Last Rate Last Dose  . acetaminophen (TYLENOL) tablet 650 mg  650 mg Oral Q6H PRN Ivor Costa, MD   650 mg at 02/01/18 5621   Or  . acetaminophen (TYLENOL) suppository 650 mg  650 mg Rectal Q6H PRN Ivor Costa, MD      . albuterol (PROVENTIL) (2.5 MG/3ML) 0.083% nebulizer solution 2.5 mg  2.5 mg Nebulization Q4H PRN Ivor Costa, MD      . aspirin chewable tablet 81 mg  81 mg Oral Daily Ivor Costa, MD   81 mg at 02/02/18 0944  . calcium-vitamin D (OSCAL WITH D) 500-200 MG-UNIT per tablet 1 tablet  1 tablet Oral q morning -  Ninfa Meeker, MD   1 tablet at 02/02/18 [phone]  . dextromethorphan-guaiFENesin (MUCINEX DM) 30-600 MG per 12 hr tablet 1 tablet  1 tablet Oral BID PRN Ivor Costa, MD      . enoxaparin (LOVENOX) injection 40 mg  40 mg Subcutaneous Q24H Ivor Costa, MD   40 mg at 02/02/18 0747  . hydrALAZINE (APRESOLINE) injection 5 mg  5 mg Intravenous Q2H PRN Ivor Costa, MD      . levothyroxine (SYNTHROID, LEVOTHROID) tablet 88 mcg  88 mcg Oral QAC breakfast Ivor Costa, MD   88 mcg at 02/02/18 0747  . loratadine (CLARITIN) tablet 10 mg  10 mg Oral QPM Ivor Costa, MD   10 mg at 02/01/18 1739  . metoprolol succinate (TOPROL-XL) 24 hr tablet 12.5 mg  12.5 mg Oral Daily Ivor Costa, MD   12.5 mg at 02/02/18 0944  . ondansetron (ZOFRAN) tablet 4 mg  4 mg Oral Q6H PRN Ivor Costa, MD       Or  . ondansetron [facility]) injection 4 mg  4 mg Intravenous Q6H PRN Ivor Costa, MD      . pantoprazole (PROTONIX) EC tablet 40 mg  40 mg Oral Daily Ivor Costa, MD   40 mg at 02/02/18 0944  . Pirfenidone (Esbriet) 801 mg per 3 capsules (patient's own supply)  801 mg Oral TID WC Ivor Costa, MD   801 mg at 02/02/18 1146  . rosuvastatin (CRESTOR) tablet 5 mg  5 mg Oral QPM Ivor Costa, MD   5 mg at 02/01/18 1739  . senna-docusate (Senokot-S) tablet 1 tablet  1 tablet Oral QHS PRN Ivor Costa, MD      . zolpidem (AMBIEN) tablet 5 mg  5 mg Oral QHS PRN Ivor Costa, MD         Discharge Medications: Please see discharge summary for a list of discharge medications.  Relevant Imaging Results:  Relevant Lab Results:   Additional Information SS#: [id number]  Geralynn Ochs, LCSW

## 2018-02-02 NOTE — Clinical Social Work Note (Signed)
Clinical Social Work Assessment  Patient Details  Name: Tina Calderon MRN: [id number] Date of Birth: [birth year]  Date of referral:  02/02/18               Reason for consult:  Facility Placement                Permission sought to share information with:  Facility Sport and exercise psychologist, Family Supports Permission granted to share information::  Yes, Verbal Permission Granted  Name::     Venita Lick  Agency::  SNF  Relationship::  Spouse, daughter  Contact Information:     Housing/Transportation Living arrangements for the past 2 months:  Apartment Source of Information:  Patient, Scientist, water quality, Spouse Patient Interpreter Needed:  None Criminal Activity/Legal Involvement Pertinent to Current Situation/Hospitalization:  No - Comment as needed Significant Relationships:  Adult Children, Spouse, Other Family Members Lives with:  Self, Spouse Do you feel safe going back to the place where you live?  Yes Need for family participation in patient care:  No (Coment)  Care giving concerns:  Patient from home with spouse, but spouse is unable to assist with care as he has his own health issues. Patient is currently unable to walk and will need short term rehab at discharge prior to returning home with husband.   Social Worker assessment / plan:  CSW met with patient and patient's husband at bedside to discuss discharge plan. CSW discussed SNF placement and process for discharge plan. CSW answered questions about patient's observation status (RNCM had been in prior to [place]) and validated patient's frustrations. CSW to fax out referral and follow.  Employment status:  Retired Nurse, adult PT Recommendations:  Inpatient [place] / Referral to community resources:  [place]  Patient/Family's Response to care:  Patient agreeable to SNF placement.  Patient/Family's Understanding of and Emotional Response to Diagnosis, Current Treatment,  and Prognosis:  Patient discussed feeling frustrated that the doctors can't figure out what's causing her to have tremors. Patient and patient's husband had questions about observation status and why Medicare wouldn't cover her hospital bills. Patient and patient's husband are hopeful that the patient can improve and be able to walk again and go home.  Emotional Assessment Appearance:  Appears stated age Attitude/Demeanor/Rapport:  Engaged Affect (typically observed):  Anxious, Pleasant, Frustrated Orientation:  Oriented to Self, Oriented to Place, Oriented to  Time, Oriented to Situation Alcohol / Substance use:  Not Applicable Psych involvement (Current and /or in the community):  No (Comment)  Discharge Needs  Concerns to be addressed:  Care Coordination Readmission within the last 30 days:  No Current discharge risk:  Physical Impairment, Dependent with Mobility Barriers to Discharge:  Continued Medical Work up, [place], [place] 02/02/2018, 12:28 PM

## 2018-02-02 NOTE — Telephone Encounter (Signed)
Patient called to say that she is at [facility]. Her heart rate over the weekend was in the 180's. She went to the doctor and her legs collapsed while being treated. She will let us know this week if she is able to return to [place].

## 2018-02-02 NOTE — Progress Notes (Signed)
Physical Therapy Treatment Patient Details Name: Tina Calderon MRN: [id number] DOB: February 23, 1941 Today's Date: 02/02/2018    History of Present Illness Tina Calderon is a 77 y.o. female with medical history significant of hypertension, hyperlipidemia, asthma, GERD, dCHF,  ILD, IPF, hypothyroidism, who presents with bilateral leg weakness, intermittent hypertonicity, and shaking of legs (R>L). 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 Pt requires +2 assist for OOB transfers however did improve with bilat LE stability with each sit to stand trial. Upon arrival pt presented with bilat LE tremors while in bed and end of session no tremors noted. Continue to progress as tolerated.    Follow Up Recommendations  CIR     Equipment Recommendations  (TBD)    Recommendations for Other Services Rehab consult     Precautions / Restrictions Precautions Precautions: Fall Restrictions Weight Bearing Restrictions: No    Mobility  Bed Mobility Overal bed mobility: Needs Assistance Bed Mobility: Supine to Sit     Supine to sit: Min guard        Transfers Overall transfer level: Needs assistance Equipment used: 2 person hand held assist Transfers: Sit to/from [facility] Sit to Stand: Mod assist;+2 physical assistance Stand pivot transfers: Mod assist;+2 safety/equipment       General transfer comment: Pt stood X2 from EOB and X1 from recliner; initial stand pt required increased assistance to power up, achieve upright posture, and bilat LE blocked for stability; with next trials pt able to improve ability to stand without bilat knees blocked and then able to take pivotal steps to recliner   Ambulation/Gait             General Gait Details: unable at this time   Stairs             Wheelchair Mobility    Modified Rankin (Stroke Patients  Only)       Balance Overall balance assessment: Needs assistance Sitting-balance support: No upper extremity supported;Feet supported Sitting balance-Leahy Scale: Fair       Standing balance-Leahy Scale: Poor                              Cognition Arousal/Alertness: Awake/alert Behavior During Therapy: Anxious Overall Cognitive Status: Within Functional Limits for tasks assessed                                 General Comments: get becomes very anxious with attempts of OOB mobility       Exercises      General Comments        Pertinent Vitals/Pain Pain Assessment: No/denies pain    Home Living                      Prior Function            PT Goals (current goals can now be found in the care plan section) Acute Rehab PT Goals Patient Stated Goal: to get better PT Goal Formulation: With patient Time For Goal Achievement: 02/14/18 Potential to Achieve Goals: Fair Progress towards PT goals: Progressing toward goals    Frequency    Min 3X/week      PT Plan Current plan remains appropriate    Co-evaluation  AM-PAC PT "6 Clicks" Daily Activity  Outcome Measure  Difficulty turning over in bed (including adjusting bedclothes, sheets and blankets)?: A Lot Difficulty moving from lying on back to sitting on the side of the bed? : A Lot Difficulty sitting down on and standing up from a chair with arms (e.g., wheelchair, bedside commode, etc,.)?: Unable Help needed moving to and from a bed to chair (including a wheelchair)?: A Lot Help needed walking in hospital room?: A Lot Help needed climbing 3-5 steps with a railing? : Total 6 Click Score: 10    End of Session Equipment Utilized During Treatment: Gait belt Activity Tolerance: Patient tolerated treatment well Patient left: with call bell/phone within reach;in chair;with chair alarm set Nurse Communication: Mobility status PT Visit Diagnosis:  Unsteadiness on feet (R26.81);Muscle weakness (generalized) (M62.81);Difficulty in walking, not elsewhere classified (R26.2)     Time: 4818-5631 PT Time Calculation (min) (ACUTE ONLY): 26 min  Charges:  $Therapeutic Activity: 23-37 mins                     Tina Calderon, PTA Pager: [phone]     Darliss Cheney 02/02/2018, 5:04 PM

## 2018-02-02 NOTE — Progress Notes (Signed)
Subjective: No complaints other than still feeling weak in the LE.  States it has been progressively worsening over last week but significantly this past Friday.   Exam: Vitals:   02/02/18 0404 02/02/18 0818  BP: (!) 159/86 138/70  Pulse: 74 100  Resp: 18 18  Temp: 97.8 F (36.6 C) 97.7 F (36.5 C)  SpO2: 98% 97%    Physical Exam   HEENT-  Normocephalic, no lesions, without obvious abnormality.  Normal external eye and conjunctiva.   Extremities- Warm, dry and intact Musculoskeletal-no joint tenderness, deformity or swelling Skin-warm and dry, no hyperpigmentation, vitiligo, or suspicious lesions    Neuro:  Mental Status: Alert, oriented, thought content appropriate.  Speech fluent without evidence of aphasia.  Able to follow 3 step commands without difficulty. Cranial Nerves: II:  Visual fields grossly normal,  III,IV, VI: ptosis not present, extra-ocular motions intact bilaterally pupils equal, round, reactive to light and accommodation V,VII: smile symmetric, facial light touch sensation normal bilaterally VIII: hearing normal bilaterally IX,X: uvula rises midline XI: bilateral shoulder shrug XII: midline tongue extension Motor: Right : Upper extremity   5/5    Left:     Upper extremity   5/5  Lower extremity   5/5     Lower extremity   5/5 States she cannot move bilateral legs off bed when held out straight but able to bend leg at hip and does so 5/5, hip Abd/Add 5/5 when leg held off bed knee flex and extension 5/5, DF/PF/ 5/5. When held with one finger able to lift leg off bed 2 feet with no help. intermittent positive hoovers Sensory: Pinprick and light touch intact throughout, bilaterally. LE to pinprick?ltght touch/ temperature and proprioception Deep Tendon Reflexes: 2+ and symmetric throughout UE, 3+ bilateral KJ--right leg shows sustained close but of interest when I squeeze quadricept and state this should help she will stop the clonus. No clonus of AK and  1+ Plantars: Right: downgoing   Left: downgoing Cerebellar: normal finger-to-nose,  and normal heel-to-shin test Gait: Watched OT and had no clonus and able to stand on her own    Medications:  Scheduled: . aspirin  81 mg Oral Daily  . calcium-vitamin D  1 tablet Oral q morning - 10a  . enoxaparin (LOVENOX) injection  40 mg Subcutaneous Q24H  . levothyroxine  88 mcg Oral QAC breakfast  . loratadine  10 mg Oral QPM  . metoprolol succinate  12.5 mg Oral Daily  . pantoprazole  40 mg Oral Daily  . Pirfenidone  801 mg Oral TID WC  . rosuvastatin  5 mg Oral QPM   Continuous:   Pertinent Labs/Diagnostics:   Mr Cervical Spine W Contrast  Result Date: 02/02/2018 CLINICAL DATA:  77 y/o F; weakness in both legs for 3 weeks worsening today. Worsening left leg weakness. EXAM: MRI CERVICAL SPINE WITHOUT CONTRAST MRI THORACIC SPINE WITHOUT CONTRAST TECHNIQUE: Multiplanar and multiecho pulse sequences of the cervical and thoracic spine were obtained without intravenous contrast. COMPARISON:  01/31/2018 cervical and thoracic spine MRI. FINDINGS: MRI CERVICAL SPINE FINDINGS Alignment: Straightening of cervical lordosis. Grade 1 C3-4 anterolisthesis. Vertebrae: No abnormal enhancement. C5-C7 ACDF. Susceptibility artifact from fusion hardware partially obscures the vertebral bodies at those levels. Cord: No abnormal enhancement. Posterior Fossa, vertebral arteries, paraspinal tissues: Negative. The thyroid gland is excluded from the field of view by fat saturation banding. MRI THORACIC SPINE FINDINGS Alignment:  Physiologic. Vertebrae: No abnormal enhancement. Cord:  T6-T8 dilated central spinal canal.  No abnormal enhancement.  Paraspinal and other soft tissues: Negative. IMPRESSION: 1. No abnormal enhancement of the cervical or thoracic spine for spinal cord. 2. T6-T8 dilated central spinal canal. No associated enhancing spinal cord lesion. Electronically Signed   By: Kristine Garbe M.D.   On:  02/02/2018 01:34   Mr Thoracic Spine W Contrast  Result Date: 02/02/2018 CLINICAL DATA:  77 y/o F; weakness in both legs for 3 weeks worsening today. Worsening left leg weakness. EXAM: MRI CERVICAL SPINE WITHOUT CONTRAST MRI THORACIC SPINE WITHOUT CONTRAST TECHNIQUE: Multiplanar and multiecho pulse sequences of the cervical and thoracic spine were obtained without intravenous contrast. COMPARISON:  01/31/2018 cervical and thoracic spine MRI. FINDINGS: MRI CERVICAL SPINE FINDINGS Alignment: Straightening of cervical lordosis. Grade 1 C3-4 anterolisthesis. Vertebrae: No abnormal enhancement. C5-C7 ACDF. Susceptibility artifact from fusion hardware partially obscures the vertebral bodies at those levels. Cord: No abnormal enhancement. Posterior Fossa, vertebral arteries, paraspinal tissues: Negative. The thyroid gland is excluded from the field of view by fat saturation banding. MRI THORACIC SPINE FINDINGS Alignment:  Physiologic. Vertebrae: No abnormal enhancement. Cord:  T6-T8 dilated central spinal canal.  No abnormal enhancement. Paraspinal and other soft tissues: Negative. IMPRESSION: 1. No abnormal enhancement of the cervical or thoracic spine for spinal cord. 2. T6-T8 dilated central spinal canal. No associated enhancing spinal cord lesion. Electronically Signed   By: Kristine Garbe M.D.   On: 02/02/2018 01:34     Etta Quill PA-C [facility] [phone]   Assessment: 77 YO female which continues to have hyperflexia and clonus of right leg which still appears to be functions--no spacticity, increased tone of clonus at achilles. Walking with OT without issues. functional weakness as above. Overall presentation most consistent with a non-physiologic etiology such as conversion disorder or secondary gain. No findings on spine MRI studies to suggest a myelopathic process. The mildly dilated central canal appears most likely to be incidenta    Recommendations: # PT/OT #  CIR    02/02/2018, 9:20 AM

## 2018-02-02 NOTE — Progress Notes (Signed)
Pulmonary Individual Treatment Plan  Patient Details  Name: Tina Calderon MRN: [id number] Date of Birth: [birth year] Referring Provider:     Pulmonary Rehab from 12/22/2017 in [facility] Cardiac and Pulmonary Rehab  Referring Provider  Court Joy MD      Initial Encounter Date:    Pulmonary Rehab from 12/22/2017 in [facility] Cardiac and Pulmonary Rehab  Date  12/22/17      Visit Diagnosis: Interstitial lung disease ([place])  Patient's Home Medications on Admission: No current facility-administered medications for this visit.  No current outpatient medications on file.  Facility-Administered Medications Ordered in Other Visits:  .  acetaminophen (TYLENOL) tablet 650 mg, 650 mg, Oral, Q6H PRN, 650 mg at 02/01/18 0803 **OR** acetaminophen (TYLENOL) suppository 650 mg, 650 mg, Rectal, Q6H PRN, Ivor Costa, MD .  albuterol (PROVENTIL) (2.5 MG/3ML) 0.083% nebulizer solution 2.5 mg, 2.5 mg, Nebulization, Q4H PRN, Ivor Costa, MD .  aspirin chewable tablet 81 mg, 81 mg, Oral, Daily, Ivor Costa, MD, 81 mg at 02/01/18 0851 .  calcium-vitamin D (OSCAL WITH D) 500-200 MG-UNIT per tablet 1 tablet, 1 tablet, Oral, q morning - 10a, Ivor Costa, MD, 1 tablet at 02/01/18 [id number] .  dextromethorphan-guaiFENesin ([place] DM) 30-600 MG per 12 hr tablet 1 tablet, 1 tablet, Oral, BID PRN, Ivor Costa, MD .  enoxaparin (LOVENOX) injection 40 mg, 40 mg, Subcutaneous, Q24H, Ivor Costa, MD, 40 mg at 02/02/18 0747 .  hydrALAZINE (APRESOLINE) injection 5 mg, 5 mg, Intravenous, Q2H PRN, Ivor Costa, MD .  levothyroxine (SYNTHROID, LEVOTHROID) tablet 88 mcg, 88 mcg, Oral, QAC breakfast, Ivor Costa, MD, 88 mcg at 02/02/18 0747 .  loratadine (CLARITIN) tablet 10 mg, 10 mg, Oral, QPM, Ivor Costa, MD, 10 mg at 02/01/18 1739 .  metoprolol succinate (TOPROL-XL) 24 hr tablet 12.5 mg, 12.5 mg, Oral, Daily, Ivor Costa, MD, 12.5 mg at 02/01/18 0850 .  ondansetron (ZOFRAN) tablet 4 mg, 4 mg, Oral, Q6H PRN **OR** ondansetron (ZOFRAN) injection 4  mg, 4 mg, Intravenous, Q6H PRN, Ivor Costa, MD .  pantoprazole (PROTONIX) EC tablet 40 mg, 40 mg, Oral, Daily, Ivor Costa, MD, 40 mg at 02/01/18 0850 .  Pirfenidone (Esbriet) 801 mg per 3 capsules (patient's own supply), 801 mg, Oral, TID WC, Ivor Costa, MD, 801 mg at 02/02/18 0742 .  rosuvastatin (CRESTOR) tablet 5 mg, 5 mg, Oral, QPM, Ivor Costa, MD, 5 mg at 02/01/18 1739 .  senna-docusate (Senokot-S) tablet 1 tablet, 1 tablet, Oral, QHS PRN, Ivor Costa, MD .  zolpidem (AMBIEN) tablet 5 mg, 5 mg, Oral, QHS PRN, Ivor Costa, MD  Past Medical History: Past Medical History:  Diagnosis Date  . Anginal pain ([place])   . Cancer ([place])   . CHF (congestive heart failure) ([place]) 01/22/2012  . Chronic fatigue 07/15/2014  . GERD (gastroesophageal reflux disease) 05/12/2017  . Hypertension   . Hypothyroidism   . ILD (interstitial lung disease) ([place]) 02/04/2012  . IPF (idiopathic pulmonary fibrosis) ([place]) 2013  . Mild intermittent asthma 01/29/2016  . Pelvic relaxation   . Prolapse of bladder   . Shortness of breath   . Thyroid disease     Tobacco Use: Social History   Tobacco Use  Smoking Status Never Smoker  Smokeless Tobacco Never Used    Labs: Recent Review Flowsheet Data    Labs for ITP Cardiac and Pulmonary Rehab Latest Ref Rng & Units 01/21/2012 12/06/2016 12/16/2016 06/18/2017 12/15/2017   Cholestrol 0 - 200 mg/dL 192 237(H) - 229(H) 239(H)   LDLCALC 0 - 99  mg/dL 98 154(H) - 138(H) 149(H)   HDL >39.00 mg/dL 46 51.90 - 51.70 55.10   Trlycerides 0.0 - 149.0 mg/dL 239(H) 152.0(H) - 193.0(H) 175.0(H)   Hemoglobin A1c 4.6 - 6.5 % - - 6.3 6.4 6.4       Pulmonary Assessment Scores: Pulmonary Assessment Scores    Row Name 12/22/17 1430         ADL UCSD   ADL Phase  Entry     SOB Score total  61     Rest  0     Walk  3     Stairs  4     Bath  3     Dress  2     Shop  3       CAT Score   CAT Score  14       mMRC Score   mMRC Score  3        Pulmonary Function  Assessment: Pulmonary Function Assessment - 12/22/17 1451      Breath   Bilateral Breath Sounds  Clear    Shortness of Breath  Limiting activity;Yes       Exercise Target Goals: Exercise Program Goal: Individual exercise prescription set using results from initial 6 min walk test and THRR while considering  patient's activity barriers and safety.   Exercise Prescription Goal: Initial exercise prescription builds to 30-45 minutes a day of aerobic activity, 2-3 days per week.  Home exercise guidelines will be given to patient during program as part of exercise prescription that the participant will acknowledge.  Activity Barriers & Risk Stratification: Activity Barriers & Cardiac Risk Stratification - 12/22/17 1529      Activity Barriers & Cardiac Risk Stratification   Activity Barriers  Deconditioning;Muscular Weakness;Shortness of Breath       6 Minute Walk: 6 Minute Walk    Row Name 12/22/17 1527         6 Minute Walk   Phase  Initial     Distance  1467 feet     Walk Time  6 minutes     # of Rest Breaks  0     MPH  2.78     METS  3.48     RPE  13     Perceived Dyspnea   2     VO2 Peak  12.17     Symptoms  Yes (comment)     Comments  SOB, leg fatigue in thighs (burning)     Resting HR  95 bpm     Resting BP  126/64     Resting Oxygen Saturation   96 %     Exercise Oxygen Saturation  during 6 min walk  87 %     Max Ex. HR  148 bpm     Max Ex. BP  144/66     2 Minute Post BP  124/68       Interval HR   1 Minute HR  126     2 Minute HR  139     3 Minute HR  145     4 Minute HR  148     5 Minute HR  144     6 Minute HR  143     2 Minute Post HR  109     Interval Heart Rate?  Yes       Interval Oxygen   Interval Oxygen?  Yes     Baseline Oxygen Saturation %  96 %  1 Minute Oxygen Saturation %  91 %     1 Minute Liters of Oxygen  0 L Room Air     2 Minute Oxygen Saturation %  89 %     2 Minute Liters of Oxygen  0 L     3 Minute Oxygen Saturation %  88 %      3 Minute Liters of Oxygen  0 L     4 Minute Oxygen Saturation %  87 %     4 Minute Liters of Oxygen  0 L     5 Minute Oxygen Saturation %  87 %     5 Minute Liters of Oxygen  0 L     6 Minute Oxygen Saturation %  87 %     6 Minute Liters of Oxygen  0 L     2 Minute Post Oxygen Saturation %  95 %     2 Minute Post Liters of Oxygen  0 L       Oxygen Initial Assessment: Oxygen Initial Assessment - 12/22/17 1455      Home Oxygen   Home Oxygen Device  None    Sleep Oxygen Prescription  None    Home Exercise Oxygen Prescription  None    Home at Rest Exercise Oxygen Prescription  None      Initial 6 min Walk   Oxygen Used  None      Program Oxygen Prescription   Program Oxygen Prescription  None      Intervention   Short Term Goals  To learn and understand importance of monitoring SPO2 with pulse oximeter and demonstrate accurate use of the pulse oximeter.;To learn and understand importance of maintaining oxygen saturations>88%;To learn and demonstrate proper pursed lip breathing techniques or other breathing techniques.;To learn and demonstrate proper use of respiratory medications    Long  Term Goals  Verbalizes importance of monitoring SPO2 with pulse oximeter and return demonstration;Maintenance of O2 saturations>88%;Exhibits proper breathing techniques, such as pursed lip breathing or other method taught during program session;Compliance with respiratory medication;Demonstrates proper use of MDI's       Oxygen Re-Evaluation: Oxygen Re-Evaluation    Row Name 12/29/17 1046 01/26/18 1611           Program Oxygen Prescription   Program Oxygen Prescription  None  None        Home Oxygen   Home Oxygen Device  None  None      Sleep Oxygen Prescription  None  None      Home Exercise Oxygen Prescription  None  None      Home at Rest Exercise Oxygen Prescription  None  None        Goals/Expected Outcomes   Short Term Goals  To learn and understand importance of monitoring  SPO2 with pulse oximeter and demonstrate accurate use of the pulse oximeter.;To learn and understand importance of maintaining oxygen saturations>88%;To learn and demonstrate proper pursed lip breathing techniques or other breathing techniques.;To learn and demonstrate proper use of respiratory medications  To learn and understand importance of monitoring SPO2 with pulse oximeter and demonstrate accurate use of the pulse oximeter.;To learn and understand importance of maintaining oxygen saturations>88%;To learn and demonstrate proper pursed lip breathing techniques or other breathing techniques.      Long  Term Goals  Verbalizes importance of monitoring SPO2 with pulse oximeter and return demonstration;Maintenance of O2 saturations>88%;Exhibits proper breathing techniques, such as pursed lip breathing or other method taught during program session;Compliance  with respiratory medication;Demonstrates proper use of MDI's  Verbalizes importance of monitoring SPO2 with pulse oximeter and return demonstration;Maintenance of O2 saturations>88%;Exhibits proper breathing techniques, such as pursed lip breathing or other method taught during program session;Compliance with respiratory medication      Comments  Reviewed PLB technique with pt.  Talked about how it work and it's important to maintaining his exercise saturations.    Patient has been doing well in class with her PLB. She uses PLB at home and when she gets short of breath. She has been monitoring her oxygen at home but not on exertion. Informed patient to check on her oxygen after she does strenuous activities.      Goals/Expected Outcomes  Short: Become more profiecient at using PLB.   Long: Become independent at using PLB.  Short: monitor oxygen at home with exertion. Long: maintain oxygen saturations above 88 percent independently.         Oxygen Discharge (Final Oxygen Re-Evaluation): Oxygen Re-Evaluation - 01/26/18 1611      Program Oxygen  Prescription   Program Oxygen Prescription  None      Home Oxygen   Home Oxygen Device  None    Sleep Oxygen Prescription  None    Home Exercise Oxygen Prescription  None    Home at Rest Exercise Oxygen Prescription  None      Goals/Expected Outcomes   Short Term Goals  To learn and understand importance of monitoring SPO2 with pulse oximeter and demonstrate accurate use of the pulse oximeter.;To learn and understand importance of maintaining oxygen saturations>88%;To learn and demonstrate proper pursed lip breathing techniques or other breathing techniques.    Long  Term Goals  Verbalizes importance of monitoring SPO2 with pulse oximeter and return demonstration;Maintenance of O2 saturations>88%;Exhibits proper breathing techniques, such as pursed lip breathing or other method taught during program session;Compliance with respiratory medication    Comments  Patient has been doing well in class with her PLB. She uses PLB at home and when she gets short of breath. She has been monitoring her oxygen at home but not on exertion. Informed patient to check on her oxygen after she does strenuous activities.    Goals/Expected Outcomes  Short: monitor oxygen at home with exertion. Long: maintain oxygen saturations above 88 percent independently.       Initial Exercise Prescription: Initial Exercise Prescription - 12/22/17 1600      Date of Initial Exercise RX and Referring Provider   Date  12/22/17    Referring Provider  Court Joy MD      Treadmill   MPH  2.5    Grade  0.5    Minutes  15    METs  3.09      NuStep   Level  2    SPM  80    Minutes  15    METs  2.5      REL-XR   Level  1    Speed  50    Minutes  15    METs  2.5      Prescription Details   Frequency (times per week)  3    Duration  Progress to 45 minutes of aerobic exercise without signs/symptoms of physical distress      Intensity   THRR 40-80% of Max Heartrate  114-133    Ratings of Perceived Exertion   11-13    Perceived Dyspnea  0-4      Progression   Progression  Continue to progress  workloads to maintain intensity without signs/symptoms of physical distress.      Resistance Training   Training Prescription  Yes    Weight  3 lbs    Reps  10-15       Perform Capillary Blood Glucose checks as needed.  Exercise Prescription Changes: Exercise Prescription Changes    Row Name 12/22/17 1500 01/08/18 1000 01/09/18 1200 01/22/18 1100       Response to Exercise   Blood Pressure (Admit)  126/64  122/66  122/66  112/62    Blood Pressure (Exercise)  144/66  138/68  138/68  -    Blood Pressure (Exit)  124/68  126/74  126/74  110/70    Heart Rate (Admit)  95 bpm  91 bpm  91 bpm  96 bpm    Heart Rate (Exercise)  148 bpm  137 bpm  137 bpm  124 bpm    Heart Rate (Exit)  109 bpm  106 bpm  106 bpm  105 bpm    Oxygen Saturation (Admit)  96 %  96 %  96 %  95 %    Oxygen Saturation (Exercise)  87 %  87 %  87 %  88 %    Oxygen Saturation (Exit)  95 %  97 %  97 %  96 %    Rating of Perceived Exertion (Exercise)  _0 Perceived Dyspnea (Exercise)  _1 Symptoms  SOB, leg fatigue in thighs (burning)  -  -  -    Comments  walk test results  -  -  -    Duration  -  Progress to 45 minutes of aerobic exercise without signs/symptoms of physical distress  Progress to 45 minutes of aerobic exercise without signs/symptoms of physical distress  Continue with 45 min of aerobic exercise without signs/symptoms of physical distress.    Intensity  -  THRR unchanged  THRR unchanged  THRR unchanged      Progression   Progression  -  Continue to progress workloads to maintain intensity without signs/symptoms of physical distress.  Continue to progress workloads to maintain intensity without signs/symptoms of physical distress.  Continue to progress workloads to maintain intensity without signs/symptoms of physical distress.    Average METs  -  2.65  2.65  2.86      Resistance Training    Training Prescription  -  Yes  Yes  Yes    Weight  -  3 lb  3 lb  3 lb    Reps  -  10-15  10-15  10-15      Interval Training   Interval Training  -  -  -  No      Treadmill   MPH  -  2.5  2.5  2.5    Grade  -  0.5  0.5  0.5    Minutes  -  _2 METs  -  3.09  3.09  3.09      NuStep   Level  -  _3 SPM  -  80  80  80    Minutes  -  _4 METs  -  2.2  2.2  1.9      REL-XR   Level  -  -  -  1  Speed  -  -  -  50    Minutes  -  -  -  15    METs  -  -  -  3.6      Home Exercise Plan   Plans to continue exercise at  -  -  [employer]    Frequency  -  -  Add 1 additional day to program exercise sessions.  Add 1 additional day to program exercise sessions.    Initial Home Exercises Provided  -  -  01/09/18  01/09/18       Exercise Comments: Exercise Comments    Row Name 12/29/17 1048 01/09/18 1208         Exercise Comments   First full day of exercise!  Patient was oriented to gym and equipment including functions, settings, policies, and procedures.  Patient's individual exercise prescription and treatment plan were reviewed.  All starting workloads were established based on the results of the 6 minute walk test done at initial orientation visit.  The plan for exercise progression was also introduced and progression will be customized based on patient's performance and goals.  Reviewed home exercise with pt today.  Pt plans to walk and consider [place] for exercise.  Reviewed THR, pulse, RPE, sign and symptoms, NTG use, and when to call 911 or MD.  Also discussed weather considerations and indoor options.  Pt voiced understanding.         Exercise Goals and Review: Exercise Goals    Row Name 12/22/17 1530             Exercise Goals   Increase Physical Activity  Yes       Intervention  Provide advice, education, support and counseling about physical activity/exercise needs.;Develop an individualized exercise prescription for  aerobic and resistive training based on initial evaluation findings, risk stratification, comorbidities and participant's personal goals.       Expected Outcomes  Short Term: Attend rehab on a regular basis to increase amount of physical activity.;Long Term: Add in home exercise to make exercise part of routine and to increase amount of physical activity.;Long Term: Exercising regularly at least 3-5 days a week.       Increase Strength and Stamina  Yes       Intervention  Provide advice, education, support and counseling about physical activity/exercise needs.;Develop an individualized exercise prescription for aerobic and resistive training based on initial evaluation findings, risk stratification, comorbidities and participant's personal goals.       Expected Outcomes  Short Term: Perform resistance training exercises routinely during rehab and add in resistance training at home;Short Term: Increase workloads from initial exercise prescription for resistance, speed, and METs.;Long Term: Improve cardiorespiratory fitness, muscular endurance and strength as measured by increased METs and functional capacity (6MWT)       Able to understand and use rate of perceived exertion (RPE) scale  Yes       Intervention  Provide education and explanation on how to use RPE scale       Expected Outcomes  Short Term: Able to use RPE daily in rehab to express subjective intensity level;Long Term:  Able to use RPE to guide intensity level when exercising independently       Able to understand and use Dyspnea scale  Yes       Intervention  Provide education and explanation on how to use Dyspnea scale       Expected Outcomes  Short  Term: Able to use Dyspnea scale daily in rehab to express subjective sense of shortness of breath during exertion;Long Term: Able to use Dyspnea scale to guide intensity level when exercising independently       Knowledge and understanding of Target Heart Rate Range (THRR)  Yes        Intervention  Provide education and explanation of THRR including how the numbers were predicted and where they are located for reference       Expected Outcomes  Short Term: Able to state/look up THRR;Short Term: Able to use daily as guideline for intensity in rehab;Long Term: Able to use THRR to govern intensity when exercising independently       Able to check pulse independently  Yes       Intervention  Provide education and demonstration on how to check pulse in carotid and radial arteries.;Review the importance of being able to check your own pulse for safety during independent exercise       Expected Outcomes  Short Term: Able to explain why pulse checking is important during independent exercise;Long Term: Able to check pulse independently and accurately       Understanding of Exercise Prescription  Yes       Intervention  Provide education, explanation, and written materials on patient's individual exercise prescription       Expected Outcomes  Long Term: Able to explain home exercise prescription to exercise independently;Short Term: Able to explain program exercise prescription          Exercise Goals Re-Evaluation : Exercise Goals Re-Evaluation    Row Name 12/29/17 1044 01/08/18 1042 01/09/18 1208 01/22/18 1127       Exercise Goal Re-Evaluation   Exercise Goals Review  Increase Physical Activity;Increase Strength and Stamina;Able to understand and use rate of perceived exertion (RPE) scale;Able to check pulse independently;Knowledge and understanding of Target Heart Rate Range (THRR);Able to understand and use Dyspnea scale;Understanding of Exercise Prescription  Increase Physical Activity;Able to understand and use rate of perceived exertion (RPE) scale;Increase Strength and Stamina;Able to understand and use Dyspnea scale  Increase Physical Activity;Increase Strength and Stamina;Able to understand and use rate of perceived exertion (RPE) scale;Able to understand and use Dyspnea  scale;Knowledge and understanding of Target Heart Rate Range (THRR);Able to check pulse independently;Understanding of Exercise Prescription  Increase Physical Activity;Increase Strength and Stamina;Able to understand and use rate of perceived exertion (RPE) scale;Able to understand and use Dyspnea scale    Comments  Reviewed RPE scale, THR and program prescription with pt today.  Pt voiced understanding and was given a copy of goals to take home.   Elta is tolerating exercise well.  She seems to enjoy participating with the group.  Reviewed home exercise with pt today.  Pt plans to walk and consider [place] for exercise.  Reviewed THR, pulse, RPE, sign and symptoms, NTG use, and when to call 911 or MD.  Also discussed weather considerations and indoor options.  Pt voiced understanding.  Allissa has progressed well with exercise and has increased MET level.  Staff will monitor progress.  Staff discussed with Izora Gala exercising on her own as she will miss some days due to other Dr appointments.    Expected Outcomes  Short: Use RPE daily to regulate intensity. Long: Follow program prescription in THR.  Short - continue to attend 3 days per week Long - increase MET level  Short - Tasmia will walk with her husband once per week outside [facility] classes Long - Izora Gala  exercise on her own - staff discussed [employer] or [employer] - continue to attend consistently Long - improve MET level       Discharge Exercise Prescription (Final Exercise Prescription Changes): Exercise Prescription Changes - 01/22/18 1100      Response to Exercise   Blood Pressure (Admit)  112/62    Blood Pressure (Exit)  110/70    Heart Rate (Admit)  96 bpm    Heart Rate (Exercise)  124 bpm    Heart Rate (Exit)  105 bpm    Oxygen Saturation (Admit)  95 %    Oxygen Saturation (Exercise)  88 %    Oxygen Saturation (Exit)  96 %    Rating of Perceived Exertion (Exercise)  12    Perceived Dyspnea (Exercise)  3    Duration  Continue with  45 min of aerobic exercise without signs/symptoms of physical distress.    Intensity  THRR unchanged      Progression   Progression  Continue to progress workloads to maintain intensity without signs/symptoms of physical distress.    Average METs  2.86      Resistance Training   Training Prescription  Yes    Weight  3 lb    Reps  10-15      Interval Training   Interval Training  No      Treadmill   MPH  2.5    Grade  0.5    Minutes  15    METs  3.09      NuStep   Level  2    SPM  80    Minutes  15    METs  1.9      REL-XR   Level  1    Speed  50    Minutes  15    METs  3.6      Home Exercise Plan   Plans to continue exercise at  [employer]    Frequency  Add 1 additional day to program exercise sessions.    Initial Home Exercises Provided  01/09/18       Nutrition:  Target Goals: Understanding of nutrition guidelines, daily intake of sodium <1543m, cholesterol <2047m calories 30% from fat and 7% or less from saturated fats, daily to have 5 or more servings of fruits and vegetables.  Biometrics: Pre Biometrics - 12/22/17 1531      Pre Biometrics   Height  5' 2.1" (1.577 m)    Weight  127 lb 11.2 oz (57.9 kg)    Waist Circumference  30 inches    Hip Circumference  35 inches    Waist to Hip Ratio  0.86 %    BMI (Calculated)  23.29    Single Leg Stand  30 seconds        Nutrition Therapy Plan and Nutrition Goals: Nutrition Therapy & Goals - 01/26/18 1037      Nutrition Therapy   RD appointment deferred  Yes       Nutrition Assessments: Nutrition Assessments - 12/22/17 1434      MEDFICTS Scores   Pre Score  21       Nutrition Goals Re-Evaluation:   Nutrition Goals Discharge (Final Nutrition Goals Re-Evaluation):   Psychosocial: Target Goals: Acknowledge presence or absence of significant depression and/or stress, maximize coping skills, provide positive support system. Participant is able to verbalize types and ability to use techniques  and skills needed for reducing stress and depression.   Initial Review & Psychosocial  Screening: Initial Psych Review & Screening - 12/22/17 1446      Initial Review   Current issues with  Current Sleep Concerns;Current Stress Concerns    Source of Stress Concerns  Chronic Illness;Unable to perform yard/household activities    Comments  Her house work is hard to due with her shortness of breath. Her ILD is making it stressful on her. 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      Family Dynamics   Good Support System?  Yes    Comments  She can look to her sister and daughter. She does a therapy group for pulmonary fibrosis.       Barriers   Psychosocial barriers to participate in program  The patient should benefit from training in stress management and relaxation.      Screening Interventions   Interventions  To provide support and resources with identified psychosocial needs;Encouraged to exercise;Program counselor consult;Provide feedback about the scores to participant    Expected Outcomes  Short Term goal: Utilizing psychosocial counselor, staff and physician to assist with identification of specific Stressors or current issues interfering with healing process. Setting desired goal for each stressor or current issue identified.;Long Term Goal: Stressors or current issues are controlled or eliminated.;Short Term goal: Identification and review with participant of any Quality of Life or Depression concerns found by scoring the questionnaire.;Long Term goal: The participant improves quality of Life and PHQ9 Scores as seen by post scores and/or verbalization of changes       Quality of Life Scores:  Scores of 19 and below usually indicate a poorer quality of life in these areas.  A difference of  2-3 points is a clinically meaningful difference.  A difference of 2-3 points in the total score of the Quality of Life Index has been associated with significant improvement in overall  quality of life, self-image, physical symptoms, and general health in studies assessing change in quality of life.  PHQ-9: Recent Review Flowsheet Data    Depression screen [facility] 2/9 12/22/2017 12/15/2017 12/06/2016   Decreased Interest 1 0 0   Down, Depressed, Hopeless 1 0 0   PHQ - 2 Score 2 0 0   Altered sleeping 2 0 -   Tired, decreased energy 2 0 -   Change in appetite 2 0 -   Feeling bad or failure about yourself  1 0 -   Trouble concentrating 1 0 -   Moving slowly or fidgety/restless 0 0 -   Suicidal thoughts 0 0 -   PHQ-9 Score 10 0 -   Difficult doing work/chores Somewhat difficult Not difficult at all -     Interpretation of Total Score  Total Score Depression Severity:  1-4 = Minimal depression, 5-9 = Mild depression, 10-14 = Moderate depression, 15-19 = Moderately severe depression, 20-27 = Severe depression   Psychosocial Evaluation and Intervention: Psychosocial Evaluation - 12/29/17 1034      Psychosocial Evaluation & Interventions   Interventions  Encouraged to exercise with the program and follow exercise prescription    Comments  Counselor met with Ms. Elmer Izora Gala) today for initial psychosocial evaluation.  She is a 77 year old who has had Pulmonary Fibrosis 6 years and this is the 2nd time in this program during that period of time.  Arelyn has a very strong support system with a spouse of 28 years; (2) adult children who live locally; extended family and active involvement in her local church.  Myrical reports sleeping  well most of the time (occasionally has trouble falling asleep).  Her appetite is good.  Lilie denies a history of depression or anxiety or any current symptoms and states her mood is positive most of the time.  She has minimal stress in her life other than her health and that of her spouse - who has COPD and is confined to a wheelchair.  Walta has goals to increase her stamina and stability and would like to improve her breathing while in this program.  She  mentioned losing "a little weight" but this is not a primary goals for her.  Staff will follow with her.    Expected Outcomes  Short:  Annaleigh will exercise consistently for her health and stress.  She will meet with the dietician for any weight loss considerations she has.    Long:  Burnell will practice the principles and strategies taught while in this program in the future to manage her condition more positively.      Continue Psychosocial Services   Follow up required by staff       Psychosocial Re-Evaluation: Psychosocial Re-Evaluation    [place] Name 01/26/18 1614             Psychosocial Re-Evaluation   Current issues with  Current Stress Concerns       Comments  Patient states that everything is going well and she does not have much stress in her life. Her husband has some health issues and is the only thing that stresses her out. Her breathing has been getting a little better since the start of the program.       Expected Outcomes  Short: Attend [facility] stress management education to decrease stress. Long: Maintain exercise Post [facility] to keep stress at a minimum.       Interventions  Encouraged to attend Pulmonary Rehabilitation for the exercise       Continue Psychosocial Services   Follow up required by staff          Psychosocial Discharge (Final Psychosocial Re-Evaluation): Psychosocial Re-Evaluation - 01/26/18 1614      Psychosocial Re-Evaluation   Current issues with  Current Stress Concerns    Comments  Patient states that everything is going well and she does not have much stress in her life. Her husband has some health issues and is the only thing that stresses her out. Her breathing has been getting a little better since the start of the program.    Expected Outcomes  Short: Attend [facility] stress management education to decrease stress. Long: Maintain exercise Post [facility] to keep stress at a minimum.    Interventions  Encouraged to attend Pulmonary Rehabilitation  for the exercise    Continue Psychosocial Services   Follow up required by staff       Education: Education Goals: Education classes will be provided on a weekly basis, covering required topics. Participant will state understanding/return demonstration of topics presented.  Learning Barriers/Preferences: Learning Barriers/Preferences - 12/22/17 1452      Learning Barriers/Preferences   Learning Barriers  Sight   wears glasses   Learning Preferences  None       Education Topics:  Initial Evaluation Education: - Verbal, written and demonstration of respiratory meds, oximetry and breathing techniques. Instruction on use of nebulizers and MDIs and importance of monitoring MDI activations.   Pulmonary Rehab from 01/28/2018 in [facility] Cardiac and Pulmonary Rehab  Date  12/22/17  Educator  [facility]  Instruction Review Code  1- Verbalizes Understanding  General Nutrition Guidelines/Fats and Fiber: -Group instruction provided by verbal, written material, models and posters to present the general guidelines for heart healthy nutrition. Gives an explanation and review of dietary fats and fiber.   Controlling Sodium/Reading Food Labels: -Group verbal and written material supporting the discussion of sodium use in heart healthy nutrition. Review and explanation with models, verbal and written materials for utilization of the food label.   Pulmonary Rehab from 01/28/2018 in [facility] Cardiac and Pulmonary Rehab  Date  12/29/17  Educator  [facility]  Instruction Review Code  1- Verbalizes Understanding      Exercise Physiology & General Exercise Guidelines: - Group verbal and written instruction with models to review the exercise physiology of the cardiovascular system and associated critical values. Provides general exercise guidelines with specific guidelines to those with heart or lung disease.    Aerobic Exercise & Resistance Training: - Gives group verbal and written instruction on the various  components of exercise. Focuses on aerobic and resistive training programs and the benefits of this training and how to safely progress through these programs.   Flexibility, Balance, Mind/Body Relaxation: Provides group verbal/written instruction on the benefits of flexibility and balance training, including mind/body exercise modes such as yoga, pilates and tai chi.  Demonstration and skill practice provided.   Pulmonary Rehab from 01/28/2018 in [facility] Cardiac and Pulmonary Rehab  Date  01/09/18  Educator  AS  Instruction Review Code  1- Verbalizes Understanding      Stress and Anxiety: - Provides group verbal and written instruction about the health risks of elevated stress and causes of high stress.  Discuss the correlation between heart/lung disease and anxiety and treatment options. Review healthy ways to manage with stress and anxiety.   Pulmonary Rehab from 01/28/2018 in [facility] Cardiac and Pulmonary Rehab  Date  01/14/18  Educator  [facility]  Instruction Review Code  1- Verbalizes Understanding      Depression: - Provides group verbal and written instruction on the correlation between heart/lung disease and depressed mood, treatment options, and the stigmas associated with seeking treatment.   Exercise & Equipment Safety: - Individual verbal instruction and demonstration of equipment use and safety with use of the equipment.   Pulmonary Rehab from 01/28/2018 in [facility] Cardiac and Pulmonary Rehab  Date  12/22/17  Educator  [facility]  Instruction Review Code  1- Verbalizes Understanding      Infection Prevention: - Provides verbal and written material to individual with discussion of infection control including proper hand washing and proper equipment cleaning during exercise session.   Pulmonary Rehab from 01/28/2018 in [facility] Cardiac and Pulmonary Rehab  Date  12/22/17  Educator  [facility]  Instruction Review Code  1- Verbalizes Understanding      Falls Prevention: - Provides verbal and written  material to individual with discussion of falls prevention and safety.   Pulmonary Rehab from 01/28/2018 in [facility] Cardiac and Pulmonary Rehab  Date  12/22/17  Educator  [facility]  Instruction Review Code  1- Verbalizes Understanding      Diabetes: - Individual verbal and written instruction to review signs/symptoms of diabetes, desired ranges of glucose level fasting, after meals and with exercise. Advice that pre and post exercise glucose checks will be done for 3 sessions at entry of program.   Chronic Lung Diseases: - Group verbal and written instruction to review updates, respiratory medications, advancements in procedures and treatments. Discuss use of supplemental oxygen including available portable oxygen systems, continuous and intermittent flow rates, concentrators,  personal use and safety guidelines. Review proper use of inhaler and spacers. Provide informative websites for self-education.    Energy Conservation: - Provide group verbal and written instruction for methods to conserve energy, plan and organize activities. Instruct on pacing techniques, use of adaptive equipment and posture/positioning to relieve shortness of breath.   Pulmonary Rehab from 01/28/2018 in [facility] Cardiac and Pulmonary Rehab  Date  01/28/18  Educator  [facility]  Instruction Review Code  5- Refused Teaching      Triggers and Exacerbations: - Group verbal and written instruction to review types of environmental triggers and ways to prevent exacerbations. Discuss weather changes, air quality and the benefits of nasal washing. Review warning signs and symptoms to help prevent infections. Discuss techniques for effective airway clearance, coughing, and vibrations.   AED/CPR: - Group verbal and written instruction with the use of models to demonstrate the basic use of the AED with the basic ABC's of resuscitation.   Anatomy and Physiology of the Lungs: - Group verbal and written instruction with the use of models to  provide basic lung anatomy and physiology related to function, structure and complications of lung disease.   Anatomy & Physiology of the Heart: - Group verbal and written instruction and models provide basic cardiac anatomy and physiology, with the coronary electrical and arterial systems. Review of Valvular disease and Heart Failure   Pulmonary Rehab from 01/28/2018 in [facility] Cardiac and Pulmonary Rehab  Date  01/07/18  Educator  [facility]  Instruction Review Code  1- Verbalizes Understanding      Cardiac Medications: - Group verbal and written instruction to review commonly prescribed medications for heart disease. Reviews the medication, class of the drug, and side effects.   Know Your Numbers and Risk Factors: -Group verbal and written instruction about important numbers in your health.  Discussion of what are risk factors and how they play a role in the disease process.  Review of Cholesterol, Blood Pressure, Diabetes, and BMI and the role they play in your overall health.   Sleep Hygiene: -Provides group verbal and written instruction about how sleep can affect your health.  Define sleep hygiene, discuss sleep cycles and impact of sleep habits. Review good sleep hygiene tips.    Other: -Provides group and verbal instruction on various topics (see comments)    Knowledge Questionnaire Score: Knowledge Questionnaire Score - 12/22/17 1435      Knowledge Questionnaire Score   Pre Score  17/18   reviewed with patient       Core Components/Risk Factors/Patient Goals at Admission: Personal Goals and Risk Factors at Admission - 12/22/17 1458      Core Components/Risk Factors/Patient Goals on Admission    Weight Management  Yes;Weight Maintenance    Intervention  Weight Management: Develop a combined nutrition and exercise program designed to reach desired caloric intake, while maintaining appropriate intake of nutrient and fiber, sodium and fats, and appropriate energy expenditure  required for the weight goal.;Weight Management: Provide education and appropriate resources to help participant work on and attain dietary goals.;Weight Management/Obesity: Establish reasonable short term and long term weight goals.    Admit Weight  127 lb 11.2 oz (57.9 kg)    Goal Weight: Short Term  127 lb (57.6 kg)    Goal Weight: Long Term  127 lb (57.6 kg)    Expected Outcomes  Short Term: Continue to assess and modify interventions until short term weight is achieved;Long Term: Adherence to nutrition and physical activity/exercise program aimed toward  attainment of established weight goal;Weight Maintenance: Understanding of the daily nutrition guidelines, which includes 25-35% calories from fat, 7% or less cal from saturated fats, less than 220m cholesterol, less than 1.5gm of sodium, & 5 or more servings of fruits and vegetables daily;Understanding recommendations for meals to include 15-35% energy as protein, 25-35% energy from fat, 35-60% energy from carbohydrates, less than 2033mof dietary cholesterol, 20-35 gm of total fiber daily;Understanding of distribution of calorie intake throughout the day with the consumption of 4-5 meals/snacks    Improve shortness of breath with ADL's  Yes    Intervention  Provide education, individualized exercise plan and daily activity instruction to help decrease symptoms of SOB with activities of daily living.    Expected Outcomes  Short Term: Improve cardiorespiratory fitness to achieve a reduction of symptoms when performing ADLs;Long Term: Be able to perform more ADLs without symptoms or delay the onset of symptoms    Hypertension  Yes    Intervention  Provide education on lifestyle modifcations including regular physical activity/exercise, weight management, moderate sodium restriction and increased consumption of fresh fruit, vegetables, and low fat dairy, alcohol moderation, and smoking cessation.;Monitor prescription use compliance.    Expected Outcomes   Short Term: Continued assessment and intervention until BP is < 140/9018mG in hypertensive participants. < 130/58m15m in hypertensive participants with diabetes, heart failure or chronic kidney disease.;Long Term: Maintenance of blood pressure at goal levels.       Core Components/Risk Factors/Patient Goals Review:  Goals and Risk Factor Review    Row Name 01/26/18 1617             Core Components/Risk Factors/Patient Goals Review   Personal Goals Review  Weight Management/Obesity;Hypertension;Improve shortness of breath with ADL's       Review  Patients heart rate has been high and is wearing holter monitor today. She feels like she gets short of breath sometimes. Her ADL's have got a little better at home. Her doctor said she should take it easy while exercising to keep her heart rate down.       Expected Outcomes  Short: learn what is going on with her heart. Long: monitor heart rate independently.          Core Components/Risk Factors/Patient Goals at Discharge (Final Review):  Goals and Risk Factor Review - 01/26/18 1617      Core Components/Risk Factors/Patient Goals Review   Personal Goals Review  Weight Management/Obesity;Hypertension;Improve shortness of breath with ADL's    Review  Patients heart rate has been high and is wearing holter monitor today. She feels like she gets short of breath sometimes. Her ADL's have got a little better at home. Her doctor said she should take it easy while exercising to keep her heart rate down.    Expected Outcomes  Short: learn what is going on with her heart. Long: monitor heart rate independently.       ITP Comments: ITP Comments    Row Name 12/22/17 1416 01/05/18 0854 01/12/18 1125 02/02/18 0830     ITP Comments  Medical Evaluation completed. Chart sent for review and changes to Dr. MarkEmily Filbertector of [facility] be [place] [facility] encounter 12/01/2017  30 day review completed. ITP sent to Dr. MarkEmily Filbertector of  [facility] with ITP unless changes are made by physician  NancMadelin Rear not complete her rehab session.  NancLeontynee to class today. Her heart rate was higher than usual, ranging from  115-120 bpm. She stated that she had not felt good for 2-3 days. She called her doctor's office and talked to the nurse. They advised her to go to the ED for evaluation. She was taken by staff to the ED.  30 day review completed. ITP sent to Dr. Emily Filbert Director of [facility]. Continue with ITP unless changes are made by physician       Comments: 30 day review

## 2018-02-02 NOTE — Progress Notes (Signed)
Pt very upset because she wet the bed due to the perwick not working.  Reapplied the external catheter and pt is calm and thankful..  Took meds without complications.  Pt very anxious to find out why her legs are giving out and why she cant walk well.

## 2018-02-02 NOTE — Progress Notes (Signed)
Inpatient Rehabilitation  Met with patient, spouse, and daughter at bedside to discuss post acute rehab needs.  See Dr. Serita Grit consult for full details; patient doesn't have a diagnosis appropriate CIR.  Notified that we are unable to offer a bed and that CSW would be following up for other rehab options.  Will sign off at this time.  Call if questions.    Carmelia Roller., CCC/SLP Admission Coordinator  [place]  Cell [phone]

## 2018-02-02 NOTE — Care Management Obs Status (Signed)
[facility] NOTIFICATION   Patient Details  Name: TEREKA THORLEY MRN: [id number] Date of Birth: 01-22-1941   Medicare Observation Status Notification Given:  Yes    Pollie Friar, RN 02/02/2018, 11:39 AM

## 2018-02-02 NOTE — Progress Notes (Signed)
PROGRESS NOTE    Tina Calderon  HAL:[id number] DOB: 09/12/1940 DOA: 01/30/2018 PCP: [name], NP  Brief Narrative: Tina Calderon is a 77 y.o. female with medical history significant of pulmonary Fibrosis, hyperlipidemia, asthma, GERD, dCHF,  ILD, IPF, hypothyroidism, who presents with bilateral leg weakness and falls.   Assessment & Plan:     Bilateral leg weakness/hyperreflexia and increased tone -worse for 3-4days prior to admission, with mild muscle pain, tremors and weakness -Neurology following, MRI thoracic and cervical spine completed, questionable mild T-spine spinal cord dilatation, per Neuro incidental finding and unlikely to cause any symptoms -NEuro suspects some functional component -I suspect that she could be having mild Myalgias and muscle weakness from the Crestor that she was started over 1 month ago (12/18/2017), stopped Crestor -Physical therapy consulting, CIR vs SNF recommended -B-12 level is unremarkable -RPR is negative, CK normal -MRI with post contrast images completed last pm without acute findings, Neuro doesn't think that  T6-T8 dilated central cord is responsible for any of her symptoms -CSW consulted for Rehab  Hyperlipidemia: -Crestor stopped  IPF (idiopathic pulmonary fibrosis) ([place]): -continue Esbriet, has been on this for 4+ years -prn albuterol nebs  HTN:  -Continue home medications: Metoprolol -IV hydralazine prn  GERD: -Protonix  Hypothyroidism: Last TSH was 0.7 on 01/19/2018 -Continue home Synthroid  Thyroid nodule: - MRI showed an incidental 2.5 cm LEFT thyroid nodule. -  Her TSH was normal, 0.7 on 01/19/2018 -Follow-up with PCP  History of palpitations, seeing cardiology -Was wearing an event monitor for the last 2 weeks -2-D echocardiogram-noted normal EF, wall motion, grade 1 diastolic dysfunction, dilated left atrium and right ventricle -follow-up with Dr. Al Pimple   DVT ppx: SQ Lovenox Code  Status: Full code Family Communication: Yes, patient's husband   at bed side Disposition Plan:  SNF when bedavailable  Consults : Neurology, Dr. Leonel Ramsay   Procedures:   Antimicrobials:    Subjective: -frustrated, continues to have leg weakness and stiffness  Objective: Vitals:   02/01/18 2336 02/02/18 0404 02/02/18 0818 02/02/18 1212  BP: (!) 165/82 (!) 159/86 138/70 (!) 160/97  Pulse: 66 74 100 78  Resp: _0 Temp: 98 F (36.7 C) 97.8 F (36.6 C) 97.7 F (36.5 C) 98 F (36.7 C)  TempSrc: Oral Oral Oral Oral  SpO2: 97% 98% 97% 95%  Weight:      Height:        Intake/Output Summary (Last 24 hours) at 02/02/2018 1240 Last data filed at 02/02/2018 0700 Gross per 24 hour  Intake 240 ml  Output 600 ml  Net -360 ml   Filed Weights   01/30/18 1533 01/30/18 1550 01/31/18 0606  Weight: 59.9 kg 59.9 kg 58.6 kg    Examination: Gen: Awake, Alert, Oriented X 3,  HEENT: PERRLA, Neck supple, no JVD Lungs: Good air movement bilaterally, CTAB CVS: RRR,No Gallops,Rubs or new Murmurs Abd: soft, Non tender, non distended, BS present Extremities: No Cyanosis, Clubbing or edema Skin: no new rashes Central nervous system: Alert and oriented, cranial nerves intact, upper extremity is 5 out of 5 strength, lower extremity : has full flexion of her hip but unable to lift the whole leg up when these extended, sensations intact, very brisk deep tendon reflexes Psychiatry: Judgement and insight appear normal. Mood & affect appropriate.     Data Reviewed:   CBC: Recent Labs  Lab 01/30/18 1604 01/31/18 0558  WBC 7.5 7.1  HGB 10.9* 10.6*  HCT 35.7*  33.4*  MCV 81.7 81.1  PLT 257 272   Basic Metabolic Panel: Recent Labs  Lab 01/29/18 0759 01/30/18 1604 01/31/18 0337 01/31/18 0558  NA 138 138  --  137  K 4.3 4.4  --  3.8  CL 102 104  --  101  CO2 29 26  --  28  GLUCOSE 95 109*  --  102*  BUN 12 9  --  7*  CREATININE 0.95 0.82  --  0.83  CALCIUM 9.4 9.1  --   9.0  MG  --   --  2.0  --    GFR: Estimated Creatinine Clearance: 44.2 mL/min (by C-G formula based on SCr of 0.83 mg/dL). Liver Function Tests: No results for input(s): AST, ALT, ALKPHOS, BILITOT, PROT, ALBUMIN in the last 168 hours. No results for input(s): LIPASE, AMYLASE in the last 168 hours. No results for input(s): AMMONIA in the last 168 hours. Coagulation Profile: No results for input(s): INR, PROTIME in the last 168 hours. Cardiac Enzymes: Recent Labs  Lab 01/31/18 0337  CKTOTAL 63   BNP (last 3 results) No results for input(s): PROBNP in the last 8760 hours. HbA1C: No results for input(s): HGBA1C in the last 72 hours. CBG: Recent Labs  Lab 01/30/18 1728  GLUCAP 85   Lipid Profile: No results for input(s): CHOL, HDL, LDLCALC, TRIG, CHOLHDL, LDLDIRECT in the last 72 hours. Thyroid Function Tests: No results for input(s): TSH, T4TOTAL, FREET4, T3FREE, THYROIDAB in the last 72 hours. Anemia Panel: Recent Labs    01/31/18 0558  VITAMINB12 477  FOLATE 35.3   Urine analysis:    Component Value Date/Time   COLORURINE STRAW (A) 01/30/2018 [place] 01/30/2018 1605   APPEARANCEUR Hazy 12/18/2013 1504   LABSPEC 1.005 01/30/2018 1605   LABSPEC 1.015 12/18/2013 1504   PHURINE 6.0 01/30/2018 1605   GLUCOSEU NEGATIVE 01/30/2018 1605   GLUCOSEU Negative 12/18/2013 1504   HGBUR NEGATIVE 01/30/2018 1605   BILIRUBINUR NEGATIVE 01/30/2018 1605   BILIRUBINUR Negative 12/18/2013 1504   KETONESUR NEGATIVE 01/30/2018 1605   PROTEINUR NEGATIVE 01/30/2018 1605   UROBILINOGEN negative 11/13/2011 0907   UROBILINOGEN 0.2 09/19/2009 0518   NITRITE NEGATIVE 01/30/2018 1605   LEUKOCYTESUR NEGATIVE 01/30/2018 1605   LEUKOCYTESUR 2+ 12/18/2013 1504   Sepsis Labs: _0 (procalcitonin:4,lacticidven:4)  )No results found for this or any previous visit (from the past 240 hour(s)).       Radiology Studies: Mr Cervical Spine W Contrast  Result Date:  02/02/2018 CLINICAL DATA:  77 y/o F; weakness in both legs for 3 weeks worsening today. Worsening left leg weakness. EXAM: MRI CERVICAL SPINE WITHOUT CONTRAST MRI THORACIC SPINE WITHOUT CONTRAST TECHNIQUE: Multiplanar and multiecho pulse sequences of the cervical and thoracic spine were obtained without intravenous contrast. COMPARISON:  01/31/2018 cervical and thoracic spine MRI. FINDINGS: MRI CERVICAL SPINE FINDINGS Alignment: Straightening of cervical lordosis. Grade 1 C3-4 anterolisthesis. Vertebrae: No abnormal enhancement. C5-C7 ACDF. Susceptibility artifact from fusion hardware partially obscures the vertebral bodies at those levels. Cord: No abnormal enhancement. Posterior Fossa, vertebral arteries, paraspinal tissues: Negative. The thyroid gland is excluded from the field of view by fat saturation banding. MRI THORACIC SPINE FINDINGS Alignment:  Physiologic. Vertebrae: No abnormal enhancement. Cord:  T6-T8 dilated central spinal canal.  No abnormal enhancement. Paraspinal and other soft tissues: Negative. IMPRESSION: 1. No abnormal enhancement of the cervical or thoracic spine for spinal cord. 2. T6-T8 dilated central spinal canal. No associated enhancing spinal cord lesion. Electronically Signed   By:  Kristine Garbe M.D.   On: 02/02/2018 01:34   Mr Thoracic Spine W Contrast  Result Date: 02/02/2018 CLINICAL DATA:  77 y/o F; weakness in both legs for 3 weeks worsening today. Worsening left leg weakness. EXAM: MRI CERVICAL SPINE WITHOUT CONTRAST MRI THORACIC SPINE WITHOUT CONTRAST TECHNIQUE: Multiplanar and multiecho pulse sequences of the cervical and thoracic spine were obtained without intravenous contrast. COMPARISON:  01/31/2018 cervical and thoracic spine MRI. FINDINGS: MRI CERVICAL SPINE FINDINGS Alignment: Straightening of cervical lordosis. Grade 1 C3-4 anterolisthesis. Vertebrae: No abnormal enhancement. C5-C7 ACDF. Susceptibility artifact from fusion hardware partially obscures the  vertebral bodies at those levels. Cord: No abnormal enhancement. Posterior Fossa, vertebral arteries, paraspinal tissues: Negative. The thyroid gland is excluded from the field of view by fat saturation banding. MRI THORACIC SPINE FINDINGS Alignment:  Physiologic. Vertebrae: No abnormal enhancement. Cord:  T6-T8 dilated central spinal canal.  No abnormal enhancement. Paraspinal and other soft tissues: Negative. IMPRESSION: 1. No abnormal enhancement of the cervical or thoracic spine for spinal cord. 2. T6-T8 dilated central spinal canal. No associated enhancing spinal cord lesion. Electronically Signed   By: Kristine Garbe M.D.   On: 02/02/2018 01:34        Scheduled Meds: . aspirin  81 mg Oral Daily  . calcium-vitamin D  1 tablet Oral q morning - 10a  . enoxaparin (LOVENOX) injection  40 mg Subcutaneous Q24H  . levothyroxine  88 mcg Oral QAC breakfast  . loratadine  10 mg Oral QPM  . metoprolol succinate  12.5 mg Oral Daily  . pantoprazole  40 mg Oral Daily  . Pirfenidone  801 mg Oral TID WC  . rosuvastatin  5 mg Oral QPM   Continuous Infusions:   LOS: 0 days    Time spent: 60mn    PDomenic Polite MD [facility] Page via [email or web address], password [email or web address] After 7PM please contact night-coverage  02/02/2018, 12:40 PM

## 2018-02-02 NOTE — Discharge Summary (Addendum)
Physician Discharge Summary  Tina Calderon QZR:[id number] DOB: 01-Feb-1941 DOA: 01/30/2018  PCP: [name], NP  Admit date: 01/30/2018 Discharge date: 02/03/2018  No significant updates on discharge summary dictated by Dr. Domenic Polite on 02/02/2018, discharge was held pending insurance approval, which was obtained on 02/03/2018.  Patient was seen and examined today, please see progress note dictated by me earlier today.  Recommendations for Outpatient Follow-up:  1. PCP in 1 week 2. [facility] in 2 weeks 3. SNF for Rehab 4. Incidentally noted thyroid nodule needs FU   Discharge Diagnoses:  Principal Problem:   Leg weakness Active Problems:   Hyperlipidemia   IPF (idiopathic pulmonary fibrosis) (HCC)   Essential hypertension   GERD (gastroesophageal reflux disease)   Hypothyroidism   Thyroid nodule   Gait abnormality   Chronic diastolic congestive heart failure (HCC)   Acute blood loss anemia   Anemia of chronic disease   Discharge Condition: stable  Diet recommendation: low sodium  Filed Weights   01/30/18 1533 01/30/18 1550 01/31/18 0606  Weight: 59.9 kg 59.9 kg 58.6 kg    History of present illness:  Tina Leblond Wimbishis a 77 y.o.femalewith medical history significant ofpulmonary Fibrosis, hyperlipidemia, asthma, GERD,dCHF, ILD, IPF, hypothyroidism, who presents with bilateral leg weakness and falls.   Hospital Course:     Bilateral leg weakness/hyperreflexia and increased tone -worse from the day prior to admission, with mild muscle pain, tremors and weakness -Neurology consulted, MRI thoracic and cervical spine completed, questionable mild T-spine spinal cord dilatation, per Neuro incidental finding and unlikely to cause any symptoms -NEuro suspects some functional component due to inconsistent exam, loss of tremors with distraction etc -she also has mild proximal muscle tenderness, although CPK was normal -I suspect that she could be having  mild Myalgias and muscle weakness from the Crestor that she was started over 1 month ago (12/18/2017), stopped Crestor -Physical therapy consulted, Rehab recommended -B-12 level is unremarkable -RPR is negative -MRI with post contrast images completed last pm without acute findings, Neuro doesn't think that  T6-T8 dilated central cord is responsible for any of her symptoms -Ongoing Physical therapy is the recommendation from Neurology  Hyperlipidemia: -Crestor stopped, seovee ab  IPF (idiopathic pulmonary fibrosis) ([place]): -continue Esbriet, has been on this for 4+ years -FU with Pulmonary  HTN:  -Continue home medications:Metoprolol -IV hydralazine prn  GERD: -Protonix  Hypothyroidism: Last TSH was0.7on 01/19/2018 -Continue home Synthroid  Thyroid nodule: -MRI showedan incidental 2.5 cm LEFT thyroid nodule. -Her TSH was normal, 0.7 on8/05/2018 -Follow-up with PCP  History of palpitations, seeing cardiology -Was wearing an event monitor for the last 2 weeks -2-D echocardiogram-noted normal EF, wall motion, grade 1 diastolic dysfunction, dilated left atrium and right ventricle -follow-up with Dr. Al Pimple  Consults :Neurology,Dr. Leonel Ramsay and Dr.Lindzen   Discharge Exam: Vitals:   02/02/18 1637 02/02/18 1928  BP: 131/82 (!) 156/78  Pulse: 78 87  Resp: 18 18  Temp: 98 F (36.7 C) 98 F (36.7 C)  SpO2: 96% 93%    General: AAOx3 Cardiovascular: S1S2/RRR Respiratory: CTAB  Discharge Instructions    Allergies as of 02/02/2018      Reactions   Dilaudid [hydromorphone] Nausea And Vomiting      Medication List    STOP taking these medications   hydrochlorothiazide 25 MG tablet Commonly known as:  HYDRODIURIL   rosuvastatin 5 MG tablet Commonly known as:  CRESTOR     TAKE these medications   aspirin 81 MG tablet Take  81 mg by mouth daily.   calcium-vitamin D 500-200 MG-UNIT tablet Commonly known as:  OSCAL WITH D Take 1  tablet by mouth every morning.   Cranberry 1000 MG Caps Take 1,000 mg by mouth daily.   ESBRIET 267 MG Caps Generic drug:  Pirfenidone TAKE 3 CAPSULES BY MOUTH THREE TIMES DAILY WITH FOOD What changed:  See the new instructions.   levothyroxine 88 MCG tablet Commonly known as:  SYNTHROID, LEVOTHROID TAKE 1 TABLET BY MOUTH EVERY DAY What changed:  when to take this   loratadine 10 MG tablet Commonly known as:  CLARITIN Take 10 mg by mouth every evening. Reported on 10/13/2015   metoprolol succinate 25 MG 24 hr tablet Commonly known as:  TOPROL-XL Take 1 tablet (25 mg total) by mouth daily. What changed:  how much to take   multivitamin with minerals Tabs tablet Take 1 tablet by mouth every morning. Centrum silver   omeprazole 20 MG capsule Commonly known as:  PRILOSEC TAKE 1 CAPSULE (20 MG TOTAL) BY MOUTH DAILY. What changed:  See the new instructions.      Allergies  Allergen Reactions  . Dilaudid [Hydromorphone] Nausea And Vomiting   Follow-up Information    [facility]. Schedule an appointment as soon as possible for a visit in 2 week(s).   Contact information: [address]     [address] [place] [address] [phone]       [name], NP. Schedule an appointment as soon as possible for a visit in 1 week(s).   Specialty:  Internal Medicine Contact information: [place] [phone]            The results of significant diagnostics from this hospitalization (including imaging, microbiology, ancillary and laboratory) are listed below for reference.    Significant Diagnostic Studies: Dg Chest 2 View  Result Date: 01/12/2018 CLINICAL DATA:  Onset of weakness and tachycardia in rehab. Patient reports not feeling well for the past 3 days. History of CHF and interstitial lung disease-pulmonary fibrosis, nonsmoker. EXAM: CHEST - 2 VIEW COMPARISON:  CT scan chest of November 28, 2017.  FINDINGS: The lungs are adequately inflated. The interstitial markings are coarse bilaterally but stable. The heart is mildly enlarged. The pulmonary vascularity is not engorged. There is a large hiatal hernia-partially intrathoracic stomach. There is no pleural effusion. There is calcification in the wall of the aortic arch. The bony thorax is unremarkable. IMPRESSION: Chronic bronchitic-fibrotic changes, stable.  No acute pneumonia. Stable mild cardiomegaly without pulmonary vascular congestion. Thoracic aortic atherosclerosis. Electronically Signed   By: David  Martinique M.D.   On: 01/12/2018 11:40   Ct Head Wo Contrast  Result Date: 01/30/2018 CLINICAL DATA:  Syncopal episode EXAM: CT HEAD WITHOUT CONTRAST TECHNIQUE: Contiguous axial images were obtained from the base of the skull through the vertex without intravenous contrast. COMPARISON:  None. FINDINGS: Brain: No acute territorial infarction, hemorrhage or intracranial mass. Mild atrophy. Small vessel ischemic changes of the white matter. Stable ventricle size. Vascular: No hyperdense vessels. Scattered calcifications at the carotid siphons. Skull: Normal. Negative for fracture or focal lesion. Sinuses/Orbits: No acute finding. Other: None IMPRESSION: No CT evidence for acute intracranial abnormality. Atrophy with small vessel ischemic changes of the white matter. Electronically Signed   By: Donavan Foil M.D.   On: 01/30/2018 19:04   Ct Angio Chest Pe W And/or Wo Contrast  Result Date: 01/12/2018 CLINICAL DATA:  Patient reports that her heart rate  has felt irregular all weekend and that she just "hasn't felt good". Went to cardiac rehab this morning and her resting heart rate was 125. Patient denies history of irregular rhythm. History of pulmonary fibrosis. EXAM: CT ANGIOGRAPHY CHEST WITH CONTRAST TECHNIQUE: Multidetector CT imaging of the chest was performed using the standard protocol during bolus administration of intravenous contrast. Multiplanar  CT image reconstructions and MIPs were obtained to evaluate the vascular anatomy. CONTRAST:  25m ISOVUE-370 IOPAMIDOL (ISOVUE-370) INJECTION 76% COMPARISON:  Chest CT dated 11/28/2017. FINDINGS: Cardiovascular: Some of the most peripheral segmental and subsegmental pulmonary arteries cannot be definitively characterized due to patient breathing motion artifact, however, there is no pulmonary embolism identified within the main, lobar or central segmental pulmonary arteries bilaterally. Cardiomegaly. No pericardial effusion. Coronary artery calcifications. Scattered aortic atherosclerosis. No thoracic aortic aneurysm or evidence of aortic dissection. Mediastinum/Nodes: Large hiatal hernia. No mass or enlarged lymph nodes seen within the mediastinum or perihilar regions. Trachea and central bronchi are unremarkable. Lungs/Pleura: Lungs are not significantly changed in appearance compared to the previous high-resolution chest CT of 11/28/2017, compatible with the previous description of chronic interstitial lung disease (NSIP), perhaps slightly lower lung volumes accentuating the bibasilar markings with associated bibasilar atelectasis. No evidence of pneumonia. No pleural effusion or pneumothorax. Upper Abdomen: No acute findings. Musculoskeletal: Mild degenerative spurring within the thoracic spine. No acute or suspicious osseous finding. Review of the MIP images confirms the above findings. IMPRESSION: 1. No acute findings. No pulmonary embolism seen. No evidence of pneumonia or pulmonary edema. 2. 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 3. Large hiatal hernia, stable. 4. Cardiomegaly. 5. Coronary artery calcifications. Aortic Atherosclerosis (ICD10-I70.0). Electronically Signed   By: SFranki CabotM.D.   On: 01/12/2018 14:00   Mr Cervical Spine Wo Contrast  Result Date: 01/31/2018 CLINICAL  DATA:  Generalized weakness. History of cervical spine surgery, cancer. EXAM: MRI CERVICAL AND THORACIC SPINE WITHOUT CONTRAST TECHNIQUE: Multiplanar and multiecho pulse sequences of the cervical spine, to include the craniocervical junction and cervicothoracic junction, and thoracic spine, were obtained without intravenous contrast. COMPARISON:  CT HEAD January 30, 2018, MRI cervical spine January 02, 2009. FINDINGS: MRI CERVICAL SPINE FINDINGS ALIGNMENT: Straightened cervical lordosis. Grade 1 C3-4 anterolisthesis. VERTEBRAE/DISCS: Vertebral bodies are intact. Status post C5 through C7 ACDF with bridging bone marrow signal compatible with arthrodesis. Mild bone marrow edema LEFT C3-4 facets. Minimal C3-4 disc height loss with multilevel disc desiccation. CORD:Cervical spinal cord is normal morphology and signal characteristics. POSTERIOR FOSSA, VERTEBRAL ARTERIES, PARASPINAL TISSUES: No MR findings of ligamentous injury. Vertebral artery flow voids present. Patchy pontine T2 hyperintensities compatible with chronic small vessel ischemic changes. DISC LEVELS: C2-3: No disc bulge, canal stenosis nor neural foraminal narrowing. Mild LEFT facet arthropathy. C3-4: Anterolisthesis. Severe LEFT facet arthropathy with effusion, likely reactive. Uncovertebral hypertrophy and small broad-based disc bulge. Mild canal stenosis. Mild RIGHT, moderate to severe LEFT neural foraminal narrowing. C4-5. Uncovertebral hypertrophy. Severe LEFT facet arthropathy. No canal stenosis. Mild LEFT neural foraminal narrowing. C5-6, C6-7: ACDF.  No canal stenosis or neural foraminal narrowing. C7-T1: No disc bulge, canal stenosis nor neural foraminal narrowing. Moderate facet arthropathy. MRI THORACIC SPINE FINDINGS ALIGNMENT: Maintenance of the thoracic kyphosis. No malalignment. VERTEBRAE/DISCS: Vertebral bodies are intact. Intervertebral discs morphology and signal are normal, multilevel mild chronic discogenic endplate changes. No abnormal or  acute bone marrow signal. CORD: Approximately 3 cm segment of dilated central  spinal canal T6 through T7-8, to 2 mm without cord edema. PREVERTEBRAL AND PARASPINAL SOFT TISSUES: Nonacute. 2.5 cm T2 bright LEFT thyroid nodule. DISC LEVELS: No disc bulge, canal stenosis nor neural foraminal narrowing. IMPRESSION: MRI cervical spine: 1. Grade 1 C3-4 anterolisthesis. Severe LEFT C3-4 facet arthropathy and acute reactive changes. 2. No fracture or acute osseous process. C5 through C7 ACDF with arthrodesis. 3. Degenerative change of the cervical spine. Mild canal stenosis C3-4. 4. Moderate to severe LEFT C3-4 neural foraminal narrowing. MRI thoracic spine: 1. No fracture, malalignment or acute osseous process. 2. Mildly dilated midthoracic central spinal canal, borderline findings for syrinx. 3. **An incidental finding of potential clinical significance has been found. 2.5 cm LEFT thyroid nodule. Recommend NONEMERGENT thyroid sonogram. This follows ACR consensus guidelines: Managing Incidental Thyroid Nodules Detected on Imaging: White Paper of the ACR Incidental Thyroid Findings Committee. J Am Coll Radiol 2015; 12:143-150.** Electronically Signed   By: Elon Alas M.D.   On: 01/31/2018 01:29   Mr Cervical Spine W Contrast  Result Date: 02/02/2018 CLINICAL DATA:  77 y/o F; weakness in both legs for 3 weeks worsening today. Worsening left leg weakness. EXAM: MRI CERVICAL SPINE WITHOUT CONTRAST MRI THORACIC SPINE WITHOUT CONTRAST TECHNIQUE: Multiplanar and multiecho pulse sequences of the cervical and thoracic spine were obtained without intravenous contrast. COMPARISON:  01/31/2018 cervical and thoracic spine MRI. FINDINGS: MRI CERVICAL SPINE FINDINGS Alignment: Straightening of cervical lordosis. Grade 1 C3-4 anterolisthesis. Vertebrae: No abnormal enhancement. C5-C7 ACDF. Susceptibility artifact from fusion hardware partially obscures the vertebral bodies at those levels. Cord: No abnormal enhancement.  Posterior Fossa, vertebral arteries, paraspinal tissues: Negative. The thyroid gland is excluded from the field of view by fat saturation banding. MRI THORACIC SPINE FINDINGS Alignment:  Physiologic. Vertebrae: No abnormal enhancement. Cord:  T6-T8 dilated central spinal canal.  No abnormal enhancement. Paraspinal and other soft tissues: Negative. IMPRESSION: 1. No abnormal enhancement of the cervical or thoracic spine for spinal cord. 2. T6-T8 dilated central spinal canal. No associated enhancing spinal cord lesion. Electronically Signed   By: Kristine Garbe M.D.   On: 02/02/2018 01:34   Mr Thoracic Spine Wo Contrast  Result Date: 01/31/2018 CLINICAL DATA:  Generalized weakness. History of cervical spine surgery, cancer. EXAM: MRI CERVICAL AND THORACIC SPINE WITHOUT CONTRAST TECHNIQUE: Multiplanar and multiecho pulse sequences of the cervical spine, to include the craniocervical junction and cervicothoracic junction, and thoracic spine, were obtained without intravenous contrast. COMPARISON:  CT HEAD January 30, 2018, MRI cervical spine January 02, 2009. FINDINGS: MRI CERVICAL SPINE FINDINGS ALIGNMENT: Straightened cervical lordosis. Grade 1 C3-4 anterolisthesis. VERTEBRAE/DISCS: Vertebral bodies are intact. Status post C5 through C7 ACDF with bridging bone marrow signal compatible with arthrodesis. Mild bone marrow edema LEFT C3-4 facets. Minimal C3-4 disc height loss with multilevel disc desiccation. CORD:Cervical spinal cord is normal morphology and signal characteristics. POSTERIOR FOSSA, VERTEBRAL ARTERIES, PARASPINAL TISSUES: No MR findings of ligamentous injury. Vertebral artery flow voids present. Patchy pontine T2 hyperintensities compatible with chronic small vessel ischemic changes. DISC LEVELS: C2-3: No disc bulge, canal stenosis nor neural foraminal narrowing. Mild LEFT facet arthropathy. C3-4: Anterolisthesis. Severe LEFT facet arthropathy with effusion, likely reactive. Uncovertebral  hypertrophy and small broad-based disc bulge. Mild canal stenosis. Mild RIGHT, moderate to severe LEFT neural foraminal narrowing. C4-5. Uncovertebral hypertrophy. Severe LEFT facet arthropathy. No canal stenosis. Mild LEFT neural foraminal narrowing. C5-6, C6-7: ACDF.  No canal stenosis or neural foraminal narrowing. C7-T1: No disc bulge, canal stenosis nor neural foraminal narrowing. Moderate facet  arthropathy. MRI THORACIC SPINE FINDINGS ALIGNMENT: Maintenance of the thoracic kyphosis. No malalignment. VERTEBRAE/DISCS: Vertebral bodies are intact. Intervertebral discs morphology and signal are normal, multilevel mild chronic discogenic endplate changes. No abnormal or acute bone marrow signal. CORD: Approximately 3 cm segment of dilated central spinal canal T6 through T7-8, to 2 mm without cord edema. PREVERTEBRAL AND PARASPINAL SOFT TISSUES: Nonacute. 2.5 cm T2 bright LEFT thyroid nodule. DISC LEVELS: No disc bulge, canal stenosis nor neural foraminal narrowing. IMPRESSION: MRI cervical spine: 1. Grade 1 C3-4 anterolisthesis. Severe LEFT C3-4 facet arthropathy and acute reactive changes. 2. No fracture or acute osseous process. C5 through C7 ACDF with arthrodesis. 3. Degenerative change of the cervical spine. Mild canal stenosis C3-4. 4. Moderate to severe LEFT C3-4 neural foraminal narrowing. MRI thoracic spine: 1. No fracture, malalignment or acute osseous process. 2. Mildly dilated midthoracic central spinal canal, borderline findings for syrinx. 3. **An incidental finding of potential clinical significance has been found. 2.5 cm LEFT thyroid nodule. Recommend NONEMERGENT thyroid sonogram. This follows ACR consensus guidelines: Managing Incidental Thyroid Nodules Detected on Imaging: White Paper of the ACR Incidental Thyroid Findings Committee. J Am Coll Radiol 2015; 12:143-150.** Electronically Signed   By: Elon Alas M.D.   On: 01/31/2018 01:29   Mr Thoracic Spine W Contrast  Result Date:  02/02/2018 CLINICAL DATA:  77 y/o F; weakness in both legs for 3 weeks worsening today. Worsening left leg weakness. EXAM: MRI CERVICAL SPINE WITHOUT CONTRAST MRI THORACIC SPINE WITHOUT CONTRAST TECHNIQUE: Multiplanar and multiecho pulse sequences of the cervical and thoracic spine were obtained without intravenous contrast. COMPARISON:  01/31/2018 cervical and thoracic spine MRI. FINDINGS: MRI CERVICAL SPINE FINDINGS Alignment: Straightening of cervical lordosis. Grade 1 C3-4 anterolisthesis. Vertebrae: No abnormal enhancement. C5-C7 ACDF. Susceptibility artifact from fusion hardware partially obscures the vertebral bodies at those levels. Cord: No abnormal enhancement. Posterior Fossa, vertebral arteries, paraspinal tissues: Negative. The thyroid gland is excluded from the field of view by fat saturation banding. MRI THORACIC SPINE FINDINGS Alignment:  Physiologic. Vertebrae: No abnormal enhancement. Cord:  T6-T8 dilated central spinal canal.  No abnormal enhancement. Paraspinal and other soft tissues: Negative. IMPRESSION: 1. No abnormal enhancement of the cervical or thoracic spine for spinal cord. 2. T6-T8 dilated central spinal canal. No associated enhancing spinal cord lesion. Electronically Signed   By: Kristine Garbe M.D.   On: 02/02/2018 01:34    Microbiology: No results found for this or any previous visit (from the past 240 hour(s)).   Labs: Basic Metabolic Panel: Recent Labs  Lab 01/29/18 0759 01/30/18 1604 01/31/18 0337 01/31/18 0558  NA 138 138  --  137  K 4.3 4.4  --  3.8  CL 102 104  --  101  CO2 29 26  --  28  GLUCOSE 95 109*  --  102*  BUN 12 9  --  7*  CREATININE 0.95 0.82  --  0.83  CALCIUM 9.4 9.1  --  9.0  MG  --   --  2.0  --    Liver Function Tests: No results for input(s): AST, ALT, ALKPHOS, BILITOT, PROT, ALBUMIN in the last 168 hours. No results for input(s): LIPASE, AMYLASE in the last 168 hours. No results for input(s): AMMONIA in the last 168  hours. CBC: Recent Labs  Lab 01/30/18 1604 01/31/18 0558  WBC 7.5 7.1  HGB 10.9* 10.6*  HCT 35.7* 33.4*  MCV 81.7 81.1  PLT 257 220   Cardiac Enzymes: Recent Labs  Lab 01/31/18  0337  CKTOTAL 63   BNP: BNP (last 3 results) Recent Labs    01/31/18 0558  BNP 203.2*    ProBNP (last 3 results) No results for input(s): PROBNP in the last 8760 hours.  CBG: Recent Labs  Lab 01/30/18 1728  GLUCAP 85       Signed:  Domenic Polite MD.  [facility] 02/02/2018, 9:28 PM

## 2018-02-02 NOTE — Consult Note (Signed)
Physical Medicine and Rehabilitation Consult Reason for Consult: Decreased functional mobility Referring Physician: [facility]   HPI: Tina Calderon is a 77 y.o. right-handed female with history of hypertension, diastolic congestive heart failure, chronic fatigue, idiopathic pulmonary fibrosis.  Per report patient lives with spouse.  One level home with level entry.  Independent prior to admission.  Husband is limited physically.  History taken from chart review, patient, and family. Presented 01/31/2018 with bilateral lower extremity weakness.  Cranial CT scan reviewed, unremarkable for acute intracranial process. MRI cervical thoracic spine showed grade 1 C3-4 anterolisthesis.  Severe left C3-4 facet arthropathy with no acute reactive changes.  No fracture or acute osseous process.  C5-C7 ACDF with arthrodesis.  Moderate to severe left C3-4 neural foraminal narrowing.  MRI thoracic spine with no fracture malalignment or acute osseous process.  Mild dilated mid thoracic central spinal canal, borderline findings of syrinx.  Incidental finding of potential clinical significance 2.5 cm left thyroid nodule.  Echocardiogram with ejection fraction of 29% grade 1 diastolic dysfunction.  Neurology work-up presently ongoing.  Overall presentation consistent with nonphysiologic etiologies such as conversion disorder.  Presently on Lovenox for DVT prophylaxis.  Therapy evaluation completed with recommendations of physical medicine rehab consult.  Review of Systems  Constitutional: Positive for malaise/fatigue. Negative for chills and fever.  HENT: Negative for hearing loss.   Eyes: Negative for blurred vision and double vision.  Respiratory: Negative for cough.        Shortness of breath with exertion  Cardiovascular: Positive for leg swelling. Negative for chest pain and palpitations.  Gastrointestinal: Positive for constipation. Negative for nausea and vomiting.       GERD  Genitourinary: Negative for  flank pain and hematuria.  Musculoskeletal: Positive for myalgias.  Skin: Negative for rash.  Neurological: Positive for weakness.  All other systems reviewed and are negative.  Past Medical History:  Diagnosis Date  . Anginal pain ([place])   . Cancer ([place])   . CHF (congestive heart failure) ([place]) 01/22/2012  . Chronic fatigue 07/15/2014  . GERD (gastroesophageal reflux disease) 05/12/2017  . Hypertension   . Hypothyroidism   . ILD (interstitial lung disease) ([place]) 02/04/2012  . IPF (idiopathic pulmonary fibrosis) ([place]) 2013  . Mild intermittent asthma 01/29/2016  . Pelvic relaxation   . Prolapse of bladder   . Shortness of breath   . Thyroid disease    Past Surgical History:  Procedure Laterality Date  . ANTERIOR AND POSTERIOR REPAIR  12/05/2011   Procedure: ANTERIOR (CYSTOCELE) AND POSTERIOR REPAIR (RECTOCELE);  Surgeon: [name], MD;  Location: [facility];  Service: Gynecology;  Laterality: N/A;  with cysto  . Bladder tact  15 years ago  . LEFT HEART CATHETERIZATION WITH CORONARY ANGIOGRAM N/A 01/22/2012   Procedure: LEFT HEART CATHETERIZATION WITH CORONARY ANGIOGRAM;  Surgeon: [name], MD;  Location: [facility] CATH [facility];  Service: Cardiovascular;  Laterality: N/A;  . NECK SURGERY  2010  . SHOULDER SURGERY    . thyroid disease    . TONSILLECTOMY    . VAGINAL HYSTERECTOMY  15 years ago   Family History  Problem Relation Age of Onset  . Thyroid disease Mother   . Hypertension Mother   . Coronary artery disease Mother   . Dementia Mother   . Aneurysm Father   . Pulmonary fibrosis Sister   . Stroke Other   . Sleep apnea Other   . Cancer Brother        LIVER  .  Pulmonary fibrosis Brother    Social History:  reports that she has never smoked. She has never used smokeless tobacco. She reports that she does not drink alcohol or use drugs. Allergies:  Allergies  Allergen Reactions  . Dilaudid [Hydromorphone] Nausea And Vomiting   Medications Prior to Admission  Medication  Sig Dispense Refill  . aspirin 81 MG tablet Take 81 mg by mouth daily.    . calcium-vitamin D (OSCAL WITH D) 500-200 MG-UNIT per tablet Take 1 tablet by mouth every morning.    . Cranberry 1000 MG CAPS Take 1,000 mg by mouth daily.     . ESBRIET 267 MG CAPS TAKE 3 CAPSULES BY MOUTH THREE TIMES DAILY WITH FOOD (Patient taking differently: Take 801 mg by mouth 3 (three) times daily. ) 270 capsule 3  . levothyroxine (SYNTHROID, LEVOTHROID) 88 MCG tablet TAKE 1 TABLET BY MOUTH EVERY DAY (Patient taking differently: Take 88 mcg by mouth daily before breakfast. ) 90 tablet 1  . loratadine (CLARITIN) 10 MG tablet Take 10 mg by mouth every evening. Reported on 10/13/2015    . metoprolol succinate (TOPROL-XL) 25 MG 24 hr tablet Take 1 tablet (25 mg total) by mouth daily. (Patient taking differently: Take 12.5 mg by mouth daily. )    . Multiple Vitamin (MULTIVITAMIN WITH MINERALS) TABS Take 1 tablet by mouth every morning. Centrum silver    . omeprazole (PRILOSEC) 20 MG capsule TAKE 1 CAPSULE (20 MG TOTAL) BY MOUTH DAILY. (Patient taking differently: Take 20 mg by mouth daily. ) 90 capsule 1  . rosuvastatin (CRESTOR) 5 MG tablet Take 1 tablet by mouth every evening for cholesterol. (Patient taking differently: Take 5 mg by mouth every evening. ) 90 tablet 3  . hydrochlorothiazide (HYDRODIURIL) 25 MG tablet TAKE ONE TABLET BY MOUTH EVERY DAY FOR BLOOD PRESSURE (Patient not taking: Reported on 01/30/2018) 90 tablet 3    Home: Home Living Family/patient expects to be discharged to:: Inpatient rehab Living Arrangements: Spouse/significant other Available Help at Discharge: Family, Available 24 hours/day Type of Home: Apartment Home Access: Level entry Home Layout: One level Biochemist, clinical: Standard Bathroom Accessibility: No Home Equipment: None Additional Comments: husband can't A pt he is frail himself  Functional History: Prior Function Level of Independence: Independent Functional Status:    Mobility: Bed Mobility Overal bed mobility: Needs Assistance Bed Mobility: Rolling, Sidelying to Sit Rolling: Min assist Sidelying to sit: Mod assist Transfers Overall transfer level: Needs assistance Equipment used: None, 1 person hand held assist Transfers: Lateral/Scoot Transfers  Lateral/Scoot Transfers: Min assist General transfer comment: pt cannot stand with max A from chair, drop arm lateral scoot with min A back onto bed.  Ambulation/Gait General Gait Details: unable at this time    ADL: ADL Overall ADL's : Needs assistance/impaired Eating/Feeding: Independent Eating/Feeding Details (indicate cue type and reason): supported sitting Grooming: Set up Grooming Details (indicate cue type and reason): supported sitting Upper Body Bathing: Set up Upper Body Bathing Details (indicate cue type and reason): supported sitting Lower Body Bathing: Moderate assistance, Sitting/lateral leans Upper Body Dressing : Set up Upper Body Dressing Details (indicate cue type and reason): supported sitting Lower Body Dressing: Total assistance, Sitting/lateral leans Toilet Transfer: Minimal assistance Toilet Transfer Details (indicate cue type and reason): lateral scoot to drop arm recliner Toileting- Clothing Manipulation and Hygiene: Moderate assistance, Sitting/lateral lean  Cognition: Cognition Overall Cognitive Status: Within Functional Limits for tasks assessed Orientation Level: Oriented X4 Cognition Arousal/Alertness: Awake/alert Behavior During Therapy: Anxious Overall Cognitive Status:  Within Functional Limits for tasks assessed  Blood pressure (!) 159/86, pulse 74, temperature 97.8 F (36.6 C), temperature source Oral, resp. rate 18, height _0  (1.575 m), weight 58.6 kg, SpO2 98 %. Physical Exam  Vitals reviewed. Constitutional: She is oriented to person, place, and time. She appears well-developed and well-nourished.  HENT:  Head: Normocephalic and atraumatic.  Eyes:  EOM are normal. Right eye exhibits no discharge. Left eye exhibits no discharge.  Neck: Normal range of motion. Neck supple. No thyromegaly present.  Cardiovascular: Normal rate, regular rhythm and normal heart sounds.  Respiratory: Effort normal and breath sounds normal. No respiratory distress.  GI: Soft. Bowel sounds are normal. She exhibits no distension.  Musculoskeletal:  No edema or tenderness in extremities  Neurological: She is alert and oriented to person, place, and time.  Motor: Bilateral upper extremities: 5/5 proximal distal Bilateral lower extremity: Hip flexion 3/5, knee extension 4+/5, ankle dorsiflexion 5/5; however able to flex hips against resistance but not able to lift legs. Sensation intact light touch  Skin: Skin is warm and dry.  Psychiatric: She has a normal mood and affect. Her behavior is normal.    No results found for this or any previous visit (from the past 24 hour(s)). Mr Cervical Spine W Contrast  Result Date: 02/02/2018 CLINICAL DATA:  77 y/o F; weakness in both legs for 3 weeks worsening today. Worsening left leg weakness. EXAM: MRI CERVICAL SPINE WITHOUT CONTRAST MRI THORACIC SPINE WITHOUT CONTRAST TECHNIQUE: Multiplanar and multiecho pulse sequences of the cervical and thoracic spine were obtained without intravenous contrast. COMPARISON:  01/31/2018 cervical and thoracic spine MRI. FINDINGS: MRI CERVICAL SPINE FINDINGS Alignment: Straightening of cervical lordosis. Grade 1 C3-4 anterolisthesis. Vertebrae: No abnormal enhancement. C5-C7 ACDF. Susceptibility artifact from fusion hardware partially obscures the vertebral bodies at those levels. Cord: No abnormal enhancement. Posterior Fossa, vertebral arteries, paraspinal tissues: Negative. The thyroid gland is excluded from the field of view by fat saturation banding. MRI THORACIC SPINE FINDINGS Alignment:  Physiologic. Vertebrae: No abnormal enhancement. Cord:  T6-T8 dilated central spinal canal.  No abnormal  enhancement. Paraspinal and other soft tissues: Negative. IMPRESSION: 1. No abnormal enhancement of the cervical or thoracic spine for spinal cord. 2. T6-T8 dilated central spinal canal. No associated enhancing spinal cord lesion. Electronically Signed   By: Kristine Garbe M.D.   On: 02/02/2018 01:34   Mr Thoracic Spine W Contrast  Result Date: 02/02/2018 CLINICAL DATA:  77 y/o F; weakness in both legs for 3 weeks worsening today. Worsening left leg weakness. EXAM: MRI CERVICAL SPINE WITHOUT CONTRAST MRI THORACIC SPINE WITHOUT CONTRAST TECHNIQUE: Multiplanar and multiecho pulse sequences of the cervical and thoracic spine were obtained without intravenous contrast. COMPARISON:  01/31/2018 cervical and thoracic spine MRI. FINDINGS: MRI CERVICAL SPINE FINDINGS Alignment: Straightening of cervical lordosis. Grade 1 C3-4 anterolisthesis. Vertebrae: No abnormal enhancement. C5-C7 ACDF. Susceptibility artifact from fusion hardware partially obscures the vertebral bodies at those levels. Cord: No abnormal enhancement. Posterior Fossa, vertebral arteries, paraspinal tissues: Negative. The thyroid gland is excluded from the field of view by fat saturation banding. MRI THORACIC SPINE FINDINGS Alignment:  Physiologic. Vertebrae: No abnormal enhancement. Cord:  T6-T8 dilated central spinal canal.  No abnormal enhancement. Paraspinal and other soft tissues: Negative. IMPRESSION: 1. No abnormal enhancement of the cervical or thoracic spine for spinal cord. 2. T6-T8 dilated central spinal canal. No associated enhancing spinal cord lesion. Electronically Signed   By: Kristine Garbe M.D.   On: 02/02/2018 01:34  Assessment/Plan: Diagnosis: nonphysiological lower extremity weakness Labs and images (see above) independently reviewed.  Records reviewed and summated above.  1. Does the need for close, 24 hr/day medical supervision in concert with the patient's rehab needs make it unreasonable for this  patient to be served in a less intensive setting? No  2. Co-Morbidities requiring supervision/potential complications: HTN (monitor and provide prns in accordance with increased physical exertion and pain), diastolic congestive heart failure (monitor for signs and symptoms of fluid overload), chronic fatigue, idiopathic pulmonary fibrosis, acute on chronic anemia (transfuse if necessary to ensure appropriate perfusion for increased activity tolerance) 3. Due to safety, disease management and patient education, does the patient require 24 hr/day rehab nursing? Potentially 4. Does the patient require coordinated care of a physician, rehab nurse, PT (1-2 hrs/day, 5 days/week) and OT (1-2 hrs/day, 5 days/week) to address physical and functional deficits in the context of the above medical diagnosis(es)? Potentially Addressing deficits in the following areas: balance, endurance, locomotion, strength, transferring, bathing, dressing, toileting and psychosocial support 5. Can the patient actively participate in an intensive therapy program of at least 3 hrs of therapy per day at least 5 days per week? Yes 6. The potential for patient to make measurable gains while on inpatient rehab is good 7. Anticipated functional outcomes upon discharge from inpatient rehab are supervision and min assist  with PT, supervision and min assist with OT, n/a with SLP. 8. Estimated rehab length of stay to reach the above functional goals is: 8-12 days. 9. Anticipated D/C setting: Home 10. Anticipated post D/C treatments: HH therapy and Home excercise program 11. Overall Rehab/Functional Prognosis: good  RECOMMENDATIONS: This patient's condition is appropriate for continued rehabilitative care in the following setting: patient does not have a diagnosis amenable to CIR.  if no caregiver supportavailable at discharge, recommend SNF. Patient has agreed to participate in recommended program. Potentially Note that insurance prior  authorization may be required for reimbursement for recommended care.  Comment: Rehab Admissions Coordinator to follow up.   I have personally performed a face to face diagnostic evaluation, including, but not limited to relevant history and physical exam findings, of this patient and developed relevant assessment and plan.  Additionally, I have reviewed and concur with the physician assistant's documentation above.   [name], MD, ABPMR Lavon Paganini Angiulli, PA-C 02/02/2018

## 2018-02-02 NOTE — Progress Notes (Signed)
Pt alert and oriented x4, no complaints of pain or discomfort.  Bed in low position, call bell within reach.  Bed alarms on and functioning.  Assessment done and charted.  Will continue to monitor and do hourly rounding throughout the shift

## 2018-02-02 NOTE — Progress Notes (Signed)
Talking with Dr Jacinta Shoe

## 2018-02-02 NOTE — Progress Notes (Signed)
Occupational Therapy Treatment Patient Details Name: LORANN TANI MRN: [id number] DOB: 20-Nov-1940 Today's Date: 02/02/2018    History of present illness DEMETRICE AMSTUTZ is a 77 y.o. female with medical history significant of hypertension, hyperlipidemia, asthma, GERD, dCHF,  ILD, IPF, hypothyroidism, who presents with bilateral leg weakness, intermittent hypertonicity, and shaking of legs (R>L). MRI: MRI C and T-spine- mild dilatation of the central canal suspicious for syrinx   OT comments  Pt able to stand x 2 this session. Pt performing bed mobility with min guard assist for balance. Pt standing for 30 seconds with R LE appearing to tremor and pt abruptly returning to seated position. Pt standing second time with therapist seated in front and pt standing with mod lifting assistance and standing for 3 minutes without tremor noted. Once seated, pt then began to have tremor like movement in R LE. Pt returning to supine in bed with min guard as well. Pt continued to benefit from OT intervention.   Follow Up Recommendations  CIR;Supervision/Assistance - 24 hour    Equipment Recommendations  Other (comment)(TBD)    Recommendations for Other Services      Precautions / Restrictions Precautions Precautions: Fall Restrictions Weight Bearing Restrictions: No       Mobility Bed Mobility Overal bed mobility: Needs Assistance Bed Mobility: Rolling;Supine to Sit;Sit to Supine     Supine to sit: Min guard Sit to supine: Min guard         Balance Overall balance assessment: Needs assistance Sitting-balance support: No upper extremity supported;Feet supported Sitting balance-Leahy Scale: Fair          ADL either performed or assessed with clinical judgement   ADL      Lower Body Dressing: Total assistance Lower Body Dressing Details (indicate cue type and reason): B socks          Vision Patient Visual Report: No change from baseline            Cognition  Arousal/Alertness: Awake/alert Behavior During Therapy: Anxious Overall Cognitive Status: Within Functional Limits for tasks assessed                    Pertinent Vitals/ Pain       Pain Assessment: No/denies pain         Frequency  Min 3X/week        Progress Toward Goals  OT Goals(current goals can now be found in the care plan section)  Progress towards OT goals: Progressing toward goals  Acute Rehab OT Goals Patient Stated Goal: to get better OT Goal Formulation: With patient Time For Goal Achievement: 02/16/18 Potential to Achieve Goals: Good  Plan Discharge plan remains appropriate       AM-PAC PT "6 Clicks" Daily Activity     Outcome Measure   Help from another person eating meals?: None Help from another person taking care of personal grooming?: None Help from another person toileting, which includes using toliet, bedpan, or urinal?: A Lot Help from another person bathing (including washing, rinsing, drying)?: A Lot Help from another person to put on and taking off regular upper body clothing?: A Little Help from another person to put on and taking off regular lower body clothing?: A Lot 6 Click Score: 17    End of Session    OT Visit Diagnosis: Muscle weakness (generalized) (M62.81)   Activity Tolerance Patient tolerated treatment well   Patient Left in chair;with call bell/phone within reach;with chair alarm set   Nurse  Communication Mobility status        Time: 4782-9562 OT Time Calculation (min): 16 min  Charges: OT General Charges $OT Visit: 1 Visit OT Treatments $Therapeutic Activity: 8-22 mins     Sevin Farone, Latanya Presser, MS, OTR/L 02/02/2018, 12:25 PM

## 2018-02-03 DIAGNOSIS — R29898 Other symptoms and signs involving the musculoskeletal system: Secondary | ICD-10-CM | POA: Diagnosis not present

## 2018-02-03 DIAGNOSIS — R531 Weakness: Secondary | ICD-10-CM

## 2018-02-03 NOTE — Progress Notes (Signed)
Physical Therapy Treatment Patient Details Name: Tina Calderon MRN: [id number] DOB: 03-29-1941 Today's Date: 02/03/2018    History of Present Illness Tina Calderon is a 77 y.o. female with medical history significant of hypertension, hyperlipidemia, asthma, GERD, dCHF,  ILD, IPF, hypothyroidism, who presents with bilateral leg weakness, intermittent hypertonicity, and shaking of legs (R>L). 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 Pt with bilat LE tremors initially with weight bearing but with noted improvement with mobility and relaxation techniques. Continue to progress as tolerated.   Follow Up Recommendations  CIR     Equipment Recommendations  (TBD)    Recommendations for Other Services Rehab consult     Precautions / Restrictions Precautions Precautions: Fall Restrictions Weight Bearing Restrictions: No    Mobility  Bed Mobility Overal bed mobility: Needs Assistance Bed Mobility: Supine to Sit     Supine to sit: Min guard        Transfers Overall transfer level: Needs assistance Equipment used: Rolling walker (2 wheeled) Transfers: Sit to/from Stand Sit to Stand: Min assist;+2 safety/equipment         General transfer comment: assist to power up into standing and for balance upon standing; pt with bilat LE tremors upon initial weight bearing and pt became very anxious; with breathing technique pt became calmer and able to control bilat LE tremors; second trial from recliner pt with decreased bilat LE tremors  Ambulation/Gait Ambulation/Gait assistance: Min assist;+2 safety/equipment Gait Distance (Feet): (29f X2) Assistive device: Rolling walker (2 wheeled) Gait Pattern/deviations: Step-through pattern;Decreased stride length;Narrow base of support     General Gait Details:  cues for breathing technique, proximity to R[employer] Corporationwider BOS   Stairs             Wheelchair Mobility    Modified Rankin (Stroke Patients Only)       Balance Overall balance assessment: Needs assistance Sitting-balance support: No upper extremity supported;Feet supported Sitting balance-Leahy Scale: Fair     Standing balance support: Bilateral upper extremity supported;During functional activity Standing balance-Leahy Scale: Poor                              Cognition Arousal/Alertness: Awake/alert Behavior During Therapy: Anxious Overall Cognitive Status: Within Functional Limits for tasks assessed                                 General Comments: get becomes very anxious with attempts of OOB mobility       Exercises      General Comments        Pertinent Vitals/Pain Pain Assessment: No/denies pain    Home Living                      Prior Function            PT Goals (current goals can now be found in the care plan section) Acute Rehab PT Goals Patient Stated Goal: to get better PT Goal Formulation: With patient Time For Goal Achievement: 02/14/18 Potential to Achieve Goals: Fair Progress towards PT goals: Progressing toward goals    Frequency    Min 3X/week  PT Plan Current plan remains appropriate    Co-evaluation              AM-PAC PT "6 Clicks" Daily Activity  Outcome Measure  Difficulty turning over in bed (including adjusting bedclothes, sheets and blankets)?: A Little Difficulty moving from lying on back to sitting on the side of the bed? : A Lot Difficulty sitting down on and standing up from a chair with arms (e.g., wheelchair, bedside commode, etc,.)?: Unable Help needed moving to and from a bed to chair (including a wheelchair)?: A Little Help needed walking in hospital room?: A Little Help needed climbing 3-5 steps with a railing? : A Lot 6 Click Score: 14    End of Session  Equipment Utilized During Treatment: Gait belt Activity Tolerance: Patient tolerated treatment well Patient left: with call bell/phone within reach;in chair;with chair alarm set Nurse Communication: Mobility status PT Visit Diagnosis: Unsteadiness on feet (R26.81);Muscle weakness (generalized) (M62.81);Difficulty in walking, not elsewhere classified (R26.2)     Time: 0623-7628 PT Time Calculation (min) (ACUTE ONLY): 18 min  Charges:  $Gait Training: 8-22 mins                     Earney Navy, PTA Pager: [phone]     Darliss Cheney 02/03/2018, 3:20 PM

## 2018-02-03 NOTE — Progress Notes (Signed)
PROGRESS NOTE                                                                                                                                                                                                             Patient Demographics:    Tina Calderon, is a 77 y.o. female, DOB - September 13, 1940, MCE:[id number]  Admit date - 01/30/2018   Admitting Physician Ivor Costa, MD  Outpatient Primary MD for the patient is Tina Koch, NP  LOS - 0   Chief Complaint  Patient presents with  . Weakness       Brief Narrative   Tina Stonesifer Wimbishis a 77 y.o.femalewith medical history significant ofpulmonary Fibrosis, hyperlipidemia, asthma, GERD,dCHF, ILD, IPF, hypothyroidism, who presents with bilateral leg weakness and falls.     Subjective:    Tina Calderon today has, No headache, No chest pain, No abdominal pain - No Nausea, No new weakness tingling or numbness, No Cough - SOB.    Assessment  & Plan :    Principal Problem:   Leg weakness Active Problems:   Hyperlipidemia   IPF (idiopathic pulmonary fibrosis) (HCC)   Essential hypertension   GERD (gastroesophageal reflux disease)   Hypothyroidism   Thyroid nodule   Gait abnormality   Chronic diastolic congestive heart failure (HCC)   Acute blood loss anemia   Anemia of chronic disease  Bilateral leg weakness/hyperreflexia and increased tone -worse for 3-4days prior to admission, with mild muscle pain, tremors and weakness -Neurology following, MRI thoracic and cervical spine completed, questionable mild T-spine spinal cord dilatation, per Neuro incidental finding and unlikely to cause any symptoms -NEuro suspects some functional component -I suspect that she could be having mild Myalgias and muscle weakness from the Crestor that she was started over 1 month ago (12/18/2017), stopped Crestor -Physical therapy consulting, CIR vs SNF recommended, declined by CIR, awaiting SNF bed  availability -B-12 level is unremarkable -RPR is negative, CK normal -MRI with post contrast images completed last pm without acute findings, Neuro doesn't think that  T6-T8 dilated central cord is responsible for any of her symptoms -CSW consulted for Rehab  Hyperlipidemia: -Crestor stopped  IPF (idiopathic pulmonary fibrosis) ([place]): -continue Esbriet, has been on this for 4+ years -prn albuterol nebs  HTN:  -Continue home medications:Metoprolol -IV hydralazine prn  GERD: -Protonix  Hypothyroidism: Last  TSH was0.7on 01/19/2018 -Continue home Synthroid  Thyroid nodule: -MRI showedan incidental 2.5 cm LEFT thyroid nodule. -Her TSH was normal, 0.7 on8/05/2018 -Follow-up with PCP  History of palpitations, seeing cardiology -Was wearing an event monitor for the last 2 weeks -2-D echocardiogram-noted normal EF, wall motion, grade 1 diastolic dysfunction, dilated left atrium and right ventricle -follow-up with Dr. Al Pimple     Code Status : Full  Family Communication  : none at bedside  Disposition Plan  : SNF,   Consults  :  neuro  Procedures  : None  DVT Prophylaxis  :  ;ovenox  Lab Results  Component Value Date   PLT 220 01/31/2018     Anti-infectives (From admission, onward)   None        Objective:   Vitals:   02/02/18 2359 02/03/18 0348 02/03/18 0824 02/03/18 1156  BP: (!) 152/82 (!) 148/81 (!) 151/83 (!) 150/86  Pulse: 75 75 85 88  Resp: _0 Temp: 97.7 F (36.5 C) 98 F (36.7 C) 98.2 F (36.8 C) 98.3 F (36.8 C)  TempSrc: Oral Oral Oral Oral  SpO2: 96% 95% 98% 100%  Weight:      Height:        Wt Readings from Last 3 Encounters:  01/31/18 58.6 kg  01/30/18 60.1 kg  01/22/18 58.2 kg     Intake/Output Summary (Last 24 hours) at 02/03/2018 1536 Last data filed at 02/03/2018 1100 Gross per 24 hour  Intake 440 ml  Output -  Net 440 ml     Physical Exam  Awake Alert, Oriented X 3, No new F.N  deficits, Normal affect Symmetrical Chest wall movement, Good air movement bilaterally, CTAB RRR,No Gallops,Rubs or new Murmurs, No Parasternal Heave +ve B.Sounds, Abd Soft, No tenderness, No rebound - guarding or rigidity. No Cyanosis, Clubbing or edema, No new Rash or bruise      Data Review:    CBC Recent Labs  Lab 01/30/18 1604 01/31/18 0558  WBC 7.5 7.1  HGB 10.9* 10.6*  HCT 35.7* 33.4*  PLT 257 220  MCV 81.7 81.1  MCH 24.9* 25.7*  MCHC 30.5 31.7  RDW 14.8 14.8    Chemistries  Recent Labs  Lab 01/29/18 0759 01/30/18 1604 01/31/18 0337 01/31/18 0558  NA 138 138  --  137  K 4.3 4.4  --  3.8  CL 102 104  --  101  CO2 29 26  --  28  GLUCOSE 95 109*  --  102*  BUN 12 9  --  7*  CREATININE 0.95 0.82  --  0.83  CALCIUM 9.4 9.1  --  9.0  MG  --   --  2.0  --    ------------------------------------------------------------------------------------------------------------------ No results for input(s): CHOL, HDL, LDLCALC, TRIG, CHOLHDL, LDLDIRECT in the last 72 hours.  Lab Results  Component Value Date   HGBA1C 6.4 12/15/2017   ------------------------------------------------------------------------------------------------------------------ No results for input(s): TSH, T4TOTAL, T3FREE, THYROIDAB in the last 72 hours.  Invalid input(s): FREET3 ------------------------------------------------------------------------------------------------------------------ No results for input(s): VITAMINB12, FOLATE, FERRITIN, TIBC, IRON, RETICCTPCT in the last 72 hours.  Coagulation profile No results for input(s): INR, PROTIME in the last 168 hours.  No results for input(s): DDIMER in the last 72 hours.  Cardiac Enzymes No results for input(s): CKMB, TROPONINI, MYOGLOBIN in the last 168 hours.  Invalid input(s): CK ------------------------------------------------------------------------------------------------------------------    Component Value Date/Time   BNP 203.2  (H) 01/31/2018 0558    Inpatient Medications  Scheduled  Meds: . aspirin  81 mg Oral Daily  . calcium-vitamin D  1 tablet Oral q morning - 10a  . enoxaparin (LOVENOX) injection  40 mg Subcutaneous Q24H  . levothyroxine  88 mcg Oral QAC breakfast  . loratadine  10 mg Oral QPM  . metoprolol succinate  12.5 mg Oral Daily  . pantoprazole  40 mg Oral Daily  . Pirfenidone  801 mg Oral TID WC   Continuous Infusions: PRN Meds:.acetaminophen **OR** acetaminophen, albuterol, dextromethorphan-guaiFENesin, hydrALAZINE, ondansetron **OR** ondansetron (ZOFRAN) IV, senna-docusate, zolpidem  Micro Results No results found for this or any previous visit (from the past 240 hour(s)).  Radiology Reports Dg Chest 2 View  Result Date: 01/12/2018 CLINICAL DATA:  Onset of weakness and tachycardia in rehab. Patient reports not feeling well for the past 3 days. History of CHF and interstitial lung disease-pulmonary fibrosis, nonsmoker. EXAM: CHEST - 2 VIEW COMPARISON:  CT scan chest of November 28, 2017. FINDINGS: The lungs are adequately inflated. The interstitial markings are coarse bilaterally but stable. The heart is mildly enlarged. The pulmonary vascularity is not engorged. There is a large hiatal hernia-partially intrathoracic stomach. There is no pleural effusion. There is calcification in the wall of the aortic arch. The bony thorax is unremarkable. IMPRESSION: Chronic bronchitic-fibrotic changes, stable.  No acute pneumonia. Stable mild cardiomegaly without pulmonary vascular congestion. Thoracic aortic atherosclerosis. Electronically Signed   By: David  Martinique M.D.   On: 01/12/2018 11:40   Ct Head Wo Contrast  Result Date: 01/30/2018 CLINICAL DATA:  Syncopal episode EXAM: CT HEAD WITHOUT CONTRAST TECHNIQUE: Contiguous axial images were obtained from the base of the skull through the vertex without intravenous contrast. COMPARISON:  None. FINDINGS: Brain: No acute territorial infarction, hemorrhage or  intracranial mass. Mild atrophy. Small vessel ischemic changes of the white matter. Stable ventricle size. Vascular: No hyperdense vessels. Scattered calcifications at the carotid siphons. Skull: Normal. Negative for fracture or focal lesion. Sinuses/Orbits: No acute finding. Other: None IMPRESSION: No CT evidence for acute intracranial abnormality. Atrophy with small vessel ischemic changes of the white matter. Electronically Signed   By: Donavan Foil M.D.   On: 01/30/2018 19:04   Ct Angio Chest Pe W And/or Wo Contrast  Result Date: 01/12/2018 CLINICAL DATA:  Patient reports that her heart rate has felt irregular all weekend and that she just "hasn't felt good". Went to cardiac rehab this morning and her resting heart rate was 125. Patient denies history of irregular rhythm. History of pulmonary fibrosis. EXAM: CT ANGIOGRAPHY CHEST WITH CONTRAST TECHNIQUE: Multidetector CT imaging of the chest was performed using the standard protocol during bolus administration of intravenous contrast. Multiplanar CT image reconstructions and MIPs were obtained to evaluate the vascular anatomy. CONTRAST:  62m ISOVUE-370 IOPAMIDOL (ISOVUE-370) INJECTION 76% COMPARISON:  Chest CT dated 11/28/2017. FINDINGS: Cardiovascular: Some of the most peripheral segmental and subsegmental pulmonary arteries cannot be definitively characterized due to patient breathing motion artifact, however, there is no pulmonary embolism identified within the main, lobar or central segmental pulmonary arteries bilaterally. Cardiomegaly. No pericardial effusion. Coronary artery calcifications. Scattered aortic atherosclerosis. No thoracic aortic aneurysm or evidence of aortic dissection. Mediastinum/Nodes: Large hiatal hernia. No mass or enlarged lymph nodes seen within the mediastinum or perihilar regions. Trachea and central bronchi are unremarkable. Lungs/Pleura: Lungs are not significantly changed in appearance compared to the previous  high-resolution chest CT of 11/28/2017, compatible with the previous description of chronic interstitial lung disease (NSIP), perhaps slightly lower lung volumes accentuating the bibasilar markings with associated  bibasilar atelectasis. No evidence of pneumonia. No pleural effusion or pneumothorax. Upper Abdomen: No acute findings. Musculoskeletal: Mild degenerative spurring within the thoracic spine. No acute or suspicious osseous finding. Review of the MIP images confirms the above findings. IMPRESSION: 1. No acute findings. No pulmonary embolism seen. No evidence of pneumonia or pulmonary edema. 2. 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 3. Large hiatal hernia, stable. 4. Cardiomegaly. 5. Coronary artery calcifications. Aortic Atherosclerosis (ICD10-I70.0). Electronically Signed   By: Franki Cabot M.D.   On: 01/12/2018 14:00   Mr Cervical Spine Wo Contrast  Result Date: 01/31/2018 CLINICAL DATA:  Generalized weakness. History of cervical spine surgery, cancer. EXAM: MRI CERVICAL AND THORACIC SPINE WITHOUT CONTRAST TECHNIQUE: Multiplanar and multiecho pulse sequences of the cervical spine, to include the craniocervical junction and cervicothoracic junction, and thoracic spine, were obtained without intravenous contrast. COMPARISON:  CT HEAD January 30, 2018, MRI cervical spine January 02, 2009. FINDINGS: MRI CERVICAL SPINE FINDINGS ALIGNMENT: Straightened cervical lordosis. Grade 1 C3-4 anterolisthesis. VERTEBRAE/DISCS: Vertebral bodies are intact. Status post C5 through C7 ACDF with bridging bone marrow signal compatible with arthrodesis. Mild bone marrow edema LEFT C3-4 facets. Minimal C3-4 disc height loss with multilevel disc desiccation. CORD:Cervical spinal cord is normal morphology and signal characteristics. POSTERIOR FOSSA, VERTEBRAL ARTERIES, PARASPINAL TISSUES: No MR findings of  ligamentous injury. Vertebral artery flow voids present. Patchy pontine T2 hyperintensities compatible with chronic small vessel ischemic changes. DISC LEVELS: C2-3: No disc bulge, canal stenosis nor neural foraminal narrowing. Mild LEFT facet arthropathy. C3-4: Anterolisthesis. Severe LEFT facet arthropathy with effusion, likely reactive. Uncovertebral hypertrophy and small broad-based disc bulge. Mild canal stenosis. Mild RIGHT, moderate to severe LEFT neural foraminal narrowing. C4-5. Uncovertebral hypertrophy. Severe LEFT facet arthropathy. No canal stenosis. Mild LEFT neural foraminal narrowing. C5-6, C6-7: ACDF.  No canal stenosis or neural foraminal narrowing. C7-T1: No disc bulge, canal stenosis nor neural foraminal narrowing. Moderate facet arthropathy. MRI THORACIC SPINE FINDINGS ALIGNMENT: Maintenance of the thoracic kyphosis. No malalignment. VERTEBRAE/DISCS: Vertebral bodies are intact. Intervertebral discs morphology and signal are normal, multilevel mild chronic discogenic endplate changes. No abnormal or acute bone marrow signal. CORD: Approximately 3 cm segment of dilated central spinal canal T6 through T7-8, to 2 mm without cord edema. PREVERTEBRAL AND PARASPINAL SOFT TISSUES: Nonacute. 2.5 cm T2 bright LEFT thyroid nodule. DISC LEVELS: No disc bulge, canal stenosis nor neural foraminal narrowing. IMPRESSION: MRI cervical spine: 1. Grade 1 C3-4 anterolisthesis. Severe LEFT C3-4 facet arthropathy and acute reactive changes. 2. No fracture or acute osseous process. C5 through C7 ACDF with arthrodesis. 3. Degenerative change of the cervical spine. Mild canal stenosis C3-4. 4. Moderate to severe LEFT C3-4 neural foraminal narrowing. MRI thoracic spine: 1. No fracture, malalignment or acute osseous process. 2. Mildly dilated midthoracic central spinal canal, borderline findings for syrinx. 3. **An incidental finding of potential clinical significance has been found. 2.5 cm LEFT thyroid nodule. Recommend  NONEMERGENT thyroid sonogram. This follows ACR consensus guidelines: Managing Incidental Thyroid Nodules Detected on Imaging: White Paper of the ACR Incidental Thyroid Findings Committee. J Am Coll Radiol 2015; 12:143-150.** Electronically Signed   By: Elon Alas M.D.   On: 01/31/2018 01:29   Mr Cervical Spine W Contrast  Result Date: 02/02/2018 CLINICAL DATA:  77 y/o F; weakness in both legs for 3 weeks worsening today. Worsening left leg weakness. EXAM: MRI CERVICAL SPINE WITHOUT CONTRAST MRI THORACIC SPINE  WITHOUT CONTRAST TECHNIQUE: Multiplanar and multiecho pulse sequences of the cervical and thoracic spine were obtained without intravenous contrast. COMPARISON:  01/31/2018 cervical and thoracic spine MRI. FINDINGS: MRI CERVICAL SPINE FINDINGS Alignment: Straightening of cervical lordosis. Grade 1 C3-4 anterolisthesis. Vertebrae: No abnormal enhancement. C5-C7 ACDF. Susceptibility artifact from fusion hardware partially obscures the vertebral bodies at those levels. Cord: No abnormal enhancement. Posterior Fossa, vertebral arteries, paraspinal tissues: Negative. The thyroid gland is excluded from the field of view by fat saturation banding. MRI THORACIC SPINE FINDINGS Alignment:  Physiologic. Vertebrae: No abnormal enhancement. Cord:  T6-T8 dilated central spinal canal.  No abnormal enhancement. Paraspinal and other soft tissues: Negative. IMPRESSION: 1. No abnormal enhancement of the cervical or thoracic spine for spinal cord. 2. T6-T8 dilated central spinal canal. No associated enhancing spinal cord lesion. Electronically Signed   By: Kristine Garbe M.D.   On: 02/02/2018 01:34   Mr Thoracic Spine Wo Contrast  Result Date: 01/31/2018 CLINICAL DATA:  Generalized weakness. History of cervical spine surgery, cancer. EXAM: MRI CERVICAL AND THORACIC SPINE WITHOUT CONTRAST TECHNIQUE: Multiplanar and multiecho pulse sequences of the cervical spine, to include the craniocervical junction and  cervicothoracic junction, and thoracic spine, were obtained without intravenous contrast. COMPARISON:  CT HEAD January 30, 2018, MRI cervical spine January 02, 2009. FINDINGS: MRI CERVICAL SPINE FINDINGS ALIGNMENT: Straightened cervical lordosis. Grade 1 C3-4 anterolisthesis. VERTEBRAE/DISCS: Vertebral bodies are intact. Status post C5 through C7 ACDF with bridging bone marrow signal compatible with arthrodesis. Mild bone marrow edema LEFT C3-4 facets. Minimal C3-4 disc height loss with multilevel disc desiccation. CORD:Cervical spinal cord is normal morphology and signal characteristics. POSTERIOR FOSSA, VERTEBRAL ARTERIES, PARASPINAL TISSUES: No MR findings of ligamentous injury. Vertebral artery flow voids present. Patchy pontine T2 hyperintensities compatible with chronic small vessel ischemic changes. DISC LEVELS: C2-3: No disc bulge, canal stenosis nor neural foraminal narrowing. Mild LEFT facet arthropathy. C3-4: Anterolisthesis. Severe LEFT facet arthropathy with effusion, likely reactive. Uncovertebral hypertrophy and small broad-based disc bulge. Mild canal stenosis. Mild RIGHT, moderate to severe LEFT neural foraminal narrowing. C4-5. Uncovertebral hypertrophy. Severe LEFT facet arthropathy. No canal stenosis. Mild LEFT neural foraminal narrowing. C5-6, C6-7: ACDF.  No canal stenosis or neural foraminal narrowing. C7-T1: No disc bulge, canal stenosis nor neural foraminal narrowing. Moderate facet arthropathy. MRI THORACIC SPINE FINDINGS ALIGNMENT: Maintenance of the thoracic kyphosis. No malalignment. VERTEBRAE/DISCS: Vertebral bodies are intact. Intervertebral discs morphology and signal are normal, multilevel mild chronic discogenic endplate changes. No abnormal or acute bone marrow signal. CORD: Approximately 3 cm segment of dilated central spinal canal T6 through T7-8, to 2 mm without cord edema. PREVERTEBRAL AND PARASPINAL SOFT TISSUES: Nonacute. 2.5 cm T2 bright LEFT thyroid nodule. DISC LEVELS: No disc  bulge, canal stenosis nor neural foraminal narrowing. IMPRESSION: MRI cervical spine: 1. Grade 1 C3-4 anterolisthesis. Severe LEFT C3-4 facet arthropathy and acute reactive changes. 2. No fracture or acute osseous process. C5 through C7 ACDF with arthrodesis. 3. Degenerative change of the cervical spine. Mild canal stenosis C3-4. 4. Moderate to severe LEFT C3-4 neural foraminal narrowing. MRI thoracic spine: 1. No fracture, malalignment or acute osseous process. 2. Mildly dilated midthoracic central spinal canal, borderline findings for syrinx. 3. **An incidental finding of potential clinical significance has been found. 2.5 cm LEFT thyroid nodule. Recommend NONEMERGENT thyroid sonogram. This follows ACR consensus guidelines: Managing Incidental Thyroid Nodules Detected on Imaging: White Paper of the ACR Incidental Thyroid Findings Committee. J Am Coll Radiol 2015; 12:143-150.** Electronically Signed   By: Sandie Ano  Bloomer M.D.   On: 01/31/2018 01:29   Mr Thoracic Spine W Contrast  Result Date: 02/02/2018 CLINICAL DATA:  77 y/o F; weakness in both legs for 3 weeks worsening today. Worsening left leg weakness. EXAM: MRI CERVICAL SPINE WITHOUT CONTRAST MRI THORACIC SPINE WITHOUT CONTRAST TECHNIQUE: Multiplanar and multiecho pulse sequences of the cervical and thoracic spine were obtained without intravenous contrast. COMPARISON:  01/31/2018 cervical and thoracic spine MRI. FINDINGS: MRI CERVICAL SPINE FINDINGS Alignment: Straightening of cervical lordosis. Grade 1 C3-4 anterolisthesis. Vertebrae: No abnormal enhancement. C5-C7 ACDF. Susceptibility artifact from fusion hardware partially obscures the vertebral bodies at those levels. Cord: No abnormal enhancement. Posterior Fossa, vertebral arteries, paraspinal tissues: Negative. The thyroid gland is excluded from the field of view by fat saturation banding. MRI THORACIC SPINE FINDINGS Alignment:  Physiologic. Vertebrae: No abnormal enhancement. Cord:  T6-T8  dilated central spinal canal.  No abnormal enhancement. Paraspinal and other soft tissues: Negative. IMPRESSION: 1. No abnormal enhancement of the cervical or thoracic spine for spinal cord. 2. T6-T8 dilated central spinal canal. No associated enhancing spinal cord lesion. Electronically Signed   By: Kristine Garbe M.D.   On: 02/02/2018 01:34     Phillips Climes M.D on 02/03/2018 at 3:36 PM  Between 7am to 7pm - Pager - [phone]  After 7pm go to [email or web address] - password [facility]  [facility] -  Office  [phone]

## 2018-02-03 NOTE — Progress Notes (Signed)
Report called to [employer]

## 2018-02-03 NOTE — Care Management Note (Signed)
Case Management Note  Patient Details  Name: [name] MRN: [id number] Date of Birth: 1941/04/29  Subjective/Objective:                    Action/Plan: Pt discharging to SNF. CM signing off.   Expected Discharge Date:  02/03/18               Expected Discharge Plan:  Skilled Nursing Facility  In-House Referral:  Clinical Social Work  Discharge planning Services     Post Acute Care Choice:    Choice offered to:     DME Arranged:    DME Agency:     HH Arranged:    [place] Agency:     Status of Service:  Completed, signed off  If discussed at [employer], dates discussed:    Additional Comments:  Pollie Friar, RN 02/03/2018, 4:40 PM

## 2018-02-04 DIAGNOSIS — G729 Myopathy, unspecified: Secondary | ICD-10-CM | POA: Insufficient documentation

## 2018-02-04 NOTE — Telephone Encounter (Signed)
Attempted to call pt. I did not receive an answer. I have left a message for pt to return our call.

## 2018-02-05 ENCOUNTER — Telehealth: Payer: Self-pay

## 2018-02-05 DIAGNOSIS — J849 Interstitial pulmonary disease, unspecified: Secondary | ICD-10-CM

## 2018-02-05 NOTE — Telephone Encounter (Signed)
Spoke with pt. States that she is no longer in the hospital but she has been transferred to rehab facility. She will cancel her pulmonary rehab appointments until further notice. Nothing further was needed at this time.

## 2018-02-05 NOTE — Progress Notes (Signed)
Discharge Progress Report  Patient Details  Name: Tina Calderon MRN: [id number] Date of Birth: 08/20/1940 Referring Provider:     Pulmonary Rehab from 12/22/2017 in [facility]  Referring Provider  Court Joy MD       Number of Visits: 13/36  Reason for Discharge:  Early Exit:  Personal and health issues  Smoking History:  Social History   Tobacco Use  Smoking Status Never Smoker  Smokeless Tobacco Never Used    Diagnosis:  Interstitial lung disease ([place])  ADL [facility]: Pulmonary Assessment Scores    Row Name 12/22/17 1430         ADL [facility]   ADL Phase  Entry     SOB Score total  61     Rest  0     Walk  3     Stairs  4     Bath  3     Dress  2     Shop  3       CAT Score   CAT Score  14       mMRC Score   mMRC Score  3        Initial Exercise Prescription: Initial Exercise Prescription - 12/22/17 1600      Date of Initial Exercise RX and Referring Provider   Date  12/22/17    Referring Provider  Court Joy MD      Treadmill   MPH  2.5    Grade  0.5    Minutes  15    METs  3.09      NuStep   Level  2    SPM  80    Minutes  15    METs  2.5      REL-XR   Level  1    Speed  50    Minutes  15    METs  2.5      Prescription Details   Frequency (times per week)  3    Duration  Progress to 45 minutes of aerobic exercise without signs/symptoms of physical distress      Intensity   THRR 40-80% of Max Heartrate  114-133    Ratings of Perceived Exertion  11-13    Perceived Dyspnea  0-4      Progression   Progression  Continue to progress workloads to maintain intensity without signs/symptoms of physical distress.      Resistance Training   Training Prescription  Yes    Weight  3 lbs    Reps  10-15       Discharge Exercise Prescription (Final Exercise Prescription Changes): Exercise Prescription Changes - 02/04/18 1500      Response to Exercise   Blood Pressure (Admit)  124/62    Blood Pressure (Exit)   112/64    Heart Rate (Admit)  101 bpm    Heart Rate (Exercise)  125 bpm    Heart Rate (Exit)  84 bpm    Oxygen Saturation (Admit)  92 %    Oxygen Saturation (Exercise)  85 %   speed decreased to 1.5    Oxygen Saturation (Exit)  95 %    Rating of Perceived Exertion (Exercise)  13    Perceived Dyspnea (Exercise)  3    Duration  Continue with 45 min of aerobic exercise without signs/symptoms of physical distress.    Intensity  THRR unchanged      Progression   Progression  Continue to progress workloads to  maintain intensity without signs/symptoms of physical distress.    Average METs  2.4      Resistance Training   Training Prescription  Yes    Weight  3 lb    Reps  10-15      Interval Training   Interval Training  No      Treadmill   MPH  2.5    Grade  0.5    Minutes  15    METs  3.09      NuStep   Level  2    SPM  80    Minutes  15    METs  1.7      Home Exercise Plan   Plans to continue exercise at  [employer]    Frequency  Add 1 additional day to program exercise sessions.    Initial Home Exercises Provided  01/09/18       Functional Capacity: 6 Minute Walk    Row Name 12/22/17 1527         6 Minute Walk   Phase  Initial     Distance  1467 feet     Walk Time  6 minutes     # of Rest Breaks  0     MPH  2.78     METS  3.48     RPE  13     Perceived Dyspnea   2     VO2 Peak  12.17     Symptoms  Yes (comment)     Comments  SOB, leg fatigue in thighs (burning)     Resting HR  95 bpm     Resting BP  126/64     Resting Oxygen Saturation   96 %     Exercise Oxygen Saturation  during 6 min walk  87 %     Max Ex. HR  148 bpm     Max Ex. BP  144/66     2 Minute Post BP  124/68       Interval HR   1 Minute HR  126     2 Minute HR  139     3 Minute HR  145     4 Minute HR  148     5 Minute HR  144     6 Minute HR  143     2 Minute Post HR  109     Interval Heart Rate?  Yes       Interval Oxygen   Interval Oxygen?  Yes     Baseline Oxygen  Saturation %  96 %     1 Minute Oxygen Saturation %  91 %     1 Minute Liters of Oxygen  0 L Room Air     2 Minute Oxygen Saturation %  89 %     2 Minute Liters of Oxygen  0 L     3 Minute Oxygen Saturation %  88 %     3 Minute Liters of Oxygen  0 L     4 Minute Oxygen Saturation %  87 %     4 Minute Liters of Oxygen  0 L     5 Minute Oxygen Saturation %  87 %     5 Minute Liters of Oxygen  0 L     6 Minute Oxygen Saturation %  87 %     6 Minute Liters of Oxygen  0 L     2 Minute Post  Oxygen Saturation %  95 %     2 Minute Post Liters of Oxygen  0 L        Psychological, QOL, Others - Outcomes: PHQ 2/9: Depression screen [facility] 2/9 12/22/2017 12/15/2017 12/06/2016  Decreased Interest 1 0 0  Down, Depressed, Hopeless 1 0 0  PHQ - 2 Score 2 0 0  Altered sleeping 2 0 -  Tired, decreased energy 2 0 -  Change in appetite 2 0 -  Feeling bad or failure about yourself  1 0 -  Trouble concentrating 1 0 -  Moving slowly or fidgety/restless 0 0 -  Suicidal thoughts 0 0 -  PHQ-9 Score 10 0 -  Difficult doing work/chores Somewhat difficult Not difficult at all -    Quality of Life:   Personal Goals: Goals established at orientation with interventions provided to work toward goal. Personal Goals and Risk Factors at Admission - 12/22/17 1458      Core Components/Risk Factors/Patient Goals on Admission    Weight Management  Yes;Weight Maintenance    Intervention  Weight Management: Develop a combined nutrition and exercise program designed to reach desired caloric intake, while maintaining appropriate intake of nutrient and fiber, sodium and fats, and appropriate energy expenditure required for the weight goal.;Weight Management: Provide education and appropriate resources to help participant work on and attain dietary goals.;Weight Management/Obesity: Establish reasonable short term and long term weight goals.    Admit Weight  127 lb 11.2 oz (57.9 kg)    Goal Weight: Short Term  127 lb (57.6  kg)    Goal Weight: Long Term  127 lb (57.6 kg)    Expected Outcomes  Short Term: Continue to assess and modify interventions until short term weight is achieved;Long Term: Adherence to nutrition and physical activity/exercise program aimed toward attainment of established weight goal;Weight Maintenance: Understanding of the daily nutrition guidelines, which includes 25-35% calories from fat, 7% or less cal from saturated fats, less than 214m cholesterol, less than 1.5gm of sodium, & 5 or more servings of fruits and vegetables daily;Understanding recommendations for meals to include 15-35% energy as protein, 25-35% energy from fat, 35-60% energy from carbohydrates, less than 2026mof dietary cholesterol, 20-35 gm of total fiber daily;Understanding of distribution of calorie intake throughout the day with the consumption of 4-5 meals/snacks    Improve shortness of breath with ADL's  Yes    Intervention  Provide education, individualized exercise plan and daily activity instruction to help decrease symptoms of SOB with activities of daily living.    Expected Outcomes  Short Term: Improve cardiorespiratory fitness to achieve a reduction of symptoms when performing ADLs;Long Term: Be able to perform more ADLs without symptoms or delay the onset of symptoms    Hypertension  Yes    Intervention  Provide education on lifestyle modifcations including regular physical activity/exercise, weight management, moderate sodium restriction and increased consumption of fresh fruit, vegetables, and low fat dairy, alcohol moderation, and smoking cessation.;Monitor prescription use compliance.    Expected Outcomes  Short Term: Continued assessment and intervention until BP is < 140/9039mG in hypertensive participants. < 130/49m78m in hypertensive participants with diabetes, heart failure or chronic kidney disease.;Long Term: Maintenance of blood pressure at goal levels.        Personal Goals Discharge: Goals and Risk  Factor Review    Row Name 01/26/18 1617             Core Components/Risk Factors/Patient Goals Review   Personal Goals  Review  Weight Management/Obesity;Hypertension;Improve shortness of breath with ADL's       Review  Patients heart rate has been high and is wearing holter monitor today. She feels like she gets short of breath sometimes. Her ADL's have got a little better at home. Her doctor said she should take it easy while exercising to keep her heart rate down.       Expected Outcomes  Short: learn what is going on with her heart. Long: monitor heart rate independently.          Exercise Goals and Review: Exercise Goals    Row Name 12/22/17 1530             Exercise Goals   Increase Physical Activity  Yes       Intervention  Provide advice, education, support and counseling about physical activity/exercise needs.;Develop an individualized exercise prescription for aerobic and resistive training based on initial evaluation findings, risk stratification, comorbidities and participant's personal goals.       Expected Outcomes  Short Term: Attend rehab on a regular basis to increase amount of physical activity.;Long Term: Add in home exercise to make exercise part of routine and to increase amount of physical activity.;Long Term: Exercising regularly at least 3-5 days a week.       Increase Strength and Stamina  Yes       Intervention  Provide advice, education, support and counseling about physical activity/exercise needs.;Develop an individualized exercise prescription for aerobic and resistive training based on initial evaluation findings, risk stratification, comorbidities and participant's personal goals.       Expected Outcomes  Short Term: Perform resistance training exercises routinely during rehab and add in resistance training at home;Short Term: Increase workloads from initial exercise prescription for resistance, speed, and METs.;Long Term: Improve cardiorespiratory fitness,  muscular endurance and strength as measured by increased METs and functional capacity (6MWT)       Able to understand and use rate of perceived exertion (RPE) scale  Yes       Intervention  Provide education and explanation on how to use RPE scale       Expected Outcomes  Short Term: Able to use RPE daily in rehab to express subjective intensity level;Long Term:  Able to use RPE to guide intensity level when exercising independently       Able to understand and use Dyspnea scale  Yes       Intervention  Provide education and explanation on how to use Dyspnea scale       Expected Outcomes  Short Term: Able to use Dyspnea scale daily in rehab to express subjective sense of shortness of breath during exertion;Long Term: Able to use Dyspnea scale to guide intensity level when exercising independently       Knowledge and understanding of Target Heart Rate Range (THRR)  Yes       Intervention  Provide education and explanation of THRR including how the numbers were predicted and where they are located for reference       Expected Outcomes  Short Term: Able to state/look up THRR;Short Term: Able to use daily as guideline for intensity in rehab;Long Term: Able to use THRR to govern intensity when exercising independently       Able to check pulse independently  Yes       Intervention  Provide education and demonstration on how to check pulse in carotid and radial arteries.;Review the importance of being able to check your own pulse for  safety during independent exercise       Expected Outcomes  Short Term: Able to explain why pulse checking is important during independent exercise;Long Term: Able to check pulse independently and accurately       Understanding of Exercise Prescription  Yes       Intervention  Provide education, explanation, and written materials on patient's individual exercise prescription       Expected Outcomes  Long Term: Able to explain home exercise prescription to exercise  independently;Short Term: Able to explain program exercise prescription          Nutrition & Weight - Outcomes: Pre Biometrics - 12/22/17 1531      Pre Biometrics   Height  5' 2.1" (1.577 m)    Weight  127 lb 11.2 oz (57.9 kg)    Waist Circumference  30 inches    Hip Circumference  35 inches    Waist to Hip Ratio  0.86 %    BMI (Calculated)  23.29    Single Leg Stand  30 seconds        Nutrition: Nutrition Therapy & Goals - 01/26/18 1037      Nutrition Therapy   RD appointment deferred  Yes       Nutrition Discharge: Nutrition Assessments - 12/22/17 1434      MEDFICTS Scores   Pre Score  21       Education Questionnaire Score: Knowledge Questionnaire Score - 12/22/17 1435      Knowledge Questionnaire Score   Pre Score  17/18   reviewed with patient      Goals reviewed with patient; copy given to patient.

## 2018-02-05 NOTE — Telephone Encounter (Signed)
Tina Calderon reports her Dr wants her to discontinue LW and transfer to [facility] to get stronger. She will speak to her Dr about how to proceed when she finishes therapy.

## 2018-02-05 NOTE — Progress Notes (Signed)
Pulmonary Individual Treatment Plan  Patient Details  Name: Tina Calderon MRN: [id number] Date of Birth: [birth year] Referring Provider:     Pulmonary Rehab from 12/22/2017 in [facility] Cardiac and Pulmonary Rehab  Referring Provider  Court Joy MD      Initial Encounter Date:    Pulmonary Rehab from 12/22/2017 in [facility] Cardiac and Pulmonary Rehab  Date  12/22/17      Visit Diagnosis: Interstitial lung disease ([place])  Patient's Home Medications on Admission:  Current Outpatient Medications:  .  aspirin 81 MG tablet, Take 81 mg by mouth daily., Disp: , Rfl:  .  calcium-vitamin D (OSCAL WITH D) 500-200 MG-UNIT per tablet, Take 1 tablet by mouth every morning., Disp: , Rfl:  .  Cranberry 1000 MG CAPS, Take 1,000 mg by mouth daily. , Disp: , Rfl:  .  ESBRIET 267 MG CAPS, TAKE 3 CAPSULES BY MOUTH THREE TIMES DAILY WITH FOOD (Patient taking differently: Take 801 mg by mouth 3 (three) times daily. ), Disp: 270 capsule, Rfl: 3 .  levothyroxine (SYNTHROID, LEVOTHROID) 88 MCG tablet, TAKE 1 TABLET BY MOUTH EVERY DAY (Patient taking differently: Take 88 mcg by mouth daily before breakfast. ), Disp: 90 tablet, Rfl: 1 .  loratadine (CLARITIN) 10 MG tablet, Take 10 mg by mouth every evening. Reported on 10/13/2015, Disp: , Rfl:  .  metoprolol succinate (TOPROL-XL) 25 MG 24 hr tablet, Take 1 tablet (25 mg total) by mouth daily. (Patient taking differently: Take 12.5 mg by mouth daily. ), Disp: , Rfl:  .  Multiple Vitamin (MULTIVITAMIN WITH MINERALS) TABS, Take 1 tablet by mouth every morning. Centrum silver, Disp: , Rfl:  .  omeprazole (PRILOSEC) 20 MG capsule, TAKE 1 CAPSULE (20 MG TOTAL) BY MOUTH DAILY. (Patient taking differently: Take 20 mg by mouth daily. ), Disp: 90 capsule, Rfl: 1  Past Medical History: Past Medical History:  Diagnosis Date  . Anginal pain ([place])   . Cancer ([place])   . CHF (congestive heart failure) ([place]) 01/22/2012  . Chronic fatigue 07/15/2014  . GERD (gastroesophageal reflux  disease) 05/12/2017  . Hypertension   . Hypothyroidism   . ILD (interstitial lung disease) ([place]) 02/04/2012  . IPF (idiopathic pulmonary fibrosis) ([place]) 2013  . Mild intermittent asthma 01/29/2016  . Pelvic relaxation   . Prolapse of bladder   . Shortness of breath   . Thyroid disease     Tobacco Use: Social History   Tobacco Use  Smoking Status Never Smoker  Smokeless Tobacco Never Used    Labs: Recent Review Flowsheet Data    Labs for ITP Cardiac and Pulmonary Rehab Latest Ref Rng & Units 01/21/2012 12/06/2016 12/16/2016 06/18/2017 12/15/2017   Cholestrol 0 - 200 mg/dL 192 237(H) - 229(H) 239(H)   LDLCALC 0 - 99 mg/dL 98 154(H) - 138(H) 149(H)   HDL >39.00 mg/dL 46 51.90 - 51.70 55.10   Trlycerides 0.0 - 149.0 mg/dL 239(H) 152.0(H) - 193.0(H) 175.0(H)   Hemoglobin A1c 4.6 - 6.5 % - - 6.3 6.4 6.4       Pulmonary Assessment Scores: Pulmonary Assessment Scores    Row Name 12/22/17 1430         ADL UCSD   ADL Phase  Entry     SOB Score total  61     Rest  0     Walk  3     Stairs  4     Bath  3     Dress  2  Shop  3       CAT Score   CAT Score  14       mMRC Score   mMRC Score  3        Pulmonary Function Assessment: Pulmonary Function Assessment - 12/22/17 1451      Breath   Bilateral Breath Sounds  Clear    Shortness of Breath  Limiting activity;Yes       Exercise Target Goals: Exercise Program Goal: Individual exercise prescription set using results from initial 6 min walk test and THRR while considering  patient's activity barriers and safety.   Exercise Prescription Goal: Initial exercise prescription builds to 30-45 minutes a day of aerobic activity, 2-3 days per week.  Home exercise guidelines will be given to patient during program as part of exercise prescription that the participant will acknowledge.  Activity Barriers & Risk Stratification: Activity Barriers & Cardiac Risk Stratification - 12/22/17 1529      Activity Barriers & Cardiac Risk  Stratification   Activity Barriers  Deconditioning;Muscular Weakness;Shortness of Breath       6 Minute Walk: 6 Minute Walk    Row Name 12/22/17 1527         6 Minute Walk   Phase  Initial     Distance  1467 feet     Walk Time  6 minutes     # of Rest Breaks  0     MPH  2.78     METS  3.48     RPE  13     Perceived Dyspnea   2     VO2 Peak  12.17     Symptoms  Yes (comment)     Comments  SOB, leg fatigue in thighs (burning)     Resting HR  95 bpm     Resting BP  126/64     Resting Oxygen Saturation   96 %     Exercise Oxygen Saturation  during 6 min walk  87 %     Max Ex. HR  148 bpm     Max Ex. BP  144/66     2 Minute Post BP  124/68       Interval HR   1 Minute HR  126     2 Minute HR  139     3 Minute HR  145     4 Minute HR  148     5 Minute HR  144     6 Minute HR  143     2 Minute Post HR  109     Interval Heart Rate?  Yes       Interval Oxygen   Interval Oxygen?  Yes     Baseline Oxygen Saturation %  96 %     1 Minute Oxygen Saturation %  91 %     1 Minute Liters of Oxygen  0 L Room Air     2 Minute Oxygen Saturation %  89 %     2 Minute Liters of Oxygen  0 L     3 Minute Oxygen Saturation %  88 %     3 Minute Liters of Oxygen  0 L     4 Minute Oxygen Saturation %  87 %     4 Minute Liters of Oxygen  0 L     5 Minute Oxygen Saturation %  87 %     5 Minute Liters of Oxygen  0 L  6 Minute Oxygen Saturation %  87 %     6 Minute Liters of Oxygen  0 L     2 Minute Post Oxygen Saturation %  95 %     2 Minute Post Liters of Oxygen  0 L       Oxygen Initial Assessment: Oxygen Initial Assessment - 12/22/17 1455      Home Oxygen   Home Oxygen Device  None    Sleep Oxygen Prescription  None    Home Exercise Oxygen Prescription  None    Home at Rest Exercise Oxygen Prescription  None      Initial 6 min Walk   Oxygen Used  None      Program Oxygen Prescription   Program Oxygen Prescription  None      Intervention   Short Term Goals  To learn  and understand importance of monitoring SPO2 with pulse oximeter and demonstrate accurate use of the pulse oximeter.;To learn and understand importance of maintaining oxygen saturations>88%;To learn and demonstrate proper pursed lip breathing techniques or other breathing techniques.;To learn and demonstrate proper use of respiratory medications    Long  Term Goals  Verbalizes importance of monitoring SPO2 with pulse oximeter and return demonstration;Maintenance of O2 saturations>88%;Exhibits proper breathing techniques, such as pursed lip breathing or other method taught during program session;Compliance with respiratory medication;Demonstrates proper use of MDI's       Oxygen Re-Evaluation: Oxygen Re-Evaluation    Row Name 12/29/17 1046 01/26/18 1611           Program Oxygen Prescription   Program Oxygen Prescription  None  None        Home Oxygen   Home Oxygen Device  None  None      Sleep Oxygen Prescription  None  None      Home Exercise Oxygen Prescription  None  None      Home at Rest Exercise Oxygen Prescription  None  None        Goals/Expected Outcomes   Short Term Goals  To learn and understand importance of monitoring SPO2 with pulse oximeter and demonstrate accurate use of the pulse oximeter.;To learn and understand importance of maintaining oxygen saturations>88%;To learn and demonstrate proper pursed lip breathing techniques or other breathing techniques.;To learn and demonstrate proper use of respiratory medications  To learn and understand importance of monitoring SPO2 with pulse oximeter and demonstrate accurate use of the pulse oximeter.;To learn and understand importance of maintaining oxygen saturations>88%;To learn and demonstrate proper pursed lip breathing techniques or other breathing techniques.      Long  Term Goals  Verbalizes importance of monitoring SPO2 with pulse oximeter and return demonstration;Maintenance of O2 saturations>88%;Exhibits proper breathing  techniques, such as pursed lip breathing or other method taught during program session;Compliance with respiratory medication;Demonstrates proper use of MDI's  Verbalizes importance of monitoring SPO2 with pulse oximeter and return demonstration;Maintenance of O2 saturations>88%;Exhibits proper breathing techniques, such as pursed lip breathing or other method taught during program session;Compliance with respiratory medication      Comments  Reviewed PLB technique with pt.  Talked about how it work and it's important to maintaining his exercise saturations.    Patient has been doing well in class with her PLB. She uses PLB at home and when she gets short of breath. She has been monitoring her oxygen at home but not on exertion. Informed patient to check on her oxygen after she does strenuous activities.      Goals/Expected Outcomes  Short: Become more profiecient at using PLB.   Long: Become independent at using PLB.  Short: monitor oxygen at home with exertion. Long: maintain oxygen saturations above 88 percent independently.         Oxygen Discharge (Final Oxygen Re-Evaluation): Oxygen Re-Evaluation - 01/26/18 1611      Program Oxygen Prescription   Program Oxygen Prescription  None      Home Oxygen   Home Oxygen Device  None    Sleep Oxygen Prescription  None    Home Exercise Oxygen Prescription  None    Home at Rest Exercise Oxygen Prescription  None      Goals/Expected Outcomes   Short Term Goals  To learn and understand importance of monitoring SPO2 with pulse oximeter and demonstrate accurate use of the pulse oximeter.;To learn and understand importance of maintaining oxygen saturations>88%;To learn and demonstrate proper pursed lip breathing techniques or other breathing techniques.    Long  Term Goals  Verbalizes importance of monitoring SPO2 with pulse oximeter and return demonstration;Maintenance of O2 saturations>88%;Exhibits proper breathing techniques, such as pursed lip breathing  or other method taught during program session;Compliance with respiratory medication    Comments  Patient has been doing well in class with her PLB. She uses PLB at home and when she gets short of breath. She has been monitoring her oxygen at home but not on exertion. Informed patient to check on her oxygen after she does strenuous activities.    Goals/Expected Outcomes  Short: monitor oxygen at home with exertion. Long: maintain oxygen saturations above 88 percent independently.       Initial Exercise Prescription: Initial Exercise Prescription - 12/22/17 1600      Date of Initial Exercise RX and Referring Provider   Date  12/22/17    Referring Provider  Court Joy MD      Treadmill   MPH  2.5    Grade  0.5    Minutes  15    METs  3.09      NuStep   Level  2    SPM  80    Minutes  15    METs  2.5      REL-XR   Level  1    Speed  50    Minutes  15    METs  2.5      Prescription Details   Frequency (times per week)  3    Duration  Progress to 45 minutes of aerobic exercise without signs/symptoms of physical distress      Intensity   THRR 40-80% of Max Heartrate  114-133    Ratings of Perceived Exertion  11-13    Perceived Dyspnea  0-4      Progression   Progression  Continue to progress workloads to maintain intensity without signs/symptoms of physical distress.      Resistance Training   Training Prescription  Yes    Weight  3 lbs    Reps  10-15       Perform Capillary Blood Glucose checks as needed.  Exercise Prescription Changes: Exercise Prescription Changes    Row Name 12/22/17 1500 01/08/18 1000 01/09/18 1200 01/22/18 1100 02/04/18 1500     Response to Exercise   Blood Pressure (Admit)  126/64  122/66  122/66  112/62  124/62   Blood Pressure (Exercise)  144/66  138/68  138/68  -  -   Blood Pressure (Exit)  124/68  126/74  126/74  110/70  112/64  Heart Rate (Admit)  95 bpm  91 bpm  91 bpm  96 bpm  101 bpm   Heart Rate (Exercise)  148 bpm  137 bpm   137 bpm  124 bpm  125 bpm   Heart Rate (Exit)  109 bpm  106 bpm  106 bpm  105 bpm  84 bpm   Oxygen Saturation (Admit)  96 %  96 %  96 %  95 %  92 %   Oxygen Saturation (Exercise)  87 %  87 %  87 %  88 %  85 % speed decreased to 1.5    Oxygen Saturation (Exit)  95 %  97 %  97 %  96 %  95 %   Rating of Perceived Exertion (Exercise)  _0 Perceived Dyspnea (Exercise)  _1 Symptoms  SOB, leg fatigue in thighs (burning)  -  -  -  -   Comments  walk test results  -  -  -  -   Duration  -  Progress to 45 minutes of aerobic exercise without signs/symptoms of physical distress  Progress to 45 minutes of aerobic exercise without signs/symptoms of physical distress  Continue with 45 min of aerobic exercise without signs/symptoms of physical distress.  Continue with 45 min of aerobic exercise without signs/symptoms of physical distress.   Intensity  -  THRR unchanged  THRR unchanged  THRR unchanged  THRR unchanged     Progression   Progression  -  Continue to progress workloads to maintain intensity without signs/symptoms of physical distress.  Continue to progress workloads to maintain intensity without signs/symptoms of physical distress.  Continue to progress workloads to maintain intensity without signs/symptoms of physical distress.  Continue to progress workloads to maintain intensity without signs/symptoms of physical distress.   Average METs  -  2.65  2.65  2.86  2.4     Resistance Training   Training Prescription  -  Yes  Yes  Yes  Yes   Weight  -  3 lb  3 lb  3 lb  3 lb   Reps  -  10-15  10-15  10-15  10-15     Interval Training   Interval Training  -  -  -  No  No     Treadmill   MPH  -  2.5  2.5  2.5  2.5   Grade  -  0.5  0.5  0.5  0.5   Minutes  -  _2 METs  -  3.09  3.09  3.09  3.09     NuStep   Level  -  _3 SPM  -  80  80  80  80   Minutes  -  _4 METs  -  2.2  2.2  1.9  1.7     REL-XR   Level  -  -  -  1  -    Speed  -  -  -  50  -   Minutes  -  -  -  15  -   METs  -  -  -  3.6  -     Home Exercise Plan   Plans to continue exercise at  -  -  [facility]   Frequency  -  -  Add 1 additional day to program exercise sessions.  Add 1 additional day to program exercise sessions.  Add 1 additional day to program exercise sessions.   Initial Home Exercises Provided  -  -  01/09/18  01/09/18  01/09/18      Exercise Comments: Exercise Comments    Row Name 12/29/17 1048 01/09/18 1208         Exercise Comments   First full day of exercise!  Patient was oriented to gym and equipment including functions, settings, policies, and procedures.  Patient's individual exercise prescription and treatment plan were reviewed.  All starting workloads were established based on the results of the 6 minute walk test done at initial orientation visit.  The plan for exercise progression was also introduced and progression will be customized based on patient's performance and goals.  Reviewed home exercise with pt today.  Pt plans to walk and consider [place] for exercise.  Reviewed THR, pulse, RPE, sign and symptoms, NTG use, and when to call 911 or MD.  Also discussed weather considerations and indoor options.  Pt voiced understanding.         Exercise Goals and Review: Exercise Goals    Row Name 12/22/17 1530             Exercise Goals   Increase Physical Activity  Yes       Intervention  Provide advice, education, support and counseling about physical activity/exercise needs.;Develop an individualized exercise prescription for aerobic and resistive training based on initial evaluation findings, risk stratification, comorbidities and participant's personal goals.       Expected Outcomes  Short Term: Attend rehab on a regular basis to increase amount of physical activity.;Long Term: Add in home exercise to make exercise part of routine and to increase amount of physical activity.;Long  Term: Exercising regularly at least 3-5 days a week.       Increase Strength and Stamina  Yes       Intervention  Provide advice, education, support and counseling about physical activity/exercise needs.;Develop an individualized exercise prescription for aerobic and resistive training based on initial evaluation findings, risk stratification, comorbidities and participant's personal goals.       Expected Outcomes  Short Term: Perform resistance training exercises routinely during rehab and add in resistance training at home;Short Term: Increase workloads from initial exercise prescription for resistance, speed, and METs.;Long Term: Improve cardiorespiratory fitness, muscular endurance and strength as measured by increased METs and functional capacity (6MWT)       Able to understand and use rate of perceived exertion (RPE) scale  Yes       Intervention  Provide education and explanation on how to use RPE scale       Expected Outcomes  Short Term: Able to use RPE daily in rehab to express subjective intensity level;Long Term:  Able to use RPE to guide intensity level when exercising independently       Able to understand and use Dyspnea scale  Yes       Intervention  Provide education and explanation on how to use Dyspnea scale       Expected Outcomes  Short Term: Able to use Dyspnea scale daily in rehab to express subjective sense of shortness of breath during exertion;Long Term: Able to use Dyspnea scale to guide intensity level when exercising independently       Knowledge and understanding of  Target Heart Rate Range (THRR)  Yes       Intervention  Provide education and explanation of THRR including how the numbers were predicted and where they are located for reference       Expected Outcomes  Short Term: Able to state/look up THRR;Short Term: Able to use daily as guideline for intensity in rehab;Long Term: Able to use THRR to govern intensity when exercising independently       Able to check pulse  independently  Yes       Intervention  Provide education and demonstration on how to check pulse in carotid and radial arteries.;Review the importance of being able to check your own pulse for safety during independent exercise       Expected Outcomes  Short Term: Able to explain why pulse checking is important during independent exercise;Long Term: Able to check pulse independently and accurately       Understanding of Exercise Prescription  Yes       Intervention  Provide education, explanation, and written materials on patient's individual exercise prescription       Expected Outcomes  Long Term: Able to explain home exercise prescription to exercise independently;Short Term: Able to explain program exercise prescription          Exercise Goals Re-Evaluation : Exercise Goals Re-Evaluation    Row Name 12/29/17 1044 01/08/18 1042 01/09/18 1208 01/22/18 1127 02/04/18 1536     Exercise Goal Re-Evaluation   Exercise Goals Review  Increase Physical Activity;Increase Strength and Stamina;Able to understand and use rate of perceived exertion (RPE) scale;Able to check pulse independently;Knowledge and understanding of Target Heart Rate Range (THRR);Able to understand and use Dyspnea scale;Understanding of Exercise Prescription  Increase Physical Activity;Able to understand and use rate of perceived exertion (RPE) scale;Increase Strength and Stamina;Able to understand and use Dyspnea scale  Increase Physical Activity;Increase Strength and Stamina;Able to understand and use rate of perceived exertion (RPE) scale;Able to understand and use Dyspnea scale;Knowledge and understanding of Target Heart Rate Range (THRR);Able to check pulse independently;Understanding of Exercise Prescription  Increase Physical Activity;Increase Strength and Stamina;Able to understand and use rate of perceived exertion (RPE) scale;Able to understand and use Dyspnea scale  Increase Physical Activity;Increase Strength and Stamina;Able  to understand and use rate of perceived exertion (RPE) scale;Able to understand and use Dyspnea scale   Comments  Reviewed RPE scale, THR and program prescription with pt today.  Pt voiced understanding and was given a copy of goals to take home.   Tina Calderon is tolerating exercise well.  She seems to enjoy participating with the group.  Reviewed home exercise with pt today.  Pt plans to walk and consider [place] for exercise.  Reviewed THR, pulse, RPE, sign and symptoms, NTG use, and when to call 911 or MD.  Also discussed weather considerations and indoor options.  Pt voiced understanding.  Tina Calderon has progressed well with exercise and has increased MET level.  Staff will monitor progress.  Staff discussed with Tina Calderon exercising on her own as she will miss some days due to other Dr appointments.  Tina Calderon has missed some sessions due to Dr appointments for herself and her husband.  She tolerates exercise well when she attends.   Expected Outcomes  Short: Use RPE daily to regulate intensity. Long: Follow program prescription in THR.  Short - continue to attend 3 days per week Long - increase MET level  Short - [place] will walk with her husband once per week outside [facility] classes Long -  [place] exercise on her own - staff discussed Psychologist, occupational - continue to attend consistently Long - improve MET level  Short - exercise on her own when she cant attend Long - Increase overall MET level      Discharge Exercise Prescription (Final Exercise Prescription Changes): Exercise Prescription Changes - 02/04/18 1500      Response to Exercise   Blood Pressure (Admit)  124/62    Blood Pressure (Exit)  112/64    Heart Rate (Admit)  101 bpm    Heart Rate (Exercise)  125 bpm    Heart Rate (Exit)  84 bpm    Oxygen Saturation (Admit)  92 %    Oxygen Saturation (Exercise)  85 %   speed decreased to 1.5    Oxygen Saturation (Exit)  95 %    Rating of Perceived Exertion (Exercise)  13    Perceived Dyspnea  (Exercise)  3    Duration  Continue with 45 min of aerobic exercise without signs/symptoms of physical distress.    Intensity  THRR unchanged      Progression   Progression  Continue to progress workloads to maintain intensity without signs/symptoms of physical distress.    Average METs  2.4      Resistance Training   Training Prescription  Yes    Weight  3 lb    Reps  10-15      Interval Training   Interval Training  No      Treadmill   MPH  2.5    Grade  0.5    Minutes  15    METs  3.09      NuStep   Level  2    SPM  80    Minutes  15    METs  1.7      Home Exercise Plan   Plans to continue exercise at  [employer]    Frequency  Add 1 additional day to program exercise sessions.    Initial Home Exercises Provided  01/09/18       Nutrition:  Target Goals: Understanding of nutrition guidelines, daily intake of sodium <1545m, cholesterol <2055m calories 30% from fat and 7% or less from saturated fats, daily to have 5 or more servings of fruits and vegetables.  Biometrics: Pre Biometrics - 12/22/17 1531      Pre Biometrics   Height  5' 2.1" (1.577 m)    Weight  127 lb 11.2 oz (57.9 kg)    Waist Circumference  30 inches    Hip Circumference  35 inches    Waist to Hip Ratio  0.86 %    BMI (Calculated)  23.29    Single Leg Stand  30 seconds        Nutrition Therapy Plan and Nutrition Goals: Nutrition Therapy & Goals - 01/26/18 1037      Nutrition Therapy   RD appointment deferred  Yes       Nutrition Assessments: Nutrition Assessments - 12/22/17 1434      MEDFICTS Scores   Pre Score  21       Nutrition Goals Re-Evaluation:   Nutrition Goals Discharge (Final Nutrition Goals Re-Evaluation):   Psychosocial: Target Goals: Acknowledge presence or absence of significant depression and/or stress, maximize coping skills, provide positive support system. Participant is able to verbalize types and ability to use techniques and skills needed for reducing  stress and depression.   Initial Review & Psychosocial Screening: Initial Psych Review & Screening -  12/22/17 1446      Initial Review   Current issues with  Current Sleep Concerns;Current Stress Concerns    Source of Stress Concerns  Chronic Illness;Unable to perform yard/household activities    Comments  Her house work is hard to due with her shortness of breath. Her ILD is making it stressful on her. 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      Family Dynamics   Good Support System?  Yes    Comments  She can look to her sister and daughter. She does a therapy group for pulmonary fibrosis.       Barriers   Psychosocial barriers to participate in program  The patient should benefit from training in stress management and relaxation.      Screening Interventions   Interventions  To provide support and resources with identified psychosocial needs;Encouraged to exercise;Program counselor consult;Provide feedback about the scores to participant    Expected Outcomes  Short Term goal: Utilizing psychosocial counselor, staff and physician to assist with identification of specific Stressors or current issues interfering with healing process. Setting desired goal for each stressor or current issue identified.;Long Term Goal: Stressors or current issues are controlled or eliminated.;Short Term goal: Identification and review with participant of any Quality of Life or Depression concerns found by scoring the questionnaire.;Long Term goal: The participant improves quality of Life and PHQ9 Scores as seen by post scores and/or verbalization of changes       Quality of Life Scores:  Scores of 19 and below usually indicate a poorer quality of life in these areas.  A difference of  2-3 points is a clinically meaningful difference.  A difference of 2-3 points in the total score of the Quality of Life Index has been associated with significant improvement in overall quality of life, self-image,  physical symptoms, and general health in studies assessing change in quality of life.  PHQ-9: Recent Review Flowsheet Data    Depression screen [facility] 2/9 12/22/2017 12/15/2017 12/06/2016   Decreased Interest 1 0 0   Down, Depressed, Hopeless 1 0 0   PHQ - 2 Score 2 0 0   Altered sleeping 2 0 -   Tired, decreased energy 2 0 -   Change in appetite 2 0 -   Feeling bad or failure about yourself  1 0 -   Trouble concentrating 1 0 -   Moving slowly or fidgety/restless 0 0 -   Suicidal thoughts 0 0 -   PHQ-9 Score 10 0 -   Difficult doing work/chores Somewhat difficult Not difficult at all -     Interpretation of Total Score  Total Score Depression Severity:  1-4 = Minimal depression, 5-9 = Mild depression, 10-14 = Moderate depression, 15-19 = Moderately severe depression, 20-27 = Severe depression   Psychosocial Evaluation and Intervention: Psychosocial Evaluation - 12/29/17 1034      Psychosocial Evaluation & Interventions   Interventions  Encouraged to exercise with the program and follow exercise prescription    Comments  Counselor met with Ms. [place] Tina Calderon) today for initial psychosocial evaluation.  She is a 77 year old who has had Pulmonary Fibrosis 6 years and this is the 2nd time in this program during that period of time.  Sophiarose has a very strong support system with a spouse of 28 years; (2) adult children who live locally; extended family and active involvement in her local church.  Cantrell reports sleeping well most of the time (occasionally has  trouble falling asleep).  Her appetite is good.  Ramon denies a history of depression or anxiety or any current symptoms and states her mood is positive most of the time.  She has minimal stress in her life other than her health and that of her spouse - who has COPD and is confined to a wheelchair.  Lollie has goals to increase her stamina and stability and would like to improve her breathing while in this program.  She mentioned losing "a little  weight" but this is not a primary goals for her.  Staff will follow with her.    Expected Outcomes  Short:  Gladiola will exercise consistently for her health and stress.  She will meet with the dietician for any weight loss considerations she has.    Long:  Silvana will practice the principles and strategies taught while in this program in the future to manage her condition more positively.      Continue Psychosocial Services   Follow up required by staff       Psychosocial Re-Evaluation: Psychosocial Re-Evaluation    [place] Name 01/26/18 1614             Psychosocial Re-Evaluation   Current issues with  Current Stress Concerns       Comments  Patient states that everything is going well and she does not have much stress in her life. Her husband has some health issues and is the only thing that stresses her out. Her breathing has been getting a little better since the start of the program.       Expected Outcomes  Short: Attend [facility] stress management education to decrease stress. Long: Maintain exercise Post [facility] to keep stress at a minimum.       Interventions  Encouraged to attend Pulmonary Rehabilitation for the exercise       Continue Psychosocial Services   Follow up required by staff          Psychosocial Discharge (Final Psychosocial Re-Evaluation): Psychosocial Re-Evaluation - 01/26/18 1614      Psychosocial Re-Evaluation   Current issues with  Current Stress Concerns    Comments  Patient states that everything is going well and she does not have much stress in her life. Her husband has some health issues and is the only thing that stresses her out. Her breathing has been getting a little better since the start of the program.    Expected Outcomes  Short: Attend [facility] stress management education to decrease stress. Long: Maintain exercise Post [facility] to keep stress at a minimum.    Interventions  Encouraged to attend Pulmonary Rehabilitation for the exercise     Continue Psychosocial Services   Follow up required by staff       Education: Education Goals: Education classes will be provided on a weekly basis, covering required topics. Participant will state understanding/return demonstration of topics presented.  Learning Barriers/Preferences: Learning Barriers/Preferences - 12/22/17 1452      Learning Barriers/Preferences   Learning Barriers  Sight   wears glasses   Learning Preferences  None       Education Topics:  Initial Evaluation Education: - Verbal, written and demonstration of respiratory meds, oximetry and breathing techniques. Instruction on use of nebulizers and MDIs and importance of monitoring MDI activations.   Pulmonary Rehab from 01/28/2018 in [facility] Cardiac and Pulmonary Rehab  Date  12/22/17  Educator  [facility]  Instruction Review Code  1- Verbalizes Understanding      General Nutrition  Guidelines/Fats and Fiber: -Group instruction provided by verbal, written material, models and posters to present the general guidelines for heart healthy nutrition. Gives an explanation and review of dietary fats and fiber.   Controlling Sodium/Reading Food Labels: -Group verbal and written material supporting the discussion of sodium use in heart healthy nutrition. Review and explanation with models, verbal and written materials for utilization of the food label.   Pulmonary Rehab from 01/28/2018 in [facility] Cardiac and Pulmonary Rehab  Date  12/29/17  Educator  [facility]  Instruction Review Code  1- Verbalizes Understanding      Exercise Physiology & General Exercise Guidelines: - Group verbal and written instruction with models to review the exercise physiology of the cardiovascular system and associated critical values. Provides general exercise guidelines with specific guidelines to those with heart or lung disease.    Aerobic Exercise & Resistance Training: - Gives group verbal and written instruction on the various components of exercise.  Focuses on aerobic and resistive training programs and the benefits of this training and how to safely progress through these programs.   Flexibility, Balance, Mind/Body Relaxation: Provides group verbal/written instruction on the benefits of flexibility and balance training, including mind/body exercise modes such as yoga, pilates and tai chi.  Demonstration and skill practice provided.   Pulmonary Rehab from 01/28/2018 in [facility] Cardiac and Pulmonary Rehab  Date  01/09/18  Educator  AS  Instruction Review Code  1- Verbalizes Understanding      Stress and Anxiety: - Provides group verbal and written instruction about the health risks of elevated stress and causes of high stress.  Discuss the correlation between heart/lung disease and anxiety and treatment options. Review healthy ways to manage with stress and anxiety.   Pulmonary Rehab from 01/28/2018 in [facility] Cardiac and Pulmonary Rehab  Date  01/14/18  Educator  [facility]  Instruction Review Code  1- Verbalizes Understanding      Depression: - Provides group verbal and written instruction on the correlation between heart/lung disease and depressed mood, treatment options, and the stigmas associated with seeking treatment.   Exercise & Equipment Safety: - Individual verbal instruction and demonstration of equipment use and safety with use of the equipment.   Pulmonary Rehab from 01/28/2018 in [facility] Cardiac and Pulmonary Rehab  Date  12/22/17  Educator  [facility]  Instruction Review Code  1- Verbalizes Understanding      Infection Prevention: - Provides verbal and written material to individual with discussion of infection control including proper hand washing and proper equipment cleaning during exercise session.   Pulmonary Rehab from 01/28/2018 in [facility] Cardiac and Pulmonary Rehab  Date  12/22/17  Educator  [facility]  Instruction Review Code  1- Verbalizes Understanding      Falls Prevention: - Provides verbal and written material to individual  with discussion of falls prevention and safety.   Pulmonary Rehab from 01/28/2018 in [facility] Cardiac and Pulmonary Rehab  Date  12/22/17  Educator  [facility]  Instruction Review Code  1- Verbalizes Understanding      Diabetes: - Individual verbal and written instruction to review signs/symptoms of diabetes, desired ranges of glucose level fasting, after meals and with exercise. Advice that pre and post exercise glucose checks will be done for 3 sessions at entry of program.   Chronic Lung Diseases: - Group verbal and written instruction to review updates, respiratory medications, advancements in procedures and treatments. Discuss use of supplemental oxygen including available portable oxygen systems, continuous and intermittent flow rates, concentrators, personal use  and safety guidelines. Review proper use of inhaler and spacers. Provide informative websites for self-education.    Energy Conservation: - Provide group verbal and written instruction for methods to conserve energy, plan and organize activities. Instruct on pacing techniques, use of adaptive equipment and posture/positioning to relieve shortness of breath.   Pulmonary Rehab from 01/28/2018 in [facility] Cardiac and Pulmonary Rehab  Date  01/28/18  Educator  [facility]  Instruction Review Code  5- Refused Teaching      Triggers and Exacerbations: - Group verbal and written instruction to review types of environmental triggers and ways to prevent exacerbations. Discuss weather changes, air quality and the benefits of nasal washing. Review warning signs and symptoms to help prevent infections. Discuss techniques for effective airway clearance, coughing, and vibrations.   AED/CPR: - Group verbal and written instruction with the use of models to demonstrate the basic use of the AED with the basic ABC's of resuscitation.   Anatomy and Physiology of the Lungs: - Group verbal and written instruction with the use of models to provide basic lung anatomy  and physiology related to function, structure and complications of lung disease.   Anatomy & Physiology of the Heart: - Group verbal and written instruction and models provide basic cardiac anatomy and physiology, with the coronary electrical and arterial systems. Review of Valvular disease and Heart Failure   Pulmonary Rehab from 01/28/2018 in [facility] Cardiac and Pulmonary Rehab  Date  01/07/18  Educator  [facility]  Instruction Review Code  1- Verbalizes Understanding      Cardiac Medications: - Group verbal and written instruction to review commonly prescribed medications for heart disease. Reviews the medication, class of the drug, and side effects.   Know Your Numbers and Risk Factors: -Group verbal and written instruction about important numbers in your health.  Discussion of what are risk factors and how they play a role in the disease process.  Review of Cholesterol, Blood Pressure, Diabetes, and BMI and the role they play in your overall health.   Sleep Hygiene: -Provides group verbal and written instruction about how sleep can affect your health.  Define sleep hygiene, discuss sleep cycles and impact of sleep habits. Review good sleep hygiene tips.    Other: -Provides group and verbal instruction on various topics (see comments)    Knowledge Questionnaire Score: Knowledge Questionnaire Score - 12/22/17 1435      Knowledge Questionnaire Score   Pre Score  17/18   reviewed with patient       Core Components/Risk Factors/Patient Goals at Admission: Personal Goals and Risk Factors at Admission - 12/22/17 1458      Core Components/Risk Factors/Patient Goals on Admission    Weight Management  Yes;Weight Maintenance    Intervention  Weight Management: Develop a combined nutrition and exercise program designed to reach desired caloric intake, while maintaining appropriate intake of nutrient and fiber, sodium and fats, and appropriate energy expenditure required for the weight  goal.;Weight Management: Provide education and appropriate resources to help participant work on and attain dietary goals.;Weight Management/Obesity: Establish reasonable short term and long term weight goals.    Admit Weight  127 lb 11.2 oz (57.9 kg)    Goal Weight: Short Term  127 lb (57.6 kg)    Goal Weight: Long Term  127 lb (57.6 kg)    Expected Outcomes  Short Term: Continue to assess and modify interventions until short term weight is achieved;Long Term: Adherence to nutrition and physical activity/exercise program aimed toward attainment of  established weight goal;Weight Maintenance: Understanding of the daily nutrition guidelines, which includes 25-35% calories from fat, 7% or less cal from saturated fats, less than 23m cholesterol, less than 1.5gm of sodium, & 5 or more servings of fruits and vegetables daily;Understanding recommendations for meals to include 15-35% energy as protein, 25-35% energy from fat, 35-60% energy from carbohydrates, less than 2085mof dietary cholesterol, 20-35 gm of total fiber daily;Understanding of distribution of calorie intake throughout the day with the consumption of 4-5 meals/snacks    Improve shortness of breath with ADL's  Yes    Intervention  Provide education, individualized exercise plan and daily activity instruction to help decrease symptoms of SOB with activities of daily living.    Expected Outcomes  Short Term: Improve cardiorespiratory fitness to achieve a reduction of symptoms when performing ADLs;Long Term: Be able to perform more ADLs without symptoms or delay the onset of symptoms    Hypertension  Yes    Intervention  Provide education on lifestyle modifcations including regular physical activity/exercise, weight management, moderate sodium restriction and increased consumption of fresh fruit, vegetables, and low fat dairy, alcohol moderation, and smoking cessation.;Monitor prescription use compliance.    Expected Outcomes  Short Term: Continued  assessment and intervention until BP is < 140/9056mG in hypertensive participants. < 130/4m57m in hypertensive participants with diabetes, heart failure or chronic kidney disease.;Long Term: Maintenance of blood pressure at goal levels.       Core Components/Risk Factors/Patient Goals Review:  Goals and Risk Factor Review    Row Name 01/26/18 1617             Core Components/Risk Factors/Patient Goals Review   Personal Goals Review  Weight Management/Obesity;Hypertension;Improve shortness of breath with ADL's       Review  Patients heart rate has been high and is wearing holter monitor today. She feels like she gets short of breath sometimes. Her ADL's have got a little better at home. Her doctor said she should take it easy while exercising to keep her heart rate down.       Expected Outcomes  Short: learn what is going on with her heart. Long: monitor heart rate independently.          Core Components/Risk Factors/Patient Goals at Discharge (Final Review):  Goals and Risk Factor Review - 01/26/18 1617      Core Components/Risk Factors/Patient Goals Review   Personal Goals Review  Weight Management/Obesity;Hypertension;Improve shortness of breath with ADL's    Review  Patients heart rate has been high and is wearing holter monitor today. She feels like she gets short of breath sometimes. Her ADL's have got a little better at home. Her doctor said she should take it easy while exercising to keep her heart rate down.    Expected Outcomes  Short: learn what is going on with her heart. Long: monitor heart rate independently.       ITP Comments: ITP Comments    Row Name 12/22/17 1416 01/05/18 0854 01/12/18 1125 02/02/18 0830 02/02/18 0905   ITP Comments  Medical Evaluation completed. Chart sent for review and changes to Dr. MarkEmily Filbertector of Lung[facility]errardagnosis can be found in [facility] encounter 12/01/2017  30 day review completed. ITP sent to Dr. MarkEmily Filbertector of [facility]. [place]inue with ITP unless changes are made by physician  NancMadelin Rear not complete her rehab session.  NancJudithee to class today. Her heart rate was higher than usual, ranging from 115-120 bpm.  She stated that she had not felt good for 2-3 days. She called her doctor's office and talked to the nurse. They advised her to go to the ED for evaluation. She was taken by staff to the ED.  30 day review completed. ITP sent to Dr. Emily Filbert Director of [place]. Continue with ITP unless changes are made by physician  Patient called to say that she is at [facility]. Her heart rate over the weekend was in the 180's. She went to the doctor and her legs collapsed while being treated. She will let us know this week if she is able to return to [place].   [place] Name 02/05/18 [id number] 02/05/18 0925         ITP Comments  Geralda reports her Dr wants her to discontinue LW and transfer to [facility] to get stronger. She will speak to her Dr about how to proceed when she finishes therapy.  Discharge ITP sent and signed by Dr. Sabra Heck.  Discharge Summary routed to PCP and Pulmonologist.         Comments: Initial ITP

## 2018-02-11 ENCOUNTER — Encounter: Payer: Medicare Other | Attending: Emergency Medicine

## 2018-02-11 DIAGNOSIS — J4520 Mild intermittent asthma, uncomplicated: Secondary | ICD-10-CM | POA: Insufficient documentation

## 2018-02-11 DIAGNOSIS — Z7982 Long term (current) use of aspirin: Secondary | ICD-10-CM | POA: Insufficient documentation

## 2018-02-11 DIAGNOSIS — E039 Hypothyroidism, unspecified: Secondary | ICD-10-CM | POA: Insufficient documentation

## 2018-02-11 DIAGNOSIS — I110 Hypertensive heart disease with heart failure: Secondary | ICD-10-CM | POA: Insufficient documentation

## 2018-02-11 DIAGNOSIS — I509 Heart failure, unspecified: Secondary | ICD-10-CM | POA: Insufficient documentation

## 2018-02-11 DIAGNOSIS — J849 Interstitial pulmonary disease, unspecified: Secondary | ICD-10-CM | POA: Insufficient documentation

## 2018-02-11 DIAGNOSIS — Z79899 Other long term (current) drug therapy: Secondary | ICD-10-CM | POA: Insufficient documentation

## 2018-02-11 DIAGNOSIS — K219 Gastro-esophageal reflux disease without esophagitis: Secondary | ICD-10-CM | POA: Insufficient documentation

## 2018-02-11 DIAGNOSIS — Z79890 Hormone replacement therapy (postmenopausal): Secondary | ICD-10-CM | POA: Insufficient documentation

## 2018-02-20 ENCOUNTER — Ambulatory Visit: Payer: Medicare Other | Admitting: Primary Care

## 2018-02-20 ENCOUNTER — Encounter: Payer: Self-pay | Admitting: Primary Care

## 2018-02-20 VITALS — BP 140/86 | HR 78 | Temp 98.0°F | Ht 62.5 in | Wt 129.2 lb

## 2018-02-20 DIAGNOSIS — E041 Nontoxic single thyroid nodule: Secondary | ICD-10-CM

## 2018-02-20 DIAGNOSIS — E785 Hyperlipidemia, unspecified: Secondary | ICD-10-CM | POA: Diagnosis not present

## 2018-02-20 DIAGNOSIS — Z09 Encounter for follow-up examination after completed treatment for conditions other than malignant neoplasm: Secondary | ICD-10-CM

## 2018-02-20 DIAGNOSIS — R29898 Other symptoms and signs involving the musculoskeletal system: Secondary | ICD-10-CM | POA: Diagnosis not present

## 2018-02-20 NOTE — Assessment & Plan Note (Signed)
Experienced side effects of myalgias and lower extremity weakness on Crestor. Hold off on treatment for now. Reconsider alternative at next visit.

## 2018-02-20 NOTE — Assessment & Plan Note (Signed)
[place] of thyroid gland pending. TSH normal on levothyroxine 88 mcg.

## 2018-02-20 NOTE — Progress Notes (Signed)
Subjective:    Patient ID: Tina Calderon, female    DOB: [birth year], 77 y.o.   MRN: [id number]  HPI  Tina Calderon is a 77 year old female who presents today for hospital follow up.  She presented to [facility] ED on 01/31/18 with a chief complaint of a three day history bilateral lower extremity weakness with difficulty walking. Work up in the ED without obvious cause for symptoms: CT head negative, labs unremarkable. MRI of Cervical Spine with degenerative changes, neural foraminal narrowing to C3-4. MRI of Thoracic Spine without fracture, incidental finding of thyroid nodule, mildly dilated midthoracic central spinal canal, borderline findings for syrinx.  She was admitted for further evaluation.   During her hospital stay she did experience progressive weakness to extremites and underwent repeat MRI's of both C and T spine.  She was consulted by Neurology who reviewed MRI's and didn't believe T-spine spinal cord dilation was causing symptoms. There was no change to the second MRI. RPR, B12 were negative.  Crestor was thought to be secondary to symptoms of myalgias and weakness so it was discontinued. MRI showed an incidental finding of 2.5 cm left thyroid nodule. TSH was normal. Further imaging was recommended for noted nodule on thyroid. Sodium tablets were discontinued. She was discharged to SNF of 02/03/18 for rehabilitation.   She was admitted to SNF for rehabilitation for 8 days, discharged home on September 4th. She felt her time in the SNF was beneficial. Since her hospital stay she's feeling better. She continues to experience low energy levels. She denies extremity weakness now. Home health physical therapy is coming to the house several times weekly.   BP Readings from Last 3 Encounters:  02/20/18 140/86  02/03/18 (!) 157/89  01/30/18 (!) 160/90     Review of Systems  Constitutional: Positive for fatigue.  Eyes: Negative for visual disturbance.  Respiratory: Negative for shortness  of breath.   Cardiovascular: Negative for chest pain.  Musculoskeletal: Negative for myalgias.  Skin: Negative for color change.  Neurological: Negative for dizziness, weakness and numbness.       Past Medical History:  Diagnosis Date  . Anginal pain ([place])   . Cancer ([place])   . CHF (congestive heart failure) ([place]) 01/22/2012  . Chronic fatigue 07/15/2014  . GERD (gastroesophageal reflux disease) 05/12/2017  . Hypertension   . Hypothyroidism   . ILD (interstitial lung disease) ([place]) 02/04/2012  . IPF (idiopathic pulmonary fibrosis) ([place]) 2013  . Mild intermittent asthma 01/29/2016  . Pelvic relaxation   . Prolapse of bladder   . Shortness of breath   . Thyroid disease      Social History   Socioeconomic History  . Marital status: Married    Spouse name: Not on file  . Number of children: Not on file  . Years of education: Not on file  . Highest education level: Not on file  Occupational History  . Not on file  Social Needs  . Financial resource strain: Not on file  . Food insecurity:    Worry: Not on file    Inability: Not on file  . Transportation needs:    Medical: Not on file    Non-medical: Not on file  Tobacco Use  . Smoking status: Never Smoker  . Smokeless tobacco: Never Used  Substance and Sexual Activity  . Alcohol use: No  . Drug use: No  . Sexual activity: Never    Birth control/protection: Post-menopausal, Surgical    Comment: HYST  Lifestyle  .  Physical activity:    Days per week: Not on file    Minutes per session: Not on file  . Stress: Not on file  Relationships  . Social connections:    Talks on phone: Not on file    Gets together: Not on file    Attends religious service: Not on file    Active member of club or organization: Not on file    Attends meetings of clubs or organizations: Not on file    Relationship status: Not on file  . Intimate partner violence:    Fear of current or ex partner: Not on file    Emotionally abused: Not on file      Physically abused: Not on file    Forced sexual activity: Not on file  Other Topics Concern  . Not on file  Social History Narrative   Married.   2 children, 5 grandchildren.   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   Enjoys puzzles, sewing, traveling, reading.     Past Surgical History:  Procedure Laterality Date  . ANTERIOR AND POSTERIOR REPAIR  12/05/2011   Procedure: ANTERIOR (CYSTOCELE) AND POSTERIOR REPAIR (RECTOCELE);  Surgeon: [name], MD;  Location: [facility];  Service: Gynecology;  Laterality: N/A;  with cysto  . Bladder tact  15 years ago  . LEFT HEART CATHETERIZATION WITH CORONARY ANGIOGRAM N/A 01/22/2012   Procedure: LEFT HEART CATHETERIZATION WITH CORONARY ANGIOGRAM;  Surgeon: [name], MD;  Location: [facility] CATH LAB;  Service: Cardiovascular;  Laterality: N/A;  . NECK SURGERY  2010  . SHOULDER SURGERY    . thyroid disease    . TONSILLECTOMY    . VAGINAL HYSTERECTOMY  15 years ago    Family History  Problem Relation Age of Onset  . Thyroid disease Mother   . Hypertension Mother   . Coronary artery disease Mother   . Dementia Mother   . Aneurysm Father   . Pulmonary fibrosis Sister   . Stroke Other   . Sleep apnea Other   . Cancer Brother        LIVER  . Pulmonary fibrosis Brother     Allergies  Allergen Reactions  . Dilaudid [Hydromorphone] Nausea And Vomiting  . Crestor [Rosuvastatin Calcium] Other (See Comments)    Myalgias, muscle weakness    Current Outpatient Medications on File Prior to Visit  Medication Sig Dispense Refill  . aspirin 81 MG tablet Take 81 mg by mouth daily.    . calcium-vitamin D (OSCAL WITH D) 500-200 MG-UNIT per tablet Take 1 tablet by mouth every morning.    . Cranberry 1000 MG CAPS Take 1,000 mg by mouth daily.     . ESBRIET 267 MG CAPS TAKE 3 CAPSULES BY MOUTH THREE TIMES DAILY WITH FOOD (Patient taking differently: Take 801 mg by mouth 3 (three) times daily. ) 270 capsule 3  . levothyroxine (SYNTHROID, LEVOTHROID)  88 MCG tablet TAKE 1 TABLET BY MOUTH EVERY DAY (Patient taking differently: Take 88 mcg by mouth daily before breakfast. ) 90 tablet 1  . loratadine (CLARITIN) 10 MG tablet Take 10 mg by mouth every evening. Reported on 10/13/2015    . metoprolol succinate (TOPROL-XL) 25 MG 24 hr tablet Take 1 tablet (25 mg total) by mouth daily. (Patient taking differently: Take 12.5 mg by mouth daily. )    . Multiple Vitamin (MULTIVITAMIN WITH MINERALS) TABS Take 1 tablet by mouth every morning. Centrum silver    . omeprazole (PRILOSEC) 20 MG capsule TAKE 1  CAPSULE (20 MG TOTAL) BY MOUTH DAILY. (Patient taking differently: Take 20 mg by mouth daily. ) 90 capsule 1   No current facility-administered medications on file prior to visit.     BP 140/86   Pulse 78   Temp 98 F (36.7 C) (Oral)   Ht 5' 2.5" (1.588 m)   Wt 129 lb 4 oz (58.6 kg)   SpO2 98%   BMI 23.26 kg/m    Objective:   Physical Exam  Constitutional: She is oriented to person, place, and time. She appears well-nourished.  Neck: Neck supple.  Cardiovascular: Normal rate and regular rhythm.  Respiratory: Effort normal and breath sounds normal.  Musculoskeletal:  Ambulatory in office without difficulty. 5/5 strength to bilateral lower extremities.   Neurological: She is alert and oriented to person, place, and time.  Skin: Skin is warm and dry.           Assessment & Plan:

## 2018-02-20 NOTE — Assessment & Plan Note (Signed)
Admitted for moderate to severe lower extremity weakness.  Full work up in ED and hospital, negative for neurological cause. Agree that Crestor could have caused symptoms has her symptoms have improved and work up was overall unremarkable.  Continue with home PT. Remain off of Crestor. Continue to monitor.   All hospital notes, imaging, labs reviewed.

## 2018-02-20 NOTE — Patient Instructions (Signed)
You will be contacted regarding the ultrasound for your thyroid nodule.  Please let us know if you have not been contacted within one week.   Continue with home health physical therapy for endurance.   Monitor your blood pressure at home and report consistent readings at or above 150/90.  It was a pleasure to see you today!

## 2018-03-05 ENCOUNTER — Ambulatory Visit: Payer: Medicare Other | Admitting: Cardiology

## 2018-03-05 ENCOUNTER — Encounter: Payer: Self-pay | Admitting: Cardiology

## 2018-03-05 ENCOUNTER — Encounter: Payer: Self-pay | Admitting: Cardiovascular Disease

## 2018-03-05 ENCOUNTER — Ambulatory Visit
Admission: RE | Admit: 2018-03-05 | Discharge: 2018-03-05 | Disposition: A | Discharge status: Home / Self Care | Payer: Medicare Other | Source: Ambulatory Visit | Attending: Primary Care | Admitting: Primary Care

## 2018-03-05 VITALS — BP 140/70 | HR 96 | Ht 62.5 in | Wt 129.2 lb

## 2018-03-05 DIAGNOSIS — E041 Nontoxic single thyroid nodule: Secondary | ICD-10-CM

## 2018-03-05 DIAGNOSIS — R55 Syncope and collapse: Secondary | ICD-10-CM | POA: Diagnosis not present

## 2018-03-05 DIAGNOSIS — R000 Tachycardia, unspecified: Secondary | ICD-10-CM | POA: Diagnosis not present

## 2018-03-05 DIAGNOSIS — R0602 Shortness of breath: Secondary | ICD-10-CM

## 2018-03-05 DIAGNOSIS — R9431 Abnormal electrocardiogram [ECG] [EKG]: Secondary | ICD-10-CM | POA: Diagnosis not present

## 2018-03-05 DIAGNOSIS — T733XXD Exhaustion due to excessive exertion, subsequent encounter: Secondary | ICD-10-CM

## 2018-03-05 NOTE — Patient Instructions (Addendum)
Medication Instructions:  Your physician recommends that you continue on your current medications as directed. Please refer to the Current Medication list given to you today.  Labwork: None  Testing/Procedures  Your physician has requested that you have en exercise stress myoview. For further information please visit [email or web address]. Please follow instruction sheet, as given. DO NOT TAKE METOPROLOL MORNING OF TEST OR NIGHT BEFORE IF YOU TAKE IN THE EVENING  THE ABOVE WITH BE DONE AT THE [facility] [address]  Follow-Up: 6-8 WEEKS    Cardiac Nuclear Scan A cardiac nuclear scan is a test that measures blood flow to the heart when a person is resting and when he or she is exercising. The test looks for problems such as:  Not enough blood reaching a portion of the heart.  The heart muscle not working normally.  You may need this test if:  You have heart disease.  You have had abnormal lab results.  You have had heart surgery or angioplasty.  You have chest pain.  You have shortness of breath.  In this test, a radioactive dye (tracer) is injected into your bloodstream. After the tracer has traveled to your heart, an imaging device is used to measure how much of the tracer is absorbed by or distributed to various areas of your heart. This procedure is usually done at a hospital and takes 2-4 hours. Tell a health care provider about:  Any allergies you have.  All medicines you are taking, including vitamins, herbs, eye drops, creams, and over-the-counter medicines.  Any problems you or family members have had with the use of anesthetic medicines.  Any blood disorders you have.  Any surgeries you have had.  Any medical conditions you have.  Whether you are pregnant or may be pregnant. What are the risks? Generally, this is a safe procedure. However, problems may occur, including:  Serious chest pain and heart attack. This is only a risk if the  stress portion of the test is done.  Rapid heartbeat.  Sensation of warmth in your chest. This usually passes quickly.  What happens before the procedure?  Ask your health care provider about changing or stopping your regular medicines. This is especially important if you are taking diabetes medicines or blood thinners.  Remove your jewelry on the day of the procedure. What happens during the procedure?  An IV tube will be inserted into one of your veins.  Your health care provider will inject a small amount of radioactive tracer through the tube.  You will wait for 20-40 minutes while the tracer travels through your bloodstream.  Your heart activity will be monitored with an electrocardiogram (ECG).  You will lie down on an exam table.  Images of your heart will be taken for about 15-20 minutes.  You may be asked to exercise on a treadmill or stationary bike. While you exercise, your heart's activity will be monitored with an ECG, and your blood pressure will be checked. If you are unable to exercise, you may be given a medicine to increase blood flow to parts of your heart.  When blood flow to your heart has peaked, a tracer will again be injected through the IV tube.  After 20-40 minutes, you will get back on the exam table and have more images taken of your heart.  When the procedure is over, your IV tube will be removed. The procedure may vary among health care providers and hospitals. Depending on the type of  tracer used, scans may need to be repeated 3-4 hours later. What happens after the procedure?  Unless your health care provider tells you otherwise, you may return to your normal schedule, including diet, activities, and medicines.  Unless your health care provider tells you otherwise, you may increase your fluid intake. This will help flush the contrast dye from your body. Drink enough fluid to keep your urine clear or pale yellow.  It is up to you to get your test  results. Ask your health care provider, or the department that is doing the test, when your results will be ready. Summary  A cardiac nuclear scan measures the blood flow to the heart when a person is resting and when he or she is exercising.  You may need this test if you are at risk for heart disease.  Tell your health care provider if you are pregnant.  Unless your health care provider tells you otherwise, increase your fluid intake. This will help flush the contrast dye from your body. Drink enough fluid to keep your urine clear or pale yellow. This information is not intended to replace advice given to you by your health care provider. Make sure you discuss any questions you have with your health care provider. Document Released: 06/21/2004 Document Revised: 05/29/2016 Document Reviewed: 05/05/2013 Elsevier Interactive Patient Education  2017 [employer].

## 2018-03-05 NOTE — Progress Notes (Signed)
Cardiology Office Note:    Date:  03/05/2018   ID:  Tina Calderon, DOB 1940-08-01, MRN [id number]  PCP:  [name], NP  Cardiologist:  Buford Dresser, MD PhD  Referring MD: [name], NP   CC: Follow up  History of Present Illness:    Tina Calderon is a 77 y.o. female with a hx of pulmonary fibrosis who is seen in follow up for evaluation and management of tachycardia, fatigue, exertional intolerance.  Summary from last visit: She presented to [facility] ER on 01/12/18 with palpitations and shortness of breath. At pulmonary rehab that morning, her heart rate was noted to be 120 bpm (patient states this was at rest). In the ER, her ECG showed sinus tachycardia at 104 bpm, CT was negative for PE, negative troponin. She feels exhausted with basic everyday activities. For instance, she cooks breakfast every morning for herself and her husband. By the time she is done, she has no appetite. Often she has to lie down to rest for a time after. She is supposed to do about 50 minutes of near continuous cardiovascular activity at pulmonary rehab, and she is completely exhausted by the end of this. She endorses good food and fluid intake despite her fatigue. No infectious symptoms or recent illnesses.  Since last visit, was admitted to the hospital with bilateral weakness and falls. She was discharged on 02/02/18. She had her crestor stopped, though she doesn't relate any improvements since stopping it. Didn't have muscle pain, just weakness. Has had physical therapy for a time after, but has stopped physical therapy. Her biggest concern is her fatigue, which has continued unchanged with all of her recent events. She continues to feel completely exhausted with even minimal exertion. She has done 6MW test and pulmonary rehab, and she reports that her O2 sats have all been fine. No fevers/chills, no LE edema, no other recent changes. She is very concerned at how limited she is. She has never  fully lost consciousness, but she feels like she might if she keeps pushing herself.  Past Medical History:  Diagnosis Date  . Anginal pain ([place])   . Cancer ([place])   . CHF (congestive heart failure) ([place]) 01/22/2012  . Chronic fatigue 07/15/2014  . GERD (gastroesophageal reflux disease) 05/12/2017  . Hypertension   . Hypothyroidism   . ILD (interstitial lung disease) ([place]) 02/04/2012  . IPF (idiopathic pulmonary fibrosis) ([place]) 2013  . Mild intermittent asthma 01/29/2016  . Pelvic relaxation   . Prolapse of bladder   . Shortness of breath   . Thyroid disease     Past Surgical History:  Procedure Laterality Date  . ANTERIOR AND POSTERIOR REPAIR  12/05/2011   Procedure: ANTERIOR (CYSTOCELE) AND POSTERIOR REPAIR (RECTOCELE);  Surgeon: [name], MD;  Location: [facility];  Service: Gynecology;  Laterality: N/A;  with cysto  . Bladder tact  15 years ago  . LEFT HEART CATHETERIZATION WITH CORONARY ANGIOGRAM N/A 01/22/2012   Procedure: LEFT HEART CATHETERIZATION WITH CORONARY ANGIOGRAM;  Surgeon: [name], MD;  Location: [facility] CATH LAB;  Service: Cardiovascular;  Laterality: N/A;  . NECK SURGERY  2010  . SHOULDER SURGERY    . thyroid disease    . TONSILLECTOMY    . VAGINAL HYSTERECTOMY  15 years ago    Current Medications: Current Outpatient Medications on File Prior to Visit  Medication Sig  . aspirin 81 MG tablet Take 81 mg by mouth daily.  . calcium-vitamin D (OSCAL WITH D)  500-200 MG-UNIT per tablet Take 1 tablet by mouth every morning.  . Cranberry 1000 MG CAPS Take 1,000 mg by mouth daily.   . ESBRIET 267 MG CAPS TAKE 3 CAPSULES BY MOUTH THREE TIMES DAILY WITH FOOD (Patient taking differently: Take 801 mg by mouth 3 (three) times daily. )  . levothyroxine (SYNTHROID, LEVOTHROID) 88 MCG tablet TAKE 1 TABLET BY MOUTH EVERY DAY (Patient taking differently: Take 88 mcg by mouth daily before breakfast. )  . loratadine (CLARITIN) 10 MG tablet Take 10 mg by mouth every evening.  Reported on 10/13/2015  . metoprolol succinate (TOPROL-XL) 25 MG 24 hr tablet Take 1 tablet (25 mg total) by mouth daily. (Patient taking differently: Take 12.5 mg by mouth daily. )  . Multiple Vitamin (MULTIVITAMIN WITH MINERALS) TABS Take 1 tablet by mouth every morning. Centrum silver  . omeprazole (PRILOSEC) 20 MG capsule TAKE 1 CAPSULE (20 MG TOTAL) BY MOUTH DAILY. (Patient taking differently: Take 20 mg by mouth daily. )   No current facility-administered medications on file prior to visit.      Allergies:   Dilaudid [hydromorphone] and Crestor [rosuvastatin calcium]   Social History   Socioeconomic History  . Marital status: Married    Spouse name: Not on file  . Number of children: Not on file  . Years of education: Not on file  . Highest education level: Not on file  Occupational History  . Not on file  Social Needs  . Financial resource strain: Not on file  . Food insecurity:    Worry: Not on file    Inability: Not on file  . Transportation needs:    Medical: Not on file    Non-medical: Not on file  Tobacco Use  . Smoking status: Never Smoker  . Smokeless tobacco: Never Used  Substance and Sexual Activity  . Alcohol use: No  . Drug use: No  . Sexual activity: Never    Birth control/protection: Post-menopausal, Surgical    Comment: HYST  Lifestyle  . Physical activity:    Days per week: Not on file    Minutes per session: Not on file  . Stress: Not on file  Relationships  . Social connections:    Talks on phone: Not on file    Gets together: Not on file    Attends religious service: Not on file    Active member of club or organization: Not on file    Attends meetings of clubs or organizations: Not on file    Relationship status: Not on file  Other Topics Concern  . Not on file  Social History Narrative   Married.   2 children, 5 grandchildren.   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   Enjoys puzzles, sewing, traveling, reading.      Family History: The  patient's family history includes Aneurysm in her father; Cancer in her brother; Coronary artery disease in her mother; Dementia in her mother; Hypertension in her mother; Pulmonary fibrosis in her brother and sister; Sleep apnea in her other; Stroke in her other; Thyroid disease in her mother.  ROS:   Please see the history of present illness.  Additional pertinent ROS: Review of Systems  Constitutional: Positive for malaise/fatigue. Negative for chills and fever.  HENT: Negative for ear pain and hearing loss.   Eyes: Negative for blurred vision and pain.  Respiratory: Positive for shortness of breath. Negative for hemoptysis, sputum production and wheezing.   Cardiovascular: Negative for chest pain, palpitations, orthopnea, claudication, leg  swelling and PND.  Gastrointestinal: Negative for abdominal pain and blood in stool.  Genitourinary: Negative for dysuria and hematuria.  Musculoskeletal: Negative for falls and myalgias.  Skin: Negative for rash.  Neurological: Negative for focal weakness and loss of consciousness.  Endo/Heme/Allergies: Does not bruise/bleed easily.   EKGs/Labs/Other Studies Reviewed:    The following studies were reviewed today: ER visit, prior ECGs and workup.   Echo 01/31/18 Study Conclusions  - Left ventricle: The cavity size was normal. Wall thickness was   normal. Systolic function was normal. The estimated ejection   fraction was in the range of 60% to 65%. Wall motion was normal;   there were no regional wall motion abnormalities. Doppler   parameters are consistent with abnormal left ventricular   relaxation (grade 1 diastolic dysfunction). - Aortic valve: Mildly calcified annulus. Normal thickness   leaflets. Valve area (VTI): 1.83 cm^2. Valve area (Vmax): 2.06   cm^2. Valve area (Vmean): 1.74 cm^2. - Mitral valve: Mildly calcified annulus. Normal thickness leaflets   . - Left atrium: The atrium was severely dilated. - Right ventricle: The cavity  size was mildly dilated. - Technically adequate study.  Elevated RA pressures, RV-RA gradient 47 mmHg.   Monitor 01/29/18 Findings Ventricular tachycardia-nonsustained-up to 5 beats Atrial tachycardia-37 episodes      Symptoms of fluttering sinus rhythm;                              rare PAC                             Isolated PVC Symptoms of shortness of breath PACs/PVCs  Long term monitor reviewed. All of her patient triggered events are sinus rhythm, occasionally tachycardia, rare isolated ectopy. She had one episode of 5 beats of NSVT without symptoms, and she had one brief episode of 3 beats of SVT. Rest are isolated atrial ectopy and rare PVCs.  CT angio chest 01/12/18 IMPRESSION: 1. No acute findings. No pulmonary embolism seen. No evidence of pneumonia or pulmonary edema. 2. 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 3. Large hiatal hernia, stable. 4. Cardiomegaly. 5. Coronary artery calcifications.  EKG:  EKGs from 01/12/18 and 01/19/18 reviewed, both consistent with sinus tachycardia (8/5 ecg with PVC). There is a pattern suggestive of prior anterior MI.  Recent Labs: 11/17/2017: ALT 11 01/19/2018: TSH 0.70 01/31/2018: B Natriuretic Peptide 203.2; BUN 7; Creatinine, Ser 0.83; Hemoglobin 10.6; Magnesium 2.0; Platelets 220; Potassium 3.8; Sodium 137  Recent Lipid Panel    Component Value Date/Time   CHOL 239 (H) 12/15/2017 0902   TRIG 175.0 (H) 12/15/2017 0902   HDL 55.10 12/15/2017 0902   CHOLHDL 4 12/15/2017 0902   VLDL 35.0 12/15/2017 0902   LDLCALC 149 (H) 12/15/2017 0902    Physical Exam:    VS:  BP 140/70   Pulse 96   Ht 5' 2.5" (1.588 m)   Wt 129 lb 3.2 oz (58.6 kg)   SpO2 96%   BMI 23.25 kg/m     Wt Readings from Last 3 Encounters:  03/05/18 129 lb 3.2 oz (58.6 kg)  02/20/18 129 lb 4 oz (58.6 kg)  01/31/18 129 lb 3 oz (58.6 kg)       GEN: Well nourished, well developed in no acute distress HEENT: Normal NECK:  No JVD; No carotid bruits LYMPHATICS: No lymphadenopathy CARDIAC: regular rhythm, normal S1 and S2, no murmurs, rubs, gallops. Radial and DP pulses 2+ bilaterally. RESPIRATORY:   No consolidation. No tight wheezing but upper airways with inspiratory rhonchi. Faint crackles throughout upper fields. ABDOMEN: Soft, non-tender, non-distended MUSCULOSKELETAL:  No edema; No deformity  SKIN: Warm and dry NEUROLOGIC:  Alert and oriented x 3 PSYCHIATRIC:  Normal affect   ASSESSMENT:    1. Shortness of breath   2. Near syncope   3. Abnormal EKG   4. Sinus tachycardia seen on cardiac monitor   5. Fatigue due to excessive exertion, subsequent encounter    PLAN:    1. Tachycardia: has been sinus when noted. Monitor showed no sustained arrhyhmias, rare PACs/PVCs. Her triggered events were predominantly sinus rhythm. HR is regular and in the 90s currently. Concern would be if she were having intermittent arrhythmia.  -she is already on low dose metoprolol. With her heart rate range and frequency of events, no indication to increase the dose.  -her repeat echo while she was in the hospital does not show structural heart changes, but her right sided pressures are elevated.   2. Shortness of breath, severe fatigue with exertion, near syncope: has been worsened in recent months. Can do only very minimal activity before becoming completely wiped out. We have investigated her systolic function, which  Is normal; she has grade 1 diastolic dysfunction. She does not have significant valvular disease, but she does have a large left atrium, aneurysmal atrial septum, and elevated right sided pressures. This may be pulmonary hypertension, likely secondary to her lung disease. However, this may also be an anginal equivalent.  -will plan for treadmill MPI to determine her exercise capacity, symptoms, and possible ischemia. With her  baseline ECG showing some abnormality, will pursue imaging as well to make sure that test is diagnostic.  Plan for follow up: 6-8 weeks to determine if her symptoms have changed with conditioning. Will follow up results of the event monitor in the interim.  Medication Adjustments/Labs and Tests Ordered: Current medicines are reviewed at length with the patient today.  Concerns regarding medicines are outlined above.  Orders Placed This Encounter  Procedures  . MYOCARDIAL PERFUSION IMAGING   No orders of the defined types were placed in this encounter.   Patient Instructions  Medication Instructions:  Your physician recommends that you continue on your current medications as directed. Please refer to the Current Medication list given to you today.  Labwork: None  Testing/Procedures  Your physician has requested that you have en exercise stress myoview. For further information please visit [email or web address]. Please follow instruction sheet, as given. DO NOT TAKE METOPROLOL MORNING OF TEST OR NIGHT BEFORE IF YOU TAKE IN THE EVENING  THE ABOVE WITH BE DONE AT THE [facility] [address]  Follow-Up: 6-8 WEEKS    Cardiac Nuclear Scan A cardiac nuclear scan is a test that measures blood flow to the heart when a person is resting and when he or she is exercising. The test looks for problems such as:  Not enough blood reaching a portion of the heart.  The heart muscle not working normally.  You may need this test if:  You have heart disease.  You have had abnormal lab results.  You have had heart surgery or angioplasty.  You have chest pain.  You have shortness of breath.  In this test, a radioactive dye (tracer) is injected into your bloodstream.  After the tracer has traveled to your heart, an imaging device is used to measure how much of the tracer is absorbed by or distributed to various areas of your heart. This procedure is usually done at a hospital and  takes 2-4 hours. Tell a health care provider about:  Any allergies you have.  All medicines you are taking, including vitamins, herbs, eye drops, creams, and over-the-counter medicines.  Any problems you or family members have had with the use of anesthetic medicines.  Any blood disorders you have.  Any surgeries you have had.  Any medical conditions you have.  Whether you are pregnant or may be pregnant. What are the risks? Generally, this is a safe procedure. However, problems may occur, including:  Serious chest pain and heart attack. This is only a risk if the stress portion of the test is done.  Rapid heartbeat.  Sensation of warmth in your chest. This usually passes quickly.  What happens before the procedure?  Ask your health care provider about changing or stopping your regular medicines. This is especially important if you are taking diabetes medicines or blood thinners.  Remove your jewelry on the day of the procedure. What happens during the procedure?  An IV tube will be inserted into one of your veins.  Your health care provider will inject a small amount of radioactive tracer through the tube.  You will wait for 20-40 minutes while the tracer travels through your bloodstream.  Your heart activity will be monitored with an electrocardiogram (ECG).  You will lie down on an exam table.  Images of your heart will be taken for about 15-20 minutes.  You may be asked to exercise on a treadmill or stationary bike. While you exercise, your heart's activity will be monitored with an ECG, and your blood pressure will be checked. If you are unable to exercise, you may be given a medicine to increase blood flow to parts of your heart.  When blood flow to your heart has peaked, a tracer will again be injected through the IV tube.  After 20-40 minutes, you will get back on the exam table and have more images taken of your heart.  When the procedure is over, your IV  tube will be removed. The procedure may vary among health care providers and hospitals. Depending on the type of tracer used, scans may need to be repeated 3-4 hours later. What happens after the procedure?  Unless your health care provider tells you otherwise, you may return to your normal schedule, including diet, activities, and medicines.  Unless your health care provider tells you otherwise, you may increase your fluid intake. This will help flush the contrast dye from your body. Drink enough fluid to keep your urine clear or pale yellow.  It is up to you to get your test results. Ask your health care provider, or the department that is doing the test, when your results will be ready. Summary  A cardiac nuclear scan measures the blood flow to the heart when a person is resting and when he or she is exercising.  You may need this test if you are at risk for heart disease.  Tell your health care provider if you are pregnant.  Unless your health care provider tells you otherwise, increase your fluid intake. This will help flush the contrast dye from your body. Drink enough fluid to keep your urine clear or pale yellow. This information is not intended to replace advice given to you  by your health care provider. Make sure you discuss any questions you have with your health care provider. Document Released: 06/21/2004 Document Revised: 05/29/2016 Document Reviewed: 05/05/2013 Elsevier Interactive Patient Education  2017 Elsevier Inc.      Signed, Buford Dresser, MD PhD 03/05/2018 11:01 AM    [place]

## 2018-03-06 ENCOUNTER — Telehealth: Payer: Self-pay | Admitting: Primary Care

## 2018-03-06 DIAGNOSIS — E041 Nontoxic single thyroid nodule: Secondary | ICD-10-CM

## 2018-03-06 NOTE — Telephone Encounter (Signed)
Copied from [place] [id number]. Topic: Quick Communication - See Telephone Encounter >> Mar 06, 2018  3:52 PM Blase Mess A wrote: CRM for notification. See Telephone encounter for: 03/06/18. Patient is calling to speak to [place] regarding the biopsy please call back patient call back [phone]

## 2018-03-06 NOTE — Telephone Encounter (Signed)
I spoke with pt and it was Tina Calderon pt wanted to know that the appt was set up for 03/18/18. Pt does not need cb but wanted Tina Calderon to be aware.

## 2018-03-15 ENCOUNTER — Encounter (HOSPITAL_COMMUNITY): Payer: Self-pay

## 2018-03-15 ENCOUNTER — Emergency Department (HOSPITAL_COMMUNITY)
Admission: EM | Admit: 2018-03-15 | Discharge: 2018-03-17 | Disposition: A | Discharge status: Home / Self Care | Payer: Medicare Other | Attending: Emergency Medicine | Admitting: Emergency Medicine

## 2018-03-15 ENCOUNTER — Other Ambulatory Visit: Payer: Self-pay

## 2018-03-15 ENCOUNTER — Emergency Department (HOSPITAL_COMMUNITY): Payer: Medicare Other

## 2018-03-15 DIAGNOSIS — E039 Hypothyroidism, unspecified: Secondary | ICD-10-CM | POA: Diagnosis not present

## 2018-03-15 DIAGNOSIS — I110 Hypertensive heart disease with heart failure: Secondary | ICD-10-CM | POA: Insufficient documentation

## 2018-03-15 DIAGNOSIS — Z7982 Long term (current) use of aspirin: Secondary | ICD-10-CM | POA: Insufficient documentation

## 2018-03-15 DIAGNOSIS — R29898 Other symptoms and signs involving the musculoskeletal system: Secondary | ICD-10-CM

## 2018-03-15 DIAGNOSIS — Z79899 Other long term (current) drug therapy: Secondary | ICD-10-CM | POA: Insufficient documentation

## 2018-03-15 DIAGNOSIS — I5032 Chronic diastolic (congestive) heart failure: Secondary | ICD-10-CM | POA: Insufficient documentation

## 2018-03-15 DIAGNOSIS — M6281 Muscle weakness (generalized): Secondary | ICD-10-CM | POA: Insufficient documentation

## 2018-03-15 LAB — CBC
HCT: 36.3 % (ref 36.0–46.0)
Hemoglobin: 10.9 g/dL — ABNORMAL LOW (ref 12.0–15.0)
MCH: 24.9 pg — ABNORMAL LOW (ref 26.0–34.0)
MCHC: 30 g/dL (ref 30.0–36.0)
MCV: 83.1 fL (ref 78.0–100.0)
Platelets: 225 10*3/uL (ref 150–400)
RBC: 4.37 MIL/uL (ref 3.87–5.11)
RDW: 14.9 % (ref 11.5–15.5)
WBC: 7.4 10*3/uL (ref 4.0–10.5)

## 2018-03-15 LAB — URINALYSIS, ROUTINE W REFLEX MICROSCOPIC
Bilirubin Urine: NEGATIVE
Glucose, UA: NEGATIVE mg/dL
Hgb urine dipstick: NEGATIVE
Ketones, ur: NEGATIVE mg/dL
Leukocytes, UA: NEGATIVE
Nitrite: NEGATIVE
Protein, ur: NEGATIVE mg/dL
SPECIFIC GRAVITY, URINE: 1.006 (ref 1.005–1.030)
pH: 6 (ref 5.0–8.0)

## 2018-03-15 LAB — BASIC METABOLIC PANEL
Anion gap: 10 (ref 5–15)
BUN: 14 mg/dL (ref 8–23)
CALCIUM: 9.2 mg/dL (ref 8.9–10.3)
CO2: 26 mmol/L (ref 22–32)
Chloride: 103 mmol/L (ref 98–111)
Creatinine, Ser: 0.87 mg/dL (ref 0.44–1.00)
GFR calc non Af Amer: >60 mL/min (ref >60)
Glucose, Bld: 105 mg/dL — ABNORMAL HIGH (ref 70–99)
Potassium: 4.1 mmol/L (ref 3.5–5.1)
Sodium: 139 mmol/L (ref 135–145)

## 2018-03-15 LAB — CK: Total CK: 60 U/L (ref 38–234)

## 2018-03-15 LAB — CBG MONITORING, ED: GLUCOSE-CAPILLARY: 104 mg/dL — AB (ref 70–99)

## 2018-03-15 LAB — TSH: TSH: 1.593 u[IU]/mL (ref 0.350–4.500)

## 2018-03-15 LAB — SEDIMENTATION RATE: Sed Rate: 10 mm/hr (ref 0–22)

## 2018-03-15 LAB — T4, FREE: Free T4: 0.87 ng/dL (ref 0.82–1.77)

## 2018-03-15 MED ORDER — ASPIRIN EC 81 MG PO TBEC
81.0000 mg | DELAYED_RELEASE_TABLET | Freq: Every day | ORAL | Status: DC
  Administered 2018-03-16 – 2018-03-17 (×2): 81 mg via ORAL
  Filled 2018-03-15 (×2): qty 1

## 2018-03-15 MED ORDER — METHYLPREDNISOLONE SODIUM SUCC 125 MG IJ SOLR
125.0000 mg | Freq: Once | INTRAMUSCULAR | Status: AC
  Administered 2018-03-15: 125 mg via INTRAVENOUS
  Filled 2018-03-15: qty 2

## 2018-03-15 MED ORDER — LEVOTHYROXINE SODIUM 88 MCG PO TABS
88.0000 ug | ORAL_TABLET | Freq: Every day | ORAL | Status: DC
  Administered 2018-03-16 – 2018-03-17 (×2): 88 ug via ORAL
  Filled 2018-03-15 (×2): qty 1

## 2018-03-15 MED ORDER — BACLOFEN 5 MG HALF TABLET
5.0000 mg | ORAL_TABLET | Freq: Three times a day (TID) | ORAL | Status: DC
  Administered 2018-03-16 – 2018-03-17 (×4): 5 mg via ORAL
  Filled 2018-03-15 (×4): qty 1

## 2018-03-15 MED ORDER — PANTOPRAZOLE SODIUM 40 MG PO TBEC
40.0000 mg | DELAYED_RELEASE_TABLET | Freq: Every day | ORAL | Status: DC
  Administered 2018-03-16 – 2018-03-17 (×2): 40 mg via ORAL
  Filled 2018-03-15 (×2): qty 1

## 2018-03-15 MED ORDER — ACETAMINOPHEN 325 MG PO TABS: 325.0000 mg | ORAL_TABLET | Freq: Four times a day (QID) | ORAL | Status: DC | PRN

## 2018-03-15 MED ORDER — LORATADINE 10 MG PO TABS
10.0000 mg | ORAL_TABLET | Freq: Every day | ORAL | Status: DC
  Administered 2018-03-16 – 2018-03-17 (×2): 10 mg via ORAL
  Filled 2018-03-15 (×2): qty 1

## 2018-03-15 MED ORDER — METOPROLOL SUCCINATE ER 25 MG PO TB24
12.5000 mg | ORAL_TABLET | Freq: Every day | ORAL | Status: DC
  Administered 2018-03-16 – 2018-03-17 (×2): 12.5 mg via ORAL
  Filled 2018-03-15 (×2): qty 1

## 2018-03-15 MED ORDER — ADULT MULTIVITAMIN W/MINERALS CH
1.0000 | ORAL_TABLET | Freq: Every day | ORAL | Status: DC
  Administered 2018-03-16 – 2018-03-17 (×2): 1 via ORAL
  Filled 2018-03-15 (×2): qty 1

## 2018-03-15 MED ORDER — GADOBUTROL 1 MMOL/ML IV SOLN
5.0000 mL | Freq: Once | INTRAVENOUS | Status: AC | PRN
  Administered 2018-03-15: 5 mL via INTRAVENOUS

## 2018-03-15 MED ORDER — PIRFENIDONE 267 MG PO CAPS
801.0000 mg | ORAL_CAPSULE | Freq: Three times a day (TID) | ORAL | Status: DC
  Administered 2018-03-16 (×2): 801 mg via ORAL
  Filled 2018-03-15: qty 2

## 2018-03-15 MED ORDER — CALCIUM CARBONATE-VITAMIN D 500-200 MG-UNIT PO TABS
1.0000 | ORAL_TABLET | Freq: Every day | ORAL | Status: DC
  Administered 2018-03-16 – 2018-03-17 (×2): 1 via ORAL
  Filled 2018-03-15 (×3): qty 1

## 2018-03-15 MED ORDER — BACLOFEN 5 MG HALF TABLET
5.0000 mg | ORAL_TABLET | Freq: Once | ORAL | Status: AC
  Administered 2018-03-15: 5 mg via ORAL
  Filled 2018-03-15: qty 1

## 2018-03-15 NOTE — Consult Note (Addendum)
Requesting Physician: Dr. Vanita Panda    Chief Complaint:  Bilateral leg weakness  History obtained from: Patient and Chart     HPI:                                                                                                                                       Tina Calderon is an 77 y.o. female with past medical history of hypertension, hypothyroidism, congestive heart failure presents to the emergency room after developing sudden onset weakness in both legs while at church earlier in the day. He was walking to the alter and suddenly collapsed.  She had similar presentation in August where she collapsed at her doctor's office.  She was admitted and had extensive work-up work-up including MRI brain C-spine and  T-spine dental dilated central spinal canal from T6-T8.  There was concern for embellishment of her symptoms and she received physical therapy.  Her symptoms completely improved and patient was back to her baseline until this event.  She denies any pain or back or shooting pain down her legs.  Occasionally she complains of muscle cramps.  Denies any underlying stressors. She also has jerking of her right leg which she claims is involuntary.   She underwent an MRI lumbar spine in the emergency room which showed disc degeneration most prominent in the left L5-S1 and bilateral normal foraminal stenosis.  No cord compression was seen.  Neurology was consulted for further recommendations.     Past Medical History:  Diagnosis Date  . Anginal pain ([place])   . Cancer ([place])   . CHF (congestive heart failure) ([place]) 01/22/2012  . Chronic fatigue 07/15/2014  . GERD (gastroesophageal reflux disease) 05/12/2017  . Hypertension   . Hypothyroidism   . ILD (interstitial lung disease) ([place]) 02/04/2012  . IPF (idiopathic pulmonary fibrosis) ([place]) 2013  . Mild intermittent asthma 01/29/2016  . Pelvic relaxation   . Prolapse of bladder   . Shortness of breath   . Thyroid disease     Past Surgical  History:  Procedure Laterality Date  . ANTERIOR AND POSTERIOR REPAIR  12/05/2011   Procedure: ANTERIOR (CYSTOCELE) AND POSTERIOR REPAIR (RECTOCELE);  Surgeon: [name], MD;  Location: [facility];  Service: Gynecology;  Laterality: N/A;  with cysto  . Bladder tact  15 years ago  . LEFT HEART CATHETERIZATION WITH CORONARY ANGIOGRAM N/A 01/22/2012   Procedure: LEFT HEART CATHETERIZATION WITH CORONARY ANGIOGRAM;  Surgeon: [name], MD;  Location: [facility] CATH LAB;  Service: Cardiovascular;  Laterality: N/A;  . NECK SURGERY  2010  . SHOULDER SURGERY    . thyroid disease    . TONSILLECTOMY    . VAGINAL HYSTERECTOMY  15 years ago    Family History  Problem Relation Age of Onset  . Thyroid disease Mother   . Hypertension Mother   . Coronary artery disease Mother   . Dementia Mother   .  Aneurysm Father   . Pulmonary fibrosis Sister   . Stroke Other   . Sleep apnea Other   . Cancer Brother        LIVER  . Pulmonary fibrosis Brother    Social History:  reports that she has never smoked. She has never used smokeless tobacco. She reports that she does not drink alcohol or use drugs.  Allergies:  Allergies  Allergen Reactions  . Dilaudid [Hydromorphone] Nausea And Vomiting  . Crestor [Rosuvastatin Calcium] Other (See Comments)    Myalgias, muscle weakness    Medications:                                                                                                                        I reviewed home medications   ROS:                                                                                                                                     14 systems reviewed and negative except above    Examination:                                                                                                      General: Appears well-developed and well-nourished.  Psych: Affect appropriate to situation Eyes: No scleral injection HENT: No OP obstrucion Head: Normocephalic.   Cardiovascular: Normal rate and regular rhythm.  Respiratory: Effort normal and breath sounds normal to anterior ascultation GI: Soft.  No distension. There is no tenderness.  Skin: WDI    Neurological Examination  Mental Status: Alert, oriented, thought content appropriate.  Speech fluent without evidence of aphasia.  Able to follow 3 step commands without difficulty. Cranial Nerves: II: visual fields grossly normal, pupils equal, round, reactive to light and accommodation III,IV, VI: ptosis not present, extraocular muscles extra-ocular motions intact bilaterally V,VII: smile symmetric, facial light touch sensation normal bilaterally VIII: hearing normal bilaterally IX,X: gag reflex present XI: trapezius strength/neck flexion strength normal bilaterally XII:  tongue strength normal  Motor: Right : Upper extremity    Left:     Upper extremity 5/5 deltoid       5/5 deltoid 5/5 tricep      5/5 tricep 5/5 biceps      5/5 biceps  5/5wrist flexion     5/5 wrist flexion 5/5 wrist extension     5/5 wrist extension 5/5 hand grip      5/5 hand grip  Lower extremity     Lower extremity 5/5 hip flexor      5/5 hip flexor 5/5 hip adductors     5/5 hip adductors 5/5 hip abductors     5/5 hip abductors 5/5 quadricep      5/5 quadriceps  5/5 hamstrings     5/5 hamstrings 5/5 plantar flexion       5/5 plantar flexion 5/5 plantar extension     5/5 plantar extension Tone and bulk:normal tone throughout; no atrophy noted Sensory: Pinprick and light touch intact throughout, bilaterally  Deep Tendon Reflexes:  Right: Upper Extremity   Left: Upper extremity   biceps (C-5 to C-6) 2/4   biceps (C-5 to C-6) 2/4  Lower Extremity Lower Extremity   quadriceps (L-2 to L-4) 2/4   quadriceps (L-2 to L-4) 2/4 Achilles (S1) 2/4   Achilles (S1) 2/4 Plantars: Right: downgoing   Left: downgoing Cerebellar: normal finger-to-nose, normal rapid alternating movements and normal heel-to-shin test Gait: not  tested    Lab Results: Basic Metabolic Panel: Recent Labs  Lab 03/15/18 1611  NA 139  K 4.1  CL 103  CO2 26  GLUCOSE 105*  BUN 14  CREATININE 0.87  CALCIUM 9.2    CBC: Recent Labs  Lab 03/15/18 1611  WBC 7.4  HGB 10.9*  HCT 36.3  MCV 83.1  PLT 225    Coagulation Studies: No results for input(s): LABPROT, INR in the last 72 hours.  Imaging: Mr Lumbar Spine W Wo Contrast (assess For Abscess, Cord Compression)  Result Date: 03/15/2018 CLINICAL DATA:  Bilateral lower extremity weakness today. Similar incident in August. EXAM: MRI LUMBAR SPINE WITHOUT AND WITH CONTRAST TECHNIQUE: Multiplanar and multiecho pulse sequences of the lumbar spine were obtained without and with intravenous contrast. CONTRAST:  5 mL Gadavist COMPARISON:  CT abdomen and pelvis 12/18/2013 FINDINGS: Segmentation:  Standard. Alignment: Trace anterolisthesis of L3 on L4 and trace retrolisthesis of L5 on S1. Vertebrae: Mildly heterogeneous bone marrow signal diffusely, non-specific. No suspicious focal osseous lesion, fracture, or significant marrow edema. Chronic degenerative endplate changes at W1-X9. Conus medullaris and cauda equina: Conus extends to the L1 level. Conus and cauda equina appear normal. Paraspinal and other soft tissues: Unremarkable. Disc levels: Disc desiccation throughout the lumbar spine. Severe disc space narrowing at L5-S1 with mild narrowing from L2-3 to L4-5. L1-2: Minimal disc bulging and slight facet hypertrophy without stenosis. L2-3: Mild disc bulging and moderate facet and ligamentum flavum hypertrophy result in borderline spinal stenosis without neural foraminal stenosis. Small facet joint effusions bilaterally. L3-4: Mild circumferential disc bulging and moderate to severe facet and ligamentum flavum hypertrophy result in mild spinal stenosis and borderline bilateral neural foraminal stenosis. Small facet joint effusions. L4-5: Mild circumferential disc bulging and moderate to severe  facet and ligamentum flavum hypertrophy result in mild spinal stenosis and borderline bilateral neural foraminal stenosis. Moderate bilateral facet joint effusions. L5-S1: Disc bulging, endplate spurring, and disc space height loss result in borderline to mild bilateral neural foraminal stenosis and mild left lateral recess  stenosis without spinal stenosis. The left S1 nerve root could be affected in the lateral recess. IMPRESSION: 1. Disc degeneration most advanced at L5-S1 where there is mild left lateral recess and bilateral neural foraminal stenosis. 2. Moderate to severe facet hypertrophy at L3-4 and L4-5 with mild spinal stenosis at both levels. Electronically Signed   By: Logan Bores M.D.   On: 03/15/2018 20:00     I have reviewed the above imaging:  MRI  L spine    ASSESSMENT AND PLAN   77 y.o. female with past medical history of hypertension, hypothyroidism, congestive heart failure presents to the emergency room sudden onset bilateral lower extremity weakness.  On examination patient appears to extend both her legs and appears to have difficulty raising both her legs.  However when testing strength to specific muscles she appears to have normals strength.  Also saw her jerking her right leg which patient claims is involuntary, however subsides when asked to perform distracting tasks.  With negative imaging, labs (MRI L-spine was no cord compression, CK normal) and improvement of strength on encouragement, normal reflexes -  I suspect the nature of her symptoms are likely functional.  She did complain of muscle cramps and this may be causing her to embellish/exaggerated her symptoms.  Therefore I recommend a low-dose baclofen to see if her symptoms improve. No evidence of hypokalemia. If she  remains unable to walk she will likely need be admitted again for PT/ OT evaluation.  Impression  Bilateral lower extremity weaknes  Recommendations Baclofen 15m QHS PT/OT evaluation    Neurology  will sign off. Can refer with outpatient neurologist if symptoms continue for EMG/NCS.  Evelia Waskey [facility] Pager Number [phone]

## 2018-03-15 NOTE — ED Notes (Signed)
Pt c/o a sl headache

## 2018-03-15 NOTE — ED Notes (Signed)
Pure wick placed.

## 2018-03-15 NOTE — ED Notes (Signed)
Patient transported to MRI 

## 2018-03-15 NOTE — ED Notes (Signed)
Neurologist at the bedside

## 2018-03-15 NOTE — Care Management (Addendum)
ED CM consulted regarding patient having concerns about possible hospital stay. CM met with patient and family patient reported that she was she was at [facility] in August 2019 and was under obs status for several days. ED CM explained the obs vs inpatient admission under Medicare guideline. Patient and family verbalized understanding, patient states she has not received the bill from the hospital stay yet. CM encouraged her to call the billing department once she receives the bill if she should have questions, verbalized understanding.  ED evaluation still in progress.

## 2018-03-15 NOTE — ED Triage Notes (Signed)
Pt arrived via [facility] from home c/o BLE weakness at church this morning.  Pt reports similar incident in August.  Denies any pain.

## 2018-03-15 NOTE — ED Provider Notes (Addendum)
[facility] EMERGENCY DEPARTMENT Provider Note   CSN: [id number] Arrival date & time: 03/15/18  1430     History   Chief Complaint Chief Complaint  Patient presents with  . Weakness    HPI Tina Calderon is a 77 y.o. female.  HPI  Patient presents due to concern of lower extremity weakness. Notably, the patient had a similar episode last month, was seen, admitted here for evaluation. She notes that on discharge she was doing generally better, after a short time in a rehabilitation facility. However, today, about 4 hours prior to ED arrival she felt relatively sudden onset weakness in both lower extremities, requiring assistance to lower to the ground. No upper extremity weakness, no lightheadedness, no syncope, no loss of sensation in the distal lower extremities. Since onset, symptoms have been persistent.  Past Medical History:  Diagnosis Date  . Anginal pain ([place])   . Cancer ([place])   . CHF (congestive heart failure) ([place]) 01/22/2012  . Chronic fatigue 07/15/2014  . GERD (gastroesophageal reflux disease) 05/12/2017  . Hypertension   . Hypothyroidism   . ILD (interstitial lung disease) ([place]) 02/04/2012  . IPF (idiopathic pulmonary fibrosis) ([place]) 2013  . Mild intermittent asthma 01/29/2016  . Pelvic relaxation   . Prolapse of bladder   . Shortness of breath   . Thyroid disease     Patient Active Problem List   Diagnosis Date Noted  . Gait abnormality   . Chronic diastolic congestive heart failure ([place])   . Acute blood loss anemia   . Anemia of chronic disease   . Leg weakness 01/31/2018  . Thyroid nodule 01/31/2018  . Tachycardia 01/19/2018  . Prediabetes 12/18/2017  . Preventative health care 12/18/2017  . GERD (gastroesophageal reflux disease) 05/12/2017  . Essential hypertension 09/25/2016  . Cough variant asthma 06/13/2016  . Allergic rhinitis 01/29/2016  . Mild intermittent asthma 01/29/2016  . Cough 01/03/2015  . Research subject  08/03/2014  . Chronic fatigue 07/15/2014  . Research study patient 06/28/2014  . IPF (idiopathic pulmonary fibrosis) ([place]) 02/04/2012  . Orthostatic hypotension 01/24/2012  . Normocytic anemia 01/22/2012  . Hyperlipidemia 01/22/2012  . Anemia 01/20/2012  . Hypothyroidism   . Pelvic relaxation 11/11/2011    Past Surgical History:  Procedure Laterality Date  . ANTERIOR AND POSTERIOR REPAIR  12/05/2011   Procedure: ANTERIOR (CYSTOCELE) AND POSTERIOR REPAIR (RECTOCELE);  Surgeon: [name], MD;  Location: [facility];  Service: Gynecology;  Laterality: N/A;  with cysto  . Bladder tact  15 years ago  . LEFT HEART CATHETERIZATION WITH CORONARY ANGIOGRAM N/A 01/22/2012   Procedure: LEFT HEART CATHETERIZATION WITH CORONARY ANGIOGRAM;  Surgeon: [name], MD;  Location: [facility] CATH LAB;  Service: Cardiovascular;  Laterality: N/A;  . NECK SURGERY  2010  . SHOULDER SURGERY    . thyroid disease    . TONSILLECTOMY    . VAGINAL HYSTERECTOMY  15 years ago     OB History    Gravida  2   Para  2   Term  2   Preterm      AB      Living  2     SAB      TAB      Ectopic      Multiple      Live Births               Home Medications    Prior to Admission medications   Medication Sig Start Date  End Date Taking? Authorizing Provider  acetaminophen (TYLENOL) 325 MG tablet Take 325 mg by mouth every 6 (six) hours as needed for headache.   Yes [provider]  aspirin EC 81 MG tablet Take 81 mg by mouth daily.   Yes [provider]  calcium-vitamin D (OSCAL WITH D) 500-200 MG-UNIT per tablet Take 1 tablet by mouth daily.    Yes [provider]  Cranberry 1000 MG CAPS Take 1,000 mg by mouth daily.    Yes [provider]  ESBRIET 267 MG CAPS TAKE 3 CAPSULES BY MOUTH THREE TIMES DAILY WITH FOOD Patient taking differently: Take 801 mg by mouth 3 (three) times daily.  07/15/17  Yes Collene Gobble, MD  levothyroxine (SYNTHROID, LEVOTHROID) 88 MCG  tablet TAKE 1 TABLET BY MOUTH EVERY DAY Patient taking differently: Take 88 mcg by mouth daily before breakfast.  01/05/18  Yes [name], NP  loratadine (CLARITIN) 10 MG tablet Take 10 mg by mouth daily.    Yes [provider]  metoprolol succinate (TOPROL-XL) 25 MG 24 hr tablet Take 1 tablet (25 mg total) by mouth daily. Patient taking differently: Take 12.5 mg by mouth daily after breakfast.  01/30/18  Yes Tonia Ghent, MD  Multiple Vitamin (MULTIVITAMIN WITH MINERALS) TABS Take 1 tablet by mouth daily before breakfast. **Centrum silver**   Yes [provider]  omeprazole (PRILOSEC) 20 MG capsule TAKE 1 CAPSULE (20 MG TOTAL) BY MOUTH DAILY. Patient taking differently: Take 20 mg by mouth daily before breakfast.  12/08/17  Yes Byrum, Rose Fillers, MD    Family History Family History  Problem Relation Age of Onset  . Thyroid disease Mother   . Hypertension Mother   . Coronary artery disease Mother   . Dementia Mother   . Aneurysm Father   . Pulmonary fibrosis Sister   . Stroke Other   . Sleep apnea Other   . Cancer Brother        LIVER  . Pulmonary fibrosis Brother     Social History Social History   Tobacco Use  . Smoking status: Never Smoker  . Smokeless tobacco: Never Used  Substance Use Topics  . Alcohol use: No  . Drug use: No     Allergies   Dilaudid [hydromorphone] and Crestor [rosuvastatin calcium]   Review of Systems Review of Systems  Constitutional:       Per HPI, otherwise negative  HENT:       Per HPI, otherwise negative  Respiratory:       Per HPI, otherwise negative  Cardiovascular:       Per HPI, otherwise negative  Gastrointestinal: Negative for vomiting.  Endocrine:       Negative aside from HPI  Genitourinary:       Neg aside from HPI   Musculoskeletal:       Per HPI, otherwise negative  Skin: Negative.   Neurological: Positive for weakness. Negative for syncope.     Physical Exam Updated Vital Signs BP (!)  167/93   Pulse 78   Temp 97.7 F (36.5 C) (Oral)   Resp 17   Ht 5' 2.5" (1.588 m)   Wt 58.6 kg   SpO2 94%   BMI 23.25 kg/m   Physical Exam  Constitutional: She is oriented to person, place, and time. She has a sickly appearance. No distress.  HENT:  Head: Normocephalic and atraumatic.  Eyes: Conjunctivae and EOM are normal.  Cardiovascular: Normal rate and regular rhythm.  Pulmonary/Chest: Effort normal and breath sounds normal. No stridor. No respiratory distress.  Abdominal: She exhibits no distension.  Musculoskeletal: She exhibits no edema.  Neurological: She is alert and oriented to person, place, and time. No cranial nerve deficit.  Weakness seems more pronounced with flexion of the proximal lower extremity musculature, with seemingly preserved extension of the hip muscle strength, bilaterally, 5/5. Patient has appropriate knee flexion as well diminished extension of each knee strength. Distal sensation symmetric, intact bilaterally.  No cranial nerve deficits, speech is brief, appropriate.   Skin: Skin is warm and dry.  Psychiatric: She has a normal mood and affect.  Nursing note and vitals reviewed.    ED Treatments / Results  Labs (all labs ordered are listed, but only abnormal results are displayed) Labs Reviewed  BASIC METABOLIC PANEL - Abnormal; Notable for the following components:      Result Value   Glucose, Bld 105 (*)    All other components within normal limits  CBC - Abnormal; Notable for the following components:   Hemoglobin 10.9 (*)    MCH 24.9 (*)    All other components within normal limits  CBG MONITORING, ED - Abnormal; Notable for the following components:   Glucose-Capillary 104 (*)    All other components within normal limits  URINALYSIS, ROUTINE W REFLEX MICROSCOPIC  TSH  T4, FREE  SEDIMENTATION RATE  CK    EKG SR, rate 80, PVC, PAC, mild artefact, no overt ischemic changes, abnormal  Radiology Mr Lumbar Spine W Wo Contrast  (assess For Abscess, Cord Compression)  Result Date: 03/15/2018 CLINICAL DATA:  Bilateral lower extremity weakness today. Similar incident in August. EXAM: MRI LUMBAR SPINE WITHOUT AND WITH CONTRAST TECHNIQUE: Multiplanar and multiecho pulse sequences of the lumbar spine were obtained without and with intravenous contrast. CONTRAST:  5 mL Gadavist COMPARISON:  CT abdomen and pelvis 12/18/2013 FINDINGS: Segmentation:  Standard. Alignment: Trace anterolisthesis of L3 on L4 and trace retrolisthesis of L5 on S1. Vertebrae: Mildly heterogeneous bone marrow signal diffusely, non-specific. No suspicious focal osseous lesion, fracture, or significant marrow edema. Chronic degenerative endplate changes at Q2-W9. Conus medullaris and cauda equina: Conus extends to the L1 level. Conus and cauda equina appear normal. Paraspinal and other soft tissues: Unremarkable. Disc levels: Disc desiccation throughout the lumbar spine. Severe disc space narrowing at L5-S1 with mild narrowing from L2-3 to L4-5. L1-2: Minimal disc bulging and slight facet hypertrophy without stenosis. L2-3: Mild disc bulging and moderate facet and ligamentum flavum hypertrophy result in borderline spinal stenosis without neural foraminal stenosis. Small facet joint effusions bilaterally. L3-4: Mild circumferential disc bulging and moderate to severe facet and ligamentum flavum hypertrophy result in mild spinal stenosis and borderline bilateral neural foraminal stenosis. Small facet joint effusions. L4-5: Mild circumferential disc bulging and moderate to severe facet and ligamentum flavum hypertrophy result in mild spinal stenosis and borderline bilateral neural foraminal stenosis. Moderate bilateral facet joint effusions. L5-S1: Disc bulging, endplate spurring, and disc space height loss result in borderline to mild bilateral neural foraminal stenosis and mild left lateral recess stenosis without spinal stenosis. The left S1 nerve root could be affected in  the lateral recess. IMPRESSION: 1. Disc degeneration most advanced at L5-S1 where there is mild left lateral recess and bilateral neural foraminal stenosis. 2. Moderate to severe facet hypertrophy at L3-4 and L4-5 with mild spinal stenosis at both levels. Electronically Signed   By: Logan Bores M.D.   On: 03/15/2018 20:00    Procedures Procedures (including  critical care time)  Medications Ordered in ED Medications  gadobutrol (GADAVIST) 1 MMOL/ML injection 5 mL (5 mLs Intravenous Contrast Given 03/15/18 1935)  methylPREDNISolone sodium succinate (SOLU-MEDROL) 125 mg/2 mL injection 125 mg (125 mg Intravenous Given 03/15/18 2210)  baclofen (LIORESAL) tablet 5 mg (5 mg Oral Given 03/15/18 2339)     Initial Impression / Assessment and Plan / ED Course  I have reviewed the triage vital signs and the nursing notes.  Pertinent labs & imaging results that were available during my care of the patient were reviewed by me and considered in my medical decision making (see chart for details).  After the initial evaluation with consideration the patient's lower extremity weakness I reviewed the patient's chart including MRI results throughout cervical and thoracic region, without lumbar imaging. Subsequently discussed patient's case with our neurologic consultants, who will evaluate the patient.    Update: Patient continues to have weakness after initial Solu-Medrol.   11:39 PM She has been seen and evaluated by neurology, MRI has been completed, no cauda equina, though there is disc degeneration, and some evidence for foraminal stenosis.  Patient remains unable to walk. However, when she is in supine position, she can flex each hip, to bend the knees. She notes that this is a similar condition she was in during her last episode, prior to rehabilitation placement, we discussed options for further care, including this option Patient remains hemodynamically unremarkable, labs unremarkable, no evidence  for cauda equina, no evidence for vascular compromise, and given the history of weakness with resolution and recurrence, this seems unlikely. Patient will benefit from placement in a rehabilitation facility, she understands this disposition. Patient is awaiting morning case management evaluation for assistance with disposition.   Final Clinical Impressions(s) / ED Diagnoses  Lower extremity weakness   Carmin Muskrat, MD 03/15/18 Darci Needle    Carmin Muskrat, MD 04/08/18 [phone]

## 2018-03-16 NOTE — Evaluation (Signed)
Physical Therapy Evaluation Patient Details Name: Tina Calderon MRN: [id number] DOB: 21-Apr-1941 Today's Date: 03/16/2018   History of Present Illness  Pt is a 77 y/o female admitted secondary to sudden onset weakness. Lumbar imaging revelaed disk degeneration at L5-S1. Per notes recent admission with similar symptoms. PMH includes HTN and CHF.   Clinical Impression  Pt admitted secondary to problem above with deficits below. Some differences noted in MMT vs functional mobility tasks. Pt unable to perform SLR in supine, however, was able to lift LEs back to bed when distracted. Was able to perform HEP including heel slides and ankle pumps. Required min to mod A to stand X2 and pt with posterior lean and tremors in standing, therefore further mobility deferred. Feel pt will require increased assist at home and husband unable to physically assist. Feel pt will benefit from short term SNF prior to return home. Will continue to follow acutely to maximize functional mobility independence and safety.     Follow Up Recommendations SNF;Supervision/Assistance - 24 hour    Equipment Recommendations  Rolling walker with 5" wheels    Recommendations for Other Services       Precautions / Restrictions Precautions Precautions: Fall Restrictions Weight Bearing Restrictions: No      Mobility  Bed Mobility Overal bed mobility: Needs Assistance Bed Mobility: Supine to Sit;Sit to Supine     Supine to sit: Min guard Sit to supine: Supervision   General bed mobility comments: Min guard for steadying assist to come to sitting. Supervision for return to supine. Pt able to raise BLE back onto bed when distracted.   Transfers Overall transfer level: Needs assistance Equipment used: 1 person hand held assist Transfers: Sit to/from Stand Sit to Stand: Min assist;Mod assist         General transfer comment: Min to mod A to stand at EOB. Noted tremors once standing and pt with posterior lean,  therefore further mobility deferred. PT stood in front of pt to assist with standing X2.   Ambulation/Gait                Stairs            Wheelchair Mobility    Modified Rankin (Stroke Patients Only)       Balance Overall balance assessment: Needs assistance Sitting-balance support: Feet supported;No upper extremity supported Sitting balance-Leahy Scale: Fair   Postural control: Posterior lean Standing balance support: Bilateral upper extremity supported;During functional activity Standing balance-Leahy Scale: Poor Standing balance comment: Reliant on BUE and external support to stand.                              Pertinent Vitals/Pain Pain Assessment: No/denies pain    Home Living Family/patient expects to be discharged to:: Skilled nursing facility Living Arrangements: Spouse/significant other               Additional Comments: Husband not able to physically assist     Prior Function Level of Independence: Independent               Hand Dominance        Extremity/Trunk Assessment   Upper Extremity Assessment Upper Extremity Assessment: Overall WFL for tasks assessed    Lower Extremity Assessment Lower Extremity Assessment: RLE deficits/detail;LLE deficits/detail RLE Deficits / Details: Pt reports not being able to perform SLR, however, was able to raise BLE back onto bed. Able to perform heel slide and ankle  pump.  LLE Deficits / Details: Pt reports not being able to perform SLR, however, was able to raise BLE back onto bed. Able to perform heel slide and ankle pump.        Communication   Communication: No difficulties  Cognition Arousal/Alertness: Awake/alert Behavior During Therapy: WFL for tasks assessed/performed Overall Cognitive Status: Within Functional Limits for tasks assessed                                        General Comments General comments (skin integrity, edema, etc.): No family  present    Exercises General Exercises - Lower Extremity Ankle Circles/Pumps: AROM;Both;10 reps Heel Slides: AROM;Both;10 reps   Assessment/Plan    PT Assessment Patient needs continued PT services  PT Problem List Decreased strength;Decreased balance;Decreased mobility;Decreased knowledge of use of DME;Decreased knowledge of precautions       PT Treatment Interventions DME instruction;Gait training;Stair training;Functional mobility training;Therapeutic activities;Therapeutic exercise;Balance training;Neuromuscular re-education;Wheelchair mobility training    PT Goals (Current goals can be found in the Care Plan section)  Acute Rehab PT Goals Patient Stated Goal: to go to SNF to get stronger before going home  PT Goal Formulation: With patient Time For Goal Achievement: 03/30/18 Potential to Achieve Goals: Good    Frequency Min 2X/week   Barriers to discharge        Co-evaluation               AM-PAC PT "6 Clicks" Daily Activity  Outcome Measure Difficulty turning over in bed (including adjusting bedclothes, sheets and blankets)?: A Little Difficulty moving from lying on back to sitting on the side of the bed? : Unable Difficulty sitting down on and standing up from a chair with arms (e.g., wheelchair, bedside commode, etc,.)?: Unable Help needed moving to and from a bed to chair (including a wheelchair)?: A Lot Help needed walking in hospital room?: A Lot Help needed climbing 3-5 steps with a railing? : Total 6 Click Score: 10    End of Session Equipment Utilized During Treatment: Gait belt Activity Tolerance: Patient tolerated treatment well Patient left: in bed;with call bell/phone within reach Nurse Communication: Mobility status PT Visit Diagnosis: Unsteadiness on feet (R26.81);History of falling (Z91.81);Muscle weakness (generalized) (M62.81)    Time: 3710-6269 PT Time Calculation (min) (ACUTE ONLY): 10 min   Charges:   PT Evaluation $PT Eval  Moderate Complexity: Novinger, PT, DPT  Acute Rehabilitation Services  Pager: [phone] Office: [phone]   Rudean Hitt 03/16/2018, 11:06 AM

## 2018-03-16 NOTE — ED Notes (Signed)
Social work at bedside.  

## 2018-03-16 NOTE — ED Notes (Signed)
Regular Diet was ordered for Lunch.

## 2018-03-16 NOTE — ED Notes (Addendum)
Patient had 51 pills of Pirfenidone CAPS at bedside; RN counted pill in front of patient and medication to be taken to Pharmacy to dispense as patient is scheduled for med; Patient aware that med can not be kept at bedside; Patient states medication is $96,000 yearly; Patient request med be sent up to be taken when she eats breakfast, lunch, and dinner-Monique,RN

## 2018-03-16 NOTE — Discharge Planning (Signed)
[facility] consulted regarding rehab placement.  EDCM spoke with pt at bedside to find that she has been to [facility] and discharged home with home health services but not sure of company. EDCM contracted [facility] to find that [facility] was set up through [facility].  EDCM has call in to [facility] for update on status and if patient can resume care.  Will update team as information comes available.

## 2018-03-16 NOTE — NC FL2 (Signed)
[place] LEVEL OF CARE SCREENING TOOL     IDENTIFICATION  Patient Name: Tina Calderon Birthdate: 1941-04-15 Sex: female Admission Date (Current Location): 03/15/2018  [facility] and [place] Number:  Herbalist and Address:  [facility], [place] [address], [place], [place] [address]      Provider Number: [id number]  Attending Physician Name and Address:  Default, Provider, MD  Relative Name and Phone Number:       Current Level of Care: Hospital Recommended Level of Care: [place] Prior Approval Number:    Date Approved/Denied:   PASRR Number:   [id number]  Discharge Plan: SNF    Current Diagnoses: Patient Active Problem List   Diagnosis Date Noted  . Gait abnormality   . Chronic diastolic congestive heart failure ([place])   . Acute blood loss anemia   . Anemia of chronic disease   . Leg weakness 01/31/2018  . Thyroid nodule 01/31/2018  . Tachycardia 01/19/2018  . Prediabetes 12/18/2017  . Preventative health care 12/18/2017  . GERD (gastroesophageal reflux disease) 05/12/2017  . Essential hypertension 09/25/2016  . Cough variant asthma 06/13/2016  . Allergic rhinitis 01/29/2016  . Mild intermittent asthma 01/29/2016  . Cough 01/03/2015  . Research subject 08/03/2014  . Chronic fatigue 07/15/2014  . Research study patient 06/28/2014  . IPF (idiopathic pulmonary fibrosis) ([place]) 02/04/2012  . Orthostatic hypotension 01/24/2012  . Normocytic anemia 01/22/2012  . Hyperlipidemia 01/22/2012  . Anemia 01/20/2012  . Hypothyroidism   . Pelvic relaxation 11/11/2011    Orientation RESPIRATION BLADDER Height & Weight     Self, Time, Situation, Place  Normal Continent Weight: 58.6 kg Height:  5' 2.5" (158.8 cm)  BEHAVIORAL SYMPTOMS/MOOD NEUROLOGICAL BOWEL NUTRITION STATUS      Continent Diet(please see AVS summary. )  AMBULATORY STATUS COMMUNICATION OF NEEDS Skin   Extensive Assist Verbally Normal                        Personal Care Assistance Level of Assistance  Bathing, Feeding, Dressing Bathing Assistance: Maximum assistance Feeding assistance: Limited assistance Dressing Assistance: Maximum assistance Total Care Assistance: Limited assistance   Functional Limitations Info  Sight, Hearing, Speech Sight Info: Adequate Hearing Info: Adequate Speech Info: Adequate    SPECIAL CARE FACTORS FREQUENCY  PT (By licensed PT), OT (By licensed OT)     PT Frequency: 5 times a week  OT Frequency: 5 times a week             Contractures Contractures Info: Not present    Additional Factors Info  Code Status, Allergies Code Status Info: Prior  Allergies Info: Dilaudid Hydromorphone, Crestor Rosuvastatin Calcium           Current Medications (03/16/2018):  This is the current hospital active medication list Current Facility-Administered Medications  Medication Dose Route Frequency Provider Last Rate Last Dose  . acetaminophen (TYLENOL) tablet 325 mg  325 mg Oral Q6H PRN Carmin Muskrat, MD      . aspirin EC tablet 81 mg  81 mg Oral Daily Carmin Muskrat, MD      . baclofen (LIORESAL) tablet 5 mg  5 mg Oral TID Carmin Muskrat, MD   5 mg at 03/16/18 0820  . calcium-vitamin D (OSCAL WITH D) 500-200 MG-UNIT per tablet 1 tablet  1 tablet Oral Daily Carmin Muskrat, MD      . levothyroxine (SYNTHROID, LEVOTHROID) tablet 88 mcg  88 mcg Oral QAC  breakfast Carmin Muskrat, MD   88 mcg at 03/16/18 [phone]  . loratadine (CLARITIN) tablet 10 mg  10 mg Oral Daily Carmin Muskrat, MD      . metoprolol succinate (TOPROL-XL) 24 hr tablet 12.5 mg  12.5 mg Oral QPC breakfast Carmin Muskrat, MD   12.5 mg at 03/16/18 3912  . multivitamin with minerals tablet 1 tablet  1 tablet Oral QAC breakfast Carmin Muskrat, MD   1 tablet at 03/16/18 0820  . pantoprazole (PROTONIX) EC tablet 40 mg  40 mg Oral Daily Carmin Muskrat, MD      . Pirfenidone CAPS 801 mg  801 mg Oral TID Carmin Muskrat, MD        Current Outpatient Medications  Medication Sig Dispense Refill  . acetaminophen (TYLENOL) 325 MG tablet Take 325 mg by mouth every 6 (six) hours as needed for headache.    [name] aspirin EC 81 MG tablet Take 81 mg by mouth daily.    . calcium-vitamin D (OSCAL WITH D) 500-200 MG-UNIT per tablet Take 1 tablet by mouth daily.     . Cranberry 1000 MG CAPS Take 1,000 mg by mouth daily.     . ESBRIET 267 MG CAPS TAKE 3 CAPSULES BY MOUTH THREE TIMES DAILY WITH FOOD (Patient taking differently: Take 801 mg by mouth 3 (three) times daily. ) 270 capsule 3  . levothyroxine (SYNTHROID, LEVOTHROID) 88 MCG tablet TAKE 1 TABLET BY MOUTH EVERY DAY (Patient taking differently: Take 88 mcg by mouth daily before breakfast. ) 90 tablet 1  . loratadine (CLARITIN) 10 MG tablet Take 10 mg by mouth daily.     . metoprolol succinate (TOPROL-XL) 25 MG 24 hr tablet Take 1 tablet (25 mg total) by mouth daily. (Patient taking differently: Take 12.5 mg by mouth daily after breakfast. )    . Multiple Vitamin (MULTIVITAMIN WITH MINERALS) TABS Take 1 tablet by mouth daily before breakfast. **Centrum silver**    . omeprazole (PRILOSEC) 20 MG capsule TAKE 1 CAPSULE (20 MG TOTAL) BY MOUTH DAILY. (Patient taking differently: Take 20 mg by mouth daily before breakfast. ) 90 capsule 1     Discharge Medications: Please see discharge summary for a list of discharge medications.  Relevant Imaging Results:  Relevant Lab Results:   Additional Information [phone].  Wetzel Bjornstad, LCSWA

## 2018-03-16 NOTE — ED Notes (Signed)
Breakfast tray at bedside 

## 2018-03-16 NOTE — ED Notes (Signed)
Patient received dinner tray 

## 2018-03-16 NOTE — ED Notes (Signed)
Ordered breakfast Tray

## 2018-03-16 NOTE — ED Notes (Signed)
Ordered regular dinner tray for pt.

## 2018-03-16 NOTE — Progress Notes (Addendum)
11:50am- CSW spoke with Tiffany from [facility] regarding pt. CSW aware that Tina Calderon is starting auth with [employer] at this time. CSW verbalized the urgency of getting this auth as pt is not being admitted and is holding for placement in the ED. Tiffany to follow up with CSW once Tina Calderon has been received.   8:38am-CSW has faxed over information to [facility] at this time.   CSW spoke with Tiffany from [facility] and was informed that pt was there in August 2019 and only used 8 days of [facility] and can come back if needed . CSW explained this information to pt and pt expressed the desire to return to this facility. CSW informed pt that pt would need to be seen by PT before facility could start auth. At this time CSW has explained to pt that [facility] may or may not approve and that the process of authorization may take a few hours to days. Pt agreeable and understanding at this time. CSW to fax pt out to [facility].    Tina Calderon Tina Calderon, MSW, [place] [facility] Clinical Social Worker [phone]

## 2018-03-16 NOTE — ED Provider Notes (Signed)
Awaiting placement in rehab.  No current problems per nursing.   Daleen Bo, MD 03/16/18 [phone]

## 2018-03-17 ENCOUNTER — Other Ambulatory Visit: Payer: Self-pay

## 2018-03-17 MED ORDER — PIRFENIDONE 267 MG PO CAPS
801.0000 mg | ORAL_CAPSULE | Freq: Three times a day (TID) | ORAL | Status: DC
  Administered 2018-03-17 (×3): 801 mg via ORAL
  Filled 2018-03-17 (×2): qty 3

## 2018-03-17 NOTE — ED Notes (Signed)
Pt lying on bed talking on her cell phone. Aware SW checking on delay w/[facility].

## 2018-03-17 NOTE — ED Provider Notes (Signed)
Resting comfortably in bed. Alert. No distress denies complaint   Orlie Dakin, MD 03/17/18 1144

## 2018-03-17 NOTE — ED Notes (Signed)
Patient was given [employer], Catheryn Bacon and Engineer, mining for [employer]

## 2018-03-17 NOTE — ED Notes (Signed)
[facility] advised they should be arriving to transport pt in approx 1 hour d/t facility being in [place].

## 2018-03-17 NOTE — ED Notes (Addendum)
Tina Calderon, and pt aware delay w/[facility]. Pt requested to be given Pirfenidone caps - given = total count left - 42.

## 2018-03-17 NOTE — Progress Notes (Addendum)
10:37am-CSW went to speak with pt at bedside to give update on placement. CSW was asked by pt to assist pt in calling [facility] as pt needs to reschedule appointment. CSW spoke with [facility] to assist pt in doing so. Pt changed appointment form 03/18/18 to 03/26/18.Pt expressed that pt would update facility of appointment once admitted to facility.  9:09am-CSW followed up with [facility] speaking with Magda Paganini. CSW was informed that no authorization had been received on pt at this time however Magda Paganini would have Tiffany call CSW back today. CSW was advised by Magda Paganini that the facility should get authorization today but would call CSW once received.   CSW aware that pt remains in the ED due to insurance authorization with [facility]. CSW to follow up with facility admissions -Tiffany once she has arrived. CSW will continue to follow for further placement needs.    Virgie Dad Adelia Baptista, MSW, [place] [facility] Clinical Social Worker [phone]

## 2018-03-17 NOTE — ED Notes (Signed)
Pt dressed herself.

## 2018-03-17 NOTE — ED Notes (Signed)
Pt advised SW she wants to wait to go to [facility] after lunch.

## 2018-03-17 NOTE — Progress Notes (Signed)
CSW received call that authorization has been received at [facility]. CSW update pt at this time where pt expressed that pt would like to go after eating lunch. CSW has set [facility] up for 2pm. RN to gather pt's medications from he pharmacy. RN to call report to [phone] and pt will go to room 405. CSW has updated MD and asked for AVS so that CSW can fax it over to facility at this time.    Virgie Dad Saranda Legrande, MSW, [place] [facility] Clinical Social Worker [phone]

## 2018-03-17 NOTE — Progress Notes (Signed)
CSW called [facility] to check on status of ride. Pt is 4th on the list.   Wendelyn Breslow, [facility]  [phone]

## 2018-03-17 NOTE — Telephone Encounter (Signed)
Tina Calderon, can you help her with rescheduling the thyroid biopsy? She was in the hospital for lower extremity weakness and is now in rehabilitation. We need to reschedule the thyroid biopsy.

## 2018-03-17 NOTE — ED Notes (Signed)
Pt eating lunch. Pt has all of her belongings in her room except home med - Pirfenidone. RN picked up from pharmacy - 45 pills remain d/t dispensed to pt from pharmacy. Pt aware. Pt also aware [facility] should be arriving around 1400 to transport her to [facility].

## 2018-03-17 NOTE — Telephone Encounter (Signed)
Called Tina Calderon today and she had already called [facility] to get rescheduled for her Thyroid Biopsy.

## 2018-03-17 NOTE — ED Notes (Signed)
Case Manager at [employer]

## 2018-03-17 NOTE — ED Notes (Signed)
Regular Diet was ordered for Lunch. 

## 2018-03-18 ENCOUNTER — Other Ambulatory Visit: Payer: Medicare Other

## 2018-03-18 ENCOUNTER — Telehealth (HOSPITAL_COMMUNITY): Payer: Self-pay | Admitting: *Deleted

## 2018-03-18 NOTE — Telephone Encounter (Signed)
Patient given detailed instructions per Myocardial Perfusion Study Information Sheet for the test on 03/23/18. Patient notified to arrive 15 minutes early and that it is imperative to arrive on time for appointment to keep from having the test rescheduled.  If you need to cancel or reschedule your appointment, please call the office within 24 hours of your appointment. . Patient verbalized understanding.Tina Calderon

## 2018-03-19 NOTE — Telephone Encounter (Signed)
Called pt after receiving a mychart message for Dr.Christopher to call her asap. Adv pt that mychart is not the best way to communicate if she is needing something that is time sensitive or urgent.  Pt wants to let Dr.Christopher know that she has to cancel her stress test schedule for 03/23/18. Pt sts that she has a repeat of the episode that she had back in August. She is currently at a rehab facility [facility]. She was advise by her PT that she should reschedule her stress test since she is having leg weakness.  Adv pt that I will fwd the update to Dr.Christopehr and her nurse Alisha.

## 2018-03-20 NOTE — Telephone Encounter (Signed)
Spoke with pt and informed that Dr. Harrell Gave has been made aware and she is ok for her to schedule test for future. Pt advised to contact office when she's ready to schedule. Pt verbalized understanding.

## 2018-03-20 NOTE — Telephone Encounter (Signed)
Thank you for the message. We can reschedule her stress test for the future. I hope she recovers well.

## 2018-03-23 ENCOUNTER — Encounter (HOSPITAL_COMMUNITY): Payer: Medicare Other

## 2018-03-23 ENCOUNTER — Other Ambulatory Visit (HOSPITAL_COMMUNITY): Payer: Medicare Other

## 2018-03-26 ENCOUNTER — Other Ambulatory Visit: Payer: Medicare Other

## 2018-03-30 ENCOUNTER — Telehealth (HOSPITAL_COMMUNITY): Payer: Self-pay | Admitting: *Deleted

## 2018-03-30 NOTE — Telephone Encounter (Signed)
Patient given detailed instructions per Myocardial Perfusion Study Information Sheet for the test on 04/01/18 at 0745. Patient notified to arrive 15 minutes early and that it is imperative to arrive on time for appointment to keep from having the test rescheduled.  If you need to cancel or reschedule your appointment, please call the office within 24 hours of your appointment. . Patient verbalized understanding.Dorethea Strubel, [place]anae [place]

## 2018-03-31 ENCOUNTER — Other Ambulatory Visit (HOSPITAL_COMMUNITY)
Admission: RE | Admit: 2018-03-31 | Discharge: 2018-03-31 | Disposition: A | Discharge status: Home / Self Care | Payer: Medicare Other | Source: Ambulatory Visit | Attending: Student | Admitting: Student

## 2018-03-31 ENCOUNTER — Ambulatory Visit
Admission: RE | Admit: 2018-03-31 | Discharge: 2018-03-31 | Disposition: A | Discharge status: Home / Self Care | Payer: Medicare Other | Source: Ambulatory Visit | Attending: Primary Care | Admitting: Primary Care

## 2018-03-31 DIAGNOSIS — E041 Nontoxic single thyroid nodule: Secondary | ICD-10-CM

## 2018-04-01 ENCOUNTER — Ambulatory Visit (HOSPITAL_COMMUNITY): Payer: Medicare Other

## 2018-04-01 ENCOUNTER — Ambulatory Visit (HOSPITAL_COMMUNITY): Payer: Medicare Other | Attending: Cardiology

## 2018-04-01 ENCOUNTER — Encounter (HOSPITAL_COMMUNITY): Payer: Self-pay

## 2018-04-01 ENCOUNTER — Other Ambulatory Visit (HOSPITAL_COMMUNITY): Payer: Self-pay | Admitting: Cardiology

## 2018-04-01 ENCOUNTER — Other Ambulatory Visit: Payer: Self-pay | Admitting: Cardiology

## 2018-04-01 VITALS — Ht 62.0 in | Wt 129.0 lb

## 2018-04-01 DIAGNOSIS — I5032 Chronic diastolic (congestive) heart failure: Secondary | ICD-10-CM | POA: Diagnosis present

## 2018-04-01 DIAGNOSIS — R9431 Abnormal electrocardiogram [ECG] [EKG]: Secondary | ICD-10-CM | POA: Diagnosis present

## 2018-04-01 DIAGNOSIS — R0600 Dyspnea, unspecified: Secondary | ICD-10-CM

## 2018-04-01 LAB — MYOCARDIAL PERFUSION IMAGING
CHL CUP MPHR: 142 {beats}/min
CHL CUP NUCLEAR SDS: 4
CHL RATE OF PERCEIVED EXERTION: 19
CSEPED: 4 min
CSEPEW: 3.2 METS
Exercise duration (sec): 45 s
LV sys vol: 14 mL
LVDIAVOL: 43 mL (ref 46–106)
Peak HR: 133 {beats}/min
Percent HR: 93 %
Rest HR: 66 {beats}/min
SRS: 1
SSS: 5
TID: 0.87

## 2018-04-01 MED ORDER — TECHNETIUM TC 99M TETROFOSMIN IV KIT
32.4000 | PACK | Freq: Once | INTRAVENOUS | Status: AC | PRN
  Administered 2018-04-01: 32.4 via INTRAVENOUS
  Filled 2018-04-01: qty 33

## 2018-04-01 MED ORDER — TECHNETIUM TC 99M TETROFOSMIN IV KIT
10.7000 | PACK | Freq: Once | INTRAVENOUS | Status: AC | PRN
  Administered 2018-04-01: 10.7 via INTRAVENOUS
  Filled 2018-04-01: qty 11

## 2018-04-13 ENCOUNTER — Other Ambulatory Visit: Payer: Self-pay | Admitting: Primary Care

## 2018-04-13 DIAGNOSIS — R000 Tachycardia, unspecified: Secondary | ICD-10-CM

## 2018-04-14 NOTE — Telephone Encounter (Signed)
Last prescribed on 01/30/2018 by Dr Damita Dunnings Last office visit on 02/20/2018

## 2018-04-14 NOTE — Telephone Encounter (Signed)
Noted, refill sent to pharmacy. 

## 2018-04-17 ENCOUNTER — Encounter: Payer: Self-pay | Admitting: Primary Care

## 2018-04-17 ENCOUNTER — Ambulatory Visit: Payer: Medicare Other | Admitting: Primary Care

## 2018-04-17 DIAGNOSIS — E041 Nontoxic single thyroid nodule: Secondary | ICD-10-CM | POA: Diagnosis not present

## 2018-04-17 DIAGNOSIS — R29898 Other symptoms and signs involving the musculoskeletal system: Secondary | ICD-10-CM

## 2018-04-17 DIAGNOSIS — I10 Essential (primary) hypertension: Secondary | ICD-10-CM | POA: Diagnosis not present

## 2018-04-17 MED ORDER — AMLODIPINE BESYLATE 5 MG PO TABS: 5.0000 mg | ORAL_TABLET | Freq: Every day | ORAL | 0 refills | Status: DC

## 2018-04-17 NOTE — Progress Notes (Signed)
Subjective:    Patient ID: Tina Calderon, female    DOB: [birth year], 77 y.o.   MRN: [id number]  HPI  Tina Calderon is a 77 year old female who presents today for emergency department and nursing rehab follow up.  She presented to [facility] on 03/15/18 with complaints of lower extremity weakness. She had a prior admission for same nearly one month prior. During her most recent visit she endorsed sudden onset of bilateral lower extremity weakness four hours prior, required assistance lowering herself to the ground.  During her stay in the ED she underwent MRI of the lumbar spine which was with disc degeneration, some foraminal stenosis. She underwent MRI of the cervical and thoracic spine in late September without acute issues. T-6-T8 with dilation to central spinal canal, no lesion. She was provided with IV solu-medrol and didn't notice improvement with weakness.   She was consulted by neurology given inability to walk. Labs unremarkable, no evidence of cauda equina or vascular compromise. She was admitted to nursing rehabilitation.   She was admitted to inpatient rehab on October 6-8th, then [facility] from October 8th through 25th. She underwent both occupation and physical therapy during her stay in [facility], felt improved. She does endorse feeling tired to her legs, denies weakness, difficulty walking.   She stopped doing outpatient therapy for pulmonary fibrosis. She is involved with home health PT through [facility] once weekly, nursing staff is coming out twice weekly.   BP Readings from Last 3 Encounters:  04/17/18 (!) 144/74  03/17/18 (!) 175/95  03/05/18 140/70   She endorses a mid occipital headache since her most recent hospital visit. She's been checking her BP at home which is running 140's/70's. She denies dizziness.   Review of Systems  Respiratory: Negative for shortness of breath.   Cardiovascular: Negative for chest pain.  Neurological: Negative for weakness and  numbness.       Past Medical History:  Diagnosis Date  . Anginal pain ([place])   . Cancer ([place])   . CHF (congestive heart failure) ([place]) 01/22/2012  . Chronic fatigue 07/15/2014  . GERD (gastroesophageal reflux disease) 05/12/2017  . Hypertension   . Hypothyroidism   . ILD (interstitial lung disease) ([place]) 02/04/2012  . IPF (idiopathic pulmonary fibrosis) ([place]) 2013  . Mild intermittent asthma 01/29/2016  . Pelvic relaxation   . Prolapse of bladder   . Shortness of breath   . Thyroid disease      Social History   Socioeconomic History  . Marital status: Married    Spouse name: Not on file  . Number of children: Not on file  . Years of education: Not on file  . Highest education level: Not on file  Occupational History  . Not on file  Social Needs  . Financial resource strain: Not on file  . Food insecurity:    Worry: Not on file    Inability: Not on file  . Transportation needs:    Medical: Not on file    Non-medical: Not on file  Tobacco Use  . Smoking status: Never Smoker  . Smokeless tobacco: Never Used  Substance and Sexual Activity  . Alcohol use: No  . Drug use: No  . Sexual activity: Never    Birth control/protection: Post-menopausal, Surgical    Comment: HYST  Lifestyle  . Physical activity:    Days per week: Not on file    Minutes per session: Not on file  . Stress: Not on file  Relationships  . Social connections:    Talks on phone: Not on file    Gets together: Not on file    Attends religious service: Not on file    Active member of club or organization: Not on file    Attends meetings of clubs or organizations: Not on file    Relationship status: Not on file  . Intimate partner violence:    Fear of current or ex partner: Not on file    Emotionally abused: Not on file    Physically abused: Not on file    Forced sexual activity: Not on file  Other Topics Concern  . Not on file  Social History Narrative   Married.   2 children, 5 grandchildren.     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   Enjoys puzzles, sewing, traveling, reading.     Past Surgical History:  Procedure Laterality Date  . ANTERIOR AND POSTERIOR REPAIR  12/05/2011   Procedure: ANTERIOR (CYSTOCELE) AND POSTERIOR REPAIR (RECTOCELE);  Surgeon: [name], MD;  Location: [facility];  Service: Gynecology;  Laterality: N/A;  with cysto  . Bladder tact  15 years ago  . LEFT HEART CATHETERIZATION WITH CORONARY ANGIOGRAM N/A 01/22/2012   Procedure: LEFT HEART CATHETERIZATION WITH CORONARY ANGIOGRAM;  Surgeon: [name], MD;  Location: [facility] CATH LAB;  Service: Cardiovascular;  Laterality: N/A;  . NECK SURGERY  2010  . SHOULDER SURGERY    . thyroid disease    . TONSILLECTOMY    . VAGINAL HYSTERECTOMY  15 years ago    Family History  Problem Relation Age of Onset  . Thyroid disease Mother   . Hypertension Mother   . Coronary artery disease Mother   . Dementia Mother   . Aneurysm Father   . Pulmonary fibrosis Sister   . Stroke Other   . Sleep apnea Other   . Cancer Brother        LIVER  . Pulmonary fibrosis Brother     Allergies  Allergen Reactions  . Dilaudid [Hydromorphone] Nausea And Vomiting  . Crestor [Rosuvastatin Calcium] Other (See Comments)    Myalgias, muscle weakness    Current Outpatient Medications on File Prior to Visit  Medication Sig Dispense Refill  . acetaminophen (TYLENOL) 325 MG tablet Take 325 mg by mouth every 6 (six) hours as needed for headache.    [name] aspirin EC 81 MG tablet Take 81 mg by mouth daily.    . calcium-vitamin D (OSCAL WITH D) 500-200 MG-UNIT per tablet Take 1 tablet by mouth daily.     . Cranberry 1000 MG CAPS Take 1,000 mg by mouth daily.     . ESBRIET 267 MG CAPS TAKE 3 CAPSULES BY MOUTH THREE TIMES DAILY WITH FOOD (Patient taking differently: Take 801 mg by mouth 3 (three) times daily. ) 270 capsule 3  . levothyroxine (SYNTHROID, LEVOTHROID) 88 MCG tablet TAKE 1 TABLET BY MOUTH EVERY DAY (Patient taking differently: Take  88 mcg by mouth daily before breakfast. ) 90 tablet 1  . loratadine (CLARITIN) 10 MG tablet Take 10 mg by mouth daily.     . metoprolol succinate (TOPROL-XL) 25 MG 24 hr tablet TAKE 1/2 TABLET BY MOUTH EVERY DAY 45 tablet 1  . Multiple Vitamin (MULTIVITAMIN WITH MINERALS) TABS Take 1 tablet by mouth daily before breakfast. **Centrum silver**    . omeprazole (PRILOSEC) 20 MG capsule TAKE 1 CAPSULE (20 MG TOTAL) BY MOUTH DAILY. (Patient taking differently: Take 20 mg by mouth daily before  breakfast. ) 90 capsule 1   No current facility-administered medications on file prior to visit.     BP (!) 144/74   Pulse 64   Temp 98 F (36.7 C) (Oral)   Ht _0  (1.575 m)   Wt 128 lb 4 oz (58.2 kg)   SpO2 98%   BMI 23.46 kg/m    Objective:   Physical Exam  Constitutional: She is oriented to person, place, and time. She appears well-nourished.  Neck: Neck supple.  Cardiovascular: Normal rate and regular rhythm.  Respiratory: Effort normal and breath sounds normal.  Musculoskeletal:  5/5 strength to bilateral lower extremities. Able to get up and down from exam table without difficulty. Ambulatory in office without difficulty and without assistive device.   Neurological: She is alert and oriented to person, place, and time.  Reflex Scores:      Patellar reflexes are 2+ on the right side and 2+ on the left side. Skin: Skin is warm and dry.  Psychiatric: She has a normal mood and affect.           Assessment & Plan:

## 2018-04-17 NOTE — Assessment & Plan Note (Signed)
Overall seems non suspicious. Awaiting cytology results.

## 2018-04-17 NOTE — Assessment & Plan Note (Signed)
Daily headaches since recent hospital visit. BP borderline with evidence of high readings.   Continue metoprolol succinate 25 mg. Add amlodipine 5 mg. Follow up in 3 weeks for BP check.

## 2018-04-17 NOTE — Patient Instructions (Signed)
Start amlodipine 5 mg tablets for high blood pressure. Take 1 tablet by mouth once daily.   Continue taking metoprolol succinate 25 mg daily.  You will be contacted regarding your referral to neurology.  Please let us know if you have not been contacted within one week.   Schedule a follow up visit in 3 weeks for BP check.  It was a pleasure to see you today!

## 2018-04-17 NOTE — Assessment & Plan Note (Signed)
Bilaterally, second hospital visit and rehab stay.  Continue with home health PT.  Referral placed for neurology evaluation. Exam today unremarkable.   All hospital notes, labs, imaging reviewed.

## 2018-04-18 ENCOUNTER — Emergency Department (HOSPITAL_COMMUNITY): Payer: Medicare Other

## 2018-04-18 ENCOUNTER — Encounter (HOSPITAL_COMMUNITY): Payer: Self-pay | Admitting: Internal Medicine

## 2018-04-18 ENCOUNTER — Inpatient Hospital Stay (HOSPITAL_COMMUNITY)
Admission: EM | Admit: 2018-04-18 | Discharge: 2018-04-21 | DRG: 065 | Disposition: A | Discharge status: Skilled Nursing Facility | Payer: Medicare Other | Attending: Internal Medicine | Admitting: Internal Medicine

## 2018-04-18 DIAGNOSIS — I639 Cerebral infarction, unspecified: Secondary | ICD-10-CM

## 2018-04-18 DIAGNOSIS — R29898 Other symptoms and signs involving the musculoskeletal system: Secondary | ICD-10-CM | POA: Diagnosis present

## 2018-04-18 DIAGNOSIS — I10 Essential (primary) hypertension: Secondary | ICD-10-CM | POA: Diagnosis present

## 2018-04-18 DIAGNOSIS — I110 Hypertensive heart disease with heart failure: Secondary | ICD-10-CM | POA: Diagnosis present

## 2018-04-18 DIAGNOSIS — R4782 Fluency disorder in conditions classified elsewhere: Secondary | ICD-10-CM | POA: Diagnosis present

## 2018-04-18 DIAGNOSIS — Z8673 Personal history of transient ischemic attack (TIA), and cerebral infarction without residual deficits: Secondary | ICD-10-CM | POA: Diagnosis present

## 2018-04-18 DIAGNOSIS — R29705 NIHSS score 5: Secondary | ICD-10-CM | POA: Diagnosis present

## 2018-04-18 DIAGNOSIS — Z8249 Family history of ischemic heart disease and other diseases of the circulatory system: Secondary | ICD-10-CM

## 2018-04-18 DIAGNOSIS — R531 Weakness: Secondary | ICD-10-CM

## 2018-04-18 DIAGNOSIS — E039 Hypothyroidism, unspecified: Secondary | ICD-10-CM | POA: Diagnosis present

## 2018-04-18 DIAGNOSIS — R471 Dysarthria and anarthria: Secondary | ICD-10-CM | POA: Diagnosis present

## 2018-04-18 DIAGNOSIS — F8081 Childhood onset fluency disorder: Secondary | ICD-10-CM | POA: Diagnosis not present

## 2018-04-18 DIAGNOSIS — J84112 Idiopathic pulmonary fibrosis: Secondary | ICD-10-CM | POA: Diagnosis present

## 2018-04-18 DIAGNOSIS — J4520 Mild intermittent asthma, uncomplicated: Secondary | ICD-10-CM | POA: Diagnosis present

## 2018-04-18 DIAGNOSIS — Z8349 Family history of other endocrine, nutritional and metabolic diseases: Secondary | ICD-10-CM

## 2018-04-18 DIAGNOSIS — I5032 Chronic diastolic (congestive) heart failure: Secondary | ICD-10-CM | POA: Diagnosis not present

## 2018-04-18 DIAGNOSIS — K219 Gastro-esophageal reflux disease without esophagitis: Secondary | ICD-10-CM | POA: Diagnosis present

## 2018-04-18 DIAGNOSIS — G8311 Monoplegia of lower limb affecting right dominant side: Secondary | ICD-10-CM | POA: Diagnosis present

## 2018-04-18 DIAGNOSIS — Z800 Family history of malignant neoplasm of digestive organs: Secondary | ICD-10-CM

## 2018-04-18 DIAGNOSIS — E785 Hyperlipidemia, unspecified: Secondary | ICD-10-CM | POA: Diagnosis not present

## 2018-04-18 DIAGNOSIS — D649 Anemia, unspecified: Secondary | ICD-10-CM | POA: Diagnosis present

## 2018-04-18 DIAGNOSIS — Z7982 Long term (current) use of aspirin: Secondary | ICD-10-CM

## 2018-04-18 HISTORY — DX: Cerebral infarction, unspecified: I63.9

## 2018-04-18 LAB — DIFFERENTIAL
Abs Immature Granulocytes: 0.01 10*3/uL (ref 0.00–0.07)
BASOS PCT: 1 %
Basophils Absolute: 0 10*3/uL (ref 0.0–0.1)
EOS ABS: 0.2 10*3/uL (ref 0.0–0.5)
Eosinophils Relative: 2 %
Immature Granulocytes: 0 %
Lymphocytes Relative: 12 %
Lymphs Abs: 0.8 10*3/uL (ref 0.7–4.0)
Monocytes Absolute: 0.5 10*3/uL (ref 0.1–1.0)
Monocytes Relative: 7 %
NEUTROS ABS: 5.2 10*3/uL (ref 1.7–7.7)
NEUTROS PCT: 78 %

## 2018-04-18 LAB — URINALYSIS, ROUTINE W REFLEX MICROSCOPIC
Bilirubin Urine: NEGATIVE
Glucose, UA: NEGATIVE mg/dL
Hgb urine dipstick: NEGATIVE
Ketones, ur: NEGATIVE mg/dL
Leukocytes, UA: NEGATIVE
Nitrite: NEGATIVE
PROTEIN: NEGATIVE mg/dL
SPECIFIC GRAVITY, URINE: 1.015 (ref 1.005–1.030)
pH: 8 (ref 5.0–8.0)

## 2018-04-18 LAB — I-STAT CHEM 8, ED
BUN: 11 mg/dL (ref 8–23)
CREATININE: 0.8 mg/dL (ref 0.44–1.00)
Calcium, Ion: 1.08 mmol/L — ABNORMAL LOW (ref 1.15–1.40)
Chloride: 100 mmol/L (ref 98–111)
Glucose, Bld: 107 mg/dL — ABNORMAL HIGH (ref 70–99)
HEMATOCRIT: 37 % (ref 36.0–46.0)
Hemoglobin: 12.6 g/dL (ref 12.0–15.0)
Potassium: 4.3 mmol/L (ref 3.5–5.1)
Sodium: 137 mmol/L (ref 135–145)
TCO2: 30 mmol/L (ref 22–32)

## 2018-04-18 LAB — CBC
HCT: 36.2 % (ref 36.0–46.0)
Hemoglobin: 10.9 g/dL — ABNORMAL LOW (ref 12.0–15.0)
MCH: 24.5 pg — ABNORMAL LOW (ref 26.0–34.0)
MCHC: 30.1 g/dL (ref 30.0–36.0)
MCV: 81.5 fL (ref 80.0–100.0)
NRBC: 0 % (ref 0.0–0.2)
Platelets: 205 10*3/uL (ref 150–400)
RBC: 4.44 MIL/uL (ref 3.87–5.11)
RDW: 15 % (ref 11.5–15.5)
WBC: 6.8 10*3/uL (ref 4.0–10.5)

## 2018-04-18 LAB — COMPREHENSIVE METABOLIC PANEL
ALT: 12 U/L (ref 0–44)
AST: 23 U/L (ref 15–41)
Albumin: 3.8 g/dL (ref 3.5–5.0)
Alkaline Phosphatase: 52 U/L (ref 38–126)
Anion gap: 7 (ref 5–15)
BUN: 11 mg/dL (ref 8–23)
CO2: 26 mmol/L (ref 22–32)
CREATININE: 0.98 mg/dL (ref 0.44–1.00)
Calcium: 8.8 mg/dL — ABNORMAL LOW (ref 8.9–10.3)
Chloride: 103 mmol/L (ref 98–111)
GFR calc Af Amer: >60 mL/min (ref >60)
GFR calc non Af Amer: 54 mL/min — ABNORMAL LOW (ref >60)
Glucose, Bld: 109 mg/dL — ABNORMAL HIGH (ref 70–99)
POTASSIUM: 4.3 mmol/L (ref 3.5–5.1)
Sodium: 136 mmol/L (ref 135–145)
Total Bilirubin: 0.5 mg/dL (ref 0.3–1.2)
Total Protein: 6.4 g/dL — ABNORMAL LOW (ref 6.5–8.1)

## 2018-04-18 LAB — PROTIME-INR
INR: 1.03
Prothrombin Time: 13.4 seconds (ref 11.4–15.2)

## 2018-04-18 LAB — APTT: APTT: 29 s (ref 24–36)

## 2018-04-18 LAB — PHOSPHORUS: Phosphorus: 3 mg/dL (ref 2.5–4.6)

## 2018-04-18 LAB — CBG MONITORING, ED: Glucose-Capillary: 97 mg/dL (ref 70–99)

## 2018-04-18 LAB — I-STAT TROPONIN, ED: Troponin i, poc: 0 ng/mL (ref 0.00–0.08)

## 2018-04-18 LAB — MAGNESIUM: Magnesium: 1.8 mg/dL (ref 1.7–2.4)

## 2018-04-18 LAB — BRAIN NATRIURETIC PEPTIDE: B NATRIURETIC PEPTIDE 5: 100.9 pg/mL — AB (ref 0.0–100.0)

## 2018-04-18 MED ORDER — ADULT MULTIVITAMIN W/MINERALS CH
1.0000 | ORAL_TABLET | Freq: Every day | ORAL | Status: DC
  Administered 2018-04-19 – 2018-04-21 (×3): 1 via ORAL
  Filled 2018-04-18 (×3): qty 1

## 2018-04-18 MED ORDER — ACETAMINOPHEN 325 MG PO TABS
325.0000 mg | ORAL_TABLET | Freq: Four times a day (QID) | ORAL | Status: DC | PRN
  Administered 2018-04-18 – 2018-04-19 (×3): 325 mg via ORAL
  Filled 2018-04-18 (×3): qty 1

## 2018-04-18 MED ORDER — ATORVASTATIN CALCIUM 40 MG PO TABS
40.0000 mg | ORAL_TABLET | Freq: Every day | ORAL | Status: DC
  Administered 2018-04-19 – 2018-04-20 (×2): 40 mg via ORAL
  Filled 2018-04-18 (×4): qty 1

## 2018-04-18 MED ORDER — SENNOSIDES-DOCUSATE SODIUM 8.6-50 MG PO TABS: 1.0000 | ORAL_TABLET | Freq: Every evening | ORAL | Status: DC | PRN

## 2018-04-18 MED ORDER — OXYCODONE HCL 5 MG PO TABS
5.0000 mg | ORAL_TABLET | Freq: Once | ORAL | Status: AC
  Administered 2018-04-18: 5 mg via ORAL
  Filled 2018-04-18: qty 1

## 2018-04-18 MED ORDER — STROKE: EARLY STAGES OF RECOVERY BOOK
Freq: Once | Status: AC
  Administered 2018-04-18: 20:00:00
  Filled 2018-04-18 (×2): qty 1

## 2018-04-18 MED ORDER — PIRFENIDONE 267 MG PO CAPS
801.0000 mg | ORAL_CAPSULE | Freq: Three times a day (TID) | ORAL | Status: DC
  Administered 2018-04-19 – 2018-04-21 (×7): 801 mg via ORAL
  Filled 2018-04-18 (×7): qty 3

## 2018-04-18 MED ORDER — ASPIRIN 325 MG PO TABS
325.0000 mg | ORAL_TABLET | Freq: Every day | ORAL | Status: DC
  Administered 2018-04-18 – 2018-04-19 (×2): 325 mg via ORAL
  Filled 2018-04-18 (×2): qty 1

## 2018-04-18 MED ORDER — ENOXAPARIN SODIUM 40 MG/0.4ML ~~LOC~~ SOLN
40.0000 mg | SUBCUTANEOUS | Status: DC
  Administered 2018-04-18 – 2018-04-20 (×3): 40 mg via SUBCUTANEOUS
  Filled 2018-04-18 (×4): qty 0.4

## 2018-04-18 MED ORDER — LORATADINE 10 MG PO TABS
10.0000 mg | ORAL_TABLET | Freq: Every day | ORAL | Status: DC
  Administered 2018-04-19 – 2018-04-21 (×3): 10 mg via ORAL
  Filled 2018-04-18 (×4): qty 1

## 2018-04-18 MED ORDER — PANTOPRAZOLE SODIUM 40 MG PO TBEC
40.0000 mg | DELAYED_RELEASE_TABLET | Freq: Every day | ORAL | Status: DC
  Administered 2018-04-18 – 2018-04-21 (×4): 40 mg via ORAL
  Filled 2018-04-18 (×4): qty 1

## 2018-04-18 MED ORDER — CALCIUM CARBONATE-VITAMIN D 500-200 MG-UNIT PO TABS
1.0000 | ORAL_TABLET | Freq: Every day | ORAL | Status: DC
  Administered 2018-04-18 – 2018-04-21 (×4): 1 via ORAL
  Filled 2018-04-18 (×4): qty 1

## 2018-04-18 MED ORDER — IPRATROPIUM-ALBUTEROL 0.5-2.5 (3) MG/3ML IN SOLN: 3.0000 mL | Freq: Four times a day (QID) | RESPIRATORY_TRACT | Status: DC | PRN

## 2018-04-18 MED ORDER — LEVOTHYROXINE SODIUM 88 MCG PO TABS
88.0000 ug | ORAL_TABLET | Freq: Every day | ORAL | Status: DC
  Administered 2018-04-19 – 2018-04-21 (×3): 88 ug via ORAL
  Filled 2018-04-18 (×3): qty 1

## 2018-04-18 MED ORDER — ACETAMINOPHEN 325 MG PO TABS
650.0000 mg | ORAL_TABLET | Freq: Once | ORAL | Status: AC
  Administered 2018-04-18: 650 mg via ORAL
  Filled 2018-04-18: qty 2

## 2018-04-18 MED ORDER — OXYCODONE HCL 5 MG PO TABS: 5.0000 mg | ORAL_TABLET | Freq: Four times a day (QID) | ORAL | Status: DC | PRN

## 2018-04-18 MED ORDER — CRANBERRY 1000 MG PO CAPS: 1000.0000 mg | ORAL_CAPSULE | Freq: Every day | ORAL | Status: DC

## 2018-04-18 MED ORDER — IOPAMIDOL (ISOVUE-370) INJECTION 76%
INTRAVENOUS | Status: AC
  Filled 2018-04-18: qty 50

## 2018-04-18 MED ORDER — IOPAMIDOL (ISOVUE-370) INJECTION 76%
50.0000 mL | Freq: Once | INTRAVENOUS | Status: AC | PRN
  Administered 2018-04-18: 50 mL via INTRAVENOUS

## 2018-04-18 MED ORDER — SODIUM CHLORIDE 0.9 % IV SOLN
INTRAVENOUS | Status: DC
  Administered 2018-04-18 – 2018-04-19 (×3): via INTRAVENOUS

## 2018-04-18 NOTE — Progress Notes (Signed)
Tina Calderon is a 77 yo female coming from home via [facility] with acute onset of BLE weakness and unable to walk. Hx of HTN, hypothyroidism, CHF and Cancer. NIH 5, LKW 0700, CBG 107.

## 2018-04-18 NOTE — ED Notes (Signed)
Pt provided w/ food and beverage per [place], [place]

## 2018-04-18 NOTE — ED Notes (Signed)
Patient transported to MRI 

## 2018-04-18 NOTE — ED Notes (Addendum)
Pt from home via ems; pt c/o sudden onset of leg weakness after getting out of shower; LSW 0700; per ems, pt unable to walk or move legs, intermittent slurred speech w/ ems; stuttering observed on arrival; no facial droop noted; grip strength equal; pt evaluated for similar symptoms in August; pt c/o HA, hypertensive w/ ems at 194/120

## 2018-04-18 NOTE — ED Notes (Signed)
Patient transported to X-ray 

## 2018-04-18 NOTE — ED Notes (Signed)
Pt returned from MRI °

## 2018-04-18 NOTE — H&P (Signed)
History and Physical    Tina Calderon:[id number] DOB: [birth year] DOA: 04/18/2018  PCP: Tina Koch, NP  Patient coming from: Home   Chief Complaint: Stuttering, weakness.  HPI: Tina Calderon is a 77 y.o. female with medical history significant of Hypothyroidism, asthma, ILD, HTN, GERD, chronic fatigue, chronic CHF, h/o cancer who presents for acute weakness and stuttering. She reports that she was in her normal state of health until about 8am this morning when she developed acute leg weakness and stuttering.  She had talked to her sister just before this and was completely normal.  She has now has leg weakness, but has intermittent stuttering.  During our interview, she will have long courses of fluent speech, intermixed with stuttering. She could name pen, but stuttered to say stethoscope.  She has had 2 similar episodes of sudden leg weakness in the past year, the first in August and then again in October.  She was placed in rehab and did better very quickly.  They did not find a stroke at that time.  She has had a headache this time and is very tearful on exam from being anxious about her prognosis.  She otherwise is doing well, has no chest pain, fever, chills, recent illness, nausea, vomiting.  She has a history of malaise and lightheadedness.   ED Course: In the ED, she was evaluated by Neurology.  Initial CT head showed no abnormality.  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  CTA head and neck showed no issues in the aorta or carotid arteries.  Her blood work is relatively normal with some mild normocytic anemia which seems chronic for her.  I spoke with Dr. ARory Percyin the ED, and she should have stroke work up re-done given new findings.  Reviewed Dr. AJohny Chessnote as well.   Review of Systems: As per HPI otherwise 10 point review of systems negative.   Past Medical History:  Diagnosis Date  . Anginal pain ([facility]   . Cancer ([facility]   . CHF (congestive heart failure)  ([facility] 01/22/2012  . Chronic fatigue 07/15/2014  . CVA (cerebral vascular accident) ([facility] 04/18/2018  . GERD (gastroesophageal reflux disease) 05/12/2017  . Hypertension   . Hypothyroidism   . ILD (interstitial lung disease) ([facility] 02/04/2012  . IPF (idiopathic pulmonary fibrosis) ([facility] 2013  . Mild intermittent asthma 01/29/2016  . Pelvic relaxation   . Prolapse of bladder   . Shortness of breath   . Thyroid disease     Past Surgical History:  Procedure Laterality Date  . ANTERIOR AND POSTERIOR REPAIR  12/05/2011   Procedure: ANTERIOR (CYSTOCELE) AND POSTERIOR REPAIR (RECTOCELE);  Surgeon: Tina Calderon;  [facility];  Service: Gynecology;  Laterality: N/A;  with cysto  . Bladder tact  15 years ago  . LEFT HEART CATHETERIZATION WITH CORONARY ANGIOGRAM N/A 01/22/2012   Procedure: LEFT HEART CATHETERIZATION WITH CORONARY ANGIOGRAM;  Surgeon: Tina Calderon;  [facility];  Service: Cardiovascular;  Laterality: N/A;  . NECK SURGERY  2010  . SHOULDER SURGERY    . thyroid disease    . TONSILLECTOMY    . VAGINAL HYSTERECTOMY  15 years ago   Reviewed with patient.   reports that she has never smoked. She has never used smokeless tobacco. She reports that she does not drink alcohol or use drugs.  Allergies  Allergen Reactions  . Dilaudid [Hydromorphone] Nausea And Vomiting  . Crestor [Rosuvastatin Calcium] Other (See  Comments)    Myalgias, muscle weakness   Reviewed with patient.  Family History  Problem Relation Age of Onset  . Thyroid disease Mother   . Hypertension Mother   . Coronary artery disease Mother   . Dementia Mother   . Aneurysm Father   . Pulmonary fibrosis Sister   . Stroke Other   . Sleep apnea Other   . Cancer Brother        LIVER  . Pulmonary fibrosis Brother     Prior to Admission medications   Medication Sig Start Date End Date Taking? Authorizing Provider  acetaminophen (TYLENOL) 325 MG tablet Take 325 mg by mouth every 6 (six) hours  as needed for headache.   Yes Provider, Historical, Calderon  amLODipine (NORVASC) 5 MG tablet Take 1 tablet (5 mg total) by mouth daily. For blood pressure. 04/17/18  Yes Tina Koch, NP  aspirin EC 81 MG tablet Take 81 mg by mouth daily.   Yes Provider, Historical, Calderon  calcium-vitamin D (OSCAL WITH D) 500-200 MG-UNIT per tablet Take 1 tablet by mouth daily.    Yes Provider, Historical, Calderon  Cranberry 1000 MG CAPS Take 1,000 mg by mouth daily.    Yes Provider, Historical, Calderon  ESBRIET 267 MG CAPS TAKE 3 CAPSULES BY MOUTH THREE TIMES DAILY WITH FOOD Patient taking differently: Take 801 mg by mouth 3 (three) times daily.  07/15/17  Yes Tina Gobble, Calderon  levothyroxine (SYNTHROID, LEVOTHROID) 88 MCG tablet TAKE 1 TABLET BY MOUTH EVERY DAY Patient taking differently: Take 88 mcg by mouth daily before breakfast.  01/05/18  Yes Tina Koch, NP  loratadine (CLARITIN) 10 MG tablet Take 10 mg by mouth daily.    Yes Provider, Historical, Calderon  metoprolol succinate (TOPROL-XL) 25 MG 24 hr tablet TAKE 1/2 TABLET BY MOUTH EVERY DAY Patient taking differently: Take 12.5 mg by mouth daily.  04/14/18  Yes Tina Koch, NP  Multiple Vitamin (MULTIVITAMIN WITH MINERALS) TABS Take 1 tablet by mouth daily before breakfast. **Centrum silver**   Yes Provider, Historical, Calderon  omeprazole (PRILOSEC) 20 MG capsule TAKE 1 CAPSULE (20 MG TOTAL) BY MOUTH DAILY. Patient taking differently: Take 20 mg by mouth daily before breakfast.  12/08/17  Yes Byrum, Tina Fillers, Calderon    Physical Exam:  Constitutional: NAD, calm, comfortable, tearful at times.  Vitals:   04/18/18 1530 04/18/18 1545 04/18/18 1600 04/18/18 1633  BP: (!) 162/103 (!) 168/99 131/90 (!) 149/82  Pulse: 82 99 84 92  Resp: (!) 22 (!) _0 Temp:      TempSrc:      SpO2: 96% 94% 95% 95%   Eyes: PERRL, EOMI, lids and conjunctivae normal ENMT: Mucous membranes are moist. Normal dentition Neck: normal, supple Respiratory: Breathing comfortably, no  wheezing or upper airway sounds Cardiovascular: RR, NR, no murmur noted Abdomen: +BS, NT, ND Musculoskeletal: No clubbing or cyanosis, no discoloration, legs are warm Skin: no rashes, lesions, ulcers on exposed skin Neurologic: CN 2-12 grossly intact. Sensation intact, She had slowed finger to nose on the right, but no dysmetria.  She had intermittent stuttering, seemed to worsen when she was crying.  Lower extremities with normal quadricep strength, but difficulty raising legs when out straight.  Dorsi and plantar flexion normal.  Sensation intact to light touch.  Heel to shin normal bilaterally.  Gait testing deferred.  Psychiatric: Normal judgment and insight. Anxious affect.    Labs on Admission: I have personally reviewed following labs and  imaging studies  CBC: Recent Labs  Lab 04/18/18 0931 04/18/18 0937  WBC 6.8  --   NEUTROABS 5.2  --   HGB 10.9* 12.6  HCT 36.2 37.0  MCV 81.5  --   PLT 205  --    Basic Metabolic Panel: Recent Labs  Lab 04/18/18 0931 04/18/18 0937  NA 136 137  K 4.3 4.3  CL 103 100  CO2 26  --   GLUCOSE 109* 107*  BUN 11 11  CREATININE 0.98 0.80  CALCIUM 8.8*  --   MG 1.8  --   PHOS 3.0  --    GFR: Estimated Creatinine Clearance: 45.8 mL/min (by C-G formula based on SCr of 0.8 mg/dL). Liver Function Tests: Recent Labs  Lab 04/18/18 0931  AST 23  ALT 12  ALKPHOS 52  BILITOT 0.5  PROT 6.4*  ALBUMIN 3.8   No results for input(s): LIPASE, AMYLASE in the last 168 hours. No results for input(s): AMMONIA in the last 168 hours. Coagulation Profile: Recent Labs  Lab 04/18/18 0931  INR 1.03   Cardiac Enzymes: No results for input(s): CKTOTAL, CKMB, CKMBINDEX, TROPONINI in the last 168 hours. BNP (last 3 results) No results for input(s): PROBNP in the last 8760 hours. HbA1C: No results for input(s): HGBA1C in the last 72 hours. CBG: Recent Labs  Lab 04/18/18 0930  GLUCAP 97   Lipid Profile: No results for input(s): CHOL, HDL,  LDLCALC, TRIG, CHOLHDL, LDLDIRECT in the last 72 hours. Thyroid Function Tests: No results for input(s): TSH, T4TOTAL, FREET4, T3FREE, THYROIDAB in the last 72 hours. Anemia Panel: No results for input(s): VITAMINB12, FOLATE, FERRITIN, TIBC, IRON, RETICCTPCT in the last 72 hours. Urine analysis:    Component Value Date/Time   COLORURINE STRAW (A) 04/18/2018 1022   APPEARANCEUR CLEAR 04/18/2018 1022   APPEARANCEUR Hazy 12/18/2013 1504   LABSPEC 1.015 04/18/2018 1022   LABSPEC 1.015 12/18/2013 1504   PHURINE 8.0 04/18/2018 1022   GLUCOSEU NEGATIVE 04/18/2018 1022   GLUCOSEU Negative 12/18/2013 1504   HGBUR NEGATIVE 04/18/2018 1022   BILIRUBINUR NEGATIVE 04/18/2018 1022   BILIRUBINUR Negative 12/18/2013 1504   KETONESUR NEGATIVE 04/18/2018 1022   PROTEINUR NEGATIVE 04/18/2018 1022   UROBILINOGEN negative 11/13/2011 0907   UROBILINOGEN 0.2 09/19/2009 0518   NITRITE NEGATIVE 04/18/2018 1022   LEUKOCYTESUR NEGATIVE 04/18/2018 1022   LEUKOCYTESUR 2+ 12/18/2013 1504    Radiological Exams on Admission: Ct Angio Head W Or Wo Contrast  Result Date: 04/18/2018 CLINICAL DATA:  Code stroke.  Slurred speech.  Gait disturbance. EXAM: CT ANGIOGRAPHY HEAD AND NECK TECHNIQUE: Multidetector CT imaging of the head and neck was performed using the standard protocol during bolus administration of intravenous contrast. Multiplanar CT image reconstructions and MIPs were obtained to evaluate the vascular anatomy. Carotid stenosis measurements (when applicable) are obtained utilizing NASCET criteria, using the distal internal carotid diameter as the denominator. CONTRAST:  38m ISOVUE-370 IOPAMIDOL (ISOVUE-370) INJECTION 76% COMPARISON:  Head CT earlier same day.  MRI 02/02/2018. FINDINGS: CTA NECK FINDINGS Aortic arch: Aortic atherosclerosis. No aneurysm or dissection. Branching pattern is normal without brachiocephalic vessel origin stenosis. Right carotid system: Common carotid artery widely patent to the  bifurcation. Mild atherosclerotic plaque at the carotid bifurcation and ICA bulb but no stenosis. Left carotid system: Common carotid artery widely patent to the bifurcation. Atherosclerotic plaque at the distal common carotid artery and carotid bifurcation region. Calcified and soft plaque at the bifurcation and ICA bulb with minimal proximal ICA diameter of 4.2 mm. Compared  to a more distal cervical ICA diameter of 5 mm, this indicates a 20% stenosis. Vertebral arteries: Left vertebral artery is dominant. Both vertebral artery origins are widely patent. Both vertebral arteries are widely patent through the cervical region to the foramen magnum. Skeleton: Previous ACDF C5-C7. Ordinary degenerative changes above and below that. Other neck: No mass or lymphadenopathy. Upper chest: Scarring and emphysema at the lung apices. Review of the MIP images confirms the above findings CTA HEAD FINDINGS Anterior circulation: Both internal carotid arteries are widely patent through the skull base and siphon regions. The anterior and middle cerebral vessels are patent without proximal stenosis, aneurysm or vascular malformation. No large or medium vessel occlusion identified. Posterior circulation: Both vertebral arteries are patent to the basilar. No basilar stenosis. Posterior circulation branch vessels are normal. Venous sinuses: Patent and normal. Anatomic variants: None significant. Delayed phase: No abnormal enhancement. Review of the MIP images confirms the above findings IMPRESSION: No acute finding on this CT angiogram. 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 No flow limiting carotid bifurcation stenosis. No significant intracranial finding. Electronically Signed   By: Nelson Chimes M.D.   On: 04/18/2018 10:11   Dg Chest 2 View  Result Date: 04/18/2018 CLINICAL DATA:  Patient with weakness.  Shortness of breath. EXAM: CHEST - 2 VIEW COMPARISON:  Chest radiograph 11/11/2016  FINDINGS: Monitoring leads overlie the patient. Stable cardiomegaly. Large hiatal hernia diffuse bilateral interstitial pulmonary opacities, increased from prior. No definite pleural effusion or pneumothorax. Thoracic spine degenerative changes. IMPRESSION: Cardiomegaly. Increased interstitial opacities favored to represent edema or atypical infection superimposed upon chronic lung disease. Electronically Signed   By: Lovey Newcomer M.D.   On: 04/18/2018 10:58   Ct Angio Neck W Or Wo Contrast  Result Date: 04/18/2018 CLINICAL DATA:  Code stroke.  Slurred speech.  Gait disturbance. EXAM: CT ANGIOGRAPHY HEAD AND NECK TECHNIQUE: Multidetector CT imaging of the head and neck was performed using the standard protocol during bolus administration of intravenous contrast. Multiplanar CT image reconstructions and MIPs were obtained to evaluate the vascular anatomy. Carotid stenosis measurements (when applicable) are obtained utilizing NASCET criteria, using the distal internal carotid diameter as the denominator. CONTRAST:  93m ISOVUE-370 IOPAMIDOL (ISOVUE-370) INJECTION 76% COMPARISON:  Head CT earlier same day.  MRI 02/02/2018. FINDINGS: CTA NECK FINDINGS Aortic arch: Aortic atherosclerosis. No aneurysm or dissection. Branching pattern is normal without brachiocephalic vessel origin stenosis. Right carotid system: Common carotid artery widely patent to the bifurcation. Mild atherosclerotic plaque at the carotid bifurcation and ICA bulb but no stenosis. Left carotid system: Common carotid artery widely patent to the bifurcation. Atherosclerotic plaque at the distal common carotid artery and carotid bifurcation region. Calcified and soft plaque at the bifurcation and ICA bulb with minimal proximal ICA diameter of 4.2 mm. Compared to a more distal cervical ICA diameter of 5 mm, this indicates a 20% stenosis. Vertebral arteries: Left vertebral artery is dominant. Both vertebral artery origins are widely patent. Both  vertebral arteries are widely patent through the cervical region to the foramen magnum. Skeleton: Previous ACDF C5-C7. Ordinary degenerative changes above and below that. Other neck: No mass or lymphadenopathy. Upper chest: Scarring and emphysema at the lung apices. Review of the MIP images confirms the above findings CTA HEAD FINDINGS Anterior circulation: Both internal carotid arteries are widely patent through the skull base and siphon regions. The anterior and middle cerebral vessels are patent without proximal stenosis, aneurysm or vascular malformation. No large or medium  vessel occlusion identified. Posterior circulation: Both vertebral arteries are patent to the basilar. No basilar stenosis. Posterior circulation branch vessels are normal. Venous sinuses: Patent and normal. Anatomic variants: None significant. Delayed phase: No abnormal enhancement. Review of the MIP images confirms the above findings IMPRESSION: No acute finding on this CT angiogram. 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 No flow limiting carotid bifurcation stenosis. No significant intracranial finding. Electronically Signed   By: Nelson Chimes M.D.   On: 04/18/2018 10:11   Mr Brain Wo Contrast  Result Date: 04/18/2018 CLINICAL DATA:  Code stroke.  Slurred speech. EXAM: MRI HEAD WITHOUT CONTRAST TECHNIQUE: Multiplanar, multiecho pulse sequences of the brain and surrounding structures were obtained without intravenous contrast. COMPARISON:  CT head and CTA head earlier today. FINDINGS: Brain: Subcentimeter focus of restricted diffusion, LEFT centrum semiovale, corresponding low ADC, consistent with acute infarction. Even smaller punctate foci of apparent restriction related to a LEFT anterior superior frontal gyrus, probably real, but difficult to confirm on coronal sequences. No hemorrhage, mass lesion, hydrocephalus, or extra-axial fluid. Mild to moderate atrophy. T2 and FLAIR hyperintensities in  the white matter, both focal and confluent, moderately severe, likely small vessel disease. Small foci of hemosiderin deposition are seen throughout the brain, most notably RIGHT thalamus, likely hypertensive related. Vascular: Flow voids are maintained. Skull and upper cervical spine: No worrisome osseous lesion. Sinuses/Orbits: Clear sinuses. Other: None. 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 See discussion above. Atrophy with chronic microvascular ischemic change, along with areas of chronic hemorrhage, suggesting hypertensive related cerebrovascular disease. Electronically Signed   By: Staci Righter M.D.   On: 04/18/2018 14:03   Ct Head Code Stroke Wo Contrast  Result Date: 04/18/2018 CLINICAL DATA:  Code stroke. Slurred speech. Gait disturbance. Last seen normal 0700 hours EXAM: CT HEAD WITHOUT CONTRAST TECHNIQUE: Contiguous axial images were obtained from the base of the skull through the vertex without intravenous contrast. COMPARISON:  01/30/2018. FINDINGS: Brain: Age related atrophy. Chronic small-vessel ischemic changes of the white matter. No sign of acute infarction, mass lesion, hemorrhage, hydrocephalus or extra-axial collection. Vascular: There is atherosclerotic calcification of the major vessels at the base of the brain. Skull: Negative Sinuses/Orbits: Clear/normal Other: None ASPECTS ([place] Stroke Program Early CT Score) - Ganglionic level infarction (caudate, lentiform nuclei, internal capsule, insula, M1-M3 cortex): 7 - Supraganglionic infarction (M4-M6 cortex): 3 Total score (0-10 with 10 being normal): 10 IMPRESSION: 1. No acute finding. Atrophy and chronic small-vessel ischemic change. 2. ASPECTS is 10. 3. These results were communicated to Dr. Rory Percy at 9:46 amon 11/9/2019by text page via the [facility] messaging system. Electronically Signed   By: Nelson Chimes M.D.   On: 04/18/2018 09:47    EKG: Independently reviewed. Wandering baseline,  pwaves noted, NSR  Assessment/Plan CVA (cerebral vascular accident) with  Lower extremity weakness - CTA head and neck showed no acute finding, MRI showed new acute stroke - Neuro following, reviewed Dr. Rory Percy note - neuro checks - Telemetry - TTE - A1C and LDL - Full dose aspirin - Allow permissive HTN - PT/OT/speech - Swallow screen in the ED was okay - Symptoms not completely explained by finding on MRI, may need further neurological work up.    Essential hypertension - Holding amlodipine and metoprolol to allow for permissive HTN   Hypothyroidism - Continue home synthroid    Normocytic anemia - Baseline in past few months appears to be around 11 - If trends  down, consider checking an iron and ferritin    Hyperlipidemia - LDL at last check in July showed LDL of 149 - Repeat lipid panel pending - Start high intensity statin, she had muscle weakness with crestor, will start atorvastatin 40    IPF (idiopathic pulmonary fibrosis) - Continue home Esbriet - Inhalers as needed, her breathing was stable in the ed   GERD (gastroesophageal reflux disease) - Continue omeprazole (formulary alternative protonix started)   Chronic diastolic congestive heart failure - Grade 1 diastolic on last TTE, pending repeat TTE for stroke work up.      DVT prophylaxis: Enoxaparin Code Status: Full Family Communication: Husband Clair Gulling and sister at bedside Disposition Plan: Admit to obs for stroke work up [employer] called: Neurology, Rory Percy Admission status: Obs, telemetry    Gilles Chiquito Calderon [facility] Pager [phone]  If 7PM-7AM, please contact night-coverage [email or web address] Password [facility]  04/18/2018, 4:42 PM

## 2018-04-18 NOTE — Consult Note (Addendum)
NEURO HOSPITALIST  CONSULT    Requesting Physician: Dr. Winfred Leeds    Chief Complaint: slurred speech/difficulty walking  History obtained from:  Patient    HPI:                                                                                                                                         Tina Calderon is an 77 y.o. female  With PMH of HTN,hypothyroidism, CHF and cancer who presented top [facility] ed as a code stroke c/o slurred speech and leg weakness.   Patient woke up this morning in her usual state of health. She was taking a shower about  And had a sudden onset of bilateral leg weakness and she could not walk. She also complained of slurred speech. Endorses having a light headache, which is unsual for her. Of note patient was seen yesterday at [facility] primary care for a f/u for c/o of bilateral lower extremity weakness on 03/15/18 and 1 month prior to that c/o  of the same.  No prior stroke history. Denies any CP, SOB. Chart review reveals at least two prior presentations, since August 2019, for sudden onset bilateral leg weakness but no speech problems at those times.  ED course:  CT head: no hemorrhage CTA: no LVO BP: 188/108 BG: 148 58.8 Kg  Date last known well: Date: 04/18/2018 Time last known well: Time: 07:00 tPA Given: No: too mild to treat; symptoms inconsistent Modified Rankin: Rankin Score=1 NIHSS:5    Past Medical History:  Diagnosis Date  . Anginal pain ([place])   . Cancer ([place])   . CHF (congestive heart failure) ([place]) 01/22/2012  . Chronic fatigue 07/15/2014  . GERD (gastroesophageal reflux disease) 05/12/2017  . Hypertension   . Hypothyroidism   . ILD (interstitial lung disease) ([place]) 02/04/2012  . IPF (idiopathic pulmonary fibrosis) ([place]) 2013  . Mild intermittent asthma 01/29/2016  . Pelvic relaxation   . Prolapse of bladder   . Shortness of breath   . Thyroid disease     Past Surgical History:  Procedure  Laterality Date  . ANTERIOR AND POSTERIOR REPAIR  12/05/2011   Procedure: ANTERIOR (CYSTOCELE) AND POSTERIOR REPAIR (RECTOCELE);  Surgeon: [name], MD;  Location: [facility];  Service: Gynecology;  Laterality: N/A;  with cysto  . Bladder tact  15 years ago  . LEFT HEART CATHETERIZATION WITH CORONARY ANGIOGRAM N/A 01/22/2012   Procedure: LEFT HEART CATHETERIZATION WITH CORONARY ANGIOGRAM;  Surgeon: [name], MD;  Location: [facility] CATH LAB;  Service: Cardiovascular;  Laterality: N/A;  . NECK SURGERY  2010  . SHOULDER SURGERY    . thyroid disease    .  TONSILLECTOMY    . VAGINAL HYSTERECTOMY  15 years ago    Family History  Problem Relation Age of Onset  . Thyroid disease Mother   . Hypertension Mother   . Coronary artery disease Mother   . Dementia Mother   . Aneurysm Father   . Pulmonary fibrosis Sister   . Stroke Other   . Sleep apnea Other   . Cancer Brother        LIVER  . Pulmonary fibrosis Brother          Social History:  reports that she has never smoked. She has never used smokeless tobacco. She reports that she does not drink alcohol or use drugs.  Allergies:  Allergies  Allergen Reactions  . Dilaudid [Hydromorphone] Nausea And Vomiting  . Crestor [Rosuvastatin Calcium] Other (See Comments)    Myalgias, muscle weakness    Medications:                                                                                                                           No current facility-administered medications for this encounter.    Current Outpatient Medications  Medication Sig Dispense Refill  . acetaminophen (TYLENOL) 325 MG tablet Take 325 mg by mouth every 6 (six) hours as needed for headache.    [name] amLODipine (NORVASC) 5 MG tablet Take 1 tablet (5 mg total) by mouth daily. For blood pressure. 30 tablet 0  . aspirin EC 81 MG tablet Take 81 mg by mouth daily.    . calcium-vitamin D (OSCAL WITH D) 500-200 MG-UNIT per tablet Take 1 tablet by mouth daily.     .  Cranberry 1000 MG CAPS Take 1,000 mg by mouth daily.     . ESBRIET 267 MG CAPS TAKE 3 CAPSULES BY MOUTH THREE TIMES DAILY WITH FOOD (Patient taking differently: Take 801 mg by mouth 3 (three) times daily. ) 270 capsule 3  . levothyroxine (SYNTHROID, LEVOTHROID) 88 MCG tablet TAKE 1 TABLET BY MOUTH EVERY DAY (Patient taking differently: Take 88 mcg by mouth daily before breakfast. ) 90 tablet 1  . loratadine (CLARITIN) 10 MG tablet Take 10 mg by mouth daily.     . metoprolol succinate (TOPROL-XL) 25 MG 24 hr tablet TAKE 1/2 TABLET BY MOUTH EVERY DAY 45 tablet 1  . Multiple Vitamin (MULTIVITAMIN WITH MINERALS) TABS Take 1 tablet by mouth daily before breakfast. **Centrum silver**    . omeprazole (PRILOSEC) 20 MG capsule TAKE 1 CAPSULE (20 MG TOTAL) BY MOUTH DAILY. (Patient taking differently: Take 20 mg by mouth daily before breakfast. ) 90 capsule 1     ROS:  ROS was performed and is negative except as noted in HPI  General Examination:                                                                                                      Blood pressure (!) 178/82, pulse 77, temperature 99.2 F (37.3 C), temperature source Oral, resp. rate 18, SpO2 97 %. General: Well-developed well-nourished in no distress HEENT: Normocephalic atraumatic moist oral mucous membranes Cardiovascular: S1-S2 heard regular rate rhythm, hypertensive Respiratory: Chest clear to auscultation Extremities: No edema Neurological exam Patient is awake alert oriented x3 Her speech is stuttering Naming comprehension and repetition is intact in the She has an anxious appearing affect Cranial nerves: Pupils equal round reactive to light, extra ocular movements intact, visual fields full, face symmetric, facial sensation intact bilaterally, auditory acuity mildly reduced bilaterally, palate  elevates symmetrically, shoulder shrug intact, tongue midline. Motor exam: Upper extremities are 4+/5 similar strength bilaterally with no vertical drift.  Lower extremities exam is slightly inconsistent with effort dependent weakness of the right leg with positive Hoover sign. Right lower extremity 3/5 at the hip, 4/5 at the knee and ankle. Left lower extremity is 4/5 at the hip, 4/5 at the knee and the ankle. Sensory: Decreased to the right Delora and upper extremity.  Preserved on the face. Coordination: Normal finger-nose-finger.  Unable to perform heel-knee-shin bilaterally Gait testing was deferred at this time DTRs: Brisk bilaterally in lower extremities.  Plantars downgoing bilaterally.    Lab Results: Basic Metabolic Panel: Recent Labs  Lab 04/18/18 0937  NA 137  K 4.3  CL 100  GLUCOSE 107*  BUN 11  CREATININE 0.80    CBC: Recent Labs  Lab 04/18/18 0937  HGB 12.6  HCT 37.0     CBG: Recent Labs  Lab 04/18/18 0930  GLUCAP 97    Imaging: Ct Angio Head W Or Wo Contrast  Result Date: 04/18/2018 CLINICAL DATA:  Code stroke.  Slurred speech.  Gait disturbance. EXAM: CT ANGIOGRAPHY HEAD AND NECK TECHNIQUE: Multidetector CT imaging of the head and neck was performed using the standard protocol during bolus administration of intravenous contrast. Multiplanar CT image reconstructions and MIPs were obtained to evaluate the vascular anatomy. Carotid stenosis measurements (when applicable) are obtained utilizing NASCET criteria, using the distal internal carotid diameter as the denominator. CONTRAST:  36m ISOVUE-370 IOPAMIDOL (ISOVUE-370) INJECTION 76% COMPARISON:  Head CT earlier same day.  MRI 02/02/2018. FINDINGS: CTA NECK FINDINGS Aortic arch: Aortic atherosclerosis. No aneurysm or dissection. Branching pattern is normal without brachiocephalic vessel origin stenosis. Right carotid system: Common carotid artery widely patent to the bifurcation. Mild atherosclerotic plaque at  the carotid bifurcation and ICA bulb but no stenosis. Left carotid system: Common carotid artery widely patent to the bifurcation. Atherosclerotic plaque at the distal common carotid artery and carotid bifurcation region. Calcified and soft plaque at the bifurcation and ICA bulb with minimal proximal ICA diameter of 4.2 mm. Compared to a more distal cervical ICA diameter of 5 mm, this indicates a 20% stenosis. Vertebral arteries: Left vertebral artery is dominant. Both  vertebral artery origins are widely patent. Both vertebral arteries are widely patent through the cervical region to the foramen magnum. Skeleton: Previous ACDF C5-C7. Ordinary degenerative changes above and below that. Other neck: No mass or lymphadenopathy. Upper chest: Scarring and emphysema at the lung apices. Review of the MIP images confirms the above findings CTA HEAD FINDINGS Anterior circulation: Both internal carotid arteries are widely patent through the skull base and siphon regions. The anterior and middle cerebral vessels are patent without proximal stenosis, aneurysm or vascular malformation. No large or medium vessel occlusion identified. Posterior circulation: Both vertebral arteries are patent to the basilar. No basilar stenosis. Posterior circulation branch vessels are normal. Venous sinuses: Patent and normal. Anatomic variants: None significant. Delayed phase: No abnormal enhancement. Review of the MIP images confirms the above findings IMPRESSION: No acute finding on this CT angiogram. 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 No flow limiting carotid bifurcation stenosis. No significant intracranial finding. Electronically Signed   By: Nelson Chimes M.D.   On: 04/18/2018 10:11   Dg Chest 2 View  Result Date: 04/18/2018 CLINICAL DATA:  Patient with weakness.  Shortness of breath. EXAM: CHEST - 2 VIEW COMPARISON:  Chest radiograph 11/11/2016 FINDINGS: Monitoring leads overlie the patient.  Stable cardiomegaly. Large hiatal hernia diffuse bilateral interstitial pulmonary opacities, increased from prior. No definite pleural effusion or pneumothorax. Thoracic spine degenerative changes. IMPRESSION: Cardiomegaly. Increased interstitial opacities favored to represent edema or atypical infection superimposed upon chronic lung disease. Electronically Signed   By: Lovey Newcomer M.D.   On: 04/18/2018 10:58   Ct Angio Neck W Or Wo Contrast  Result Date: 04/18/2018 CLINICAL DATA:  Code stroke.  Slurred speech.  Gait disturbance. EXAM: CT ANGIOGRAPHY HEAD AND NECK TECHNIQUE: Multidetector CT imaging of the head and neck was performed using the standard protocol during bolus administration of intravenous contrast. Multiplanar CT image reconstructions and MIPs were obtained to evaluate the vascular anatomy. Carotid stenosis measurements (when applicable) are obtained utilizing NASCET criteria, using the distal internal carotid diameter as the denominator. CONTRAST:  26m ISOVUE-370 IOPAMIDOL (ISOVUE-370) INJECTION 76% COMPARISON:  Head CT earlier same day.  MRI 02/02/2018. FINDINGS: CTA NECK FINDINGS Aortic arch: Aortic atherosclerosis. No aneurysm or dissection. Branching pattern is normal without brachiocephalic vessel origin stenosis. Right carotid system: Common carotid artery widely patent to the bifurcation. Mild atherosclerotic plaque at the carotid bifurcation and ICA bulb but no stenosis. Left carotid system: Common carotid artery widely patent to the bifurcation. Atherosclerotic plaque at the distal common carotid artery and carotid bifurcation region. Calcified and soft plaque at the bifurcation and ICA bulb with minimal proximal ICA diameter of 4.2 mm. Compared to a more distal cervical ICA diameter of 5 mm, this indicates a 20% stenosis. Vertebral arteries: Left vertebral artery is dominant. Both vertebral artery origins are widely patent. Both vertebral arteries are widely patent through the  cervical region to the foramen magnum. Skeleton: Previous ACDF C5-C7. Ordinary degenerative changes above and below that. Other neck: No mass or lymphadenopathy. Upper chest: Scarring and emphysema at the lung apices. Review of the MIP images confirms the above findings CTA HEAD FINDINGS Anterior circulation: Both internal carotid arteries are widely patent through the skull base and siphon regions. The anterior and middle cerebral vessels are patent without proximal stenosis, aneurysm or vascular malformation. No large or medium vessel occlusion identified. Posterior circulation: Both vertebral arteries are patent to the basilar. No basilar stenosis. Posterior circulation branch vessels are normal. Venous  sinuses: Patent and normal. Anatomic variants: None significant. Delayed phase: No abnormal enhancement. Review of the MIP images confirms the above findings IMPRESSION: No acute finding on this CT angiogram. 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 No flow limiting carotid bifurcation stenosis. No significant intracranial finding. Electronically Signed   By: Nelson Chimes M.D.   On: 04/18/2018 10:11   Mr Brain Wo Contrast  Result Date: 04/18/2018 CLINICAL DATA:  Code stroke.  Slurred speech. EXAM: MRI HEAD WITHOUT CONTRAST TECHNIQUE: Multiplanar, multiecho pulse sequences of the brain and surrounding structures were obtained without intravenous contrast. COMPARISON:  CT head and CTA head earlier today. FINDINGS: Brain: Subcentimeter focus of restricted diffusion, LEFT centrum semiovale, corresponding low ADC, consistent with acute infarction. Even smaller punctate foci of apparent restriction related to a LEFT anterior superior frontal gyrus, probably real, but difficult to confirm on coronal sequences. No hemorrhage, mass lesion, hydrocephalus, or extra-axial fluid. Mild to moderate atrophy. T2 and FLAIR hyperintensities in the white matter, both focal and confluent,  moderately severe, likely small vessel disease. Small foci of hemosiderin deposition are seen throughout the brain, most notably RIGHT thalamus, likely hypertensive related. Vascular: Flow voids are maintained. Skull and upper cervical spine: No worrisome osseous lesion. Sinuses/Orbits: Clear sinuses. Other: None. 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 See discussion above. Atrophy with chronic microvascular ischemic change, along with areas of chronic hemorrhage, suggesting hypertensive related cerebrovascular disease. Electronically Signed   By: Staci Righter M.D.   On: 04/18/2018 14:03   Ct Head Code Stroke Wo Contrast  Result Date: 04/18/2018 CLINICAL DATA:  Code stroke. Slurred speech. Gait disturbance. Last seen normal 0700 hours EXAM: CT HEAD WITHOUT CONTRAST TECHNIQUE: Contiguous axial images were obtained from the base of the skull through the vertex without intravenous contrast. COMPARISON:  01/30/2018. FINDINGS: Brain: Age related atrophy. Chronic small-vessel ischemic changes of the white matter. No sign of acute infarction, mass lesion, hemorrhage, hydrocephalus or extra-axial collection. Vascular: There is atherosclerotic calcification of the major vessels at the base of the brain. Skull: Negative Sinuses/Orbits: Clear/normal Other: None ASPECTS ([place] Stroke Program Early CT Score) - Ganglionic level infarction (caudate, lentiform nuclei, internal capsule, insula, M1-M3 cortex): 7 - Supraganglionic infarction (M4-M6 cortex): 3 Total score (0-10 with 10 being normal): 10 IMPRESSION: 1. No acute finding. Atrophy and chronic small-vessel ischemic change. 2. ASPECTS is 10. 3. These results were communicated to Dr. Rory Percy at 9:46 amon 11/9/2019by text page via the [facility] messaging system. Electronically Signed   By: Nelson Chimes M.D.   On: 04/18/2018 09:47   Laurey Morale, MSN, NP-C [facility] [phone]   04/18/2018, 9:40 AM    Attending physician note to follow with Assessment and plan .   Attending addendum Patient seen and examined as an acute code stroke in the emergency room. I have obtained history and performed an independent physical examination. I have reviewed the imaging independently including the CT scan of the head and CTA head and neck which are unremarkable for evidence of acute stroke or large vessel occlusion  Assessment: 77 y.o. female With PMH of HTN,hypothyroidism, CHF and cancer who presented top [facility] ed as a code stroke c/o slurred speech which was actually stuttering speech and bilateral leg weakness.  Patient exam was very inconsistent and effort dependent weakness and stuttering speech-so no TPA was administered. 03/15/18 MRI- L-spine did not show cord compression. She is also had cervical and thoracic spine MRIs  in August which have been unrevealing for any intrinsic cord pathology. She has been evaluated for similar leg weakness episodes in August 2019 in October 2019 but her stuttering speech is a new symptom.   Impression: Stroke versus nonphysiologic episodic bilateral lower extremity weakness Stuttering speech   Recommendations: --MRI brain without contrast --Labs to include magnesium and phosphorus as well as --chest x-ray and urinalysis. --There has been a possibility for conversion disorder raised during her prior assessments by neurology, and in absence of any objective test abnormality that can explain her symptoms, I would consider conversion disorder strongly then.    Addendum MRI of the brain shows a punctate stroke in the left centrum semiovale, not clear if that can completely explain her symptoms but she needs a stroke work-up to optimize risk factors. Recommendations as below -- BP goal : Permissive HTN upto 220/110 mmHg   --MRI Brain  --CTA --Echocardiogram -- ASA -- High intensity Statin if LDL > 70 -- HgbA1c, fasting lipid panel -- PT consult, OT consult,  Speech consult --Telemetry monitoring --Frequent neuro checks --Stroke swallow screen   Stroke team will follow the patient. -- Amie Portland, MD [facility] Pager: [phone] If 7pm to 7am, please call on call as listed on AMION.

## 2018-04-18 NOTE — ED Provider Notes (Signed)
[facility] EMERGENCY DEPARTMENT Provider Note   CSN: [id number] Arrival date & time: 04/18/18  2440     History   Chief Complaint Chief Complaint  Patient presents with  . Code Stroke    HPI Tina Calderon is a 77 y.o. female.  The history is provided by the patient and medical records. No language interpreter was used.   Tina Calderon is a 77 y.o. female  with a PMH as listed below who presents to the Emergency Department complaining of difficulty with her speech which began at around 8 AM this morning.  She is stuttering and states this is abnormal for her.  She knows what words she is trying to get out, but it is taking a long time for her to say them.  She does report having headaches for the last several days.  She has a history of migraines, but has not had any migraines in several years. Associated with lower extremity weakness, however this has been off-and-on for several months now. Had a very thorough workup for weakness during hospital admission on 8/23. Seen in ED again on 10/06 for same and was placed in rehab facility.   Past Medical History:  Diagnosis Date  . Anginal pain ([place])   . Cancer ([place])   . CHF (congestive heart failure) ([place]) 01/22/2012  . Chronic fatigue 07/15/2014  . GERD (gastroesophageal reflux disease) 05/12/2017  . Hypertension   . Hypothyroidism   . ILD (interstitial lung disease) ([place]) 02/04/2012  . IPF (idiopathic pulmonary fibrosis) ([place]) 2013  . Mild intermittent asthma 01/29/2016  . Pelvic relaxation   . Prolapse of bladder   . Shortness of breath   . Thyroid disease     Patient Active Problem List   Diagnosis Date Noted  . Gait abnormality   . Chronic diastolic congestive heart failure ([place])   . Acute blood loss anemia   . Anemia of chronic disease   . Lower extremity weakness 01/31/2018  . Thyroid nodule 01/31/2018  . Tachycardia 01/19/2018  . Prediabetes 12/18/2017  . Preventative health care 12/18/2017  .  GERD (gastroesophageal reflux disease) 05/12/2017  . Essential hypertension 09/25/2016  . Cough variant asthma 06/13/2016  . Allergic rhinitis 01/29/2016  . Mild intermittent asthma 01/29/2016  . Cough 01/03/2015  . Research subject 08/03/2014  . Chronic fatigue 07/15/2014  . Research study patient 06/28/2014  . IPF (idiopathic pulmonary fibrosis) ([place]) 02/04/2012  . Orthostatic hypotension 01/24/2012  . Normocytic anemia 01/22/2012  . Hyperlipidemia 01/22/2012  . Anemia 01/20/2012  . Hypothyroidism   . Pelvic relaxation 11/11/2011    Past Surgical History:  Procedure Laterality Date  . ANTERIOR AND POSTERIOR REPAIR  12/05/2011   Procedure: ANTERIOR (CYSTOCELE) AND POSTERIOR REPAIR (RECTOCELE);  Surgeon: [name], MD;  Location: [facility];  Service: Gynecology;  Laterality: N/A;  with cysto  . Bladder tact  15 years ago  . LEFT HEART CATHETERIZATION WITH CORONARY ANGIOGRAM N/A 01/22/2012   Procedure: LEFT HEART CATHETERIZATION WITH CORONARY ANGIOGRAM;  Surgeon: [name], MD;  Location: [facility] CATH LAB;  Service: Cardiovascular;  Laterality: N/A;  . NECK SURGERY  2010  . SHOULDER SURGERY    . thyroid disease    . TONSILLECTOMY    . VAGINAL HYSTERECTOMY  15 years ago     OB History    Gravida  2   Para  2   Term  2   Preterm      AB  Living  2     SAB      TAB      Ectopic      Multiple      Live Births               Home Medications    Prior to Admission medications   Medication Sig Start Date End Date Taking? Authorizing Provider  acetaminophen (TYLENOL) 325 MG tablet Take 325 mg by mouth every 6 (six) hours as needed for headache.   Yes [provider]  amLODipine (NORVASC) 5 MG tablet Take 1 tablet (5 mg total) by mouth daily. For blood pressure. 04/17/18  Yes [name], NP  aspirin EC 81 MG tablet Take 81 mg by mouth daily.   Yes [provider]  calcium-vitamin D (OSCAL WITH D) 500-200 MG-UNIT per tablet Take  1 tablet by mouth daily.    Yes [provider]  Cranberry 1000 MG CAPS Take 1,000 mg by mouth daily.    Yes [provider]  ESBRIET 267 MG CAPS TAKE 3 CAPSULES BY MOUTH THREE TIMES DAILY WITH FOOD Patient taking differently: Take 801 mg by mouth 3 (three) times daily.  07/15/17  Yes Collene Gobble, MD  levothyroxine (SYNTHROID, LEVOTHROID) 88 MCG tablet TAKE 1 TABLET BY MOUTH EVERY DAY Patient taking differently: Take 88 mcg by mouth daily before breakfast.  01/05/18  Yes [name], NP  loratadine (CLARITIN) 10 MG tablet Take 10 mg by mouth daily.    Yes [provider]  metoprolol succinate (TOPROL-XL) 25 MG 24 hr tablet TAKE 1/2 TABLET BY MOUTH EVERY DAY Patient taking differently: Take 12.5 mg by mouth daily.  04/14/18  Yes [name], NP  Multiple Vitamin (MULTIVITAMIN WITH MINERALS) TABS Take 1 tablet by mouth daily before breakfast. **Centrum silver**   Yes [provider]  omeprazole (PRILOSEC) 20 MG capsule TAKE 1 CAPSULE (20 MG TOTAL) BY MOUTH DAILY. Patient taking differently: Take 20 mg by mouth daily before breakfast.  12/08/17  Yes Byrum, Rose Fillers, MD    Family History Family History  Problem Relation Age of Onset  . Thyroid disease Mother   . Hypertension Mother   . Coronary artery disease Mother   . Dementia Mother   . Aneurysm Father   . Pulmonary fibrosis Sister   . Stroke Other   . Sleep apnea Other   . Cancer Brother        LIVER  . Pulmonary fibrosis Brother     Social History Social History   Tobacco Use  . Smoking status: Never Smoker  . Smokeless tobacco: Never Used  Substance Use Topics  . Alcohol use: No  . Drug use: No     Allergies   Dilaudid [hydromorphone] and Crestor [rosuvastatin calcium]   Review of Systems Review of Systems  Neurological: Positive for speech difficulty, weakness and headaches. Negative for dizziness and numbness.  All other systems reviewed and are  negative.    Physical Exam Updated Vital Signs BP (!) 155/80   Pulse 85   Temp 99.2 F (37.3 C) (Oral)   Resp 15   SpO2 93%   Physical Exam  Constitutional: She is oriented to person, place, and time. She appears well-developed and well-nourished. No distress.  HENT:  Head: Normocephalic and atraumatic.  Cardiovascular: Normal rate, regular rhythm and normal heart sounds.  No murmur heard. Pulmonary/Chest: Effort normal and breath sounds normal. No respiratory distress.  Abdominal: Soft. She exhibits no  distension. There is no tenderness.  Musculoskeletal:  5/5 muscle strength to bilateral upper extremities.  3/5 muscle strength to bilateral lower extremities.  Neurological: She is alert and oriented to person, place, and time.  Alert, oriented, thought content appropriate, able to give a coherent history. Stuttering speech but goal oriented. Able to follow commands.  Cranial Nerves:  II:  Peripheral visual fields grossly normal, pupils equal, round, reactive to light III, IV, VI: EOM intact bilaterally, ptosis not present V,VII: smile symmetric, eyes kept closed tightly against resistance, facial light touch sensation equal VIII: hearing grossly normal IX, X: symmetric soft palate movement, uvula elevates symmetrically  XI: bilateral shoulder shrug symmetric and strong XII: midline tongue extension Sensory to light touch normal in all four extremities.  Skin: Skin is warm and dry.  Nursing note and vitals reviewed.    ED Treatments / Results  Labs (all labs ordered are listed, but only abnormal results are displayed) Labs Reviewed  CBC - Abnormal; Notable for the following components:      Result Value   Hemoglobin 10.9 (*)    MCH 24.5 (*)    All other components within normal limits  COMPREHENSIVE METABOLIC PANEL - Abnormal; Notable for the following components:   Glucose, Bld 109 (*)    Calcium 8.8 (*)    Total Protein 6.4 (*)    GFR calc non Af Amer 54 (*)     All other components within normal limits  URINALYSIS, ROUTINE W REFLEX MICROSCOPIC - Abnormal; Notable for the following components:   Color, Urine STRAW (*)    All other components within normal limits  BRAIN NATRIURETIC PEPTIDE - Abnormal; Notable for the following components:   B Natriuretic Peptide 100.9 (*)    All other components within normal limits  I-STAT CHEM 8, ED - Abnormal; Notable for the following components:   Glucose, Bld 107 (*)    Calcium, Ion 1.08 (*)    All other components within normal limits  PROTIME-INR  APTT  DIFFERENTIAL  MAGNESIUM  PHOSPHORUS  I-STAT TROPONIN, ED  CBG MONITORING, ED    EKG EKG Interpretation  Date/Time:  Saturday April 18 2018 09:59:42 EST Ventricular Rate:  89 PR Interval:    QRS Duration: 78 QT Interval:  380 QTC Calculation: 463 R Axis:   94 Text Interpretation:  Sinus rhythm Probable left atrial enlargement Anterior infarct, old No significant change since last tracing Confirmed by Orlie Dakin [phone]) on 04/18/2018 10:03:07 AM Also confirmed by Orlie Dakin [phone]), editor Philomena Doheny [phone])  on 04/18/2018 2:59:51 PM   Radiology Ct Angio Head W Or Wo Contrast  Result Date: 04/18/2018 CLINICAL DATA:  Code stroke.  Slurred speech.  Gait disturbance. EXAM: CT ANGIOGRAPHY HEAD AND NECK TECHNIQUE: Multidetector CT imaging of the head and neck was performed using the standard protocol during bolus administration of intravenous contrast. Multiplanar CT image reconstructions and MIPs were obtained to evaluate the vascular anatomy. Carotid stenosis measurements (when applicable) are obtained utilizing NASCET criteria, using the distal internal carotid diameter as the denominator. CONTRAST:  72m ISOVUE-370 IOPAMIDOL (ISOVUE-370) INJECTION 76% COMPARISON:  Head CT earlier same day.  MRI 02/02/2018. FINDINGS: CTA NECK FINDINGS Aortic arch: Aortic atherosclerosis. No aneurysm or dissection. Branching pattern is normal without  brachiocephalic vessel origin stenosis. Right carotid system: Common carotid artery widely patent to the bifurcation. Mild atherosclerotic plaque at the carotid bifurcation and ICA bulb but no stenosis. Left carotid system: Common carotid artery widely patent to the bifurcation. Atherosclerotic plaque  at the distal common carotid artery and carotid bifurcation region. Calcified and soft plaque at the bifurcation and ICA bulb with minimal proximal ICA diameter of 4.2 mm. Compared to a more distal cervical ICA diameter of 5 mm, this indicates a 20% stenosis. Vertebral arteries: Left vertebral artery is dominant. Both vertebral artery origins are widely patent. Both vertebral arteries are widely patent through the cervical region to the foramen magnum. Skeleton: Previous ACDF C5-C7. Ordinary degenerative changes above and below that. Other neck: No mass or lymphadenopathy. Upper chest: Scarring and emphysema at the lung apices. Review of the MIP images confirms the above findings CTA HEAD FINDINGS Anterior circulation: Both internal carotid arteries are widely patent through the skull base and siphon regions. The anterior and middle cerebral vessels are patent without proximal stenosis, aneurysm or vascular malformation. No large or medium vessel occlusion identified. Posterior circulation: Both vertebral arteries are patent to the basilar. No basilar stenosis. Posterior circulation branch vessels are normal. Venous sinuses: Patent and normal. Anatomic variants: None significant. Delayed phase: No abnormal enhancement. Review of the MIP images confirms the above findings IMPRESSION: No acute finding on this CT angiogram. 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 No flow limiting carotid bifurcation stenosis. No significant intracranial finding. Electronically Signed   By: Nelson Chimes M.D.   On: 04/18/2018 10:11   Dg Chest 2 View  Result Date: 04/18/2018 CLINICAL DATA:  Patient  with weakness.  Shortness of breath. EXAM: CHEST - 2 VIEW COMPARISON:  Chest radiograph 11/11/2016 FINDINGS: Monitoring leads overlie the patient. Stable cardiomegaly. Large hiatal hernia diffuse bilateral interstitial pulmonary opacities, increased from prior. No definite pleural effusion or pneumothorax. Thoracic spine degenerative changes. IMPRESSION: Cardiomegaly. Increased interstitial opacities favored to represent edema or atypical infection superimposed upon chronic lung disease. Electronically Signed   By: Lovey Newcomer M.D.   On: 04/18/2018 10:58   Ct Angio Neck W Or Wo Contrast  Result Date: 04/18/2018 CLINICAL DATA:  Code stroke.  Slurred speech.  Gait disturbance. EXAM: CT ANGIOGRAPHY HEAD AND NECK TECHNIQUE: Multidetector CT imaging of the head and neck was performed using the standard protocol during bolus administration of intravenous contrast. Multiplanar CT image reconstructions and MIPs were obtained to evaluate the vascular anatomy. Carotid stenosis measurements (when applicable) are obtained utilizing NASCET criteria, using the distal internal carotid diameter as the denominator. CONTRAST:  59m ISOVUE-370 IOPAMIDOL (ISOVUE-370) INJECTION 76% COMPARISON:  Head CT earlier same day.  MRI 02/02/2018. FINDINGS: CTA NECK FINDINGS Aortic arch: Aortic atherosclerosis. No aneurysm or dissection. Branching pattern is normal without brachiocephalic vessel origin stenosis. Right carotid system: Common carotid artery widely patent to the bifurcation. Mild atherosclerotic plaque at the carotid bifurcation and ICA bulb but no stenosis. Left carotid system: Common carotid artery widely patent to the bifurcation. Atherosclerotic plaque at the distal common carotid artery and carotid bifurcation region. Calcified and soft plaque at the bifurcation and ICA bulb with minimal proximal ICA diameter of 4.2 mm. Compared to a more distal cervical ICA diameter of 5 mm, this indicates a 20% stenosis. Vertebral  arteries: Left vertebral artery is dominant. Both vertebral artery origins are widely patent. Both vertebral arteries are widely patent through the cervical region to the foramen magnum. Skeleton: Previous ACDF C5-C7. Ordinary degenerative changes above and below that. Other neck: No mass or lymphadenopathy. Upper chest: Scarring and emphysema at the lung apices. Review of the MIP images confirms the above findings CTA HEAD FINDINGS Anterior circulation: Both internal carotid arteries  are widely patent through the skull base and siphon regions. The anterior and middle cerebral vessels are patent without proximal stenosis, aneurysm or vascular malformation. No large or medium vessel occlusion identified. Posterior circulation: Both vertebral arteries are patent to the basilar. No basilar stenosis. Posterior circulation branch vessels are normal. Venous sinuses: Patent and normal. Anatomic variants: None significant. Delayed phase: No abnormal enhancement. Review of the MIP images confirms the above findings IMPRESSION: No acute finding on this CT angiogram. 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 No flow limiting carotid bifurcation stenosis. No significant intracranial finding. Electronically Signed   By: Nelson Chimes M.D.   On: 04/18/2018 10:11   Mr Brain Wo Contrast  Result Date: 04/18/2018 CLINICAL DATA:  Code stroke.  Slurred speech. EXAM: MRI HEAD WITHOUT CONTRAST TECHNIQUE: Multiplanar, multiecho pulse sequences of the brain and surrounding structures were obtained without intravenous contrast. COMPARISON:  CT head and CTA head earlier today. FINDINGS: Brain: Subcentimeter focus of restricted diffusion, LEFT centrum semiovale, corresponding low ADC, consistent with acute infarction. Even smaller punctate foci of apparent restriction related to a LEFT anterior superior frontal gyrus, probably real, but difficult to confirm on coronal sequences. No hemorrhage, mass  lesion, hydrocephalus, or extra-axial fluid. Mild to moderate atrophy. T2 and FLAIR hyperintensities in the white matter, both focal and confluent, moderately severe, likely small vessel disease. Small foci of hemosiderin deposition are seen throughout the brain, most notably RIGHT thalamus, likely hypertensive related. Vascular: Flow voids are maintained. Skull and upper cervical spine: No worrisome osseous lesion. Sinuses/Orbits: Clear sinuses. Other: None. 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 See discussion above. Atrophy with chronic microvascular ischemic change, along with areas of chronic hemorrhage, suggesting hypertensive related cerebrovascular disease. Electronically Signed   By: Staci Righter M.D.   On: 04/18/2018 14:03   Ct Head Code Stroke Wo Contrast  Result Date: 04/18/2018 CLINICAL DATA:  Code stroke. Slurred speech. Gait disturbance. Last seen normal 0700 hours EXAM: CT HEAD WITHOUT CONTRAST TECHNIQUE: Contiguous axial images were obtained from the base of the skull through the vertex without intravenous contrast. COMPARISON:  01/30/2018. FINDINGS: Brain: Age related atrophy. Chronic small-vessel ischemic changes of the white matter. No sign of acute infarction, mass lesion, hemorrhage, hydrocephalus or extra-axial collection. Vascular: There is atherosclerotic calcification of the major vessels at the base of the brain. Skull: Negative Sinuses/Orbits: Clear/normal Other: None ASPECTS ([place] Stroke Program Early CT Score) - Ganglionic level infarction (caudate, lentiform nuclei, internal capsule, insula, M1-M3 cortex): 7 - Supraganglionic infarction (M4-M6 cortex): 3 Total score (0-10 with 10 being normal): 10 IMPRESSION: 1. No acute finding. Atrophy and chronic small-vessel ischemic change. 2. ASPECTS is 10. 3. These results were communicated to Dr. Rory Percy at 9:46 amon 11/9/2019by text page via the [facility] messaging system. Electronically  Signed   By: Nelson Chimes M.D.   On: 04/18/2018 09:47    Procedures Procedures (including critical care time)  Medications Ordered in ED Medications  iopamidol (ISOVUE-370) 76 % injection (has no administration in time range)  iopamidol (ISOVUE-370) 76 % injection 50 mL (50 mLs Intravenous Contrast Given 04/18/18 0958)  acetaminophen (TYLENOL) tablet 650 mg (650 mg Oral Given 04/18/18 1100)  oxyCODONE (Oxy IR/ROXICODONE) immediate release tablet 5 mg (5 mg Oral Given 04/18/18 1511)     Initial Impression / Assessment and Plan / ED Course  I have reviewed the triage vital signs and the nursing notes.  Pertinent labs & imaging  results that were available during my care of the patient were reviewed by me and considered in my medical decision making (see chart for details).    Tina Calderon is a 77 y.o. female who presents to ED for stuttering/speech difficulty which began this morning.  Associated with headaches for a few days now. She also reports bilateral LE's for several weeks. She has been admitted in the past for this with thorough workup back in August. Decreased strength to bilateral LE's on exam. No cranial nerve deficit noted on exam, however is stuttering throughout exam. Initial labs reviewed and reassuring. CXR shows increased interstitial opacities since previous CXR. She is not experiencing any infectious symptoms. Does not appear volume overloaded. BNP very minimally elevated at 100.9. CT head negative. MRI obtained which does show an acute 5 mm left centrum semiovale infarct. Spoke with neurology who recommends hospitalist admission and neuro will follow. Hospitalist consulted who will admit.   Patient seen by and discussed with Dr. Winfred Leeds who agrees with treatment plan.    Final Clinical Impressions(s) / ED Diagnoses   Final diagnoses:  Weakness  Stuttering  Cerebrovascular accident (CVA), unspecified mechanism [facility])    ED Discharge Orders    None       ,  Ozella Almond, PA-C 04/18/18 1522    Orlie Dakin, MD 04/18/18 1655

## 2018-04-18 NOTE — Progress Notes (Signed)
New Admission Note:  Arrival Method: Via stretcher from ER Mental Orientation: alert & oriented x4 Telemetry:3w01 Assessment: Completed Skin: intact  IV: R ac & L ac Safety Measures: Safety Fall Prevention Plan was given, discussed. 4Z59: Patient has been orientated to the room, unit and the staff. Family:husband @ bedside   Orders have been reviewed and implemented. Will continue to monitor the patient. Call light has been placed within reach and bed alarm has been activated.   Arta Silence ,RN

## 2018-04-18 NOTE — ED Provider Notes (Signed)
Level 5 caveat acuity of situation developed stuttering speech and weakness in both legs sudden onset between 730 and 8:30 AM today.  She denies pain anywhere.  On exam she is alert answers questions appropriately speech is stuttering.  Neck supple trachea midline no bruit heart regular rate and rhythm lungs clear to auscultation abdomen nondistended nontender all 4 extremities without redness swelling or tenderness.  Neurologic cranial nerves II through XII grossly intact.  Speech is stuttering.  Motor strength of bilateral lower extremities 3/5 motor strength of bilateral upper extremities 5/5.  DTRs symmetric bilaterally at knee jerk ankle jerk and biceps.  She is mildly hyperreflexic in lower extremities with 2-3 beat clonus at ankle jerks bilaterally.  Initial NIH stroke scale equals 5   Orlie Dakin, MD 04/18/18 1655

## 2018-04-19 ENCOUNTER — Encounter (HOSPITAL_COMMUNITY): Payer: Self-pay

## 2018-04-19 ENCOUNTER — Other Ambulatory Visit: Payer: Self-pay

## 2018-04-19 ENCOUNTER — Inpatient Hospital Stay (HOSPITAL_COMMUNITY): Payer: Medicare Other

## 2018-04-19 DIAGNOSIS — R471 Dysarthria and anarthria: Secondary | ICD-10-CM | POA: Diagnosis present

## 2018-04-19 DIAGNOSIS — I6300 Cerebral infarction due to thrombosis of unspecified precerebral artery: Secondary | ICD-10-CM | POA: Diagnosis not present

## 2018-04-19 DIAGNOSIS — K219 Gastro-esophageal reflux disease without esophagitis: Secondary | ICD-10-CM | POA: Diagnosis present

## 2018-04-19 DIAGNOSIS — R531 Weakness: Secondary | ICD-10-CM | POA: Diagnosis present

## 2018-04-19 DIAGNOSIS — R4782 Fluency disorder in conditions classified elsewhere: Secondary | ICD-10-CM | POA: Diagnosis present

## 2018-04-19 DIAGNOSIS — I5032 Chronic diastolic (congestive) heart failure: Secondary | ICD-10-CM | POA: Diagnosis present

## 2018-04-19 DIAGNOSIS — E039 Hypothyroidism, unspecified: Secondary | ICD-10-CM | POA: Diagnosis present

## 2018-04-19 DIAGNOSIS — J84112 Idiopathic pulmonary fibrosis: Secondary | ICD-10-CM | POA: Diagnosis present

## 2018-04-19 DIAGNOSIS — R29705 NIHSS score 5: Secondary | ICD-10-CM | POA: Diagnosis present

## 2018-04-19 DIAGNOSIS — I10 Essential (primary) hypertension: Secondary | ICD-10-CM | POA: Diagnosis not present

## 2018-04-19 DIAGNOSIS — Z8349 Family history of other endocrine, nutritional and metabolic diseases: Secondary | ICD-10-CM | POA: Diagnosis not present

## 2018-04-19 DIAGNOSIS — I5030 Unspecified diastolic (congestive) heart failure: Secondary | ICD-10-CM | POA: Diagnosis not present

## 2018-04-19 DIAGNOSIS — I110 Hypertensive heart disease with heart failure: Secondary | ICD-10-CM | POA: Diagnosis present

## 2018-04-19 DIAGNOSIS — I639 Cerebral infarction, unspecified: Secondary | ICD-10-CM | POA: Diagnosis present

## 2018-04-19 DIAGNOSIS — Z7982 Long term (current) use of aspirin: Secondary | ICD-10-CM | POA: Diagnosis not present

## 2018-04-19 DIAGNOSIS — J4520 Mild intermittent asthma, uncomplicated: Secondary | ICD-10-CM | POA: Diagnosis present

## 2018-04-19 DIAGNOSIS — D649 Anemia, unspecified: Secondary | ICD-10-CM | POA: Diagnosis present

## 2018-04-19 DIAGNOSIS — G8311 Monoplegia of lower limb affecting right dominant side: Secondary | ICD-10-CM | POA: Diagnosis present

## 2018-04-19 DIAGNOSIS — Z800 Family history of malignant neoplasm of digestive organs: Secondary | ICD-10-CM | POA: Diagnosis not present

## 2018-04-19 DIAGNOSIS — E785 Hyperlipidemia, unspecified: Secondary | ICD-10-CM | POA: Diagnosis present

## 2018-04-19 DIAGNOSIS — Z8249 Family history of ischemic heart disease and other diseases of the circulatory system: Secondary | ICD-10-CM | POA: Diagnosis not present

## 2018-04-19 LAB — LIPID PANEL
CHOL/HDL RATIO: 5 ratio
CHOLESTEROL: 212 mg/dL — AB (ref 0–200)
HDL: 42 mg/dL (ref >40)
LDL Cholesterol: 132 mg/dL — ABNORMAL HIGH (ref 0–99)
Triglycerides: 190 mg/dL — ABNORMAL HIGH (ref <150)
VLDL: 38 mg/dL (ref 0–40)

## 2018-04-19 LAB — ECHOCARDIOGRAM COMPLETE
HEIGHTINCHES: 62 in
Weight: 2014.12 oz

## 2018-04-19 LAB — HEMOGLOBIN A1C
Hgb A1c MFr Bld: 5.6 % (ref 4.8–5.6)
Mean Plasma Glucose: 114.02 mg/dL

## 2018-04-19 MED ORDER — CLOPIDOGREL BISULFATE 75 MG PO TABS
75.0000 mg | ORAL_TABLET | Freq: Every day | ORAL | Status: DC
  Administered 2018-04-19 – 2018-04-21 (×3): 75 mg via ORAL
  Filled 2018-04-19 (×3): qty 1

## 2018-04-19 NOTE — Evaluation (Signed)
Occupational Therapy Evaluation Patient Details Name: Tina Calderon MRN: [id number] DOB: 14-Apr-1941 Today's Date: 04/19/2018    History of Present Illness 77 y.o. female with medical history significant of Hypothyroidism, asthma, ILD, HTN, GERD, chronic fatigue, chronic CHF, h/o cancer who presents 04/18/18 for acute weakness and stuttering. She reports she developed acute leg weakness and stuttering. 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 PMH-2 recent hospital[facility] due [facility] LE weakness [facility] and then [facility] to [facility]; pulmonary fibrosis, HTN, HLD, CHF   Clinical Impression   PATIENT IS HAVING B LE TREMORS THAT IS LIMITING FUNCTION AND I WITH ADLS AND MOBILITY. PATIENT STATES SHE HAS HAD 2 OTHER BOUTS WITH LE TREMORS AND WEAKNESS THAT SHE WAS HOSPITALIZED FOR AND WAS SENT TO REHAB. PATIENT STATES SHE IS SEEING A NEUROLOGIST TOMORROW TO FIND OUT WHAT IS CAUSING THIS. PATIENT IS MOTIVATED TO WORK WITH OT TO MAXIMIZE FUNCTION AND I.     Follow Up Recommendations  SNF    Equipment Recommendations       Recommendations for Other Services       Precautions / Restrictions Precautions Precautions: Fall Restrictions Weight Bearing Restrictions: No      Mobility Bed Mobility Overal bed mobility: Needs Assistance Bed Mobility: Rolling;Sidelying to Sit;Sit to Sidelying Rolling: Modified independent (Device/Increase time) Sidelying to sit: Min guard     Sit to sidelying: Min guard General bed mobility comments: minguard for safety  Transfers Overall transfer level: Needs assistance Equipment used: Rolling walker (2 wheeled) Transfers: Sit to/from Stand Sit to Stand: Mod assist         General transfer comment: x 3 (no device x 1; with RW x 2); legs become very tremulous in wt-bearing (Rt>Lt)    Balance Overall balance assessment: Needs assistance Sitting-balance support: Bilateral upper extremity supported;Feet  supported Sitting balance-Leahy Scale: Poor     Standing balance support: Bilateral upper extremity supported Standing balance-Leahy Scale: Poor                             ADL either performed or assessed with clinical judgement   ADL Overall ADL's : Needs assistance/impaired Eating/Feeding: Independent   Grooming: Wash/dry hands;Wash/dry face;Oral care;Supervision/safety   Upper Body Bathing: Supervision/ safety;Set up;Sitting   Lower Body Bathing: Minimal assistance;Sit to/from stand   Upper Body Dressing : Minimal assistance;Sitting   Lower Body Dressing: Moderate assistance;Sit to/from stand   Toilet Transfer: Moderate assistance   Toileting- Clothing Manipulation and Hygiene: Moderate assistance       Functional mobility during ADLs: Moderate assistance General ADL Comments: PATIENT IS ABLE TO PERFORM ADLS WITH ASSIT AND INCREASE TIME NEEDED. PATIENT HAS LE TREMORS AND WEAKNESS.     Vision Baseline Vision/History: Wears glasses Wears Glasses: At all times Patient Visual Report: No change from baseline       Perception     Praxis      Pertinent Vitals/Pain Pain Assessment: No/denies pain     Hand Dominance Right   Extremity/Trunk Assessment Upper Extremity Assessment Upper Extremity Assessment: Generalized weakness   Lower Extremity Assessment Lower Extremity Assessment: RLE deficits/detail;LLE deficits/detail RLE Deficits / Details: in sitting: knee extension 3/5, knee flexion ~2/5, ankle DF 3+; in standing, very tremulous (similar to clonus however did not quiet with shifting weight foot to foot or back on her heels RLE Sensation: WNL LLE Deficits / Details: in sitting: knee extension 3+/5, knee flexion ~2+/5,  ankle DF 3+; in standing, very tremulous (similar to clonus however did not quiet with shifting weight foot to foot or back on her heels LLE Sensation: WNL       Communication Communication Communication: Expressive  difficulties   Cognition Arousal/Alertness: Awake/alert Behavior During Therapy: Anxious Overall Cognitive Status: Within Functional Limits for tasks assessed                                     General Comments       Exercises Exercises: Low Level/ICU Low Level/ICU Exercises Ankle Circles/Pumps: AROM;Both;10 reps;Supine Stabilized Bridging: AROM;Strengthening;Both;5 reps;Supine   Shoulder Instructions      Home Living Family/patient expects to be discharged to:: Skilled nursing facility Living Arrangements: Spouse/significant other                               Additional Comments: Husband not able to physically assist       Prior Functioning/Environment Level of Independence: Independent        Comments: had returned to walking without device        OT Problem List: Decreased strength;Decreased activity tolerance      OT Treatment/Interventions: Self-care/ADL training;Neuromuscular education;Therapeutic activities    OT Goals(Current goals can be found in the care plan section) Acute Rehab OT Goals Patient Stated Goal: TO GET BETTER OT Goal Formulation: With patient Time For Goal Achievement: 05/03/18 Potential to Achieve Goals: Good  OT Frequency: Min 2X/week   Barriers to D/C: Decreased caregiver support          Co-evaluation              AM-PAC PT "6 Clicks" Daily Activity     Outcome Measure Help from another person eating meals?: None Help from another person taking care of personal grooming?: A Little Help from another person toileting, which includes using toliet, bedpan, or urinal?: A Lot Help from another person bathing (including washing, rinsing, drying)?: A Lot Help from another person to put on and taking off regular upper body clothing?: A Little Help from another person to put on and taking off regular lower body clothing?: A Lot 6 Click Score: 16   End of Session Nurse Communication: (OK  THERAPY)  Activity Tolerance:   Patient left: in bed;with bed alarm set;with family/visitor present  OT Visit Diagnosis: Unsteadiness on feet (R26.81);Hemiplegia and hemiparesis Hemiplegia - dominant/non-dominant: Non-Dominant Hemiplegia - caused by: Cerebral infarction                Time: 1232-1300 OT Time Calculation (min): 28 min Charges:  OT General Charges $OT Visit: 1 Visit OT Evaluation $OT Eval Moderate Complexity: 1 Mod  6 CLICKS  Tina Calderon 04/19/2018, 1:04 PM

## 2018-04-19 NOTE — Progress Notes (Signed)
STROKE TEAM PROGRESS NOTE   HISTORY OF PRESENT ILLNESS (per record) RAYNEISHA BOUZA is an 77 y.o. female  With PMH of HTN,hypothyroidism, CHF and cancer who presented top [facility] ed as a code stroke c/o slurred speech and leg weakness.   Patient woke up this morning in her usual state of health. She was taking a shower about  And had a sudden onset of bilateral leg weakness and she could not walk. She also complained of slurred speech. Endorses having a light headache, which is unsual for her. Of note patient was seen yesterday at [facility] primary care for a f/u for c/o of bilateral lower extremity weakness on 03/15/18 and 1 month prior to that c/o  of the same.  No prior stroke history. Denies any CP, SOB. Chart review reveals at least two prior presentations, since August 2019, for sudden onset bilateral leg weakness but no speech problems at those times.  ED course:  CT head: no hemorrhage CTA: no LVO BP: 188/108 BG: 148 58.8 Kg  Date last known well: Date: 04/18/2018 Time last known well: Time: 07:00 tPA Given: No: too mild to treat; symptoms inconsistent Modified Rankin: Rankin Score=1 NIHSS:5   SUBJECTIVE (INTERVAL HISTORY) Poor historian.   No major complaints.  MRI - several tiny acute left frontal and CS ischemic infarcts.  There is an old right thalamic bleed with gliosis.  There is periventricular small vessel ischemic disease.    CTA brain and neck negative, but aortic arch atherosclerosis is present.   LDL is 132 and is not diabetic or smoker.  BP is well controlled off treatment.   She tells me that she was faithfully taking ASA 81 mg qd for 4 months before this happened.      OBJECTIVE Vitals:   04/18/18 2343 04/19/18 0351 04/19/18 0731 04/19/18 1233  BP: 137/82 136/84 (!) 144/76 136/81  Pulse: 72 76 70 82  Resp: _0 Temp: 98.3 F (36.8 C) 98.7 F (37.1 C) 97.7 F (36.5 C) 97.7 F (36.5 C)  TempSrc: Oral Oral Oral Oral  SpO2: 98% 98% 95% 96%   Weight:      Height:        CBC:  Recent Labs  Lab 04/18/18 0931 04/18/18 0937  WBC 6.8  --   NEUTROABS 5.2  --   HGB 10.9* 12.6  HCT 36.2 37.0  MCV 81.5  --   PLT 205  --     Basic Metabolic Panel:  Recent Labs  Lab 04/18/18 0931 04/18/18 0937  NA 136 137  K 4.3 4.3  CL 103 100  CO2 26  --   GLUCOSE 109* 107*  BUN 11 11  CREATININE 0.98 0.80  CALCIUM 8.8*  --   MG 1.8  --   PHOS 3.0  --     Lipid Panel:     Component Value Date/Time   CHOL 212 (H) 04/19/2018 0551   TRIG 190 (H) 04/19/2018 0551   HDL 42 04/19/2018 0551   CHOLHDL 5.0 04/19/2018 0551   VLDL 38 04/19/2018 0551   LDLCALC 132 (H) 04/19/2018 0551   HgbA1c:  Lab Results  Component Value Date   HGBA1C 5.6 04/19/2018   Urine Drug Screen: No results found for: LABOPIA, COCAINSCRNUR, LABBENZ, AMPHETMU, THCU, LABBARB  Alcohol Level No results found for: ETH  IMAGING  Ct Angio Head W Or Wo Contrast Ct Angio Neck W Or Wo Contrast 04/18/2018 IMPRESSION:  No acute finding on this CT angiogram.  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 No flow limiting carotid bifurcation stenosis. No significant intracranial finding.    Dg Chest 2 View 04/18/2018 IMPRESSION:  Cardiomegaly. Increased interstitial opacities favored to represent edema or atypical infection superimposed upon chronic lung disease.     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 See discussion above. Atrophy with chronic microvascular ischemic change, along with areas of chronic hemorrhage, suggesting hypertensive related cerebrovascular disease.    Ct Head Code Stroke Wo Contrast 04/18/2018 IMPRESSION:  1. No acute finding. Atrophy and chronic small-vessel ischemic change.  2. ASPECTS is 10.     Transthoracic Echocardiogram - pending 06/19/2017 Study Conclusions - Left ventricle: The cavity size was normal. Wall  thickness was   increased in a pattern of mild LVH. Systolic function was   vigorous. The estimated ejection fraction was in the range of 65%   to 70%. Wall motion was normal; there were no regional wall   motion abnormalities. Doppler parameters are consistent with   abnormal left ventricular relaxation (grade 1 diastolic   dysfunction). Doppler parameters are consistent with   indeterminate ventricular filling pressure. - Aortic valve: Mildly calcified annulus. Trileaflet. - Mitral valve: Mildly calcified annulus. - Left atrium: The atrium was mildly dilated. - Atrial septum: No defect or patent foramen ovale was identified. - Tricuspid valve: There was moderate regurgitation. - Pulmonary arteries: PA peak pressure: 63 mm Hg (S). - Pericardium, extracardiac: A trivial pericardial effusion was   identified.    PHYSICAL EXAM Blood pressure 136/81, pulse 82, temperature 97.7 F (36.5 C), temperature source Oral, resp. rate 18, height _0  (1.575 m), weight 57.1 kg, SpO2 96 %.  Awake,alert, fully oriented. No language issues. Strength 5/5 BUE and BLE. No pronator drift.  Sensory - intact. Coord - intact to FTN       ASSESSMENT/PLAN Ms. TAMEE BATTIN is a 77 y.o. female with history of HTN, hypothyroidism, CHF and cancer  presenting with slurred speech and leg weakness. She did not receive IV t-PA due to inconsistent and mild deficits.   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  CT head - No acute finding.  MRI head - Acute 5 mm nonhemorrhagic LEFT centrum semiovale infarct  MRA head - not performed  CTA H&N - No significant intracranial finding.   Carotid Doppler - CTA neck performed - carotid dopplers not indicated.  2D Echo  - EF 65 - 70%. No cardiac source of emboli identified.   LDL - 132  HgbA1c - 5.6  VTE prophylaxis - Lovenx  Diet  - Heart healthy with thin liquids.  aspirin 81 mg  daily prior to admission, now on aspirin 325 mg daily  Patient counseled to be compliant with her antithrombotic medications  Ongoing aggressive stroke risk factor management  Therapy recommendations:  SNF  Disposition:  Pending  Hypertension  Stable . Permissive hypertension (OK if < 220/120) but gradually normalize in 5-7 days . Long-term BP goal normotensive  Hyperlipidemia  Lipid lowering medication PTA:  none  LDL 132, goal < 70  Current lipid lowering medication Lipitor 40 mg daily  Continue statin at discharge   Other Stroke Risk Factors  Advanced age   Other Active Problems    Plan This patient has several tiny subcortical left hemisphere infarcts with no clear objective residual deficit.  The  only identified risk factors are mildly elevated cholesterol and aortic arch atheroma.  The  Latter may have caused atheroembolism, but I am hesitant to recommend anticoagulation due to recent recurrent falls and bilateral lower weakness complaints prompting multiple hospitalizations.  She has failed ASA and I will switch her to Plavix.  She is on statin now to drive down LDL below 70.    She may be discharged in the morning with follow up in stroke clinic in a month.  Rogue Jury, MS, MD   Hospital day # 0    To contact Stroke Continuity provider, please refer to [email or web address] After hours, contact General Neurology

## 2018-04-19 NOTE — Evaluation (Signed)
Physical Therapy Evaluation Patient Details Name: Tina Calderon MRN: [id number] DOB: June 06, 1941 Today's Date: 04/19/2018   History of Present Illness  (P) 77 y.o. female with medical history significant of Hypothyroidism, asthma, ILD, HTN, GERD, chronic fatigue, chronic CHF, h/o cancer who presents 04/18/18 for acute weakness and stuttering. She reports she developed acute leg weakness and stuttering. 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 PMH-2 recent [facility] LE [facility]; pulmonary fibrosis, HTN, HLD, CHF  Clinical Impression  Pt admitted with above diagnosis. Pt currently with functional limitations due to the deficits listed below (see PT Problem List). Patient with unusual presentation in weight-bearing/standing bil LEs "tremble" very quickly with near knee buckling--RLE worse than LLE. Patient reports this is new compared to her other recent episodes of LE weakness. Shaking/trembling does not subside with weight shifting anterior-posetrior or laterally. Very delayed response when trying to get her legs to advance/step. Pt may benefit from skilled PT to increase their independence and safety with mobility to allow discharge to the venue listed below.       Follow Up Recommendations SNF    Equipment Recommendations  None recommended by PT    Recommendations for Other Services Speech consult     Precautions / Restrictions Precautions Precautions: (P) Fall Restrictions Weight Bearing Restrictions: (P) No      Mobility  Bed Mobility Overal bed mobility: Needs Assistance Bed Mobility: Rolling;Sidelying to Sit;Sit to Sidelying Rolling: Modified independent (Device/Increase time) Sidelying to sit: Min guard     Sit to sidelying: Min guard General bed mobility comments: minguard for safety  Transfers Overall transfer level: Needs assistance Equipment used: Rolling walker (2  wheeled) Transfers: Sit to/from Stand Sit to Stand: Mod assist;Min assist         General transfer comment: x 3 (no device x 1; with RW x 2); legs become very tremulous in wt-bearing (Rt>Lt)  Ambulation/Gait Ambulation/Gait assistance: Min assist Gait Distance (Feet): 1 Feet Assistive device: Rolling walker (2 wheeled) Gait Pattern/deviations: Step-to pattern     General Gait Details: side-step to rt towards HOB; legs with significant delay in responding ; very small steps sideways  Stairs            Wheelchair Mobility    Modified Rankin (Stroke Patients Only) Modified Rankin (Stroke Patients Only) Pre-Morbid Rankin Score: No significant disability Modified Rankin: Severe disability     Balance Overall balance assessment: Needs assistance Sitting-balance support: Bilateral upper extremity supported;Feet supported Sitting balance-Leahy Scale: Poor     Standing balance support: Bilateral upper extremity supported Standing balance-Leahy Scale: Poor                               Pertinent Vitals/Pain Pain Assessment: (P) No/denies pain    Home Living Family/patient expects to be discharged to:: (P) Skilled nursing facility Living Arrangements: (P) Spouse/significant other               Additional Comments: (P) Husband not able to physically assist     Prior Function Level of Independence: (P) Independent         Comments: (P) had returned to walking without device     Hand Dominance   Dominant Hand: (P) Right    Extremity/Trunk Assessment   Upper Extremity Assessment Upper Extremity Assessment: (P) Generalized weakness    Lower Extremity Assessment Lower Extremity  Assessment: RLE deficits/detail;LLE deficits/detail RLE Deficits / Details: in sitting: knee extension 3/5, knee flexion ~2/5, ankle DF 3+; in standing, very tremulous (similar to clonus however did not quiet with shifting weight foot to foot or back on her heels RLE  Sensation: WNL LLE Deficits / Details: in sitting: knee extension 3+/5, knee flexion ~2+/5, ankle DF 3+; in standing, very tremulous (similar to clonus however did not quiet with shifting weight foot to foot or back on her heels LLE Sensation: WNL       Communication   Communication: (P) Expressive difficulties  Cognition Arousal/Alertness: (P) Awake/alert Behavior During Therapy: (P) Anxious Overall Cognitive Status: (P) Within Functional Limits for tasks assessed                                        General Comments      Exercises Low Level/ICU Exercises Ankle Circles/Pumps: AROM;Both;10 reps;Supine Stabilized Bridging: AROM;Strengthening;Both;5 reps;Supine   Assessment/Plan    PT Assessment Patient needs continued PT services  PT Problem List Decreased strength;Decreased activity tolerance;Decreased balance;Decreased mobility;Decreased knowledge of use of DME       PT Treatment Interventions DME instruction;Gait training;Stair training;Functional mobility training;Therapeutic activities;Therapeutic exercise;Balance training;Neuromuscular re-education;Patient/family education    PT Goals (Current goals can be found in the Care Plan section)  Acute Rehab PT Goals Patient Stated Goal: to find out what is causing the leg weakness PT Goal Formulation: With patient Time For Goal Achievement: 04/26/18 Potential to Achieve Goals: Fair    Frequency Min 2X/week   Barriers to discharge        Co-evaluation               AM-PAC PT "6 Clicks" Daily Activity  Outcome Measure Difficulty turning over in bed (including adjusting bedclothes, sheets and blankets)?: A Little Difficulty moving from lying on back to sitting on the side of the bed? : A Little Difficulty sitting down on and standing up from a chair with arms (e.g., wheelchair, bedside commode, etc,.)?: A Lot Help needed moving to and from a bed to chair (including a wheelchair)?: A Lot Help  needed walking in hospital room?: Total Help needed climbing 3-5 steps with a railing? : Total 6 Click Score: 12    End of Session Equipment Utilized During Treatment: Gait belt Activity Tolerance: Patient tolerated treatment well Patient left: in bed;with call bell/phone within reach;with bed alarm set;with family/visitor present Nurse Communication: Mobility status(need to use BSC) PT Visit Diagnosis: Other abnormalities of gait and mobility (R26.89);Other symptoms and signs involving the nervous system (R29.898)    Time: 4403-4742 PT Time Calculation (min) (ACUTE ONLY): 31 min   Charges:   PT Evaluation $PT Eval High Complexity: 1 High            [employer], PT 04/19/2018, 12:47 PM

## 2018-04-19 NOTE — Progress Notes (Signed)
Pt gave home med and was counted and sent to pharmacy. MD aware

## 2018-04-19 NOTE — Progress Notes (Signed)
  Echocardiogram 2D Echocardiogram has been performed.  Tina Calderon 04/19/2018, 10:02 AM

## 2018-04-19 NOTE — Progress Notes (Signed)
[facility]                                                                              Patient Demographics  Tina Calderon, is a 77 y.o. female, DOB - 1941/03/21, ALP:[id number]  Admit date - 04/18/2018   Admitting Physician Sid Falcon, MD  Outpatient Primary MD for the patient is [name], NP  Outpatient specialists:   LOS - 0  days   Medical records reviewed and are as summarized below:    Chief Complaint  Patient presents with  . Code Stroke       Brief summary   Patient is a 77 year old female with hypothyroidism, asthma, ILD, hypertension, GERD, chronic CHF, presented with acute weakness and stuttering.  Patient reported she was in normal state of health until morning morning of admission when she developed acute leg weakness and stuttering, had 2 similar episodes in the past year.   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 -Hemoglobin A1c 5.6  Active Problems:   Hypothyroidism -Continue Synthroid    Normocytic anemia -H&H currently stable    Hyperlipidemia -LDL 132, placed on Lipitor    IPF (idiopathic pulmonary fibrosis) (HCC) -Currently stable, continue home Esbriet -Continue inhalers as needed    Mild intermittent asthma -No wheezing    Essential hypertension -BP currently stable, hold amlodipine and metoprolol due to acute CVA    GERD  (gastroesophageal reflux disease) -Continue PPI    Chronic diastolic congestive heart failure ([place]) Follow 2D echo today, currently euvolemic  Code Status: Full CODE STATUS DVT Prophylaxis:  Lovenox  Family Communication: Discussed in detail with the patient, all imaging results, lab results explained to the patient    Disposition Plan:   Time Spent in minutes   35 minutes  Procedures:  MRI brain CT angiogram head and neck  Consultants:   Neurology  Antimicrobials:      Medications  Scheduled Meds: . aspirin  325 mg Oral Daily  . atorvastatin  40 mg Oral q1800  . calcium-vitamin D  1 tablet Oral Daily  . enoxaparin (LOVENOX) injection  40 mg Subcutaneous Q24H  . levothyroxine  88 mcg Oral QAC breakfast  . loratadine  10 mg Oral Daily  . multivitamin with minerals  1 tablet Oral QAC breakfast  . pantoprazole  40 mg Oral Daily  . Pirfenidone  801 mg Oral TID   Continuous Infusions: . sodium chloride 75 mL/hr at 04/19/18 0906   PRN Meds:.acetaminophen, ipratropium-albuterol, oxyCODONE, senna-docusate   Antibiotics   Anti-infectives (From admission, onward)   None  Subjective:   Tina Calderon was seen and examined today.  Feeling somewhat better today, states speech is improving, still has some word finding difficulty.  LE Weakness is improving.  Patient denies dizziness, chest pain, shortness of breath, abdominal pain, N/V/D/C, new weakness, numbess, tingling.   Objective:   Vitals:   04/18/18 2100 04/18/18 2343 04/19/18 0351 04/19/18 0731  BP: 134/75 137/82 136/84 (!) 144/76  Pulse: 67 72 76 70  Resp: _0 Temp: 98.3 F (36.8 C) 98.3 F (36.8 C) 98.7 F (37.1 C) 97.7 F (36.5 C)  TempSrc: Oral Oral Oral Oral  SpO2: 97% 98% 98% 95%  Weight:      Height:        Intake/Output Summary (Last 24 hours) at 04/19/2018 1146 Last data filed at 04/19/2018 0447 Gross per 24 hour  Intake 822.65 ml  Output -  Net 822.65 ml     Wt  Readings from Last 3 Encounters:  04/18/18 57.1 kg  04/17/18 58.2 kg  04/01/18 58.5 kg     Exam  General: Alert and oriented x 3, NAD  Eyes:   HEENT:    Cardiovascular: S1 S2 auscultated,  Regular rate and rhythm.  Respiratory: Clear to auscultation bilaterally, no wheezing, rales or rhonchi  Gastrointestinal: Soft, nontender, nondistended, + bowel sounds  Ext: no pedal edema bilaterally  Neuro: no new deficits today, speech improving, lower extremity weakness improving  Musculoskeletal: No digital cyanosis, clubbing  Skin: No rashes  Psych: Normal affect and demeanor, alert and oriented x3    Data Reviewed:  I have personally reviewed following labs and imaging studies  Micro Results No results found for this or any previous visit (from the past 240 hour(s)).  Radiology Reports Ct Angio Head W Or Wo Contrast  Result Date: 04/18/2018 CLINICAL DATA:  Code stroke.  Slurred speech.  Gait disturbance. EXAM: CT ANGIOGRAPHY HEAD AND NECK TECHNIQUE: Multidetector CT imaging of the head and neck was performed using the standard protocol during bolus administration of intravenous contrast. Multiplanar CT image reconstructions and MIPs were obtained to evaluate the vascular anatomy. Carotid stenosis measurements (when applicable) are obtained utilizing NASCET criteria, using the distal internal carotid diameter as the denominator. CONTRAST:  101m ISOVUE-370 IOPAMIDOL (ISOVUE-370) INJECTION 76% COMPARISON:  Head CT earlier same day.  MRI 02/02/2018. FINDINGS: CTA NECK FINDINGS Aortic arch: Aortic atherosclerosis. No aneurysm or dissection. Branching pattern is normal without brachiocephalic vessel origin stenosis. Right carotid system: Common carotid artery widely patent to the bifurcation. Mild atherosclerotic plaque at the carotid bifurcation and ICA bulb but no stenosis. Left carotid system: Common carotid artery widely patent to the bifurcation. Atherosclerotic plaque at the distal  common carotid artery and carotid bifurcation region. Calcified and soft plaque at the bifurcation and ICA bulb with minimal proximal ICA diameter of 4.2 mm. Compared to a more distal cervical ICA diameter of 5 mm, this indicates a 20% stenosis. Vertebral arteries: Left vertebral artery is dominant. Both vertebral artery origins are widely patent. Both vertebral arteries are widely patent through the cervical region to the foramen magnum. Skeleton: Previous ACDF C5-C7. Ordinary degenerative changes above and below that. Other neck: No mass or lymphadenopathy. Upper chest: Scarring and emphysema at the lung apices. Review of the MIP images confirms the above findings CTA HEAD FINDINGS Anterior circulation: Both internal carotid arteries are widely patent through the skull base and siphon regions. The anterior and middle cerebral vessels are patent without proximal stenosis, aneurysm or vascular malformation. No  large or medium vessel occlusion identified. Posterior circulation: Both vertebral arteries are patent to the basilar. No basilar stenosis. Posterior circulation branch vessels are normal. Venous sinuses: Patent and normal. Anatomic variants: None significant. Delayed phase: No abnormal enhancement. Review of the MIP images confirms the above findings IMPRESSION: No acute finding on this CT angiogram. 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 No flow limiting carotid bifurcation stenosis. No significant intracranial finding. Electronically Signed   By: Nelson Chimes M.D.   On: 04/18/2018 10:11   Dg Chest 2 View  Result Date: 04/18/2018 CLINICAL DATA:  Patient with weakness.  Shortness of breath. EXAM: CHEST - 2 VIEW COMPARISON:  Chest radiograph 11/11/2016 FINDINGS: Monitoring leads overlie the patient. Stable cardiomegaly. Large hiatal hernia diffuse bilateral interstitial pulmonary opacities, increased from prior. No definite pleural effusion or pneumothorax. Thoracic  spine degenerative changes. IMPRESSION: Cardiomegaly. Increased interstitial opacities favored to represent edema or atypical infection superimposed upon chronic lung disease. Electronically Signed   By: Lovey Newcomer M.D.   On: 04/18/2018 10:58   Ct Angio Neck W Or Wo Contrast  Result Date: 04/18/2018 CLINICAL DATA:  Code stroke.  Slurred speech.  Gait disturbance. EXAM: CT ANGIOGRAPHY HEAD AND NECK TECHNIQUE: Multidetector CT imaging of the head and neck was performed using the standard protocol during bolus administration of intravenous contrast. Multiplanar CT image reconstructions and MIPs were obtained to evaluate the vascular anatomy. Carotid stenosis measurements (when applicable) are obtained utilizing NASCET criteria, using the distal internal carotid diameter as the denominator. CONTRAST:  23m ISOVUE-370 IOPAMIDOL (ISOVUE-370) INJECTION 76% COMPARISON:  Head CT earlier same day.  MRI 02/02/2018. FINDINGS: CTA NECK FINDINGS Aortic arch: Aortic atherosclerosis. No aneurysm or dissection. Branching pattern is normal without brachiocephalic vessel origin stenosis. Right carotid system: Common carotid artery widely patent to the bifurcation. Mild atherosclerotic plaque at the carotid bifurcation and ICA bulb but no stenosis. Left carotid system: Common carotid artery widely patent to the bifurcation. Atherosclerotic plaque at the distal common carotid artery and carotid bifurcation region. Calcified and soft plaque at the bifurcation and ICA bulb with minimal proximal ICA diameter of 4.2 mm. Compared to a more distal cervical ICA diameter of 5 mm, this indicates a 20% stenosis. Vertebral arteries: Left vertebral artery is dominant. Both vertebral artery origins are widely patent. Both vertebral arteries are widely patent through the cervical region to the foramen magnum. Skeleton: Previous ACDF C5-C7. Ordinary degenerative changes above and below that. Other neck: No mass or lymphadenopathy. Upper chest:  Scarring and emphysema at the lung apices. Review of the MIP images confirms the above findings CTA HEAD FINDINGS Anterior circulation: Both internal carotid arteries are widely patent through the skull base and siphon regions. The anterior and middle cerebral vessels are patent without proximal stenosis, aneurysm or vascular malformation. No large or medium vessel occlusion identified. Posterior circulation: Both vertebral arteries are patent to the basilar. No basilar stenosis. Posterior circulation branch vessels are normal. Venous sinuses: Patent and normal. Anatomic variants: None significant. Delayed phase: No abnormal enhancement. Review of the MIP images confirms the above findings IMPRESSION: No acute finding on this CT angiogram. 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 No flow limiting carotid bifurcation stenosis. No significant intracranial finding. Electronically Signed   By: MNelson ChimesM.D.   On: 04/18/2018 10:11   Mr Brain Wo Contrast  Result Date: 04/18/2018 CLINICAL DATA:  Code stroke.  Slurred speech. EXAM: MRI HEAD WITHOUT CONTRAST TECHNIQUE: Multiplanar,  multiecho pulse sequences of the brain and surrounding structures were obtained without intravenous contrast. COMPARISON:  CT head and CTA head earlier today. FINDINGS: Brain: Subcentimeter focus of restricted diffusion, LEFT centrum semiovale, corresponding low ADC, consistent with acute infarction. Even smaller punctate foci of apparent restriction related to a LEFT anterior superior frontal gyrus, probably real, but difficult to confirm on coronal sequences. No hemorrhage, mass lesion, hydrocephalus, or extra-axial fluid. Mild to moderate atrophy. T2 and FLAIR hyperintensities in the white matter, both focal and confluent, moderately severe, likely small vessel disease. Small foci of hemosiderin deposition are seen throughout the brain, most notably RIGHT thalamus, likely hypertensive related.  Vascular: Flow voids are maintained. Skull and upper cervical spine: No worrisome osseous lesion. Sinuses/Orbits: Clear sinuses. Other: None. 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 See discussion above. Atrophy with chronic microvascular ischemic change, along with areas of chronic hemorrhage, suggesting hypertensive related cerebrovascular disease. Electronically Signed   By: Staci Righter M.D.   On: 04/18/2018 14:03   US Thyroid Biopsy  Result Date: 03/31/2018 INDICATION: Indeterminate thyroid nodule of the left inferior thyroid. Request is made for fine-needle aspiration of indeterminate thyroid nodule. EXAM: ULTRASOUND GUIDED FINE NEEDLE ASPIRATION OF INDETERMINATE THYROID NODULE COMPARISON:  US THYROID 03/05/2018 MEDICATIONS: 1 mL 1% lidocaine COMPLICATIONS: None immediate. TECHNIQUE: Informed written consent was obtained from the patient after a discussion of the risks, benefits and alternatives to treatment. Questions regarding the procedure were encouraged and answered. A timeout was performed prior to the initiation of the procedure. Pre-procedural ultrasound scanning demonstrated unchanged size and appearance of the indeterminate nodule within the left inferior thyroid. The procedure was planned. The neck was prepped in the usual sterile fashion, and a sterile drape was applied covering the operative field. A timeout was performed prior to the initiation of the procedure. Local anesthesia was provided with 1% lidocaine. Under direct ultrasound guidance, 5 FNA biopsies were performed of the left inferior thyroid nodule with a 25 gauge needle. Multiple ultrasound images were saved for procedural documentation purposes. The samples were prepared and submitted to pathology. Sample was also prepared for Afirma testing. Limited post procedural scanning was negative for hematoma or additional complication. Dressings were placed. The patient tolerated the  above procedures procedure well without immediate postprocedural complication. FINDINGS: Nodule reference number based on prior diagnostic ultrasound: 3 Maximum size: 2.3 cm Location: Left; Inferior ACR TI-RADS risk category: TR4 (4-6 points) Reason for biopsy: meets ACR TI-RADS criteria Ultrasound imaging confirms appropriate placement of the needles within the thyroid nodule. IMPRESSION: Technically successful ultrasound guided fine needle aspiration of indeterminate nodule of the left inferior thyroid. Read by: Brynda Greathouse PA-C Electronically Signed   By: Corrie Mckusick D.O.   On: 03/31/2018 14:22   Ct Head Code Stroke Wo Contrast  Result Date: 04/18/2018 CLINICAL DATA:  Code stroke. Slurred speech. Gait disturbance. Last seen normal 0700 hours EXAM: CT HEAD WITHOUT CONTRAST TECHNIQUE: Contiguous axial images were obtained from the base of the skull through the vertex without intravenous contrast. COMPARISON:  01/30/2018. FINDINGS: Brain: Age related atrophy. Chronic small-vessel ischemic changes of the white matter. No sign of acute infarction, mass lesion, hemorrhage, hydrocephalus or extra-axial collection. Vascular: There is atherosclerotic calcification of the major vessels at the base of the brain. Skull: Negative Sinuses/Orbits: Clear/normal Other: None ASPECTS ([place] Stroke Program Early CT Score) - Ganglionic level infarction (caudate, lentiform nuclei, internal capsule, insula, M1-M3 cortex): 7 - Supraganglionic infarction (M4-M6 cortex): 3 Total  score (0-10 with 10 being normal): 10 IMPRESSION: 1. No acute finding. Atrophy and chronic small-vessel ischemic change. 2. ASPECTS is 10. 3. These results were communicated to Dr. Rory Percy at 9:46 amon 11/9/2019by text page via the [facility] messaging system. Electronically Signed   By: Nelson Chimes M.D.   On: 04/18/2018 09:47    Lab Data:  CBC: Recent Labs  Lab 04/18/18 0931 04/18/18 0937  WBC 6.8  --   NEUTROABS 5.2  --   HGB 10.9* 12.6  HCT  36.2 37.0  MCV 81.5  --   PLT 205  --    Basic Metabolic Panel: Recent Labs  Lab 04/18/18 0931 04/18/18 0937  NA 136 137  K 4.3 4.3  CL 103 100  CO2 26  --   GLUCOSE 109* 107*  BUN 11 11  CREATININE 0.98 0.80  CALCIUM 8.8*  --   MG 1.8  --   PHOS 3.0  --    GFR: Estimated Creatinine Clearance: 45.8 mL/min (by C-G formula based on SCr of 0.8 mg/dL). Liver Function Tests: Recent Labs  Lab 04/18/18 0931  AST 23  ALT 12  ALKPHOS 52  BILITOT 0.5  PROT 6.4*  ALBUMIN 3.8   No results for input(s): LIPASE, AMYLASE in the last 168 hours. No results for input(s): AMMONIA in the last 168 hours. Coagulation Profile: Recent Labs  Lab 04/18/18 0931  INR 1.03   Cardiac Enzymes: No results for input(s): CKTOTAL, CKMB, CKMBINDEX, TROPONINI in the last 168 hours. BNP (last 3 results) No results for input(s): PROBNP in the last 8760 hours. HbA1C: Recent Labs    04/19/18 0551  HGBA1C 5.6   CBG: Recent Labs  Lab 04/18/18 0930  GLUCAP 97   Lipid Profile: Recent Labs    04/19/18 0551  CHOL 212*  HDL 42  LDLCALC 132*  TRIG 190*  CHOLHDL 5.0   Thyroid Function Tests: No results for input(s): TSH, T4TOTAL, FREET4, T3FREE, THYROIDAB in the last 72 hours. Anemia Panel: No results for input(s): VITAMINB12, FOLATE, FERRITIN, TIBC, IRON, RETICCTPCT in the last 72 hours. Urine analysis:    Component Value Date/Time   COLORURINE STRAW (A) 04/18/2018 1022   APPEARANCEUR CLEAR 04/18/2018 [place] 12/18/2013 1504   LABSPEC 1.015 04/18/2018 1022   LABSPEC 1.015 12/18/2013 1504   PHURINE 8.0 04/18/2018 1022   GLUCOSEU NEGATIVE 04/18/2018 1022   GLUCOSEU Negative 12/18/2013 1504   HGBUR NEGATIVE 04/18/2018 1022   BILIRUBINUR NEGATIVE 04/18/2018 1022   BILIRUBINUR Negative 12/18/2013 1504   KETONESUR NEGATIVE 04/18/2018 1022   PROTEINUR NEGATIVE 04/18/2018 1022   UROBILINOGEN negative 11/13/2011 0907   UROBILINOGEN 0.2 09/19/2009 0518   NITRITE  NEGATIVE 04/18/2018 1022   LEUKOCYTESUR NEGATIVE 04/18/2018 1022   LEUKOCYTESUR 2+ 12/18/2013 1504     Ripudeep Rai M.D. [facility] 04/19/2018, 11:46 AM  Pager: [phone] Between 7am to 7pm - call Pager - [phone]  After 7pm go to [email or web address] - password [email or web address]  Call night coverage person covering after 7pm

## 2018-04-19 NOTE — Evaluation (Signed)
Speech Language Pathology Evaluation Patient Details Name: Tina Calderon MRN: [id number] DOB: 29-Apr-1941 Today's Date: 04/19/2018 Time: 0321-2248 SLP Time Calculation (min) (ACUTE ONLY): 23 min  Problem List:  Patient Active Problem List   Diagnosis Date Noted  . CVA (cerebral vascular accident) ([place]) 04/18/2018  . Stuttering   . Gait abnormality   . Chronic diastolic congestive heart failure ([place])   . Acute blood loss anemia   . Anemia of chronic disease   . Lower extremity weakness 01/31/2018  . Thyroid nodule 01/31/2018  . Tachycardia 01/19/2018  . Prediabetes 12/18/2017  . Preventative health care 12/18/2017  . GERD (gastroesophageal reflux disease) 05/12/2017  . Essential hypertension 09/25/2016  . Cough variant asthma 06/13/2016  . Allergic rhinitis 01/29/2016  . Mild intermittent asthma 01/29/2016  . Cough 01/03/2015  . Research subject 08/03/2014  . Chronic fatigue 07/15/2014  . Research study patient 06/28/2014  . IPF (idiopathic pulmonary fibrosis) ([place]) 02/04/2012  . Orthostatic hypotension 01/24/2012  . Normocytic anemia 01/22/2012  . Hyperlipidemia 01/22/2012  . Anemia 01/20/2012  . Hypothyroidism   . Pelvic relaxation 11/11/2011   Past Medical History:  Past Medical History:  Diagnosis Date  . Anginal pain ([place])   . Cancer ([place])   . CHF (congestive heart failure) ([place]) 01/22/2012  . Chronic fatigue 07/15/2014  . CVA (cerebral vascular accident) ([place]) 04/18/2018  . GERD (gastroesophageal reflux disease) 05/12/2017  . Hypertension   . Hypothyroidism   . ILD (interstitial lung disease) ([place]) 02/04/2012  . IPF (idiopathic pulmonary fibrosis) ([place]) 2013  . Mild intermittent asthma 01/29/2016  . Pelvic relaxation   . Prolapse of bladder   . Shortness of breath   . Thyroid disease    Past Surgical History:  Past Surgical History:  Procedure Laterality Date  . ANTERIOR AND POSTERIOR REPAIR  12/05/2011   Procedure: ANTERIOR (CYSTOCELE) AND POSTERIOR REPAIR  (RECTOCELE);  Surgeon: [name], MD;  Location: [facility];  Service: Gynecology;  Laterality: N/A;  with cysto  . Bladder tact  15 years ago  . LEFT HEART CATHETERIZATION WITH CORONARY ANGIOGRAM N/A 01/22/2012   Procedure: LEFT HEART CATHETERIZATION WITH CORONARY ANGIOGRAM;  Surgeon: [name], MD;  Location[facility] C[facility] LAB;  Service: Cardiovascular;  Laterality: N/A;  . NECK SURGERY  2010  . SHOULDER SURGERY    . thyroid disease    . TONSILLECTOMY    . VAGINAL HYSTERECTOMY  15 years ago   HPI:  77 y.o. female with medical history significant of Hypothyroidism, asthma, ILD, HTN, GERD, chronic fatigue, chronic CHF, h/o cancer who presents 04/18/18 for acute weakness and stuttering. She reports she developed acute leg weakness and stuttering. MRI brain showed a new 67m CVA in the left centrum semiovale. PMH-2 recent hospital[facility] due to LE weakness [facility]; pulmonary fibrosis, HTN, HLD, CHF   Assessment / Plan / Recommendation Clinical Impression   Patient presents with a mild, subcortical aphasia characterized by fluent speech with occasional dysnomia and wordfinding difficulties apparent at mod complex conversation level. Pt reports, "It seems like I say the wrong word at the wrong time." Reading is slow and effortful, with phonemic paraphasias noted x2. Auditory comprehension is intact. Repetition functional for simple sentences, impaired for longer, more complex sentences. She feels her speech has been improving today. Dysfluencies noted frequently: typically initial consonant/syllable repetitions, and some prolongations; ? stuttering vs apraxia of speech. I recommend skilled ST to address the above deficits in order  to improve verbal expression. Will follow acutely; recommend SNF at d/c.     SLP Assessment  SLP Recommendation/Assessment: Patient needs continued Speech Lanaguage Pathology Services SLP Visit Diagnosis: Aphasia  (R47.01);Apraxia (R48.2);Other (comment)(stuttering)    Follow Up Recommendations  Skilled Nursing facility    Frequency and Duration min 1 x/week  1 week      SLP Evaluation Cognition  Overall Cognitive Status: Within Functional Limits for tasks assessed       Comprehension  Auditory Comprehension Overall Auditory Comprehension: Appears within functional limits for tasks assessed Yes/No Questions: Within Functional Limits Commands: Within Functional Limits Conversation: Complex Visual Recognition/Discrimination Discrimination: Within Function Limits Reading Comprehension Reading Status: Impaired Word level: ([place]) Sentence Level: ([place], slowed rate)    Expression Expression Primary Mode of Expression: Verbal Verbal Expression Overall Verbal Expression: Impaired Level of Generative/Spontaneous Verbalization: Conversation Repetition: Impaired Level of Impairment: Sentence level(simple sentences intact; mod-complex impaired) Naming: No impairment(confrontation naming intact) Pragmatics: No impairment Other Verbal Expression Comments: dysnomia, occasional word-finding difficulties in conversation Written Expression Dominant Hand: Right   Oral / Motor  Oral Motor/Sensory Function Overall Oral Motor/Sensory Function: Within functional limits Motor Speech Overall Motor Speech: Impaired Respiration: Within functional limits Phonation: Normal Resonance: Within functional limits Motor Planning: Impaired Level of Impairment: Word Motor Speech Errors: Inconsistent(stuttering; initial consonants/syllables, prolongations) Effective Techniques: Slow rate   GO                   Deneise Lever, MS, [place] Pager: [phone] Office: [phone]  Aliene Altes 04/19/2018, 2:39 PM

## 2018-04-20 DIAGNOSIS — I6300 Cerebral infarction due to thrombosis of unspecified precerebral artery: Secondary | ICD-10-CM

## 2018-04-20 LAB — BASIC METABOLIC PANEL
Anion gap: 6 (ref 5–15)
BUN: 10 mg/dL (ref 8–23)
CALCIUM: 8.7 mg/dL — AB (ref 8.9–10.3)
CO2: 26 mmol/L (ref 22–32)
Chloride: 106 mmol/L (ref 98–111)
Creatinine, Ser: 0.81 mg/dL (ref 0.44–1.00)
GFR calc Af Amer: >60 mL/min (ref >60)
GFR calc non Af Amer: >60 mL/min (ref >60)
GLUCOSE: 88 mg/dL (ref 70–99)
Potassium: 3.8 mmol/L (ref 3.5–5.1)
Sodium: 138 mmol/L (ref 135–145)

## 2018-04-20 LAB — CK: CK TOTAL: 44 U/L (ref 38–234)

## 2018-04-20 MED ORDER — ASPIRIN EC 81 MG PO TBEC
81.0000 mg | DELAYED_RELEASE_TABLET | Freq: Every day | ORAL | Status: DC
  Administered 2018-04-20 – 2018-04-21 (×2): 81 mg via ORAL
  Filled 2018-04-20 (×2): qty 1

## 2018-04-20 MED ORDER — METOPROLOL SUCCINATE ER 25 MG PO TB24
12.5000 mg | ORAL_TABLET | Freq: Every day | ORAL | Status: DC
  Administered 2018-04-20 – 2018-04-21 (×2): 12.5 mg via ORAL
  Filled 2018-04-20 (×2): qty 1

## 2018-04-20 NOTE — Clinical Social Work Note (Signed)
Clinical Social Work Assessment  Patient Details  Name: Tina Calderon MRN: [id number] Date of Birth: 1940/07/08  Date of referral:  04/20/18               Reason for consult:  Facility Placement                Permission sought to share information with:  Chartered certified accountant granted to share information::  Yes, Verbal Permission Granted  Name::        Agency::  [facility]  Relationship::     Contact Information:     Housing/Transportation Living arrangements for the past 2 months:  [place], [place] of Information:  Patient Patient Interpreter Needed:  None Criminal Activity/Legal Involvement Pertinent to Current Situation/Hospitalization:  No - Comment as needed Significant Relationships:  Spouse Lives with:  Self, Spouse Do you feel safe going back to the place where you live?  Yes Need for family participation in patient care:  No (Coment)  Care giving concerns:  Patient from home with spouse, recently discharged from SNF for rehab from previous admission. Patient will need short term rehab to recover from CVA.   Social Worker assessment / plan:  CSW met with patient to discuss recommendation for rehab. Patient discussed her frustrations with just getting back home and now having a stroke, and how it's not the time of year to be having issues because she wants to be shopping for christmas presents for her grandkids and not stuck in the hospital or rehab. CSW validated patient's feelings, and encouraged her to remain positive. Patient requested [facility] for rehab, it's close for her husband to come and visit and she's satisfied with treatment there. CSW sent referral, and confirmed that they can take patient back. Facility to [facility] authorization. CSW to follow.  Employment status:  Retired Nurse, adult PT Recommendations:  [place] / Referral to  community resources:  [place]  Patient/Family's Response to care:  Patient agreeable to SNF.  Patient/Family's Understanding of and Emotional Response to Diagnosis, Current Treatment, and Prognosis:  Patient discussed how frustrating it is that she's had a stroke. Patient expressed how scary it was trying to talk immediately after it happened, that she couldn't explain to her husband what he needed to do and her husband was confused. Patient would like to be home and independent again by Christmas, was spending time writing out her Christmas list to practice using her hands and use her brain to organize things.   Emotional Assessment Appearance:  Appears stated age Attitude/Demeanor/Rapport:  Engaged Affect (typically observed):  Pleasant Orientation:  Oriented to Self, Oriented to Place, Oriented to  Time, Oriented to Situation Alcohol / Substance use:  Not Applicable Psych involvement (Current and /or in the community):  No (Comment)  Discharge Needs  Concerns to be addressed:  Care Coordination Readmission within the last 30 days:  No Current discharge risk:  Physical Impairment, Dependent with Mobility Barriers to Discharge:  Continued Medical Work up, [place], [place] 04/20/2018, 12:22 PM

## 2018-04-20 NOTE — Progress Notes (Signed)
STROKE TEAM PROGRESS NOTE   HISTORY OF PRESENT ILLNESS (per record) Tina Calderon is an 77 y.o. female  With PMH of HTN,hypothyroidism, CHF and cancer who presented top [facility] ed as a code stroke c/o slurred speech and leg weakness.   Patient woke up this morning in her usual state of health. She was taking a shower about  And had a sudden onset of bilateral leg weakness and she could not walk. She also complained of slurred speech. Endorses having a light headache, which is unsual for her. Of note patient was seen yesterday at [facility] primary care for a f/u for c/o of bilateral lower extremity weakness on 03/15/18 and 1 month prior to that c/o  of the same.  No prior stroke history. Denies any CP, SOB. Chart review reveals at least two prior presentations, since August 2019, for sudden onset bilateral leg weakness but no speech problems at those times.  ED course:  CT head: no hemorrhage CTA: no LVO BP: 188/108 BG: 148 58.8 Kg  Date last known well: Date: 04/18/2018 Time last known well: Time: 07:00 tPA Given: No: too mild to treat; symptoms inconsistent Modified Rankin: Rankin Score=1 NIHSS:5   SUBJECTIVE (INTERVAL HISTORY) Patient is doing well. She has no complaints today. She is awaiting transfer to skilled facility for rehabilitation.  Transthoracic echo was unremarkable  CT angiogram of brain and neck show no significant large vessel stenosis or occlusion unremarkable. LDL cholesterol is elevated at 132 and hemoglobin A1c is 5.6.    OBJECTIVE Vitals:   04/20/18 0334 04/20/18 0819 04/20/18 1000 04/20/18 1143  BP: (!) 150/88 (!) 163/89 (!) 146/86 (!) 158/97  Pulse: 74 84 83 83  Resp: _0 Temp: 98.3 F (36.8 C) 97.9 F (36.6 C)  97.9 F (36.6 C)  TempSrc: Oral Oral  Oral  SpO2: 97% 97%  96%  Weight:      Height:        CBC:  Recent Labs  Lab 04/18/18 0931 04/18/18 0937  WBC 6.8  --   NEUTROABS 5.2  --   HGB 10.9* 12.6  HCT 36.2 37.0  MCV 81.5  --    PLT 205  --     Basic Metabolic Panel:  Recent Labs  Lab 04/18/18 0931 04/18/18 0937 04/20/18 0547  NA 136 137 138  K 4.3 4.3 3.8  CL 103 100 106  CO2 26  --  26  GLUCOSE 109* 107* 88  BUN _1 CREATININE 0.98 0.80 0.81  CALCIUM 8.8*  --  8.7*  MG 1.8  --   --   PHOS 3.0  --   --     Lipid Panel:     Component Value Date/Time   CHOL 212 (H) 04/19/2018 0551   TRIG 190 (H) 04/19/2018 0551   HDL 42 04/19/2018 0551   CHOLHDL 5.0 04/19/2018 0551   VLDL 38 04/19/2018 0551   LDLCALC 132 (H) 04/19/2018 0551   HgbA1c:  Lab Results  Component Value Date   HGBA1C 5.6 04/19/2018   Urine Drug Screen: No results found for: LABOPIA, COCAINSCRNUR, LABBENZ, AMPHETMU, THCU, LABBARB  Alcohol Level No results found for: ETH  IMAGING  Ct Angio Head W Or Wo Contrast Ct Angio Neck W Or Wo Contrast 04/18/2018 IMPRESSION:  No acute finding on this CT angiogram. 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 No flow limiting carotid bifurcation stenosis. No significant intracranial finding.    Dg  Chest 2 View 04/18/2018 IMPRESSION:  Cardiomegaly. Increased interstitial opacities favored to represent edema or atypical infection superimposed upon chronic lung disease.     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 See discussion above. Atrophy with chronic microvascular ischemic change, along with areas of chronic hemorrhage, suggesting hypertensive related cerebrovascular disease.    Ct Head Code Stroke Wo Contrast 04/18/2018 IMPRESSION:  1. No acute finding. Atrophy and chronic small-vessel ischemic change.  2. ASPECTS is 10.     Transthoracic Echocardiogram -  Left ventricle: The cavity size was normal. Wall thickness was   increased in a pattern of mild LVH. Systolic function was   vigorous. The estimated ejection fraction was in the range of 65%   to 70%.  Wall motion was normal; there were no regional wall   motion abnormalities  PHYSICAL EXAM Blood pressure (!) 158/97, pulse 83, temperature 97.9 F (36.6 C), temperature source Oral, resp. rate 18, height _0  (1.575 m), weight 57.1 kg, SpO2 96 %.  Awake,alert, fully oriented. No language issues. Strength 5/5 BUE and BLE. No pronator drift.  Sensory - intact. Coord - intact to FTN       ASSESSMENT/PLAN [name] is a 77 y.o. female with history of HTN, hypothyroidism, CHF and cancer  presenting with slurred speech and leg weakness. She did not receive IV t-PA due to inconsistent and mild deficits.   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  CT head - No acute finding.  MRI head - Acute 5 mm nonhemorrhagic LEFT centrum semiovale infarct  MRA head - not performed  CTA H&N - No significant intracranial finding.   Carotid Doppler - CTA neck performed - carotid dopplers not indicated.  2D Echo  - EF 65 - 70%. No cardiac source of emboli identified.   LDL - 132  HgbA1c - 5.6  VTE prophylaxis - Lovenx  Diet  - Heart healthy with thin liquids.  aspirin 81 mg daily prior to admission, now on aspirin 325 mg daily  Patient counseled to be compliant with her antithrombotic medications  Ongoing aggressive stroke risk factor management  Therapy recommendations:  SNF  Disposition:  Pending  Hypertension  Stable . Permissive hypertension (OK if < 220/120) but gradually normalize in 5-7 days . Long-term BP goal normotensive  Hyperlipidemia  Lipid lowering medication PTA:  none  LDL 132, goal < 70  Current lipid lowering medication Lipitor 40 mg daily  Continue statin at discharge   Other Stroke Risk Factors  Advanced age   Other Active Problems    Plan  Recommend aspirin 81 mg and Plavix for 3 weeks followed by Plavix alone. Lipitor for hyperlipidemia. Aggressive risk factor  modification. Transfer to skilled nursing facility when bed available. Stroke in the sign off. Kindly call for questions.greater than 50% time during this 25 minute visit was spent on counseling and coordination of care about her lacunar strokes and answered questions Discussed with Dr. Vernell Morgans, [facility] day # 1    To contact Stroke Continuity provider, please refer to [email or web address] After hours, contact General Neurology

## 2018-04-20 NOTE — NC FL2 (Signed)
[place] LEVEL OF CARE SCREENING TOOL     IDENTIFICATION  Patient Name: Tina Calderon Birthdate: 10-25-1940 Sex: female Admission Date (Current Location): 04/18/2018  [facility] and [place] Number:  Engineering geologist and Address:  [facility] [place] [address], [place], [place] [address]      Provider Number: [id number]  Attending Physician Name and Address:  Mendel Corning, MD  Relative Name and Phone Number:       Current Level of Care: Hospital Recommended Level of Care: [place] Prior Approval Number:    Date Approved/Denied:   PASRR Number: [id number]  Discharge Plan: SNF    Current Diagnoses: Patient Active Problem List   Diagnosis Date Noted  . CVA (cerebral vascular accident) ([place]) 04/18/2018  . Stuttering   . Gait abnormality   . Chronic diastolic congestive heart failure ([place])   . Acute blood loss anemia   . Anemia of chronic disease   . Lower extremity weakness 01/31/2018  . Thyroid nodule 01/31/2018  . Tachycardia 01/19/2018  . Prediabetes 12/18/2017  . Preventative health care 12/18/2017  . GERD (gastroesophageal reflux disease) 05/12/2017  . Essential hypertension 09/25/2016  . Cough variant asthma 06/13/2016  . Allergic rhinitis 01/29/2016  . Mild intermittent asthma 01/29/2016  . Cough 01/03/2015  . Research subject 08/03/2014  . Chronic fatigue 07/15/2014  . Research study patient 06/28/2014  . IPF (idiopathic pulmonary fibrosis) ([place]) 02/04/2012  . Orthostatic hypotension 01/24/2012  . Normocytic anemia 01/22/2012  . Hyperlipidemia 01/22/2012  . Anemia 01/20/2012  . Hypothyroidism   . Pelvic relaxation 11/11/2011    Orientation RESPIRATION BLADDER Height & Weight     Time, Situation, Place, Self  Normal Continent Weight: 125 lb 14.1 oz (57.1 kg) Height:  _0  (157.5 cm)  BEHAVIORAL SYMPTOMS/MOOD NEUROLOGICAL BOWEL NUTRITION STATUS      Continent Diet(see DC summary)   AMBULATORY STATUS COMMUNICATION OF NEEDS Skin   Limited Assist Verbally Normal                       Personal Care Assistance Level of Assistance  Bathing, Feeding, Dressing Bathing Assistance: Limited assistance Feeding assistance: Independent Dressing Assistance: Limited assistance     Functional Limitations Info  Sight, Hearing, Speech Sight Info: Adequate Hearing Info: Adequate Speech Info: Adequate    SPECIAL CARE FACTORS FREQUENCY  PT (By licensed PT), OT (By licensed OT)     PT Frequency: 5x/wk OT Frequency: 5x/wk            Contractures Contractures Info: Not present    Additional Factors Info  Code Status, Allergies Code Status Info: Full Allergies Info: Dilaudid Hydromorphone, Crestor Rosuvastatin Calcium           Current Medications (04/20/2018):  This is the current hospital active medication list Current Facility-Administered Medications  Medication Dose Route Frequency Provider Last Rate Last Dose  . acetaminophen (TYLENOL) tablet 325 mg  325 mg Oral Q6H PRN Sid Falcon, MD   325 mg at 04/19/18 2357  . atorvastatin (LIPITOR) tablet 40 mg  40 mg Oral q1800 Sid Falcon, MD   40 mg at 04/19/18 1729  . calcium-vitamin D (OSCAL WITH D) 500-200 MG-UNIT per tablet 1 tablet  1 tablet Oral Daily Sid Falcon, MD   1 tablet at 04/20/18 1051  . clopidogrel (PLAVIX) tablet 75 mg  75 mg Oral Daily Rogue Jury, MD   75 mg at 04/20/18 1052  .  enoxaparin (LOVENOX) injection 40 mg  40 mg Subcutaneous Q24H Gilles Chiquito B, MD   40 mg at 04/19/18 1729  . ipratropium-albuterol (DUONEB) 0.5-2.5 (3) MG/3ML nebulizer solution 3 mL  3 mL Nebulization Q6H PRN Sid Falcon, MD      . levothyroxine (SYNTHROID, LEVOTHROID) tablet 88 mcg  88 mcg Oral QAC breakfast Sid Falcon, MD   88 mcg at 04/20/18 0537  . loratadine (CLARITIN) tablet 10 mg  10 mg Oral Daily Gilles Chiquito B, MD   10 mg at 04/20/18 1052  . metoprolol succinate (TOPROL-XL) 24 hr  tablet 12.5 mg  12.5 mg Oral Daily Rai, Ripudeep K, MD   12.5 mg at 04/20/18 1052  . multivitamin with minerals tablet 1 tablet  1 tablet Oral QAC breakfast Sid Falcon, MD   1 tablet at 04/20/18 1051  . oxyCODONE (Oxy IR/ROXICODONE) immediate release tablet 5 mg  5 mg Oral Q6H PRN Sid Falcon, MD      . pantoprazole (PROTONIX) EC tablet 40 mg  40 mg Oral Daily Gilles Chiquito B, MD   40 mg at 04/20/18 1052  . Pirfenidone CAPS 801 mg  801 mg Oral TID Sid Falcon, MD   801 mg at 04/20/18 0800  . senna-docusate (Senokot-S) tablet 1 tablet  1 tablet Oral QHS PRN Sid Falcon, MD         Discharge Medications: Please see discharge summary for a list of discharge medications.  Relevant Imaging Results:  Relevant Lab Results:   Additional Information SS#: [id number]  Geralynn Ochs, LCSW

## 2018-04-20 NOTE — Progress Notes (Signed)
[facility]                                                                              Patient Demographics  Tina Calderon, is a 77 y.o. female, DOB - Sep 05, 1940, YEL:[id number]  Admit date - 04/18/2018   Admitting Physician Sid Falcon, MD  Outpatient Primary MD for the patient is [name], NP  Outpatient specialists:   LOS - 1  days   Medical records reviewed and are as summarized below:    Chief Complaint  Patient presents with  . Code Stroke       Brief summary   Patient is a 77 year old female with hypothyroidism, asthma, ILD, hypertension, GERD, chronic CHF, presented with acute weakness and stuttering.  Patient reported she was in normal state of health until morning morning of admission when she developed acute leg weakness and stuttering, had 2 similar episodes in the past year.   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 - CT angiogram head and neck negative - Lipid panel showed LDL of 132, patient started on Lipitor (states had not tolerated Crestor 6 weeks ago due to muscle aches.)  CK 44 - Hemoglobin A1c 5.6 - 2D echo EF 16-24%, grade 1 diastolic dysfunction  - PT eval recommended SNF, social work consulted  Active Problems:   Hypothyroidism -Continue Synthroid    Normocytic anemia -H&H currently stable    Hyperlipidemia -LDL 132, placed on Lipitor    IPF (idiopathic pulmonary fibrosis) (HCC) -Currently stable, continue home Esbriet -Continue inhalers as needed    Mild intermittent asthma -No wheezing    Essential hypertension -BP somewhat elevated, will restart metoprolol, continue  to hold amlodipine    GERD (gastroesophageal reflux disease) -Continue PPI    Chronic diastolic congestive heart failure (HCC) Currently euvolemic, 2D echo showed EF of 65 to 70%  Code Status: Full CODE STATUS DVT Prophylaxis:  Lovenox  Family Communication: Discussed in detail with the patient, all imaging results, lab results explained to the patient    Disposition Plan: Awaiting skilled nursing facility when bed available  Time Spent in minutes 25 minutes  Procedures:  MRI brain CT angiogram head and neck  Consultants:   Neurology  Antimicrobials:      Medications  Scheduled Meds: . atorvastatin  40 mg Oral q1800  . calcium-vitamin D  1 tablet Oral Daily  . clopidogrel  75 mg Oral Daily  . enoxaparin (LOVENOX) injection  40 mg Subcutaneous Q24H  . levothyroxine  88 mcg Oral QAC breakfast  . loratadine  10 mg Oral Daily  . metoprolol succinate  12.5 mg Oral Daily  . multivitamin with minerals  1 tablet Oral QAC breakfast  . pantoprazole  40 mg Oral Daily  . Pirfenidone  801 mg Oral TID   Continuous Infusions:  PRN Meds:.acetaminophen, ipratropium-albuterol, oxyCODONE,  senna-docusate   Antibiotics   Anti-infectives (From admission, onward)   None        Subjective:   Tina Calderon was seen and examined today.  Overall feeling better, concerned about her elevated BP.  Speech is improving.  Lower extremity weakness somewhat better however needs rehab.  Patient denies dizziness, chest pain, shortness of breath, abdominal pain, N/V/D/C, new weakness, numbess, tingling.   Objective:   Vitals:   04/19/18 2356 04/20/18 0334 04/20/18 0819 04/20/18 1000  BP: (!) 158/87 (!) 150/88 (!) 163/89 (!) 146/86  Pulse: 72 74 84 83  Resp: _0 Temp: 97.7 F (36.5 C) 98.3 F (36.8 C) 97.9 F (36.6 C)   TempSrc: Oral Oral Oral   SpO2: 97% 97% 97%   Weight:      Height:       No intake or output data in the 24 hours ending 04/20/18 1215   Wt Readings  from Last 3 Encounters:  04/18/18 57.1 kg  04/17/18 58.2 kg  04/01/18 58.5 kg     Exam   General: Alert and oriented x 3, NAD  Eyes:   HEENT:  Atraumatic, normocephalic  Cardiovascular: S1 S2 auscultated, RRR no pedal edema b/l  Respiratory: CTAB  Gastrointestinal: Soft, nontender, nondistended, + bowel sounds  Ext: no pedal edema bilaterally  Neuro: no new deficits, speech improving  Musculoskeletal: No digital cyanosis, clubbing  Skin: No rashes  Psych: Normal affect and demeanor, alert and oriented x3     Data Reviewed:  I have personally reviewed following labs and imaging studies  Micro Results No results found for this or any previous visit (from the past 240 hour(s)).  Radiology Reports Ct Angio Head W Or Wo Contrast  Result Date: 04/18/2018 CLINICAL DATA:  Code stroke.  Slurred speech.  Gait disturbance. EXAM: CT ANGIOGRAPHY HEAD AND NECK TECHNIQUE: Multidetector CT imaging of the head and neck was performed using the standard protocol during bolus administration of intravenous contrast. Multiplanar CT image reconstructions and MIPs were obtained to evaluate the vascular anatomy. Carotid stenosis measurements (when applicable) are obtained utilizing NASCET criteria, using the distal internal carotid diameter as the denominator. CONTRAST:  57m ISOVUE-370 IOPAMIDOL (ISOVUE-370) INJECTION 76% COMPARISON:  Head CT earlier same day.  MRI 02/02/2018. FINDINGS: CTA NECK FINDINGS Aortic arch: Aortic atherosclerosis. No aneurysm or dissection. Branching pattern is normal without brachiocephalic vessel origin stenosis. Right carotid system: Common carotid artery widely patent to the bifurcation. Mild atherosclerotic plaque at the carotid bifurcation and ICA bulb but no stenosis. Left carotid system: Common carotid artery widely patent to the bifurcation. Atherosclerotic plaque at the distal common carotid artery and carotid bifurcation region. Calcified and soft plaque at the  bifurcation and ICA bulb with minimal proximal ICA diameter of 4.2 mm. Compared to a more distal cervical ICA diameter of 5 mm, this indicates a 20% stenosis. Vertebral arteries: Left vertebral artery is dominant. Both vertebral artery origins are widely patent. Both vertebral arteries are widely patent through the cervical region to the foramen magnum. Skeleton: Previous ACDF C5-C7. Ordinary degenerative changes above and below that. Other neck: No mass or lymphadenopathy. Upper chest: Scarring and emphysema at the lung apices. Review of the MIP images confirms the above findings CTA HEAD FINDINGS Anterior circulation: Both internal carotid arteries are widely patent through the skull base and siphon regions. The anterior and middle cerebral vessels are patent without proximal stenosis, aneurysm or vascular malformation. No large or medium vessel occlusion identified. Posterior circulation:  Both vertebral arteries are patent to the basilar. No basilar stenosis. Posterior circulation branch vessels are normal. Venous sinuses: Patent and normal. Anatomic variants: None significant. Delayed phase: No abnormal enhancement. Review of the MIP images confirms the above findings IMPRESSION: No acute finding on this CT angiogram. 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 No flow limiting carotid bifurcation stenosis. No significant intracranial finding. Electronically Signed   By: Nelson Chimes M.D.   On: 04/18/2018 10:11   Dg Chest 2 View  Result Date: 04/18/2018 CLINICAL DATA:  Patient with weakness.  Shortness of breath. EXAM: CHEST - 2 VIEW COMPARISON:  Chest radiograph 11/11/2016 FINDINGS: Monitoring leads overlie the patient. Stable cardiomegaly. Large hiatal hernia diffuse bilateral interstitial pulmonary opacities, increased from prior. No definite pleural effusion or pneumothorax. Thoracic spine degenerative changes. IMPRESSION: Cardiomegaly. Increased interstitial opacities  favored to represent edema or atypical infection superimposed upon chronic lung disease. Electronically Signed   By: Lovey Newcomer M.D.   On: 04/18/2018 10:58   Ct Angio Neck W Or Wo Contrast  Result Date: 04/18/2018 CLINICAL DATA:  Code stroke.  Slurred speech.  Gait disturbance. EXAM: CT ANGIOGRAPHY HEAD AND NECK TECHNIQUE: Multidetector CT imaging of the head and neck was performed using the standard protocol during bolus administration of intravenous contrast. Multiplanar CT image reconstructions and MIPs were obtained to evaluate the vascular anatomy. Carotid stenosis measurements (when applicable) are obtained utilizing NASCET criteria, using the distal internal carotid diameter as the denominator. CONTRAST:  49m ISOVUE-370 IOPAMIDOL (ISOVUE-370) INJECTION 76% COMPARISON:  Head CT earlier same day.  MRI 02/02/2018. FINDINGS: CTA NECK FINDINGS Aortic arch: Aortic atherosclerosis. No aneurysm or dissection. Branching pattern is normal without brachiocephalic vessel origin stenosis. Right carotid system: Common carotid artery widely patent to the bifurcation. Mild atherosclerotic plaque at the carotid bifurcation and ICA bulb but no stenosis. Left carotid system: Common carotid artery widely patent to the bifurcation. Atherosclerotic plaque at the distal common carotid artery and carotid bifurcation region. Calcified and soft plaque at the bifurcation and ICA bulb with minimal proximal ICA diameter of 4.2 mm. Compared to a more distal cervical ICA diameter of 5 mm, this indicates a 20% stenosis. Vertebral arteries: Left vertebral artery is dominant. Both vertebral artery origins are widely patent. Both vertebral arteries are widely patent through the cervical region to the foramen magnum. Skeleton: Previous ACDF C5-C7. Ordinary degenerative changes above and below that. Other neck: No mass or lymphadenopathy. Upper chest: Scarring and emphysema at the lung apices. Review of the MIP images confirms the above  findings CTA HEAD FINDINGS Anterior circulation: Both internal carotid arteries are widely patent through the skull base and siphon regions. The anterior and middle cerebral vessels are patent without proximal stenosis, aneurysm or vascular malformation. No large or medium vessel occlusion identified. Posterior circulation: Both vertebral arteries are patent to the basilar. No basilar stenosis. Posterior circulation branch vessels are normal. Venous sinuses: Patent and normal. Anatomic variants: None significant. Delayed phase: No abnormal enhancement. Review of the MIP images confirms the above findings IMPRESSION: No acute finding on this CT angiogram. 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 No flow limiting carotid bifurcation stenosis. No significant intracranial finding. Electronically Signed   By: MNelson ChimesM.D.   On: 04/18/2018 10:11   Mr Brain Wo Contrast  Result Date: 04/18/2018 CLINICAL DATA:  Code stroke.  Slurred speech. EXAM: MRI HEAD WITHOUT CONTRAST TECHNIQUE: Multiplanar, multiecho pulse sequences of the brain and surrounding  structures were obtained without intravenous contrast. COMPARISON:  CT head and CTA head earlier today. FINDINGS: Brain: Subcentimeter focus of restricted diffusion, LEFT centrum semiovale, corresponding low ADC, consistent with acute infarction. Even smaller punctate foci of apparent restriction related to a LEFT anterior superior frontal gyrus, probably real, but difficult to confirm on coronal sequences. No hemorrhage, mass lesion, hydrocephalus, or extra-axial fluid. Mild to moderate atrophy. T2 and FLAIR hyperintensities in the white matter, both focal and confluent, moderately severe, likely small vessel disease. Small foci of hemosiderin deposition are seen throughout the brain, most notably RIGHT thalamus, likely hypertensive related. Vascular: Flow voids are maintained. Skull and upper cervical spine: No worrisome osseous  lesion. Sinuses/Orbits: Clear sinuses. Other: None. 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 See discussion above. Atrophy with chronic microvascular ischemic change, along with areas of chronic hemorrhage, suggesting hypertensive related cerebrovascular disease. Electronically Signed   By: Staci Righter M.D.   On: 04/18/2018 14:03   US Thyroid Biopsy  Result Date: 03/31/2018 INDICATION: Indeterminate thyroid nodule of the left inferior thyroid. Request is made for fine-needle aspiration of indeterminate thyroid nodule. EXAM: ULTRASOUND GUIDED FINE NEEDLE ASPIRATION OF INDETERMINATE THYROID NODULE COMPARISON:  US THYROID 03/05/2018 MEDICATIONS: 1 mL 1% lidocaine COMPLICATIONS: None immediate. TECHNIQUE: Informed written consent was obtained from the patient after a discussion of the risks, benefits and alternatives to treatment. Questions regarding the procedure were encouraged and answered. A timeout was performed prior to the initiation of the procedure. Pre-procedural ultrasound scanning demonstrated unchanged size and appearance of the indeterminate nodule within the left inferior thyroid. The procedure was planned. The neck was prepped in the usual sterile fashion, and a sterile drape was applied covering the operative field. A timeout was performed prior to the initiation of the procedure. Local anesthesia was provided with 1% lidocaine. Under direct ultrasound guidance, 5 FNA biopsies were performed of the left inferior thyroid nodule with a 25 gauge needle. Multiple ultrasound images were saved for procedural documentation purposes. The samples were prepared and submitted to pathology. Sample was also prepared for Afirma testing. Limited post procedural scanning was negative for hematoma or additional complication. Dressings were placed. The patient tolerated the above procedures procedure well without immediate postprocedural complication. FINDINGS:  Nodule reference number based on prior diagnostic ultrasound: 3 Maximum size: 2.3 cm Location: Left; Inferior ACR TI-RADS risk category: TR4 (4-6 points) Reason for biopsy: meets ACR TI-RADS criteria Ultrasound imaging confirms appropriate placement of the needles within the thyroid nodule. IMPRESSION: Technically successful ultrasound guided fine needle aspiration of indeterminate nodule of the left inferior thyroid. Read by: Brynda Greathouse PA-C Electronically Signed   By: Corrie Mckusick D.O.   On: 03/31/2018 14:22   Ct Head Code Stroke Wo Contrast  Result Date: 04/18/2018 CLINICAL DATA:  Code stroke. Slurred speech. Gait disturbance. Last seen normal 0700 hours EXAM: CT HEAD WITHOUT CONTRAST TECHNIQUE: Contiguous axial images were obtained from the base of the skull through the vertex without intravenous contrast. COMPARISON:  01/30/2018. FINDINGS: Brain: Age related atrophy. Chronic small-vessel ischemic changes of the white matter. No sign of acute infarction, mass lesion, hemorrhage, hydrocephalus or extra-axial collection. Vascular: There is atherosclerotic calcification of the major vessels at the base of the brain. Skull: Negative Sinuses/Orbits: Clear/normal Other: None ASPECTS ([place] Stroke Program Early CT Score) - Ganglionic level infarction (caudate, lentiform nuclei, internal capsule, insula, M1-M3 cortex): 7 - Supraganglionic infarction (M4-M6 cortex): 3 Total score (0-10 with 10 being normal): 10 IMPRESSION:  1. No acute finding. Atrophy and chronic small-vessel ischemic change. 2. ASPECTS is 10. 3. These results were communicated to Dr. Rory Percy at 9:46 amon 11/9/2019by text page via the [facility] messaging system. Electronically Signed   By: Nelson Chimes M.D.   On: 04/18/2018 09:47    Lab Data:  CBC: Recent Labs  Lab 04/18/18 0931 04/18/18 0937  WBC 6.8  --   NEUTROABS 5.2  --   HGB 10.9* 12.6  HCT 36.2 37.0  MCV 81.5  --   PLT 205  --    Basic Metabolic Panel: Recent Labs  Lab  04/18/18 0931 04/18/18 0937 04/20/18 0547  NA 136 137 138  K 4.3 4.3 3.8  CL 103 100 106  CO2 26  --  26  GLUCOSE 109* 107* 88  BUN _0 CREATININE 0.98 0.80 0.81  CALCIUM 8.8*  --  8.7*  MG 1.8  --   --   PHOS 3.0  --   --    GFR: Estimated Creatinine Clearance: 45.3 mL/min (by C-G formula based on SCr of 0.81 mg/dL). Liver Function Tests: Recent Labs  Lab 04/18/18 0931  AST 23  ALT 12  ALKPHOS 52  BILITOT 0.5  PROT 6.4*  ALBUMIN 3.8   No results for input(s): LIPASE, AMYLASE in the last 168 hours. No results for input(s): AMMONIA in the last 168 hours. Coagulation Profile: Recent Labs  Lab 04/18/18 0931  INR 1.03   Cardiac Enzymes: Recent Labs  Lab 04/20/18 0547  CKTOTAL 44   BNP (last 3 results) No results for input(s): PROBNP in the last 8760 hours. HbA1C: Recent Labs    04/19/18 0551  HGBA1C 5.6   CBG: Recent Labs  Lab 04/18/18 0930  GLUCAP 97   Lipid Profile: Recent Labs    04/19/18 0551  CHOL 212*  HDL 42  LDLCALC 132*  TRIG 190*  CHOLHDL 5.0   Thyroid Function Tests: No results for input(s): TSH, T4TOTAL, FREET4, T3FREE, THYROIDAB in the last 72 hours. Anemia Panel: No results for input(s): VITAMINB12, FOLATE, FERRITIN, TIBC, IRON, RETICCTPCT in the last 72 hours. Urine analysis:    Component Value Date/Time   COLORURINE STRAW (A) 04/18/2018 1022   APPEARANCEUR CLEAR 04/18/2018 [place] 12/18/2013 1504   LABSPEC 1.015 04/18/2018 1022   LABSPEC 1.015 12/18/2013 1504   PHURINE 8.0 04/18/2018 1022   GLUCOSEU NEGATIVE 04/18/2018 1022   GLUCOSEU Negative 12/18/2013 1504   HGBUR NEGATIVE 04/18/2018 1022   BILIRUBINUR NEGATIVE 04/18/2018 1022   BILIRUBINUR Negative 12/18/2013 1504   KETONESUR NEGATIVE 04/18/2018 1022   PROTEINUR NEGATIVE 04/18/2018 1022   UROBILINOGEN negative 11/13/2011 0907   UROBILINOGEN 0.2 09/19/2009 0518   NITRITE NEGATIVE 04/18/2018 1022   LEUKOCYTESUR NEGATIVE 04/18/2018 1022    LEUKOCYTESUR 2+ 12/18/2013 1504     Jubilee Vivero M.D. [facility] 04/20/2018, 12:15 PM  Pager: [phone] Between 7am to 7pm - call Pager - [phone]  After 7pm go to [email or web address] - password [email or web address]  Call night coverage person covering after 7pm

## 2018-04-21 ENCOUNTER — Encounter (HOSPITAL_COMMUNITY): Payer: Self-pay

## 2018-04-21 MED ORDER — SENNOSIDES-DOCUSATE SODIUM 8.6-50 MG PO TABS: 1.0000 | ORAL_TABLET | Freq: Every evening | ORAL | Status: DC | PRN

## 2018-04-21 MED ORDER — PIRFENIDONE 267 MG PO CAPS
801.0000 mg | ORAL_CAPSULE | Freq: Three times a day (TID) | ORAL | Status: DC
  Administered 2018-04-21: 801 mg via ORAL
  Filled 2018-04-21: qty 3

## 2018-04-21 MED ORDER — PIRFENIDONE 267 MG PO CAPS: 801.0000 mg | ORAL_CAPSULE | Freq: Three times a day (TID) | ORAL | Status: DC

## 2018-04-21 MED ORDER — CLOPIDOGREL BISULFATE 75 MG PO TABS: 75.0000 mg | ORAL_TABLET | Freq: Every day | ORAL | Status: DC

## 2018-04-21 MED ORDER — ASPIRIN EC 81 MG PO TBEC: 81.0000 mg | DELAYED_RELEASE_TABLET | Freq: Every day | ORAL | Status: DC

## 2018-04-21 MED ORDER — ATORVASTATIN CALCIUM 40 MG PO TABS: 40.0000 mg | ORAL_TABLET | Freq: Every day | ORAL | Status: DC

## 2018-04-21 NOTE — Progress Notes (Signed)
Gave report to [facility]

## 2018-04-21 NOTE — Clinical Social Work Placement (Signed)
Nurse to call report to [phone]     CLINICAL SOCIAL WORK PLACEMENT  NOTE  Date:  04/21/2018  Patient Details  Name: Tina Calderon MRN: [id number] Date of Birth: Jun 27, 1940  Clinical Social Work is seeking post-discharge placement for this patient at the [place] level of care (*CSW will initial, date and re-position this form in  chart as items are completed):  Yes   Patient/family provided with [place] Work Department's list of facilities offering this level of care within the geographic area requested by the patient (or if unable, by the patient's family).  Yes   Patient/family informed of their freedom to choose among providers that offer the needed level of care, that participate in Medicare, Medicaid or managed care program needed by the patient, have an available bed and are willing to accept the patient.  Yes   Patient/family informed of [facility]'s ownership interest in [facility] and [facility], as well as of the fact that they are under no obligation to receive care at these facilities.  PASRR submitted to EDS on       PASRR number received on       Existing PASRR number confirmed on 04/20/18     FL2 transmitted to all facilities in geographic area requested by pt/family on 04/20/18     FL2 transmitted to all facilities within larger geographic area on       Patient informed that his/her managed care company has contracts with or will negotiate with certain facilities, including the following:        Yes   Patient/family informed of bed offers received.  Patient [facility] bed at [facility]     Physician recommends and patient chooses bed at      Patient to be transferred to [facility] on 04/21/18.  Patient to be transferred to facility by [facility]     Patient family notified on 04/21/18 of transfer.  Name of family member notified:        PHYSICIAN       Additional Comment:     _______________________________________________ Geralynn Ochs, LCSW 04/21/2018, 3:26 PM

## 2018-04-21 NOTE — Discharge Summary (Signed)
Physician Discharge Summary   Patient ID: Tina Calderon MRN: [id number] DOB/AGE: 02-08-1941 77 y.o.  Admit date: 04/18/2018 Discharge date: 04/21/2018  Primary Care Physician:  [name], NP   Recommendations for Outpatient Follow-up:  1. Follow up with PCP in 1-2 weeks 2. Aspirin and Plavix for 3 weeks followed by Plavix alone  Home Health: Patient discharging to skilled nursing facility Equipment/Devices:   Discharge Condition: stable  CODE STATUS: FULL Diet recommendation: Heart healthy diet   Discharge Diagnoses:    . Acute CVA (cerebral vascular accident) ([place]) . Hypothyroidism . Normocytic anemia . Hyperlipidemia . IPF (idiopathic pulmonary fibrosis) ([place]) . Mild intermittent asthma . Essential hypertension . GERD (gastroesophageal reflux disease) . Lower extremity weakness . Chronic diastolic congestive heart failure ([place])   Consults: Neurology   Allergies:   Allergies  Allergen Reactions  . Dilaudid [Hydromorphone] Nausea And Vomiting  . Crestor [Rosuvastatin Calcium] Other (See Comments)    Myalgias, muscle weakness     DISCHARGE MEDICATIONS: Allergies as of 04/21/2018      Reactions   Dilaudid [hydromorphone] Nausea And Vomiting   Crestor [rosuvastatin Calcium] Other (See Comments)   Myalgias, muscle weakness      Medication List    TAKE these medications   acetaminophen 325 MG tablet Commonly known as:  TYLENOL Take 325 mg by mouth every 6 (six) hours as needed for headache.   amLODipine 5 MG tablet Commonly known as:  NORVASC Take 1 tablet (5 mg total) by mouth daily. For blood pressure.   aspirin EC 81 MG tablet Take 1 tablet (81 mg total) by mouth daily. X 3 weeks, then plavix alone What changed:  additional instructions   atorvastatin 40 MG tablet Commonly known as:  LIPITOR Take 1 tablet (40 mg total) by mouth at bedtime.   calcium-vitamin D 500-200 MG-UNIT tablet Commonly known as:  OSCAL WITH D Take 1 tablet by  mouth daily.   clopidogrel 75 MG tablet Commonly known as:  PLAVIX Take 1 tablet (75 mg total) by mouth daily. Start taking on:  04/22/2018   Cranberry 1000 MG Caps Take 1,000 mg by mouth daily.   ESBRIET 267 MG Caps Generic drug:  Pirfenidone TAKE 3 CAPSULES BY MOUTH THREE TIMES DAILY WITH FOOD What changed:  See the new instructions.   levothyroxine 88 MCG tablet Commonly known as:  SYNTHROID, LEVOTHROID TAKE 1 TABLET BY MOUTH EVERY DAY What changed:  when to take this   loratadine 10 MG tablet Commonly known as:  CLARITIN Take 10 mg by mouth daily.   metoprolol succinate 25 MG 24 hr tablet Commonly known as:  TOPROL-XL TAKE 1/2 TABLET BY MOUTH EVERY DAY   multivitamin with minerals Tabs tablet Take 1 tablet by mouth daily before breakfast. **Centrum silver**   omeprazole 20 MG capsule Commonly known as:  PRILOSEC TAKE 1 CAPSULE (20 MG TOTAL) BY MOUTH DAILY. What changed:  See the new instructions.   senna-docusate 8.6-50 MG tablet Commonly known as:  Senokot-S Take 1 tablet by mouth at bedtime as needed for moderate constipation.        Brief H and P: For complete details please refer to admission H and P, but in briefPatient is a 77 year old female with hypothyroidism, asthma, ILD, hypertension, GERD, chronic CHF, presented with acute weakness and stuttering.  Patient reported she was in normal state of health until morning morning of admission when she developed acute leg weakness and stuttering, had 2 similar episodes in the past year.  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  Patient admitted for further work-up  Hospital Course:  Acute CVA (cerebral vascular accident) [facility]), nonhemorrhagic left centrum semiovale - Patient resented with dysarthria and acute lower extremity weakness - CT head was negative however MRI confirmed acute 5 mm nonhemorrhagic left centrum semiovale infarct - CT angiogram head and neck  negative - Lipid panel showed LDL of 132, patient started on Lipitor (states had not tolerated Crestor 6 weeks ago due to muscle aches.)  CK 44 - Hemoglobin A1c 5.6 - 2D echo EF 14-97%, grade 1 diastolic dysfunction  -Neurology was consulted, patient was followed by stroke service, recommended aspirin and Plavix for 3 weeks then Plavix alone -Patient is so far tolerating Lipitor, follow LFTs, CK in 4-6 weeks     Hypothyroidism -Continue Synthroid, TSH 1.5 on 03/15/2018    Normocytic anemia -H&H currently stable    Hyperlipidemia -LDL 132, placed on Lipitor    IPF (idiopathic pulmonary fibrosis) (HCC) -Currently stable, continue home Es[place] -Continue inhalers as needed    Mild intermittent asthma -No wheezing, currently stable    Essential hypertension -Antihypertensives were initially held to allow permissive hypertension -continue metoprolol, amlodipine    GERD (gastroesophageal reflux disease) -Continue PPI    Chronic diastolic congestive heart failure (HCC) Currently euvolemic, 2D echo showed EF of 65 to 70%    Day of Discharge S: No complaints, feeling better, awaiting skilled nursing facility transfer  BP (!) 157/90 (BP Location: Left Arm)   Pulse 73   Temp 97.7 F (36.5 C) (Oral)   Resp 18   Ht _0  (1.575 m)   Wt 57.1 kg   SpO2 95%   BMI 23.02 kg/m   Physical Exam: General: Alert and awake oriented x3 not in any acute distress. HEENT: anicteric sclera, pupils reactive to light and accommodation CVS: S1-S2 clear no murmur rubs or gallops Chest: clear to auscultation bilaterally, no wheezing rales or rhonchi Abdomen: soft nontender, nondistended, normal bowel sounds Extremities: no cyanosis, clubbing or edema noted bilaterally Neuro: no new deficits, no dysarthria   The results of significant diagnostics from this hospitalization (including imaging, microbiology, ancillary and laboratory) are listed below for reference.       Procedures/Studies:  Ct Angio Head W Or Wo Contrast  Result Date: 04/18/2018 CLINICAL DATA:  Code stroke.  Slurred speech.  Gait disturbance. EXAM: CT ANGIOGRAPHY HEAD AND NECK TECHNIQUE: Multidetector CT imaging of the head and neck was performed using the standard protocol during bolus administration of intravenous contrast. Multiplanar CT image reconstructions and MIPs were obtained to evaluate the vascular anatomy. Carotid stenosis measurements (when applicable) are obtained utilizing NASCET criteria, using the distal internal carotid diameter as the denominator. CONTRAST:  68m ISOVUE-370 IOPAMIDOL (ISOVUE-370) INJECTION 76% COMPARISON:  Head CT earlier same day.  MRI 02/02/2018. FINDINGS: CTA NECK FINDINGS Aortic arch: Aortic atherosclerosis. No aneurysm or dissection. Branching pattern is normal without brachiocephalic vessel origin stenosis. Right carotid system: Common carotid artery widely patent to the bifurcation. Mild atherosclerotic plaque at the carotid bifurcation and ICA bulb but no stenosis. Left carotid system: Common carotid artery widely patent to the bifurcation. Atherosclerotic plaque at the distal common carotid artery and carotid bifurcation region. Calcified and soft plaque at the bifurcation and ICA bulb with minimal proximal ICA diameter of 4.2 mm. Compared to a more distal cervical ICA diameter of 5 mm, this indicates a 20% stenosis. Vertebral arteries: Left vertebral artery is dominant. Both vertebral  artery origins are widely patent. Both vertebral arteries are widely patent through the cervical region to the foramen magnum. Skeleton: Previous ACDF C5-C7. Ordinary degenerative changes above and below that. Other neck: No mass or lymphadenopathy. Upper chest: Scarring and emphysema at the lung apices. Review of the MIP images confirms the above findings CTA HEAD FINDINGS Anterior circulation: Both internal carotid arteries are widely patent through the skull base and siphon  regions. The anterior and middle cerebral vessels are patent without proximal stenosis, aneurysm or vascular malformation. No large or medium vessel occlusion identified. Posterior circulation: Both vertebral arteries are patent to the basilar. No basilar stenosis. Posterior circulation branch vessels are normal. Venous sinuses: Patent and normal. Anatomic variants: None significant. Delayed phase: No abnormal enhancement. Review of the MIP images confirms the above findings IMPRESSION: No acute finding on this CT angiogram. 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 No flow limiting carotid bifurcation stenosis. No significant intracranial finding. Electronically Signed   By: Nelson Chimes M.D.   On: 04/18/2018 10:11   Dg Chest 2 View  Result Date: 04/18/2018 CLINICAL DATA:  Patient with weakness.  Shortness of breath. EXAM: CHEST - 2 VIEW COMPARISON:  Chest radiograph 11/11/2016 FINDINGS: Monitoring leads overlie the patient. Stable cardiomegaly. Large hiatal hernia diffuse bilateral interstitial pulmonary opacities, increased from prior. No definite pleural effusion or pneumothorax. Thoracic spine degenerative changes. IMPRESSION: Cardiomegaly. Increased interstitial opacities favored to represent edema or atypical infection superimposed upon chronic lung disease. Electronically Signed   By: Lovey Newcomer M.D.   On: 04/18/2018 10:58   Ct Angio Neck W Or Wo Contrast  Result Date: 04/18/2018 CLINICAL DATA:  Code stroke.  Slurred speech.  Gait disturbance. EXAM: CT ANGIOGRAPHY HEAD AND NECK TECHNIQUE: Multidetector CT imaging of the head and neck was performed using the standard protocol during bolus administration of intravenous contrast. Multiplanar CT image reconstructions and MIPs were obtained to evaluate the vascular anatomy. Carotid stenosis measurements (when applicable) are obtained utilizing NASCET criteria, using the distal internal carotid diameter as the  denominator. CONTRAST:  79m ISOVUE-370 IOPAMIDOL (ISOVUE-370) INJECTION 76% COMPARISON:  Head CT earlier same day.  MRI 02/02/2018. FINDINGS: CTA NECK FINDINGS Aortic arch: Aortic atherosclerosis. No aneurysm or dissection. Branching pattern is normal without brachiocephalic vessel origin stenosis. Right carotid system: Common carotid artery widely patent to the bifurcation. Mild atherosclerotic plaque at the carotid bifurcation and ICA bulb but no stenosis. Left carotid system: Common carotid artery widely patent to the bifurcation. Atherosclerotic plaque at the distal common carotid artery and carotid bifurcation region. Calcified and soft plaque at the bifurcation and ICA bulb with minimal proximal ICA diameter of 4.2 mm. Compared to a more distal cervical ICA diameter of 5 mm, this indicates a 20% stenosis. Vertebral arteries: Left vertebral artery is dominant. Both vertebral artery origins are widely patent. Both vertebral arteries are widely patent through the cervical region to the foramen magnum. Skeleton: Previous ACDF C5-C7. Ordinary degenerative changes above and below that. Other neck: No mass or lymphadenopathy. Upper chest: Scarring and emphysema at the lung apices. Review of the MIP images confirms the above findings CTA HEAD FINDINGS Anterior circulation: Both internal carotid arteries are widely patent through the skull base and siphon regions. The anterior and middle cerebral vessels are patent without proximal stenosis, aneurysm or vascular malformation. No large or medium vessel occlusion identified. Posterior circulation: Both vertebral arteries are patent to the basilar. No basilar stenosis. Posterior circulation branch vessels are normal. Venous sinuses:  Patent and normal. Anatomic variants: None significant. Delayed phase: No abnormal enhancement. Review of the MIP images confirms the above findings IMPRESSION: No acute finding on this CT angiogram. There is atherosclerotic disease of the  aorta and both carotid bifurcations. No flow limiting carotid bifurcation stenosis. No significant intracranial finding. Electronically Signed   By: Nelson Chimes M.D.   On: 04/18/2018 10:11   Mr Brain Wo Contrast  Result Date: 04/18/2018 CLINICAL DATA:  Code stroke.  Slurred speech. EXAM: MRI HEAD WITHOUT CONTRAST TECHNIQUE: Multiplanar, multiecho pulse sequences of the brain and surrounding structures were obtained without intravenous contrast. COMPARISON:  CT head and CTA head earlier today. FINDINGS: Brain: Subcentimeter focus of restricted diffusion, LEFT centrum semiovale, corresponding low ADC, consistent with acute infarction. Even smaller punctate foci of apparent restriction related to a LEFT anterior superior frontal gyrus, probably real, but difficult to confirm on coronal sequences. No hemorrhage, mass lesion, hydrocephalus, or extra-axial fluid. Mild to moderate atrophy. T2 and FLAIR hyperintensities in the white matter, both focal and confluent, moderately severe, likely small vessel disease. Small foci of hemosiderin deposition are seen throughout the brain, most notably RIGHT thalamus, likely hypertensive related. Vascular: Flow voids are maintained. Skull and upper cervical spine: No worrisome osseous lesion. Sinuses/Orbits: Clear sinuses. Other: None. 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 See discussion above. Atrophy with chronic microvascular ischemic change, along with areas of chronic hemorrhage, suggesting hypertensive related cerebrovascular disease. Electronically Signed   By: Staci Righter M.D.   On: 04/18/2018 14:03   US Thyroid Biopsy  Result Date: 03/31/2018 INDICATION: Indeterminate thyroid nodule of the left inferior thyroid. Request is made for fine-needle aspiration of indeterminate thyroid nodule. EXAM: ULTRASOUND GUIDED FINE NEEDLE ASPIRATION OF INDETERMINATE THYROID NODULE COMPARISON:  US THYROID 03/05/2018 MEDICATIONS: 1  mL 1% lidocaine COMPLICATIONS: None immediate. TECHNIQUE: Informed written consent was obtained from the patient after a discussion of the risks, benefits and alternatives to treatment. Questions regarding the procedure were encouraged and answered. A timeout was performed prior to the initiation of the procedure. Pre-procedural ultrasound scanning demonstrated unchanged size and appearance of the indeterminate nodule within the left inferior thyroid. The procedure was planned. The neck was prepped in the usual sterile fashion, and a sterile drape was applied covering the operative field. A timeout was performed prior to the initiation of the procedure. Local anesthesia was provided with 1% lidocaine. Under direct ultrasound guidance, 5 FNA biopsies were performed of the left inferior thyroid nodule with a 25 gauge needle. Multiple ultrasound images were saved for procedural documentation purposes. The samples were prepared and submitted to pathology. Sample was also prepared for Afirma testing. Limited post procedural scanning was negative for hematoma or additional complication. Dressings were placed. The patient tolerated the above procedures procedure well without immediate postprocedural complication. FINDINGS: Nodule reference number based on prior diagnostic ultrasound: 3 Maximum size: 2.3 cm Location: Left; Inferior ACR TI-RADS risk category: TR4 (4-6 points) Reason for biopsy: meets ACR TI-RADS criteria Ultrasound imaging confirms appropriate placement of the needles within the thyroid nodule. IMPRESSION: Technically successful ultrasound guided fine needle aspiration of indeterminate nodule of the left inferior thyroid. Read by: Brynda Greathouse PA-C Electronically Signed   By: Corrie Mckusick D.O.   On: 03/31/2018 14:22   Ct Head Code Stroke Wo Contrast  Result Date: 04/18/2018 CLINICAL DATA:  Code stroke. Slurred speech. Gait disturbance. Last seen normal 0700 hours EXAM: CT HEAD WITHOUT CONTRAST  TECHNIQUE: Contiguous axial images  were obtained from the base of the skull through the vertex without intravenous contrast. COMPARISON:  01/30/2018. FINDINGS: Brain: Age related atrophy. Chronic small-vessel ischemic changes of the white matter. No sign of acute infarction, mass lesion, hemorrhage, hydrocephalus or extra-axial collection. Vascular: There is atherosclerotic calcification of the major vessels at the base of the brain. Skull: Negative Sinuses/Orbits: Clear/normal Other: None ASPECTS ([place] Stroke Program Early CT Score) - Ganglionic level infarction (caudate, lentiform nuclei, internal capsule, insula, M1-M3 cortex): 7 - Supraganglionic infarction (M4-M6 cortex): 3 Total score (0-10 with 10 being normal): 10 IMPRESSION: 1. No acute finding. Atrophy and chronic small-vessel ischemic change. 2. ASPECTS is 10. 3. These results were communicated to Dr. Rory Percy at 9:46 amon 11/9/2019by text page via the [facility] messaging system. Electronically Signed   By: Nelson Chimes M.D.   On: 04/18/2018 09:47       LAB RESULTS: Basic Metabolic Panel: Recent Labs  Lab 04/18/18 0931 04/18/18 0937 04/20/18 0547  NA 136 137 138  K 4.3 4.3 3.8  CL 103 100 106  CO2 26  --  26  GLUCOSE 109* 107* 88  BUN _0 CREATININE 0.98 0.80 0.81  CALCIUM 8.8*  --  8.7*  MG 1.8  --   --   PHOS 3.0  --   --    Liver Function Tests: Recent Labs  Lab 04/18/18 0931  AST 23  ALT 12  ALKPHOS 52  BILITOT 0.5  PROT 6.4*  ALBUMIN 3.8   No results for input(s): LIPASE, AMYLASE in the last 168 hours. No results for input(s): AMMONIA in the last 168 hours. CBC: Recent Labs  Lab 04/18/18 0931 04/18/18 0937  WBC 6.8  --   NEUTROABS 5.2  --   HGB 10.9* 12.6  HCT 36.2 37.0  MCV 81.5  --   PLT 205  --    Cardiac Enzymes: Recent Labs  Lab 04/20/18 0547  CKTOTAL 44   BNP: Invalid input(s): POCBNP CBG: Recent Labs  Lab 04/18/18 0930  GLUCAP 97      Disposition and Follow-up: Discharge  Instructions    Ambulatory referral to Neurology   Complete by:  As directed    An appointment is requested in approximately: 4 Week(s): stroke follow-up   Diet - low sodium heart healthy   Complete by:  As directed    Increase activity slowly   Complete by:  As directed        DISPOSITION: Skilled nursing facility   [place]    [name], NP. Schedule an appointment as soon as possible for a visit in 2 week(s).   Specialty:  Internal Medicine Contact information: [place] [address] [phone]        Buford Dresser, MD .   Specialty:  Cardiology Contact information: [address] [place] [address] [phone]        Garvin Fila, MD. Schedule an appointment as soon as possible for a visit in 4 week(s).   Specialties:  Neurology, Radiology Why:  stroke follow-up Contact information: [place] [address] [phone]            Time coordinating discharge:  59mns   Signed:   REstill CottaM.D. [facility] 04/21/2018, 10:33 AM Pager: [phone]

## 2018-04-21 NOTE — Progress Notes (Signed)
  Speech Language Pathology Treatment:    Patient Details Name: Tina Calderon MRN: [id number] DOB: 03/25/1941 Today's Date: 04/21/2018 Time: 1561-5379 SLP Time Calculation (min) (ACUTE ONLY): 22 min  Assessment / Plan / Recommendation Clinical Impression  Patient was happy to announce she is discharging to [facility] for rehab. During lengthy conversation, there were no dysfluencies or word finding difficulties detected. Patient expressed she has more word finding difficulties when she has been talking a lot and begins to grow tired. She feels her speech has improved since evaluation. Her reading rate continues to be slow, but without error. Continue plan of care of next facility.    HPI HPI: 77 y.o. female with medical history significant of Hypothyroidism, asthma, ILD, HTN, GERD, chronic fatigue, chronic CHF, h/o cancer who presents 04/18/18 for acute weakness and stuttering. She reports she developed acute leg weakness and stuttering. MRI brain showed a new 68m CVA in the left centrum semiovale. PMH-2 recent hospitalizations due to LE weakness [facility] then [facility] to [facility]; pulmonary fibrosis, HTN, HLD, CHF      SLP Plan  Continue with current plan of care       Recommendations   2Xwk                Follow up Recommendations: Skilled Nursing facility SLP Visit Diagnosis: Aphasia (R47.01);Apraxia (R48.2);Other (comment) Plan: Continue with current plan of care       GDecherd MA, CCC-SLP 04/21/2018 12:44 PM

## 2018-04-21 NOTE — Progress Notes (Addendum)
IV and tele removed. [facility] transported pt to facility. No new concerns  Returned home meds Pirfenidone

## 2018-04-22 ENCOUNTER — Ambulatory Visit: Payer: Medicare Other | Admitting: Cardiology

## 2018-04-23 ENCOUNTER — Ambulatory Visit: Payer: Medicare Other | Admitting: Cardiovascular Disease

## 2018-04-23 DIAGNOSIS — E041 Nontoxic single thyroid nodule: Secondary | ICD-10-CM

## 2018-04-28 ENCOUNTER — Ambulatory Visit: Payer: Medicare Other | Admitting: Primary Care

## 2018-05-06 ENCOUNTER — Ambulatory Visit: Payer: Medicare Other | Admitting: Cardiology

## 2018-05-12 ENCOUNTER — Ambulatory Visit: Payer: Medicare Other | Admitting: Primary Care

## 2018-05-12 ENCOUNTER — Ambulatory Visit: Payer: Medicare Other | Admitting: Neurology

## 2018-05-12 ENCOUNTER — Encounter: Payer: Self-pay | Admitting: Neurology

## 2018-05-12 VITALS — BP 125/74 | HR 100 | Ht 62.0 in | Wt 126.0 lb

## 2018-05-12 DIAGNOSIS — R29898 Other symptoms and signs involving the musculoskeletal system: Secondary | ICD-10-CM | POA: Diagnosis not present

## 2018-05-12 DIAGNOSIS — I6381 Other cerebral infarction due to occlusion or stenosis of small artery: Secondary | ICD-10-CM | POA: Diagnosis not present

## 2018-05-12 NOTE — Patient Instructions (Signed)
I had a long d/w patient and her sister about her recent lacunar stroke and recurrent transient bilateral leg weakness, risk for recurrent stroke/TIAs, personally independently reviewed imaging studies and stroke evaluation results and answered questions.Continue Plavix  for secondary stroke prevention and maintain strict control of hypertension with blood pressure goal below 130/90, diabetes with hemoglobin A1c goal below 6.5% and lipids with LDL cholesterol goal below 70 mg/dL. I also advised the patient to eat a healthy diet with plenty of whole grains, cereals, fruits and vegetables, exercise regularly and maintain ideal body weight . CheclkNCV/EMG study and muscle enzymes and ESR to check for neuromuscular causes of leg weakness.Followup in the future with my nurse practitioner Janett Billow in 3 months or call earlier if necessary.

## 2018-05-12 NOTE — Progress Notes (Signed)
[facility] [address] [place]. [place] [address] [phone]       OFFICE CONSULT NOTE  Tina Calderon Date of Birth:  06-15-1940 Medical Record Number:  [id number]   Referring MD: Estill Cotta Reason for Referral: Leg weakness  HPI: Tina Calderon is a 77 year old pleasant Caucasian lady seen today for initial office consultation visit for leg weakness episodes.  History is obtained from her as well as her sister was accompanying her and review of electronic medical records.  I personally reviewed imaging films in PACS.  She was recently admitted to [facility] on 04/18/2018 when she developed sudden onset of leg weakness.  She initially stated she is also had some speech difficulties.  She was admitted for stroke work-up.  CT head showed no acute abnormality and only changes of chronic small vessel disease.  CT angiogram showed no large vessel intracranial or extracranial stenosis.  MRI scan of the brain showed a small 5 mm left centrum semiovale lacunar infarct.  Transthoracic echo showed normal ejection fraction.  LDL cholesterol was elevated at 132 mg percent and hemoglobin A1c was 5.6.  Patient was started on dual antiplatelet therapy of aspirin and Plavix for 3 weeks and is currently on Plavix alone.  The patient stated she had had 2 previous episodes of sudden onset of bilateral leg weakness on 01/31/2018 as well as 03/15/2018 when she was seen in the hospital on both occasions.  Brain imaging on both occasions did not show an acute stroke.  She went underwent MRI scan of the cervical thoracic and lumbar spine during the initial admission on 01/31/2018 which I personally reviewed did not show significant compressive etiology to explain her leg weakness.  Patient denies any tingling numbness paresthesias.  She does complain of some heaviness and pain in her thigh muscles intermittently.  She is tolerating Lipitor well without any side effects.  Blood pressure is well  controlled today it is 125/74.  She denies significant bruising or bleeding on Plavix.  She is currently participating in home physical and occupational therapy.  She does have remote history of cervical spine surgery and C5-7 fusion by Dr. Lorin Mercy in 2010 but denies any symptoms of neck pain or radicular pain.  She states she has had this frequent episodes of legs giving out and passing without loss of consciousness or injury multiple times over the years.  There are no specific triggers for these episodes except she feels that when it is hot she is more likely to fall.  She has no history of significant head injury or loss of consciousness, seizures migraine headaches.  ROS:   14 system review of systems is positive for fatigue, shortness of breath, weakness, slurred speech and all other systems negative  PMH:  Past Medical History:  Diagnosis Date  . Anginal pain ([place])   . Cancer ([place])   . CHF (congestive heart failure) ([place]) 01/22/2012  . Chronic fatigue 07/15/2014  . CVA (cerebral vascular accident) ([place]) 04/18/2018  . GERD (gastroesophageal reflux disease) 05/12/2017  . Hypertension   . Hypothyroidism   . ILD (interstitial lung disease) ([place]) 02/04/2012  . IPF (idiopathic pulmonary fibrosis) ([place]) 2013  . Mild intermittent asthma 01/29/2016  . Pelvic relaxation   . Prolapse of bladder   . Shortness of breath   . Thyroid disease     Social History:  Social History   Socioeconomic History  . Marital status: Married    Spouse name: Not on file  .  Number of children: Not on file  . Years of education: Not on file  . Highest education level: Not on file  Occupational History  . Not on file  Social Needs  . Financial resource strain: Not on file  . Food insecurity:    Worry: Not on file    Inability: Not on file  . Transportation needs:    Medical: Not on file    Non-medical: Not on file  Tobacco Use  . Smoking status: Never Smoker  . Smokeless tobacco: Never Used  Substance and  Sexual Activity  . Alcohol use: No  . Drug use: No  . Sexual activity: Never    Birth control/protection: Post-menopausal, Surgical    Comment: HYST  Lifestyle  . Physical activity:    Days per week: Not on file    Minutes per session: Not on file  . Stress: Not on file  Relationships  . Social connections:    Talks on phone: Not on file    Gets together: Not on file    Attends religious service: Not on file    Active member of club or organization: Not on file    Attends meetings of clubs or organizations: Not on file    Relationship status: Not on file  . Intimate partner violence:    Fear of current or ex partner: Not on file    Emotionally abused: Not on file    Physically abused: Not on file    Forced sexual activity: Not on file  Other Topics Concern  . Not on file  Social History Narrative   Married.   2 children, 5 grandchildren.   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   Enjoys puzzles, sewing, traveling, reading.     Medications:   Current Outpatient Medications on File Prior to Visit  Medication Sig Dispense Refill  . acetaminophen (TYLENOL) 325 MG tablet Take 325 mg by mouth every 6 (six) hours as needed for headache.    [name] amLODipine (NORVASC) 5 MG tablet Take 1 tablet (5 mg total) by mouth daily. For blood pressure. 30 tablet 0  . atorvastatin (LIPITOR) 40 MG tablet Take 1 tablet (40 mg total) by mouth at bedtime.    . calcium-vitamin D (OSCAL WITH D) 500-200 MG-UNIT per tablet Take 1 tablet by mouth daily.     . clopidogrel (PLAVIX) 75 MG tablet Take 1 tablet (75 mg total) by mouth daily.    . Cranberry 1000 MG CAPS Take 1,000 mg by mouth daily.     . ESBRIET 267 MG CAPS TAKE 3 CAPSULES BY MOUTH THREE TIMES DAILY WITH FOOD (Patient taking differently: Take 801 mg by mouth 3 (three) times daily. ) 270 capsule 3  . levothyroxine (SYNTHROID, LEVOTHROID) 88 MCG tablet TAKE 1 TABLET BY MOUTH EVERY DAY (Patient taking differently: Take 88 mcg by mouth daily before  breakfast. ) 90 tablet 1  . loratadine (CLARITIN) 10 MG tablet Take 10 mg by mouth daily.     . metoprolol succinate (TOPROL-XL) 25 MG 24 hr tablet TAKE 1/2 TABLET BY MOUTH EVERY DAY (Patient taking differently: Take 12.5 mg by mouth daily. ) 45 tablet 1  . Multiple Vitamin (MULTIVITAMIN WITH MINERALS) TABS Take 1 tablet by mouth daily before breakfast. **Centrum silver**    . omeprazole (PRILOSEC) 20 MG capsule TAKE 1 CAPSULE (20 MG TOTAL) BY MOUTH DAILY. (Patient taking differently: Take 20 mg by mouth daily before breakfast. ) 90 capsule 1   No current facility-administered medications  on file prior to visit.     Allergies:   Allergies  Allergen Reactions  . Dilaudid [Hydromorphone] Nausea And Vomiting  . Crestor [Rosuvastatin Calcium] Other (See Comments)    Myalgias, muscle weakness    Physical Exam General: well developed, well nourished elderly Caucasian lady, seated, in no evident distress Head: head normocephalic and atraumatic.   Neck: supple with no carotid or supraclavicular bruits Cardiovascular: regular rate and rhythm, soft ejection systolic murmur. Musculoskeletal: no deformity Skin:  no rash/petichiae Vascular:  Normal pulses all extremities  Neurologic Exam Mental Status: Awake and fully alert. Oriented to place and time. Recent and remote memory intact. Attention span, concentration and fund of knowledge appropriate. Mood and affect appropriate.  Cranial Nerves: Fundoscopic exam reveals sharp disc margins. Pupils equal, briskly reactive to light. Extraocular movements full without nystagmus. Visual fields full to confrontation. Hearing intact. Facial sensation intact. Face, tongue, palate moves normally and symmetrically.  Motor: Normal bulk and tone. Normal strength in all tested extremity muscles. Sensory.: intact to touch , pinprick , position and vibratory sensation.  Coordination: Rapid alternating movements normal in all extremities. Finger-to-nose and  heel-to-shin performed accurately bilaterally. Gait and Station: Arises from chair without difficulty. Stance is normal. Gait demonstrates normal stride length and balance . Able to heel, toe and tandem walk with mild difficulty.  Reflexes: 1+ and symmetric. Toes downgoing.      ASSESSMENT: 77 year old Caucasian lady with recurrent transient episodes of bilateral leg weakness of unclear etiology.  Extensive evaluation in the past for compressive etiology and neurovascular work-up has been negative.     PLAN: I had a long d/w patient and her sister about her recent lacunar stroke and recurrent transient bilateral leg weakness, risk for recurrent stroke/TIAs, personally independently reviewed imaging studies and stroke evaluation results and answered questions.Continue Plavix  for secondary stroke prevention and maintain strict control of hypertension with blood pressure goal below 130/90, diabetes with hemoglobin A1c goal below 6.5% and lipids with LDL cholesterol goal below 70 mg/dL. I also advised the patient to eat a healthy diet with plenty of whole grains, cereals, fruits and vegetables, exercise regularly and maintain ideal body weight . CheclkNCV/EMG study and muscle enzymes and ESR to check for neuromuscular causes of leg weakness.greater than 50% time during this 45-minute consultation visit was spent on counseling and coordination of care about her episodes of recurrent leg weakness and discussion about TIA and stroke prevention.  Followup in the future with my nurse practitioner Janett Billow in 3 months or call earlier if necessary. Antony Contras, MD Medical Director [facility] Pager: [phone] 05/12/2018 5:52 PM  Note: This document was prepared with digital dictation and possible smart phrase technology. Any transcriptional errors that result from this process are unintentional.

## 2018-05-13 ENCOUNTER — Ambulatory Visit: Payer: Medicare Other | Admitting: Primary Care

## 2018-05-13 ENCOUNTER — Encounter: Payer: Self-pay | Admitting: Primary Care

## 2018-05-13 VITALS — BP 120/72 | HR 88 | Temp 98.1°F | Ht 62.0 in | Wt 127.0 lb

## 2018-05-13 DIAGNOSIS — I10 Essential (primary) hypertension: Secondary | ICD-10-CM | POA: Diagnosis not present

## 2018-05-13 DIAGNOSIS — I6300 Cerebral infarction due to thrombosis of unspecified precerebral artery: Secondary | ICD-10-CM

## 2018-05-13 DIAGNOSIS — E041 Nontoxic single thyroid nodule: Secondary | ICD-10-CM

## 2018-05-13 LAB — SEDIMENTATION RATE: Sed Rate: 10 mm/hr (ref 0–40)

## 2018-05-13 LAB — LACTATE DEHYDROGENASE: LDH: 223 IU/L (ref 119–226)

## 2018-05-13 LAB — CK: Total CK: 76 U/L (ref 24–173)

## 2018-05-13 LAB — ALDOLASE: Aldolase: 2.9 U/L — ABNORMAL LOW (ref 3.3–10.3)

## 2018-05-13 MED ORDER — ATORVASTATIN CALCIUM 40 MG PO TABS: 40.0000 mg | ORAL_TABLET | Freq: Every day | ORAL | 3 refills | Status: DC

## 2018-05-13 MED ORDER — LEVOTHYROXINE SODIUM 88 MCG PO TABS: 88.0000 ug | ORAL_TABLET | Freq: Every day | ORAL | 1 refills | Status: DC

## 2018-05-13 MED ORDER — CLOPIDOGREL BISULFATE 75 MG PO TABS: 75.0000 mg | ORAL_TABLET | Freq: Every day | ORAL | 3 refills | Status: DC

## 2018-05-13 NOTE — Assessment & Plan Note (Signed)
She will be meeting with Dr. Harlow Asa next week for evaluation of her thyroid nodule.

## 2018-05-13 NOTE — Assessment & Plan Note (Signed)
Improved and is compliant to amlodipine 5 mg and metoprolol succinate 12.5 mg. Continue same.

## 2018-05-13 NOTE — Assessment & Plan Note (Signed)
Lacunar stroke. Admitted and treated on 04/918. Exam today stable, she seems to be doing very well.  Compliant to clopidogrel and atorvastatin. BP is under good control.  Will repeat lipids in 2 weeks.  Follow up with neurology as scheduled. Hospital notes, labs, imaging reviewed.

## 2018-05-13 NOTE — Patient Instructions (Signed)
Continue taking all of your medications as prescribed.  Follow up with cardiology and Dr. Harlow Asa as scheduled.  Schedule a lab only appointment for 2 weeks to return for cholesterol check. Make sure to fast 4 hours prior.  It was a pleasure to see you today!

## 2018-05-13 NOTE — Progress Notes (Signed)
Subjective:    Patient ID: Tina Calderon, female    DOB: 1941-01-24, 77 y.o.   MRN: [id number]  HPI  Tina Calderon is a 77 year old female with a history of recent lacunar stroke and recurrent transient bilateral lower extremity weakness, hypertension who presents today for follow up of hypertension.  She was last evaluated on 04/17/18 with reports of daily headaches and elevated blood pressure readings. Her metoprolol succinate 25 mg was continued and amlodipine 5 mg was added. She was asked to follow up for re-evaluation.  Since her last visit she was admitted and treated for acute lacunar stroke. Admitted to [facility] hospital from 04/18/18 and discharged to [facility] on 04/21/18. Discharged from [facility] on November 26th.  She followed up with her neurologist yesterday who recommended BP goal below 130/90, LDL goal below 70. He continued Plavix for stroke prevention. She was initiated on atorvastatin 40 mg and has been compliant.  She is due for re-evaluation in 3 months. She will undergo NCV/EMG study in mid January 2020.  BP Readings from Last 3 Encounters:  05/13/18 120/72  05/12/18 125/74  04/21/18 127/73   She is currently being treated with home health PT/SLP twice weekly. Overall doing well, continues to feel weakness but is getting better. Doing well with speech, has to sometimes think about her thoughts. She denies new symptoms of numbness/tingling, weakness, chest pain, dizziness.   Review of Systems  Eyes: Negative for visual disturbance.  Respiratory: Negative for shortness of breath.   Cardiovascular: Negative for chest pain.  Neurological: Positive for weakness. Negative for dizziness and headaches.       Past Medical History:  Diagnosis Date  . Anginal pain ([place])   . Cancer ([place])   . CHF (congestive heart failure) ([place]) 01/22/2012  . Chronic fatigue 07/15/2014  . CVA (cerebral vascular accident) ([place]) 04/18/2018  . GERD (gastroesophageal reflux disease)  05/12/2017  . Hypertension   . Hypothyroidism   . ILD (interstitial lung disease) ([place]) 02/04/2012  . IPF (idiopathic pulmonary fibrosis) ([place]) 2013  . Mild intermittent asthma 01/29/2016  . Pelvic relaxation   . Prolapse of bladder   . Shortness of breath   . Thyroid disease      Social History   Socioeconomic History  . Marital status: Married    Spouse name: Not on file  . Number of children: Not on file  . Years of education: Not on file  . Highest education level: Not on file  Occupational History  . Not on file  Social Needs  . Financial resource strain: Not on file  . Food insecurity:    Worry: Not on file    Inability: Not on file  . Transportation needs:    Medical: Not on file    Non-medical: Not on file  Tobacco Use  . Smoking status: Never Smoker  . Smokeless tobacco: Never Used  Substance and Sexual Activity  . Alcohol use: No  . Drug use: No  . Sexual activity: Never    Birth control/protection: Post-menopausal, Surgical    Comment: HYST  Lifestyle  . Physical activity:    Days per week: Not on file    Minutes per session: Not on file  . Stress: Not on file  Relationships  . Social connections:    Talks on phone: Not on file    Gets together: Not on file    Attends religious service: Not on file    Active member of club  or organization: Not on file    Attends meetings of clubs or organizations: Not on file    Relationship status: Not on file  . Intimate partner violence:    Fear of current or ex partner: Not on file    Emotionally abused: Not on file    Physically abused: Not on file    Forced sexual activity: Not on file  Other Topics Concern  . Not on file  Social History Narrative   Married.   2 children, 5 grandchildren.   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   Enjoys puzzles, sewing, traveling, reading.     Past Surgical History:  Procedure Laterality Date  . ANTERIOR AND POSTERIOR REPAIR  12/05/2011   Procedure: ANTERIOR (CYSTOCELE)  AND POSTERIOR REPAIR (RECTOCELE);  Surgeon: [name], MD;  Location: [facility];  Service: Gynecology;  Laterality: N/A;  with cysto  . Bladder tact  15 years ago  . LEFT HEART CATHETERIZATION WITH CORONARY ANGIOGRAM N/A 01/22/2012   Procedure: LEFT HEART CATHETERIZATION WITH CORONARY ANGIOGRAM;  Surgeon: [name], MD;  Location: [facility] CATH LAB;  Service: Cardiovascular;  Laterality: N/A;  . NECK SURGERY  2010  . SHOULDER SURGERY    . thyroid disease    . TONSILLECTOMY    . VAGINAL HYSTERECTOMY  15 years ago    Family History  Problem Relation Age of Onset  . Thyroid disease Mother   . Hypertension Mother   . Coronary artery disease Mother   . Dementia Mother   . Stroke Mother   . Aneurysm Father   . Pulmonary fibrosis Sister   . Stroke Other   . Sleep apnea Other   . Cancer Brother        LIVER  . Pulmonary fibrosis Brother     Allergies  Allergen Reactions  . Dilaudid [Hydromorphone] Nausea And Vomiting  . Crestor [Rosuvastatin Calcium] Other (See Comments)    Myalgias, muscle weakness    Current Outpatient Medications on File Prior to Visit  Medication Sig Dispense Refill  . acetaminophen (TYLENOL) 325 MG tablet Take 325 mg by mouth every 6 (six) hours as needed for headache.    [name] amLODipine (NORVASC) 5 MG tablet Take 1 tablet (5 mg total) by mouth daily. For blood pressure. 30 tablet 0  . calcium-vitamin D (OSCAL WITH D) 500-200 MG-UNIT per tablet Take 1 tablet by mouth daily.     . Cranberry 1000 MG CAPS Take 1,000 mg by mouth daily.     . ESBRIET 267 MG CAPS TAKE 3 CAPSULES BY MOUTH THREE TIMES DAILY WITH FOOD (Patient taking differently: Take 801 mg by mouth 3 (three) times daily. ) 270 capsule 3  . levothyroxine (SYNTHROID, LEVOTHROID) 88 MCG tablet TAKE 1 TABLET BY MOUTH EVERY DAY (Patient taking differently: Take 88 mcg by mouth daily before breakfast. ) 90 tablet 1  . loratadine (CLARITIN) 10 MG tablet Take 10 mg by mouth daily.     . metoprolol succinate  (TOPROL-XL) 25 MG 24 hr tablet TAKE 1/2 TABLET BY MOUTH EVERY DAY (Patient taking differently: Take 12.5 mg by mouth daily. ) 45 tablet 1  . Multiple Vitamin (MULTIVITAMIN WITH MINERALS) TABS Take 1 tablet by mouth daily before breakfast. **Centrum silver**    . omeprazole (PRILOSEC) 20 MG capsule TAKE 1 CAPSULE (20 MG TOTAL) BY MOUTH DAILY. (Patient taking differently: Take 20 mg by mouth daily before breakfast. ) 90 capsule 1   No current facility-administered medications on file prior to visit.  BP 120/72   Pulse 88   Temp 98.1 F (36.7 C) (Oral)   Ht _0  (1.575 m)   Wt 127 lb (57.6 kg)   SpO2 97%   BMI 23.23 kg/m    Objective:   Physical Exam  Constitutional: She is oriented to person, place, and time. She appears well-nourished.  Eyes: EOM are normal.  Neck: Neck supple.  Cardiovascular: Normal rate and regular rhythm.  Respiratory: Effort normal and breath sounds normal.  Musculoskeletal:  4/4 strength to bilateral upper and lower extremities.   Neurological: She is alert and oriented to person, place, and time. No cranial nerve deficit. Coordination normal.  Speech clear, no pauses in between words or sentences   Skin: Skin is warm and dry.  Psychiatric: She has a normal mood and affect.           Assessment & Plan:

## 2018-05-14 ENCOUNTER — Ambulatory Visit: Payer: Medicare Other | Admitting: Neurology

## 2018-05-17 ENCOUNTER — Other Ambulatory Visit: Payer: Self-pay | Admitting: Primary Care

## 2018-05-17 DIAGNOSIS — I10 Essential (primary) hypertension: Secondary | ICD-10-CM

## 2018-05-18 ENCOUNTER — Encounter: Payer: Self-pay | Admitting: Cardiology

## 2018-05-18 ENCOUNTER — Ambulatory Visit: Payer: Medicare Other | Admitting: Cardiology

## 2018-05-18 VITALS — BP 122/70 | HR 90 | Ht 62.5 in | Wt 126.0 lb

## 2018-05-18 DIAGNOSIS — Z7189 Other specified counseling: Secondary | ICD-10-CM | POA: Diagnosis not present

## 2018-05-18 DIAGNOSIS — R5382 Chronic fatigue, unspecified: Secondary | ICD-10-CM

## 2018-05-18 DIAGNOSIS — I10 Essential (primary) hypertension: Secondary | ICD-10-CM

## 2018-05-18 DIAGNOSIS — E785 Hyperlipidemia, unspecified: Secondary | ICD-10-CM

## 2018-05-18 DIAGNOSIS — R0602 Shortness of breath: Secondary | ICD-10-CM | POA: Diagnosis not present

## 2018-05-18 NOTE — Patient Instructions (Signed)
Medication Instructions:  Continue same medications If you need a refill on your cardiac medications before your next appointment, please call your pharmacy.   Lab work: None ordered   Testing/Procedures: None ordered  Follow-Up: At [facility], you and your health needs are our priority.  As part of our continuing mission to provide you with exceptional heart care, we have created designated Provider Care Teams.  These Care Teams include your primary Cardiologist (physician) and Advanced Practice Providers (APPs -  Physician Assistants and Nurse Practitioners) who all work together to provide you with the care you need, when you need it. . Follow Up appointment with Dr.Christopher in 6 months  Call 2 months before to schedule

## 2018-05-18 NOTE — Progress Notes (Signed)
Cardiology Office Note:    Date:  05/18/2018   ID:  Tina Calderon, DOB Dec 03, 1940, MRN [id number]  PCP:  [name], NP  Cardiologist:  Buford Dresser, MD PhD  Referring MD: [name], NP   CC: Follow up  History of Present Illness:    Tina Calderon is a 77 y.o. female with a hx of pulmonary fibrosis who is seen in follow up for evaluation and management of tachycardia, fatigue, exertional intolerance.  Summary from initial visit 01/22/18: She presented to [facility] ER on 01/12/18 with palpitations and shortness of breath. At pulmonary rehab that morning, her heart rate was noted to be 120 bpm (patient states this was at rest). In the ER, her ECG showed sinus tachycardia at 104 bpm, CT was negative for PE, negative troponin. She feels exhausted with basic everyday activities. For instance, she cooks breakfast every morning for herself and her husband. By the time she is done, she has no appetite. Often she has to lie down to rest for a time after. She is supposed to do about 50 minutes of near continuous cardiovascular activity at pulmonary rehab, and she is completely exhausted by the end of this. She endorses good food and fluid intake despite her fatigue. No infectious symptoms or recent illnesses.  She has had a difficulty few months. She was admitted in 01/2018 with weakness and falls, and then since the last visit, she was discharged from the hospital after a stroke on 04/21/18. She went to a rehab facility and has only been home about a week.   Overall feels tired and fatigued. Has been doing physical therapy to regain strength both in the facility and now at home, with gradual improvement. We reviewed her recent stroke workup and medications. We also reviewed her cardiac workup.   No chest pain, stable shortness of breath, no PND or orthopnea. No syncope or palpitations.  Past Medical History:  Diagnosis Date  . Anginal pain ([place])   . Cancer ([place])   . CHF  (congestive heart failure) ([place]) 01/22/2012  . Chronic fatigue 07/15/2014  . CVA (cerebral vascular accident) ([place]) 04/18/2018  . GERD (gastroesophageal reflux disease) 05/12/2017  . Hypertension   . Hypothyroidism   . ILD (interstitial lung disease) ([place]) 02/04/2012  . IPF (idiopathic pulmonary fibrosis) ([place]) 2013  . Mild intermittent asthma 01/29/2016  . Pelvic relaxation   . Prolapse of bladder   . Shortness of breath   . Thyroid disease     Past Surgical History:  Procedure Laterality Date  . ANTERIOR AND POSTERIOR REPAIR  12/05/2011   Procedure: ANTERIOR (CYSTOCELE) AND POSTERIOR REPAIR (RECTOCELE);  Surgeon: [name], MD;  Location: [facility];  Service: Gynecology;  Laterality: N/A;  with cysto  . Bladder tact  15 years ago  . LEFT HEART CATHETERIZATION WITH CORONARY ANGIOGRAM N/A 01/22/2012   Procedure: LEFT HEART CATHETERIZATION WITH CORONARY ANGIOGRAM;  Surgeon: [name], MD;  Location: [facility] CATH LAB;  Service: Cardiovascular;  Laterality: N/A;  . NECK SURGERY  2010  . SHOULDER SURGERY    . thyroid disease    . TONSILLECTOMY    . VAGINAL HYSTERECTOMY  15 years ago    Current Medications: Current Outpatient Medications on File Prior to Visit  Medication Sig  . acetaminophen (TYLENOL) 325 MG tablet Take 325 mg by mouth every 6 (six) hours as needed for headache.  [name] atorvastatin (LIPITOR) 40 MG tablet Take 1 tablet (40 mg total) by mouth at bedtime.  For cholesterol.  . calcium-vitamin D (OSCAL WITH D) 500-200 MG-UNIT per tablet Take 1 tablet by mouth daily.   . clopidogrel (PLAVIX) 75 MG tablet Take 1 tablet (75 mg total) by mouth daily. For stroke prevention.  . Cranberry 1000 MG CAPS Take 1,000 mg by mouth daily.   . ESBRIET 267 MG CAPS TAKE 3 CAPSULES BY MOUTH THREE TIMES DAILY WITH FOOD (Patient taking differently: Take 801 mg by mouth 3 (three) times daily. )  . levothyroxine (SYNTHROID, LEVOTHROID) 88 MCG tablet Take 1 tablet (88 mcg total) by mouth daily before  breakfast.  . loratadine (CLARITIN) 10 MG tablet Take 10 mg by mouth daily.   . metoprolol succinate (TOPROL-XL) 25 MG 24 hr tablet TAKE 1/2 TABLET BY MOUTH EVERY DAY (Patient taking differently: Take 12.5 mg by mouth daily. )  . Multiple Vitamin (MULTIVITAMIN WITH MINERALS) TABS Take 1 tablet by mouth daily before breakfast. **Centrum silver**  . omeprazole (PRILOSEC) 20 MG capsule TAKE 1 CAPSULE (20 MG TOTAL) BY MOUTH DAILY. (Patient taking differently: Take 20 mg by mouth daily before breakfast. )  . amLODipine (NORVASC) 5 MG tablet TAKE 1 TABLET BY MOUTH EVERY DAY FOR BLOOD PRESSURE   No current facility-administered medications on file prior to visit.      Allergies:   Dilaudid [hydromorphone] and Crestor [rosuvastatin calcium]   Social History   Socioeconomic History  . Marital status: Married    Spouse name: Not on file  . Number of children: Not on file  . Years of education: Not on file  . Highest education level: Not on file  Occupational History  . Not on file  Social Needs  . Financial resource strain: Not on file  . Food insecurity:    Worry: Not on file    Inability: Not on file  . Transportation needs:    Medical: Not on file    Non-medical: Not on file  Tobacco Use  . Smoking status: Never Smoker  . Smokeless tobacco: Never Used  Substance and Sexual Activity  . Alcohol use: No  . Drug use: No  . Sexual activity: Never    Birth control/protection: Post-menopausal, Surgical    Comment: HYST  Lifestyle  . Physical activity:    Days per week: Not on file    Minutes per session: Not on file  . Stress: Not on file  Relationships  . Social connections:    Talks on phone: Not on file    Gets together: Not on file    Attends religious service: Not on file    Active member of club or organization: Not on file    Attends meetings of clubs or organizations: Not on file    Relationship status: Not on file  Other Topics Concern  . Not on file  Social History  Narrative   Married.   2 children, 5 grandchildren.   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   Enjoys puzzles, sewing, traveling, reading.      Family History: The patient's family history includes Aneurysm in her father; Cancer in her brother; Coronary artery disease in her mother; Dementia in her mother; Hypertension in her mother; Pulmonary fibrosis in her brother and sister; Sleep apnea in her other; Stroke in her mother and other; Thyroid disease in her mother.  ROS:   Please see the history of present illness.  Additional pertinent ROS: Review of Systems  Constitutional: Positive for malaise/fatigue. Negative for chills and fever.  HENT: Negative for ear pain  and hearing loss.   Eyes: Negative for blurred vision and pain.  Respiratory: Positive for shortness of breath. Negative for hemoptysis, sputum production and wheezing.   Cardiovascular: Negative for chest pain, palpitations, orthopnea, claudication, leg swelling and PND.  Gastrointestinal: Negative for abdominal pain and blood in stool.  Genitourinary: Negative for dysuria and hematuria.  Musculoskeletal: Negative for falls and myalgias.  Skin: Negative for rash.  Neurological: Negative for focal weakness and loss of consciousness.  Endo/Heme/Allergies: Does not bruise/bleed easily.   EKGs/Labs/Other Studies Reviewed:    The following studies were reviewed today: MPI 04/05/18  Nuclear stress EF: 68%.  Blood pressure demonstrated a normal response to exercise.  There was no ST segment deviation noted during stress.  The study is normal.  This is a low risk study.  The left ventricular ejection fraction is hyperdynamic (>65%).  Echo 04/19/18 - Left ventricle: The cavity size was normal. Wall thickness was   increased in a pattern of mild LVH. Systolic function was   vigorous. The estimated ejection fraction was in the range of 65%   to 70%. Wall motion was normal; there were no regional wall   motion  abnormalities. Doppler parameters are consistent with   abnormal left ventricular relaxation (grade 1 diastolic   dysfunction). Doppler parameters are consistent with   indeterminate ventricular filling pressure. - Aortic valve: Mildly calcified annulus. Trileaflet. - Mitral valve: Mildly calcified annulus. - Left atrium: The atrium was mildly dilated. - Atrial septum: No defect or patent foramen ovale was identified. - Tricuspid valve: There was moderate regurgitation. - Pulmonary arteries: PA peak pressure: 63 mm Hg (S). - Pericardium, extracardiac: A trivial pericardial effusion was   identified.  Echo 01/31/18 Study Conclusions  - Left ventricle: The cavity size was normal. Wall thickness was   normal. Systolic function was normal. The estimated ejection   fraction was in the range of 60% to 65%. Wall motion was normal;   there were no regional wall motion abnormalities. Doppler   parameters are consistent with abnormal left ventricular   relaxation (grade 1 diastolic dysfunction). - Aortic valve: Mildly calcified annulus. Normal thickness   leaflets. Valve area (VTI): 1.83 cm^2. Valve area (Vmax): 2.06   cm^2. Valve area (Vmean): 1.74 cm^2. - Mitral valve: Mildly calcified annulus. Normal thickness leaflets   . - Left atrium: The atrium was severely dilated. - Right ventricle: The cavity size was mildly dilated. - Technically adequate study.  Elevated RA pressures, RV-RA gradient 47 mmHg.   Monitor 01/29/18 Findings Ventricular tachycardia-nonsustained-up to 5 beats Atrial tachycardia-37 episodes      Symptoms of fluttering sinus rhythm;                              rare PAC                             Isolated PVC Symptoms of shortness of breath PACs/PVCs  Long term monitor reviewed. All of her patient triggered events are sinus rhythm, occasionally tachycardia, rare isolated ectopy. She had one episode of 5 beats of NSVT without symptoms, and she had one brief  episode of 3 beats of SVT. Rest are isolated atrial ectopy and rare PVCs.  CT angio chest 01/12/18 IMPRESSION: 1. No acute findings. No pulmonary embolism seen. No evidence of pneumonia or pulmonary edema. 2. Continued evidence of chronic interstitial lung disease (NSIP), not significantly  changed compared to the previous chest CT given the slightly lower lung volumes. 3. Large hiatal hernia, stable. 4. Cardiomegaly. 5. Coronary artery calcifications.  EKG:  EKG personally reviewed, most recent is 04/18/18 with SR, PRWP  Recent Labs: 03/15/2018: TSH 1.593 04/18/2018: ALT 12; B Natriuretic Peptide 100.9; Hemoglobin 12.6; Magnesium 1.8; Platelets 205 04/20/2018: BUN 10; Creatinine, Ser 0.81; Potassium 3.8; Sodium 138  Recent Lipid Panel    Component Value Date/Time   CHOL 212 (H) 04/19/2018 0551   TRIG 190 (H) 04/19/2018 0551   HDL 42 04/19/2018 0551   CHOLHDL 5.0 04/19/2018 0551   VLDL 38 04/19/2018 0551   LDLCALC 132 (H) 04/19/2018 0551    Physical Exam:    VS:  BP 122/70 (BP Location: Right Arm, Patient Position: Sitting, Cuff Size: Normal)   Pulse 90   Ht 5' 2.5" (1.588 m)   Wt 126 lb (57.2 kg)   SpO2 97%   BMI 22.68 kg/m     Wt Readings from Last 3 Encounters:  05/18/18 126 lb (57.2 kg)  05/13/18 127 lb (57.6 kg)  05/12/18 126 lb (57.2 kg)     GEN: Well nourished, well developed in no acute distress HEENT: Normal NECK: No JVD; No carotid bruits LYMPHATICS: No lymphadenopathy CARDIAC: regular rhythm, normal S1 and S2, no murmurs, rubs, gallops. Radial and DP pulses 2+ bilaterally. RESPIRATORY:   No consolidation. No tight wheezing but upper airways with inspiratory rhonchi. Faint crackles throughout upper fields. ABDOMEN: Soft, non-tender, non-distended MUSCULOSKELETAL:  No edema; No deformity  SKIN: Warm and dry NEUROLOGIC:  Alert and oriented x 3 PSYCHIATRIC:  Normal affect   ASSESSMENT:    1. Shortness of breath   2. Chronic fatigue   3. Counseling on  health promotion and disease prevention   4. Essential hypertension   5. Dyslipidemia, goal LDL below 70    PLAN:    1. Shortness of breath, severe fatigue with exertion:she has had echo, event monitor, and nuclear stress test, all without cardiac cause for her symptoms. She does have elevated pulmonary pressures, likely secondary to her lung disease. She has had several difficult months with her symptoms, including a recent stroke.  -she is on clopidogrel and atorvastatin given her recent stroke (complete post stroke aspirin). This will also work for CAD prevention -she is on metoprolol for tachycardia, and it does not appear to have worsened her shortness of breath -her blood pressure is well controlled on amlodipine  2. Secondary prevention: -recommend heart healthy/Mediterranean diet, with whole grains, fruits, vegetable, fish, lean meats, nuts, and olive oil. Limit salt. -recommend moderate walking, 3-5 times/week for 30-50 minutes each session. Aim for at least 150 minutes.week. Goal should be pace of 3 miles/hours, or walking 1.5 miles in 30 minutes -recommend avoidance of tobacco products. Avoid excess alcohol. -Additional risk factor control:  -Diabetes: A1c is 5.6  -Dyslipidemia: recently changed to atorvastatin, pending repeat panel after appropriate treatment time. Goal LDL <70.  -Hypertension: BP at goal  -Weight: BMI 22. -ASCVD risk score: N/A, with history of stroke is secondary prevention  Plan for follow up: 6 mos or sooner PRN  TIME SPENT WITH PATIENT: >40 minutes of direct patient care. More than 50% of that time was spent on coordination of care and counseling regarding shortness of breath, fatigue, cardiac workup, secondary prevention given recent stroke.  Buford Dresser, MD, PhD [facility]  [facility]   Medication Adjustments/Labs and Tests Ordered: Current medicines are reviewed at length with the patient today.  Concerns regarding medicines are  outlined above.  No orders of the defined types were placed in this encounter.  No orders of the defined types were placed in this encounter.   Patient Instructions  Medication Instructions:  Continue same medications If you need a refill on your cardiac medications before your next appointment, please call your pharmacy.   Lab work: None ordered   Testing/Procedures: None ordered  Follow-Up: At [facility], you and your health needs are our priority.  As part of our continuing mission to provide you with exceptional heart care, we have created designated Provider Care Teams.  These Care Teams include your primary Cardiologist (physician) and Advanced Practice Providers (APPs -  Physician Assistants and Nurse Practitioners) who all work together to provide you with the care you need, when you need it. . Follow Up appointment with Dr.Tyeshia Cornforth in 6 months  Call 2 months before to schedule      Signed, Buford Dresser, MD PhD 05/18/2018 10:09 AM    [place]

## 2018-05-19 ENCOUNTER — Encounter: Payer: Self-pay | Admitting: Cardiology

## 2018-05-22 ENCOUNTER — Other Ambulatory Visit: Payer: Self-pay | Admitting: Primary Care

## 2018-05-22 DIAGNOSIS — E785 Hyperlipidemia, unspecified: Secondary | ICD-10-CM

## 2018-05-27 ENCOUNTER — Other Ambulatory Visit (INDEPENDENT_AMBULATORY_CARE_PROVIDER_SITE_OTHER): Payer: Medicare Other

## 2018-05-27 DIAGNOSIS — E785 Hyperlipidemia, unspecified: Secondary | ICD-10-CM | POA: Diagnosis not present

## 2018-05-27 LAB — LIPID PANEL
Cholesterol: 135 mg/dL (ref 0–200)
HDL: 62 mg/dL (ref >39.00)
LDL Cholesterol: 54 mg/dL (ref 0–99)
NonHDL: 72.51
Total CHOL/HDL Ratio: 2
Triglycerides: 91 mg/dL (ref 0.0–149.0)
VLDL: 18.2 mg/dL (ref 0.0–40.0)

## 2018-05-27 LAB — HEPATIC FUNCTION PANEL
ALK PHOS: 64 U/L (ref 39–117)
ALT: 12 U/L (ref 0–35)
AST: 17 U/L (ref 0–37)
Albumin: 4.2 g/dL (ref 3.5–5.2)
Bilirubin, Direct: 0.1 mg/dL (ref 0.0–0.3)
Total Bilirubin: 0.4 mg/dL (ref 0.2–1.2)
Total Protein: 7 g/dL (ref 6.0–8.3)

## 2018-06-01 ENCOUNTER — Telehealth: Payer: Self-pay

## 2018-06-01 NOTE — Telephone Encounter (Signed)
Clearance form to Conni Slipper  RN at [phone] twice and confirmed via fax.  Clearance form fax to [facility] at [phone] twice and confirmed.

## 2018-06-01 NOTE — Telephone Encounter (Signed)
Per review of notes, patient has a 2.3 cm nodule in the inferior left thyroid lobe and genetic testing suspicious for malignancy, given her risk of cancer approximately 50%.  They are requesting proceeding with left thyroid lobectomy for definitive diagnosis and management.  Please provide letter to [facility] [place] [facility] stating that it is typically recommended for patients to wait 3 to 6 months post stroke prior to undergoing any type of procedure or surgery. Due to her high risk for malignancy, patient is cleared to undergo surgical procedure as benefit of procedure outweighs risk of possible recurrent stroke while off her Plavix.  It is recommended for patient to discontinue Plavix 3 days prior to procedure and start immediately after once hemodynamically stable.  Patient needs to be aware that there is a risk of recurrent stroke while off her Plavix especially as her stroke was on 04/18/2018 but due to need of this procedure, benefit of procedure outweighs risk of recurrent stroke.

## 2018-06-01 NOTE — Telephone Encounter (Signed)
Clearance form fax to [facility] at [phone]. Form fax twice and confirmed.

## 2018-06-01 NOTE — Telephone Encounter (Signed)
Clearance form given to [facility] NP in her in basket for review.

## 2018-06-01 NOTE — Telephone Encounter (Signed)
Rn call [facility] and requested to speak with Dr. Tera Helper nurse. She was not available. Rn spoke with Lanny Cramp in the triage section. Rn stated clearance form had some recommendations per neurology and there office needs to make pt aware. Rn stated pt had stroke in 04/2018 but usually we wait 6 months but pt needs a biopsy done that is emergent. Abigail Butts ask that clearance form be fax to her Attention Conni Slipper at [phone]. Form fax twice and confirmed.

## 2018-06-02 ENCOUNTER — Telehealth: Payer: Self-pay | Admitting: *Deleted

## 2018-06-02 NOTE — Telephone Encounter (Signed)
   Primary Cardiologist: Buford Dresser, MD  Chart reviewed as part of pre-operative protocol coverage. Ms. Christoph was recently hospitalized in November in the setting of an acute enonhemorrhagic left centrum semiovale infarct.  In that setting, she has been on plavix therapy.  From a cardiovascular standpoint, she has chronic dyspnea but has undergone stress testing (04/01/2018 - nonischemic) and echocardiography (04/19/2018 - nl EF, diast dysfxn, no significant valvular dzs, Pulm HTN w/ PASP of 30mHg). Given past medical history and time since last visit, based on ACC/AHA guidelines, NLINDALEE HUIZINGAwould be at acceptable risk for the planned procedure without further cardiovascular testing, however, close attention will need to paid to her volume status throughout the perioperative period.   blocker and statin therapy should also be continued throughout the perioperative period.  Given recent stroke, would recommend neurology clearance, especially in the setting of plavix therapy initiated post-stroke.  Please call with questions.  CMurray Hodgkins NP 06/02/2018, 3:50 PM

## 2018-06-02 NOTE — Telephone Encounter (Signed)
   [facility] Pre-operative Risk Assessment    Request for surgical clearance:  1. What type of surgery is being performed? Thyroid surgery   2. When is this surgery scheduled? TBD   3. What type of clearance is required (medical clearance vs. Pharmacy clearance to hold med vs. Both)? both  4. Are there any medications that need to be held prior to surgery and how long?plavix   5. Practice name and name of physician performing surgery? Todd gerkin md   6. What is your office phone number [phone]    7.   What is your office fax number [phone]  8.   Anesthesia type (None, local, MAC, general) ? general   Tina Calderon 06/02/2018, 7:34 AM  _________________________________________________________________   (provider comments below)

## 2018-06-18 ENCOUNTER — Encounter: Payer: Self-pay | Admitting: Emergency Medicine

## 2018-06-18 ENCOUNTER — Ambulatory Visit: Payer: Medicare Other | Admitting: Emergency Medicine

## 2018-06-18 ENCOUNTER — Encounter: Payer: Self-pay | Admitting: *Deleted

## 2018-06-18 DIAGNOSIS — J84112 Idiopathic pulmonary fibrosis: Secondary | ICD-10-CM | POA: Diagnosis not present

## 2018-06-18 DIAGNOSIS — K219 Gastro-esophageal reflux disease without esophagitis: Secondary | ICD-10-CM | POA: Diagnosis not present

## 2018-06-18 DIAGNOSIS — J4520 Mild intermittent asthma, uncomplicated: Secondary | ICD-10-CM | POA: Diagnosis not present

## 2018-06-18 DIAGNOSIS — J301 Allergic rhinitis due to pollen: Secondary | ICD-10-CM | POA: Diagnosis not present

## 2018-06-18 NOTE — Assessment & Plan Note (Signed)
Continue omeprazole

## 2018-06-18 NOTE — Assessment & Plan Note (Signed)
Currently asymptomatic.  She does not use bronchodilators.  She seems to benefit the most from controlling her allergic rhinitis

## 2018-06-18 NOTE — Progress Notes (Signed)
Subjective:    Patient ID: Tina Calderon, female    DOB: 02-29-1941, 78 y.o.   MRN: [id number]  HPI  ROV 12/01/17 --this follow-up visit for patient with a history of idiopathic interstitial lung disease, some coexisting obstruction based on her spirometry.  She also has a hiatal hernia with GERD and chronic cough.  We have managed her on pirfenidone.  At our last visit she noted that she is had some decreased functional capacity, more dyspnea with exertion. She is still having decreased energy, more fatigue with activity. We checked CBC and TSH, both reassuring. She keeps a slight cough, added chlorpheniramine to loratadine. Remains on omeprazole.    She underwent a 6-minute walk test today and was able to walk 462 m without desaturation.  Her last in 04/2016 was 469mRepeat CT chest from 11/28/2017 reviewed and shows stable interstitial changes compared with her most recent from 2017.  There is patchy groundglass attenuation with associated septal thickening and peripheral bronchial ectasis without overt honeycomb change.  No effusion, no suspicious nodules.   ROV 06/18/18 --Tina Calderon is 89 followed for history of idiopathic interstitial lung disease and some coexisting obstruction noted on spirometry.  She also has a hiatal hernia with GERD and some associated chronic cough.  We have been managing her on pirfenidone - no side effects.  She is on omeprazole, loratadine, chlorpheniramine.  Since our last visit she was hospitalized for a stroke, has some residual word-finding trouble. She is scheduled for nerve conduction studies after some falls and leg weakness. She is also being evaluated for possible thyroid surgery with Dr. GHarlow Asa She had a CT chest done 01/12/18 that I reviewed, shows no real change in NSIP pattern, large HH, no new infiltrates. Overall reports that breathing is stable, not limiting.     SIX MIN WALK 12/01/2017 05/01/2016 10/29/2013 12/04/2012 02/21/2012  Medications Levothyroxine,  Esbriet, Loratdine, and Omeprazole all taken at 6:45 AM Synthroid 827m, Claritin 1081mPrilosec 33m39maken at 0630. Esbriet 801mg64men at 0800 - - -  Supplimental Oxygen during Test? (L/min) _0   Laps 9 9 - - -  Partial Lap (in Meters) 30 12 - - -  Baseline BP (sitting) 120/78 140/82 - - -  Baseline Heartrate 101 100 - - -  Baseline Dyspnea (Borg Scale) 1 3 - - -  Baseline Fatigue (Borg Scale) 1 2 - - -  Baseline SPO2 98 97 - - -  BP (sitting) 140/76 192/98 - - -  Heartrate 129 139 - - -  Dyspnea (Borg Scale) 3 3 - - -  Fatigue (Borg Scale) 3 4 - - -  SPO2 96 92 - - -  BP (sitting) 132/78 170/92 - - -  Heartrate 106 113 - - -  SPO2 99 98 - - -  Stopped or Paused before Six Minutes No No - - -  Interpretation Hip pain - - - -  Distance Completed 462 444 - - -  Tech Comments: patient was very anxious as well as emotional before test. Pt ambulated with a steady gait.  - pt only completed 2 laps, stated she was "very short winded"//ldc Pt completed the walk with little difficulties...increased sob with increased heart rate.        Objective:   Physical Exam Vitals:   06/18/18 1009  BP: 136/82  Pulse: 82  SpO2: 96%  Weight: 129 lb (58.5 kg)  Height: _1  (1.575 m)    GEN: A/Ox3;  pleasant , NAD, well nourished    HEENT: OP clear, no congestion  NECK:  No stridor.    RESP  bibasilar inspiratory crackles and squeaks  CARD:  RRR, no m/r/g  , no peripheral edema, pulses intact, no cyanosis or clubbing.  Musco: Warm bil, no deformities or joint swelling noted.   Neuro: alert, no focal deficits noted. She c/o word finding difficulty but no evidence for this today   Skin: Warm, no lesions or rashes       Assessment & Plan:  Mild intermittent asthma Currently asymptomatic.  She does not use bronchodilators.  She seems to benefit the most from controlling her allergic rhinitis  Allergic rhinitis Continue same chlorpheniramine, loratadine  GERD  (gastroesophageal reflux disease) Continue omeprazole  IPF (idiopathic pulmonary fibrosis) ([place]) She is tolerating pirfenidone.  Her most recent CT was done 01/12/2018 that showed stable interstitial disease without overt honeycombing change.  I do not believe there is any evidence for clinical progression either.  LFT in December 2019 were normal we will continue the pirfenidone as ordered.  I will recheck her CBC hepatic panel at her next follow-up.   Baltazar Apo, MD, PhD 06/18/2018, 10:40 AM [facility] [phone] or if no answer [phone]

## 2018-06-18 NOTE — Assessment & Plan Note (Signed)
She is tolerating pirfenidone.  Her most recent CT was done 01/12/2018 that showed stable interstitial disease without overt honeycombing change.  I do not believe there is any evidence for clinical progression either.  LFT in December 2019 were normal we will continue the pirfenidone as ordered.  I will recheck her CBC hepatic panel at her next follow-up.

## 2018-06-18 NOTE — Patient Instructions (Signed)
Please continue to take your pirfenidone 3 times a day as you have been doing.  Your most recent CT scan of the chest did not show any progression of your interstitial lung disease which is good news. Continue your omeprazole, loratadine and chlorpheniramine OTC allergy medication as you have been doing. We will send a surgical risk letter to Dr. Harlow Asa so that you can proceed with your thyroid surgery. Follow with Dr. Leonie Man as planned Follow with Dr Lamonte Sakai in 4 months or sooner if you have any problems.

## 2018-06-18 NOTE — Assessment & Plan Note (Signed)
Continue same chlorpheniramine, loratadine

## 2018-06-22 ENCOUNTER — Ambulatory Visit: Payer: Self-pay | Admitting: Surgery

## 2018-06-26 ENCOUNTER — Other Ambulatory Visit: Payer: Self-pay | Admitting: Neurology

## 2018-06-26 ENCOUNTER — Ambulatory Visit (INDEPENDENT_AMBULATORY_CARE_PROVIDER_SITE_OTHER): Payer: Medicare Other | Admitting: Neurology

## 2018-06-26 ENCOUNTER — Telehealth: Payer: Self-pay | Admitting: Neurology

## 2018-06-26 ENCOUNTER — Encounter: Payer: Medicare Other | Admitting: Neurology

## 2018-06-26 DIAGNOSIS — Z0289 Encounter for other administrative examinations: Secondary | ICD-10-CM

## 2018-06-26 DIAGNOSIS — G959 Disease of spinal cord, unspecified: Secondary | ICD-10-CM

## 2018-06-26 DIAGNOSIS — R29898 Other symptoms and signs involving the musculoskeletal system: Secondary | ICD-10-CM

## 2018-06-26 NOTE — Telephone Encounter (Signed)
EMG/NCS is normal today.  On examinations, she has bilateral upper and lower extremity brisk reflex, especially bilateral patella, left side Babinski signs.  I explained the findings to Dr. Leonie Man, patient and her daughter, will proceed with MRI of cervical spine.  Patient will have thyroid surgery on July 17, 2018, hope to have MRI cervical spine before that.

## 2018-06-26 NOTE — Telephone Encounter (Signed)
Thanks. I have ordered MRi C spine w/wo

## 2018-06-26 NOTE — Procedures (Signed)
Full Name: Tina Calderon Gender: Female MRN #: [id number] Date of Birth: 1940-08-23    Visit Date: 06/26/2018 11:37 Age: 78 Years 35 Months Old Examining Physician: Marcial Pacas, MD  Referring Physician: Leonie Man, MD History: 78 year old female with unexplained bilateral lower extremity weakness, intermittent numbness, recent involvement of bilateral fingertips.  She also complains of neck pain.  Denies significant gait abnormality bowel bladder incontinence.  On examination: Bilateral upper and lower extremity motor strength was normal.  Deep tendon reflex was brisk especially bilateral patella reflexes, with left side Babinski sign  Summary of the tests:  Nerve conduction study: Bilateral sural, superficial peroneal sensory responses were normal.  Bilateral tibial, peroneal to EDB motor responses were normal.  Electromyography: Selected needle examination of right lower extremity and right lumbosacral paraspinal muscles were normal.  Conclusion: This is a normal study.  There is no electrodiagnostic evidence of right lower extremity neuropathy or lumbosacral radiculopathy.    ------------------------------- Marcial Pacas M.D. PhD  [facility] [place], [place] [address] Tel: [phone] Fax: [phone]        [facility]    Nerve / Sites Muscle Latency Ref. Amplitude Ref. Rel Amp Segments Distance Velocity Ref. Area    ms ms mV mV %  cm m/s m/s mVms  R Peroneal - EDB     Ankle EDB 4.9 ?6.5 4.9 ?2.0 100 Ankle - EDB 9   14.6     Fib head EDB 11.3  4.6  93.7 Fib head - Ankle 30 47 ?44 13.6     Pop fossa EDB 13.1  4.8  106 Pop fossa - Fib head 10 56 ?44 16.2         Pop fossa - Ankle      L Peroneal - EDB     Ankle EDB 5.5 ?6.5 2.9 ?2.0 100 Ankle - EDB 9   8.3     Fib head EDB 13.0  2.6  90.6 Fib head - Ankle 32 43 ?44 8.5     Pop fossa EDB 15.7  0.7  24.9 Pop fossa - Fib head 10 37 ?44 1.4         Pop fossa - Ankle      R Tibial - AH     Ankle AH  4.0 ?5.8 10.0 ?4.0 100 Ankle - AH 9   30.7     Pop fossa AH 11.9  6.3  63.1 Pop fossa - Ankle 35 44 ?41 25.3  L Tibial - AH     Ankle AH 4.0 ?5.8 10.6 ?4.0 100 Ankle - AH 9   27.9     Pop fossa AH 11.6  7.5  70.9 Pop fossa - Ankle 34 45 ?41 23.3             SNC    Nerve / Sites Rec. Site Peak Lat Ref.  Amp Ref. Segments Distance    ms ms V V  cm  R Sural - Ankle (Calf)     Calf Ankle 3.2 ?4.4 9 ?6 Calf - Ankle 14  L Sural - Ankle (Calf)     Calf Ankle 3.2 ?4.4 9 ?6 Calf - Ankle 14  R Superficial peroneal - Ankle     Lat leg Ankle 3.8 ?4.4 6 ?6 Lat leg - Ankle 14  L Superficial peroneal - Ankle     Lat leg Ankle 4.0 ?4.4 7 ?6 Lat leg - Ankle 14  F  Wave    Nerve F Lat Ref.   ms ms  R Tibial - AH 52.1 ?56.0  L Tibial - AH 47.0 ?56.0         EMG full       EMG Summary Table    Spontaneous MUAP Recruitment  Muscle IA Fib PSW Fasc Other Amp Dur. Poly Pattern  R. Tibialis anterior Normal None None None _______ Normal Normal Normal Normal  R. Tibialis posterior Normal None None None _______ Normal Normal Normal Normal  R. Peroneus longus Normal None None None _______ Normal Normal Normal Normal  R. Vastus lateralis Normal None None None _______ Normal Normal Normal Normal  R. Gluteus medius Normal None None None _______ Normal Normal Normal Normal  R. Lumbar paraspinals (mid) Normal None None None _______ Normal Normal Normal Normal  R. Lumbar paraspinals (low) Normal None None None _______ Normal Normal Normal Normal

## 2018-06-29 ENCOUNTER — Telehealth: Payer: Self-pay | Admitting: Neurology

## 2018-06-29 NOTE — Telephone Encounter (Signed)
Noted.

## 2018-06-29 NOTE — Telephone Encounter (Signed)
Spoke to the patient she is aware. I gave her GI phone number of [phone] and to give them a call if she has not heard from them in the next 2-3 business days,.

## 2018-06-29 NOTE — Telephone Encounter (Signed)
[facility] Medicare order sent to GI. No auth they will reach out to the pt to schedule.

## 2018-06-30 ENCOUNTER — Telehealth: Payer: Self-pay

## 2018-06-30 NOTE — Telephone Encounter (Signed)
-----  Message from Garvin Fila, MD sent at 06/26/2018  2:24 PM EST ----- Kindly infoprm patient that EMg study was normal but due to concerns for cervical spine compression I have ordered MRI cervical spine

## 2018-06-30 NOTE — Telephone Encounter (Signed)
I called pt and advised her of the normal EMG and that Dr. Leonie Man has ordered a MRI cervical to look at possible cervical spine compression. Pt will look out for the phone call from [facility] to schedule it. Pt verbalized understanding of results. Pt had no questions at this time but was encouraged to call back if questions arise.

## 2018-07-06 ENCOUNTER — Ambulatory Visit
Admission: RE | Admit: 2018-07-06 | Discharge: 2018-07-06 | Disposition: A | Discharge status: Home / Self Care | Payer: Medicare Other | Source: Ambulatory Visit | Attending: Neurology | Admitting: Neurology

## 2018-07-06 DIAGNOSIS — G959 Disease of spinal cord, unspecified: Secondary | ICD-10-CM

## 2018-07-06 MED ORDER — GADOBENATE DIMEGLUMINE 529 MG/ML IV SOLN
10.0000 mL | Freq: Once | INTRAVENOUS | Status: AC | PRN
  Administered 2018-07-06: 10 mL via INTRAVENOUS

## 2018-07-09 ENCOUNTER — Encounter (HOSPITAL_COMMUNITY): Payer: Self-pay | Admitting: *Deleted

## 2018-07-09 ENCOUNTER — Encounter (HOSPITAL_COMMUNITY)
Admission: RE | Admit: 2018-07-09 | Discharge: 2018-07-09 | Disposition: A | Discharge status: Home / Self Care | Payer: Medicare Other | Source: Ambulatory Visit | Attending: Surgery | Admitting: Surgery

## 2018-07-09 ENCOUNTER — Other Ambulatory Visit: Payer: Self-pay

## 2018-07-09 DIAGNOSIS — Z01812 Encounter for preprocedural laboratory examination: Secondary | ICD-10-CM | POA: Diagnosis not present

## 2018-07-09 LAB — BASIC METABOLIC PANEL
Anion gap: 7 (ref 5–15)
BUN: 17 mg/dL (ref 8–23)
CO2: 25 mmol/L (ref 22–32)
Calcium: 8.9 mg/dL (ref 8.9–10.3)
Chloride: 104 mmol/L (ref 98–111)
Creatinine, Ser: 0.85 mg/dL (ref 0.44–1.00)
GFR calc Af Amer: >60 mL/min (ref >60)
GFR calc non Af Amer: >60 mL/min (ref >60)
Glucose, Bld: 105 mg/dL — ABNORMAL HIGH (ref 70–99)
Potassium: 4.4 mmol/L (ref 3.5–5.1)
Sodium: 136 mmol/L (ref 135–145)

## 2018-07-09 LAB — CBC
HCT: 35.6 % — ABNORMAL LOW (ref 36.0–46.0)
Hemoglobin: 10.8 g/dL — ABNORMAL LOW (ref 12.0–15.0)
MCH: 24.6 pg — ABNORMAL LOW (ref 26.0–34.0)
MCHC: 30.3 g/dL (ref 30.0–36.0)
MCV: 81.1 fL (ref 80.0–100.0)
Platelets: 234 10*3/uL (ref 150–400)
RBC: 4.39 MIL/uL (ref 3.87–5.11)
RDW: 16.4 % — ABNORMAL HIGH (ref 11.5–15.5)
WBC: 8.8 10*3/uL (ref 4.0–10.5)
nRBC: 0 % (ref 0.0–0.2)

## 2018-07-09 NOTE — Progress Notes (Addendum)
06/18/2018-noted in [place] visit with Dr. Lamonte Sakai and Pulmonary Clearance on chart.  06/02/2018- noted in Epic and on chart- Cardiac Clearance from Murray Hodgkins, NP  06/01/2018- Neurology Clearance from Venancio Poisson, NP for Dr. Leonie Man on chart.  04/19/2018-noted in Epic-ECHO  04/18/2018- noted in Epic- EKG and CXR  04/01/2018- noted in Epic- Stress test

## 2018-07-09 NOTE — Patient Instructions (Addendum)
Tina Calderon  07/09/2018   Your procedure is scheduled on: Friday 07/17/2018  Report to [facility] Main  Entrance              Report to admitting at   0730 AM    Call this number if you have problems the morning of surgery [phone]    Remember: Do not eat food or drink liquids :After Midnight.               BRUSH YOUR TEETH MORNING OF SURGERY AND RINSE YOUR MOUTH OUT, NO CHEWING GUM CANDY OR MINTS.     Take these medicines the morning of surgery with A SIP OF WATER: Amlodipine (Norvasc), Levothyroxine (Synthroid), Metoprolol succinate (Toprol-XL), Omeprazole (Prilosec), Loratadine (Claritin)                                You may not have any metal on your body including hair pins and              piercings  Do not wear jewelry, make-up, lotions, powders or perfumes, deodorant             Do not wear nail polish.  Do not shave  48 hours prior to surgery.             Do not bring valuables to the hospital. [place].  Contacts, dentures or bridgework may not be worn into surgery.  Leave suitcase in the car. After surgery it may be brought to your room.                  Please read over the following fact sheets you were given: _____________________________________________________________________             [facility] - Preparing for Surgery Before surgery, you can play an important role.  Because skin is not sterile, your skin needs to be as free of germs as possible.  You can reduce the number of germs on your skin by washing with CHG (chlorahexidine gluconate) soap before surgery.  CHG is an antiseptic cleaner which kills germs and bonds with the skin to continue killing germs even after washing. Please DO NOT use if you have an allergy to CHG or antibacterial soaps.  If your skin becomes reddened/irritated stop using the CHG and inform your nurse when you arrive at Short Stay. Do not shave  (including legs and underarms) for at least 48 hours prior to the first CHG shower.  You may shave your face/neck. Please follow these instructions carefully:  1.  Shower with CHG Soap the night before surgery and the  morning of Surgery.  2.  If you choose to wash your hair, wash your hair first as usual with your  normal  shampoo.  3.  After you shampoo, rinse your hair and body thoroughly to remove the  shampoo.                           4.  Use CHG as you would any other liquid soap.  You can apply chg directly  to the skin and wash  Gently with a scrungie or clean washcloth.  5.  Apply the CHG Soap to your body ONLY FROM THE NECK DOWN.   Do not use on face/ open                           Wound or open sores. Avoid contact with eyes, ears mouth and genitals (private parts).                       Wash face,  Genitals (private parts) with your normal soap.             6.  Wash thoroughly, paying special attention to the area where your surgery  will be performed.  7.  Thoroughly rinse your body with warm water from the neck down.  8.  DO NOT shower/wash with your normal soap after using and rinsing off  the CHG Soap.                9.  Pat yourself dry with a clean towel.            10.  Wear clean pajamas.            11.  Place clean sheets on your bed the night of your first shower and do not  sleep with pets. Day of Surgery : Do not apply any lotions/deodorants the morning of surgery.  Please wear clean clothes to the hospital/surgery center.  FAILURE TO FOLLOW THESE INSTRUCTIONS MAY RESULT IN THE CANCELLATION OF YOUR SURGERY PATIENT SIGNATURE_________________________________  NURSE SIGNATURE__________________________________  ________________________________________________________________________

## 2018-07-10 ENCOUNTER — Telehealth: Payer: Self-pay

## 2018-07-10 NOTE — Telephone Encounter (Signed)
-----  Message from Garvin Fila, MD sent at 07/09/2018  5:27 PM EST ----- Kindly let Tina Calderon know that MRI scan of the neck shows stable appearance of her anterior neck fusion at C5-7 with wear and tear changes above it resulting in moderate left-sided foraminal narrowing at C3-C4 and mild at C4-5.  Overall no significant change compared with previous MRI from 01/31/2018.  No new or worrisome findings

## 2018-07-10 NOTE — Telephone Encounter (Signed)
Notes recorded by Marval Regal, RN on 07/10/2018 at 2:41 PM EST Tried to call patient, vm came on and said put in remote access code. I could not leave a message. Will try on again. ------

## 2018-07-11 ENCOUNTER — Encounter (HOSPITAL_COMMUNITY): Payer: Self-pay | Admitting: Surgery

## 2018-07-11 DIAGNOSIS — E042 Nontoxic multinodular goiter: Secondary | ICD-10-CM | POA: Diagnosis present

## 2018-07-11 DIAGNOSIS — D440 Neoplasm of uncertain behavior of thyroid gland: Secondary | ICD-10-CM | POA: Diagnosis present

## 2018-07-11 NOTE — H&P (Signed)
General Surgery [facility]  Tina Calderon DOB: Dec 26, 1940 Married / Language: English / Race: White Female   History of Present Illness   The patient is a 78 year old female who presents with a thyroid nodule.  CHIEF COMPLAINT:  thyroid neoplasm of uncertain behavior  Patient is referred by Alma Friendly, NP, for surgical evaluation and management of multiple thyroid nodules and thyroid neoplasm of uncertain behavior. Patient was recently found to have bilateral thyroid nodules. She underwent ultrasound examination on March 05, 2018. This showed a normal size thyroid gland containing bilateral nodules. The dominant nodule on the left measured 2.3 cm. It was felt to be moderately suspicious and biopsy was recommended. Patient underwent ultrasound-guided fine-needle aspiration biopsy. Cytopathology showed cytologic atypia of undetermined significance arising in a follicular lesion. This was Bethesda category III. Subsequent AFIRMA assay was found to be suspicious, rendering a 50% chance of malignancy. Therefore the patient is referred to surgery for consideration for resection for definitive diagnosis and management. Patient has a history of hypothyroidism. She takes levothyroxine 88 g daily for many years. She has had no prior head or neck surgery. There is a history of thyroid disease in her mother and her sister, both of whom take medication. There is no family history of thyroid malignancy. Patient has had no prior head or neck surgery. She has had a recent stroke and is taking Plavix. She also has a history of pulmonary fibrosis and is followed by both cardiology and pulmonary medicine. She presents today accompanied by her sister.   Past Surgical History  Foot Surgery  Bilateral. Hysterectomy (not due to cancer) - Complete  Shoulder Surgery  Right. Tonsillectomy   Diagnostic Studies History Colonoscopy  1-5 years ago Mammogram   within last year Pap Smear  1-5 years ago  Allergies  No Known Drug Allergies [05/28/2018]: Allergies Reconciled   Medication History amLODIPine Besylate (5MG Tablet, Oral) Active. Atorvastatin Calcium (40MG Tablet, Oral) Active. Clopidogrel Bisulfate (75MG Tablet, Oral) Active. Esbriet (267MG Capsule, Oral) Active. Levothyroxine Sodium (88MCG Tablet, Oral) Active. Metoprolol Succinate ER (25MG Tablet ER 24HR, Oral) Active. Omeprazole (20MG Capsule DR, Oral) Active. Rosuvastatin Calcium (5MG Tablet, Oral) Active. Medications Reconciled  Social History Caffeine use  Carbonated beverages, Coffee, Tea. No alcohol use  No drug use  Tobacco use  Never smoker.  Family History Arthritis  Mother. Cerebrovascular Accident  Mother, Sister. Hypertension  Mother, Sister. Respiratory Condition  Brother, Sister. Thyroid problems  Mother.  Pregnancy / Birth History  Age at menarche  71 years. Age of menopause  61-50 Contraceptive History  Oral contraceptives. Gravida  2 Maternal age  25-20 Para  2  Other Problems  Cerebrovascular Accident  High blood pressure  Other disease, cancer, significant illness  Thyroid Disease   Review of Systems General Not Present- Appetite Loss, Chills, Fatigue, Fever, Night Sweats, Weight Gain and Weight Loss. Skin Not Present- Change in Wart/Mole, Dryness, Hives, Jaundice, New Lesions, Non-Healing Wounds, Rash and Ulcer. HEENT Present- Wears glasses/contact lenses. Not Present- Earache, Hearing Loss, Hoarseness, Nose Bleed, Oral Ulcers, Ringing in the Ears, Seasonal Allergies, Sinus Pain, Sore Throat, Visual Disturbances and Yellow Eyes. Respiratory Not Present- Bloody sputum, Chronic Cough, Difficulty Breathing, Snoring and Wheezing. Breast Not Present- Breast Mass, Breast Pain, Nipple Discharge and Skin Changes. Cardiovascular Not Present- Chest Pain, Difficulty Breathing Lying Down, Leg Cramps, Palpitations, Rapid  Heart Rate, Shortness of Breath and Swelling of Extremities. Gastrointestinal Not Present- Abdominal Pain, Bloating, Bloody Stool, Change in  Bowel Habits, Chronic diarrhea, Constipation, Difficulty Swallowing, Excessive gas, Gets full quickly at meals, Hemorrhoids, Indigestion, Nausea, Rectal Pain and Vomiting. Female Genitourinary Not Present- Frequency, Nocturia, Painful Urination, Pelvic Pain and Urgency. Musculoskeletal Not Present- Back Pain, Joint Pain, Joint Stiffness, Muscle Pain, Muscle Weakness and Swelling of Extremities. Neurological Present- Weakness. Not Present- Decreased Memory, Fainting, Headaches, Numbness, Seizures, Tingling, Tremor and Trouble walking. Psychiatric Not Present- Anxiety, Bipolar, Change in Sleep Pattern, Depression, Fearful and Frequent crying. Endocrine Not Present- Cold Intolerance, Excessive Hunger, Hair Changes, Heat Intolerance, Hot flashes and New Diabetes. Hematology Not Present- Blood Thinners, Easy Bruising, Excessive bleeding, Gland problems, HIV and Persistent Infections.  Vitals Weight: 128.5 lb Height: 62in Body Surface Area: 1.58 m Body Mass Index: 23.5 kg/m  Temp.: 97.38F  Pulse: 67 (Regular)  P.OX: 92% (Room air) BP: 154/78 (Sitting, Left Arm, Standard)  Physical Exam   See vital signs recorded above  GENERAL APPEARANCE Development: normal Nutritional status: normal Gross deformities: none  SKIN Rash, lesions, ulcers: none Induration, erythema: none Nodules: none palpable  EYES Conjunctiva and lids: normal Pupils: equal and reactive Iris: normal bilaterally  EARS, NOSE, MOUTH, THROAT External ears: no lesion or deformity External nose: no lesion or deformity Hearing: grossly normal Lips: no lesion or deformity Dentition: normal for age Oral mucosa: moist  NECK Symmetric: yes Trachea: midline Thyroid: No palpable nodules in the right thyroid bed. With some difficulty, a nodule is appreciated in the inferior  left thyroid lobe, likely extending beneath the clavicle, more evident with swallowing, nontender. There is no supraclavicular or cervical lymphadenopathy palpable.  CHEST Respiratory effort: normal Retraction or accessory muscle use: no Breath sounds: normal bilaterally Rales, rhonchi, wheeze: none  CARDIOVASCULAR Auscultation: regular rhythm, normal rate Murmurs: none Pulses: carotid and radial pulse 2+ palpable Lower extremity edema: none Lower extremity varicosities: none  MUSCULOSKELETAL Station and gait: normal Digits and nails: no clubbing or cyanosis Muscle strength: grossly normal all extremities Range of motion: grossly normal all extremities Deformity: none  LYMPHATIC Cervical: none palpable Supraclavicular: none palpable  PSYCHIATRIC Oriented to person, place, and time: yes Mood and affect: normal for situation Judgment and insight: appropriate for situation    Assessment & Plan  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  Patient is referred by her primary care provider for evaluation of newly evaluated left thyroid nodule with cytologic atypia and a significant risk of malignancy. Patient is accompanied today by her sister. They are provided with written literature on thyroid surgery to review at home.  Patient has a 2.3 cm nodule in the inferior left thyroid lobe. Molecular genetic testing shows this to be suspicious for malignancy, giving her a risk of cancer of approximately 50%. After review of her ultrasound results and other studies, I have recommended proceeding with left thyroid lobectomy for definitive diagnosis and management. If the nodule is benign, then no further treatment will be necessary. If the nodule is malignant, then we will obtain consultation with endocrinology and ask for guidance regarding further management. Further management may include  completion thyroidectomy and possibly treatment with radioactive iodine. We discussed these issues today.  We discussed the surgical procedure. We discussed the location of the surgical incision. We discussed risk and benefits of surgery including the potential for recurrent laryngeal nerve injury and injury to parathyroid glands. We discussed the hospital stay to be anticipated. We discussed her postoperative recovery. We discussed the potential need for  additional surgery and the potential need for radioactive iodine treatment. Patient understands and wishes to proceed with surgery.  We will obtain clearance from her cardiologist, her pulmonologist, and her neurologist prior to scheduling her for her procedure.  The risks and benefits of the procedure have been discussed at length with the patient. The patient understands the proposed procedure, potential alternative treatments, and the course of recovery to be expected. All of the patient's questions have been answered at this time. The patient wishes to proceed with surgery.  [name], [place] [facility] Office: [phone]

## 2018-07-13 ENCOUNTER — Telehealth: Payer: Self-pay | Admitting: Emergency Medicine

## 2018-07-13 NOTE — Telephone Encounter (Signed)
I spoke with the patient and discussed results of MRI c-spine that were provided for pt by Dr. Leonie Man. She verbalized understanding and appreciation. She will f/u with Janett Billow on 08/12/2018.

## 2018-07-13 NOTE — Telephone Encounter (Signed)
[place] and spoke with pharmacist, who is requesting a VO for Esbriet.  This has been given.  Nothing further needed.

## 2018-07-17 ENCOUNTER — Observation Stay (HOSPITAL_COMMUNITY)
Admission: RE | Admit: 2018-07-17 | Discharge: 2018-07-18 | Disposition: A | Discharge status: Home / Self Care | Payer: Medicare Other | Attending: Surgery | Admitting: Surgery

## 2018-07-17 ENCOUNTER — Encounter (HOSPITAL_COMMUNITY)
Admission: RE | Disposition: A | Discharge status: Home / Self Care | Payer: Self-pay | Source: Home / Self Care | Attending: Surgery

## 2018-07-17 ENCOUNTER — Ambulatory Visit (HOSPITAL_COMMUNITY): Payer: Medicare Other | Admitting: Anesthesiology

## 2018-07-17 ENCOUNTER — Encounter (HOSPITAL_COMMUNITY): Payer: Self-pay | Admitting: *Deleted

## 2018-07-17 ENCOUNTER — Other Ambulatory Visit: Payer: Self-pay

## 2018-07-17 DIAGNOSIS — Z8673 Personal history of transient ischemic attack (TIA), and cerebral infarction without residual deficits: Secondary | ICD-10-CM | POA: Diagnosis not present

## 2018-07-17 DIAGNOSIS — J8410 Pulmonary fibrosis, unspecified: Secondary | ICD-10-CM | POA: Insufficient documentation

## 2018-07-17 DIAGNOSIS — Z79899 Other long term (current) drug therapy: Secondary | ICD-10-CM | POA: Insufficient documentation

## 2018-07-17 DIAGNOSIS — D497 Neoplasm of unspecified behavior of endocrine glands and other parts of nervous system: Principal | ICD-10-CM | POA: Insufficient documentation

## 2018-07-17 DIAGNOSIS — I509 Heart failure, unspecified: Secondary | ICD-10-CM | POA: Insufficient documentation

## 2018-07-17 DIAGNOSIS — R2689 Other abnormalities of gait and mobility: Secondary | ICD-10-CM | POA: Insufficient documentation

## 2018-07-17 DIAGNOSIS — I110 Hypertensive heart disease with heart failure: Secondary | ICD-10-CM | POA: Insufficient documentation

## 2018-07-17 DIAGNOSIS — E042 Nontoxic multinodular goiter: Secondary | ICD-10-CM | POA: Diagnosis not present

## 2018-07-17 DIAGNOSIS — K219 Gastro-esophageal reflux disease without esophagitis: Secondary | ICD-10-CM | POA: Diagnosis not present

## 2018-07-17 DIAGNOSIS — Z79890 Hormone replacement therapy (postmenopausal): Secondary | ICD-10-CM | POA: Diagnosis not present

## 2018-07-17 DIAGNOSIS — E063 Autoimmune thyroiditis: Secondary | ICD-10-CM | POA: Insufficient documentation

## 2018-07-17 DIAGNOSIS — E039 Hypothyroidism, unspecified: Secondary | ICD-10-CM | POA: Diagnosis not present

## 2018-07-17 DIAGNOSIS — D440 Neoplasm of uncertain behavior of thyroid gland: Secondary | ICD-10-CM

## 2018-07-17 DIAGNOSIS — Z7902 Long term (current) use of antithrombotics/antiplatelets: Secondary | ICD-10-CM | POA: Diagnosis not present

## 2018-07-17 HISTORY — PX: THYROID LOBECTOMY: SHX420

## 2018-07-17 SURGERY — LOBECTOMY, THYROID
Anesthesia: General | Laterality: Left

## 2018-07-17 MED ORDER — FENTANYL CITRATE (PF) 100 MCG/2ML IJ SOLN
INTRAMUSCULAR | Status: AC
  Filled 2018-07-17: qty 2

## 2018-07-17 MED ORDER — 0.9 % SODIUM CHLORIDE (POUR BTL) OPTIME
TOPICAL | Status: DC | PRN
  Administered 2018-07-17: 1000 mL

## 2018-07-17 MED ORDER — ACETAMINOPHEN 650 MG RE SUPP: 650.0000 mg | Freq: Four times a day (QID) | RECTAL | Status: DC | PRN

## 2018-07-17 MED ORDER — ACETAMINOPHEN 325 MG PO TABS
650.0000 mg | ORAL_TABLET | Freq: Four times a day (QID) | ORAL | Status: DC | PRN
  Administered 2018-07-18: 650 mg via ORAL
  Filled 2018-07-17: qty 2

## 2018-07-17 MED ORDER — CHLORHEXIDINE GLUCONATE CLOTH 2 % EX PADS: 6.0000 | MEDICATED_PAD | Freq: Once | CUTANEOUS | Status: DC

## 2018-07-17 MED ORDER — ROCURONIUM BROMIDE 100 MG/10ML IV SOLN
INTRAVENOUS | Status: DC | PRN
  Administered 2018-07-17: 45 mg via INTRAVENOUS
  Administered 2018-07-17: 15 mg via INTRAVENOUS

## 2018-07-17 MED ORDER — ACETAMINOPHEN 10 MG/ML IV SOLN: 1000.0000 mg | Freq: Once | INTRAVENOUS | Status: DC | PRN

## 2018-07-17 MED ORDER — LACTATED RINGERS IV SOLN
INTRAVENOUS | Status: DC
  Administered 2018-07-17: 1000 mL via INTRAVENOUS
  Administered 2018-07-17: 08:00:00 via INTRAVENOUS

## 2018-07-17 MED ORDER — ACETAMINOPHEN 160 MG/5ML PO SOLN: 325.0000 mg | Freq: Once | ORAL | Status: DC

## 2018-07-17 MED ORDER — AMLODIPINE BESYLATE 5 MG PO TABS
5.0000 mg | ORAL_TABLET | Freq: Every day | ORAL | Status: DC
  Administered 2018-07-18: 5 mg via ORAL
  Filled 2018-07-17: qty 1

## 2018-07-17 MED ORDER — HYDROCODONE-ACETAMINOPHEN 5-325 MG PO TABS: 1.0000 | ORAL_TABLET | ORAL | Status: DC | PRN

## 2018-07-17 MED ORDER — KCL IN DEXTROSE-NACL 20-5-0.45 MEQ/L-%-% IV SOLN
INTRAVENOUS | Status: DC
  Administered 2018-07-17: 15:00:00 via INTRAVENOUS
  Filled 2018-07-17: qty 1000

## 2018-07-17 MED ORDER — CEFAZOLIN SODIUM-DEXTROSE 2-4 GM/100ML-% IV SOLN
2.0000 g | INTRAVENOUS | Status: AC
  Administered 2018-07-17: 2 g via INTRAVENOUS
  Filled 2018-07-17: qty 100

## 2018-07-17 MED ORDER — PROPOFOL 10 MG/ML IV BOLUS
INTRAVENOUS | Status: AC
  Filled 2018-07-17: qty 40

## 2018-07-17 MED ORDER — TRAMADOL HCL 50 MG PO TABS
50.0000 mg | ORAL_TABLET | Freq: Four times a day (QID) | ORAL | Status: DC | PRN
  Administered 2018-07-17: 50 mg via ORAL
  Filled 2018-07-17: qty 1

## 2018-07-17 MED ORDER — METOPROLOL SUCCINATE ER 25 MG PO TB24
12.5000 mg | ORAL_TABLET | Freq: Every day | ORAL | Status: DC
  Administered 2018-07-18: 12.5 mg via ORAL
  Filled 2018-07-17: qty 1

## 2018-07-17 MED ORDER — LEVOTHYROXINE SODIUM 88 MCG PO TABS
88.0000 ug | ORAL_TABLET | Freq: Every day | ORAL | Status: DC
  Administered 2018-07-18: 88 ug via ORAL
  Filled 2018-07-17: qty 1

## 2018-07-17 MED ORDER — ACETAMINOPHEN 325 MG PO TABS: 325.0000 mg | ORAL_TABLET | Freq: Once | ORAL | Status: DC

## 2018-07-17 MED ORDER — HYDROMORPHONE HCL 1 MG/ML IJ SOLN
1.0000 mg | INTRAMUSCULAR | Status: DC | PRN
  Administered 2018-07-17: 1 mg via INTRAVENOUS
  Filled 2018-07-17: qty 1

## 2018-07-17 MED ORDER — SUGAMMADEX SODIUM 200 MG/2ML IV SOLN
INTRAVENOUS | Status: DC | PRN
  Administered 2018-07-17: 200 mg via INTRAVENOUS

## 2018-07-17 MED ORDER — PROPOFOL 10 MG/ML IV BOLUS
INTRAVENOUS | Status: DC | PRN
  Administered 2018-07-17: 110 mg via INTRAVENOUS

## 2018-07-17 MED ORDER — LACTATED RINGERS IV SOLN: INTRAVENOUS | Status: DC

## 2018-07-17 MED ORDER — LIDOCAINE HCL (CARDIAC) PF 100 MG/5ML IV SOSY
PREFILLED_SYRINGE | INTRAVENOUS | Status: DC | PRN
  Administered 2018-07-17: 60 mg via INTRAVENOUS

## 2018-07-17 MED ORDER — ONDANSETRON HCL 4 MG/2ML IJ SOLN
4.0000 mg | Freq: Four times a day (QID) | INTRAMUSCULAR | Status: DC | PRN
  Filled 2018-07-17: qty 2

## 2018-07-17 MED ORDER — FENTANYL CITRATE (PF) 100 MCG/2ML IJ SOLN
25.0000 ug | INTRAMUSCULAR | Status: DC | PRN
  Administered 2018-07-17 (×2): 50 ug via INTRAVENOUS

## 2018-07-17 MED ORDER — ONDANSETRON 4 MG PO TBDP: 4.0000 mg | ORAL_TABLET | Freq: Four times a day (QID) | ORAL | Status: DC | PRN

## 2018-07-17 MED ORDER — DEXAMETHASONE SODIUM PHOSPHATE 10 MG/ML IJ SOLN
INTRAMUSCULAR | Status: DC | PRN
  Administered 2018-07-17: 5 mg via INTRAVENOUS

## 2018-07-17 MED ORDER — FENTANYL CITRATE (PF) 100 MCG/2ML IJ SOLN
INTRAMUSCULAR | Status: DC | PRN
  Administered 2018-07-17 (×2): 50 ug via INTRAVENOUS

## 2018-07-17 MED ORDER — PIRFENIDONE 267 MG PO CAPS
801.0000 mg | ORAL_CAPSULE | Freq: Three times a day (TID) | ORAL | Status: DC
  Administered 2018-07-17 – 2018-07-18 (×4): 801 mg via ORAL

## 2018-07-17 SURGICAL SUPPLY — 34 items
ADH SKN CLS APL DERMABOND .7 (GAUZE/BANDAGES/DRESSINGS) ×1
ATTRACTOMAT 16X20 MAGNETIC DRP (DRAPES) ×2 IMPLANT
BLADE SURG 15 STRL LF DISP TIS (BLADE) ×1 IMPLANT
BLADE SURG 15 STRL SS (BLADE) ×2
CHLORAPREP W/TINT 26ML (MISCELLANEOUS) ×4 IMPLANT
CLIP VESOCCLUDE MED 6/CT (CLIP) ×4 IMPLANT
CLIP VESOCCLUDE SM WIDE 6/CT (CLIP) ×4 IMPLANT
COVER SURGICAL LIGHT HANDLE (MISCELLANEOUS) ×2 IMPLANT
COVER WAND RF STERILE (DRAPES) ×2 IMPLANT
DERMABOND ADVANCED (GAUZE/BANDAGES/DRESSINGS) ×1
DERMABOND ADVANCED .7 DNX12 (GAUZE/BANDAGES/DRESSINGS) IMPLANT
DRAPE LAPAROTOMY T 98X78 PEDS (DRAPES) ×2 IMPLANT
ELECT PENCIL ROCKER SW 15FT (MISCELLANEOUS) ×2 IMPLANT
ELECT REM PT RETURN 15FT ADLT (MISCELLANEOUS) ×2 IMPLANT
GAUZE 4X4 16PLY RFD (DISPOSABLE) ×2 IMPLANT
GLOVE BIOGEL PI IND STRL 6.5 (GLOVE) IMPLANT
GLOVE BIOGEL PI IND STRL 7.5 (GLOVE) IMPLANT
GLOVE BIOGEL PI INDICATOR 6.5 (GLOVE) ×1
GLOVE BIOGEL PI INDICATOR 7.5 (GLOVE) ×2
GLOVE SURG ORTHO 8.0 STRL STRW (GLOVE) ×2 IMPLANT
GLOVE SURG SS PI 6.5 STRL IVOR (GLOVE) ×1 IMPLANT
GLOVE SURG SS PI 7.0 STRL IVOR (GLOVE) ×1 IMPLANT
GOWN STRL REUS W/TWL XL LVL3 (GOWN DISPOSABLE) ×4 IMPLANT
HEMOSTAT SURGICEL 2X4 FIBR (HEMOSTASIS) ×1 IMPLANT
ILLUMINATOR WAVEGUIDE N/F (MISCELLANEOUS) ×1 IMPLANT
KIT BASIN OR (CUSTOM PROCEDURE TRAY) ×2 IMPLANT
PACK BASIC VI WITH GOWN DISP (CUSTOM PROCEDURE TRAY) ×2 IMPLANT
SHEARS HARMONIC 9CM CVD (BLADE) ×2 IMPLANT
SUT MNCRL AB 4-0 PS2 18 (SUTURE) ×2 IMPLANT
SUT VIC AB 3-0 SH 18 (SUTURE) ×4 IMPLANT
SYR BULB IRRIGATION 50ML (SYRINGE) ×2 IMPLANT
TOWEL OR 17X26 10 PK STRL BLUE (TOWEL DISPOSABLE) ×2 IMPLANT
TOWEL OR NON WOVEN STRL DISP B (DISPOSABLE) ×2 IMPLANT
YANKAUER SUCT BULB TIP 10FT TU (MISCELLANEOUS) ×2 IMPLANT

## 2018-07-17 NOTE — Interval H&P Note (Signed)
History and Physical Interval Note:  07/17/2018 10:15 AM  Tina Calderon  has presented today for surgery, with the diagnosis of THYROID NEOPLASM OF UNCERTAIN BEHAVIOR.  The various methods of treatment have been discussed with the patient and family. After consideration of risks, benefits and other options for treatment, the patient has consented to    Procedure(s): LEFT THYROID LOBECTOMY (Left) as a surgical intervention .    The patient's history has been reviewed, patient examined, no change in status, stable for surgery.  I have reviewed the patient's chart and labs.  Questions were answered to the patient's satisfaction.    [name], [place] [facility] Office: [place]

## 2018-07-17 NOTE — Anesthesia Preprocedure Evaluation (Addendum)
Anesthesia Evaluation  Patient identified by MRN, date of birth, ID band Patient awake    Reviewed: Allergy & Precautions, NPO status , Patient's Chart, lab work & pertinent test results, reviewed documented beta blocker date and time   Airway Mallampati: I  TM Distance: >3 FB Neck ROM: Full    Dental  (+) Upper Dentures, Lower Dentures   Pulmonary asthma ,    breath sounds clear to auscultation       Cardiovascular hypertension, Pt. on medications and Pt. on home beta blockers +CHF   Rhythm:Regular Rate:Normal     Neuro/Psych CVA    GI/Hepatic Neg liver ROS, GERD  Medicated,  Endo/Other  Hypothyroidism   Renal/GU negative Renal ROS     Musculoskeletal negative musculoskeletal ROS (+)   Abdominal Normal abdominal exam  (+)   Peds  Hematology  (+) anemia ,   Anesthesia Other Findings - Pulmonary Fibrosis  Reproductive/Obstetrics                            Lab Results  Component Value Date   WBC 8.8 07/09/2018   HGB 10.8 (L) 07/09/2018   HCT 35.6 (L) 07/09/2018   MCV 81.1 07/09/2018   PLT 234 07/09/2018   Lab Results  Component Value Date   CREATININE 0.85 07/09/2018   BUN 17 07/09/2018   NA 136 07/09/2018   K 4.4 07/09/2018   CL 104 07/09/2018   CO2 25 07/09/2018   Lab Results  Component Value Date   INR 1.03 04/18/2018   INR 1.14 01/20/2012   Echo: - Left ventricle: The cavity size was normal. Wall thickness was   increased in a pattern of mild LVH. Systolic function was   vigorous. The estimated ejection fraction was in the range of 65%   to 70%. Wall motion was normal; there were no regional wall   motion abnormalities. Doppler parameters are consistent with   abnormal left ventricular relaxation (grade 1 diastolic   dysfunction). Doppler parameters are consistent with   indeterminate ventricular filling pressure. - Aortic valve: Mildly calcified annulus.  Trileaflet. - Mitral valve: Mildly calcified annulus. - Left atrium: The atrium was mildly dilated. - Atrial septum: No defect or patent foramen ovale was identified. - Tricuspid valve: There was moderate regurgitation. - Pulmonary arteries: PA peak pressure: 63 mm Hg (S). - Pericardium, extracardiac: A trivial pericardial effusion was   identified.  Anesthesia Physical Anesthesia Plan  ASA: III  Anesthesia Plan: General   Post-op Pain Management:    Induction: Intravenous  PONV Risk Score and Plan: 4 or greater and Ondansetron, Dexamethasone and Treatment may vary due to age or medical condition  Airway Management Planned: Simple Face Mask and Natural Airway  Additional Equipment: None  Intra-op Plan:   Post-operative Plan: Extubation in OR  Informed Consent: I have reviewed the patients History and Physical, chart, labs and discussed the procedure including the risks, benefits and alternatives for the proposed anesthesia with the patient or authorized representative who has indicated his/her understanding and acceptance.       Plan Discussed with: CRNA  Anesthesia Plan Comments:        Anesthesia Quick Evaluation

## 2018-07-17 NOTE — Op Note (Signed)
Procedure Note  Pre-operative Diagnosis:  Thyroid neoplasm of uncertain behavior, left lobe  Post-operative Diagnosis:  same  Surgeon:  [name], MD  Assistant:  none   Procedure:  Left thyroid lobectomy and isthmusectomy  Anesthesia:  General  Estimated Blood Loss:  minimal  Drains: none         Specimen: thyroid lobe to pathology  Indications:  Patient is referred by Alma Friendly, NP, for surgical evaluation and management of multiple thyroid nodules and thyroid neoplasm of uncertain behavior. Patient was recently found to have bilateral thyroid nodules. She underwent ultrasound examination on March 05, 2018. This showed a normal size thyroid gland containing bilateral nodules. The dominant nodule on the left measured 2.3 cm. It was felt to be moderately suspicious and biopsy was recommended. Patient underwent ultrasound-guided fine-needle aspiration biopsy. Cytopathology showed cytologic atypia of undetermined significance arising in a follicular lesion. This was Bethesda category III. Subsequent AFIRMA assay was found to be suspicious, rendering a 50% chance of malignancy. Therefore the patient is referred to surgery for consideration for resection for definitive diagnosis and management.   Procedure Details: Procedure was done in OR #4 at the [facility]. The patient was brought to the operating room and placed in a supine position on the operating room table. Following administration of general anesthesia, the patient was positioned and then prepped and draped in the usual aseptic fashion. After ascertaining that an adequate level of anesthesia had been achieved, a small Kocher incision was made with #15 blade. Dissection was carried through subcutaneous tissues and platysma. Hemostasis was achieved with the electrocautery. Skin flaps were elevated cephalad and caudad from the thyroid notch to the sternal notch. A self-retaining retractor was placed for  exposure. Strap muscles were incised in the midline and dissection was begun on the left side. Strap muscles were reflected laterally. The left thyroid lobe was mildly enlarged with a dominant mass in the mid to lower pole. The lobe was gently mobilized with blunt dissection. Superior pole vessels were dissected out and divided individually between small and medium ligaclips with the harmonic scalpel. The thyroid lobe was rolled anteriorly. Branches of the inferior thyroid artery were divided between small ligaclips with the harmonic scalpel. Inferior venous tributaries were divided between ligaclips. Both the superior and inferior parathyroid glands were identified and preserved on their vascular pedicles. The recurrent laryngeal nerve was identified and preserved along its course. The ligament of Gwenlyn Found was released with the electrocautery and the gland was mobilized onto the anterior trachea. Isthmus was mobilized across the midline. There was a small pyramidal lobe present which was resected with the isthmus. The thyroid parenchyma was transected at the junction of the isthmus and contralateral thyroid lobe with the harmonic scalpel. The thyroid lobe and isthmus were submitted to pathology for review.  The neck was irrigated with warm saline. Fibrillar was placed throughout the operative field. Strap muscles were approximated in the midline with interrupted 3-0 Vicryl sutures. Platysma was closed with interrupted 3-0 Vicryl sutures. Skin was closed with a running 4-0 Monocryl subcuticular suture.  Wound was washed and dried and Dermabond was applied. The patient was awakened from anesthesia and brought to the recovery room. The patient tolerated the procedure well.   [name], MD [facility] Office: [phone]

## 2018-07-17 NOTE — Anesthesia Postprocedure Evaluation (Signed)
Anesthesia Post Note  Patient: Tina Calderon  Procedure(s) Performed: LEFT THYROID LOBECTOMY (Left )     Patient location during evaluation: PACU Anesthesia Type: General Level of consciousness: awake and alert Pain management: pain level controlled Vital Signs Assessment: post-procedure vital signs reviewed and stable Respiratory status: spontaneous breathing, nonlabored ventilation, respiratory function stable and patient connected to nasal cannula oxygen Cardiovascular status: blood pressure returned to baseline and stable Postop Assessment: no apparent nausea or vomiting Anesthetic complications: no    Last Vitals:  Vitals:   07/17/18 1245 07/17/18 1300  BP: (!) 169/89 (!) 163/79  Pulse: 69 70  Resp: 10 (!) 23  Temp:  36.6 C  SpO2: 100% 100%    Last Pain:  Vitals:   07/17/18 1300  TempSrc:   PainSc: Asleep                 Effie Berkshire

## 2018-07-17 NOTE — Anesthesia Procedure Notes (Signed)
Procedure Name: Intubation Date/Time: 07/17/2018 10:44 AM Performed by: Clyde Lundborg, RN Pre-anesthesia Checklist: Patient identified, Emergency Drugs available, Suction available, Patient being monitored and Timeout performed Patient Re-evaluated:Patient Re-evaluated prior to induction Oxygen Delivery Method: Circle system utilized Preoxygenation: Pre-oxygenation with 100% oxygen Induction Type: IV induction Ventilation: Mask ventilation without difficulty Laryngoscope Size: Mac and 3 Grade View: Grade I Tube type: Oral Tube size: 7.0 mm Number of attempts: 1 Airway Equipment and Method: Stylet Placement Confirmation: ETT inserted through vocal cords under direct vision,  positive ETCO2 and breath sounds checked- equal and bilateral Secured at: 19 cm Tube secured with: Tape Dental Injury: Teeth and Oropharynx as per pre-operative assessment  Comments: 7.0 ETT passed through cords without difficulty

## 2018-07-17 NOTE — Transfer of Care (Signed)
Immediate Anesthesia Transfer of Care Note  Patient: Tina Calderon  Procedure(s) Performed: LEFT THYROID LOBECTOMY (Left )  Patient Location: PACU  Anesthesia Type:General  Level of Consciousness: awake and alert   Airway & Oxygen Therapy: Patient Spontanous Breathing  Post-op Assessment: Report given to RN, Post -op Vital signs reviewed and stable and Patient moving all extremities  Post vital signs: Reviewed and stable  Last Vitals:  Vitals Value Taken Time  BP 162/89 07/17/2018 11:55 AM  Temp    Pulse 77 07/17/2018 11:59 AM  Resp 16 07/17/2018 11:55 AM  SpO2 100 % 07/17/2018 11:59 AM  Vitals shown include unvalidated device data.  Last Pain:  Vitals:   07/17/18 0748  TempSrc:   PainSc: 0-No pain         Complications: No apparent anesthesia complications

## 2018-07-18 ENCOUNTER — Encounter (HOSPITAL_COMMUNITY): Payer: Self-pay | Admitting: Surgery

## 2018-07-18 DIAGNOSIS — D497 Neoplasm of unspecified behavior of endocrine glands and other parts of nervous system: Secondary | ICD-10-CM | POA: Diagnosis not present

## 2018-07-18 NOTE — Evaluation (Signed)
Physical Therapy Evaluation Patient Details Name: Tina Calderon MRN: [id number] DOB: 12/21/1940 Today's Date: 07/18/2018   History of Present Illness  78 yo female s/p L thyroid lobectomy and isthmusectomy on 07/17/18, secondary to thyroid neoplasm. PMH includes ILD, HTN, asthma, ShOB, CVA 04/2018, chronic fatigue, CHF, cancer, angina.   Clinical Impression   Pt presents with LE weakness, difficulty with challenges to gait, and decreased tolerance for ambulation. Pt to benefit from acute PT to address deficits. Pt ambulated 250 ft with no AD, with min guard assist when PT challenged pt's gait. PT recommending HHPT to address mobility deficits upon d/c from hospital. PT to progress mobility as tolerated, and will continue to follow acutely.      Follow Up Recommendations Supervision for mobility/OOB;Home health PT    Equipment Recommendations  None recommended by PT    Recommendations for Other Services       Precautions / Restrictions Precautions Precautions: Fall Restrictions Weight Bearing Restrictions: No      Mobility  Bed Mobility Overal bed mobility: Modified Independent             General bed mobility comments: Increased time to perform, pt with self-steadying upon sitting EOB.   Transfers Overall transfer level: Needs assistance Equipment used: None Transfers: Sit to/from Stand Sit to Stand: Supervision;From elevated surface         General transfer comment: Supervision for safety, pt with sit to stand x2, once from bed and once from [facility] after toileting. Pt independent in pericare.   Ambulation/Gait Ambulation/Gait assistance: Supervision;Min guard Gait Distance (Feet): 250 Feet Assistive device: None Gait Pattern/deviations: Step-through pattern;Decreased stride length Gait velocity: decr    General Gait Details: Min guard to supervision for safety, closer guard required when challenging pt's gait.   Stairs            Wheelchair Mobility     Modified Rankin (Stroke Patients Only)       Balance Overall balance assessment: Needs assistance;History of Falls(Pt with fall 3 weeks ago, bruising along L thigh ) Sitting-balance support: No upper extremity supported Sitting balance-Leahy Scale: Good     Standing balance support: Bilateral upper extremity supported Standing balance-Leahy Scale: Fair Standing balance comment: unsteady when challenging gait, pt-corrected              High level balance activites: Head turns;Other (comment) High Level Balance Comments: Pt with slight change in BOS and weaving with head turning horizontally and vertically, changes in gait speed from fast to slow              Pertinent Vitals/Pain Pain Assessment: Faces Faces Pain Scale: Hurts a little bit Pain Location: throat Pain Descriptors / Indicators: Sore Pain Intervention(s): Limited activity within patient's tolerance;Repositioned;Monitored during session    Home Living Family/patient expects to be discharged to:: Private residence Living Arrangements: Spouse/significant other Available Help at Discharge: Family;Available 24 hours/day;Friend(s)(Pt's husband not able to physically assist, but pt's neighbor and friend Kalman Shan is) Type of Home: Apartment Home Access: Level entry     Home Layout: One level Home Equipment: Grab bars - tub/shower;Grab bars - toilet;Walker - 2 wheels;Cane - single point Additional Comments: Husband not able to physically assist, uses RW for mobility.     Prior Function Level of Independence: Independent               Hand Dominance   Dominant Hand: Right    Extremity/Trunk Assessment   Upper Extremity Assessment Upper Extremity Assessment: Overall [facility]  for tasks assessed    Lower Extremity Assessment Lower Extremity Assessment: Generalized weakness;RLE deficits/detail;LLE deficits/detail RLE Deficits / Details: MMT equal L and R: hip flexion 3+/5, hip abduction 3+/5, hip adduction  3+/5, knee extension 4/5, knee flexion 3+/5, DF/PF WNL LLE Deficits / Details: see above    Cervical / Trunk Assessment Cervical / Trunk Assessment: Normal  Communication   Communication: No difficulties  Cognition Arousal/Alertness: Awake/alert Behavior During Therapy: WFL for tasks assessed/performed Overall Cognitive Status: Within Functional Limits for tasks assessed                                        General Comments      Exercises     Assessment/Plan    PT Assessment Patient needs continued PT services  PT Problem List Decreased strength;Pain;Decreased activity tolerance;Decreased balance       PT Treatment Interventions DME instruction;Therapeutic activities;Gait training;Patient/family education;Stair training;Balance training;Therapeutic exercise;Functional mobility training    PT Goals (Current goals can be found in the Care Plan section)  Acute Rehab PT Goals Patient Stated Goal: get stronger  PT Goal Formulation: With patient Time For Goal Achievement: 07/25/18 Potential to Achieve Goals: Good    Frequency Min 3X/week   Barriers to discharge        Co-evaluation               AM-PAC PT "6 Clicks" Mobility  Outcome Measure Help needed turning from your back to your side while in a flat bed without using bedrails?: A Little Help needed moving from lying on your back to sitting on the side of a flat bed without using bedrails?: A Little Help needed moving to and from a bed to a chair (including a wheelchair)?: A Little Help needed standing up from a chair using your arms (e.g., wheelchair or bedside chair)?: A Little Help needed to walk in hospital room?: A Little Help needed climbing 3-5 steps with a railing? : A Little 6 Click Score: 18    End of Session Equipment Utilized During Treatment: Gait belt Activity Tolerance: Patient tolerated treatment well;Patient limited by pain Patient left: in bed;with bed alarm set;with  call bell/phone within reach Nurse Communication: Mobility status PT Visit Diagnosis: Other abnormalities of gait and mobility (R26.89);Difficulty in walking, not elsewhere classified (R26.2)    Time: 5852-7782 PT Time Calculation (min) (ACUTE ONLY): 31 min   Charges:   PT Evaluation $PT Eval Low Complexity: 1 Low PT Treatments $Gait Training: 8-22 mins        Julien Girt, PT Acute Rehabilitation Services Pager [phone]  Office [phone]  Orlando Devereux D Elonda Husky 07/18/2018, 3:28 PM

## 2018-07-18 NOTE — Discharge Summary (Signed)
Physician Discharge Summary [facility]  Patient ID: Tina Calderon MRN: [id number] DOB/AGE: 1941-04-11 78 y.o.  Admit date: 07/17/2018 Discharge date: 07/18/2018  Admission Diagnoses:  Thyroid neoplasm of uncertain behavior  Discharge Diagnoses:  Principal Problem:   Neoplasm of uncertain behavior of thyroid gland Active Problems:   Multiple thyroid nodules   Discharged Condition: good  Hospital Course: Patient was admitted for observation following thyroid surgery.  Post op course was uncomplicated except for an episode of leg weakness.  This has apparently been an intermittent problem since last August.  Patient has been evaluated by neurology and with MRI scans without significant findings.  Patient was evaluated by physical therapy at my request and home PT was recommended if desired by the patient.  Pain was well controlled.  Tolerated diet.  Patient was prepared for discharge home on POD#1.  Consults: physical therapy  Treatments: surgery: thyroid lobectomy  Discharge Exam: Blood pressure 132/67, pulse 79, temperature 97.6 F (36.4 C), temperature source Oral, resp. rate 18, height _0  (1.575 m), weight 58.5 kg, SpO2 96 %. HEENT - clear Neck - wound dry and intact, Dermabond in place, minimal STS; voice normal Chest - clear bilaterally Cor - RRR  Disposition: Home  Discharge Instructions    Diet - low sodium heart healthy   Complete by:  As directed    Discharge instructions   Complete by:  As directed    [facility]  THYROID & PARATHYROID SURGERY:  POST-OP INSTRUCTIONS  Always review your discharge instruction sheet from the facility where your surgery was performed.  A prescription for pain medication may be given to you upon discharge.  Take your pain medication as prescribed.  If narcotic pain medicine is not needed, then you may take acetaminophen (Tylenol) or ibuprofen (Advil) as needed.  Take your usually  prescribed medications unless otherwise directed.  If you need a refill on your pain medication, please contact our office during regular business hours.  Prescriptions cannot be processed by our office after 5 pm or on weekends.  Start with a light diet upon arrival home, such as soup and crackers or toast.  Be sure to drink plenty of fluids daily.  Resume your normal diet the day after surgery.  Most patients will experience some swelling and bruising on the chest and neck area.  Ice packs will help.  Swelling and bruising can take several days to resolve.   It is common to experience some constipation after surgery.  Increasing fluid intake and taking a stool softener (Colace) will usually help or prevent this problem.  A mild laxative (Milk of Magnesia or Miralax) should be taken according to package directions if there has been no bowel movement after 48 hours.  You have steri-strips and a gauze dressing over your incision.  You may remove the gauze bandage on the second day after surgery, and you may shower at that time.  Leave your steri-strips (small skin tapes) in place directly over the incision.  These strips should remain on the skin for 5-7 days and then be removed.  You may get them wet in the shower and pat them dry.  You may resume regular (light) daily activities beginning the next day (such as daily self-care, walking, climbing stairs) gradually increasing activities as tolerated.  You may have sexual intercourse when it is comfortable.  Refrain from any heavy lifting or straining until approved by your doctor.  You may drive when  you no longer are taking prescription pain medication, you can comfortably wear a seatbelt, and you can safely maneuver your car and apply brakes.  You should see your doctor in the office for a follow-up appointment approximately three weeks after your surgery.  Make sure that you call for this appointment within a day or two after you arrive home to insure  a convenient appointment time.  WHEN TO CALL YOUR DOCTOR: -- Fever greater than 101.5 -- Inability to urinate -- Nausea and/or vomiting - persistent -- Extreme swelling or bruising -- Continued bleeding from incision -- Increased pain, redness, or drainage from the incision -- Difficulty swallowing or breathing -- Muscle cramping or spasms -- Numbness or tingling in hands or around lips  The clinic staff is available to answer your questions during regular business hours.  Please don't hesitate to call and ask to speak to one of the nurses if you have concerns.  [name], MD [facility] Office: [phone]   Increase activity slowly   Complete by:  As directed    No dressing needed   Complete by:  As directed      Allergies as of 07/18/2018      Reactions   Dilaudid [hydromorphone] Nausea And Vomiting   Crestor [rosuvastatin Calcium] Other (See Comments)   Myalgias, muscle weakness      Medication List    TAKE these medications   acetaminophen 500 MG tablet Commonly known as:  TYLENOL Take 1,000 mg by mouth every 6 (six) hours as needed for mild pain or headache.   amLODipine 5 MG tablet Commonly known as:  NORVASC TAKE 1 TABLET BY MOUTH EVERY DAY FOR BLOOD PRESSURE What changed:  See the new instructions.   atorvastatin 40 MG tablet Commonly known as:  LIPITOR Take 1 tablet (40 mg total) by mouth at bedtime. For cholesterol.   CALCIUM CARBONATE-VITAMIN D PO Take 1 tablet by mouth daily.   CENTRUM SILVER PO Take 1 tablet by mouth daily.   clopidogrel 75 MG tablet Commonly known as:  PLAVIX Take 1 tablet (75 mg total) by mouth daily. For stroke prevention.   CRANBERRY PO Take 1 capsule by mouth every Monday, Wednesday, and Friday.   ESBRIET 267 MG Caps Generic drug:  Pirfenidone TAKE 3 CAPSULES BY MOUTH THREE TIMES DAILY WITH FOOD What changed:  See the new instructions.   levothyroxine 88 MCG tablet Commonly known as:  SYNTHROID,  LEVOTHROID Take 1 tablet (88 mcg total) by mouth daily before breakfast.   loratadine 10 MG tablet Commonly known as:  CLARITIN Take 10 mg by mouth daily.   metoprolol succinate 25 MG 24 hr tablet Commonly known as:  TOPROL-XL TAKE 1/2 TABLET BY MOUTH EVERY DAY   omeprazole 20 MG capsule Commonly known as:  PRILOSEC TAKE 1 CAPSULE (20 MG TOTAL) BY MOUTH DAILY. What changed:  See the new instructions.      Follow-up Information    [name], MD. Schedule an appointment as soon as possible for a visit in 3 week(s).   Specialty:  General Surgery Contact information: [address] [place] [address] [phone]           Earnstine Regal, MD, [facility] Office: [phone]   Signed: [name] 07/18/2018, 2:26 PM

## 2018-07-18 NOTE — Progress Notes (Signed)
Assessment unchanged. Pt verbalized understanding of dc instructions through teach back including follow up care and when to cal the doctor. No scripts. Discharged via wc to front entrance to meet sister. Accompanied by NT.

## 2018-07-18 NOTE — Progress Notes (Signed)
Assessment & Plan: POD#1 - status post left thyroid lobectomy for neoplasm of uncertain behavior  Doing well, no significant pain  Voice normal, tolerating diet  Await path results next week  Normally patient would be prepared for discharge home this morning.  However, the patient states that she was able to ambulate to the bathroom and then attempted to walk out to the hallway.  She reached the doorway with the nurse in attendance and then said felt that her legs were going to collapse and that she was not able to walk any further.  She was put back to bed.  Patient states that this is happened 4 times since August.  Each time she required admission to rehab.  Patient states that she has been evaluated by neurology and has had MRI scans all of which have been unrevealing.  I have encouraged the patient to have breakfast and to attempt ambulation again.  We will ask physical therapy to evaluate the patient this morning.  We will attempt ambulation again later this morning and review the results of physical therapy assessment before making further decisions on management.        [name], MD       [facility]       Office: [phone]   Chief Complaint: thryoid neoplasm of uncertain behavior  Subjective: Patient in bed, pleasant, denies pain.  States that her legs "collapsed" and she cannot walk.  Nursing in room.  Objective: Vital signs in last 24 hours: Temp:  [97.4 F (36.3 C)-97.9 F (36.6 C)] 97.4 F (36.3 C) (02/08 0527) Pulse Rate:  [66-81] 77 (02/08 0527) Resp:  [10-23] 20 (02/08 0527) BP: (120-169)/(69-92) 120/69 (02/08 0527) SpO2:  [99 %-100 %] 99 % (02/08 0527)    Intake/Output from previous day: 02/07 0701 - 02/08 0700 In: 1832 [P.O.:60; I.V.:1772] Out: 2340 [Urine:2325; Blood:15] Intake/Output this shift: No intake/output data recorded.  Physical Exam: HEENT - sclerae clear, mucous membranes moist Neck - soft, incision dry and  intact; minimal STS; voice normal Ext - no edema, non-tender Neuro - alert & oriented, no focal deficits  Lab Results:  No results for input(s): WBC, HGB, HCT, PLT in the last 72 hours. BMET No results for input(s): NA, K, CL, CO2, GLUCOSE, BUN, CREATININE, CALCIUM in the last 72 hours. PT/INR No results for input(s): LABPROT, INR in the last 72 hours. Comprehensive Metabolic Panel:    Component Value Date/Time   NA 136 07/09/2018 1107   NA 138 04/20/2018 0547   NA 138 12/18/2013 1504   K 4.4 07/09/2018 1107   K 3.8 04/20/2018 0547   K 3.4 (L) 12/18/2013 1504   CL 104 07/09/2018 1107   CL 106 04/20/2018 0547   CL 103 12/18/2013 1504   CO2 25 07/09/2018 1107   CO2 26 04/20/2018 0547   CO2 24 12/18/2013 1504   BUN 17 07/09/2018 1107   BUN 10 04/20/2018 0547   BUN 17 12/18/2013 1504   CREATININE 0.85 07/09/2018 1107   CREATININE 0.81 04/20/2018 0547   CREATININE 0.88 12/18/2013 1504   GLUCOSE 105 (H) 07/09/2018 1107   GLUCOSE 88 04/20/2018 0547   GLUCOSE 125 (H) 12/18/2013 1504   CALCIUM 8.9 07/09/2018 1107   CALCIUM 8.7 (L) 04/20/2018 0547   CALCIUM 8.5 12/18/2013 1504   AST 17 05/27/2018 0808   AST 23 04/18/2018 0931   AST 32 12/18/2013 1504   ALT 12 05/27/2018 0808   ALT 12 04/18/2018  0931   ALT 34 12/18/2013 1504   ALKPHOS 64 05/27/2018 0808   ALKPHOS 52 04/18/2018 0931   ALKPHOS 82 12/18/2013 1504   BILITOT 0.4 05/27/2018 0808   BILITOT 0.5 04/18/2018 0931   BILITOT 0.3 12/18/2013 1504   PROT 7.0 05/27/2018 0808   PROT 6.4 (L) 04/18/2018 0931   PROT 7.1 12/18/2013 1504   ALBUMIN 4.2 05/27/2018 0808   ALBUMIN 3.8 04/18/2018 0931   ALBUMIN 3.5 12/18/2013 1504    Studies/Results: No results found.    [name] 07/18/2018  Patient ID: Tina Calderon, female   DOB: 07/08/1940, 78 y.o.   MRN: [id number]

## 2018-07-21 ENCOUNTER — Telehealth: Payer: Self-pay

## 2018-07-21 DIAGNOSIS — R29898 Other symptoms and signs involving the musculoskeletal system: Secondary | ICD-10-CM

## 2018-07-21 DIAGNOSIS — R269 Unspecified abnormalities of gait and mobility: Secondary | ICD-10-CM

## 2018-07-21 NOTE — Telephone Encounter (Signed)
Noted and agree to home physical therapy. Did someone already place this referral order we need to take care of this?

## 2018-07-21 NOTE — Telephone Encounter (Signed)
Received vm from Mammie Lorenzo (? Spelling), nurse with [facility]- Dr. Rayann Heman office.  She states Pt had a left thyroid lobectomy on 07/17/18.  During her hospital stay, pt had some left leg weakness. Pt was evaluated by PT and recommend HH PT, if desired. Claiborne Billings says the notes are in [facility]. Pls call [phone] with any questions.

## 2018-07-22 NOTE — Telephone Encounter (Signed)
Spoken to Tina Calderon was told that Dr Harlow Asa ask if Anda Kraft would place the order in for patient.

## 2018-07-22 NOTE — Telephone Encounter (Signed)
Noted, referral placed.  

## 2018-08-12 ENCOUNTER — Ambulatory Visit: Payer: Medicare Other | Admitting: Adult Health

## 2018-08-20 ENCOUNTER — Encounter: Payer: Self-pay | Admitting: Adult Health

## 2018-08-20 ENCOUNTER — Other Ambulatory Visit: Payer: Self-pay

## 2018-08-20 ENCOUNTER — Ambulatory Visit: Payer: Medicare Other | Admitting: Adult Health

## 2018-08-20 VITALS — BP 125/66 | HR 95 | Ht 62.0 in | Wt 129.8 lb

## 2018-08-20 DIAGNOSIS — E785 Hyperlipidemia, unspecified: Secondary | ICD-10-CM

## 2018-08-20 DIAGNOSIS — I6381 Other cerebral infarction due to occlusion or stenosis of small artery: Secondary | ICD-10-CM

## 2018-08-20 DIAGNOSIS — R29898 Other symptoms and signs involving the musculoskeletal system: Secondary | ICD-10-CM

## 2018-08-20 DIAGNOSIS — I10 Essential (primary) hypertension: Secondary | ICD-10-CM | POA: Diagnosis not present

## 2018-08-20 NOTE — Patient Instructions (Signed)
Continue clopidogrel 75 mg daily  and Lipitor for secondary stroke prevention  Continue to follow up with PCP regarding cholesterol and blood pressure management   Continue to monitor blood pressure at home  Maintain strict control of hypertension with blood pressure goal below 130/90, diabetes with hemoglobin A1c goal below 6.5% and cholesterol with LDL cholesterol (bad cholesterol) goal below 70 mg/dL. I also advised the patient to eat a healthy diet with plenty of whole grains, cereals, fruits and vegetables, exercise regularly and maintain ideal body weight.  Followup in the future with me in 6 months or call earlier if needed       Thank you for coming to see [place] at [facility]. I hope we have been able to provide you high quality care today.  You may receive a patient satisfaction survey over the next few weeks. We would appreciate your feedback and comments so that we may continue to improve ourselves and the health of our patients.

## 2018-08-20 NOTE — Progress Notes (Signed)
[facility] [address] [place]. [place] [address] [phone]       OFFICE CONSULT NOTE  Tina Calderon Date of Birth:  1941-02-29 Medical Record Number:  [id number]   Referring MD: Estill Cotta Reason for Referral: Leg weakness  HPI:   08/20/18 VISIT She is being seen today for 79-monthfollow-up visit and overall has been doing well without recurrent stroke/TIA symptoms.  She did undergo EMG/NCV which was unremarkable along with MRI cervical spine which did show moderate left-sided foraminal narrowing but overall no significant changes compared with prior MRI in 01/2018.  She states overall she has been doing well and even feels like she has developed increased strength in bilateral lower extremities without any recurrent episodes of bilateral leg weakness.  She continues on Plavix with mild bruising but denies bleeding.  Continues on atorvastatin without side effects myalgias.  Blood pressure today satisfactory 125/66.  Denies new or worsening stroke/TIA symptoms.    INITIAL VISIT 05/12/18: Tina Calderon a 78year old pleasant Caucasian lady seen today for initial office consultation visit for leg weakness episodes.  History is obtained from her as well as her sister was accompanying her and review of electronic medical records.  I personally reviewed imaging films in PACS.  [facility] when she developed sudden onset of leg weakness.  She initially stated she is also had some speech difficulties.  She was admitted for stroke work-up.  CT head showed no acute abnormality and only changes of chronic small vessel disease.  CT angiogram showed no large vessel intracranial or extracranial stenosis.  MRI scan of the brain showed a small 5 mm left centrum semiovale lacunar infarct.  Transthoracic echo showed normal ejection fraction.  LDL cholesterol was elevated at 132 mg percent and hemoglobin A1c was 5.6.  Patient was  started on dual antiplatelet therapy of aspirin and Plavix for 3 weeks and is currently on Plavix alone.  The patient stated she had had 2 previous episodes of sudden onset of bilateral leg weakness on 01/31/2018 as well as 03/15/2018 when she was seen in the hospital on both occasions.  Brain imaging on both occasions did not show an acute stroke.  She went underwent MRI scan of the cervical thoracic and lumbar spine during the initial admission on 01/31/2018 which I personally reviewed did not show significant compressive etiology to explain her leg weakness.  Patient denies any tingling numbness paresthesias.  She does complain of some heaviness and pain in her thigh muscles intermittently.  She is tolerating Lipitor well without any side effects.  Blood pressure is well controlled today it is 125/74.  She denies significant bruising or bleeding on Plavix.  She is currently participating in home physical and occupational therapy.  She does have remote history of cervical spine surgery and C5-7 fusion by Dr. YLorin Mercyin 2010 but denies any symptoms of neck pain or radicular pain.  She states she has had this frequent episodes of legs giving out and passing without loss of consciousness or injury multiple times over the years.  There are no specific triggers for these episodes except she feels that when it is hot she is more likely to fall.  She has no history of significant head injury or loss of consciousness, seizures migraine headaches.  ROS:   14 system review of systems is positive for see HPI and all other systems negative  PMH:  Past Medical History:  Diagnosis Date  .  Anginal pain ([place])   . Cancer ([place])   . CHF (congestive heart failure) ([place]) 01/22/2012  . Chronic fatigue 07/15/2014  . CVA (cerebral vascular accident) ([place]) 04/18/2018  . GERD (gastroesophageal reflux disease) 05/12/2017  . Hypertension   . Hypothyroidism   . ILD (interstitial lung disease) ([place]) 02/04/2012  . IPF (idiopathic  pulmonary fibrosis) ([place]) 2013  . Mild intermittent asthma 01/29/2016  . Pelvic relaxation   . Prolapse of bladder   . Shortness of breath   . Thyroid disease     Social History:  Social History   Socioeconomic History  . Marital status: Married    Spouse name: Not on file  . Number of children: Not on file  . Years of education: Not on file  . Highest education level: Not on file  Occupational History  . Not on file  Social Needs  . Financial resource strain: Not on file  . Food insecurity:    Worry: Not on file    Inability: Not on file  . Transportation needs:    Medical: Not on file    Non-medical: Not on file  Tobacco Use  . Smoking status: Never Smoker  . Smokeless tobacco: Never Used  Substance and Sexual Activity  . Alcohol use: No  . Drug use: No  . Sexual activity: Never    Birth control/protection: Post-menopausal, Surgical    Comment: HYST  Lifestyle  . Physical activity:    Days per week: Not on file    Minutes per session: Not on file  . Stress: Not on file  Relationships  . Social connections:    Talks on phone: Not on file    Gets together: Not on file    Attends religious service: Not on file    Active member of club or organization: Not on file    Attends meetings of clubs or organizations: Not on file    Relationship status: Not on file  . Intimate partner violence:    Fear of current or ex partner: Not on file    Emotionally abused: Not on file    Physically abused: Not on file    Forced sexual activity: Not on file  Other Topics Concern  . Not on file  Social History Narrative   Married.   2 children, 5 grandchildren.   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   Enjoys puzzles, sewing, traveling, reading.     Medications:   Current Outpatient Medications on File Prior to Visit  Medication Sig Dispense Refill  . acetaminophen (TYLENOL) 500 MG tablet Take 1,000 mg by mouth every 6 (six) hours as needed for mild pain or headache.     [name]  amLODipine (NORVASC) 5 MG tablet TAKE 1 TABLET BY MOUTH EVERY DAY FOR BLOOD PRESSURE (Patient taking differently: Take 5 mg by mouth daily. ) 90 tablet 1  . atorvastatin (LIPITOR) 40 MG tablet Take 1 tablet (40 mg total) by mouth at bedtime. For cholesterol. 90 tablet 3  . CALCIUM CARBONATE-VITAMIN D PO Take 1 tablet by mouth daily.     . clopidogrel (PLAVIX) 75 MG tablet Take 1 tablet (75 mg total) by mouth daily. For stroke prevention. 90 tablet 3  . CRANBERRY PO Take 1 capsule by mouth every Monday, Wednesday, and Friday.     . ESBRIET 267 MG CAPS TAKE 3 CAPSULES BY MOUTH THREE TIMES DAILY WITH FOOD (Patient taking differently: Take 801 mg by mouth 3 (three) times daily. ) 270 capsule 3  .  levothyroxine (SYNTHROID, LEVOTHROID) 88 MCG tablet Take 1 tablet (88 mcg total) by mouth daily before breakfast. 90 tablet 1  . loratadine (CLARITIN) 10 MG tablet Take 10 mg by mouth daily.     . metoprolol succinate (TOPROL-XL) 25 MG 24 hr tablet TAKE 1/2 TABLET BY MOUTH EVERY DAY (Patient taking differently: Take 12.5 mg by mouth daily. ) 45 tablet 1  . Multiple Vitamins-Minerals (CENTRUM SILVER PO) Take 1 tablet by mouth daily.    [name] omeprazole (PRILOSEC) 20 MG capsule TAKE 1 CAPSULE (20 MG TOTAL) BY MOUTH DAILY. (Patient taking differently: Take 20 mg by mouth daily before breakfast. ) 90 capsule 1   No current facility-administered medications on file prior to visit.     Allergies:   Allergies  Allergen Reactions  . Dilaudid [Hydromorphone] Nausea And Vomiting  . Crestor [Rosuvastatin Calcium] Other (See Comments)    Myalgias, muscle weakness    Physical Exam General: well developed, well nourished pleasant elderly Caucasian lady, seated, in no evident distress Head: head normocephalic and atraumatic.   Neck: supple with no carotid or supraclavicular bruits Cardiovascular: regular rate and rhythm, soft ejection systolic murmur. Musculoskeletal: no deformity Skin:  no rash/petichiae Vascular:   Normal pulses all extremities  Neurologic Exam Mental Status: Awake and fully alert. Oriented to place and time. Recent and remote memory intact. Attention span, concentration and fund of knowledge appropriate. Mood and affect appropriate.  Cranial Nerves: Pupils equal, briskly reactive to light. Extraocular movements full without nystagmus. Visual fields full to confrontation. Hearing intact. Facial sensation intact. Face, tongue, palate moves normally and symmetrically.  Motor: Normal bulk and tone. Normal strength in all tested extremity muscles. Sensory.: intact to touch , pinprick , position and vibratory sensation.  Coordination: Rapid alternating movements normal in all extremities. Finger-to-nose and heel-to-shin performed accurately bilaterally. Gait and Station: Arises from chair without difficulty. Stance is normal. Gait demonstrates normal stride length and balance . Able to heel, toe and tandem walk with mild difficulty.  Reflexes: 1+ and symmetric. Toes downgoing.      ASSESSMENT: 78 year old Caucasian lady with recurrent transient episodes of bilateral leg weakness of unclear etiology.  Extensive evaluation in the past for compressive etiology and neurovascular work-up has been negative.  She returns today for follow-up visit and has been stable since prior visit without any recurrent bilateral leg weakness     PLAN: -Continue on Plavix and atorvastatin 40 mg for secondary stroke prevention -Continue to follow with PCP regarding HLD and HTN management -Continue to stay active and maintain a healthy diet -Continue to monitor blood pressure at home -Maintain strict control of hypertension with blood pressure goal below 130/90, diabetes with hemoglobin A1c goal below 6.5% and cholesterol with LDL cholesterol (bad cholesterol) goal below 70 mg/dL. I also advised the patient to eat a healthy diet with plenty of whole grains, cereals, fruits and vegetables, exercise regularly and  maintain ideal body weight.  Followup in the future with me in 6 months or call earlier if needed  Greater than 50% of time during this 25 minute visit was spent on counseling,explanation of diagnosis of lacunar infarct, reviewing risk factor management of HTN and HLD, discussion regarding recent studies and review of results, planning of further management, discussion with patient and family and coordination of care  Venancio Poisson, AGNP-BC  [facility] [address] [place], [place] [address]  Phone [phone] Fax [phone] Note: This document was prepared with digital dictation and possible smart phrase technology.  Any transcriptional errors that result from this process are unintentional.

## 2018-08-21 NOTE — Progress Notes (Signed)
I agree with the above plan

## 2018-10-08 ENCOUNTER — Other Ambulatory Visit: Payer: Self-pay | Admitting: Primary Care

## 2018-10-08 DIAGNOSIS — Z1231 Encounter for screening mammogram for malignant neoplasm of breast: Secondary | ICD-10-CM

## 2018-10-12 ENCOUNTER — Encounter: Payer: Self-pay | Admitting: Primary Care

## 2018-10-12 ENCOUNTER — Ambulatory Visit (INDEPENDENT_AMBULATORY_CARE_PROVIDER_SITE_OTHER): Payer: Medicare Other | Admitting: Primary Care

## 2018-10-12 VITALS — BP 140/80 | HR 85 | Temp 97.8°F

## 2018-10-12 DIAGNOSIS — R42 Dizziness and giddiness: Secondary | ICD-10-CM | POA: Diagnosis not present

## 2018-10-12 MED ORDER — MECLIZINE HCL 12.5 MG PO TABS: 12.5000 mg | ORAL_TABLET | Freq: Two times a day (BID) | ORAL | 0 refills | Status: DC | PRN

## 2018-10-12 NOTE — Progress Notes (Signed)
Subjective:    Patient ID: Tina Calderon, female    DOB: 11-Dec-1940, 78 y.o.   MRN: [id number]  HPI  Virtual Visit via Video Note  I connected with Tina Calderon on 10/12/18 at  9:40 AM EDT by a video enabled telemedicine application and verified that I am speaking with the correct person using two identifiers.  Location: Patient: Home Provider: Office   I discussed the limitations of evaluation and management by telemedicine and the availability of in person appointments. The patient expressed understanding and agreed to proceed.  We attempted to connect via video but the video failed. We had to conduct our visit via phone.   History of Present Illness:  Tina Calderon is a 78 year old female with a history of vertigo, hypertension, CVA, asthma, IPF who presents today with a chief complaint of dizziness.   She experienced "vertigo" yesterday with symptoms of feeling "swimmy headed", felt as though the room was spinning around her, nausea, and felt off balance. She's has a history of vertigo in the past and these symptoms feel very similar. She took some of her two year old Meclizine with improvement. Today she's feeling better but still has some residual symptoms. She is needing an updated prescription of Meclizine.   She denies unilateral weakness, chest pain, shortness of breath, visual disturbance, lower extremity weakness, falls, urinary symptoms. Her husband's home health nurse is with her now and checked her vitals.  BP Readings from Last 3 Encounters:  10/12/18 140/80  08/20/18 125/66  07/18/18 132/67      Observations/Objective:  Alert and oriented. Appears well, not sickly. No distress. Speaking in complete sentences.  Assessment and Plan:  See problem based charting.  Follow Up Instructions:  You may take Meclizine twice daily as needed for vertigo symptoms.  Try to stay hydrated with plenty of water.  You an also try the Elpey maneuver's if needed.   It was a pleasure to see you today!    I discussed the assessment and treatment plan with the patient. The patient was provided an opportunity to ask questions and all were answered. The patient agreed with the plan and demonstrated an understanding of the instructions.   The patient was advised to call back or seek an in-person evaluation if the symptoms worsen or if the condition fails to improve as anticipated.     [name], NP    Review of Systems  Constitutional: Negative for fever.  Eyes: Negative for visual disturbance.  Respiratory: Negative for shortness of breath.   Cardiovascular: Negative for chest pain.  Gastrointestinal: Positive for nausea. Negative for vomiting.  Genitourinary: Negative for dysuria and hematuria.  Neurological: Positive for dizziness. Negative for speech difficulty, weakness and headaches.       Past Medical History:  Diagnosis Date  . Anginal pain ([place])   . Cancer ([place])   . CHF (congestive heart failure) ([place]) 01/22/2012  . Chronic fatigue 07/15/2014  . CVA (cerebral vascular accident) ([place]) 04/18/2018  . GERD (gastroesophageal reflux disease) 05/12/2017  . Hypertension   . Hypothyroidism   . ILD (interstitial lung disease) ([place]) 02/04/2012  . IPF (idiopathic pulmonary fibrosis) ([place]) 2013  . Mild intermittent asthma 01/29/2016  . Pelvic relaxation   . Prolapse of bladder   . Shortness of breath   . Thyroid disease      Social History   Socioeconomic History  . Marital status: Married    Spouse name: Not on file  .  Number of children: Not on file  . Years of education: Not on file  . Highest education level: Not on file  Occupational History  . Not on file  Social Needs  . Financial resource strain: Not on file  . Food insecurity:    Worry: Not on file    Inability: Not on file  . Transportation needs:    Medical: Not on file    Non-medical: Not on file  Tobacco Use  . Smoking status: Never Smoker  . Smokeless  tobacco: Never Used  Substance and Sexual Activity  . Alcohol use: No  . Drug use: No  . Sexual activity: Never    Birth control/protection: Post-menopausal, Surgical    Comment: HYST  Lifestyle  . Physical activity:    Days per week: Not on file    Minutes per session: Not on file  . Stress: Not on file  Relationships  . Social connections:    Talks on phone: Not on file    Gets together: Not on file    Attends religious service: Not on file    Active member of club or organization: Not on file    Attends meetings of clubs or organizations: Not on file    Relationship status: Not on file  . Intimate partner violence:    Fear of current or ex partner: Not on file    Emotionally abused: Not on file    Physically abused: Not on file    Forced sexual activity: Not on file  Other Topics Concern  . Not on file  Social History Narrative   Married.   2 children, 5 grandchildren.   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   Enjoys puzzles, sewing, traveling, reading.     Past Surgical History:  Procedure Laterality Date  . ANTERIOR AND POSTERIOR REPAIR  12/05/2011   Procedure: ANTERIOR (CYSTOCELE) AND POSTERIOR REPAIR (RECTOCELE);  Surgeon: [name], MD;  Location: [facility];  Service: Gynecology;  Laterality: N/A;  with cysto  . Bladder tact  15 years ago  . LEFT HEART CATHETERIZATION WITH CORONARY ANGIOGRAM N/A 01/22/2012   Procedure: LEFT HEART CATHETERIZATION WITH CORONARY ANGIOGRAM;  Surgeon: [name], MD;  Location: [facility];  Service: Cardiovascular;  Laterality: N/A;  . NECK SURGERY  2010  . SHOULDER SURGERY    . thyroid disease    . THYROID LOBECTOMY Left 07/17/2018   Procedure: LEFT THYROID LOBECTOMY;  Surgeon: [name], MD;  Location: [facility];  Service: General;  Laterality: Left;  . TONSILLECTOMY    . VAGINAL HYSTERECTOMY  15 years ago    Family History  Problem Relation Age of Onset  . Thyroid disease Mother   . Hypertension Mother   . Coronary artery  disease Mother   . Dementia Mother   . Stroke Mother   . Aneurysm Father   . Pulmonary fibrosis Sister   . Stroke Other   . Sleep apnea Other   . Cancer Brother        LIVER  . Pulmonary fibrosis Brother     Allergies  Allergen Reactions  . Dilaudid [Hydromorphone] Nausea And Vomiting  . Crestor [Rosuvastatin Calcium] Other (See Comments)    Myalgias, muscle weakness    Current Outpatient Medications on File Prior to Visit  Medication Sig Dispense Refill  . acetaminophen (TYLENOL) 500 MG tablet Take 1,000 mg by mouth every 6 (six) hours as needed for mild pain or headache.     [name] amLODipine (NORVASC)  5 MG tablet TAKE 1 TABLET BY MOUTH EVERY DAY FOR BLOOD PRESSURE (Patient taking differently: Take 5 mg by mouth daily. ) 90 tablet 1  . atorvastatin (LIPITOR) 40 MG tablet Take 1 tablet (40 mg total) by mouth at bedtime. For cholesterol. 90 tablet 3  . CALCIUM CARBONATE-VITAMIN D PO Take 1 tablet by mouth daily.     . clopidogrel (PLAVIX) 75 MG tablet Take 1 tablet (75 mg total) by mouth daily. For stroke prevention. 90 tablet 3  . CRANBERRY PO Take 1 capsule by mouth every Monday, Wednesday, and Friday.     . ESBRIET 267 MG CAPS TAKE 3 CAPSULES BY MOUTH THREE TIMES DAILY WITH FOOD (Patient taking differently: Take 801 mg by mouth 3 (three) times daily. ) 270 capsule 3  . levothyroxine (SYNTHROID, LEVOTHROID) 100 MCG tablet Take 100 mcg by mouth daily before breakfast.    . loratadine (CLARITIN) 10 MG tablet Take 10 mg by mouth daily.     . metoprolol succinate (TOPROL-XL) 25 MG 24 hr tablet TAKE 1/2 TABLET BY MOUTH EVERY DAY (Patient taking differently: Take 12.5 mg by mouth daily. ) 45 tablet 1  . Multiple Vitamins-Minerals (CENTRUM SILVER PO) Take 1 tablet by mouth daily.    [name] omeprazole (PRILOSEC) 20 MG capsule TAKE 1 CAPSULE (20 MG TOTAL) BY MOUTH DAILY. (Patient taking differently: Take 20 mg by mouth daily before breakfast. ) 90 capsule 1   No current facility-administered  medications on file prior to visit.     BP 140/80   Pulse 85   Temp 97.8 F (36.6 C)   SpO2 93%    Objective:   Physical Exam  Constitutional: She is oriented to person, place, and time.  Respiratory: Effort normal.  Neurological: She is alert and oriented to person, place, and time.  Skin: Skin is warm and dry.  Psychiatric: She has a normal mood and affect.           Assessment & Plan:

## 2018-10-12 NOTE — Assessment & Plan Note (Signed)
History of in the past, symptoms now feel similar. Overall better today. Based off of HPI, and given that she's feeling better and that symptoms have improved with Meclizine we will have her rest and presume vertigo. She has no alarm signs or suspicious symptoms for CVA.  New Rx provided for Meclizine. Discussed to hydrate.  She will update if symptoms worsen.

## 2018-10-12 NOTE — Patient Instructions (Signed)
You may take Meclizine twice daily as needed for vertigo symptoms.  Try to stay hydrated with plenty of water.  You an also try the Epley maneuver's if needed.  It was a pleasure to see you today!  How to Perform the Epley Maneuver The Epley maneuver is an exercise that relieves symptoms of vertigo. Vertigo is the feeling that you or your surroundings are moving when they are not. When you feel vertigo, you may feel like the room is spinning and have trouble walking. Dizziness is a little different than vertigo. When you are dizzy, you may feel unsteady or light-headed. You can do this maneuver at home whenever you have symptoms of vertigo. You can do it up to 3 times a day until your symptoms go away. Even though the Epley maneuver may relieve your vertigo for a few weeks, it is possible that your symptoms will return. This maneuver relieves vertigo, but it does not relieve dizziness. What are the risks? If it is done correctly, the Epley maneuver is considered safe. Sometimes it can lead to dizziness or nausea that goes away after a short time. If you develop other symptoms, such as changes in vision, weakness, or numbness, stop doing the maneuver and call your health care provider. How to perform the Epley maneuver 1. Sit on the edge of a bed or table with your back straight and your legs extended or hanging over the edge of the bed or table. 2. Turn your head halfway toward the affected ear or side. 3. Lie backward quickly with your head turned until you are lying flat on your back. You may want to position a pillow under your shoulders. 4. Hold this position for 30 seconds. You may experience an attack of vertigo. This is normal. 5. Turn your head to the opposite direction until your unaffected ear is facing the floor. 6. Hold this position for 30 seconds. You may experience an attack of vertigo. This is normal. Hold this position until the vertigo stops. 7. Turn your whole body to the same  side as your head. Hold for another 30 seconds. 8. Sit back up. You can repeat this exercise up to 3 times a day. Follow these instructions at home:  After doing the Epley maneuver, you can return to your normal activities.  Ask your health care provider if there is anything you should do at home to prevent vertigo. He or she may recommend that you: ? Keep your head raised (elevated) with two or more pillows while you sleep. ? Do not sleep on the side of your affected ear. ? Get up slowly from bed. ? Avoid sudden movements during the day. ? Avoid extreme head movement, like looking up or bending over. Contact a health care provider if:  Your vertigo gets worse.  You have other symptoms, including: ? Nausea. ? Vomiting. ? Headache. Get help right away if:  You have vision changes.  You have a severe or worsening headache or neck pain.  You cannot stop vomiting.  You have new numbness or weakness in any part of your body. Summary  Vertigo is the feeling that you or your surroundings are moving when they are not.  The Epley maneuver is an exercise that relieves symptoms of vertigo.  If the Epley maneuver is done correctly, it is considered safe. You can do it up to 3 times a day. This information is not intended to replace advice given to you by your health care provider. Make  sure you discuss any questions you have with your health care provider. Document Released: 06/01/2013 Document Revised: 04/16/2016 Document Reviewed: 04/16/2016 Elsevier Interactive Patient Education  2019 [employer].

## 2018-10-16 ENCOUNTER — Other Ambulatory Visit: Payer: Self-pay | Admitting: Primary Care

## 2018-10-16 DIAGNOSIS — R000 Tachycardia, unspecified: Secondary | ICD-10-CM

## 2018-10-26 ENCOUNTER — Other Ambulatory Visit: Payer: Self-pay | Admitting: Emergency Medicine

## 2018-11-03 ENCOUNTER — Encounter: Payer: Self-pay | Admitting: Primary Care

## 2018-11-03 ENCOUNTER — Other Ambulatory Visit: Payer: Self-pay

## 2018-11-03 ENCOUNTER — Ambulatory Visit: Payer: Medicare Other | Admitting: Primary Care

## 2018-11-03 VITALS — BP 136/80 | HR 98 | Temp 97.8°F | Ht 62.0 in | Wt 128.5 lb

## 2018-11-03 DIAGNOSIS — E890 Postprocedural hypothyroidism: Secondary | ICD-10-CM

## 2018-11-03 LAB — TSH: TSH: 3.05 u[IU]/mL (ref 0.35–4.50)

## 2018-11-03 NOTE — Patient Instructions (Signed)
Stop by the lab prior to leaving today. I will notify you of your results once received.   Be sure to take your levothyroxine (thyroid medication) every morning on an empty stomach with water only. No food or other medications for 30 minutes. No heartburn medication, iron pills, calcium, vitamin D, or magnesium pills within four hours of taking levothyroxine.   It was a pleasure to see you today!

## 2018-11-03 NOTE — Assessment & Plan Note (Signed)
Postoperative. S/P left thyroidectomy in February 2020. Recent TSH of 4.59 with levothyroxine 100 mcg in early May 2020. Repeat TSH pending today, adjust levothyroxine appropriately.   Asymptomatic.

## 2018-11-03 NOTE — Progress Notes (Signed)
Subjective:    Patient ID: Tina Calderon, female    DOB: 1940-08-20, 78 y.o.   MRN: [id number]  HPI  Tina Calderon is a 78 year old female who presents today for follow up of hypothyroidism.   She is currently s/p left partial thyroidectomy from February 2020. She is currently managed on levothyroxine 100 mcg which was increased from 88 mcg at that time. She takes her levothyroxine every morning on an empty stomach with water only, no food or other medications for at least 30 minutes. She will take multi-vitamins within 4 hours.   Recent TSH of 4.59 from 10/14/18.   BP Readings from Last 3 Encounters:  11/03/18 136/80  10/12/18 140/80  08/20/18 125/66     Review of Systems  Constitutional:       Denies increased fatigue  Respiratory:       Denies increased shortness of breath  Cardiovascular: Negative for chest pain and palpitations.  Endocrine: Negative for cold intolerance.  Neurological: Negative for dizziness and headaches.       Past Medical History:  Diagnosis Date  . Anginal pain ([place])   . Cancer ([place])   . CHF (congestive heart failure) ([place]) 01/22/2012  . Chronic fatigue 07/15/2014  . CVA (cerebral vascular accident) ([place]) 04/18/2018  . GERD (gastroesophageal reflux disease) 05/12/2017  . Hypertension   . Hypothyroidism   . ILD (interstitial lung disease) ([place]) 02/04/2012  . IPF (idiopathic pulmonary fibrosis) ([place]) 2013  . Mild intermittent asthma 01/29/2016  . Pelvic relaxation   . Prolapse of bladder   . Shortness of breath   . Thyroid disease      Social History   Socioeconomic History  . Marital status: Married    Spouse name: Not on file  . Number of children: Not on file  . Years of education: Not on file  . Highest education level: Not on file  Occupational History  . Not on file  Social Needs  . Financial resource strain: Not on file  . Food insecurity:    Worry: Not on file    Inability: Not on file  . Transportation needs:    Medical: Not  on file    Non-medical: Not on file  Tobacco Use  . Smoking status: Never Smoker  . Smokeless tobacco: Never Used  Substance and Sexual Activity  . Alcohol use: No  . Drug use: No  . Sexual activity: Never    Birth control/protection: Post-menopausal, Surgical    Comment: HYST  Lifestyle  . Physical activity:    Days per week: Not on file    Minutes per session: Not on file  . Stress: Not on file  Relationships  . Social connections:    Talks on phone: Not on file    Gets together: Not on file    Attends religious service: Not on file    Active member of club or organization: Not on file    Attends meetings of clubs or organizations: Not on file    Relationship status: Not on file  . Intimate partner violence:    Fear of current or ex partner: Not on file    Emotionally abused: Not on file    Physically abused: Not on file    Forced sexual activity: Not on file  Other Topics Concern  . Not on file  Social History Narrative   Married.   2 children, 5 grandchildren.   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   Enjoys puzzles,  sewing, traveling, reading.     Past Surgical History:  Procedure Laterality Date  . ANTERIOR AND POSTERIOR REPAIR  12/05/2011   Procedure: ANTERIOR (CYSTOCELE) AND POSTERIOR REPAIR (RECTOCELE);  Surgeon: [name], MD;  Location: [facility];  Service: Gynecology;  Laterality: N/A;  with cysto  . Bladder tact  15 years ago  . LEFT HEART CATHETERIZATION WITH CORONARY ANGIOGRAM N/A 01/22/2012   Procedure: LEFT HEART CATHETERIZATION WITH CORONARY ANGIOGRAM;  Surgeon: [name], MD;  Location: [facility] CATH LAB;  Service: Cardiovascular;  Laterality: N/A;  . NECK SURGERY  2010  . SHOULDER SURGERY    . thyroid disease    . THYROID LOBECTOMY Left 07/17/2018   Procedure: LEFT THYROID LOBECTOMY;  Surgeon: [name], MD;  Location: [facility];  Service: General;  Laterality: Left;  . TONSILLECTOMY    . VAGINAL HYSTERECTOMY  15 years ago    Family History   Problem Relation Age of Onset  . Thyroid disease Mother   . Hypertension Mother   . Coronary artery disease Mother   . Dementia Mother   . Stroke Mother   . Aneurysm Father   . Pulmonary fibrosis Sister   . Stroke Other   . Sleep apnea Other   . Cancer Brother        LIVER  . Pulmonary fibrosis Brother     Allergies  Allergen Reactions  . Dilaudid [Hydromorphone] Nausea And Vomiting  . Crestor [Rosuvastatin Calcium] Other (See Comments)    Myalgias, muscle weakness    Current Outpatient Medications on File Prior to Visit  Medication Sig Dispense Refill  . acetaminophen (TYLENOL) 500 MG tablet Take 1,000 mg by mouth every 6 (six) hours as needed for mild pain or headache.     [name] amLODipine (NORVASC) 5 MG tablet TAKE 1 TABLET BY MOUTH EVERY DAY FOR BLOOD PRESSURE (Patient taking differently: Take 5 mg by mouth daily. ) 90 tablet 1  . atorvastatin (LIPITOR) 40 MG tablet Take 1 tablet (40 mg total) by mouth at bedtime. For cholesterol. 90 tablet 3  . CALCIUM CARBONATE-VITAMIN D PO Take 1 tablet by mouth daily.     . clopidogrel (PLAVIX) 75 MG tablet Take 1 tablet (75 mg total) by mouth daily. For stroke prevention. 90 tablet 3  . CRANBERRY PO Take 1 capsule by mouth every Monday, Wednesday, and Friday.     . ESBRIET 267 MG CAPS TAKE 3 CAPSULES BY MOUTH THREE TIMES DAILY WITH FOOD (Patient taking differently: Take 801 mg by mouth 3 (three) times daily. ) 270 capsule 3  . levothyroxine (SYNTHROID, LEVOTHROID) 100 MCG tablet Take 100 mcg by mouth daily before breakfast.    . loratadine (CLARITIN) 10 MG tablet Take 10 mg by mouth daily.     . meclizine (ANTIVERT) 12.5 MG tablet Take 1 tablet (12.5 mg total) by mouth 2 (two) times daily as needed for dizziness. 30 tablet 0  . metoprolol succinate (TOPROL-XL) 25 MG 24 hr tablet TAKE 1/2 TABLET BY MOUTH EVERY DAY 45 tablet 1  . Multiple Vitamins-Minerals (CENTRUM SILVER PO) Take 1 tablet by mouth daily.    [name] omeprazole (PRILOSEC) 20 MG  capsule TAKE 1 CAPSULE (20 MG TOTAL) BY MOUTH DAILY. 90 capsule 0   No current facility-administered medications on file prior to visit.     BP 136/80   Pulse 98   Temp 97.8 F (36.6 C) (Oral)   Ht _0  (1.575 m)   Wt 128 lb 8 oz (58.3 kg)  SpO2 94%   BMI 23.50 kg/m    Objective:   Physical Exam  Constitutional: She appears well-nourished.  Neck: Neck supple. No thyromegaly present.  No obvious thyroid nodules or masses  Cardiovascular: Normal rate and regular rhythm.  Respiratory: Effort normal and breath sounds normal.  Skin: Skin is warm and dry.           Assessment & Plan:

## 2018-11-04 MED ORDER — LEVOTHYROXINE SODIUM 100 MCG PO TABS: ORAL_TABLET | ORAL | 1 refills | Status: DC

## 2018-11-25 ENCOUNTER — Other Ambulatory Visit: Payer: Self-pay | Admitting: Primary Care

## 2018-11-25 DIAGNOSIS — I10 Essential (primary) hypertension: Secondary | ICD-10-CM

## 2018-12-03 ENCOUNTER — Ambulatory Visit (INDEPENDENT_AMBULATORY_CARE_PROVIDER_SITE_OTHER): Payer: Medicare Other | Admitting: Emergency Medicine

## 2018-12-03 ENCOUNTER — Encounter: Payer: Self-pay | Admitting: Emergency Medicine

## 2018-12-03 ENCOUNTER — Other Ambulatory Visit: Payer: Self-pay

## 2018-12-03 VITALS — BP 130/72 | HR 91 | Wt 130.0 lb

## 2018-12-03 DIAGNOSIS — J84112 Idiopathic pulmonary fibrosis: Secondary | ICD-10-CM | POA: Diagnosis not present

## 2018-12-03 DIAGNOSIS — J849 Interstitial pulmonary disease, unspecified: Secondary | ICD-10-CM | POA: Diagnosis not present

## 2018-12-03 LAB — CBC WITH DIFFERENTIAL/PLATELET
Basophils Absolute: 0.1 10*3/uL (ref 0.0–0.1)
Basophils Relative: 0.8 % (ref 0.0–3.0)
Eosinophils Absolute: 0.3 10*3/uL (ref 0.0–0.7)
Eosinophils Relative: 3.8 % (ref 0.0–5.0)
HCT: 29.9 % — ABNORMAL LOW (ref 36.0–46.0)
Hemoglobin: 9.5 g/dL — ABNORMAL LOW (ref 12.0–15.0)
Lymphocytes Relative: 21.8 % (ref 12.0–46.0)
Lymphs Abs: 1.7 10*3/uL (ref 0.7–4.0)
MCHC: 31.6 g/dL (ref 30.0–36.0)
MCV: 75.8 fl — ABNORMAL LOW (ref 78.0–100.0)
Monocytes Absolute: 0.9 10*3/uL (ref 0.1–1.0)
Monocytes Relative: 11.9 % (ref 3.0–12.0)
Neutro Abs: 4.8 10*3/uL (ref 1.4–7.7)
Neutrophils Relative %: 61.7 % (ref 43.0–77.0)
Platelets: 255 10*3/uL (ref 150.0–400.0)
RBC: 3.94 Mil/uL (ref 3.87–5.11)
RDW: 16.5 % — ABNORMAL HIGH (ref 11.5–15.5)
WBC: 7.8 10*3/uL (ref 4.0–10.5)

## 2018-12-03 LAB — HEPATIC FUNCTION PANEL
ALT: 14 U/L (ref 0–35)
AST: 20 U/L (ref 0–37)
Albumin: 4.1 g/dL (ref 3.5–5.2)
Alkaline Phosphatase: 56 U/L (ref 39–117)
Bilirubin, Direct: 0.1 mg/dL (ref 0.0–0.3)
Total Bilirubin: 0.3 mg/dL (ref 0.2–1.2)
Total Protein: 6.8 g/dL (ref 6.0–8.3)

## 2018-12-03 LAB — TSH: TSH: 0.79 u[IU]/mL (ref 0.35–4.50)

## 2018-12-03 NOTE — Progress Notes (Signed)
Subjective:    Patient ID: Tina Calderon, female    DOB: 1940-07-30, 78 y.o.   MRN: [id number]  HPI  ROV 12/01/17 --this follow-up visit for patient with a history of idiopathic interstitial lung disease, some coexisting obstruction based on her spirometry.  She also has a hiatal hernia with GERD and chronic cough.  We have managed her on pirfenidone.  At our last visit she noted that she is had some decreased functional capacity, more dyspnea with exertion. She is still having decreased energy, more fatigue with activity. We checked CBC and TSH, both reassuring. She keeps a slight cough, added chlorpheniramine to loratadine. Remains on omeprazole.    She underwent a 6-minute walk test today and was able to walk 462 m without desaturation.  Her last in 04/2016 was 49mRepeat CT chest from 11/28/2017 reviewed and shows stable interstitial changes compared with her most recent from 2017.  There is patchy groundglass attenuation with associated septal thickening and peripheral bronchial ectasis without overt honeycomb change.  No effusion, no suspicious nodules.   ROV 06/18/18 --[name] is 89 followed for history of idiopathic interstitial lung disease and some coexisting obstruction noted on spirometry.  She also has a hiatal hernia with GERD and some associated chronic cough.  We have been managing her on pirfenidone - no side effects.  She is on omeprazole, loratadine, chlorpheniramine.  Since our last visit she was hospitalized for a stroke, has some residual word-finding trouble. She is scheduled for nerve conduction studies after some falls and leg weakness. She is also being evaluated for possible thyroid surgery with Dr. GHarlow Asa She had a CT chest done 01/12/18 that I reviewed, shows no real change in NSIP pattern, large HH, no new infiltrates. Overall reports that breathing is stable, not limiting.   ROV 12/03/2018 --pleasant 78year old woman with mixed lung disease.  She has mild obstruction and  restriction in the setting of a hiatal hernia and idiopathic interstitial lung disease that would characterizes IPF.  We have been managing her on pirfenidone.  Also with a history of GERD on Prilosec, allergic rhinitis on loratadine. Last LFT in 05/2018 were normal. Last Ct chest was 01/2018. She is having progressive exertional SOB compared with last time. She has to rest doing housework, taking out the trash. She has to stop to rest. Not on BD's, didn't benefit. She had thyroid sgy in 07/2018, just had her synthroid increased in 07/2018. TSH was 3.05 5/26.     SIX MIN WALK 12/01/2017 05/01/2016 10/29/2013 12/04/2012 02/21/2012  Medications Levothyroxine, Esbriet, Loratdine, and Omeprazole all taken at 6:45 AM Synthroid 838m, Claritin 101mPrilosec 78m22maken at 0630. Esbriet 801mg46men at 0800 - - -  Supplimental Oxygen during Test? (L/min) _0   Laps 9 9 - - -  Partial Lap (in Meters) 30 12 - - -  Baseline BP (sitting) 120/78 140/82 - - -  Baseline Heartrate 101 100 - - -  Baseline Dyspnea (Borg Scale) 1 3 - - -  Baseline Fatigue (Borg Scale) 1 2 - - -  Baseline SPO2 98 97 - - -  BP (sitting) 140/76 192/98 - - -  Heartrate 129 139 - - -  Dyspnea (Borg Scale) 3 3 - - -  Fatigue (Borg Scale) 3 4 - - -  SPO2 96 92 - - -  BP (sitting) 132/78 170/92 - - -  Heartrate 106 113 - - -  SPO2 99 98 - - -  Stopped or Paused before Six Minutes No No - - -  Interpretation Hip pain - - - -  Distance Completed 462 444 - - -  Tech Comments: patient was very anxious as well as emotional before test. Pt ambulated with a steady gait.  - pt only completed 2 laps, stated she was "very short winded"//ldc Pt completed the walk with little difficulties...increased sob with increased heart rate.        Objective:   Physical Exam Vitals:   12/03/18 1050  BP: 130/72  Pulse: 91  SpO2: 97%  Weight: 130 lb (59 kg)    GEN: A/Ox3; pleasant , NAD, well nourished    HEENT: OP clear, no  congestion  NECK:  No stridor.    RESP  bibasilar inspiratory crackles, no squeaks   CARD:  RRR, no m/r/g  , no peripheral edema, pulses intact, no cyanosis or clubbing.  Musco: Warm bil, no deformities or joint swelling noted.   Neuro: alert, no focal deficits noted. She c/o word finding difficulty but no evidence for this today   Skin: Warm, no lesions or rashes       Assessment & Plan:  IPF (idiopathic pulmonary fibrosis) ([place]) She has had progressive dyspnea with exertion.  Quite noticeable when she tried to do her daily housework.  No wheezing.  She is never responded to bronchodilators in the past we could reconsider going forward.  I would like to evaluate the ILD first.  We need to assess his 6-minute walk, PFT, repeat her high-resolution CT chest.  I will check a screening TSH, CBC.  She needs LFT since she is on pirfenidone.  We will plan to continue the pirfenidone as ordered for now.  Walking oximetry today to ensure that she does not have occult desaturation.  If so we will initiate oxygen.  Mild intermittent asthma Consider addition BD's going forward depending on her PFT results, the evaluation of her ILD.   Tina Apo, MD, PhD 12/03/2018, 11:19 AM [facility] [phone] or if no answer [phone]

## 2018-12-03 NOTE — Assessment & Plan Note (Signed)
Consider addition BD's going forward depending on her PFT results, the evaluation of her ILD.

## 2018-12-03 NOTE — Assessment & Plan Note (Signed)
She has had progressive dyspnea with exertion.  Quite noticeable when she tried to do her daily housework.  No wheezing.  She is never responded to bronchodilators in the past we could reconsider going forward.  I would like to evaluate the ILD first.  We need to assess his 6-minute walk, PFT, repeat her high-resolution CT chest.  I will check a screening TSH, CBC.  She needs LFT since she is on pirfenidone.  We will plan to continue the pirfenidone as ordered for now.  Walking oximetry today to ensure that she does not have occult desaturation.  If so we will initiate oxygen.

## 2018-12-03 NOTE — Patient Instructions (Addendum)
Walking oximetry on RA today We will plan to repeat your Ct chest in August 2020 Lab work today Continue pirfenidone as you have been taking it We will arrange for a 6 minute walk Follow with Dr Lamonte Sakai in August after the CT chest to review. We will perform PFT on the same day.

## 2018-12-04 ENCOUNTER — Other Ambulatory Visit: Payer: Self-pay

## 2018-12-04 ENCOUNTER — Ambulatory Visit
Admission: RE | Admit: 2018-12-04 | Discharge: 2018-12-04 | Disposition: A | Discharge status: Home / Self Care | Payer: Medicare Other | Source: Ambulatory Visit | Attending: Primary Care | Admitting: Primary Care

## 2018-12-04 DIAGNOSIS — Z1231 Encounter for screening mammogram for malignant neoplasm of breast: Secondary | ICD-10-CM

## 2018-12-07 ENCOUNTER — Telehealth: Payer: Self-pay | Admitting: Cardiology

## 2018-12-07 NOTE — Telephone Encounter (Signed)
COVID-19 Pre-Screening Questions:   In the past 7 to 10 days have you had a cough,  shortness of breath, headache, congestion, fever (100 or greater) body aches, chills, sore throat, or sudden loss of taste or sense of smell? no  Have you been around anyone with known Covid 19.no  Have you been around anyone who is awaiting Covid 19 test results in the past 7 to 10 days? no Have you been around anyone who has been exposed to Covid 19, or has mentioned symptoms of Covid 19 within the past 7 to 10 days? no If you have any concerns/questions about symptoms patients report during screening (either on the phone or at threshold). Contact the provider seeing the patient or DOD for further guidance.  If neither are available contact a member of the leadership team.   I called pt to confirm her 12-08-18 appt with Dr Harrell Gave.

## 2018-12-08 ENCOUNTER — Other Ambulatory Visit: Payer: Self-pay

## 2018-12-08 ENCOUNTER — Telehealth: Payer: Self-pay | Admitting: Radiology

## 2018-12-08 ENCOUNTER — Ambulatory Visit (INDEPENDENT_AMBULATORY_CARE_PROVIDER_SITE_OTHER): Payer: Medicare Other | Admitting: Cardiology

## 2018-12-08 VITALS — HR 87 | Temp 98.1°F | Ht 62.5 in | Wt 129.0 lb

## 2018-12-08 DIAGNOSIS — R002 Palpitations: Secondary | ICD-10-CM | POA: Diagnosis not present

## 2018-12-08 DIAGNOSIS — R0609 Other forms of dyspnea: Secondary | ICD-10-CM | POA: Diagnosis not present

## 2018-12-08 DIAGNOSIS — R072 Precordial pain: Secondary | ICD-10-CM

## 2018-12-08 DIAGNOSIS — Z7189 Other specified counseling: Secondary | ICD-10-CM

## 2018-12-08 DIAGNOSIS — R000 Tachycardia, unspecified: Secondary | ICD-10-CM | POA: Diagnosis not present

## 2018-12-08 NOTE — Patient Instructions (Signed)
Medication Instructions:  Your Physician recommend you continue on your current medication as directed.    If you need a refill on your cardiac medications before your next appointment, please call your pharmacy.   Lab work: None  Testing/Procedures: Our physician has recommended that you wear an 3 DAY ZIO-PATCH monitor. The Zio patch cardiac monitor continuously records heart rhythm data for up to 14 days, this is for patients being evaluated for multiple types heart rhythms. For the first 24 hours post application, please avoid getting the Zio monitor wet in the shower or by excessive sweating during exercise. After that, feel free to carry on with regular activities. Keep soaps and lotions away from the ZIO XT Patch.   This will be placed at our [facility] location - [address], [address].      Follow-Up: At [facility], you and your health needs are our priority.  As part of our continuing mission to provide you with exceptional heart care, we have created designated Provider Care Teams.  These Care Teams include your primary Cardiologist (physician) and Advanced Practice Providers (APPs -  Physician Assistants and Nurse Practitioners) who all work together to provide you with the care you need, when you need it. You will need a follow up appointment in 2 months.  Please call our office 2 months in advance to schedule this appointment.  You may see Buford Dresser, MD or one of the following Advanced Practice Providers on your designated Care Team:   Rosaria Ferries, PA-C . Jory Sims, DNP, ANP

## 2018-12-08 NOTE — Progress Notes (Signed)
Cardiology Office Note:    Date:  12/08/2018   ID:  Tina Calderon, Tina Calderon, Tina Calderon, MRN [id number]  PCP:  [name], NP  Cardiologist:  Buford Dresser, MD PhD  Referring MD: [name], NP   CC: Follow up  History of Present Illness:    Tina Calderon is a 78 y.o. female with a hx of pulmonary fibrosis who is seen in follow up for evaluation and management of tachycardia, fatigue, exertional intolerance.  Summary from initial visit 01/22/18: She presented to [facility] ER on 01/12/18 with palpitations and shortness of breath. At pulmonary rehab that morning, her heart rate was noted to be 120 bpm (patient states this was at rest). In the ER, her ECG showed sinus tachycardia at 104 bpm, CT was negative for PE, negative troponin. She feels exhausted with basic everyday activities. For instance, she cooks breakfast every morning for herself and her husband. By the time she is done, she has no appetite. Often she has to lie down to rest for a time after. She is supposed to do about 50 minutes of near continuous cardiovascular activity at pulmonary rehab, and she is completely exhausted by the end of this. She endorses good food and fluid intake despite her fatigue. No infectious symptoms or recent illnesses.  Today:  Staying safe with coronavirus, daughter is helping her out.  Has noticed that her HR is up, feels wiped from activity even making breakfast or taking out the garbage. Saw a HR of 141 just changing the sheets. Uses pulse ox to check. Usually between 80-90 bpm at rest. Isn't sure if it is irregular.   Also notes new chest pain. Like something is sitting on her. Occurs when she exerts herself. Lasts only briefly, goes away when she rest. Occurs with SOB, no n/v/diaphoresis. Not every time with exertion. No recent syncope since recovering from stroke in November. No palpitations.   Had appt with Dr. Lamonte Sakai 12/03/18, didn't drop O2 sats below 90 on RA walking 2  laps.   Past Medical History:  Diagnosis Date  . Anginal pain ([place])   . Cancer ([place])   . CHF (congestive heart failure) ([place]) 01/22/2012  . Chronic fatigue 07/15/2014  . CVA (cerebral vascular accident) ([place]) 04/18/2018  . GERD (gastroesophageal reflux disease) 05/12/2017  . Hypertension   . Hypothyroidism   . ILD (interstitial lung disease) ([place]) 02/04/2012  . IPF (idiopathic pulmonary fibrosis) ([place]) 2013  . Mild intermittent asthma 01/29/2016  . Pelvic relaxation   . Prolapse of bladder   . Shortness of breath   . Thyroid disease     Past Surgical History:  Procedure Laterality Date  . ANTERIOR AND POSTERIOR REPAIR  12/05/2011   Procedure: ANTERIOR (CYSTOCELE) AND POSTERIOR REPAIR (RECTOCELE);  Surgeon: [name], MD;  Location: [facility];  Service: Gynecology;  Laterality: N/A;  with cysto  . Bladder tact  15 years ago  . LEFT HEART CATHETERIZATION WITH CORONARY ANGIOGRAM N/A 01/22/2012   Procedure: LEFT HEART CATHETERIZATION WITH CORONARY ANGIOGRAM;  Surgeon: [name], MD;  Location: [facility] CATH LAB;  Service: Cardiovascular;  Laterality: N/A;  . NECK SURGERY  2010  . SHOULDER SURGERY    . thyroid disease    . THYROID LOBECTOMY Left 07/17/2018   Procedure: LEFT THYROID LOBECTOMY;  Surgeon: [name], MD;  Location: [facility];  Service: General;  Laterality: Left;  . TONSILLECTOMY    . VAGINAL HYSTERECTOMY  15 years ago    Current Medications: Current  Outpatient Medications on File Prior to Visit  Medication Sig  . acetaminophen (TYLENOL) 500 MG tablet Take 1,000 mg by mouth every 6 (six) hours as needed for mild pain or headache.   [name] amLODipine (NORVASC) 5 MG tablet TAKE 1 TABLET BY MOUTH EVERY DAY FOR BLOOD PRESSURE  . atorvastatin (LIPITOR) 40 MG tablet Take 1 tablet (40 mg total) by mouth at bedtime. For cholesterol.  [name] CALCIUM CARBONATE-VITAMIN D PO Take 1 tablet by mouth daily.   . clopidogrel (PLAVIX) 75 MG tablet Take 1 tablet (75 mg total) by mouth daily. For  stroke prevention.  [name] CRANBERRY PO Take 1 capsule by mouth every Monday, Wednesday, and Friday.   . ESBRIET 267 MG CAPS TAKE 3 CAPSULES BY MOUTH THREE TIMES DAILY WITH FOOD (Patient taking differently: Take 801 mg by mouth 3 (three) times daily. )  . levothyroxine (SYNTHROID) 100 MCG tablet Take 1 tablet by mouth every morning on an empty stomach. No food or other medications for 30 minutes.  [name] loratadine (CLARITIN) 10 MG tablet Take 10 mg by mouth daily.   . metoprolol succinate (TOPROL-XL) 25 MG 24 hr tablet TAKE 1/2 TABLET BY MOUTH EVERY DAY  . Multiple Vitamins-Minerals (CENTRUM SILVER PO) Take 1 tablet by mouth daily.  [name] omeprazole (PRILOSEC) 20 MG capsule TAKE 1 CAPSULE (20 MG TOTAL) BY MOUTH DAILY.   No current facility-administered medications on file prior to visit.      Allergies:   Dilaudid [hydromorphone] and Crestor [rosuvastatin calcium]   Social History   Socioeconomic History  . Marital status: Married    Spouse name: Not on file  . Number of children: Not on file  . Years of education: Not on file  . Highest education level: Not on file  Occupational History  . Not on file  Social Needs  . Financial resource strain: Not on file  . Food insecurity    Worry: Not on file    Inability: Not on file  . Transportation needs    Medical: Not on file    Non-medical: Not on file  Tobacco Use  . Smoking status: Never Smoker  . Smokeless tobacco: Never Used  Substance and Sexual Activity  . Alcohol use: No  . Drug use: No  . Sexual activity: Never    Birth control/protection: Post-menopausal, Surgical    Comment: HYST  Lifestyle  . Physical activity    Days per week: Not on file    Minutes per session: Not on file  . Stress: Not on file  Relationships  . Social Herbalist on phone: Not on file    Gets together: Not on file    Attends religious service: Not on file    Active member of club or organization: Not on file    Attends meetings of clubs or  organizations: Not on file    Relationship status: Not on file  Other Topics Concern  . Not on file  Social History Narrative   Married.   2 children, 5 grandchildren.   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   Enjoys puzzles, sewing, traveling, reading.      Family History: The patient's family history includes Aneurysm in her father; Cancer in her brother; Coronary artery disease in her mother; Dementia in her mother; Hypertension in her mother; Pulmonary fibrosis in her brother and sister; Sleep apnea in an other family member; Stroke in her mother and another family member; Thyroid disease in her mother.  ROS:  Please see the history of present illness.  Additional pertinent ROS: Review of Systems  Constitutional: Positive for malaise/fatigue. Negative for chills and fever.  HENT: Negative for ear pain and hearing loss.   Eyes: Negative for blurred vision and pain.  Respiratory: Positive for shortness of breath. Negative for hemoptysis, sputum production and wheezing.   Cardiovascular: Negative for chest pain, palpitations, orthopnea, claudication, leg swelling and PND.  Gastrointestinal: Negative for abdominal pain and blood in stool.  Genitourinary: Negative for dysuria and hematuria.  Musculoskeletal: Negative for falls and myalgias.  Skin: Negative for rash.  Neurological: Negative for focal weakness and loss of consciousness.  Endo/Heme/Allergies: Does not bruise/bleed easily.   EKGs/Labs/Other Studies Reviewed:    The following studies were reviewed today: MPI 04-05-2018  Nuclear stress EF: 68%.  Blood pressure demonstrated a normal response to exercise.  There was no ST segment deviation noted during stress.  The study is normal.  This is a low risk study.  The left ventricular ejection fraction is hyperdynamic (>65%).  Echo 04/19/18 - Left ventricle: The cavity size was normal. Wall thickness was   increased in a pattern of mild LVH. Systolic function was    vigorous. The estimated ejection fraction was in the range of 65%   to 70%. Wall motion was normal; there were no regional wall   motion abnormalities. Doppler parameters are consistent with   abnormal left ventricular relaxation (grade 1 diastolic   dysfunction). Doppler parameters are consistent with   indeterminate ventricular filling pressure. - Aortic valve: Mildly calcified annulus. Trileaflet. - Mitral valve: Mildly calcified annulus. - Left atrium: The atrium was mildly dilated. - Atrial septum: No defect or patent foramen ovale was identified. - Tricuspid valve: There was moderate regurgitation. - Pulmonary arteries: PA peak pressure: 63 mm Hg (S). - Pericardium, extracardiac: A trivial pericardial effusion was   identified.  Echo 01/31/18 Study Conclusions  - Left ventricle: The cavity size was normal. Wall thickness was   normal. Systolic function was normal. The estimated ejection   fraction was in the range of 60% to 65%. Wall motion was normal;   there were no regional wall motion abnormalities. Doppler   parameters are consistent with abnormal left ventricular   relaxation (grade 1 diastolic dysfunction). - Aortic valve: Mildly calcified annulus. Normal thickness   leaflets. Valve area (VTI): 1.83 cm^2. Valve area (Vmax): 2.06   cm^2. Valve area (Vmean): 1.74 cm^2. - Mitral valve: Mildly calcified annulus. Normal thickness leaflets   . - Left atrium: The atrium was severely dilated. - Right ventricle: The cavity size was mildly dilated. - Technically adequate study.  Elevated RA pressures, RV-RA gradient 47 mmHg.   Monitor 01/29/18 Ventricular tachycardia-nonsustained-up to 5 beats Atrial tachycardia-37 episodes    Symptoms of fluttering sinus rhythm;                              rare PAC                             Isolated PVC Symptoms of shortness of breath PACs/PVCs  Long term monitor reviewed. All of her patient triggered events are sinus rhythm,  occasionally tachycardia, rare isolated ectopy. She had one episode of 5 beats of NSVT without symptoms, and she had one brief episode of 3 beats of SVT. Rest are isolated atrial ectopy and rare PVCs.  CT angio chest 01/12/18  IMPRESSION: 1. No acute findings. No pulmonary embolism seen. No evidence of pneumonia or pulmonary edema. 2. 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 3. Large hiatal hernia, stable. 4. Cardiomegaly. 5. Coronary artery calcifications.  EKG:  EKG personally reviewed, most recent is 04/18/18 with SR, PRWP  Recent Labs: 04/18/2018: B Natriuretic Peptide 100.9; Magnesium 1.8 07/09/2018: BUN 17; Creatinine, Ser 0.85; Potassium 4.4; Sodium 136 12/03/2018: ALT 14; Hemoglobin 9.5; Platelets 255.0; TSH 0.79  Recent Lipid Panel    Component Value Date/Time   CHOL 135 05/27/2018 0808   TRIG 91.0 05/27/2018 0808   HDL 62.00 05/27/2018 0808   CHOLHDL 2 05/27/2018 0808   VLDL 18.2 05/27/2018 0808   LDLCALC 54 05/27/2018 0808    Physical Exam:    VS:  Pulse 87   Temp 98.1 F (36.7 C) (Temporal)   Ht 5' 2.5" (1.588 m)   Wt 129 lb (58.5 kg)   SpO2 94%   BMI 23.22 kg/m     Wt Readings from Last 3 Encounters:  12/08/18 129 lb (58.5 kg)  12/03/18 130 lb (59 kg)  11/03/18 128 lb 8 oz (58.3 kg)     GEN: Well nourished, well developed in no acute distress HEENT: Normal NECK: No JVD; No carotid bruits LYMPHATICS: No lymphadenopathy CARDIAC: regular rhythm, normal S1 and S2, no murmurs, rubs, gallops. Radial and DP pulses 2+ bilaterally. RESPIRATORY:   No consolidation. No tight wheezing but upper airways with inspiratory rhonchi. Faint crackles throughout upper fields. ABDOMEN: Soft, non-tender, non-distended MUSCULOSKELETAL:  No edema; No deformity  SKIN: Warm and dry NEUROLOGIC:  Alert and oriented x 3 PSYCHIATRIC:  Normal affect   ASSESSMENT:     1. Tachycardia   2. Heart palpitations   3. Precordial pain   4. Dyspnea on exertion    PLAN:    Worsening palpitations/tachycardia: had a prior monitor with asymptomatic NSVT, a tach, and ectopy. It would be helpful to know if these are sinus tach events from exertion vs. NSVT vs. Atach/ectopy -3 day Zio -may need to adjust beta blocker or change medications depending on monitor results  Chest tightness/precordial pain: has had recent nuclear stress test, without ischemia -going to have 6 minute walk, high res chest CT, PFT with her pulmonologist to further evaluate  Chronic dyspnea, worse with exertion, with exertional fatigue -she has had echo, event monitor, and nuclear stress test for evaluation -her underlying lung disease is likely the largest factor in these symptoms -she is on clopidogrel and atorvastatin given her recent stroke (complete post stroke aspirin). This will also work for CAD prevention -she is on metoprolol for tachycardia, and it does not appear to have worsened her shortness of breath -her blood pressure is well controlled on amlodipine  Secondary prevention (CVA): -recommend heart healthy/Mediterranean diet, with whole grains, fruits, vegetable, fish, lean meats, nuts, and olive oil. Limit salt. -recommend moderate walking, 3-5 times/week for 30-50 minutes each session. Aim for at least 150 minutes.week. Goal should be pace of 3 miles/hours, or walking 1.5 miles in 30 minutes -recommend avoidance of tobacco products. Avoid excess alcohol. -Additional risk factor control:  -Diabetes: A1c is 5.6  -Dyslipidemia: on atorvastatin, Goal LDL <70.  -Hypertension: BP at goal (recent 130/72)  -Weight: BMI 23  Plan for follow up: 2 mos  Buford Dresser, MD, PhD [place]  [facility]   Medication Adjustments/Labs and Tests Ordered: Current medicines are  reviewed at length with the patient today.  Concerns regarding medicines are outlined above.   Orders Placed This Encounter  Procedures  . LONG TERM MONITOR (3-14 DAYS)   No orders of the defined types were placed in this encounter.   Patient Instructions  Medication Instructions:  Your Physician recommend you continue on your current medication as directed.    If you need a refill on your cardiac medications before your next appointment, please call your pharmacy.   Lab work: None  Testing/Procedures: Our physician has recommended that you wear an 3 DAY ZIO-PATCH monitor. The Zio patch cardiac monitor continuously records heart rhythm data for up to 14 days, this is for patients being evaluated for multiple types heart rhythms. For the first 24 hours post application, please avoid getting the Zio monitor wet in the shower or by excessive sweating during exercise. After that, feel free to carry on with regular activities. Keep soaps and lotions away from the ZIO XT Patch.   This will be placed at our [facility] location - [address].      Follow-Up: At [facility], you and your health needs are our priority.  As part of our continuing mission to provide you with exceptional heart care, we have created designated Provider Care Teams.  These Care Teams include your primary Cardiologist (physician) and Advanced Practice Providers (APPs -  Physician Assistants and Nurse Practitioners) who all work together to provide you with the care you need, when you need it. You will need a follow up appointment in 2 months.  Please call our office 2 months in advance to schedule this appointment.  You may see Buford Dresser, MD or one of the following Advanced Practice Providers on your designated Care Team:   Rosaria Ferries, PA-C . Jory Sims, DNP, ANP      Signed, Buford Dresser, MD PhD 12/08/2018 10:08 PM    [place]

## 2018-12-08 NOTE — Telephone Encounter (Signed)
Enrolled patient for a 3 Day Zio monitor to be mailed. Brief instructions were gone over with the patient and she knows to expect the monitor to arrive in 3-4 days.

## 2018-12-12 ENCOUNTER — Encounter (INDEPENDENT_AMBULATORY_CARE_PROVIDER_SITE_OTHER): Payer: Medicare Other

## 2018-12-12 DIAGNOSIS — R000 Tachycardia, unspecified: Secondary | ICD-10-CM | POA: Diagnosis not present

## 2018-12-14 ENCOUNTER — Encounter: Payer: Self-pay | Admitting: Cardiology

## 2018-12-29 ENCOUNTER — Other Ambulatory Visit: Payer: Self-pay

## 2018-12-31 ENCOUNTER — Other Ambulatory Visit (INDEPENDENT_AMBULATORY_CARE_PROVIDER_SITE_OTHER): Payer: Medicare Other

## 2018-12-31 ENCOUNTER — Other Ambulatory Visit: Payer: Self-pay | Admitting: Primary Care

## 2018-12-31 ENCOUNTER — Ambulatory Visit: Payer: Medicare Other

## 2018-12-31 ENCOUNTER — Other Ambulatory Visit: Payer: Self-pay

## 2018-12-31 DIAGNOSIS — E785 Hyperlipidemia, unspecified: Secondary | ICD-10-CM | POA: Diagnosis not present

## 2018-12-31 DIAGNOSIS — R000 Tachycardia, unspecified: Secondary | ICD-10-CM

## 2018-12-31 LAB — BASIC METABOLIC PANEL
BUN: 18 mg/dL (ref 6–23)
CO2: 28 mEq/L (ref 19–32)
Calcium: 9.5 mg/dL (ref 8.4–10.5)
Chloride: 104 mEq/L (ref 96–112)
Creatinine, Ser: 0.92 mg/dL (ref 0.40–1.20)
GFR: 58.88 mL/min — ABNORMAL LOW (ref >60.00)
Glucose, Bld: 99 mg/dL (ref 70–99)
Potassium: 4.6 mEq/L (ref 3.5–5.1)
Sodium: 138 mEq/L (ref 135–145)

## 2018-12-31 LAB — LIPID PANEL
Cholesterol: 137 mg/dL (ref 0–200)
HDL: 51.2 mg/dL (ref >39.00)
LDL Cholesterol: 54 mg/dL (ref 0–99)
NonHDL: 85.95
Total CHOL/HDL Ratio: 3
Triglycerides: 162 mg/dL — ABNORMAL HIGH (ref 0.0–149.0)
VLDL: 32.4 mg/dL (ref 0.0–40.0)

## 2018-12-31 MED ORDER — METOPROLOL SUCCINATE ER 25 MG PO TB24: 25.0000 mg | ORAL_TABLET | Freq: Every day | ORAL | 11 refills | Status: DC

## 2019-01-04 ENCOUNTER — Ambulatory Visit (INDEPENDENT_AMBULATORY_CARE_PROVIDER_SITE_OTHER): Payer: Medicare Other | Admitting: Primary Care

## 2019-01-04 ENCOUNTER — Other Ambulatory Visit: Payer: Self-pay

## 2019-01-04 ENCOUNTER — Encounter: Payer: Self-pay | Admitting: Primary Care

## 2019-01-04 VITALS — BP 124/74 | HR 84 | Temp 98.3°F | Ht 62.5 in | Wt 128.2 lb

## 2019-01-04 DIAGNOSIS — J84112 Idiopathic pulmonary fibrosis: Secondary | ICD-10-CM | POA: Diagnosis not present

## 2019-01-04 DIAGNOSIS — R42 Dizziness and giddiness: Secondary | ICD-10-CM

## 2019-01-04 DIAGNOSIS — D638 Anemia in other chronic diseases classified elsewhere: Secondary | ICD-10-CM

## 2019-01-04 DIAGNOSIS — E2839 Other primary ovarian failure: Secondary | ICD-10-CM | POA: Diagnosis not present

## 2019-01-04 DIAGNOSIS — I6300 Cerebral infarction due to thrombosis of unspecified precerebral artery: Secondary | ICD-10-CM

## 2019-01-04 DIAGNOSIS — R7303 Prediabetes: Secondary | ICD-10-CM | POA: Diagnosis not present

## 2019-01-04 DIAGNOSIS — K219 Gastro-esophageal reflux disease without esophagitis: Secondary | ICD-10-CM

## 2019-01-04 DIAGNOSIS — E890 Postprocedural hypothyroidism: Secondary | ICD-10-CM | POA: Diagnosis not present

## 2019-01-04 DIAGNOSIS — Z0000 Encounter for general adult medical examination without abnormal findings: Secondary | ICD-10-CM

## 2019-01-04 DIAGNOSIS — J4520 Mild intermittent asthma, uncomplicated: Secondary | ICD-10-CM

## 2019-01-04 DIAGNOSIS — R29898 Other symptoms and signs involving the musculoskeletal system: Secondary | ICD-10-CM

## 2019-01-04 DIAGNOSIS — I10 Essential (primary) hypertension: Secondary | ICD-10-CM

## 2019-01-04 DIAGNOSIS — R000 Tachycardia, unspecified: Secondary | ICD-10-CM

## 2019-01-04 LAB — CBC
HCT: 28.6 % — ABNORMAL LOW (ref 36.0–46.0)
Hemoglobin: 8.9 g/dL — ABNORMAL LOW (ref 12.0–15.0)
MCHC: 31.2 g/dL (ref 30.0–36.0)
MCV: 74.4 fl — ABNORMAL LOW (ref 78.0–100.0)
Platelets: 240 10*3/uL (ref 150.0–400.0)
RBC: 3.84 Mil/uL — ABNORMAL LOW (ref 3.87–5.11)
RDW: 16.9 % — ABNORMAL HIGH (ref 11.5–15.5)
WBC: 6.2 10*3/uL (ref 4.0–10.5)

## 2019-01-04 LAB — HEMOGLOBIN A1C: Hgb A1c MFr Bld: 6.2 % (ref 4.6–6.5)

## 2019-01-04 NOTE — Progress Notes (Signed)
Subjective:    Patient ID: Tina Calderon, female    DOB: May 27, 1941, 78 y.o.   MRN: [id number]  HPI  Tina Calderon is a 78 year old female who presents today for complete physical.  Immunizations: -Tetanus: Completed in 2019 -Influenza: Due this season  -Pneumonia: Completed in 2019 last -Shingles: Completed   Diet: She endorses a healthy diet. Little meat. Eating mostly salads, vegetables, whole grains, fruit. Little desserts. Mostly drinking water, some diet soda. Exercise: Active but not exercising due to chronic shortness of breath.   Eye exam: Completed in 2019 Dental exam: No recent exam Colonoscopy: Completed, declines due to age 34: Completed in June 2020 Dexa: No recent scan  BP Readings from Last 3 Encounters:  01/04/19 124/74  12/03/18 130/72  11/03/18 136/80     Review of Systems  Constitutional: Negative for unexpected weight change.  HENT: Negative for rhinorrhea.   Respiratory: Positive for shortness of breath. Negative for cough.        Chronic SOB  Cardiovascular: Negative for chest pain.       Intermittent palpitations, chronic  Gastrointestinal: Negative for constipation and diarrhea.  Genitourinary: Negative for difficulty urinating.  Musculoskeletal: Negative for arthralgias.  Skin: Negative for rash.  Allergic/Immunologic: Positive for environmental allergies.  Neurological: Negative for dizziness and headaches.       Denies recent vertigo symptoms  Psychiatric/Behavioral: The patient is not nervous/anxious.        Past Medical History:  Diagnosis Date  . Anginal pain ([place])   . Cancer ([place])   . CHF (congestive heart failure) ([place]) 01/22/2012  . Chronic fatigue 07/15/2014  . CVA (cerebral vascular accident) ([place]) 04/18/2018  . GERD (gastroesophageal reflux disease) 05/12/2017  . Hypertension   . Hypothyroidism   . ILD (interstitial lung disease) ([place]) 02/04/2012  . IPF (idiopathic pulmonary fibrosis) ([place]) 2013  . Mild  intermittent asthma 01/29/2016  . Pelvic relaxation   . Prolapse of bladder   . Shortness of breath   . Thyroid disease      Social History   Socioeconomic History  . Marital status: Married    Spouse name: Not on file  . Number of children: Not on file  . Years of education: Not on file  . Highest education level: Not on file  Occupational History  . Not on file  Social Needs  . Financial resource strain: Not on file  . Food insecurity    Worry: Not on file    Inability: Not on file  . Transportation needs    Medical: Not on file    Non-medical: Not on file  Tobacco Use  . Smoking status: Never Smoker  . Smokeless tobacco: Never Used  Substance and Sexual Activity  . Alcohol use: No  . Drug use: No  . Sexual activity: Never    Birth control/protection: Post-menopausal, Surgical    Comment: HYST  Lifestyle  . Physical activity    Days per week: Not on file    Minutes per session: Not on file  . Stress: Not on file  Relationships  . Social Herbalist on phone: Not on file    Gets together: Not on file    Attends religious service: Not on file    Active member of club or organization: Not on file    Attends meetings of clubs or organizations: Not on file    Relationship status: Not on file  . Intimate partner violence    Fear  of current or ex partner: Not on file    Emotionally abused: Not on file    Physically abused: Not on file    Forced sexual activity: Not on file  Other Topics Concern  . Not on file  Social History Narrative   Married.   2 children, 5 grandchildren.   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   Enjoys puzzles, sewing, traveling, reading.     Past Surgical History:  Procedure Laterality Date  . ANTERIOR AND POSTERIOR REPAIR  12/05/2011   Procedure: ANTERIOR (CYSTOCELE) AND POSTERIOR REPAIR (RECTOCELE);  Surgeon: [name], MD;  Location: [facility];  Service: Gynecology;  Laterality: N/A;  with cysto  . Bladder tact  15 years  ago  . LEFT HEART CATHETERIZATION WITH CORONARY ANGIOGRAM N/A 01/22/2012   Procedure: LEFT HEART CATHETERIZATION WITH CORONARY ANGIOGRAM;  Surgeon: [name], MD;  Location: [facility] CATH LAB;  Service: Cardiovascular;  Laterality: N/A;  . NECK SURGERY  2010  . SHOULDER SURGERY    . thyroid disease    . THYROID LOBECTOMY Left 07/17/2018   Procedure: LEFT THYROID LOBECTOMY;  Surgeon: [name], MD;  Location: [facility];  Service: General;  Laterality: Left;  . TONSILLECTOMY    . VAGINAL HYSTERECTOMY  15 years ago    Family History  Problem Relation Age of Onset  . Thyroid disease Mother   . Hypertension Mother   . Coronary artery disease Mother   . Dementia Mother   . Stroke Mother   . Aneurysm Father   . Pulmonary fibrosis Sister   . Stroke Other   . Sleep apnea Other   . Cancer Brother        LIVER  . Pulmonary fibrosis Brother     Allergies  Allergen Reactions  . Dilaudid [Hydromorphone] Nausea And Vomiting  . Crestor [Rosuvastatin Calcium] Other (See Comments)    Myalgias, muscle weakness    Current Outpatient Medications on File Prior to Visit  Medication Sig Dispense Refill  . acetaminophen (TYLENOL) 500 MG tablet Take 1,000 mg by mouth every 6 (six) hours as needed for mild pain or headache.     [name] amLODipine (NORVASC) 5 MG tablet TAKE 1 TABLET BY MOUTH EVERY DAY FOR BLOOD PRESSURE 90 tablet 1  . atorvastatin (LIPITOR) 40 MG tablet Take 1 tablet (40 mg total) by mouth at bedtime. For cholesterol. 90 tablet 3  . CALCIUM CARBONATE-VITAMIN D PO Take 1 tablet by mouth daily.     . clopidogrel (PLAVIX) 75 MG tablet Take 1 tablet (75 mg total) by mouth daily. For stroke prevention. 90 tablet 3  . CRANBERRY PO Take 1 capsule by mouth every Monday, Wednesday, and Friday.     . levothyroxine (SYNTHROID) 100 MCG tablet Take 1 tablet by mouth every morning on an empty stomach. No food or other medications for 30 minutes. 90 tablet 1  . loratadine (CLARITIN) 10 MG tablet Take 10 mg  by mouth daily.     . metoprolol succinate (TOPROL-XL) 25 MG 24 hr tablet Take 1 tablet (25 mg total) by mouth daily. 30 tablet 11  . Multiple Vitamins-Minerals (CENTRUM SILVER PO) Take 1 tablet by mouth daily.    [name] omeprazole (PRILOSEC) 20 MG capsule TAKE 1 CAPSULE (20 MG TOTAL) BY MOUTH DAILY. 90 capsule 0  . ESBRIET 801 MG TABS Take 1 tablet by mouth 3 (three) times daily.     No current facility-administered medications on file prior to visit.     BP 124/74  Pulse 84   Temp 98.3 F (36.8 C) (Temporal)   Ht 5' 2.5" (1.588 m)   Wt 128 lb 4 oz (58.2 kg)   SpO2 94%   BMI 23.08 kg/m    Objective:   Physical Exam  Constitutional: She is oriented to person, place, and time. She appears well-nourished.  HENT:  Mouth/Throat: No oropharyngeal exudate.  Eyes: Pupils are equal, round, and reactive to light. EOM are normal.  Neck: Neck supple. No thyromegaly present.  Cardiovascular: Normal rate and regular rhythm.  Respiratory: Effort normal and breath sounds normal.  GI: Soft. Bowel sounds are normal. There is no abdominal tenderness.  Musculoskeletal: Normal range of motion.  Neurological: She is alert and oriented to person, place, and time.  Skin: Skin is warm and dry.  Psychiatric: She has a normal mood and affect.           Assessment & Plan:

## 2019-01-04 NOTE — Assessment & Plan Note (Signed)
Immunizations UTD. Mammogram UTD. Bone density due, pending.  Encouraged to work on activity level. Continue healthy diet. Exam stable. Labs reviewed and pending.

## 2019-01-04 NOTE — Assessment & Plan Note (Signed)
No recent episodes of lower extremity weakness. Continue to monitor.

## 2019-01-04 NOTE — Assessment & Plan Note (Signed)
Doing well on omeprazole 20 mg, continue same.

## 2019-01-04 NOTE — Assessment & Plan Note (Signed)
Stable in the office today, continue current regimen.  BMP reviewed.

## 2019-01-04 NOTE — Assessment & Plan Note (Signed)
Following with pulmonology, high resolution CT chest pending. Continue Esbriet. Chronic SOB.

## 2019-01-04 NOTE — Assessment & Plan Note (Signed)
Denies concerns for vertigo.

## 2019-01-04 NOTE — Assessment & Plan Note (Signed)
Taking levothyroxine appropriately, she is separating calcium and vitamin D and omeprazole four hours from her dose. Recent TSH stable. Continue levothyroxine 100 mcg.

## 2019-01-04 NOTE — Assessment & Plan Note (Signed)
Repeat A1C pending.

## 2019-01-04 NOTE — Assessment & Plan Note (Signed)
Asymptomatic, no new symptoms. Compliant to clopidogrel and atorvastatin.  Good BP control.

## 2019-01-04 NOTE — Assessment & Plan Note (Signed)
Following with pulmonology, recent PFT's. She will touch base regarding potential treatment for chronic SOB. Repeat CBC pending given history of anemia.

## 2019-01-04 NOTE — Assessment & Plan Note (Signed)
Recently underwent Holter Monitor evaluation per cardiology with 4 brief beats of VT, overall unremarkable. Metoprolol increased to 25 mg daily. HR stable in the office today.

## 2019-01-04 NOTE — Patient Instructions (Signed)
Stop by the lab prior to leaving today. I will notify you of your results once received.   Call the breast center to schedule your bone density scan.  Follow up with the pulmonologist as scheduled.  Try to get some weight bearing exercise for bone strength. Continue calcium and vitamin D.  Be sure to take your levothyroxine (thyroid medication) every morning on an empty stomach with water only. No food or other medications for 30 minutes. No heartburn medication, iron pills, calcium, vitamin D, or magnesium pills within four hours of taking levothyroxine.   It was a pleasure to see you today!   Preventive Care 79 Years and Older, Female Preventive care refers to lifestyle choices and visits with your health care provider that can promote health and wellness. This includes:  A yearly physical exam. This is also called an annual well check.  Regular dental and eye exams.  Immunizations.  Screening for certain conditions.  Healthy lifestyle choices, such as diet and exercise. What can I expect for my preventive care visit? Physical exam Your health care provider will check:  Height and weight. These may be used to calculate body mass index (BMI), which is a measurement that tells if you are at a healthy weight.  Heart rate and blood pressure.  Your skin for abnormal spots. Counseling Your health care provider may ask you questions about:  Alcohol, tobacco, and drug use.  Emotional well-being.  Home and relationship well-being.  Sexual activity.  Eating habits.  History of falls.  Memory and ability to understand (cognition).  Work and work Statistician.  Pregnancy and menstrual history. What immunizations do I need?  Influenza (flu) vaccine  This is recommended every year. Tetanus, diphtheria, and pertussis (Tdap) vaccine  You may need a Td booster every 10 years. Varicella (chickenpox) vaccine  You may need this vaccine if you have not already been  vaccinated. Zoster (shingles) vaccine  You may need this after age 7. Pneumococcal conjugate (PCV13) vaccine  One dose is recommended after age 40. Pneumococcal polysaccharide (PPSV23) vaccine  One dose is recommended after age 57. Measles, mumps, and rubella (MMR) vaccine  You may need at least one dose of MMR if you were born in 1957 or later. You may also need a second dose. Meningococcal conjugate (MenACWY) vaccine  You may need this if you have certain conditions. Hepatitis A vaccine  You may need this if you have certain conditions or if you travel or work in places where you may be exposed to hepatitis A. Hepatitis B vaccine  You may need this if you have certain conditions or if you travel or work in places where you may be exposed to hepatitis B. Haemophilus influenzae type b (Hib) vaccine  You may need this if you have certain conditions. You may receive vaccines as individual doses or as more than one vaccine together in one shot (combination vaccines). Talk with your health care provider about the risks and benefits of combination vaccines. What tests do I need? Blood tests  Lipid and cholesterol levels. These may be checked every 5 years, or more frequently depending on your overall health.  Hepatitis C test.  Hepatitis B test. Screening  Lung cancer screening. You may have this screening every year starting at age 46 if you have a 30-pack-year history of smoking and currently smoke or have quit within the past 15 years.  Colorectal cancer screening. All adults should have this screening starting at age 4 and continuing until  age 76. Your health care provider may recommend screening at age 53 if you are at increased risk. You will have tests every 1-10 years, depending on your results and the type of screening test.  Diabetes screening. This is done by checking your blood sugar (glucose) after you have not eaten for a while (fasting). You may have this done  every 1-3 years.  Mammogram. This may be done every 1-2 years. Talk with your health care provider about how often you should have regular mammograms.  BRCA-related cancer screening. This may be done if you have a family history of breast, ovarian, tubal, or peritoneal cancers. Other tests  Sexually transmitted disease (STD) testing.  Bone density scan. This is done to screen for osteoporosis. You may have this done starting at age 17. Follow these instructions at home: Eating and drinking  Eat a diet that includes fresh fruits and vegetables, whole grains, lean protein, and low-fat dairy products. Limit your intake of foods with high amounts of sugar, saturated fats, and salt.  Take vitamin and mineral supplements as recommended by your health care provider.  Do not drink alcohol if your health care provider tells you not to drink.  If you drink alcohol: ? Limit how much you have to 0-1 drink a day. ? Be aware of how much alcohol is in your drink. In the [place] one drink equals one 12 oz bottle of beer (355 mL), one 5 oz glass of wine (148 mL), or one 1 oz glass of hard liquor (44 mL). Lifestyle  Take daily care of your teeth and gums.  Stay active. Exercise for at least 30 minutes on 5 or more days each week.  Do not use any products that contain nicotine or tobacco, such as cigarettes, e-cigarettes, and chewing tobacco. If you need help quitting, ask your health care provider.  If you are sexually active, practice safe sex. Use a condom or other form of protection in order to prevent STIs (sexually transmitted infections).  Talk with your health care provider about taking a low-dose aspirin or statin. What's next?  Go to your health care provider once a year for a well check visit.  Ask your health care provider how often you should have your eyes and teeth checked.  Stay up to date on all vaccines. This information is not intended to replace advice given to you by your  health care provider. Make sure you discuss any questions you have with your health care provider. Document Released: 06/23/2015 Document Revised: 05/21/2018 Document Reviewed: 05/21/2018 Elsevier Patient Education  2020 [employer].

## 2019-01-06 DIAGNOSIS — D649 Anemia, unspecified: Secondary | ICD-10-CM

## 2019-01-08 ENCOUNTER — Other Ambulatory Visit: Payer: Self-pay

## 2019-01-08 ENCOUNTER — Ambulatory Visit
Admission: RE | Admit: 2019-01-08 | Discharge: 2019-01-08 | Disposition: A | Discharge status: Home / Self Care | Payer: Medicare Other | Source: Ambulatory Visit | Attending: Primary Care | Admitting: Primary Care

## 2019-01-08 DIAGNOSIS — E2839 Other primary ovarian failure: Secondary | ICD-10-CM

## 2019-01-13 NOTE — Telephone Encounter (Signed)
He said that he would start the inhaler after seeing upcoming PFT results. Thanks.

## 2019-01-13 NOTE — Telephone Encounter (Signed)
Tina Calderon - please advise. Thanks.

## 2019-01-15 ENCOUNTER — Ambulatory Visit (INDEPENDENT_AMBULATORY_CARE_PROVIDER_SITE_OTHER)
Admission: RE | Admit: 2019-01-15 | Discharge: 2019-01-15 | Disposition: A | Discharge status: Home / Self Care | Payer: Medicare Other | Source: Ambulatory Visit | Attending: Emergency Medicine | Admitting: Emergency Medicine

## 2019-01-15 ENCOUNTER — Other Ambulatory Visit: Payer: Self-pay

## 2019-01-15 DIAGNOSIS — J84112 Idiopathic pulmonary fibrosis: Secondary | ICD-10-CM | POA: Diagnosis not present

## 2019-01-19 ENCOUNTER — Other Ambulatory Visit: Payer: Self-pay | Admitting: Emergency Medicine

## 2019-01-20 ENCOUNTER — Inpatient Hospital Stay: Payer: Medicare Other

## 2019-01-20 ENCOUNTER — Inpatient Hospital Stay: Payer: Medicare Other | Attending: Oncology | Admitting: Oncology

## 2019-01-20 ENCOUNTER — Encounter: Payer: Self-pay | Admitting: Oncology

## 2019-01-20 ENCOUNTER — Other Ambulatory Visit: Payer: Self-pay

## 2019-01-20 VITALS — BP 111/62 | HR 102 | Temp 97.8°F | Resp 20 | Ht 62.5 in | Wt 125.1 lb

## 2019-01-20 DIAGNOSIS — Z8249 Family history of ischemic heart disease and other diseases of the circulatory system: Secondary | ICD-10-CM | POA: Diagnosis not present

## 2019-01-20 DIAGNOSIS — Z800 Family history of malignant neoplasm of digestive organs: Secondary | ICD-10-CM | POA: Diagnosis not present

## 2019-01-20 DIAGNOSIS — Z79899 Other long term (current) drug therapy: Secondary | ICD-10-CM

## 2019-01-20 DIAGNOSIS — K219 Gastro-esophageal reflux disease without esophagitis: Secondary | ICD-10-CM | POA: Diagnosis not present

## 2019-01-20 DIAGNOSIS — R0602 Shortness of breath: Secondary | ICD-10-CM | POA: Diagnosis not present

## 2019-01-20 DIAGNOSIS — Z825 Family history of asthma and other chronic lower respiratory diseases: Secondary | ICD-10-CM

## 2019-01-20 DIAGNOSIS — R5383 Other fatigue: Secondary | ICD-10-CM | POA: Diagnosis not present

## 2019-01-20 DIAGNOSIS — R634 Abnormal weight loss: Secondary | ICD-10-CM | POA: Diagnosis not present

## 2019-01-20 DIAGNOSIS — J4520 Mild intermittent asthma, uncomplicated: Secondary | ICD-10-CM

## 2019-01-20 DIAGNOSIS — Z823 Family history of stroke: Secondary | ICD-10-CM

## 2019-01-20 DIAGNOSIS — Z8349 Family history of other endocrine, nutritional and metabolic diseases: Secondary | ICD-10-CM

## 2019-01-20 DIAGNOSIS — J84112 Idiopathic pulmonary fibrosis: Secondary | ICD-10-CM | POA: Insufficient documentation

## 2019-01-20 DIAGNOSIS — I110 Hypertensive heart disease with heart failure: Secondary | ICD-10-CM | POA: Insufficient documentation

## 2019-01-20 DIAGNOSIS — D509 Iron deficiency anemia, unspecified: Secondary | ICD-10-CM | POA: Insufficient documentation

## 2019-01-20 DIAGNOSIS — Z8673 Personal history of transient ischemic attack (TIA), and cerebral infarction without residual deficits: Secondary | ICD-10-CM | POA: Insufficient documentation

## 2019-01-20 DIAGNOSIS — R05 Cough: Secondary | ICD-10-CM

## 2019-01-20 DIAGNOSIS — I509 Heart failure, unspecified: Secondary | ICD-10-CM | POA: Diagnosis not present

## 2019-01-20 DIAGNOSIS — Z885 Allergy status to narcotic agent status: Secondary | ICD-10-CM

## 2019-01-20 DIAGNOSIS — Z820 Family history of epilepsy and other diseases of the nervous system: Secondary | ICD-10-CM | POA: Diagnosis not present

## 2019-01-20 DIAGNOSIS — E039 Hypothyroidism, unspecified: Secondary | ICD-10-CM | POA: Insufficient documentation

## 2019-01-20 LAB — CBC WITH DIFFERENTIAL/PLATELET
Abs Immature Granulocytes: 0.04 K/uL (ref 0.00–0.07)
Basophils Absolute: 0.1 K/uL (ref 0.0–0.1)
Basophils Relative: 1 %
Eosinophils Absolute: 0.2 K/uL (ref 0.0–0.5)
Eosinophils Relative: 2 %
HCT: 28.1 % — ABNORMAL LOW (ref 36.0–46.0)
Hemoglobin: 8.6 g/dL — ABNORMAL LOW (ref 12.0–15.0)
Immature Granulocytes: 1 %
Lymphocytes Relative: 12 %
Lymphs Abs: 1.1 K/uL (ref 0.7–4.0)
MCH: 22.8 pg — ABNORMAL LOW (ref 26.0–34.0)
MCHC: 30.6 g/dL (ref 30.0–36.0)
MCV: 74.3 fL — ABNORMAL LOW (ref 80.0–100.0)
Monocytes Absolute: 0.8 K/uL (ref 0.1–1.0)
Monocytes Relative: 10 %
Neutro Abs: 6.4 K/uL (ref 1.7–7.7)
Neutrophils Relative %: 74 %
Platelets: 303 K/uL (ref 150–400)
RBC: 3.78 MIL/uL — ABNORMAL LOW (ref 3.87–5.11)
RDW: 16 % — ABNORMAL HIGH (ref 11.5–15.5)
WBC: 8.6 K/uL (ref 4.0–10.5)
nRBC: 0 % (ref 0.0–0.2)

## 2019-01-20 LAB — IRON AND TIBC
Iron: 22 ug/dL — ABNORMAL LOW (ref 28–170)
Saturation Ratios: 5 % — ABNORMAL LOW (ref 10.4–31.8)
TIBC: 415 ug/dL (ref 250–450)
UIBC: 393 ug/dL

## 2019-01-20 LAB — RETIC PANEL
Immature Retic Fract: 25.6 % — ABNORMAL HIGH (ref 2.3–15.9)
RBC.: 3.78 MIL/uL — ABNORMAL LOW (ref 3.87–5.11)
Retic Count, Absolute: 85.4 K/uL (ref 19.0–186.0)
Retic Ct Pct: 2.3 % (ref 0.4–3.1)
Reticulocyte Hemoglobin: 26.9 pg — ABNORMAL LOW

## 2019-01-20 LAB — TECHNOLOGIST SMEAR REVIEW

## 2019-01-20 LAB — FERRITIN: Ferritin: 11 ng/mL (ref 11–307)

## 2019-01-20 NOTE — Progress Notes (Signed)
Hematology/Oncology Consult note [facility] [phone] [phone]   Patient Care Team: [name], NP as PCP - General (Internal Medicine) Buford Dresser, MD as PCP - Cardiology (Cardiology)  REFERRING PROVIDER: [name], NP  CHIEF COMPLAINTS/REASON FOR VISIT:  Evaluation of anemia  HISTORY OF PRESENTING ILLNESS:  Tina Calderon is a  78 y.o.  female with PMH listed below who was seen at the request of [name], NP For evaluation of anemia. Reviewed patient's recent labs that was done.  Labs revealed anemia with hemoglobin of 8.9, MCV 74.4 on 01/04/2019. Reviewed patient's previous labs ordered by other physicians, anemia is chronic onset , duration is since 2011.  Hemoglobin was at baseline between 10-11.  Hemoglobin started to drop since June 2020.  MCV also decreased to be below 80 since June 2020. No aggravating or improving factors.  Associated signs and symptoms: Patient reports fatigue.  SOB with exertion.  Denies weight loss, easy bruising, hematochezia, hemoptysis, hematuria. Context: History of GI bleeding: Denies               History of Chronic kidney disease denies                              Last colonoscopy: Reported last colonoscopy in 2006.                Patient has history of interstitial lung disease, coexisting COPD, hiatal hernia. Reports feeling very fatigued and tired.  Exacerbated with exertion.  Chronic shortness of breath appetite is not good.  Lost 4 pounds since June 2020.  Not eating much.  Review of Systems  Constitutional: Positive for appetite change, fatigue and unexpected weight change. Negative for chills and fever.  HENT:   Negative for hearing loss and voice change.   Eyes: Negative for eye problems.  Respiratory: Positive for cough and shortness of breath. Negative for chest tightness.   Cardiovascular: Negative for chest pain.  Gastrointestinal: Negative  for abdominal distention, abdominal pain and blood in stool.  Endocrine: Negative for hot flashes.  Genitourinary: Negative for difficulty urinating and frequency.   Musculoskeletal: Negative for arthralgias.  Skin: Negative for itching and rash.  Neurological: Negative for extremity weakness.  Hematological: Negative for adenopathy.  Psychiatric/Behavioral: Negative for confusion. The patient is nervous/anxious.      MEDICAL HISTORY:  Past Medical History:  Diagnosis Date  . Anginal pain ([place])   . Cancer ([place])   . CHF (congestive heart failure) ([place]) 01/22/2012  . Chronic fatigue 07/15/2014  . CVA (cerebral vascular accident) ([place]) 04/18/2018  . GERD (gastroesophageal reflux disease) 05/12/2017  . Hypertension   . Hypothyroidism   . ILD (interstitial lung disease) ([place]) 02/04/2012  . IPF (idiopathic pulmonary fibrosis) ([place]) 2013  . Mild intermittent asthma 01/29/2016  . Pelvic relaxation   . Prolapse of bladder   . Shortness of breath   . Thyroid disease     SURGICAL HISTORY: Past Surgical History:  Procedure Laterality Date  . ANTERIOR AND POSTERIOR REPAIR  12/05/2011   Procedure: ANTERIOR (CYSTOCELE) AND POSTERIOR REPAIR (RECTOCELE);  Surgeon: [name], MD;  Location: [facility];  Service: Gynecology;  Laterality: N/A;  with cysto  . Bladder tact  15 years ago  . LEFT HEART CATHETERIZATION WITH CORONARY ANGIOGRAM N/A 01/22/2012   Procedure: LEFT HEART CATHETERIZATION WITH CORONARY ANGIOGRAM;  Surgeon: [name], MD;  Location: [facility];  Service: Cardiovascular;  Laterality: N/A;  . NECK SURGERY  2010  . SHOULDER SURGERY    . thyroid disease    . THYROID LOBECTOMY Left 07/17/2018   Procedure: LEFT THYROID LOBECTOMY;  Surgeon: [name], MD;  Location: [facility];  Service: General;  Laterality: Left;  . TONSILLECTOMY    . VAGINAL HYSTERECTOMY  15 years ago    SOCIAL HISTORY: Social History   Socioeconomic History  . Marital status: Married    Spouse name: Not  on file  . Number of children: Not on file  . Years of education: Not on file  . Highest education level: Not on file  Occupational History  . Not on file  Social Needs  . Financial resource strain: Not on file  . Food insecurity    Worry: Not on file    Inability: Not on file  . Transportation needs    Medical: Not on file    Non-medical: Not on file  Tobacco Use  . Smoking status: Never Smoker  . Smokeless tobacco: Never Used  Substance and Sexual Activity  . Alcohol use: No  . Drug use: No  . Sexual activity: Never    Birth control/protection: Post-menopausal, Surgical    Comment: HYST  Lifestyle  . Physical activity    Days per week: Not on file    Minutes per session: Not on file  . Stress: Not on file  Relationships  . Social Herbalist on phone: Not on file    Gets together: Not on file    Attends religious service: Not on file    Active member of club or organization: Not on file    Attends meetings of clubs or organizations: Not on file    Relationship status: Not on file  . Intimate partner violence    Fear of current or ex partner: Not on file    Emotionally abused: Not on file    Physically abused: Not on file    Forced sexual activity: Not on file  Other Topics Concern  . Not on file  Social History Narrative   Married.   2 children, 5 grandchildren.   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   Enjoys puzzles, sewing, traveling, reading.     FAMILY HISTORY: Family History  Problem Relation Age of Onset  . Thyroid disease Mother   . Hypertension Mother   . Coronary artery disease Mother   . Dementia Mother   . Stroke Mother   . Aneurysm Father   . Pulmonary fibrosis Sister   . Stroke Other   . Sleep apnea Other   . Cancer Brother        LIVER  . Pulmonary fibrosis Brother     ALLERGIES:  is allergic to dilaudid [hydromorphone] and crestor [rosuvastatin calcium].  MEDICATIONS:  Current Outpatient Medications  Medication Sig  Dispense Refill  . acetaminophen (TYLENOL) 500 MG tablet Take 1,000 mg by mouth every 6 (six) hours as needed for mild pain or headache.     [name] amLODipine (NORVASC) 5 MG tablet TAKE 1 TABLET BY MOUTH EVERY DAY FOR BLOOD PRESSURE 90 tablet 1  . atorvastatin (LIPITOR) 40 MG tablet Take 1 tablet (40 mg total) by mouth at bedtime. For cholesterol. 90 tablet 3  . CALCIUM CARBONATE-VITAMIN D PO Take 1 tablet by mouth daily.     . clopidogrel (PLAVIX) 75 MG tablet Take 1 tablet (75 mg total) by mouth daily. For stroke prevention. 90 tablet 3  .  CRANBERRY PO Take 1 capsule by mouth every Monday, Wednesday, and Friday.     . ESBRIET 801 MG TABS Take 1 tablet by mouth 3 (three) times daily.    . ferrous sulfate 325 (65 FE) MG tablet Take 325 mg by mouth daily.    [name] levothyroxine (SYNTHROID) 100 MCG tablet Take 1 tablet by mouth every morning on an empty stomach. No food or other medications for 30 minutes. 90 tablet 1  . loratadine (CLARITIN) 10 MG tablet Take 10 mg by mouth daily.     . metoprolol succinate (TOPROL-XL) 25 MG 24 hr tablet Take 1 tablet (25 mg total) by mouth daily. 30 tablet 11  . Multiple Vitamins-Minerals (CENTRUM SILVER PO) Take 1 tablet by mouth daily.    [name] omeprazole (PRILOSEC) 20 MG capsule TAKE 1 CAPSULE BY MOUTH EVERY DAY 90 capsule 1   No current facility-administered medications for this visit.      PHYSICAL EXAMINATION: ECOG PERFORMANCE STATUS: 2 - Symptomatic, <50% confined to bed Vitals:   01/20/19 0939  BP: 111/62  Pulse: (!) 102  Resp: 20  Temp: 97.8 F (36.6 C)  SpO2: 97%   Filed Weights   01/20/19 0939  Weight: 125 lb 1.6 oz (56.7 kg)    Physical Exam Constitutional:      General: She is not in acute distress.    Comments: Frail appearance  HENT:     Head: Normocephalic and atraumatic.  Eyes:     General: No scleral icterus.    Pupils: Pupils are equal, round, and reactive to light.  Neck:     Musculoskeletal: Normal range of motion and neck supple.   Cardiovascular:     Rate and Rhythm: Normal rate and regular rhythm.     Heart sounds: Normal heart sounds.  Pulmonary:     Effort: Pulmonary effort is normal.     Comments: Severely diminished breath sound bilaterally, crackles Abdominal:     General: Bowel sounds are normal. There is no distension.     Palpations: Abdomen is soft. There is no mass.     Tenderness: There is no abdominal tenderness.  Musculoskeletal: Normal range of motion.        General: No deformity.  Skin:    General: Skin is warm and dry.     Coloration: Skin is pale.     Findings: No erythema or rash.  Neurological:     General: No focal deficit present.     Mental Status: She is alert and oriented to person, place, and time.     Cranial Nerves: No cranial nerve deficit.     Coordination: Coordination normal.  Psychiatric:     Comments: Anxious      LABORATORY DATA:  I have reviewed the data as listed Lab Results  Component Value Date   WBC 8.6 01/20/2019   HGB 8.6 (L) 01/20/2019   HCT 28.1 (L) 01/20/2019   MCV 74.3 (L) 01/20/2019   PLT 303 01/20/2019   Recent Labs    04/18/18 0931  04/20/18 0547 05/27/18 0808 07/09/18 1107 12/03/18 1133 12/31/18 1412  NA 136   < > 138  --  136  --  138  K 4.3   < > 3.8  --  4.4  --  4.6  CL 103   < > 106  --  104  --  104  CO2 26  --  26  --  25  --  28  GLUCOSE 109*   < >  88  --  105*  --  99  BUN 11   < > 10  --  17  --  18  CREATININE 0.98   < > 0.81  --  0.85  --  0.92  CALCIUM 8.8*  --  8.7*  --  8.9  --  9.5  GFRNONAA 54*  --  >60  --  >60  --   --   GFRAA >60  --  >60  --  >60  --   --   PROT 6.4*  --   --  7.0  --  6.8  --   ALBUMIN 3.8  --   --  4.2  --  4.1  --   AST 23  --   --  17  --  20  --   ALT 12  --   --  12  --  14  --   ALKPHOS 52  --   --  64  --  56  --   BILITOT 0.5  --   --  0.4  --  0.3  --   BILIDIR  --   --   --  0.1  --  0.1  --    < > = values in this interval not displayed.   Iron/TIBC/Ferritin/ %Sat    Component  Value Date/Time   IRON 22 (L) 01/20/2019 1031   TIBC 415 01/20/2019 1031   FERRITIN 11 01/20/2019 1031   IRONPCTSAT 5 (L) 01/20/2019 1031        ASSESSMENT & PLAN:  1. Iron deficiency anemia, unspecified iron deficiency anemia type   2. Weight loss    Labs are reviewed and discussed with patient. Macrocytic anemia most likely secondary to underlying iron deficiency. She has not had iron panel checked recently. We will check a CBC, smear, iron/TIBC/ferritin, smear, multiple myeloma panel.  Currently patient is taking oral iron supplementation with ferrous sulfate 325 mg once daily. We discussed the option of IV iron infusion.  Rationale and potential side effects were discussed. Patient prefers to wait on IV iron infusion for now.  Would like to try oral iron supplementation to see if it will improve her anemia. Recommend patient to increase ferrous sulfate 325 to 2-3 times a day.  Discussed about potential GI side effects.  She has not experienced much. Offered to send patient prescription.  Patient prefers to use her over-the-counter supplies.  #Weight loss Last colonoscopy was in 2006.  Recommend patient establish care with gastroenterology for additional evaluation.  Patient feels that currently she is stressed due to multiple medical problems, husband health is also not well, prefer not to proceed with GI evaluation at this point.  Orders Placed This Encounter  Procedures  . CBC with Differential/Platelet    Standing Status:   Future    Number of Occurrences:   1    Standing Expiration Date:   01/20/2020  . Iron and TIBC    Standing Status:   Future    Number of Occurrences:   1    Standing Expiration Date:   01/20/2020  . Ferritin    Standing Status:   Future    Number of Occurrences:   1    Standing Expiration Date:   01/20/2020  . Technologist smear review    Standing Status:   Future    Number of Occurrences:   1    Standing Expiration Date:   01/20/2020  . Multiple  Myeloma Panel (SPEP&IFE  w/QIG)    Standing Status:   Future    Number of Occurrences:   1    Standing Expiration Date:   01/20/2020  . Kappa/lambda light chains    Standing Status:   Future    Number of Occurrences:   1    Standing Expiration Date:   01/20/2020  . Retic Panel    Standing Status:   Future    Number of Occurrences:   1    Standing Expiration Date:   01/20/2020  . CBC with Differential/Platelet    Standing Status:   Future    Standing Expiration Date:   01/20/2020  . Ferritin    Standing Status:   Future    Standing Expiration Date:   01/20/2020  . Iron and TIBC    Standing Status:   Future    Standing Expiration Date:   01/20/2020    All questions were answered. The patient knows to call the clinic with any problems questions or concerns. Roberts Gaudy, NP  Return of visit: 6 weeks Thank you for this kind referral and the opportunity to participate in the care of this patient. A copy of today's note is routed to referring provider  Total face to face encounter time for this patient visit was 33mn. >50% of the time was  spent in counseling and coordination of care.    ZEarlie Server MD, PhD 01/20/2019

## 2019-01-20 NOTE — Progress Notes (Signed)
New patient here today for evaluation of anemia. She states that she has been feeling more tired for the last several weeks. She started taking ferrous sulfate 325 mg daily about a week ago. She denies having any side effects from the iron. She denies having any pain, but she does have shortness of breath. She states that she has a history of pulmonary fibrosis, so the shortness of breath is not new for her.

## 2019-01-21 LAB — KAPPA/LAMBDA LIGHT CHAINS
Kappa free light chain: 19.2 mg/L (ref 3.3–19.4)
Kappa, lambda light chain ratio: 1.39 (ref 0.26–1.65)
Lambda free light chains: 13.8 mg/L (ref 5.7–26.3)

## 2019-01-22 LAB — MULTIPLE MYELOMA PANEL, SERUM
Albumin SerPl Elph-Mcnc: 3.7 g/dL (ref 2.9–4.4)
Albumin/Glob SerPl: 1.4 (ref 0.7–1.7)
Alpha 1: 0.3 g/dL (ref 0.0–0.4)
Alpha2 Glob SerPl Elph-Mcnc: 0.8 g/dL (ref 0.4–1.0)
B-Globulin SerPl Elph-Mcnc: 0.9 g/dL (ref 0.7–1.3)
Gamma Glob SerPl Elph-Mcnc: 0.7 g/dL (ref 0.4–1.8)
Globulin, Total: 2.7 g/dL (ref 2.2–3.9)
IgA: 162 mg/dL (ref 64–422)
IgG (Immunoglobin G), Serum: 849 mg/dL (ref 586–1602)
IgM (Immunoglobulin M), Srm: 62 mg/dL (ref 26–217)
Total Protein ELP: 6.4 g/dL (ref 6.0–8.5)

## 2019-02-08 ENCOUNTER — Encounter: Payer: Self-pay | Admitting: Cardiology

## 2019-02-08 ENCOUNTER — Other Ambulatory Visit: Payer: Self-pay

## 2019-02-08 ENCOUNTER — Ambulatory Visit (INDEPENDENT_AMBULATORY_CARE_PROVIDER_SITE_OTHER): Payer: Medicare Other | Admitting: Cardiology

## 2019-02-08 ENCOUNTER — Telehealth: Payer: Self-pay | Admitting: Emergency Medicine

## 2019-02-08 VITALS — BP 177/95 | HR 80 | Temp 97.9°F | Ht 62.5 in | Wt 128.0 lb

## 2019-02-08 DIAGNOSIS — R55 Syncope and collapse: Secondary | ICD-10-CM | POA: Diagnosis not present

## 2019-02-08 DIAGNOSIS — I10 Essential (primary) hypertension: Secondary | ICD-10-CM | POA: Diagnosis not present

## 2019-02-08 DIAGNOSIS — R002 Palpitations: Secondary | ICD-10-CM

## 2019-02-08 DIAGNOSIS — R0602 Shortness of breath: Secondary | ICD-10-CM

## 2019-02-08 NOTE — Patient Instructions (Addendum)
Medication Instructions:  Your Physician recommend you continue on your current medication as directed.    If you need a refill on your cardiac medications before your next appointment, please call your pharmacy.   Lab work: None  Testing/Procedures: None  Follow-Up: At [facility], you and your health needs are our priority.  As part of our continuing mission to provide you with exceptional heart care, we have created designated Provider Care Teams.  These Care Teams include your primary Cardiologist (physician) and Advanced Practice Providers (APPs -  Physician Assistants and Nurse Practitioners) who all work together to provide you with the care you need, when you need it. You will need a follow up appointment in 3 months.  Please call our office 2 months in advance to schedule this appointment.  You may see Buford Dresser, MD or one of the following Advanced Practice Providers on your designated Care Team:   Rosaria Ferries, PA-C . Jory Sims, DNP, ANP

## 2019-02-08 NOTE — Progress Notes (Signed)
Cardiology Office Note:    Date:  02/08/2019   ID:  Tina Calderon, DOB [birth year], MRN [id number]  PCP:  [name], NP  Cardiologist:  Buford Dresser, MD PhD  Referring MD: [name], NP   CC: Follow up  History of Present Illness:    Tina Calderon is a 78 y.o. female with a hx of pulmonary fibrosis who is seen in follow up for evaluation and management of tachycardia, fatigue, exertional intolerance.  Summary from initial visit 01/22/18: She presented to [facility] ER on 01/12/18 with palpitations and shortness of breath. At pulmonary rehab that morning, her heart rate was noted to be 120 bpm (patient states this was at rest). In the ER, her ECG showed sinus tachycardia at 104 bpm, CT was negative for PE, negative troponin. She feels exhausted with basic everyday activities. For instance, she cooks breakfast every morning for herself and her husband. By the time she is done, she has no appetite. Often she has to lie down to rest for a time after. She is supposed to do about 50 minutes of near continuous cardiovascular activity at pulmonary rehab, and she is completely exhausted by the end of this. She endorses good food and fluid intake despite her fatigue. No infectious symptoms or recent illnesses.  Today: Breathing worse. Audible wheezing, conversationally dysnpeic today. Has been gradually worsening. Waiting on 6MWD and PFTs. No productive cough, no syncope. Can barely showed, can't do much else as she feels extremely wiped out and fatigued.   Reviewed results of monitor. Tolerating increased dose of metoprolol, has not changed breathing. Feels occasional hard beats, hasn't seen any as rapid as before.   Mild soreness mid chest, worst with palpation. Reviewed prior stress test results. No edema.  She is awaiting PFTs and 6MWD with Dr. Lamonte Sakai. She feels terrible and is not sure when her next appt with Dr. Lamonte Sakai is supposed to be (I did not see any scheduled).  Discussed ER but she prefers to wait to see Dr. Lamonte Sakai.  On ambulating her (with nurse assistance) she only went about 20 feet before she nearly had syncope. We laid her down immediately. She was conscious throughout. Went to get pulse ox/BP cuff/glucometer. Her pulse ox was initially 79% on room air, rapidly improved to upper 90s even before we could get her on O2. BP 177/95, HR 80, blood sugar 105. We had her lie down until she felt better. With speaking her O2 sats dropped into upper 80s. BP returned to her prior baseline. She felt better sitting still, drinking water. She did eat breakfast this AM.  Past Medical History:  Diagnosis Date  . Anginal pain ([place])   . Cancer ([place])   . CHF (congestive heart failure) ([place]) 01/22/2012  . Chronic fatigue 07/15/2014  . CVA (cerebral vascular accident) ([place]) 04/18/2018  . GERD (gastroesophageal reflux disease) 05/12/2017  . Hypertension   . Hypothyroidism   . ILD (interstitial lung disease) ([place]) 02/04/2012  . IPF (idiopathic pulmonary fibrosis) ([place]) 2013  . Mild intermittent asthma 01/29/2016  . Pelvic relaxation   . Prolapse of bladder   . Shortness of breath   . Thyroid disease     Past Surgical History:  Procedure Laterality Date  . ANTERIOR AND POSTERIOR REPAIR  12/05/2011   Procedure: ANTERIOR (CYSTOCELE) AND POSTERIOR REPAIR (RECTOCELE);  Surgeon: [name], MD;  Location: [facility];  Service: Gynecology;  Laterality: N/A;  with cysto  . Bladder tact  15 years ago  .  LEFT HEART CATHETERIZATION WITH CORONARY ANGIOGRAM N/A 01/22/2012   Procedure: LEFT HEART CATHETERIZATION WITH CORONARY ANGIOGRAM;  Surgeon: [name], MD;  Location: [facility] CATH LAB;  Service: Cardiovascular;  Laterality: N/A;  . NECK SURGERY  2010  . SHOULDER SURGERY    . thyroid disease    . THYROID LOBECTOMY Left 07/17/2018   Procedure: LEFT THYROID LOBECTOMY;  Surgeon: [name], MD;  Location: [facility];  Service: General;  Laterality: Left;  . TONSILLECTOMY    .  VAGINAL HYSTERECTOMY  15 years ago    Current Medications: Current Outpatient Medications on File Prior to Visit  Medication Sig  . acetaminophen (TYLENOL) 500 MG tablet Take 1,000 mg by mouth every 6 (six) hours as needed for mild pain or headache.   [name] amLODipine (NORVASC) 5 MG tablet TAKE 1 TABLET BY MOUTH EVERY DAY FOR BLOOD PRESSURE  . atorvastatin (LIPITOR) 40 MG tablet Take 1 tablet (40 mg total) by mouth at bedtime. For cholesterol.  [name] CALCIUM CARBONATE-VITAMIN D PO Take 1 tablet by mouth daily.   . clopidogrel (PLAVIX) 75 MG tablet Take 1 tablet (75 mg total) by mouth daily. For stroke prevention.  [name] CRANBERRY PO Take 1 capsule by mouth every Monday, Wednesday, and Friday.   . ESBRIET 801 MG TABS Take 1 tablet by mouth 3 (three) times daily.  . ferrous sulfate 325 (65 FE) MG tablet Take 325 mg by mouth daily.  [name] levothyroxine (SYNTHROID) 100 MCG tablet Take 1 tablet by mouth every morning on an empty stomach. No food or other medications for 30 minutes.  [name] loratadine (CLARITIN) 10 MG tablet Take 10 mg by mouth daily.   . metoprolol succinate (TOPROL-XL) 25 MG 24 hr tablet Take 1 tablet (25 mg total) by mouth daily.  . Multiple Vitamins-Minerals (CENTRUM SILVER PO) Take 1 tablet by mouth daily.  [name] omeprazole (PRILOSEC) 20 MG capsule TAKE 1 CAPSULE BY MOUTH EVERY DAY   No current facility-administered medications on file prior to visit.      Allergies:   Dilaudid [hydromorphone] and Crestor [rosuvastatin calcium]   Social History   Socioeconomic History  . Marital status: Married    Spouse name: Not on file  . Number of children: Not on file  . Years of education: Not on file  . Highest education level: Not on file  Occupational History  . Not on file  Social Needs  . Financial resource strain: Not on file  . Food insecurity    Worry: Not on file    Inability: Not on file  . Transportation needs    Medical: Not on file    Non-medical: Not on file  Tobacco Use  .  Smoking status: Never Smoker  . Smokeless tobacco: Never Used  Substance and Sexual Activity  . Alcohol use: No  . Drug use: No  . Sexual activity: Never    Birth control/protection: Post-menopausal, Surgical    Comment: HYST  Lifestyle  . Physical activity    Days per week: Not on file    Minutes per session: Not on file  . Stress: Not on file  Relationships  . Social Herbalist on phone: Not on file    Gets together: Not on file    Attends religious service: Not on file    Active member of club or organization: Not on file    Attends meetings of clubs or organizations: Not on file    Relationship status: Not on file  Other  Topics Concern  . Not on file  Social History Narrative   Married.   2 children, 5 grandchildren.   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   Enjoys puzzles, sewing, traveling, reading.      Family History: The patient's family history includes Aneurysm in her father; Cancer in her brother; Coronary artery disease in her mother; Dementia in her mother; Hypertension in her mother; Pulmonary fibrosis in her brother and sister; Sleep apnea in an other family member; Stroke in her mother and another family member; Thyroid disease in her mother.  ROS:   Constitutional: Negative for chills, fever, night sweats, unintentional weight loss. Positive for malaise, fatigue HENT: Negative for ear pain and hearing loss.   Eyes: Negative for loss of vision and eye pain.  Respiratory: Positive for severe shortness of breath, dry cough Cardiovascular: See HPI. Gastrointestinal: Negative for abdominal pain, melena, and hematochezia.  Genitourinary: Negative for dysuria and hematuria.  Musculoskeletal: Negative for falls and myalgias.  Skin: Negative for itching and rash.  Neurological: Negative for focal weakness, focal sensory changes and loss of consciousness.  Endo/Heme/Allergies: Does not bruise/bleed easily. Please see the history of present illness.   Additional pertinent ROS:  EKGs/Labs/Other Studies Reviewed:    The following studies were reviewed today: MPI Apr 30, 2018  Nuclear stress EF: 68%.  Blood pressure demonstrated a normal response to exercise.  There was no ST segment deviation noted during stress.  The study is normal.  This is a low risk study.  The left ventricular ejection fraction is hyperdynamic (>65%).  Echo 04/19/18 - Left ventricle: The cavity size was normal. Wall thickness was   increased in a pattern of mild LVH. Systolic function was   vigorous. The estimated ejection fraction was in the range of 65%   to 70%. Wall motion was normal; there were no regional wall   motion abnormalities. Doppler parameters are consistent with   abnormal left ventricular relaxation (grade 1 diastolic   dysfunction). Doppler parameters are consistent with   indeterminate ventricular filling pressure. - Aortic valve: Mildly calcified annulus. Trileaflet. - Mitral valve: Mildly calcified annulus. - Left atrium: The atrium was mildly dilated. - Atrial septum: No defect or patent foramen ovale was identified. - Tricuspid valve: There was moderate regurgitation. - Pulmonary arteries: PA peak pressure: 63 mm Hg (S). - Pericardium, extracardiac: A trivial pericardial effusion was   identified.  Echo 01/31/18 Study Conclusions  - Left ventricle: The cavity size was normal. Wall thickness was   normal. Systolic function was normal. The estimated ejection   fraction was in the range of 60% to 65%. Wall motion was normal;   there were no regional wall motion abnormalities. Doppler   parameters are consistent with abnormal left ventricular   relaxation (grade 1 diastolic dysfunction). - Aortic valve: Mildly calcified annulus. Normal thickness   leaflets. Valve area (VTI): 1.83 cm^2. Valve area (Vmax): 2.06   cm^2. Valve area (Vmean): 1.74 cm^2. - Mitral valve: Mildly calcified annulus. Normal thickness leaflets   . - Left  atrium: The atrium was severely dilated. - Right ventricle: The cavity size was mildly dilated. - Technically adequate study.  Elevated RA pressures, RV-RA gradient 47 mmHg.   Monitor 01/29/18 Ventricular tachycardia-nonsustained-up to 5 beats Atrial tachycardia-37 episodes    Symptoms of fluttering sinus rhythm;                              rare PAC  Isolated PVC Symptoms of shortness of breath PACs/PVCs  Long term monitor reviewed. All of her patient triggered events are sinus rhythm, occasionally tachycardia, rare isolated ectopy. She had one episode of 5 beats of NSVT without symptoms, and she had one brief episode of 3 beats of SVT. Rest are isolated atrial ectopy and rare PVCs.  CT angio chest 01/12/18 IMPRESSION: 1. No acute findings. No pulmonary embolism seen. No evidence of pneumonia or pulmonary edema. 2. 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 3. Large hiatal hernia, stable. 4. Cardiomegaly. 5. Coronary artery calcifications.  EKG:  EKG personally reviewed,  Today is NSR with PRWP  Recent Labs: 04/18/2018: B Natriuretic Peptide 100.9; Magnesium 1.8 12/03/2018: ALT 14; TSH 0.79 12/31/2018: BUN 18; Creatinine, Ser 0.92; Potassium 4.6; Sodium 138 01/20/2019: Hemoglobin 8.6; Platelets 303  Recent Lipid Panel    Component Value Date/Time   CHOL 137 12/31/2018 1412   TRIG 162.0 (H) 12/31/2018 1412   HDL 51.20 12/31/2018 1412   CHOLHDL 3 12/31/2018 1412   VLDL 32.4 12/31/2018 1412   LDLCALC 54 12/31/2018 1412    Physical Exam:    VS:  BP (!) 177/95   Pulse 80   Temp 97.9 F (36.6 C)   Ht 5' 2.5" (1.588 m)   Wt 128 lb (58.1 kg)   SpO2 (!) 79%   BMI 23.04 kg/m     Wt Readings from Last 3 Encounters:  02/08/19 128 lb (58.1 kg)  01/20/19 125 lb 1.6 oz (56.7 kg)  01/04/19 128 lb 4 oz (58.2 kg)    GEN: Frail  appearing, conversationally dyspneic HEENT: Normal, moist mucous membranes NECK: No JVD CARDIAC: regular rhythm, normal S1 and S2, no murmurs, rubs, gallops.  VASCULAR: Radial and DP pulses 2+ bilaterally. No carotid bruits RESPIRATORY:  Velcro crackles most notable in mid to lower fields. No clear wheezing. No stridor. ABDOMEN: Soft, non-tender, non-distended MUSCULOSKELETAL:  Ambulates independently but slowly. SKIN: Warm and dry, no edema NEUROLOGIC:  Alert and oriented x 3. No focal neuro deficits noted. PSYCHIATRIC:  Normal affect    ASSESSMENT:    1. Shortness of breath   2. Near syncope   3. Heart palpitations   4. Essential hypertension    PLAN:    Shortness of breath: much worse today. She is the most dyspneic I have seen her. On walking only a few feet to the checkout desk, she became lightheaded. Her O2 sat (around a minute after lying down) was 79%. It rapidly rose with just resting, but speaking had her drop to the upper 80s.  I discussed going to the ER with her, but she is very nervous about Covid. She drove herself today, so we called family and had them come to drive her home. She asked that I call Dr. Agustina Caroli office, which I did, and ask to be seen for her testing ASAP.  With her O2 drop with minimal ambulation, I suspect she will need home O2.   I instructed her to go to ER with any worsening symptoms. ECG today is NSR, PRWP  palpitations/tachycardia: improved on higher dose of metoprolol without worsening her breathing (had been worsening even before metoprolol change). Continue  Chest tightness/precordial pain: has had recent nuclear stress test, without ischemia -suspect non-cardiac in etiology  Hypertension: -slightly elevated today, but she is very uncomfortable. Has previously been well controlled. No change today.  Secondary prevention (CVA): -recommend  heart healthy/Mediterranean diet, with whole grains, fruits, vegetable, fish, lean meats, nuts, and  olive oil. Limit salt. -recommend moderate walking, 3-5 times/week for 30-50 minutes each session. Aim for at least 150 minutes.week. Goal should be pace of 3 miles/hours, or walking 1.5 miles in 30 minutes -recommend avoidance of tobacco products. Avoid excess alcohol.  Plan for follow up: 3 mos (ideally after lung testing) or sooner PRN. I am very concerned about her but her cardiac issues appear well controlled.  TIME SPENT WITH PATIENT: 50 minutes of direct patient care. More than 50% of that time was spent on coordination of care and counseling regarding shortness of breath, evaluation of her near syncope in the office.  Buford Dresser, MD, PhD [place]  [facility]   Medication Adjustments/Labs and Tests Ordered: Current medicines are reviewed at length with the patient today.  Concerns regarding medicines are outlined above.  Orders Placed This Encounter  Procedures  . EKG 12-Lead   No orders of the defined types were placed in this encounter.   Patient Instructions  Medication Instructions:  Your Physician recommend you continue on your current medication as directed.    If you need a refill on your cardiac medications before your next appointment, please call your pharmacy.   Lab work: None  Testing/Procedures: None  Follow-Up: At [facility], you and your health needs are our priority.  As part of our continuing mission to provide you with exceptional heart care, we have created designated Provider Care Teams.  These Care Teams include your primary Cardiologist (physician) and Advanced Practice Providers (APPs -  Physician Assistants and Nurse Practitioners) who all work together to provide you with the care you need, when you need it. You will need a follow up appointment in 3 months.  Please call our office 2 months in advance to schedule this appointment.  You may see Buford Dresser, MD or one of the following Advanced Practice Providers on your  designated Care Team:   Rosaria Ferries, PA-C . Jory Sims, DNP, ANP     Signed, Buford Dresser, MD PhD 02/08/2019 1:33 PM    [facility]

## 2019-02-08 NOTE — Telephone Encounter (Signed)
Message left by Dr. Venida Jarvis (cardiology) that patient was seen in the office today and needed PFT and OV with Dr. Lamonte Sakai as soon as possible.  Patient is due to return in August 2020 but do not see any appointments scheduled for PFT or OV.  Will route to [facility] to schedule patient as soon as possible due to Dr. Judeth Cornfield concerns related to desaturation and need for PFT and evaluation.    CT completed on 01/15/19 Routed to [facility] for scheduling as soon as possible.    [name] this was noted in last OV with Dr. Lamonte Sakai on 12/03/18: Walking oximetry on RA today We will plan to repeat your Ct chest in August 2020 Lab work today Continue pirfenidone as you have been taking it We will arrange for a 6 minute walk Follow with Dr Lamonte Sakai in August after the CT chest to review. We will perform PFT on the same day.   Also, attempted to contact patient to let her know we would be contacting her to schedule these appointments.  Patient was not home. Left message with her spouse (on dpr) regarding this.Spouse acknowledged understanding.

## 2019-02-11 NOTE — Telephone Encounter (Signed)
Scheduled 6 min walk on 9/15, pft on 9/24 -pr

## 2019-02-12 ENCOUNTER — Other Ambulatory Visit: Payer: Self-pay | Admitting: Emergency Medicine

## 2019-02-22 ENCOUNTER — Ambulatory Visit: Payer: Medicare Other | Admitting: Adult Health

## 2019-02-23 ENCOUNTER — Other Ambulatory Visit: Payer: Self-pay

## 2019-02-23 ENCOUNTER — Ambulatory Visit (INDEPENDENT_AMBULATORY_CARE_PROVIDER_SITE_OTHER): Payer: Medicare Other

## 2019-02-23 ENCOUNTER — Encounter: Payer: Self-pay | Admitting: Pulmonary Disease

## 2019-02-23 ENCOUNTER — Ambulatory Visit (INDEPENDENT_AMBULATORY_CARE_PROVIDER_SITE_OTHER): Payer: Medicare Other | Admitting: Pulmonary Disease

## 2019-02-23 VITALS — BP 122/76 | HR 91 | Temp 97.2°F

## 2019-02-23 DIAGNOSIS — D649 Anemia, unspecified: Secondary | ICD-10-CM | POA: Diagnosis not present

## 2019-02-23 DIAGNOSIS — I272 Pulmonary hypertension, unspecified: Secondary | ICD-10-CM

## 2019-02-23 DIAGNOSIS — I5032 Chronic diastolic (congestive) heart failure: Secondary | ICD-10-CM

## 2019-02-23 DIAGNOSIS — J84112 Idiopathic pulmonary fibrosis: Secondary | ICD-10-CM

## 2019-02-23 DIAGNOSIS — J4520 Mild intermittent asthma, uncomplicated: Secondary | ICD-10-CM | POA: Diagnosis not present

## 2019-02-23 DIAGNOSIS — J301 Allergic rhinitis due to pollen: Secondary | ICD-10-CM

## 2019-02-23 DIAGNOSIS — J9611 Chronic respiratory failure with hypoxia: Secondary | ICD-10-CM

## 2019-02-23 LAB — HEPATIC FUNCTION PANEL
ALT: 13 U/L (ref 0–35)
AST: 20 U/L (ref 0–37)
Albumin: 4.1 g/dL (ref 3.5–5.2)
Alkaline Phosphatase: 47 U/L (ref 39–117)
Bilirubin, Direct: 0 mg/dL (ref 0.0–0.3)
Total Bilirubin: 0.3 mg/dL (ref 0.2–1.2)
Total Protein: 6.8 g/dL (ref 6.0–8.3)

## 2019-02-23 MED ORDER — BUDESONIDE-FORMOTEROL FUMARATE 80-4.5 MCG/ACT IN AERO: 2.0000 | INHALATION_SPRAY | Freq: Two times a day (BID) | RESPIRATORY_TRACT | 0 refills | Status: DC

## 2019-02-23 NOTE — Patient Instructions (Addendum)
You were seen today by Lauraine Rinne, NP  for:   1. IPF (idiopathic pulmonary fibrosis) (HCC)  Continue Esbriet  We will work to get you seen for an ILD consult with Dr. [name] who you saw on 2014  Proceed forward with pulmonary function test to see Dr. Lamonte Sakai next week  We will discharge you today on home oxygen he needs 3 L continuous O2 with exertion.  I would recommend temporarily for right now 2 L of O2 at rest.  2. Chronic diastolic congestive heart failure ([place])  Lab work today  3. Mild intermittent asthma without complication  Trial of Symbicort 80 >>> 2 puffs in the morning right when you wake up, rinse out your mouth after use, 12 hours later 2 puffs, rinse after use >>> Take this daily, no matter what >>> This is not a rescue inhaler   Do not take 48 hours before your scheduled breathing test  4. Anemia, unspecified type  Blood work today, proceed forward with also scheduled blood work next week  5. Allergic rhinitis due to pollen, unspecified seasonality  Continue daily Claritin  6. Chronic respiratory failure with hypoxia (HCC)  Continue oxygen therapy as prescribed  >>>maintain oxygen saturations greater than 88 percent  >>>if unable to maintain oxygen saturations please contact the office  >>>do not smoke with oxygen  >>>can use nasal saline gel or nasal saline rinses to moisturize nose if oxygen causes dryness   7. Pulmonary HTN (HCC)  Proceed forward with pulmonary function test next week Keep scheduled appointment with Dr. Lamonte Sakai We will get you scheduled as a consult for IPF with Dr. [name] this week.   We recommend today:  No orders of the defined types were placed in this encounter.  No orders of the defined types were placed in this encounter.  No orders of the defined types were placed in this encounter.   Follow Up:    Return in about 1 week (around 03/02/2019), or if symptoms worsen or fail to improve, for Follow up with Dr.  Purnell Shoemaker, Follow up with Dr. Lamonte Sakai.   Please do your part to reduce the spread of COVID-19:      Reduce your risk of any infection  and COVID19 by using the similar precautions used for avoiding the common cold or flu:  [name] Wash your hands often with soap and warm water for at least 20 seconds.  If soap and water are not readily available, use an alcohol-based hand sanitizer with at least 60% alcohol.  . If coughing or sneezing, cover your mouth and nose by coughing or sneezing into the elbow areas of your shirt or coat, into a tissue or into your sleeve (not your hands). Langley Gauss A MASK when in public  . Avoid shaking hands with others and consider head nods or verbal greetings only. . Avoid touching your eyes, nose, or mouth with unwashed hands.  . Avoid close contact with people who are sick. . Avoid places or events with large numbers of people in one location, like concerts or sporting events. . If you have some symptoms but not all symptoms, continue to monitor at home and seek medical attention if your symptoms worsen. . If you are having a medical emergency, call 911.   [place] / e-Visit: [email or web address]         [facility]: [facility]: [phone]  [facility]: [phone]     It is flu season:   >>> Best ways to protect herself from the flu: Receive the yearly flu vaccine, practice good hand hygiene washing with soap and also using hand sanitizer when available, eat a nutritious meals, get adequate rest, hydrate appropriately   Please contact the office if your symptoms worsen or you have concerns that you are not improving.   Thank you for choosing [facility] for your healthcare, and for allowing [place] to partner with you on your healthcare journey. I am thankful to be able to provide  care to you today.   Wyn Quaker FNP-C     Home Oxygen Use, Adult When a medical condition keeps you from getting enough oxygen, your health care provider may instruct you to take extra oxygen at home. Your health care provider will let you know:  When to take oxygen.  For how long to take oxygen.  How quickly oxygen should be delivered (flow rate), in liters per minute (LPM or L/M). Home oxygen can be given through:  A mask.  A nasal cannula. This is a device or tube that goes in the nostrils.  A transtracheal catheter. This is a small, flexible tube placed in the trachea.  A tracheostomy. This is a surgically made opening in the trachea. These devices are connected with tubing to an oxygen source, such as:  A tank. Tanks hold oxygen in gas form. They must be replaced when the oxygen is used up.  A liquid oxygen device. This holds oxygen in liquid form. It must be replaced when the oxygen is used up.  An oxygen concentrator machine. This filters oxygen in the room. It uses electricity, so you must have a backup cylinder of oxygen in case the power goes out. Supplies needed: To use oxygen, you will need:  A mask, nasal cannula, transtracheal catheter, or tracheostomy.  An oxygen tank, a liquid oxygen device, or an oxygen concentrator.  The tape that your health care provider recommends (optional). If you use a transtracheal catheter and your prescribed flow rate is 1 LPM or greater, you will also need a humidifier. Risks and complications  Fire. This can happen if the oxygen is exposed to a heat source, flame, or spark.  Injury to skin. This can happen if liquid oxygen touches your skin.  Organ damage. This can happen if you get too little oxygen. How to use oxygen Your health care provider or a representative from your [place] will show you how to use your oxygen device. Follow her or his instructions. The instructions may look something like this: 1.  Wash your hands. 2. If you use an oxygen concentrator, make sure it is plugged in. 3. Place one end of the tube into the port on the tank, device, or machine. 4. Place the mask over your nose and mouth. Or, place the nasal cannula and secure it with tape if instructed. If you use a tracheostomy or transtracheal catheter, connect it to the oxygen source as directed. 5. Make sure the liter-flow setting on the machine is at the level prescribed by your health care provider. 6. Turn on the machine or adjust the knob on the tank or device to the correct liter-flow setting. 7. When you are done, turn off and unplug the machine, or turn the knob to OFF. How to clean and care for the oxygen supplies Nasal cannula  Clean it with a warm, wet cloth daily  or as needed.  Wash it with a liquid soap once a week.  Rinse it thoroughly once or twice a week.  Replace it every 2-4 weeks.  If you have an infection, such as a cold or pneumonia, change the cannula when you get better. Mask  Replace it every 2-4 weeks.  If you have an infection, such as a cold or pneumonia, change the mask when you get better. Humidifier bottle  Wash the bottle between each refill: ? Wash it with soap and warm water. ? Rinse it thoroughly. ? Disinfect it and its top. ? Air-dry it.  Make sure it is dry before you refill it. Oxygen concentrator  Clean the air filter at least twice a week according to directions from your home medical equipment and service company.  Wipe down the cabinet every day. To do this: ? Unplug the unit. ? Wipe down the cabinet with a damp cloth. ? Dry the cabinet. Other equipment  Change any extra tubing every 1-3 months.  Follow instructions from your health care provider about taking care of any other equipment. Safety tips Fire safety tips   Keep your oxygen and oxygen supplies at least 5 ft away from sources of heat, flames, and sparks at all times.  Do not allow smoking near  your oxygen. Put up "no smoking" signs in your home. Avoid smoking areas when in public.  Do not use materials that can burn (are flammable) while you use oxygen.  When you go to a restaurant with portable oxygen, ask to be seated in the nonsmoking section.  Keep a Data processing manager close by. Let your fire department know that you have oxygen in your home.  Test your home smoke detectors regularly. Traveling  Secure your oxygen tank in the vehicle so that it does not move around. Follow instructions from your medical device company about how to safely secure your tank.  Make sure you have enough oxygen for the amount of time you will be away from home.  If you are planning air travel, contact the airline to find out if they allow the use of an approved portable oxygen concentrator. You may also need documents from your health care provider and medical device company before you travel. General safety tips  If you use an oxygen cylinder, make sure it is in a stand or secured to an object that will not move (fixed object).  If you use liquid oxygen, make sure its container is kept upright.  If you use an oxygen concentrator: ? [employer] movement psychotherapist company. Make sure you are given priority service in the event that your power goes out. ? Avoid using extension cords, if possible. Follow these instructions at home:  Use oxygen only as told by your health care provider.  Do not use alcohol or other drugs that make you relax (sedating drugs) unless instructed. They can slow down your breathing rate and make it hard to get in enough oxygen.  Know how and when to order a refill of oxygen.  Always keep a spare tank of oxygen. Plan ahead for holidays when you may not be able to get a prescription filled.  Use water-based lubricants on your lips or nostrils. Do not use oil-based products like petroleum jelly.  To prevent skin irritation on your cheeks or behind your ears, tuck some gauze under  the tubing. Contact a health care provider if:  You get headaches often.  You have shortness of breath.  You have a  lasting cough.  You have anxiety.  You are sleepy all the time.  You develop an illness that affects your breathing.  You cannot exercise at your regular level.  You are restless.  You have difficult or irregular breathing, and it is getting worse.  You have a fever.  You have persistent redness under your nose. Get help right away if:  You are confused.  You have blue lips or fingernails.  You are struggling to breathe. Summary  Your health care provider or a representative from your [place] will show you how to use your oxygen device. Follow her or his instructions.  If you use an oxygen concentrator, make sure it is plugged in.  Make sure the liter-flow setting on the machine is at the level prescribed by your health care provider.  Keep your oxygen and oxygen supplies at least 5 ft away from sources of heat, flames, and sparks at all times. This information is not intended to replace advice given to you by your health care provider. Make sure you discuss any questions you have with your health care provider. Document Released: 08/17/2003 Document Revised: 11/13/2017 Document Reviewed: 12/19/2015 Elsevier Patient Education  2020 [employer].

## 2019-02-23 NOTE — Progress Notes (Signed)
_0  ID: Tina Calderon, female    DOB: July 16, 1940, 78 y.o.   MRN: [id number]  Chief Complaint  Patient presents with  . 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 Negative CTD workup in 2014. Family history of pulmonary fibrosis (sibling)  PMH: Hypothyroidism, anemia, chronic fatigue, hypertension, GERD, prediabetes, history of CVA, chronic diastolic heart failure Smoker/ Smoking History: Never smoker Maintenance:  Esbriet  Pt of: Dr. BLamonte Sakai   02/23/2019  - Visit   78year old female never smoker presenting to our office today to complete a 6-minute walk.  Unfortunately during the 6-minute walk patient's oxygen levels dropped and she required 3 L O2 continuously.  Patient has been routinely followed in our office for IPF.  She is managed on Esbriet.  Patient has familial history of pulmonary fibrosis both of her siblings having pulmonary fibrosis and dying from it.  Patient is a never smoker.  Patient has never needed oxygen in the past.  2019 echocardiogram showed elevated pulmonary pressures of 63, 2017 pulmonary function test showed FVC of 1.44 as well as a DLCO 12.65.  2014 connective tissue lab work was negative.  Patient was also consulted with Dr. RChase Callerin 2014.  Patient has been an active member in the pulmonary fibrosis support group.  Patient has planned appointments with Dr. BLamonte Sakainext week with a pulmonary function test.  Patient was completing a 6-minute walk today because over the last 3 to 6 months she feels that she is had worsened shortness of breath.  Patient does admit that during the SARS-CoV-2 pandemic she has not been as active as her and her husband have been social distancing and practicing home isolation as he has COPD.  She reports that she gets short of breath just was serving food in her kitchen in her small  apartment.  Patient is quite tearful today regarding needing oxygen on 6-minute walk.   Tests:   01/15/2019-CT chest high-res-redemonstrated moderate pulmonary fibrosis in a pattern with apical to basilar gradient featuring peripheral interstitial opacity and suggestion of nodularity, mild traction bronchiectasis and occasional areas of subpleural bronchiolectasis in the bases, no evidence of honeycombing, findings remain indeterminate for UIP and differential considerations include both UIP as well as chronic fibrosing NSIP.  Findings are relatively stable from 2019 CT.  04/19/2018-echocardiogram-LV ejection fraction 65 to 70%, PA P pressure 63  10/23/2015-pulmonary function test- FVC 1.44 (56% predicted), postbronchodilator ratio 86, postbronchodilator FEV1 1.26 (66% predicted), no bronchodilator response, mid flow reversibility, DLCO 12.65   CTD Labwork (2014) - neg ANA, ANCA, RA factor , nml ESR , CRP  SIX MIN WALK 02/23/2019 12/03/2018 12/01/2017 05/01/2016 10/29/2013 12/04/2012 02/21/2012  Medications norvasc 57m plavix 752msynthroid 100m19mclaritin 10mg39mprol-xl 25mg,58m, prilosec 20mg. 52miet 801 mg @ 0730 - Levothyroxine, Esbriet, Loratdine, and Omeprazole all taken at 6:45 AM Synthroid 88mcg, 37mitin 10mg, Pr74mec 20mg- tak60mt 0630. Esbriet 801mg taken72m0800 - - -  Supplimental Oxygen during Test? (L/min) Yes _1  No  Laps 10 - 9 9 - - -  Partial Lap (in Meters) 23 - 30 12 - - -  Baseline BP (sitting) 128/74 - 120/78 140/82 - - -  Baseline Heartrate 94 - 101 100 - - -  Baseline Dyspnea (Borg Scale) 2 - 1 3 - - -  Baseline Fatigue (Borg Scale) 2 - 1 2 - - -  Baseline SPO2 97 - 98 97 - - -  BP (sitting) 160/70 - 140/76 192/98 - - -  Heartrate 126 - 129 139 - - -  Dyspnea (Borg Scale) 10 - 3 3 - - -  Fatigue (Borg Scale) 10 - 3 4 - - -  SPO2 87 - 96 92 - - -  BP (sitting) 150/80 - 132/78 170/92 - - -  Heartrate 103 - 106 113 - - -  SPO2 100 - 99 98 - - -   Stopped or Paused before Six Minutes Yes - No No - - -  Other Symptoms at end of Exercise patient left weak and short of breath. O2sats 80% patient placed on 2L of @ to finish out the last 2:38 of walk- at end of walk still 87% on 2L - - - - - -  Interpretation Hip pain;Leg pain - Hip pain - - - -  Distance Completed 363 - 462 444 - - -  Tech Comments: patient stared walk on room air at 97%. patient walked and had to stop with 2:38 left in her 6 minutes. O2 sat 80%. O2 at 2L placed on patient to finish rest of walk. when 2:38 was up patient stopped O2 check at 87% still on 2L all vitals taken for walk, then walked patient another 1.5 laps to make sure she could maintime O2 sats at 3L at end of walk patient maintained sats 94%. patient then scheduled with NP Wyn Quaker for follow up on shortness of breath and desaturation. - patient was very anxious as well as emotional before test. Pt ambulated with a steady gait.  - pt only completed 2 laps, stated she was "very short winded"//ldc Pt completed the walk with little difficulties...increased sob with increased heart rate.     FENO:  No results found for: NITRICOXIDE  PFT: PFT Results Latest Ref Rng & Units 10/23/2015 03/17/2015 01/03/2015 07/15/2014 02/15/2014  FVC-Pre L 1.44 1.47 1.52 1.59 1.58  FVC-Predicted Pre % 56 57 59 61 60  FVC-Post L 1.47 - 1.49 1.58 1.55  FVC-Predicted Post % 57 - 57 60 59  Pre FEV1/FVC % % 85 84 83 84 84  Post FEV1/FCV % % 86 - 86 85 87  FEV1-Pre L 1.22 1.24 1.26 1.33 1.33  FEV1-Predicted Pre % 64 64 65 68 68  FEV1-Post L 1.26 - 1.29 1.35 1.35  DLCO UNC% % 58 56 63 63 66  DLCO COR %Predicted % 105 109 110 108 104  TLC L - - - 3.01 3.05  TLC % Predicted % - - - 63 64  RV % Predicted % - - - 61 60    Imaging: No results found.    Specialty Problems      Pulmonary Problems   IPF (idiopathic pulmonary fibrosis) (HCC)    CT chest 01/24/12 >areas of ground-glass attenuation,  architectural distortion and some very  mild cylindrical traction  bronchiectasis, as well as marked thickening of the  peribronchovascular interstitium.       Cough   Allergic rhinitis   Mild intermittent asthma   Cough variant asthma    06/13/2016  After extensive coaching HFA effectiveness =    75% from a basline of < 25%       Chronic respiratory failure with hypoxia (HCC)      Allergies  Allergen Reactions  . Dilaudid [Hydromorphone] Nausea And Vomiting  . Crestor [  Rosuvastatin Calcium] Other (See Comments)    Myalgias, muscle weakness    Immunization History  Administered Date(s) Administered  . Influenza Split 02/22/2012, 03/01/2013, 02/08/2014, 01/27/2015  . Influenza Whole 04/11/2011, 02/10/2017  . Influenza, High Dose Seasonal PF 02/16/2017  . Influenza,inj,Quad PF,6+ Mos 03/11/2016  . Influenza-Unspecified 01/26/2018  . Pneumococcal Conjugate-13 03/29/2014  . Pneumococcal Polysaccharide-23 12/08/2017  . Pneumococcal-Unspecified 04/11/2011, 05/13/2016  . Td 08/30/2008  . Tdap 11/16/2017  . Zoster 08/07/2011  . Zoster Recombinat (Shingrix) 09/20/2016, 02/19/2017    Past Medical History:  Diagnosis Date  . Anginal pain ([place])   . Cancer ([place])   . CHF (congestive heart failure) ([place]) 01/22/2012  . Chronic fatigue 07/15/2014  . CVA (cerebral vascular accident) ([place]) 04/18/2018  . GERD (gastroesophageal reflux disease) 05/12/2017  . Hypertension   . Hypothyroidism   . ILD (interstitial lung disease) ([place]) 02/04/2012  . IPF (idiopathic pulmonary fibrosis) ([place]) 2013  . Mild intermittent asthma 01/29/2016  . Pelvic relaxation   . Prolapse of bladder   . Shortness of breath   . Thyroid disease     Tobacco History: Social History   Tobacco Use  Smoking Status Never Smoker  Smokeless Tobacco Never Used   Counseling given: Yes   Continue to not smoke  Outpatient Encounter Medications as of 02/23/2019  Medication Sig  . acetaminophen (TYLENOL) 500 MG tablet Take 1,000 mg by mouth every 6 (six)  hours as needed for mild pain or headache.   [name] amLODipine (NORVASC) 5 MG tablet TAKE 1 TABLET BY MOUTH EVERY DAY FOR BLOOD PRESSURE  . atorvastatin (LIPITOR) 40 MG tablet Take 1 tablet (40 mg total) by mouth at bedtime. For cholesterol.  [name] CALCIUM CARBONATE-VITAMIN D PO Take 1 tablet by mouth daily.   . clopidogrel (PLAVIX) 75 MG tablet Take 1 tablet (75 mg total) by mouth daily. For stroke prevention.  [name] CRANBERRY PO Take 1 capsule by mouth every Monday, Wednesday, and Friday.   . ESBRIET 801 MG TABS Take 1 tablet by mouth 3 (three) times daily.  . ferrous sulfate 325 (65 FE) MG tablet Take 325 mg by mouth daily.  [name] levothyroxine (SYNTHROID) 100 MCG tablet Take 1 tablet by mouth every morning on an empty stomach. No food or other medications for 30 minutes.  [name] loratadine (CLARITIN) 10 MG tablet Take 10 mg by mouth daily.   . metoprolol succinate (TOPROL-XL) 25 MG 24 hr tablet Take 1 tablet (25 mg total) by mouth daily.  . Multiple Vitamins-Minerals (CENTRUM SILVER PO) Take 1 tablet by mouth daily.  [name] omeprazole (PRILOSEC) 20 MG capsule TAKE 1 CAPSULE BY MOUTH EVERY DAY  . budesonide-formoterol (SYMBICORT) 80-4.5 MCG/ACT inhaler Inhale 2 puffs into the lungs 2 (two) times daily.   No facility-administered encounter medications on file as of 02/23/2019.      Review of Systems  Review of Systems  Constitutional: Positive for fatigue. Negative for activity change and fever.  HENT: Negative for sinus pressure, sinus pain and sore throat.   Respiratory: Positive for shortness of breath. Negative for cough, chest tightness and wheezing.   Cardiovascular: Negative for chest pain, palpitations and leg swelling.  Gastrointestinal: Negative for diarrhea, nausea and vomiting.  Musculoskeletal: Negative for arthralgias.  Neurological: Negative for dizziness.  Psychiatric/Behavioral: Negative for sleep disturbance. The patient is not nervous/anxious.      Physical Exam  BP 122/76   Pulse 91    Temp (!) 97.2 F (36.2 C) (Temporal)   SpO2 99%   Wt  Readings from Last 5 Encounters:  02/08/19 128 lb (58.1 kg)  01/20/19 125 lb 1.6 oz (56.7 kg)  01/04/19 128 lb 4 oz (58.2 kg)  12/08/18 129 lb (58.5 kg)  12/03/18 130 lb (59 kg)    Physical Exam Vitals signs and nursing note reviewed.  Constitutional:      General: She is not in acute distress.    Appearance: Normal appearance.  HENT:     Head: Normocephalic and atraumatic.     Right Ear: Tympanic membrane, ear canal and external ear normal. There is no impacted cerumen.     Left Ear: Tympanic membrane, ear canal and external ear normal. There is no impacted cerumen.     Nose: Nose normal. No congestion.     Mouth/Throat:     Mouth: Mucous membranes are moist.     Pharynx: Oropharynx is clear.  Eyes:     Pupils: Pupils are equal, round, and reactive to light.  Neck:     Musculoskeletal: Normal range of motion.  Cardiovascular:     Rate and Rhythm: Normal rate and regular rhythm.     Pulses: Normal pulses.     Heart sounds: Normal heart sounds. No murmur.  Pulmonary:     Effort: Pulmonary effort is normal. No respiratory distress.     Breath sounds: No decreased air movement. Examination of the right-middle field reveals rales. Examination of the right-lower field reveals rales. Examination of the left-lower field reveals rales. Rales present. No decreased breath sounds or wheezing.     Comments: Basilar crackles R > L  Scattered squeaks  Abdominal:     General: Abdomen is flat. Bowel sounds are normal.     Palpations: Abdomen is soft.  Musculoskeletal:     Right lower leg: No edema.     Left lower leg: No edema.  Skin:    General: Skin is warm and dry.     Capillary Refill: Capillary refill takes less than 2 seconds.  Neurological:     General: No focal deficit present.     Mental Status: She is alert and oriented to person, place, and time. Mental status is at baseline.     Gait: Gait normal.  Psychiatric:         Attention and Perception: Attention normal.        Mood and Affect: Mood is anxious. Affect is tearful.        Behavior: Behavior normal.        Thought Content: Thought content normal.        Judgment: Judgment normal.     Comments: Tearful affect.  Patient has lost 2 siblings to pulmonary fibrosis.  Is a very emotional experience to have to be placed on oxygen for the patient.      Lab Results:  CBC    Component Value Date/Time   WBC 8.6 01/20/2019 1031   RBC 3.78 (L) 01/20/2019 1031   RBC 3.78 (L) 01/20/2019 1031   HGB 8.6 (L) 01/20/2019 1031   HGB 12.4 12/18/2013 1504   HCT 28.1 (L) 01/20/2019 1031   HCT 38.8 12/18/2013 1504   PLT 303 01/20/2019 1031   PLT 173 12/18/2013 1504   MCV 74.3 (L) 01/20/2019 1031   MCV 83 12/18/2013 1504   MCH 22.8 (L) 01/20/2019 1031   MCHC 30.6 01/20/2019 1031   RDW 16.0 (H) 01/20/2019 1031   RDW 14.4 12/18/2013 1504   LYMPHSABS 1.1 01/20/2019 1031   LYMPHSABS 0.6 (L) 12/18/2013 1504  MONOABS 0.8 01/20/2019 1031   MONOABS 0.7 12/18/2013 1504   EOSABS 0.2 01/20/2019 1031   EOSABS 0.1 12/18/2013 1504   BASOSABS 0.1 01/20/2019 1031   BASOSABS 0.0 12/18/2013 1504    BMET    Component Value Date/Time   NA 138 12/31/2018 1412   NA 138 12/18/2013 1504   K 4.6 12/31/2018 1412   K 3.4 (L) 12/18/2013 1504   CL 104 12/31/2018 1412   CL 103 12/18/2013 1504   CO2 28 12/31/2018 1412   CO2 24 12/18/2013 1504   GLUCOSE 99 12/31/2018 1412   GLUCOSE 125 (H) 12/18/2013 1504   BUN 18 12/31/2018 1412   BUN 17 12/18/2013 1504   CREATININE 0.92 12/31/2018 1412   CREATININE 0.88 12/18/2013 1504   CALCIUM 9.5 12/31/2018 1412   CALCIUM 8.5 12/18/2013 1504   GFRNONAA >60 07/09/2018 1107   GFRNONAA >60 12/18/2013 1504   GFRAA >60 07/09/2018 1107   GFRAA >60 12/18/2013 1504    BNP    Component Value Date/Time   BNP 100.9 (H) 04/18/2018 0931    ProBNP No results found for: PROBNP    Assessment & Plan:   Chronic diastolic congestive  heart failure ([place]) Plan: May need to consider repeat echocardiogram at some point in the future If shortness of breath persists to later this week despite oxygen therapy need to consider proBNP in office  Pulmonary HTN ([place]) Plan: We will discuss this further with Dr. Lamonte Sakai when patient sees Dr. Lamonte Sakai next week May need to consider repeat echocardiogram at some point As patient does not appear fluid overloaded and weights are stable we will hold off on lab work at this time  IPF (idiopathic pulmonary fibrosis) ([place]) Plan: Continue Esbriet Complete pulmonary function testing as ordered as well as follow-up with Dr. Lamonte Sakai We will coordinate an ILD consult with Dr. [name] later on this week this needs to be a 30-minute visit May need to consider transition from [place] to potentially OFEV Start oxygen today: 3 L of O2 with physical exertion, 2 L at rest Work on increasing daily physical activity Patient will need high-dose flu vaccine in the fall  Allergic rhinitis Plan: Continue Claritin  Mild intermittent asthma Slight mid flow reversibility on 2017 pulmonary function test Patient does have allergic rhinitis  Plan: Empiric trial of Symbicort 80 Patient needs to stop Symbicort 80 48 hours prior to pulmonary function testing Continue forward with pulmonary function test with Dr. Lamonte Sakai next week  Chronic respiratory failure with hypoxia ([place]) Qualified for oxygen with physical exertion on walk today  Plan: 3 L of O2 with physical exertion 2 L of O2 with rest We will schedule ILD consult with Dr. [name] later on this week Keep scheduled follow-up with pulmonary function test with Dr. Lamonte Sakai next week  Anemia Plan: Lab work today    Return in about 1 week (around 03/02/2019), or if symptoms worsen or fail to improve, for Follow up with Dr. Purnell Shoemaker, Follow up with Dr. Lamonte Sakai.   Lauraine Rinne, NP 02/23/2019   This appointment was 46 minutes long with over 50% of  the time in direct face-to-face patient care, assessment, plan of care, and follow-up.  This includes discussion with Dr. Lamonte Sakai regarding case.  I have also notified Dr. [name] the patient will be consulted with him.

## 2019-02-23 NOTE — Assessment & Plan Note (Addendum)
Plan: May need to consider repeat echocardiogram at some point in the future If shortness of breath persists to later this week despite oxygen therapy need to consider proBNP in office

## 2019-02-23 NOTE — Assessment & Plan Note (Signed)
Plan: Lab work today

## 2019-02-23 NOTE — Assessment & Plan Note (Signed)
Plan: Continue Esbriet Complete pulmonary function testing as ordered as well as follow-up with Dr. Lamonte Sakai We will coordinate an ILD consult with Dr. [name] later on this week this needs to be a 30-minute visit May need to consider transition from [place] to potentially OFEV Start oxygen today: 3 L of O2 with physical exertion, 2 L at rest Work on increasing daily physical activity Patient will need high-dose flu vaccine in the fall

## 2019-02-23 NOTE — Assessment & Plan Note (Signed)
Plan: We will discuss this further with Dr. Lamonte Sakai when patient sees Dr. Lamonte Sakai next week May need to consider repeat echocardiogram at some point As patient does not appear fluid overloaded and weights are stable we will hold off on lab work at this time

## 2019-02-23 NOTE — Assessment & Plan Note (Signed)
Plan: Continue Claritin

## 2019-02-23 NOTE — Assessment & Plan Note (Signed)
Slight mid flow reversibility on 2017 pulmonary function test Patient does have allergic rhinitis  Plan: Empiric trial of Symbicort 80 Patient needs to stop Symbicort 80 48 hours prior to pulmonary function testing Continue forward with pulmonary function test with Dr. Lamonte Sakai next week

## 2019-02-23 NOTE — Assessment & Plan Note (Signed)
Qualified for oxygen with physical exertion on walk today  Plan: 3 L of O2 with physical exertion 2 L of O2 with rest We will schedule ILD consult with Dr. [name] later on this week Keep scheduled follow-up with pulmonary function test with Dr. Lamonte Sakai next week

## 2019-02-25 ENCOUNTER — Ambulatory Visit: Payer: Medicare Other | Admitting: Internal Medicine

## 2019-02-25 ENCOUNTER — Encounter: Payer: Self-pay | Admitting: Internal Medicine

## 2019-02-25 ENCOUNTER — Other Ambulatory Visit: Payer: Self-pay

## 2019-02-25 VITALS — BP 134/76 | HR 97 | Temp 97.7°F | Ht 62.5 in | Wt 127.2 lb

## 2019-02-25 DIAGNOSIS — Z5181 Encounter for therapeutic drug level monitoring: Secondary | ICD-10-CM

## 2019-02-25 DIAGNOSIS — J84112 Idiopathic pulmonary fibrosis: Secondary | ICD-10-CM | POA: Diagnosis not present

## 2019-02-25 NOTE — Progress Notes (Signed)
78 yo female never smoker seen for initial pulmonary consult during hospital for acute respiratory failure   [facility] follow up 01/30/12 -  Pt was admitted 2 weeks ago for chest pain and dyspnea.  Found to have Acute respiratory failure with hypoxia--  Tx for possible diastolic CHF with aggressive diuresis Echo showed EF 60% , no mention of diastolic dysfxn.  HRCT consistent with ILD-  CCM consult with autoimmune workup that was unrevealing with neg ANA, ANCA, RA factor , nml ESR , CRP    Does light housework but not able walk long distances due to dyspnea  FH of pulmonary fibrosis -sibling -they were smokers. NO significant desats with walking in cardiology yesterday, desated to 93% on room air.  No cough or wheezing  No edema.   Since discharge she is feeling better with less DOE. But does get winded with walking long distances.   ROV 02/21/12 -- follows up for ILD noted during hospitalization for resp failure. Treated for possible diastolic CHF. CT scan shows some mild basilar fibrotic change. PFT's 9/3 >> probable mixed disease, no BD response, restricted volumes, decrease DLCO that corrects for Va.  She had family hx of ILD.  She had walking oximetry that she says was reassuring. She feels no better today than at hospital discharge.   ROV 03/24/12 -- ILD without clear etiology, mixed dz on PFT 02/11/12. Initiated a trial prednisone last month >> She doesn't believe that her dyspnea has changed.  Last time she did not desat.   ROV 06/23/12 -- ILD without clear etiology, mixed dz on PFT 02/11/12. Trial of pred >> weaned to off. Sent her to pulm rehab at [place], feels that it has benefited her. She isn't as SOB as before. CXR today >> stable bibasilar ILD, hiatal hernia.   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 Also possible component diastolic dysfxn. She has a family hx of ILD. She  tells me that her dyspnea is worse compared with last year. She is having some spells of near syncope on exertion. Walked today to exhaustion without desaturation.   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 She has been part of the pirfenidone trial and has been on the medication. She will be transitioning to the commercial drug. Denies any side effects from the medication. I completed financial assistance forms on 04/12/13.  Due for CXR today. Not really doing exercise - she did do pulmonary rehab at [facility].    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 Last CXR 05/19/13, last CT scan 01/24/12. She is still on the pirfenidone trial, receiving study drug. She is fairly stable. Will be converted to the commercial med soon. Tolerates soon.   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 Returns for f/u. She is now on pirfenidone 3 pills tid and is tolerating. She is having more problems with fatigue and dyspnea with exertion - has lay down after eating dinner, with chores. Her synthroid hasn't been adjusted in a long time.  Her last labs were March 3, 15.   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 She is on pirfenidone 3 pills tid. She has been tolerating well from GI standpoint. TSH normal. She has some fatigue, otherwise doing well.  She needs LFT done in 3 months and after that we can go to every 6 months,.   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 On pirfenidone 3 pills tid since August 2014. Last CT scan 5/'15 was stable, described as an NSIP pattern.  LFT were done today, not yet available. She has noticed some slightly raised pale pink macules, not pruritic, since last month. She has  been careful to wear sunscreen.    ROV 12/01/17 --this follow-up visit for patient with a history of idiopathic interstitial lung disease, some coexisting obstruction based on her spirometry.  She also has a hiatal hernia with GERD and chronic cough.  We have managed her on pirfenidone.  At our last visit she noted that she is had some decreased functional capacity, more dyspnea with exertion. She is still having decreased energy, more fatigue with activity. We checked CBC and TSH, both reassuring. She keeps a slight cough, added chlorpheniramine to loratadine. Remains on omeprazole.    She underwent a 6-minute walk test today and was able to walk 462 m without desaturation.  Her last in 04/2016 was 443mRepeat CT chest from 11/28/2017 reviewed and shows stable interstitial changes compared with her most recent from 2017.  There is patchy groundglass attenuation with associated septal thickening and peripheral bronchial ectasis without overt honeycomb change.  No effusion, no suspicious nodules.   ROV 06/18/18 --Ms. Swatek is 89 followed for history of idiopathic interstitial lung disease and some coexisting obstruction noted on spirometry.  She also has a hiatal hernia with GERD and some associated chronic cough.  We have been managing her on pirfenidone - no side effects.  She is on omeprazole, loratadine, chlorpheniramine.  Since our last visit she was hospitalized for a stroke, has some residual word-finding trouble. She is scheduled for nerve conduction studies after some falls and leg weakness. She is also being evaluated for possible thyroid surgery with Dr. GHarlow Asa She had a CT chest done 01/12/18 that I reviewed, shows no real change in NSIP pattern, large HH, no new infiltrates. Overall reports that breathing is stable, not limiting.   ROV 12/03/2018 --pleasant 78year old woman with mixed lung disease.  She has mild obstruction and restriction in the setting of a hiatal hernia and idiopathic  interstitial lung disease that would characterizes IPF.  We have been managing her on pirfenidone.  Also with a history of GERD on Prilosec, allergic rhinitis on loratadine. Last LFT in 05/2018 were normal. Last Ct chest was 01/2018. She is having progressive exertional SOB compared with last time. She has to rest doing housework, taking out the trash. She has to stop to rest. Not on BD's, didn't benefit. She had thyroid sgy in 07/2018, just had her synthroid increased in 07/2018. TSH was 3.05 5/26.     OV 02/25/2019  Subjective:  Patient ID: NRutherford Limerick female , DOB: [birth year], age 78y.o. , MRN: [id number], ADDR[place]: [id number] [id number]  02/25/2019 -   Chief Complaint  Patient presents with   IPF (idiopathic pulmonary fibrosis)    Had 6 minute walk yesterday.     Clinical IPF patient  - On Esbreit since aug 2014 initially via  expanded access program   - Autoimmune panel 2013 and 2014 is negative and normal   - Pulmonary function test 02/15/2014 postbronchodilator FVC 1.55 L/  59%, total lung capacity 3.05 L/64%, DLCO 14.3/66%  - CT chest  May 2015 - NSIP per radioolgy  - Trial participagnt early 08-17-14 for 6 months IPF - Phase 4 study - got esbriet and 6 months of add-on ofev   HPI Skylinn Vialpando 78 y.o. -has now been referred to the ILD clinic.  This is because she has had progression in her symptoms.  She says for the last few months she has had progressive shortness of breath but no worsening cough or pedal edema or hemoptysis or proximal nocturnal dyspnea.  She is tolerating the Esbriet well since August 2014 when she was in the expanded access program.  Overall she is stable and she is active in the pulmonary fibrosis foundation support group but in the last few months is progressive dyspnea.  Nurse practitioner did a high-resolution CT scan of the chest and it shows progression although the pattern is still indeterminate for UIP.  In addition  6-minute walk test last week she had new onset desaturation and has been started on portable oxygen which is helping.  However she does not have nighttime oxygen.  There is a spirometry that is upcoming.  Review of the spirometry is between 2013/08/17 and Aug 18, 2015 show clear progression in both FVC and DLCO.  Previously serology was negative in 20 13/2014 but she says she might have Raynaud Echocardiogram November 2019 with normal ejection fraction and grade 1 diastolic dysfunction  [place] Integrated Comprehensive ILD Questionnaire  Symptoms: -ILD diagnosed in 20 14/2015.  Which is gradually worse in the last several months.  She is having level for shortness of breath the household work shopping and walking on level ground at her own pace.  She does not think any other condition is limiting her.  No cough.  No wheezing   Past Medical History : Negative for collagen vascular disease acid reflux but positive for previous stroke in November 2019 and thyroid disease.  Detailed past medical history is negative otherwise   ROS: Positive for fatigue   FAMILY HISTORY of LUNG DISEASE: Her sister died in 31 from pulmonary fibrosis and a brother died in 2004/08/17 from pulmonary fibrosis   EXPOSURE HISTORY: No smoking no cigars.  No vaping no marijuana.  No cocaine.  No intravenous drug use.   HOME and HOBBY DETAILS : Lives in an apartment.  No mold exposure no mildew exposure.  Extensive home exposure history is negative.   OCCUPATIONAL HISTORY (122 questions) : Positive for gardening but otherwise negative.   PULMONARY TOXICITY HISTORY (27 items): Intermittent prednisone burst otherwise negative.     Results for KORYN, CHARLOT (MRN [id number]) as of 02/25/2019 11:43  Ref. Range 02/15/2014 11:51 07/15/2014 11:47 01/03/2015 09:40 03/17/2015 13:03 10/23/2015 08:45  FVC-Pre Latest Units: L 1.58 1.59 1.52 1.47 1.44   Results for JAYDAN, CHRETIEN (MRN [id number]) as of 02/25/2019 11:43  Ref. Range 02/15/2014  11:51 07/15/2014 11:47 01/03/2015 09:40 03/17/2015 13:03 10/23/2015 08:45  DLCO unc Latest Units: ml/min/mmHg 14.30 13.65 13.66 12.11 12.65  DLCO unc % pred Latest Units: % 66 63 63 56 58    CT chest high resolution Aug 2020 -personally visualized  standard protocol without intravenous contrast. High resolution imaging of the lungs, as well as inspiratory and expiratory imaging, was performed.  COMPARISON:  11/28/2017, 10/09/2015, 01/24/2012  FINDINGS: Cardiovascular: Aortic atherosclerosis. Cardiomegaly. Left coronary artery calcifications. No pericardial effusion.  Mediastinum/Nodes: No enlarged mediastinal, hilar, or axillary lymph nodes. Large hiatal hernia with intrathoracic position of the gastric body and fundus. Thyroid gland, trachea, and esophagus demonstrate no significant  findings.  Lungs/Pleura: Redemonstrated moderate pulmonary fibrosis in a pattern with an apical to basal gradient, featuring peripheral interstitial opacity and some suggestion of nodularity, mild traction bronchiectasis, and occasional areas of subpleural bronchiolectasis in the bases. There is no evidence of honeycombing. Moderate lobular air trapping on expiratory phase imaging. Findings are not significantly changed when compared to immediate prior examination dated 11/28/2017, however are slightly worsened over time on comparison to multiple prior examinations dating back to at least 01/24/2012. No pleural effusion or pneumothorax.  Upper Abdomen: No acute abnormality.  Musculoskeletal: No chest wall mass or suspicious bone lesions identified.  IMPRESSION: 1. Redemonstrated moderate pulmonary fibrosis in a pattern with an apical to basal gradient, featuring peripheral interstitial opacity and some suggestion of nodularity, mild traction bronchiectasis, and occasional areas of subpleural bronchiolectasis in the bases. There is no evidence of honeycombing. Moderate lobular air trapping  on expiratory phase imaging. Findings are not significantly changed when compared to immediate prior examination dated 11/28/2017, however are slightly worsened over time on comparison to multiple prior examinations dating back to at least 01/24/2012. Findings remain indeterminate for UIP and differential considerations include both UIP and chronic fibrosing NSIP.  2.  Coronary artery disease and aortic atherosclerosis.  3.  Hiatal hernia.   Electronically Signed   By: Eddie Candle M.D.   On: 01/15/2019 12:09  ROS - per HPI Recent Labs  Lab 02/23/19 1153  AST 20  ALT 13  ALKPHOS 89  BILITOT 0.3  PROT 6.8  ALBUMIN 4.1       has a past medical history of Anginal pain ([place]), Cancer ([place]), CHF (congestive heart failure) ([place]) (01/22/2012), Chronic fatigue (07/15/2014), CVA (cerebral vascular accident) ([place]) (04/18/2018), GERD (gastroesophageal reflux disease) (05/12/2017), Hypertension, Hypothyroidism, ILD (interstitial lung disease) ([place]) (02/04/2012), IPF (idiopathic pulmonary fibrosis) ([place]) (2013), Mild intermittent asthma (01/29/2016), Pelvic relaxation, Prolapse of bladder, Shortness of breath, and Thyroid disease.   reports that she has never smoked. She has never used smokeless tobacco.  Past Surgical History:  Procedure Laterality Date   ANTERIOR AND POSTERIOR REPAIR  12/05/2011   Procedure: ANTERIOR (CYSTOCELE) AND POSTERIOR REPAIR (RECTOCELE);  Surgeon: [name], MD;  Location: [facility];  Service: Gynecology;  Laterality: N/A;  with cysto   Bladder tact  15 years ago   LEFT HEART CATHETERIZATION WITH CORONARY ANGIOGRAM N/A 01/22/2012   Procedure: LEFT HEART CATHETERIZATION WITH CORONARY ANGIOGRAM;  Surgeon: [name], MD;  Location: [facility] CATH [facility];  Service: Cardiovascular;  Laterality: N/A;   NECK SURGERY  2010   SHOULDER SURGERY     thyroid disease     THYROID LOBECTOMY Left 07/17/2018   Procedure: LEFT THYROID LOBECTOMY;  Surgeon: [name], MD;   Location: [facility];  Service: General;  Laterality: Left;   TONSILLECTOMY     VAGINAL HYSTERECTOMY  15 years ago    Allergies  Allergen Reactions   Dilaudid [Hydromorphone] Nausea And Vomiting   Crestor [Rosuvastatin Calcium] Other (See Comments)    Myalgias, muscle weakness    Immunization History  Administered Date(s) Administered   Influenza Split 02/22/2012, 03/01/2013, 02/08/2014, 01/27/2015   Influenza Whole 04/11/2011, 02/10/2017   Influenza, High Dose Seasonal PF 02/16/2017, 01/20/2019   Influenza,inj,Quad PF,6+ Mos 03/11/2016   Influenza-Unspecified 01/26/2018   Pneumococcal Conjugate-13 03/29/2014   Pneumococcal Polysaccharide-23 12/08/2017   Pneumococcal-Unspecified 04/11/2011, 05/13/2016   Td 08/30/2008   Tdap 11/16/2017   Zoster 08/07/2011   Zoster Recombinat (Shingrix) 09/20/2016, 02/19/2017    Family History  Problem Relation Age  of Onset   Thyroid disease Mother    Hypertension Mother    Coronary artery disease Mother    Dementia Mother    Stroke Mother    Aneurysm Father    Pulmonary fibrosis Sister    Stroke Other    Sleep apnea Other    Cancer Brother        LIVER   Pulmonary fibrosis Brother      Current Outpatient Medications:    acetaminophen (TYLENOL) 500 MG tablet, Take 1,000 mg by mouth every 6 (six) hours as needed for mild pain or headache. , Disp: , Rfl:    amLODipine (NORVASC) 5 MG tablet, TAKE 1 TABLET BY MOUTH EVERY DAY FOR BLOOD PRESSURE, Disp: 90 tablet, Rfl: 1   atorvastatin (LIPITOR) 40 MG tablet, Take 1 tablet (40 mg total) by mouth at bedtime. For cholesterol., Disp: 90 tablet, Rfl: 3   budesonide-formoterol (SYMBICORT) 80-4.5 MCG/ACT inhaler, Inhale 2 puffs into the lungs 2 (two) times daily., Disp: 1 Inhaler, Rfl: 0   CALCIUM CARBONATE-VITAMIN D PO, Take 1 tablet by mouth daily. , Disp: , Rfl:    clopidogrel (PLAVIX) 75 MG tablet, Take 1 tablet (75 mg total) by mouth daily. For stroke prevention.,  Disp: 90 tablet, Rfl: 3   CRANBERRY PO, Take 1 capsule by mouth every Monday, Wednesday, and Friday. , Disp: , Rfl:    ESBRIET 801 MG TABS, Take 1 tablet by mouth 3 (three) times daily., Disp: , Rfl:    ferrous sulfate 325 (65 FE) MG tablet, Take 325 mg by mouth daily., Disp: , Rfl:    levothyroxine (SYNTHROID) 100 MCG tablet, Take 1 tablet by mouth every morning on an empty stomach. No food or other medications for 30 minutes., Disp: 90 tablet, Rfl: 1   loratadine (CLARITIN) 10 MG tablet, Take 10 mg by mouth daily. , Disp: , Rfl:    metoprolol succinate (TOPROL-XL) 25 MG 24 hr tablet, Take 1 tablet (25 mg total) by mouth daily., Disp: 30 tablet, Rfl: 11   Multiple Vitamins-Minerals (CENTRUM SILVER PO), Take 1 tablet by mouth daily., Disp: , Rfl:    omeprazole (PRILOSEC) 20 MG capsule, TAKE 1 CAPSULE BY MOUTH EVERY DAY, Disp: 90 capsule, Rfl: 1      Objective:   Vitals:   02/25/19 1137  BP: 134/76  Pulse: 97  Temp: 97.7 F (36.5 C)  SpO2: 97%  Weight: 127 lb 3.2 oz (57.7 kg)  Height: 5' 2.5" (1.588 m)    Estimated body mass index is 22.89 kg/m as calculated from the following:   Height as of this encounter: 5' 2.5" (1.588 m).   Weight as of this encounter: 127 lb 3.2 oz (57.7 kg).  _0 @  Filed Weights   02/25/19 1137  Weight: 127 lb 3.2 oz (57.7 kg)     Physical Exam  General Appearance:    Alert, cooperative, no distress, appears stated age - yes , Deconditioned looking - no , OBESE  - no, Sitting on Wheelchair -  no  Head:    Normocephalic, without obvious abnormality, atraumatic  Eyes:    PERRL, conjunctiva/corneas clear,  Ears:    Normal TM's and external ear canals, both ears  Nose:   Nares normal, septum midline, mucosa normal, no drainage    or sinus tenderness. OXYGEN ON  - no . Patient is @ ra   Throat:   Lips, mucosa, and tongue normal; teeth and gums normal. Cyanosis on lips - no  Neck:   Supple,  symmetrical, trachea midline, no adenopathy;      thyroid:  no enlargement/tenderness/nodules; no carotid   bruit or JVD  Back:     Symmetric, no curvature, ROM normal, no CVA tenderness  Lungs:     Distress - no , Wheeze no, Barrell Chest - no, Purse lip breathing - no, Crackles -classic bilateral bibasal Velcro crackles of UIP  Chest Wall:    No tenderness or deformity.    Heart:    Regular rate and rhythm, S1 and S2 normal, no rub   or gallop, Murmur - no  Breast Exam:    NOT DONE  Abdomen:     Soft, non-tender, bowel sounds active all four quadrants,    no masses, no organomegaly. Visceral obesity - no  Genitalia:   NOT DONE  Rectal:   NOT DONE  Extremities:   Extremities - normal, Has Cane - no, Clubbing - no, Edema - no  Pulses:   2+ and symmetric all extremities  Skin:   Stigmata of Connective Tissue Disease - no  Lymph nodes:   Cervical, supraclavicular, and axillary nodes normal  Psychiatric:  Neurologic:   Pleasant - yes, Anxious - maybe, Flat affect - no  CAm-ICU - neg, Alert and Oriented x 3 - yes, Moves all 4s - yes, Speech - normal, Cognition - intact           Assessment:       ICD-10-CM   1. IPF (idiopathic pulmonary fibrosis) ([place])  J84.112   2. Therapeutic drug monitoring  Z51.81    She has idiopathic ILD.  Even though the CT is indeterminate for UIP based on her age, negative history, negative serology and a progression in the family history is overwhelming odds this is IPF.  Even if it is not IPF it is a progressive phenotype for which anti-fibrotic is indicated anyways.  I think the Esbriet has helped her all along.  Without it her rate of progression could have been worse.  Nevertheless she is got progression and now she is got exertional desaturations.  We discussed nintedanib.  We discussed the fact that she also took it for 6 months in 2016.  She has forgotten this.  She is willing to make a switch and give it a try.  Add-on therapy is not approved by insurance.  She had liver function test recently that  is normal.  Advised that he use exertional oxygen but also will test for overnight desaturations in order to help her comfort.  Given the fact she think she might have Raynaud we will check serology    Plan:     Patient Instructions     ICD-10-CM   1. IPF (idiopathic pulmonary fibrosis) ([place])  J84.112   2. Therapeutic drug monitoring  Z51.81     IPF is progressive since diagnosis -in 2014/2015 - slow and steadily worse Now since sept 2020 needing oxygen with exertion Esbriet has worked but disease is breakingthrough  Despite it but I Think is still working some LFT test normal 02/23/2019  Plan - test autoimmune serology again (last 2013/2014 - ANA, DS DNA, RF, CCP, SCL-70, ssa, ssb)  - test ono on room air - keep pft test appointment for next week  - use o2 with exertion as advised  - continue esbriet for now - but change to ofev  124m twice daily  - start paper work 02/25/2019  - when drug arrives stop esbriet one week earlier and start ofev  Followup  -  6 weeks with DR [name] to report progress; 30 min slot       > 50% of this > 40 min visit spent in face to face counseling or/and coordination of care - by this undersigned MD - Dr Brand Males. This includes one or more of the following documented above: discussion of test results, diagnostic or treatment recommendations, prognosis, risks and benefits of management options, instructions, education, compliance or risk-factor reduction     SIGNATURE    Dr. Brand Males, M.D., F.C.C.P,  Pulmonary and Critical Care Medicine Staff Physician, [place] Director - Interstitial Lung Disease  Program  Pulmonary [place] [facility] [place], [place], [address]  Pager: [phone], If no answer or between  15:00h - 7:00h: call [phone] Telephone: [phone]  12:16 PM 02/25/2019

## 2019-02-25 NOTE — Patient Instructions (Addendum)
ICD-10-CM   1. IPF (idiopathic pulmonary fibrosis) ([place])  J84.112   2. Therapeutic drug monitoring  Z51.81     IPF is progressive since diagnosis -in 2014/2015 - slow and steadily worse Now since sept 2020 needing oxygen with exertion Esbriet has worked but disease is breakingthrough  Despite it but I Think is still working some LFT test normal 02/23/2019  Plan - test autoimmune serology again (last 2013/2014 - ANA, DS DNA, RF, CCP, SCL-70, ssa, ssb)  - test ono on room air - keep pft test appointment for next week  - use o2 with exertion as advised  - continue esbriet for now - but change to ofev  187m twice daily  - start paper work 02/25/2019  - when drug arrives stop esbriet one week earlier and start ofev  Followup  - 6 weeks with DR RChase Callerto report progress; 30 min slot

## 2019-02-26 ENCOUNTER — Other Ambulatory Visit: Payer: Self-pay

## 2019-02-26 ENCOUNTER — Encounter: Payer: Self-pay | Admitting: Oncology

## 2019-02-26 LAB — RHEUMATOID FACTOR: Rheumatoid fact SerPl-aCnc: <14 IU/mL (ref <14)

## 2019-02-26 LAB — CYCLIC CITRUL PEPTIDE ANTIBODY, IGG: Cyclic Citrullin Peptide Ab: <16 UNITS

## 2019-02-27 LAB — ANA+ENA+DNA/DS+SCL 70+SJOSSA/B
ANA Titer 1: POSITIVE — AB
ENA RNP Ab: 0.6 AI (ref 0.0–0.9)
ENA SM Ab Ser-aCnc: <0.2 AI (ref 0.0–0.9)
ENA SSA (RO) Ab: <0.2 AI (ref 0.0–0.9)
ENA SSB (LA) Ab: 2.3 AI — ABNORMAL HIGH (ref 0.0–0.9)
Scleroderma (Scl-70) (ENA) Antibody, IgG: <0.2 AI (ref 0.0–0.9)
dsDNA Ab: 1 IU/mL (ref 0–9)

## 2019-02-27 LAB — FANA STAINING PATTERNS
Homogeneous Pattern: 1:320 {titer} — ABNORMAL HIGH
Speckled Pattern: 1:320 {titer} — ABNORMAL HIGH

## 2019-03-01 ENCOUNTER — Other Ambulatory Visit: Payer: Self-pay

## 2019-03-01 ENCOUNTER — Other Ambulatory Visit
Admission: RE | Admit: 2019-03-01 | Discharge: 2019-03-01 | Disposition: A | Discharge status: Home / Self Care | Payer: Medicare Other | Source: Ambulatory Visit | Attending: Emergency Medicine | Admitting: Emergency Medicine

## 2019-03-01 ENCOUNTER — Inpatient Hospital Stay: Payer: Medicare Other | Attending: Oncology

## 2019-03-01 DIAGNOSIS — Z833 Family history of diabetes mellitus: Secondary | ICD-10-CM | POA: Diagnosis not present

## 2019-03-01 DIAGNOSIS — J449 Chronic obstructive pulmonary disease, unspecified: Secondary | ICD-10-CM | POA: Diagnosis not present

## 2019-03-01 DIAGNOSIS — E039 Hypothyroidism, unspecified: Secondary | ICD-10-CM | POA: Diagnosis not present

## 2019-03-01 DIAGNOSIS — R918 Other nonspecific abnormal finding of lung field: Secondary | ICD-10-CM | POA: Insufficient documentation

## 2019-03-01 DIAGNOSIS — Z800 Family history of malignant neoplasm of digestive organs: Secondary | ICD-10-CM | POA: Diagnosis not present

## 2019-03-01 DIAGNOSIS — R634 Abnormal weight loss: Secondary | ICD-10-CM | POA: Insufficient documentation

## 2019-03-01 DIAGNOSIS — R5383 Other fatigue: Secondary | ICD-10-CM | POA: Insufficient documentation

## 2019-03-01 DIAGNOSIS — J4520 Mild intermittent asthma, uncomplicated: Secondary | ICD-10-CM | POA: Insufficient documentation

## 2019-03-01 DIAGNOSIS — Z79899 Other long term (current) drug therapy: Secondary | ICD-10-CM | POA: Diagnosis not present

## 2019-03-01 DIAGNOSIS — Z885 Allergy status to narcotic agent status: Secondary | ICD-10-CM | POA: Insufficient documentation

## 2019-03-01 DIAGNOSIS — J84112 Idiopathic pulmonary fibrosis: Secondary | ICD-10-CM | POA: Diagnosis not present

## 2019-03-01 DIAGNOSIS — R05 Cough: Secondary | ICD-10-CM | POA: Insufficient documentation

## 2019-03-01 DIAGNOSIS — I509 Heart failure, unspecified: Secondary | ICD-10-CM | POA: Diagnosis not present

## 2019-03-01 DIAGNOSIS — D509 Iron deficiency anemia, unspecified: Secondary | ICD-10-CM | POA: Diagnosis present

## 2019-03-01 DIAGNOSIS — Z8249 Family history of ischemic heart disease and other diseases of the circulatory system: Secondary | ICD-10-CM | POA: Diagnosis not present

## 2019-03-01 DIAGNOSIS — K219 Gastro-esophageal reflux disease without esophagitis: Secondary | ICD-10-CM | POA: Insufficient documentation

## 2019-03-01 DIAGNOSIS — Z01812 Encounter for preprocedural laboratory examination: Secondary | ICD-10-CM | POA: Insufficient documentation

## 2019-03-01 DIAGNOSIS — Z820 Family history of epilepsy and other diseases of the nervous system: Secondary | ICD-10-CM | POA: Diagnosis not present

## 2019-03-01 DIAGNOSIS — Z8673 Personal history of transient ischemic attack (TIA), and cerebral infarction without residual deficits: Secondary | ICD-10-CM | POA: Diagnosis not present

## 2019-03-01 DIAGNOSIS — K449 Diaphragmatic hernia without obstruction or gangrene: Secondary | ICD-10-CM | POA: Insufficient documentation

## 2019-03-01 DIAGNOSIS — I700 Atherosclerosis of aorta: Secondary | ICD-10-CM | POA: Diagnosis not present

## 2019-03-01 DIAGNOSIS — Z20828 Contact with and (suspected) exposure to other viral communicable diseases: Secondary | ICD-10-CM | POA: Diagnosis not present

## 2019-03-01 DIAGNOSIS — R0602 Shortness of breath: Secondary | ICD-10-CM | POA: Diagnosis not present

## 2019-03-01 DIAGNOSIS — Z8349 Family history of other endocrine, nutritional and metabolic diseases: Secondary | ICD-10-CM | POA: Insufficient documentation

## 2019-03-01 DIAGNOSIS — I110 Hypertensive heart disease with heart failure: Secondary | ICD-10-CM | POA: Diagnosis not present

## 2019-03-01 LAB — IRON AND TIBC
Iron: 43 ug/dL (ref 28–170)
Saturation Ratios: 13 % (ref 10.4–31.8)
TIBC: 328 ug/dL (ref 250–450)
UIBC: 285 ug/dL

## 2019-03-01 LAB — CBC WITH DIFFERENTIAL/PLATELET
Abs Immature Granulocytes: 0.01 10*3/uL (ref 0.00–0.07)
Basophils Absolute: 0 10*3/uL (ref 0.0–0.1)
Basophils Relative: 1 %
Eosinophils Absolute: 0.2 10*3/uL (ref 0.0–0.5)
Eosinophils Relative: 3 %
HCT: 38.5 % (ref 36.0–46.0)
Hemoglobin: 12 g/dL (ref 12.0–15.0)
Immature Granulocytes: 0 %
Lymphocytes Relative: 16 %
Lymphs Abs: 0.9 10*3/uL (ref 0.7–4.0)
MCH: 26 pg (ref 26.0–34.0)
MCHC: 31.2 g/dL (ref 30.0–36.0)
MCV: 83.3 fL (ref 80.0–100.0)
Monocytes Absolute: 0.6 10*3/uL (ref 0.1–1.0)
Monocytes Relative: 11 %
Neutro Abs: 3.9 10*3/uL (ref 1.7–7.7)
Neutrophils Relative %: 69 %
Platelets: 192 10*3/uL (ref 150–400)
RBC: 4.62 MIL/uL (ref 3.87–5.11)
RDW: 21.8 % — ABNORMAL HIGH (ref 11.5–15.5)
WBC: 5.7 10*3/uL (ref 4.0–10.5)
nRBC: 0 % (ref 0.0–0.2)

## 2019-03-01 LAB — FERRITIN: Ferritin: 28 ng/mL (ref 11–307)

## 2019-03-01 LAB — SARS CORONAVIRUS 2 (TAT 6-24 HRS): SARS Coronavirus 2: NEGATIVE

## 2019-03-02 ENCOUNTER — Encounter: Payer: Self-pay | Admitting: Oncology

## 2019-03-02 ENCOUNTER — Other Ambulatory Visit: Payer: Self-pay

## 2019-03-02 NOTE — Progress Notes (Signed)
Patient pre screened for office appointment, no questions or concerns today.

## 2019-03-03 ENCOUNTER — Inpatient Hospital Stay (HOSPITAL_BASED_OUTPATIENT_CLINIC_OR_DEPARTMENT_OTHER): Payer: Medicare Other | Admitting: Oncology

## 2019-03-03 ENCOUNTER — Other Ambulatory Visit: Payer: Self-pay

## 2019-03-03 VITALS — BP 139/84 | HR 89 | Temp 97.0°F | Resp 16 | Wt 122.0 lb

## 2019-03-03 DIAGNOSIS — D509 Iron deficiency anemia, unspecified: Secondary | ICD-10-CM | POA: Diagnosis not present

## 2019-03-03 DIAGNOSIS — R5383 Other fatigue: Secondary | ICD-10-CM | POA: Diagnosis not present

## 2019-03-03 DIAGNOSIS — R634 Abnormal weight loss: Secondary | ICD-10-CM

## 2019-03-03 NOTE — Progress Notes (Signed)
Hematology/Oncology follow up note [facility] Telephone:([phone] [phone]) [phone]   Patient Care Team: [name], NP as PCP - General (Internal Medicine) Buford Dresser, MD as PCP - Cardiology (Cardiology)  REFERRING PROVIDER: [name], NP  CHIEF COMPLAINTS/REASON FOR VISIT:  Follow-up for iron deficiency anemia  HISTORY OF PRESENTING ILLNESS:  Tina Calderon is a  78 y.o.  female with PMH listed below who was seen at the request of [name], NP For evaluation of anemia. Reviewed patient's recent labs that was done.  Labs revealed anemia with hemoglobin of 8.9, MCV 74.4 on 01/04/2019. Reviewed patient's previous labs ordered by other physicians, anemia is chronic onset , duration is since 2011.  Hemoglobin was at baseline between 10-11.  Hemoglobin started to drop since June 2020.  MCV also decreased to be below 80 since June 2020. No aggravating or improving factors.  Associated signs and symptoms: Patient reports fatigue.  SOB with exertion.  Denies weight loss, easy bruising, hematochezia, hemoptysis, hematuria. Context: History of GI bleeding: Denies               History of Chronic kidney disease denies                              Last colonoscopy: Reported last colonoscopy in 2006.                Patient has history of interstitial lung disease, coexisting COPD, hiatal hernia. Reports feeling very fatigued and tired.  Exacerbated with exertion.  Chronic shortness of breath appetite is not good.  Lost 4 pounds since June 2020.  Not eating much.    INTERVAL HISTORY Tina Calderon is a 78 y.o. female who has above history reviewed by me today presents for follow up visit for management of iron deficiency anemia Problems and complaints are listed below: Patient declines IV iron infusions.  She is taking oral iron supplementation and denies any GI side effects.  Tolerating well. She reports that she  has more shortness of breath and is going to have a test done. Denies seeing any blood in the stool or change of bowel habit. Continue to have weight loss.  Appetite is pretty good per patient. She lost 3 pounds since her visit with me 6 weeks ago.  Prior to that, she lost 4 pounds comparing to June record.  Total of 6 to 7 pounds since June 2020. Review of Systems  Constitutional: Positive for fatigue and unexpected weight change. Negative for appetite change, chills and fever.  HENT:   Negative for hearing loss and voice change.   Eyes: Negative for eye problems.  Respiratory: Positive for cough and shortness of breath. Negative for chest tightness.   Cardiovascular: Negative for chest pain.  Gastrointestinal: Negative for abdominal distention, abdominal pain and blood in stool.  Endocrine: Negative for hot flashes.  Genitourinary: Negative for difficulty urinating and frequency.   Musculoskeletal: Negative for arthralgias.  Skin: Negative for itching and rash.  Neurological: Negative for extremity weakness.  Hematological: Negative for adenopathy.  Psychiatric/Behavioral: Negative for confusion. The patient is not nervous/anxious.      MEDICAL HISTORY:  Past Medical History:  Diagnosis Date   Anginal pain ([place])    Cancer ([place])    CHF (congestive heart failure) ([place]) 01/22/2012   Chronic fatigue 07/15/2014   CVA (cerebral vascular accident) ([place]) 04/18/2018   GERD (gastroesophageal reflux disease) 05/12/2017  Hypertension    Hypothyroidism    ILD (interstitial lung disease) ([place]) 02/04/2012   IPF (idiopathic pulmonary fibrosis) ([place]) 2013   Mild intermittent asthma 01/29/2016   Pelvic relaxation    Prolapse of bladder    Shortness of breath    Thyroid disease     SURGICAL HISTORY: Past Surgical History:  Procedure Laterality Date   ANTERIOR AND POSTERIOR REPAIR  12/05/2011   Procedure: ANTERIOR (CYSTOCELE) AND POSTERIOR REPAIR (RECTOCELE);  Surgeon: [name], MD;  Location: [facility];  Service: Gynecology;  Laterality: N/A;  with cysto   Bladder tact  15 years ago   LEFT HEART CATHETERIZATION WITH CORONARY ANGIOGRAM N/A 01/22/2012   Procedure: LEFT HEART CATHETERIZATION WITH CORONARY ANGIOGRAM;  Surgeon: [name], MD;  Location: [facility] CATH LAB;  Service: Cardiovascular;  Laterality: N/A;   NECK SURGERY  2010   SHOULDER SURGERY     thyroid disease     THYROID LOBECTOMY Left 07/17/2018   Procedure: LEFT THYROID LOBECTOMY;  Surgeon: [name], MD;  Location: [facility];  Service: General;  Laterality: Left;   TONSILLECTOMY     VAGINAL HYSTERECTOMY  15 years ago    SOCIAL HISTORY: Social History   Socioeconomic History   Marital status: Married    Spouse name: Not on file   Number of children: Not on file   Years of education: Not on file   Highest education level: Not on file  Occupational History   Not on file  Social Needs   Financial resource strain: Not on file   Food insecurity    Worry: Not on file    Inability: Not on file   Transportation needs    Medical: Not on file    Non-medical: Not on file  Tobacco Use   Smoking status: Never Smoker   Smokeless tobacco: Never Used  Substance and Sexual Activity   Alcohol use: No   Drug use: No   Sexual activity: Never    Birth control/protection: Post-menopausal, Surgical    Comment: HYST  Lifestyle   Physical activity    Days per week: Not on file    Minutes per session: Not on file   Stress: Not on file  Relationships   Social connections    Talks on phone: Not on file    Gets together: Not on file    Attends religious service: Not on file    Active member of club or organization: Not on file    Attends meetings of clubs or organizations: Not on file    Relationship status: Not on file   Intimate partner violence    Fear of current or ex partner: Not on file    Emotionally abused: Not on file    Physically abused: Not on file    Forced  sexual activity: Not on file  Other Topics Concern   Not on file  Social History Narrative   Married.   2 children, 5 grandchildren.   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   Enjoys puzzles, sewing, traveling, reading.     FAMILY HISTORY: Family History  Problem Relation Age of Onset   Thyroid disease Mother    Hypertension Mother    Coronary artery disease Mother    Dementia Mother    Stroke Mother    Aneurysm Father    Pulmonary fibrosis Sister    Stroke Other    Sleep apnea Other    Cancer Brother        LIVER  Pulmonary fibrosis Brother     ALLERGIES:  is allergic to dilaudid [hydromorphone] and crestor [rosuvastatin calcium].  MEDICATIONS:  Current Outpatient Medications  Medication Sig Dispense Refill   acetaminophen (TYLENOL) 500 MG tablet Take 1,000 mg by mouth every 6 (six) hours as needed for mild pain or headache.      amLODipine (NORVASC) 5 MG tablet TAKE 1 TABLET BY MOUTH EVERY DAY FOR BLOOD PRESSURE 90 tablet 1   atorvastatin (LIPITOR) 40 MG tablet Take 1 tablet (40 mg total) by mouth at bedtime. For cholesterol. 90 tablet 3   budesonide-formoterol (SYMBICORT) 80-4.5 MCG/ACT inhaler Inhale 2 puffs into the lungs 2 (two) times daily. 1 Inhaler 0   CALCIUM CARBONATE-VITAMIN D PO Take 1 tablet by mouth daily.      clopidogrel (PLAVIX) 75 MG tablet Take 1 tablet (75 mg total) by mouth daily. For stroke prevention. 90 tablet 3   CRANBERRY PO Take 1 capsule by mouth every Monday, Wednesday, and Friday.      ESBRIET 801 MG TABS Take 1 tablet by mouth 3 (three) times daily.     ferrous sulfate 325 (65 FE) MG tablet Take 325 mg by mouth daily.     levothyroxine (SYNTHROID) 100 MCG tablet Take 1 tablet by mouth every morning on an empty stomach. No food or other medications for 30 minutes. 90 tablet 1   loratadine (CLARITIN) 10 MG tablet Take 10 mg by mouth daily.      metoprolol succinate (TOPROL-XL) 25 MG 24 hr tablet Take 1 tablet (25 mg total)  by mouth daily. 30 tablet 11   Multiple Vitamins-Minerals (CENTRUM SILVER PO) Take 1 tablet by mouth daily.     omeprazole (PRILOSEC) 20 MG capsule TAKE 1 CAPSULE BY MOUTH EVERY DAY 90 capsule 1   No current facility-administered medications for this visit.      PHYSICAL EXAMINATION: ECOG PERFORMANCE STATUS: 2 - Symptomatic, <50% confined to bed Vitals:   03/03/19 1019  BP: 139/84  Pulse: 89  Resp: 16  Temp: (!) 97 F (36.1 C)   Filed Weights   03/03/19 1019  Weight: 122 lb (55.3 kg)    Physical Exam Constitutional:      General: She is not in acute distress.    Comments: Frail appearance  HENT:     Head: Normocephalic and atraumatic.  Eyes:     General: No scleral icterus.    Pupils: Pupils are equal, round, and reactive to light.  Neck:     Musculoskeletal: Normal range of motion and neck supple.  Cardiovascular:     Rate and Rhythm: Normal rate and regular rhythm.     Heart sounds: Normal heart sounds.  Pulmonary:     Effort: Pulmonary effort is normal. No respiratory distress.     Breath sounds: No wheezing.     Comments: Severely diminished breath sound bilaterally, crackles Abdominal:     General: Bowel sounds are normal. There is no distension.     Palpations: Abdomen is soft. There is no mass.     Tenderness: There is no abdominal tenderness.  Musculoskeletal: Normal range of motion.        General: No deformity.  Skin:    General: Skin is warm and dry.     Coloration: Skin is not pale.     Findings: No erythema or rash.  Neurological:     General: No focal deficit present.     Mental Status: She is alert and oriented to person, place, and time.  Cranial Nerves: No cranial nerve deficit.     Coordination: Coordination normal.  Psychiatric:        Behavior: Behavior normal.        Thought Content: Thought content normal.     Comments: Anxious      LABORATORY DATA:  I have reviewed the data as listed Lab Results  Component Value Date    WBC 5.7 03/01/2019   HGB 12.0 03/01/2019   HCT 38.5 03/01/2019   MCV 83.3 03/01/2019   PLT 192 03/01/2019   Recent Labs    04/18/18 0931  04/20/18 0547 05/27/18 0808 07/09/18 1107 12/03/18 1133 12/31/18 1412 02/23/19 1153  NA 136   < > 138  --  136  --  138  --   K 4.3   < > 3.8  --  4.4  --  4.6  --   CL 103   < > 106  --  104  --  104  --   CO2 26  --  26  --  25  --  28  --   GLUCOSE 109*   < > 88  --  105*  --  99  --   BUN 11   < > 10  --  17  --  18  --   CREATININE 0.98   < > 0.81  --  0.85  --  0.92  --   CALCIUM 8.8*  --  8.7*  --  8.9  --  9.5  --   GFRNONAA 54*  --  >60  --  >60  --   --   --   GFRAA >60  --  >60  --  >60  --   --   --   PROT 6.4*  --   --  7.0  --  6.8  --  6.8  ALBUMIN 3.8  --   --  4.2  --  4.1  --  4.1  AST 23  --   --  17  --  20  --  20  ALT 12  --   --  12  --  14  --  13  ALKPHOS 52  --   --  64  --  56  --  47  BILITOT 0.5  --   --  0.4  --  0.3  --  0.3  BILIDIR  --   --   --  0.1  --  0.1  --  0.0   < > = values in this interval not displayed.   Iron/TIBC/Ferritin/ %Sat    Component Value Date/Time   IRON 43 03/01/2019 1108   TIBC 328 03/01/2019 1108   FERRITIN 28 03/01/2019 1108   IRONPCTSAT 13 03/01/2019 1108     RADIOGRAPHIC STUDIES: I have personally reviewed the radiological images as listed and agreed with the findings in the report. Ct Chest High Resolution  Result Date: 01/15/2019 CLINICAL DATA:  Interstitial lung disease, history of IPF, increased shortness of breath EXAM: CT CHEST WITHOUT CONTRAST TECHNIQUE: Multidetector CT imaging of the chest was performed following the standard protocol without intravenous contrast. High resolution imaging of the lungs, as well as inspiratory and expiratory imaging, was performed. COMPARISON:  11/28/2017, 10/09/2015, 01/24/2012 FINDINGS: Cardiovascular: Aortic atherosclerosis. Cardiomegaly. Left coronary artery calcifications. No pericardial effusion. Mediastinum/Nodes: No enlarged  mediastinal, hilar, or axillary lymph nodes. Large hiatal hernia with intrathoracic position of the gastric body and fundus. Thyroid gland, trachea, and esophagus demonstrate  no significant findings. Lungs/Pleura: Redemonstrated moderate pulmonary fibrosis in a pattern with an apical to basal gradient, featuring peripheral interstitial opacity and some suggestion of nodularity, mild traction bronchiectasis, and occasional areas of subpleural bronchiolectasis in the bases. There is no evidence of honeycombing. Moderate lobular air trapping on expiratory phase imaging. Findings are not significantly changed when compared to immediate prior examination dated 11/28/2017, however are slightly worsened over time on comparison to multiple prior examinations dating back to at least 01/24/2012. No pleural effusion or pneumothorax. Upper Abdomen: No acute abnormality. Musculoskeletal: No chest wall mass or suspicious bone lesions identified. IMPRESSION: 1. Redemonstrated moderate pulmonary fibrosis in a pattern with an apical to basal gradient, featuring peripheral interstitial opacity and some suggestion of nodularity, mild traction bronchiectasis, and occasional areas of subpleural bronchiolectasis in the bases. There is no evidence of honeycombing. Moderate lobular air trapping on expiratory phase imaging. Findings are not significantly changed when compared to immediate prior examination dated 11/28/2017, however are slightly worsened over time on comparison to multiple prior examinations dating back to at least 01/24/2012. Findings remain indeterminate for UIP and differential considerations include both UIP and chronic fibrosing NSIP. 2.  Coronary artery disease and aortic atherosclerosis. 3.  Hiatal hernia. Electronically Signed   By: Eddie Candle M.D.   On: 01/15/2019 12:09   Dg Bone Density  Result Date: 01/08/2019 EXAM: DUAL X-RAY ABSORPTIOMETRY (DXA) FOR BONE MINERAL DENSITY IMPRESSION: Referring Physician:   Orpah Melter Your patient completed a BMD test using Lunar IDXA DXA system ( analysis version: 16 ) manufactured by [employer]. Technologist: AW PATIENT: Name: Tina Calderon, Tina Calderon Patient ID: [id number] Birth Date: 1940-10-12 Height: 62.0 in. Sex: Female Measured: 01/08/2019 Weight: 128.4 lbs. Indications: Advanced Age, Bilateral Ovariectomy (65.51), Caucasian, Estrogen Deficient, Hypothyroid, Levothyroxine, Omeprazole, Postmenopausal Fractures: None Treatments: Calcium (E943.0), Vitamin D (E933.5) ASSESSMENT: The BMD measured at Femur Neck Right is 0.716 g/cm2 with a T-score of -2.3. This patient is considered osteopenic according to [facility]) criteria. There has been no statistically significant change in BMD of Left hip since prior exam dated 04/29/2001. The scan quality is good. L-3 and L-4 were excluded due to degenerative changes. Spine was not compared to prior study due to exclusion of vertebral bodies on current exam. Prior DXA exam performed on Hologic device measures only unilateral hip (not Total Mean Hip). Therefore, current Total Mean Hip cannot be compared to prior exam. Site Region Measured Date Measured Age YA BMD Significant CHANGE T-score DualFemur Neck Right 01/08/2019    78.0         -2.3    0.716 g/cm2 AP Spine  L1-L2      01/08/2019    78.0         -0.9    1.058 g/cm2 DualFemur Neck Left  01/08/2019    78.0         -2.2    0.727 g/cm2 DualFemur Neck Left  04/29/2001    61.3         -2.0    0.756 g/cm2 World Health Organization [facility]) criteria for post-menopausal, Caucasian Women: Normal       T-score at or above -1 SD Osteopenia   T-score between -1 and -2.5 SD Osteoporosis T-score at or below -2.5 SD RECOMMENDATION: 1. All patients should optimize calcium and vitamin D intake. 2. Consider FDA approved medical therapies in postmenopausal women and men aged 16 years and older, based on the following: a. A hip or vertebral (clinical or morphometric) fracture  b. T- score <  or = -2.5 at the femoral neck or spine after appropriate evaluation to exclude secondary causes c. Low bone mass (T-score between -1.0 and -2.5 at the femoral neck or spine) and a 10 year probability of a hip fracture > or = 3% or a 10 year probability of a major osteoporosis-related fracture > or = 20% based on the [place]-adapted WHO algorithm d. Clinician judgment and/or patient preferences may indicate treatment for people with 10-year fracture probabilities above or below these levels FOLLOW-UP: Patients with diagnosis of osteoporosis or at high risk for fracture should have regular bone mineral density tests. For patients eligible for Medicare, routine testing is allowed once every 2 years. The testing frequency can be increased to one year for patients who have rapidly progressing disease, those who are receiving or discontinuing medical therapy to restore bone mass, or have additional risk factors. I have reviewed this report and agree with the above findings. [facility] FRAX* 10-year Probability of Fracture Based on femoral neck BMD: DualFemur (Right) Major Osteoporotic Fracture: 17.0% Hip Fracture:                5.7% Population:                  [place] (Caucasian) Risk Factors:                None *FRAX is a Materials engineer of the [employer] for Metabolic Bone Disease, a World Pharmacologist (WHO) [facility]. ASSESSMENT: The probability of a major osteoporotic fracture is 17.0 % within the next ten years. The probability of a hip fracture is 5.7 % within the next ten years. Electronically Signed   By: Lajean Manes M.D.   On: 01/08/2019 12:22   Mm 3d Screen Breast Bilateral  Result Date: 12/07/2018 CLINICAL DATA:  Screening. EXAM: DIGITAL SCREENING BILATERAL MAMMOGRAM WITH TOMO AND CAD COMPARISON:  Previous exam(s). ACR Breast Density Category b: There are scattered areas of fibroglandular density. FINDINGS: There are no findings suspicious for  malignancy. Images were processed with CAD. IMPRESSION: No mammographic evidence of malignancy. A result letter of this screening mammogram will be mailed directly to the patient. RECOMMENDATION: Screening mammogram in one year. (Code:SM-B-01Y) BI-RADS CATEGORY  1: Negative. Electronically Signed   By: Margarette [place] M.D.   On: 12/07/2018 07:55      ASSESSMENT & PLAN:  1. Iron deficiency anemia, unspecified iron deficiency anemia type   2. Weight loss   3. Other fatigue    #Iron deficiency anemia, Labs reviewed and discussed with patient. Her hemoglobin and iron stores have both improved. Recommend patient to continue take oral iron supplementation 325 mg 2-3 times a day.  She has tolerated the treatments well.  She prefers to use her over-the-counter supplies.  #Weight loss, etiology unknown. Last colonoscopy was reported to be in 2006. I had another discussion with patient today regarding establish care with gastroenterology for additional evaluation. Patient is reluctant because she is currently stressed due to multiple medical problems.  She feels that her breathing is not very well recently and her husband health is also not well.  She prefers not to proceed with GI evaluation. And we discussed about maybe obtaining Cologuard testing.  She is interested in and recommend patient to discuss with patient's primary care provider.  We discussed about also obtain CT abdomen pelvis for further discussion and she declined.  She will call me if she changes her mind.  Orders Placed This Encounter  Procedures   CBC with Differential/Platelet    Standing Status:   Future    Standing Expiration Date:   03/02/2020   Iron and TIBC    Standing Status:   Future    Standing Expiration Date:   03/02/2020   Ferritin    Standing Status:   Future    Standing Expiration Date:   03/02/2020    All questions were answered. The patient knows to call the clinic with any problems questions or  concerns. Roberts Gaudy, NP  Return of visit: 3 months  Earlie Server, MD, PhD 03/03/2019

## 2019-03-04 ENCOUNTER — Ambulatory Visit: Payer: Medicare Other | Admitting: Emergency Medicine

## 2019-03-04 ENCOUNTER — Other Ambulatory Visit: Payer: Self-pay | Admitting: Primary Care

## 2019-03-04 DIAGNOSIS — I700 Atherosclerosis of aorta: Secondary | ICD-10-CM | POA: Insufficient documentation

## 2019-03-04 DIAGNOSIS — Z1211 Encounter for screening for malignant neoplasm of colon: Secondary | ICD-10-CM

## 2019-03-04 NOTE — Assessment & Plan Note (Signed)
Noted on imaging. Patient managed on statin and clopidogrel. LDL from July 2020 at goal.

## 2019-03-10 ENCOUNTER — Telehealth: Payer: Self-pay | Admitting: Pharmacy Technician

## 2019-03-10 NOTE — Telephone Encounter (Signed)
Received notification from [facility] regarding a prior authorization for Ofev 123m. Authorization has been APPROVED from 03/10/19 to 06/10/19.   Will send document to scan center.  Authorization # [id number] # [phone] Ran test claim for 1 month supply- patient's copay is $0.00. Called patient to advise- left message. Please send prescription in for patient. It appears she filled through [facility] the past.  9:46 AM RBeatriz Chancellor CPhT

## 2019-03-10 NOTE — Telephone Encounter (Signed)
Pt requesting a call back.  Had a few questions for Rachael.  [phone].

## 2019-03-10 NOTE — Telephone Encounter (Signed)
Submitted a Prior Authorization request to [facility] for OFEV 139m via Cover My Meds. Will update once we receive a response.  Auth# [id number] 9:30 AM RBeatriz Chancellor CPhT

## 2019-03-10 NOTE — Telephone Encounter (Signed)
Returned patient's call. She wanted to see if we knew what her Ofev copay would be. I advised all of the information above and also discussed that we can look into grant foundations if her copay ever becomes unaffordable. Also, confirmed with patient that prescription will go to [facility]. Patient had no other questions or concerns at this time.  3:09 PM Beatriz Chancellor, CPhT

## 2019-03-12 ENCOUNTER — Ambulatory Visit (INDEPENDENT_AMBULATORY_CARE_PROVIDER_SITE_OTHER): Payer: Medicare Other | Admitting: Emergency Medicine

## 2019-03-12 ENCOUNTER — Other Ambulatory Visit: Payer: Self-pay

## 2019-03-12 DIAGNOSIS — J84112 Idiopathic pulmonary fibrosis: Secondary | ICD-10-CM | POA: Diagnosis not present

## 2019-03-12 LAB — PULMONARY FUNCTION TEST
DL/VA % pred: 87 %
DL/VA: 3.62 ml/min/mmHg/L
DLCO unc % pred: 49 %
DLCO unc: 8.71 ml/min/mmHg
FEF 25-75 Post: 1.96 L/sec
FEF 25-75 Pre: 1.55 L/sec
FEF2575-%Change-Post: 26 %
FEF2575-%Pred-Post: 145 %
FEF2575-%Pred-Pre: 114 %
FEV1-%Change-Post: 3 %
FEV1-%Pred-Post: 54 %
FEV1-%Pred-Pre: 52 %
FEV1-Post: 0.97 L
FEV1-Pre: 0.94 L
FEV1FVC-%Change-Post: 4 %
FEV1FVC-%Pred-Pre: 117 %
FEV6-%Change-Post: 0 %
FEV6-%Pred-Post: 46 %
FEV6-%Pred-Pre: 47 %
FEV6-Post: 1.07 L
FEV6-Pre: 1.07 L
FEV6FVC-%Pred-Post: 105 %
FEV6FVC-%Pred-Pre: 105 %
FVC-%Change-Post: 0 %
FVC-%Pred-Post: 44 %
FVC-%Pred-Pre: 44 %
FVC-Post: 1.07 L
FVC-Pre: 1.08 L
Post FEV1/FVC ratio: 91 %
Post FEV6/FVC ratio: 100 %
Pre FEV1/FVC ratio: 87 %
Pre FEV6/FVC Ratio: 100 %
RV % pred: 57 %
RV: 1.32 L
TLC % pred: 51 %
TLC: 2.47 L

## 2019-03-12 NOTE — Progress Notes (Signed)
PFT done today. 

## 2019-03-17 ENCOUNTER — Telehealth: Payer: Self-pay | Admitting: Internal Medicine

## 2019-03-17 NOTE — Telephone Encounter (Signed)
Called [employer], and rep confirmed patient's 1st shipment of Ofev is scheduled to ship on Oct. 12th

## 2019-03-17 NOTE — Telephone Encounter (Signed)
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

## 2019-03-19 NOTE — Telephone Encounter (Signed)
Spoke with the pt  She states that she is supposed to hget her ofev shipped out to her Monday 10/12 /20  Will forward to MR to make him aware

## 2019-03-21 NOTE — Progress Notes (Signed)
_0  ID: Tina Calderon, female    DOB: 09-Aug-1940, 78 y.o.   MRN: [id number]  Chief Complaint  Patient presents with  . Follow-up    F/U on PFT. States her breathing has been ok since last visit. Stopped the Esbriet a week ago. Will receive the Ofev today.     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 Negative CTD workup in 2014. Family history of pulmonary fibrosis (sibling)  PMH: Hypothyroidism, anemia, chronic fatigue, hypertension, GERD, prediabetes, history of CVA, chronic diastolic heart failure Smoker/ Smoking History: Never smoker Maintenance:  Esbriet  Pt of: Dr. Lamonte Sakai    03/22/2019  - Visit   78 year old female followed in our office for IPF.  Patient was last seen at our office for a interstitial lung disease consult with Dr. [name] at the request of Wyn Quaker, NP and Dr. Lamonte Sakai.  Patient has had a recent high-resolution CT chest that showed stability compared to 2019 but slight progression from previous imaging before that.  Patient also has completed a recent pulmonary function test that does show progression in worsened FVC as well as DLCO.  Those results are listed below:  03/12/2019-pulmonary function test- FVC 1.08 (44% predicted), postbronchodilator ratio 91, postbronchodilator FEV1 0.97 (54% predicted), DLCO 8.71 (49% predicted)  Patient had previously been managed on anti-fibrotic such as Esbriet.  The patient had decided with Dr. [name] to switch to [facility] from [facility].  She should be receiving 0FEV this week.  Patient completed a previous walk in our office in August/2020 where she required oxygen with physical exertion.  Patient reporting today that she feels that she is been doing okay.  She still gets occasional shortness with physical exertion.  Patient admits she has not been utilizing her oxygen as often as she should with physical exertion.  She reports that she has  been trying to maintain her oxygen saturations by taking breaks in between physical exertion to catch her breath.  She admits that with room air with physical exertion her oxygen levels do drop to sometimes the high 70s low 80s.  She reports that when she applies oxygen 2 L continuous her oxygen levels resume above 90%.  She would like to discuss how best to utilize her oxygen today.  Patient also admits she did not notice a significant improvement with her breathing when she was trialed on Symbicort.   Questionaires / Pulmonary Flowsheets:   MMRC: mMRC Dyspnea Scale mMRC Score  03/22/2019 2    Tests:   01/15/2019-CT chest high-res-redemonstrated moderate pulmonary fibrosis in a pattern with apical to basilar gradient featuring peripheral interstitial opacity and suggestion of nodularity, mild traction bronchiectasis and occasional areas of subpleural bronchiolectasis in the bases, no evidence of honeycombing, findings remain indeterminate for UIP and differential considerations include both UIP as well as chronic fibrosing NSIP.  Findings are relatively stable from 2019 CT.  04/19/2018-echocardiogram-LV ejection fraction 65 to 70%, PA P pressure 63  10/23/2015-pulmonary function test- FVC 1.44 (56% predicted), postbronchodilator ratio 86, postbronchodilator FEV1 1.26 (66% predicted), no bronchodilator response, mid flow reversibility, DLCO 12.65   03/12/2019-pulmonary function test- FVC 1.08 (44% predicted), postbronchodilator ratio 91, postbronchodilator FEV1 0.97 (54% predicted), DLCO 8.71 (49% predicted)  CTD Labwork (2014) - neg ANA, ANCA, RA factor , nml ESR , CRP  02/25/2019-connective tissue work-up CCP less than 16 FANA staining pattern's-homogenous pattern 1: 320, speckled pattern 1: 320 Rheumatoid factor less than  14 ANA titer positive Double-stranded DNA AB- ENA RNP AB-0.6 ENA SM AB serology- less than 0.2 Scleroderma antibody-negative ENA SSA Ro AB--0.2 ENA SSB LA AB 2.3    FENO:  No results found for: NITRICOXIDE  PFT: PFT Results Latest Ref Rng & Units 03/12/2019 10/23/2015 03/17/2015 01/03/2015 07/15/2014 02/15/2014  FVC-Pre L 1.08 1.44 1.47 1.52 1.59 1.58  FVC-Predicted Pre % 44 56 57 59 61 60  FVC-Post L 1.07 1.47 - 1.49 1.58 1.55  FVC-Predicted Post % 44 57 - 57 60 59  Pre FEV1/FVC % % 87 85 84 83 84 84  Post FEV1/FCV % % 91 86 - 86 85 87  FEV1-Pre L 0.94 1.22 1.24 1.26 1.33 1.33  FEV1-Predicted Pre % 52 64 64 65 68 68  FEV1-Post L 0.97 1.26 - 1.29 1.35 1.35  DLCO UNC% % 49 58 56 63 63 66  DLCO COR %Predicted % 87 105 109 110 108 104  TLC L 2.47 - - - 3.01 3.05  TLC % Predicted % 51 - - - 63 64  RV % Predicted % 57 - - - 61 60    WALK:  SIX MIN WALK 02/23/2019 12/03/2018 12/01/2017 05/01/2016 10/29/2013 12/04/2012 02/21/2012  Medications norvasc 48m, plavix 785m synthroid 10028m claritin 67m85moprol-xl 25mg48mv, prilosec 20mg.4mriet 801 mg @ 0730 - Levothyroxine, Esbriet, Loratdine, and Omeprazole all taken at 6:45 AM Synthroid 88mcg,49mritin 67mg, P35msec 20mg- ta32mat 0630. Esbriet 801mg take100m 0800 - - -  Supplimental Oxygen during Test? (L/min) Yes _0  No  Laps 10 - 9 9 - - -  Partial Lap (in Meters) 23 - 30 12 - - -  Baseline BP (sitting) 128/74 - 120/78 140/82 - - -  Baseline Heartrate 94 - 101 100 - - -  Baseline Dyspnea (Borg Scale) 2 - 1 3 - - -  Baseline Fatigue (Borg Scale) 2 - 1 2 - - -  Baseline SPO2 97 - 98 97 - - -  BP (sitting) 160/70 - 140/76 192/98 - - -  Heartrate 126 - 129 139 - - -  Dyspnea (Borg Scale) 10 - 3 3 - - -  Fatigue (Borg Scale) 10 - 3 4 - - -  SPO2 87 - 96 92 - - -  BP (sitting) 150/80 - 132/78 170/92 - - -  Heartrate 103 - 106 113 - - -  SPO2 100 - 99 98 - - -  Stopped or Paused before Six Minutes Yes - No No - - -  Other Symptoms at end of Exercise patient left weak and short of breath. O2sats 80% patient placed on 2L of @ to finish out the last 2:38 of walk- at end of walk still 87% on 2L - - -  - - -  Interpretation Hip pain;Leg pain - Hip pain - - - -  Distance Completed 363 - 462 444 - - -  Tech Comments: patient stared walk on room air at 97%. patient walked and had to stop with 2:38 left in her 6 minutes. O2 sat 80%. O2 at 2L placed on patient to finish rest of walk. when 2:38 was up patient stopped O2 check at 87% still on 2L all vitals taken for walk, then walked patient another 1.5 laps to make sure she could maintime O2 sats at 3L at end of walk patient maintained sats 94%. patient then scheduled with NP Brian MackWyn Quakerw up on shortness of breath and desaturation. -  patient was very anxious as well as emotional before test. Pt ambulated with a steady gait.  - pt only completed 2 laps, stated she was "very short winded"//ldc Pt completed the walk with little difficulties...increased sob with increased heart rate.    Imaging: No results found.  Lab Results:  CBC    Component Value Date/Time   WBC 5.7 03/01/2019 1108   RBC 4.62 03/01/2019 1108   HGB 12.0 03/01/2019 1108   HGB 12.4 12/18/2013 1504   HCT 38.5 03/01/2019 1108   HCT 38.8 12/18/2013 1504   PLT 192 03/01/2019 1108   PLT 173 12/18/2013 1504   MCV 83.3 03/01/2019 1108   MCV 83 12/18/2013 1504   MCH 26.0 03/01/2019 1108   MCHC 31.2 03/01/2019 1108   RDW 21.8 (H) 03/01/2019 1108   RDW 14.4 12/18/2013 1504   LYMPHSABS 0.9 03/01/2019 1108   LYMPHSABS 0.6 (L) 12/18/2013 1504   MONOABS 0.6 03/01/2019 1108   MONOABS 0.7 12/18/2013 1504   EOSABS 0.2 03/01/2019 1108   EOSABS 0.1 12/18/2013 1504   BASOSABS 0.0 03/01/2019 1108   BASOSABS 0.0 12/18/2013 1504    BMET    Component Value Date/Time   NA 138 12/31/2018 1412   NA 138 12/18/2013 1504   K 4.6 12/31/2018 1412   K 3.4 (L) 12/18/2013 1504   CL 104 12/31/2018 1412   CL 103 12/18/2013 1504   CO2 28 12/31/2018 1412   CO2 24 12/18/2013 1504   GLUCOSE 99 12/31/2018 1412   GLUCOSE 125 (H) 12/18/2013 1504   BUN 18 12/31/2018 1412   BUN 17 12/18/2013  1504   CREATININE 0.92 12/31/2018 1412   CREATININE 0.88 12/18/2013 1504   CALCIUM 9.5 12/31/2018 1412   CALCIUM 8.5 12/18/2013 1504   GFRNONAA >60 07/09/2018 1107   GFRNONAA >60 12/18/2013 1504   GFRAA >60 07/09/2018 1107   GFRAA >60 12/18/2013 1504    BNP    Component Value Date/Time   BNP 100.9 (H) 04/18/2018 0931    ProBNP No results found for: PROBNP  Specialty Problems      Pulmonary Problems   IPF (idiopathic pulmonary fibrosis) / IPAF    CT chest 01/24/12 >areas of ground-glass attenuation,  architectural distortion and some very mild cylindrical traction  bronchiectasis, as well as marked thickening of the  peribronchovascular interstitium.  01/15/2019-CT chest high-res-redemonstrated moderate pulmonary fibrosis in a pattern with apical to basilar gradient featuring peripheral interstitial opacity and suggestion of nodularity, mild traction bronchiectasis and occasional areas of subpleural bronchiolectasis in the bases, no evidence of honeycombing, findings remain indeterminate for UIP and differential considerations include both UIP as well as chronic fibrosing NSIP.  Findings are relatively stable from 2019 CT.  10/23/2015-pulmonary function test- FVC 1.44 (56% predicted), postbronchodilator ratio 86, postbronchodilator FEV1 1.26 (66% predicted), no bronchodilator response, mid flow reversibility, DLCO 12.65   CTD Labwork (2014) - neg ANA, ANCA, RA factor , nml ESR , CRP  02/25/2019-connective tissue work-up CCP less than 16 FANA staining pattern's-homogenous pattern 1: 320, speckled pattern 1: 320 Rheumatoid factor less than 14 ANA titer positive Double-stranded DNA AB- ENA RNP AB-0.6 ENA SM AB serology- less than 0.2 Scleroderma antibody-negative ENA SSA Ro AB--0.2 ENA SSB LA AB 2.3      Cough   Allergic rhinitis   Mild intermittent asthma   Cough variant asthma    06/13/2016  After extensive coaching HFA effectiveness =    75% from a basline of < 25%  Chronic respiratory failure with hypoxia (HCC)      Allergies  Allergen Reactions  . Dilaudid [Hydromorphone] Nausea And Vomiting  . Crestor [Rosuvastatin Calcium] Other (See Comments)    Myalgias, muscle weakness    Immunization History  Administered Date(s) Administered  . Influenza Split 02/22/2012, 03/01/2013, 02/08/2014, 01/27/2015  . Influenza Whole 04/11/2011, 02/10/2017  . Influenza, High Dose Seasonal PF 02/16/2017, 01/20/2019  . Influenza,inj,Quad PF,6+ Mos 03/11/2016  . Influenza-Unspecified 01/26/2018  . Pneumococcal Conjugate-13 03/29/2014  . Pneumococcal Polysaccharide-23 12/08/2017  . Pneumococcal-Unspecified 04/11/2011, 05/13/2016  . Td 08/30/2008  . Tdap 11/16/2017  . Zoster 08/07/2011  . Zoster Recombinat (Shingrix) 09/20/2016, 02/19/2017    Past Medical History:  Diagnosis Date  . Anginal pain ([place])   . Cancer ([place])   . CHF (congestive heart failure) ([place]) 01/22/2012  . Chronic fatigue 07/15/2014  . CVA (cerebral vascular accident) ([place]) 04/18/2018  . GERD (gastroesophageal reflux disease) 05/12/2017  . Hypertension   . Hypothyroidism   . ILD (interstitial lung disease) ([place]) 02/04/2012  . IPF (idiopathic pulmonary fibrosis) ([place]) 2013  . Mild intermittent asthma 01/29/2016  . Pelvic relaxation   . Prolapse of bladder   . Shortness of breath   . Thyroid disease     Tobacco History: Social History   Tobacco Use  Smoking Status Never Smoker  Smokeless Tobacco Never Used   Counseling given: Not Answered   Continue to not smoke  Outpatient Encounter Medications as of 03/22/2019  Medication Sig  . acetaminophen (TYLENOL) 500 MG tablet Take 1,000 mg by mouth every 6 (six) hours as needed for mild pain or headache.   [name] amLODipine (NORVASC) 5 MG tablet TAKE 1 TABLET BY MOUTH EVERY DAY FOR BLOOD PRESSURE  . atorvastatin (LIPITOR) 40 MG tablet Take 1 tablet (40 mg total) by mouth at bedtime. For cholesterol.  . budesonide-formoterol (SYMBICORT)  80-4.5 MCG/ACT inhaler Inhale 2 puffs into the lungs 2 (two) times daily.  [name] CALCIUM CARBONATE-VITAMIN D PO Take 1 tablet by mouth daily.   . clopidogrel (PLAVIX) 75 MG tablet Take 1 tablet (75 mg total) by mouth daily. For stroke prevention.  [name] CRANBERRY PO Take 1 capsule by mouth every Monday, Wednesday, and Friday.   . ferrous sulfate 325 (65 FE) MG tablet Take 325 mg by mouth daily.  [name] levothyroxine (SYNTHROID) 100 MCG tablet Take 1 tablet by mouth every morning on an empty stomach. No food or other medications for 30 minutes.  [name] loratadine (CLARITIN) 10 MG tablet Take 10 mg by mouth daily.   . metoprolol succinate (TOPROL-XL) 25 MG 24 hr tablet Take 1 tablet (25 mg total) by mouth daily.  . Multiple Vitamins-Minerals (CENTRUM SILVER PO) Take 1 tablet by mouth daily.  [name] omeprazole (PRILOSEC) 20 MG capsule TAKE 1 CAPSULE BY MOUTH EVERY DAY  . [DISCONTINUED] ESBRIET 801 MG TABS Take 1 tablet by mouth 3 (three) times daily.   No facility-administered encounter medications on file as of 03/22/2019.      Review of Systems  Review of Systems  Constitutional: Negative for activity change, fatigue and fever.  HENT: Negative for sinus pressure, sinus pain and sore throat.   Respiratory: Negative for cough, shortness of breath and wheezing.   Cardiovascular: Negative for chest pain and palpitations.  Gastrointestinal: Negative for diarrhea, nausea and vomiting.  Musculoskeletal: Negative for arthralgias.  Neurological: Negative for dizziness.  Psychiatric/Behavioral: Negative for sleep disturbance. The patient is not nervous/anxious.      Physical Exam  BP 124/72 (BP  Location: Left Arm, Patient Position: Sitting, Cuff Size: Normal)   Pulse 81   Temp (!) 97.4 F (36.3 C) (Temporal)   Ht 5' 2.5" (1.588 m)   Wt 125 lb 6.4 oz (56.9 kg)   SpO2 96%   BMI 22.57 kg/m   Wt Readings from Last 5 Encounters:  03/22/19 125 lb 6.4 oz (56.9 kg)  03/03/19 122 lb (55.3 kg)  02/25/19 127 lb 3.2  oz (57.7 kg)  02/08/19 128 lb (58.1 kg)  01/20/19 125 lb 1.6 oz (56.7 kg)    BMI Readings from Last 5 Encounters:  03/22/19 22.57 kg/m  03/03/19 21.96 kg/m  02/25/19 22.89 kg/m  02/08/19 23.04 kg/m  01/20/19 22.52 kg/m     Physical Exam Vitals signs and nursing note reviewed.  Constitutional:      General: She is not in acute distress.    Appearance: Normal appearance. She is normal weight.  HENT:     Head: Normocephalic and atraumatic.     Right Ear: Tympanic membrane, ear canal and external ear normal. There is no impacted cerumen.     Left Ear: Tympanic membrane, ear canal and external ear normal. There is no impacted cerumen.     Nose: Nose normal. No congestion or rhinorrhea.     Mouth/Throat:     Mouth: Mucous membranes are moist.     Pharynx: Oropharynx is clear.  Eyes:     Pupils: Pupils are equal, round, and reactive to light.  Neck:     Musculoskeletal: Normal range of motion.  Cardiovascular:     Rate and Rhythm: Normal rate and regular rhythm.     Pulses: Normal pulses.     Heart sounds: Normal heart sounds. No murmur.  Pulmonary:     Effort: Pulmonary effort is normal. No respiratory distress.     Breath sounds: No decreased air movement. Rales (Bibasilar crackles) present. No decreased breath sounds or wheezing.  Musculoskeletal:     Right lower leg: No edema.     Left lower leg: No edema.  Skin:    General: Skin is warm and dry.     Capillary Refill: Capillary refill takes less than 2 seconds.  Neurological:     General: No focal deficit present.     Mental Status: She is alert and oriented to person, place, and time. Mental status is at baseline.     Gait: Gait (Tolerated walk in office today for POC) normal.  Psychiatric:        Mood and Affect: Mood normal.        Behavior: Behavior normal.        Thought Content: Thought content normal.        Judgment: Judgment normal.       Assessment & Plan:   IPF (idiopathic pulmonary fibrosis) /  IPAF Discussed case with Dr. [name].  Patient could be IPAF with recent ANA positive as well as faint staining patterns.  Patient still with progressive phenotype.  We will proceed forward with anti-fibrotic's as previously recommended.  Plan: Continue to not take esbriet Start 0FEV when you receive it >>> Take 1 tablet daily for 7 days then increase to full dosing 1 tablet every 12 hours Walk today in office for POC Wear oxygen 2 to 3 L with physical exertion For POC >>> 2 to 3 L pulsed with physical exertion We will repeat spirometry with DLCO in 6 months, March/2021 We will order overnight oximetry that was not completed yet Follow-up with our  office in 5 weeks with hepatic function panel  Mild intermittent asthma No improvement with trial of Symbicort 80, no need to continue  Therapeutic drug monitoring Plan: Repeat hepatic function panel in 5 weeks  Pulmonary HTN ([place]) Plan: Continue to monitor clinically  Chronic respiratory failure with hypoxia ([place]) Plan: We will order overnight oximetry on room air to assess for nocturnal hypoxia Continue to wear oxygen with physical exertion 2 L continuous If you decide to proceed forward with a POC need to wear with 2 to 3 L pulsed    Return in about 5 weeks (around 04/26/2019), or if symptoms worsen or fail to improve, for Follow up with Dr. Purnell Shoemaker, Follow up with Wyn Quaker FNP-C.   Lauraine Rinne, NP 03/22/2019   This appointment was 42 minutes long with over 50% of the time in direct face-to-face patient care, assessment, plan of care, and follow-up.

## 2019-03-22 ENCOUNTER — Ambulatory Visit: Payer: Medicare Other | Admitting: Pulmonary Disease

## 2019-03-22 ENCOUNTER — Encounter: Payer: Self-pay | Admitting: Pulmonary Disease

## 2019-03-22 ENCOUNTER — Other Ambulatory Visit: Payer: Self-pay

## 2019-03-22 VITALS — BP 124/72 | HR 81 | Temp 97.4°F | Ht 62.5 in | Wt 125.4 lb

## 2019-03-22 DIAGNOSIS — J9611 Chronic respiratory failure with hypoxia: Secondary | ICD-10-CM

## 2019-03-22 DIAGNOSIS — I272 Pulmonary hypertension, unspecified: Secondary | ICD-10-CM

## 2019-03-22 DIAGNOSIS — Z5181 Encounter for therapeutic drug level monitoring: Secondary | ICD-10-CM | POA: Diagnosis not present

## 2019-03-22 DIAGNOSIS — J84112 Idiopathic pulmonary fibrosis: Secondary | ICD-10-CM

## 2019-03-22 DIAGNOSIS — J4520 Mild intermittent asthma, uncomplicated: Secondary | ICD-10-CM

## 2019-03-22 NOTE — Telephone Encounter (Signed)
Okay noted.  Let us know if you have any questions or concerns.Wyn Quaker, FNP

## 2019-03-22 NOTE — Assessment & Plan Note (Signed)
No improvement with trial of Symbicort 80, no need to continue

## 2019-03-22 NOTE — Assessment & Plan Note (Signed)
Plan: We will order overnight oximetry on room air to assess for nocturnal hypoxia Continue to wear oxygen with physical exertion 2 L continuous If you decide to proceed forward with a POC need to wear with 2 to 3 L pulsed

## 2019-03-22 NOTE — Assessment & Plan Note (Addendum)
Discussed case with Dr. [name].  Patient could be IPAF with recent ANA positive as well as faint staining patterns.  Patient still with progressive phenotype.  We will proceed forward with anti-fibrotic's as previously recommended.  Plan: Continue to not take esbriet Start 0FEV when you receive it >>> Take 1 tablet daily for 7 days then increase to full dosing 1 tablet every 12 hours Walk today in office for POC Wear oxygen 2 to 3 L with physical exertion For POC >>> 2 to 3 L pulsed with physical exertion We will repeat spirometry with DLCO in 6 months, March/2021 We will order overnight oximetry that was not completed yet Follow-up with our office in 5 weeks with hepatic function panel

## 2019-03-22 NOTE — Assessment & Plan Note (Signed)
Plan: Repeat hepatic function panel in 5 weeks

## 2019-03-22 NOTE — Assessment & Plan Note (Signed)
Plan: Continue to monitor clinically

## 2019-03-22 NOTE — Patient Instructions (Addendum)
You were seen today by Lauraine Rinne, NP  for:   1. IPF (idiopathic pulmonary fibrosis) ([place])  Continue to not take Esbriet  When you receive your 0FEV start taking 1 tablet daily for 7 days, then increase to 1 tablet every 12 hours.  Take your 0FEV with food  We will plan on repeating your spirometry with DLCO in March/2021 with a office visit following it with Dr. [name]  - Pulse oximetry, overnight; Future  2. Chronic respiratory failure with hypoxia (HCC)  Walk today in office to see if you qualify for POC  Continue oxygen therapy as prescribed - 2L continuous with exertion, 2-3L pulsed with POC  >>>maintain oxygen saturations greater than 88 percent  >>>if unable to maintain oxygen saturations please contact the office  >>>do not smoke with oxygen  >>>can use nasal saline gel or nasal saline rinses to moisturize nose if oxygen causes dryness  - Pulse oximetry, overnight; Future  3.  Therapeutic drug monitoring  We will need you to complete hepatic function panel in about 5 weeks.  - Hepatic function panel; Future   We recommend today:  Orders Placed This Encounter  Procedures  . Hepatic function panel    Standing Status:   Future    Standing Expiration Date:   03/21/2020  . Pulse oximetry, overnight    Standing Status:   Future    Standing Expiration Date:   03/21/2020    Scheduling Instructions:     On RA   Orders Placed This Encounter  Procedures  . Hepatic function panel  . Pulse oximetry, overnight   No orders of the defined types were placed in this encounter.   Follow Up:    Return in about 5 weeks (around 04/26/2019), or if symptoms worsen or fail to improve, for Follow up with Dr. Purnell Shoemaker, Follow up with Wyn Quaker FNP-C.  >>> If appointment made with Dr. [name] this will need to be a 30-minute office visit.  Please do your part to reduce the spread of COVID-19:      Reduce your risk of any infection  and COVID19 by using the  similar precautions used for avoiding the common cold or flu:  [name] Wash your hands often with soap and warm water for at least 20 seconds.  If soap and water are not readily available, use an alcohol-based hand sanitizer with at least 60% alcohol.  . If coughing or sneezing, cover your mouth and nose by coughing or sneezing into the elbow areas of your shirt or coat, into a tissue or into your sleeve (not your hands). Langley Gauss A MASK when in public  . Avoid shaking hands with others and consider head nods or verbal greetings only. . Avoid touching your eyes, nose, or mouth with unwashed hands.  . Avoid close contact with people who are sick. . Avoid places or events with large numbers of people in one location, like concerts or sporting events. . If you have some symptoms but not all symptoms, continue to monitor at home and seek medical attention if your symptoms worsen. . If you are having a medical emergency, call 911.   Jette / e-Visit: [email or web address]         [facility]: [facility]: [phone]                   [facility]: [phone]  It is flu season:   >>> Best ways to protect herself from the flu: Receive the yearly flu vaccine, practice good hand hygiene washing with soap and also using hand sanitizer when available, eat a nutritious meals, get adequate rest, hydrate appropriately   Please contact the office if your symptoms worsen or you have concerns that you are not improving.   Thank you for choosing [facility] for your healthcare, and for allowing [place] to partner with you on your healthcare journey. I am thankful to be able to provide care to you today.   Wyn Quaker FNP-C

## 2019-03-22 NOTE — Telephone Encounter (Signed)
Noted.

## 2019-03-23 NOTE — Telephone Encounter (Signed)
Wyn Quaker the physician assistant told me that patient is ready to start the nintedanib anytime now.  Therefore we will close the message

## 2019-04-01 LAB — COLOGUARD: Cologuard: POSITIVE — AB

## 2019-04-05 ENCOUNTER — Encounter: Payer: Self-pay | Admitting: Pulmonary Disease

## 2019-04-07 ENCOUNTER — Encounter: Payer: Self-pay | Admitting: Primary Care

## 2019-04-07 ENCOUNTER — Telehealth: Payer: Self-pay | Admitting: Primary Care

## 2019-04-07 NOTE — Telephone Encounter (Signed)
Please notify patient that her Cologuard test was positive. Recommendations moving forward are for screening colonoscopy. Is she willing to at least meet with GI to discuss?

## 2019-04-08 ENCOUNTER — Telehealth: Payer: Self-pay | Admitting: Pulmonary Disease

## 2019-04-08 ENCOUNTER — Encounter: Payer: Self-pay | Admitting: Internal Medicine

## 2019-04-08 ENCOUNTER — Ambulatory Visit: Payer: Medicare Other | Admitting: Internal Medicine

## 2019-04-08 ENCOUNTER — Other Ambulatory Visit: Payer: Self-pay

## 2019-04-08 VITALS — BP 124/70 | HR 100 | Temp 97.9°F | Ht 62.5 in | Wt 126.4 lb

## 2019-04-08 DIAGNOSIS — J84112 Idiopathic pulmonary fibrosis: Secondary | ICD-10-CM | POA: Diagnosis not present

## 2019-04-08 DIAGNOSIS — R195 Other fecal abnormalities: Secondary | ICD-10-CM

## 2019-04-08 DIAGNOSIS — Z5181 Encounter for therapeutic drug level monitoring: Secondary | ICD-10-CM | POA: Diagnosis not present

## 2019-04-08 DIAGNOSIS — J9611 Chronic respiratory failure with hypoxia: Secondary | ICD-10-CM

## 2019-04-08 LAB — HEPATIC FUNCTION PANEL
ALT: 17 U/L (ref 0–35)
AST: 22 U/L (ref 0–37)
Albumin: 4.2 g/dL (ref 3.5–5.2)
Alkaline Phosphatase: 56 U/L (ref 39–117)
Bilirubin, Direct: 0.1 mg/dL (ref 0.0–0.3)
Total Bilirubin: 0.5 mg/dL (ref 0.2–1.2)
Total Protein: 6.9 g/dL (ref 6.0–8.3)

## 2019-04-08 NOTE — Addendum Note (Signed)
Addended by: Suzzanne Cloud E on: 04/08/2019 11:52 AM   Modules accepted: Orders

## 2019-04-08 NOTE — Telephone Encounter (Signed)
Message left for patient to return my call.

## 2019-04-08 NOTE — Telephone Encounter (Signed)
Spoke with patient. She is aware of results. She stated that she is already on O2 during the day and has a concentrator through [employer]. Advised her that I would go and place the order for documentation in her chart. She verbalized understanding.   Nothing further needed at time of call.

## 2019-04-08 NOTE — Patient Instructions (Addendum)
ICD-10-CM   1. 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 Chronic respiratory failure with hypoxia (HCC)  J96.11   4. Therapeutic drug monitoring  Z51.81    Disease is progressive I think we did the right call by making a change to ofev [place] so far tolerating ofev well  Plan - check LFT 04/08/2019 - meet [facility] to get lighter portable o2 system  - we are still awaiting ONO result - will give a call next week or so with result to see about starting night time o2 - continue ofev per schedule twice daily - refer Roma Kayser - [facility] - due to strng family hx of fibrpsis  followup  - 4-6 weeks do LFT test and visit with Dr [name] - 30 min slot - ILD symptoms and simple walk test on RA at followup

## 2019-04-08 NOTE — Telephone Encounter (Signed)
04/08/2019 1221  Patient's overnight oximetry results have come back.  04/05/2019-overnight oximetry-patient slept for 4 hours and 54 minutes, patient's oxygen levels were below 88% for 4 hours and 1 minute, patient qualified for oxygen, mean oxygen levels were 86%  Please contact the patient let them know that she needs to be started on oxygen 2 L at night.  We will also route these results to Dr. [name] as he recently saw the patient on 04/08/2019 and knew that we are awaiting these results.    Wyn Quaker FNP `

## 2019-04-08 NOTE — Telephone Encounter (Signed)
Aaron Edelman: thanks  [place]: yes pleas start 2L [place] at night

## 2019-04-08 NOTE — Telephone Encounter (Signed)
Patient sent a mychart message to follow up on missed call, I sent patient a message through mychart advising of below and waiting to hear back about her response.

## 2019-04-08 NOTE — Addendum Note (Signed)
Addended by: Hildred Alamin I on: 04/08/2019 11:49 AM   Modules accepted: Orders

## 2019-04-08 NOTE — Progress Notes (Signed)
78 yo female never smoker seen for initial pulmonary consult during hospital for acute respiratory failure   [facility] follow up 01/30/12 -  Pt was admitted 2 weeks ago for chest pain and dyspnea.  Found to have Acute respiratory failure with hypoxia--  Tx for possible diastolic CHF with aggressive diuresis Echo showed EF 60% , no mention of diastolic dysfxn.  HRCT consistent with ILD-  CCM consult with autoimmune workup that was unrevealing with neg ANA, ANCA, RA factor , nml ESR , CRP    Does light housework but not able walk long distances due to dyspnea  FH of pulmonary fibrosis -sibling -they were smokers. NO significant desats with walking in cardiology yesterday, desated to 93% on room air.  No cough or wheezing  No edema.   Since discharge she is feeling better with less DOE. But does get winded with walking long distances.   ROV 02/21/12 -- follows up for ILD noted during hospitalization for resp failure. Treated for possible diastolic CHF. CT scan shows some mild basilar fibrotic change. PFT's 9/3 >> probable mixed disease, no BD response, restricted volumes, decrease DLCO that corrects for Va.  She had family hx of ILD.  She had walking oximetry that she says was reassuring. She feels no better today than at hospital discharge.   ROV 03/24/12 -- ILD without clear etiology, mixed dz on PFT 02/11/12. Initiated a trial prednisone last month >> She doesn't believe that her dyspnea has changed.  Last time she did not desat.   ROV 06/23/12 -- ILD without clear etiology, mixed dz on PFT 02/11/12. Trial of pred >> weaned to off. Sent her to pulm rehab at [facility], feels that it has benefited her. She isn't as SOB as before. CXR today >> stable bibasilar ILD, hiatal hernia.   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 Also possible component diastolic dysfxn. She has a family hx of ILD.  She tells me that her dyspnea is worse compared with last year. She is having some spells of near syncope on exertion. Walked today to exhaustion without desaturation.   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 She has been part of the pirfenidone trial and has been on the medication. She will be transitioning to the commercial drug. Denies any side effects from the medication. I completed financial assistance forms on 04/12/13.  Due for CXR today. Not really doing exercise - she did do pulmonary rehab at [facility].    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 Last CXR 05/19/13, last CT scan 01/24/12. She is still on the pirfenidone trial, receiving study drug. She is fairly stable. Will be converted to the commercial med soon. Tolerates soon.   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 Returns for f/u. She is now on pirfenidone 3 pills tid and is tolerating. She is having more problems with fatigue and dyspnea with exertion - has lay down after eating dinner, with chores. Her synthroid hasn't been adjusted in a long time.  Her last labs were March 3, 15.   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 She is on pirfenidone 3 pills tid. She has been tolerating well from GI standpoint. TSH normal. She has some fatigue, otherwise doing  well. She needs LFT done in 3 months and after that we can go to every 6 months,.   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 On pirfenidone 3 pills tid since August 2014. Last CT scan 5/'15 was stable, described as an NSIP pattern.  LFT were done today, not yet available. She has noticed some slightly raised pale pink macules, not pruritic, since last month. She has  been careful to wear sunscreen.    ROV 12/01/17 --this follow-up visit for patient with a history of idiopathic interstitial lung disease, some coexisting obstruction based on her spirometry.  She also has a hiatal hernia with GERD and chronic cough.  We have managed her on pirfenidone.  At our last visit she noted that she is had some decreased functional capacity, more dyspnea with exertion. She is still having decreased energy, more fatigue with activity. We checked CBC and TSH, both reassuring. She keeps a slight cough, added chlorpheniramine to loratadine. Remains on omeprazole.    She underwent a 6-minute walk test today and was able to walk 462 m without desaturation.  Her last in 04/2016 was 444mRepeat CT chest from 11/28/2017 reviewed and shows stable interstitial changes compared with her most recent from 2017.  There is patchy groundglass attenuation with associated septal thickening and peripheral bronchial ectasis without overt honeycomb change.  No effusion, no suspicious nodules.   ROV 06/18/18 --[name] is 89 followed for history of idiopathic interstitial lung disease and some coexisting obstruction noted on spirometry.  She also has a hiatal hernia with GERD and some associated chronic cough.  We have been managing her on pirfenidone - no side effects.  She is on omeprazole, loratadine, chlorpheniramine.  Since our last visit she was hospitalized for a stroke, has some residual word-finding trouble. She is scheduled for nerve conduction studies after some falls and leg weakness. She is also being evaluated for possible thyroid surgery with Dr. GHarlow Asa She had a CT chest done 01/12/18 that I reviewed, shows no real change in NSIP pattern, large HH, no new infiltrates. Overall reports that breathing is stable, not limiting.   ROV 12/03/2018 --pleasant 78year old woman with mixed lung disease.  She has mild obstruction and restriction in the setting of a hiatal hernia and idiopathic  interstitial lung disease that would characterizes IPF.  We have been managing her on pirfenidone.  Also with a history of GERD on Prilosec, allergic rhinitis on loratadine. Last LFT in 05/2018 were normal. Last Ct chest was 01/2018. She is having progressive exertional SOB compared with last time. She has to rest doing housework, taking out the trash. She has to stop to rest. Not on BD's, didn't benefit. She had thyroid sgy in 07/2018, just had her synthroid increased in 07/2018. TSH was 3.05 5/26.     OV 02/25/2019  Subjective:  Patient ID: NRutherford Calderon female , DOB: 723-Mar-1942, age 89y.o. , MRN: [id number], [id number] [id number]  02/25/2019 -   Chief Complaint  Patient presents with  . IPF (idiopathic pulmonary fibrosis)    Had 6 minute walk yesterday.     Clinical IPF patient  - On Esbreit since aug 2014 initially via  expanded access program   - Autoimmune panel 2013 and 2014 is negative and normal   - Pulmonary function test 02/15/2014 postbronchodilator FVC 1.55 L/  59%, total lung capacity 3.05 L/64%, DLCO 14.3/66%  - CT  chest  May 2015 - NSIP per radioolgy  - Trial participagnt early 08-01-2014 for 6 months IPF - Phase 4 study - got esbriet and 6 months of add-on ofev   HPI Dyamon Sosinski 78 y.o. -has now been referred to the ILD clinic.  This is because she has had progression in her symptoms.  She says for the last few months she has had progressive shortness of breath but no worsening cough or pedal edema or hemoptysis or proximal nocturnal dyspnea.  She is tolerating the Esbriet well since August 2014 when she was in the expanded access program.  Overall she is stable and she is active in the pulmonary fibrosis foundation support group but in the last few months is progressive dyspnea.  Nurse practitioner did a high-resolution CT scan of the chest and it shows progression although the pattern is still indeterminate for UIP.  In addition  6-minute walk test last week she had new onset desaturation and has been started on portable oxygen which is helping.  However she does not have nighttime oxygen.  There is a spirometry that is upcoming.  Review of the spirometry is between 2013/08/01 and 08/02/15 show clear progression in both FVC and DLCO.  Previously serology was negative in 20 13/2014 but she says she might have Raynaud Echocardiogram November 2019 with normal ejection fraction and grade 1 diastolic dysfunction   Integrated Comprehensive ILD Questionnaire  Symptoms: -ILD diagnosed in 20 14/2015.  Which is gradually worse in the last several months.  She is having level for shortness of breath the household work shopping and walking on level ground at her own pace.  She does not think any other condition is limiting her.  No cough.  No wheezing   Past Medical History : Negative for collagen vascular disease acid reflux but positive for previous stroke in November 2019 and thyroid disease.  Detailed past medical history is negative otherwise   ROS: Positive for fatigue   FAMILY HISTORY of LUNG DISEASE: Her sister died in 78 from pulmonary fibrosis and a brother died in 08/01/04 from pulmonary fibrosis   EXPOSURE HISTORY: No smoking no cigars.  No vaping no marijuana.  No cocaine.  No intravenous drug use.   HOME and HOBBY DETAILS : Lives in an apartment.  No mold exposure no mildew exposure.  Extensive home exposure history is negative.   OCCUPATIONAL HISTORY (122 questions) : Positive for gardening but otherwise negative.   PULMONARY TOXICITY HISTORY (27 items): Intermittent prednisone burst otherwise negative.     Results for SKI, POLICH (MRN [id number]) as of 02/25/2019 11:43  Ref. Range 02/15/2014 11:51 07/15/2014 11:47 01/03/2015 09:40 03/17/2015 13:03 10/23/2015 08:45  FVC-Pre Latest Units: L 1.58 1.59 1.52 1.47 1.44   Results for KAURI, GARSON (MRN [id number]) as of 02/25/2019 11:43  Ref. Range 02/15/2014  11:51 07/15/2014 11:47 01/03/2015 09:40 03/17/2015 13:03 10/23/2015 08:45  DLCO unc Latest Units: ml/min/mmHg 14.30 13.65 13.66 12.11 12.65  DLCO unc % pred Latest Units: % 66 63 63 56 58    CT chest high resolution Aug 2020 -personally visualized  standard protocol without intravenous contrast. High resolution imaging of the lungs, as well as inspiratory and expiratory imaging, was performed.  COMPARISON:  11/28/2017, 10/09/2015, 01/24/2012  FINDINGS: Cardiovascular: Aortic atherosclerosis. Cardiomegaly. Left coronary artery calcifications. No pericardial effusion.  Mediastinum/Nodes: No enlarged mediastinal, hilar, or axillary lymph nodes. Large hiatal hernia with intrathoracic position of the gastric body and fundus. Thyroid gland, trachea, and esophagus demonstrate  no significant findings.  Lungs/Pleura: Redemonstrated moderate pulmonary fibrosis in a pattern with an apical to basal gradient, featuring peripheral interstitial opacity and some suggestion of nodularity, mild traction bronchiectasis, and occasional areas of subpleural bronchiolectasis in the bases. There is no evidence of honeycombing. Moderate lobular air trapping on expiratory phase imaging. Findings are not significantly changed when compared to immediate prior examination dated 11/28/2017, however are slightly worsened over time on comparison to multiple prior examinations dating back to at least 01/24/2012. No pleural effusion or pneumothorax.  Upper Abdomen: No acute abnormality.  Musculoskeletal: No chest wall mass or suspicious bone lesions identified.  IMPRESSION: 1. Redemonstrated moderate pulmonary fibrosis in a pattern with an apical to basal gradient, featuring peripheral interstitial opacity and some suggestion of nodularity, mild traction bronchiectasis, and occasional areas of subpleural bronchiolectasis in the bases. There is no evidence of honeycombing. Moderate lobular air trapping on  expiratory phase imaging. Findings are not significantly changed when compared to immediate prior examination dated 11/28/2017, however are slightly worsened over time on comparison to multiple prior examinations dating back to at least 01/24/2012. Findings remain indeterminate for UIP and differential considerations include both UIP and chronic fibrosing NSIP.  2.  Coronary artery disease and aortic atherosclerosis.  3.  Hiatal hernia.   Electronically Signed   By: Eddie Candle M.D.   On: 01/15/2019 12:09  ROS - per HPI Recent Labs  Lab 02/23/19 1153  AST 20  ALT 13  ALKPHOS 47  BILITOT 0.3  PROT 6.8  ALBUMIN 4.1     OV 04/08/2019  Subjective:  Patient ID: Tina Calderon, female , DOB: [birth year] , age 66 y.o. , MRN: [id number] , ADDRESS: [id number] [place] [address]   04/08/2019 -   Chief Complaint  Patient presents with  . Follow-up    Patient reports sob with exertion and a mild cough.     Clinical IPF patient    - strong family hx of ILD (brother died age 84, sister died in her 26s)  - Autoimmune panel 2013 and 2014 is negative and normal   - Pulmonary function test 02/15/2014 postbronchodilator FVC 1.55 L/  59%, total lung capacity 3.05 L/64%, DLCO 14.3/66%  - CT chest  May 2015 - NSIP per radioolgy  - Rx  - - On Esbreit since aug 2014 initially via  expanded access program (Trial participagnt early 2016 for 6 months IPF - Phase 4 study - got esbriet and 6 months of add-on ofev)  - Switched to ofev due to progessive disease mid-oct 2020 (03/24/2019) - started on portable o2 sept 2020   HPI Illona Bulman [place] 78 y.o. - returns for followup following starting ofev 2 weeks ago. Therapy was changed due to progressive disease. She is tolerating nintedanib fine.  The couple of episodes of mild diarrhea that she had.  One time she had vomiting.  This is following a a family gathering.  Other than that she is tolerating nintedanib just  fine.  In terms of her ILD even though it is progressed compared to last visit in September she feels she is stable.  She is having significant cough.  She says vitamin C helps this.  She is using portable oxygen as needed.  She wants a lighter tank.  She has had overnight oxygen study with the results are still pending this was done a few days ago.  She reminded me about the strong family history of ILD that she has.  Her  brother died at age 590 7 years ago her sister died in her 75s also from ILD.  She is interested in seeing the Retail buyer.  I have sent email to genetics counselor Roma Kayser within our health system.    SYMPTOM SCALE - ILD 04/08/2019   O2 use Portable o2 as needed  Shortness of Breath 0 -> 5 scale with 5 being worst (score 6 If unable to do)  At rest 1  Simple tasks - showers, clothes change, eating, shaving 1  Household (dishes, doing bed, laundry) 1  Shopping 1  Walking level at own pace 1  Walking keeping up with others of same age 59  Walking up Stairs 6  Walking up [place] 6  Total (40 - 48) Dyspnea Score 19  How bad is your cough? 1  How bad is your fatigue 2       Simple office walk 185 feet x  3 laps goal with forehead probe 04/08/2019   O2 used RA  Number laps completed 3    Comments about pace - stopped at 2  Resting Pulse Ox/HR 96% and 104/min  Final Pulse Ox/HR 82% and 120/min  Desaturated </= 88% yes  Desaturated <= 3% points yes  Got Tachycardic >/= 90/min yes  Symptoms at end of test Very dyspneice  Miscellaneous comments Maris Berger mid oct 2020       Results for SHEYLIN, SCHARNHORST [facility] (MRN [id number]) as of 04/08/2019 11:19  Ref. Range 07/15/2014 11:47 01/03/2015 09:40 03/17/2015 13:03 10/23/2015 08:45 03/12/2019 16:47 - startr ofev due to progerssiv diseae  FVC-Pre Latest Units: L 1.59 1.52 1.47 1.44 1.08  FVC-%Pred-Pre Latest Units: % 61 59 57 56 44  Results for APRILE, DICKENSON (MRN [id number]) as of 04/08/2019 11:19  Ref. Range  02/15/2014 11:51 07/15/2014 11:47 01/03/2015 09:40 03/17/2015 13:03 10/23/2015 08:45 03/12/2019 16:47  DLCO unc Latest Units: ml/min/mmHg 14.30 13.65 13.66 12.11 12.65 8.71  DLCO unc % pred Latest Units: % 66 63 63 56 58 49   ROS - per HPI     has a past medical history of Anginal pain ([place]), Cancer ([place]), CHF (congestive heart failure) ([place]) (01/22/2012), Chronic fatigue (07/15/2014), CVA (cerebral vascular accident) ([place]) (04/18/2018), GERD (gastroesophageal reflux disease) (05/12/2017), Hypertension, Hypothyroidism, ILD (interstitial lung disease) ([place]) (02/04/2012), IPF (idiopathic pulmonary fibrosis) ([place]) (2013), Mild intermittent asthma (01/29/2016), Pelvic relaxation, Prolapse of bladder, Shortness of breath, and Thyroid disease.   reports that she has never smoked. She has never used smokeless tobacco.  Past Surgical History:  Procedure Laterality Date  . ANTERIOR AND POSTERIOR REPAIR  12/05/2011   Procedure: ANTERIOR (CYSTOCELE) AND POSTERIOR REPAIR (RECTOCELE);  Surgeon: [name], MD;  Location: [facility];  Service: Gynecology;  Laterality: N/A;  with cysto  . Bladder tact  15 years ago  . LEFT HEART CATHETERIZATION WITH CORONARY ANGIOGRAM N/A 01/22/2012   Procedure: LEFT HEART CATHETERIZATION WITH CORONARY ANGIOGRAM;  Surgeon: [name], MD;  Location: [facility];  Service: Cardiovascular;  Laterality: N/A;  . NECK SURGERY  2010  . SHOULDER SURGERY    . thyroid disease    . THYROID LOBECTOMY Left 07/17/2018   Procedure: LEFT THYROID LOBECTOMY;  Surgeon: [name], MD;  Location: [facility];  Service: General;  Laterality: Left;  . TONSILLECTOMY    . VAGINAL HYSTERECTOMY  15 years ago    Allergies  Allergen Reactions  . Dilaudid [Hydromorphone] Nausea And Vomiting  . Crestor [Rosuvastatin Calcium] Other (See Comments)    Myalgias,  muscle weakness    Immunization History  Administered Date(s) Administered  . Influenza Split 02/22/2012, 03/01/2013, 02/08/2014, 01/27/2015  .  Influenza Whole 04/11/2011, 02/10/2017  . Influenza, High Dose Seasonal PF 02/16/2017, 01/20/2019  . Influenza,inj,Quad PF,6+ Mos 03/11/2016  . Influenza-Unspecified 01/26/2018  . Pneumococcal Conjugate-13 03/29/2014  . Pneumococcal Polysaccharide-23 12/08/2017  . Pneumococcal-Unspecified 04/11/2011, 05/13/2016  . Td 08/30/2008  . Tdap 11/16/2017  . Zoster 08/07/2011  . Zoster Recombinat (Shingrix) 09/20/2016, 02/19/2017    Family History  Problem Relation Age of Onset  . Thyroid disease Mother   . Hypertension Mother   . Coronary artery disease Mother   . Dementia Mother   . Stroke Mother   . Aneurysm Father   . Pulmonary fibrosis Sister   . Stroke Other   . Sleep apnea Other   . Cancer Brother        LIVER  . Pulmonary fibrosis Brother      Current Outpatient Medications:  .  acetaminophen (TYLENOL) 500 MG tablet, Take 1,000 mg by mouth every 6 (six) hours as needed for mild pain or headache. , Disp: , Rfl:  .  amLODipine (NORVASC) 5 MG tablet, TAKE 1 TABLET BY MOUTH EVERY DAY FOR BLOOD PRESSURE, Disp: 90 tablet, Rfl: 1 .  atorvastatin (LIPITOR) 40 MG tablet, Take 1 tablet (40 mg total) by mouth at bedtime. For cholesterol., Disp: 90 tablet, Rfl: 3 .  CALCIUM CARBONATE-VITAMIN D PO, Take 1 tablet by mouth daily. , Disp: , Rfl:  .  clopidogrel (PLAVIX) 75 MG tablet, Take 1 tablet (75 mg total) by mouth daily. For stroke prevention., Disp: 90 tablet, Rfl: 3 .  CRANBERRY PO, Take 1 capsule by mouth every Monday, Wednesday, and Friday. , Disp: , Rfl:  .  ferrous sulfate 325 (65 FE) MG tablet, Take 325 mg by mouth daily., Disp: , Rfl:  .  levothyroxine (SYNTHROID) 100 MCG tablet, Take 1 tablet by mouth every morning on an empty stomach. No food or other medications for 30 minutes., Disp: 90 tablet, Rfl: 1 .  loratadine (CLARITIN) 10 MG tablet, Take 10 mg by mouth daily. , Disp: , Rfl:  .  metoprolol succinate (TOPROL-XL) 25 MG 24 hr tablet, Take 1 tablet (25 mg total) by mouth  daily., Disp: 30 tablet, Rfl: 11 .  Multiple Vitamins-Minerals (CENTRUM SILVER PO), Take 1 tablet by mouth daily., Disp: , Rfl:  .  OFEV 150 MG CAPS, 150 mg. As Directed, Disp: , Rfl:  .  omeprazole (PRILOSEC) 20 MG capsule, TAKE 1 CAPSULE BY MOUTH EVERY DAY, Disp: 90 capsule, Rfl: 1      Objective:   Vitals:   04/08/19 1107  BP: 124/70  Pulse: 100  Temp: 97.9 F (36.6 C)  TempSrc: Temporal  SpO2: 98%  Weight: 126 lb 6.4 oz (57.3 kg)  Height: 5' 2.5" (1.588 m)    Estimated body mass index is 22.75 kg/m as calculated from the following:   Height as of this encounter: 5' 2.5" (1.588 m).   Weight as of this encounter: 126 lb 6.4 oz (57.3 kg).  _0 @  Filed Weights   04/08/19 1107  Weight: 126 lb 6.4 oz (57.3 kg)     Physical Exam  General Appearance:    Alert, cooperative, no distress, appears stated age - yes , Deconditioned looking - no , OBESE  - no, Sitting on Wheelchair -  no  Head:    Normocephalic, without obvious abnormality, atraumatic  Eyes:    PERRL, conjunctiva/corneas clear,  Ears:    Normal TM's and external ear canals, both ears  Nose:   Nares normal, septum midline, mucosa normal, no drainage    or sinus tenderness. OXYGEN ON  - no . Patient is @ ra   Throat:   Lips, mucosa, and tongue normal; teeth and gums normal. Cyanosis on lips - no  Neck:   Supple, symmetrical, trachea midline, no adenopathy;    thyroid:  no enlargement/tenderness/nodules; no carotid   bruit or JVD  Back:     Symmetric, no curvature, ROM normal, no CVA tenderness  Lungs:     Distress - no , Wheeze no, Barrell Chest - no, Purse lip breathing - no, Crackles - yes   Chest Wall:    No tenderness or deformity.    Heart:    Regular rate and rhythm, S1 and S2 normal, no rub   or gallop, Murmur - no  Breast Exam:    NOT DONE  Abdomen:     Soft, non-tender, bowel sounds active all four quadrants,    no masses, no organomegaly. Visceral obesity - no  Genitalia:   NOT DONE   Rectal:   NOT DONE  Extremities:   Extremities - normal, Has Cane - no, Clubbing - no, Edema - no  Pulses:   2+ and symmetric all extremities  Skin:   Stigmata of Connective Tissue Disease - no  Lymph nodes:   Cervical, supraclavicular, and axillary nodes normal  Psychiatric:  Neurologic:   Pleasant - yes, Anxious - no, Flat affect - no  CAm-ICU - neg, Alert and Oriented x 3 - yes, Moves all 4s - yes, Speech - normal, Cognition - intact           Assessment:       ICD-10-CM   1. 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 Chronic respiratory failure with hypoxia (HCC)  J96.11   4. Therapeutic drug monitoring  Z51.81        Plan:     Patient Instructions     ICD-10-CM   1. 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 Chronic respiratory failure with hypoxia (HCC)  J96.11   4. Therapeutic drug monitoring  Z51.81    Disease is progressive I think we did the right call by making a change to ofev [place] so far tolerating ofev well  Plan - check LFT 04/08/2019 - meet [facility] to get lighter portable o2 system  - we are still awaiting ONO result - will give a call next week or so with result to see about starting night time o2 - continue ofev per schedule twice daily - refer Roma Kayser - [facility] genetics - due to strng family hx of fibrpsis  followup  - 4-6 weeks do LFT test and visit with Dr [name] - 30 min slot - ILD symptoms and simple walk test on RA at followup  > 50% of this > 25 min visit spent in face to face counseling or coordination of care - by this undersigned MD - Dr Brand Males. This includes one or more of the following documented above: discussion of test results, diagnostic or treatment recommendations, prognosis, risks and benefits of management options, instructions, education, compliance or risk-factor reduction     SIGNATURE    Dr. Brand Males, M.D., F.C.C.P,  Pulmonary and Critical Care Medicine Staff Physician,  [place] Director - Interstitial Lung Disease  Program  Pulmonary [place] at [place], [place], [id number]  Pager: [phone], If no answer or between  15:00h - 7:00h: call [phone] Telephone: [phone]  11:42 AM 04/08/2019

## 2019-04-09 ENCOUNTER — Other Ambulatory Visit: Payer: Self-pay

## 2019-04-09 ENCOUNTER — Telehealth: Payer: Self-pay

## 2019-04-09 DIAGNOSIS — R195 Other fecal abnormalities: Secondary | ICD-10-CM

## 2019-04-09 MED ORDER — NA SULFATE-K SULFATE-MG SULF 17.5-3.13-1.6 GM/177ML PO SOLN: 1.0000 | Freq: Once | ORAL | 0 refills | Status: AC

## 2019-04-09 NOTE — Telephone Encounter (Signed)
Gastroenterology Pre-Procedure Review  Request Date: Tuesday 04/20/19 Requesting Physician: Dr. Vicente Males  PATIENT REVIEW QUESTIONS: The patient responded to the following health history questions as indicated:    1. Are you having any GI issues? no Positive Cologard 2. Do you have a personal history of Polyps? no 3. Do you have a family history of Colon Cancer or Polyps? no 4. Diabetes Mellitus? no 5. Joint replacements in the past 12 months?no 6. Major health problems in the past 3 months?no 7. Any artificial heart valves, MVP, or defibrillator?no    MEDICATIONS & ALLERGIES:    Patient reports the following regarding taking any anticoagulation/antiplatelet therapy:   Plavix, Coumadin, Eliquis, Xarelto, Lovenox, Pradaxa, Brilinta, or Effient? yes (Plavix prescribed by Alma Friendly Blood Thinner Sent) Aspirin? no  Patient confirms/reports the following medications:  Current Outpatient Medications  Medication Sig Dispense Refill  . acetaminophen (TYLENOL) 500 MG tablet Take 1,000 mg by mouth every 6 (six) hours as needed for mild pain or headache.     [name] amLODipine (NORVASC) 5 MG tablet TAKE 1 TABLET BY MOUTH EVERY DAY FOR BLOOD PRESSURE 90 tablet 1  . atorvastatin (LIPITOR) 40 MG tablet Take 1 tablet (40 mg total) by mouth at bedtime. For cholesterol. 90 tablet 3  . CALCIUM CARBONATE-VITAMIN D PO Take 1 tablet by mouth daily.     . clopidogrel (PLAVIX) 75 MG tablet Take 1 tablet (75 mg total) by mouth daily. For stroke prevention. 90 tablet 3  . CRANBERRY PO Take 1 capsule by mouth every Monday, Wednesday, and Friday.     . ferrous sulfate 325 (65 FE) MG tablet Take 325 mg by mouth daily.    [name] levothyroxine (SYNTHROID) 100 MCG tablet Take 1 tablet by mouth every morning on an empty stomach. No food or other medications for 30 minutes. 90 tablet 1  . loratadine (CLARITIN) 10 MG tablet Take 10 mg by mouth daily.     . metoprolol succinate (TOPROL-XL) 25 MG 24 hr tablet Take 1 tablet (25 mg  total) by mouth daily. 30 tablet 11  . Multiple Vitamins-Minerals (CENTRUM SILVER PO) Take 1 tablet by mouth daily.    [name] OFEV 150 MG CAPS 150 mg. As Directed    . omeprazole (PRILOSEC) 20 MG capsule TAKE 1 CAPSULE BY MOUTH EVERY DAY 90 capsule 1   No current facility-administered medications for this visit.     Patient confirms/reports the following allergies:  Allergies  Allergen Reactions  . Dilaudid [Hydromorphone] Nausea And Vomiting  . Crestor [Rosuvastatin Calcium] Other (See Comments)    Myalgias, muscle weakness    No orders of the defined types were placed in this encounter.   AUTHORIZATION INFORMATION Primary Insurance: 1D#: Group #:  Secondary Insurance: 1D#: Group #:  SCHEDULE INFORMATION: Date: 04/20/19 Time: Location:[facility]

## 2019-04-12 NOTE — Telephone Encounter (Signed)
Noted. The message from [place] have been forward to Allie Bossier

## 2019-04-13 ENCOUNTER — Telehealth: Payer: Self-pay | Admitting: Licensed Clinical Social Worker

## 2019-04-13 NOTE — Telephone Encounter (Signed)
Confirmed 12/3 genetics appointment with patient. December per patient.

## 2019-04-15 ENCOUNTER — Telehealth: Payer: Self-pay

## 2019-04-15 NOTE — Telephone Encounter (Signed)
Patient was advised on 04/14/19 per NP Alma Friendly, to stop Plavix 5 days prior to her colonoscopy (04/20/19) restart blood thinner ASAP after as long as hemodynamically stable.  Thanks [employer]

## 2019-04-16 ENCOUNTER — Other Ambulatory Visit
Admission: RE | Admit: 2019-04-16 | Discharge: 2019-04-16 | Disposition: A | Discharge status: Home / Self Care | Payer: Medicare Other | Source: Ambulatory Visit | Attending: Gastroenterology | Admitting: Gastroenterology

## 2019-04-16 ENCOUNTER — Other Ambulatory Visit: Payer: Self-pay

## 2019-04-16 DIAGNOSIS — Z20828 Contact with and (suspected) exposure to other viral communicable diseases: Secondary | ICD-10-CM | POA: Diagnosis not present

## 2019-04-16 DIAGNOSIS — Z01812 Encounter for preprocedural laboratory examination: Secondary | ICD-10-CM | POA: Diagnosis present

## 2019-04-16 LAB — SARS CORONAVIRUS 2 (TAT 6-24 HRS): SARS Coronavirus 2: NEGATIVE

## 2019-04-19 ENCOUNTER — Encounter: Payer: Self-pay | Admitting: *Deleted

## 2019-04-20 ENCOUNTER — Ambulatory Visit: Payer: Medicare Other | Admitting: Anesthesiology

## 2019-04-20 ENCOUNTER — Other Ambulatory Visit: Payer: Self-pay

## 2019-04-20 ENCOUNTER — Ambulatory Visit
Admission: RE | Admit: 2019-04-20 | Discharge: 2019-04-20 | Disposition: A | Discharge status: Home / Self Care | Payer: Medicare Other | Attending: Gastroenterology | Admitting: Gastroenterology

## 2019-04-20 ENCOUNTER — Encounter: Payer: Self-pay | Admitting: Anesthesiology

## 2019-04-20 ENCOUNTER — Encounter
Admission: RE | Disposition: A | Discharge status: Home / Self Care | Payer: Self-pay | Source: Home / Self Care | Attending: Gastroenterology

## 2019-04-20 DIAGNOSIS — I509 Heart failure, unspecified: Secondary | ICD-10-CM | POA: Insufficient documentation

## 2019-04-20 DIAGNOSIS — R195 Other fecal abnormalities: Secondary | ICD-10-CM | POA: Diagnosis present

## 2019-04-20 DIAGNOSIS — D125 Benign neoplasm of sigmoid colon: Secondary | ICD-10-CM | POA: Insufficient documentation

## 2019-04-20 DIAGNOSIS — K5730 Diverticulosis of large intestine without perforation or abscess without bleeding: Secondary | ICD-10-CM | POA: Insufficient documentation

## 2019-04-20 DIAGNOSIS — K635 Polyp of colon: Secondary | ICD-10-CM | POA: Diagnosis not present

## 2019-04-20 DIAGNOSIS — K219 Gastro-esophageal reflux disease without esophagitis: Secondary | ICD-10-CM | POA: Diagnosis not present

## 2019-04-20 DIAGNOSIS — I110 Hypertensive heart disease with heart failure: Secondary | ICD-10-CM | POA: Diagnosis not present

## 2019-04-20 DIAGNOSIS — Z79899 Other long term (current) drug therapy: Secondary | ICD-10-CM | POA: Insufficient documentation

## 2019-04-20 DIAGNOSIS — E039 Hypothyroidism, unspecified: Secondary | ICD-10-CM | POA: Insufficient documentation

## 2019-04-20 DIAGNOSIS — Z7902 Long term (current) use of antithrombotics/antiplatelets: Secondary | ICD-10-CM | POA: Diagnosis not present

## 2019-04-20 DIAGNOSIS — K640 First degree hemorrhoids: Secondary | ICD-10-CM | POA: Insufficient documentation

## 2019-04-20 DIAGNOSIS — D120 Benign neoplasm of cecum: Secondary | ICD-10-CM | POA: Insufficient documentation

## 2019-04-20 DIAGNOSIS — Z8673 Personal history of transient ischemic attack (TIA), and cerebral infarction without residual deficits: Secondary | ICD-10-CM | POA: Diagnosis not present

## 2019-04-20 DIAGNOSIS — Z79890 Hormone replacement therapy (postmenopausal): Secondary | ICD-10-CM | POA: Diagnosis not present

## 2019-04-20 HISTORY — PX: COLONOSCOPY WITH PROPOFOL: SHX5780

## 2019-04-20 SURGERY — COLONOSCOPY WITH PROPOFOL
Anesthesia: General

## 2019-04-20 MED ORDER — SODIUM CHLORIDE 0.9 % IV SOLN
INTRAVENOUS | Status: DC
  Administered 2019-04-20: 09:00:00 via INTRAVENOUS

## 2019-04-20 MED ORDER — PROPOFOL 500 MG/50ML IV EMUL
INTRAVENOUS | Status: DC | PRN
  Administered 2019-04-20: 100 ug/kg/min via INTRAVENOUS

## 2019-04-20 MED ORDER — MIDAZOLAM HCL 2 MG/2ML IJ SOLN
INTRAMUSCULAR | Status: DC | PRN
  Administered 2019-04-20: 1 mg via INTRAVENOUS

## 2019-04-20 MED ORDER — FENTANYL CITRATE (PF) 100 MCG/2ML IJ SOLN
INTRAMUSCULAR | Status: AC
  Filled 2019-04-20: qty 2

## 2019-04-20 MED ORDER — PROPOFOL 500 MG/50ML IV EMUL
INTRAVENOUS | Status: AC
  Filled 2019-04-20: qty 50

## 2019-04-20 MED ORDER — MIDAZOLAM HCL 2 MG/2ML IJ SOLN
INTRAMUSCULAR | Status: AC
  Filled 2019-04-20: qty 2

## 2019-04-20 MED ORDER — FENTANYL CITRATE (PF) 100 MCG/2ML IJ SOLN
INTRAMUSCULAR | Status: DC | PRN
  Administered 2019-04-20: 50 ug via INTRAVENOUS

## 2019-04-20 NOTE — H&P (Signed)
Tina Bellows, MD [address], [place], [place], [place], [address] [address], [place], [place], [place], [address] Phone: [phone]  Fax: [phone]  Primary Care Physician:  [name], NP   Pre-Procedure History & Physical: HPI:  Tina Calderon is a 78 y.o. female is here for an colonoscopy.   Past Medical History:  Diagnosis Date  . Anginal pain ([place])   . Cancer ([place])   . CHF (congestive heart failure) ([place]) 01/22/2012  . Chronic fatigue 07/15/2014  . CVA (cerebral vascular accident) ([place]) 04/18/2018  . GERD (gastroesophageal reflux disease) 05/12/2017  . Hypertension   . Hypothyroidism   . ILD (interstitial lung disease) ([place]) 02/04/2012  . IPF (idiopathic pulmonary fibrosis) ([place]) 2013  . Mild intermittent asthma 01/29/2016  . Pelvic relaxation   . Prolapse of bladder   . Shortness of breath   . Thyroid disease     Past Surgical History:  Procedure Laterality Date  . ANTERIOR AND POSTERIOR REPAIR  12/05/2011   Procedure: ANTERIOR (CYSTOCELE) AND POSTERIOR REPAIR (RECTOCELE);  Surgeon: [name], MD;  Location: [facility];  Service: Gynecology;  Laterality: N/A;  with cysto  . Bladder tact  15 years ago  . LEFT HEART CATHETERIZATION WITH CORONARY ANGIOGRAM N/A 01/22/2012   Procedure: LEFT HEART CATHETERIZATION WITH CORONARY ANGIOGRAM;  Surgeon: [name], MD;  Location: [facility] CATH [facility];  Service: Cardiovascular;  Laterality: N/A;  . NECK SURGERY  2010  . SHOULDER SURGERY    . thyroid disease    . THYROID LOBECTOMY Left 07/17/2018   Procedure: LEFT THYROID LOBECTOMY;  Surgeon: [name], MD;  Location: [facility];  Service: General;  Laterality: Left;  . TONSILLECTOMY    . VAGINAL HYSTERECTOMY  15 years ago    Prior to Admission medications   Medication Sig Start Date End Date Taking? Authorizing Provider  acetaminophen (TYLENOL) 500 MG tablet Take 1,000 mg by mouth every 6 (six) hours as needed for mild pain or headache.     [provider]  amLODipine (NORVASC) 5 MG tablet TAKE 1 TABLET BY MOUTH EVERY DAY FOR BLOOD PRESSURE 11/25/18   [name], NP  atorvastatin (LIPITOR) 40 MG tablet Take 1 tablet (40 mg total) by mouth at bedtime. For cholesterol. 05/13/18   [name], NP  CALCIUM CARBONATE-VITAMIN D PO Take 1 tablet by mouth daily.     [provider]  clopidogrel (PLAVIX) 75 MG tablet Take 1 tablet (75 mg total) by mouth daily. For stroke prevention. 05/13/18   [name], NP  CRANBERRY PO Take 1 capsule by mouth every Monday, Wednesday, and Friday.     [provider]  ferrous sulfate 325 (65 FE) MG tablet Take 325 mg by mouth daily.    [provider]  levothyroxine (SYNTHROID) 100 MCG tablet Take 1 tablet by mouth every morning on an empty stomach. No food or other medications for 30 minutes. 11/04/18   [name], NP  loratadine (CLARITIN) 10 MG tablet Take 10 mg by mouth daily.     [provider]  metoprolol succinate (TOPROL-XL) 25 MG 24 hr tablet Take 1 tablet (25 mg total) by mouth daily. 12/31/18   Buford Dresser, MD  Multiple Vitamins-Minerals (CENTRUM SILVER PO) Take 1 tablet by mouth daily.    [provider]  OFEV 150 MG CAPS 150 mg. As Directed 03/16/19   [provider]  omeprazole (PRILOSEC) 20 MG capsule TAKE 1 CAPSULE  BY MOUTH EVERY DAY 01/19/19   Collene Gobble, MD    Allergies as of 04/09/2019 - Review Complete 04/08/2019  Allergen Reaction Noted  . Dilaudid [hydromorphone] Nausea And Vomiting 01/21/2012  . Crestor [rosuvastatin calcium] Other (See Comments) 02/20/2018    Family History  Problem Relation Age of Onset  . Thyroid disease Mother   . Hypertension Mother   . Coronary artery disease Mother   . Dementia Mother   . Stroke Mother   . Aneurysm Father   . Pulmonary fibrosis Sister   . Stroke Other   . Sleep apnea Other   . Cancer Brother        LIVER  . Pulmonary fibrosis Brother      Social History   Socioeconomic History  . Marital status: Married    Spouse name: Not on file  . Number of children: Not on file  . Years of education: Not on file  . Highest education level: Not on file  Occupational History  . Not on file  Social Needs  . Financial resource strain: Not on file  . Food insecurity    Worry: Not on file    Inability: Not on file  . Transportation needs    Medical: Not on file    Non-medical: Not on file  Tobacco Use  . Smoking status: Never Smoker  . Smokeless tobacco: Never Used  Substance and Sexual Activity  . Alcohol use: No  . Drug use: No  . Sexual activity: Never    Birth control/protection: Post-menopausal, Surgical    Comment: HYST  Lifestyle  . Physical activity    Days per week: Not on file    Minutes per session: Not on file  . Stress: Not on file  Relationships  . Social Herbalist on phone: Not on file    Gets together: Not on file    Attends religious service: Not on file    Active member of club or organization: Not on file    Attends meetings of clubs or organizations: Not on file    Relationship status: Not on file  . Intimate partner violence    Fear of current or ex partner: Not on file    Emotionally abused: Not on file    Physically abused: Not on file    Forced sexual activity: Not on file  Other Topics Concern  . Not on file  Social History Narrative   Married.   2 children, 5 grandchildren.   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   Enjoys puzzles, sewing, traveling, reading.     Review of Systems: See HPI, otherwise negative ROS  Physical Exam: BP (!) 168/109   Pulse 97   Temp 97.7 F (36.5 C) (Oral)   Resp 19   Ht 5' 2.5" (1.588 m)   Wt 57.6 kg   SpO2 95%   BMI 22.86 kg/m  General:   Alert,  pleasant and cooperative in NAD Head:  Normocephalic and atraumatic. Neck:  Supple; no masses or thyromegaly. Lungs:  Clear throughout to auscultation, normal respiratory effort.    Heart:   +S1, +S2, Regular rate and rhythm, No edema. Abdomen:  Soft, nontender and nondistended. Normal bowel sounds, without guarding, and without rebound.   Neurologic:  Alert and  oriented x4;  grossly normal neurologically.  Impression/Plan: Tina Calderon is here for an colonoscopy to be performed for positive cologuard.Risks, benefits, limitations, and alternatives regarding  colonoscopy have been reviewed with the  patient.  Questions have been answered.  All parties agreeable.   Tina Bellows, MD  04/20/2019, 9:02 AM

## 2019-04-20 NOTE — Transfer of Care (Signed)
Immediate Anesthesia Transfer of Care Note  Patient: Gila Lauf  Procedure(s) Performed: COLONOSCOPY WITH PROPOFOL (N/A )  Patient Location: PACU  Anesthesia Type:General  Level of Consciousness: awake and sedated  Airway & Oxygen Therapy: Patient Spontanous Breathing and Patient connected to nasal cannula oxygen  Post-op Assessment: Report given to RN and Post -op Vital signs reviewed and stable  Post vital signs: Reviewed and stable  Last Vitals:  Vitals Value Taken Time  BP 97/49 04/20/19 0938  Temp 36.3 C 04/20/19 0938  Pulse 68 04/20/19 0945  Resp 20 04/20/19 0945  SpO2 98 % 04/20/19 0945  Vitals shown include unvalidated device data.  Last Pain:  Vitals:   04/20/19 0938  TempSrc: Temporal  PainSc: 0-No pain         Complications: No apparent anesthesia complications

## 2019-04-20 NOTE — Anesthesia Procedure Notes (Signed)
Performed by: Vaughan Sine Pre-anesthesia Checklist: Patient identified, Emergency Drugs available, Suction available, Patient being monitored and Timeout performed Patient Re-evaluated:Patient Re-evaluated prior to induction Oxygen Delivery Method: Nasal cannula Preoxygenation: Pre-oxygenation with 100% oxygen Induction Type: IV induction Placement Confirmation: positive ETCO2 and CO2 detector

## 2019-04-20 NOTE — Op Note (Signed)
[facility] Patient Name: Tina Calderon Procedure Date: 04/20/2019 9:05 AM MRN: [id number] Account #: [id number] Date of Birth: 1941/01/02 Admit Type: Outpatient Age: 78 Room: [facility] ENDO ROOM 1 Gender: Female Note Status: Finalized Procedure:             Colonoscopy Indications:           Positive Cologuard test Providers:             [name] MD, MD Referring MD:          [name] (Referring MD) Medicines:             Monitored Anesthesia Care Complications:         No immediate complications. Procedure:             Pre-Anesthesia Assessment:                        - Prior to the procedure, a History and Physical was                         performed, and patient medications, allergies and                         sensitivities were reviewed. The patient's tolerance                         of previous anesthesia was reviewed.                        - The risks and benefits of the procedure and the                         sedation options and risks were discussed with the                         patient. All questions were answered and informed                         consent was obtained.                        - ASA Grade Assessment: III - A patient with severe                         systemic disease.                        After obtaining informed consent, the colonoscope was                         passed under direct vision. Throughout the procedure,                         the patient's blood pressure, pulse, and oxygen                         saturations were monitored continuously. The                         Colonoscope was introduced through the anus and  advanced to the the cecum, identified by the                         appendiceal orifice. The colonoscopy was performed                         with ease. The patient tolerated the procedure well.                         The quality of the bowel  preparation was excellent. Findings:      Two sessile polyps were found in the sigmoid colon and cecum. The polyps       were 5 to 7 mm in size. These polyps were removed with a cold snare.       Resection and retrieval were complete.      Multiple small-mouthed diverticula were found in the sigmoid colon.      Non-bleeding internal hemorrhoids were found during retroflexion. The       hemorrhoids were medium-sized and Grade I (internal hemorrhoids that do       not prolapse). Impression:            - Two 5 to 7 mm polyps in the sigmoid colon and in the                         cecum, removed with a cold snare. Resected and                         retrieved.                        - Diverticulosis in the sigmoid colon.                        - Non-bleeding internal hemorrhoids. Recommendation:        - Discharge patient to home (with escort).                        - Resume previous diet.                        - Continue present medications.                        - Await pathology results.                        - Repeat colonoscopy for surveillance based on                         pathology results. Procedure Code(s):     --- Professional ---                        [phone], Colonoscopy, flexible; with removal of                         tumor(s), polyp(s), or other lesion(s) by snare                         technique Diagnosis Code(s):     --- Professional ---  K63.5, Polyp of colon                        K64.0, First degree hemorrhoids                        R19.5, Other fecal abnormalities                        K57.30, Diverticulosis of large intestine without                         perforation or abscess without bleeding CPT copyright 2019 American Medical Association. All rights reserved. The codes documented in this report are preliminary and upon coder review may  be revised to meet current compliance requirements. [name], MD [name] MD,  MD 04/20/2019 9:33:46 AM This report has been signed electronically. Number of Addenda: 0 Note Initiated On: 04/20/2019 9:05 AM Scope Withdrawal Time: 0 hours 13 minutes 56 seconds  Total Procedure Duration: 0 hours 16 minutes 34 seconds  Estimated Blood Loss:  Estimated blood loss: none.      [facility]

## 2019-04-20 NOTE — Anesthesia Preprocedure Evaluation (Signed)
Anesthesia Evaluation  Patient identified by MRN, date of birth, ID band Patient awake    Reviewed: Allergy & Precautions, H&P , NPO status , Patient's Chart, lab work & pertinent test results  Airway Mallampati: II  TM Distance: >3 FB Neck ROM: full    Dental  (+) Upper Dentures, Lower Dentures   Pulmonary shortness of breath, with exertion and Long-Term Oxygen Therapy, asthma ,  Wears 2-3 L/min home O2, recently started last month for pulmonary fibrosis, followed by Pulmonology          Cardiovascular hypertension, (-) angina+CHF (grade 1 diastolic dysfunction, preserved EF)  (-) Past MI, (-) Cardiac Stents and (-) CABG (-) dysrhythmias      Neuro/Psych CVA (2019), No Residual Symptoms negative psych ROS   GI/Hepatic Neg liver ROS, GERD  Controlled,  Endo/Other  Hypothyroidism   Renal/GU negative Renal ROS  negative genitourinary   Musculoskeletal   Abdominal   Peds  Hematology negative hematology ROS (+)   Anesthesia Other Findings Past Medical History: No date: Anginal pain (HCC) No date: Cancer ([place]) 01/22/2012: CHF (congestive heart failure) ([place]) 07/15/2014: Chronic fatigue 04/18/2018: CVA (cerebral vascular accident) ([place]) 05/12/2017: GERD (gastroesophageal reflux disease) No date: Hypertension No date: Hypothyroidism 02/04/2012: ILD (interstitial lung disease) ([place]) 2013: IPF (idiopathic pulmonary fibrosis) ([place]) 01/29/2016: Mild intermittent asthma No date: Pelvic relaxation No date: Prolapse of bladder No date: Shortness of breath No date: Thyroid disease  Past Surgical History: 12/05/2011: ANTERIOR AND POSTERIOR REPAIR     Comment:  Procedure: ANTERIOR (CYSTOCELE) AND POSTERIOR REPAIR               (RECTOCELE);  Surgeon: [name], MD;  Location:               [facility];  Service: Gynecology;  Laterality: N/A;  with               cysto 15 years ago: Bladder tact 01/22/2012: LEFT HEART  CATHETERIZATION WITH CORONARY ANGIOGRAM; N/A     Comment:  Procedure: LEFT HEART CATHETERIZATION WITH CORONARY               ANGIOGRAM;  Surgeon: [name], MD;  Location: [facility]               CATH LAB;  Service: Cardiovascular;  Laterality: N/A; 2010: NECK SURGERY No date: SHOULDER SURGERY No date: thyroid disease 07/17/2018: THYROID LOBECTOMY; Left     Comment:  Procedure: LEFT THYROID LOBECTOMY;  Surgeon: [name], MD;  Location: [facility];  Service: General;                Laterality: Left; No date: TONSILLECTOMY 15 years ago: VAGINAL HYSTERECTOMY  BMI    Body Mass Index: 22.86 kg/m      Reproductive/Obstetrics negative OB ROS                            Anesthesia Physical Anesthesia Plan  ASA: III  Anesthesia Plan: General   Post-op Pain Management:    Induction:   PONV Risk Score and Plan: Propofol infusion and TIVA  Airway Management Planned:   Additional Equipment:   Intra-op Plan:   Post-operative Plan:   Informed Consent: I have reviewed the patients History and Physical, chart, labs and discussed the procedure including the risks, benefits and alternatives for the proposed anesthesia with the patient or  authorized representative who has indicated his/her understanding and acceptance.     Dental Advisory Given  Plan Discussed with: Anesthesiologist, CRNA and Surgeon  Anesthesia Plan Comments:         Anesthesia Quick Evaluation

## 2019-04-20 NOTE — Anesthesia Post-op Follow-up Note (Signed)
Anesthesia QCDR form completed.        

## 2019-04-21 ENCOUNTER — Encounter: Payer: Self-pay | Admitting: Gastroenterology

## 2019-04-21 LAB — SURGICAL PATHOLOGY

## 2019-04-21 NOTE — Anesthesia Postprocedure Evaluation (Signed)
Anesthesia Post Note  Patient: Tina Calderon  Procedure(s) Performed: COLONOSCOPY WITH PROPOFOL (N/A )  Patient location during evaluation: PACU Anesthesia Type: General Level of consciousness: awake and alert Pain management: pain level controlled Vital Signs Assessment: post-procedure vital signs reviewed and stable Respiratory status: spontaneous breathing, nonlabored ventilation and respiratory function stable Cardiovascular status: blood pressure returned to baseline and stable Postop Assessment: no apparent nausea or vomiting Anesthetic complications: no     Last Vitals:  Vitals:   04/20/19 0958 04/20/19 1008  BP: (!) 144/83 (!) 142/84  Pulse: 65 64  Resp: 20 (!) 28  Temp:    SpO2: 97% 96%    Last Pain:  Vitals:   04/21/19 0757  TempSrc:   PainSc: 0-No pain                 Durenda Hurt

## 2019-04-29 ENCOUNTER — Other Ambulatory Visit: Payer: Self-pay

## 2019-04-29 ENCOUNTER — Ambulatory Visit: Payer: Medicare Other | Admitting: Internal Medicine

## 2019-04-29 ENCOUNTER — Encounter: Payer: Self-pay | Admitting: Internal Medicine

## 2019-04-29 VITALS — BP 132/78 | HR 80 | Ht 62.5 in | Wt 123.2 lb

## 2019-04-29 DIAGNOSIS — R634 Abnormal weight loss: Secondary | ICD-10-CM | POA: Diagnosis not present

## 2019-04-29 DIAGNOSIS — J84112 Idiopathic pulmonary fibrosis: Secondary | ICD-10-CM

## 2019-04-29 DIAGNOSIS — Z5181 Encounter for therapeutic drug level monitoring: Secondary | ICD-10-CM | POA: Diagnosis not present

## 2019-04-29 DIAGNOSIS — J9611 Chronic respiratory failure with hypoxia: Secondary | ICD-10-CM | POA: Diagnosis not present

## 2019-04-29 LAB — HEPATIC FUNCTION PANEL
ALT: 18 U/L (ref 0–35)
AST: 25 U/L (ref 0–37)
Albumin: 4.2 g/dL (ref 3.5–5.2)
Alkaline Phosphatase: 58 U/L (ref 39–117)
Bilirubin, Direct: 0.1 mg/dL (ref 0.0–0.3)
Total Bilirubin: 0.5 mg/dL (ref 0.2–1.2)
Total Protein: 7.1 g/dL (ref 6.0–8.3)

## 2019-04-29 NOTE — Patient Instructions (Addendum)
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 to get lighter portable o2 system - meet 04/29/2019   - continue ofev per schedule twice daily  - continue 2LNC at night since end oct 2020 - keeep appt with  Roma Kayser - [facility] - due to strng family hx of fibrosis  Weight loss - new  - could be IPF, Could be drugs - need to monitor  followup  - 4 weeks do LFT test in lab and do video/phone visit with me or an APP to monitor side effect of ofev  - do spirometry and dlco in end jan/early feb 20201  -return to ILD clinic 30 min slot after pft in end [place] /early feb 2021

## 2019-04-29 NOTE — Addendum Note (Signed)
Addended by: Suzzanne Cloud E on: 04/29/2019 11:41 AM   Modules accepted: Orders

## 2019-04-29 NOTE — Progress Notes (Signed)
78 yo female never smoker seen for initial pulmonary consult during hospital for acute respiratory failure   [facility] follow up 01/30/12 -  Pt was admitted 2 weeks ago for chest pain and dyspnea.  Found to have Acute respiratory failure with hypoxia--  Tx for possible diastolic CHF with aggressive diuresis Echo showed EF 60% , no mention of diastolic dysfxn.  HRCT consistent with ILD-  CCM consult with autoimmune workup that was unrevealing with neg ANA, ANCA, RA factor , nml ESR , CRP    Does light housework but not able walk long distances due to dyspnea  FH of pulmonary fibrosis -sibling -they were smokers. NO significant desats with walking in cardiology yesterday, desated to 93% on room air.  No cough or wheezing  No edema.   Since discharge she is feeling better with less DOE. But does get winded with walking long distances.   ROV 02/21/12 -- follows up for ILD noted during hospitalization for resp failure. Treated for possible diastolic CHF. CT scan shows some mild basilar fibrotic change. PFT's 9/3 >> probable mixed disease, no BD response, restricted volumes, decrease DLCO that corrects for Va.  She had family hx of ILD.  She had walking oximetry that she says was reassuring. She feels no better today than at hospital discharge.   ROV 03/24/12 -- ILD without clear etiology, mixed dz on PFT 02/11/12. Initiated a trial prednisone last month >> She doesn't believe that her dyspnea has changed.  Last time she did not desat.   ROV 06/23/12 -- ILD without clear etiology, mixed dz on PFT 02/11/12. Trial of pred >> weaned to off. Sent her to pulm rehab at [facility], feels that it has benefited her. She isn't as SOB as before. CXR today >> stable bibasilar ILD, hiatal hernia.   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 Also possible component diastolic dysfxn. She has a family hx of ILD.  She tells me that her dyspnea is worse compared with last year. She is having some spells of near syncope on exertion. Walked today to exhaustion without desaturation.   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 She has been part of the pirfenidone trial and has been on the medication. She will be transitioning to the commercial drug. Denies any side effects from the medication. I completed financial assistance forms on 04/12/13.  Due for CXR today. Not really doing exercise - she did do pulmonary rehab at [facility].    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 Last CXR 05/19/13, last CT scan 01/24/12. She is still on the pirfenidone trial, receiving study drug. She is fairly stable. Will be converted to the commercial med soon. Tolerates soon.   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 Returns for f/u. She is now on pirfenidone 3 pills tid and is tolerating. She is having more problems with fatigue and dyspnea with exertion - has lay down after eating dinner, with chores. Her synthroid hasn't been adjusted in a long time.  Her last labs were March 3, 15.   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 She is on pirfenidone 3 pills tid. She has been tolerating well from GI standpoint. TSH normal. She has some fatigue, otherwise doing  well. She needs LFT done in 3 months and after that we can go to every 6 months,.   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 On pirfenidone 3 pills tid since August 2014. Last CT scan 5/'15 was stable, described as an NSIP pattern.  LFT were done today, not yet available. She has noticed some slightly raised pale pink macules, not pruritic, since last month. She has  been careful to wear sunscreen.    ROV 12/01/17 --this follow-up visit for patient with a history of idiopathic interstitial lung disease, some coexisting obstruction based on her spirometry.  She also has a hiatal hernia with GERD and chronic cough.  We have managed her on pirfenidone.  At our last visit she noted that she is had some decreased functional capacity, more dyspnea with exertion. She is still having decreased energy, more fatigue with activity. We checked CBC and TSH, both reassuring. She keeps a slight cough, added chlorpheniramine to loratadine. Remains on omeprazole.    She underwent a 6-minute walk test today and was able to walk 462 m without desaturation.  Her last in 04/2016 was 418mRepeat CT chest from 11/28/2017 reviewed and shows stable interstitial changes compared with her most recent from 2017.  There is patchy groundglass attenuation with associated septal thickening and peripheral bronchial ectasis without overt honeycomb change.  No effusion, no suspicious nodules.   ROV 06/18/18 --Ms. Daigle is 89 followed for history of idiopathic interstitial lung disease and some coexisting obstruction noted on spirometry.  She also has a hiatal hernia with GERD and some associated chronic cough.  We have been managing her on pirfenidone - no side effects.  She is on omeprazole, loratadine, chlorpheniramine.  Since our last visit she was hospitalized for a stroke, has some residual word-finding trouble. She is scheduled for nerve conduction studies after some falls and leg weakness. She is also being evaluated for possible thyroid surgery with Dr. GHarlow Asa She had a CT chest done 01/12/18 that I reviewed, shows no real change in NSIP pattern, large HH, no new infiltrates. Overall reports that breathing is stable, not limiting.   ROV 12/03/2018 --pleasant 78year old woman with mixed lung disease.  She has mild obstruction and restriction in the setting of a hiatal hernia and idiopathic  interstitial lung disease that would characterizes IPF.  We have been managing her on pirfenidone.  Also with a history of GERD on Prilosec, allergic rhinitis on loratadine. Last LFT in 05/2018 were normal. Last Ct chest was 01/2018. She is having progressive exertional SOB compared with last time. She has to rest doing housework, taking out the trash. She has to stop to rest. Not on BD's, didn't benefit. She had thyroid sgy in 07/2018, just had her synthroid increased in 07/2018. TSH was 3.05 5/26.     OV 02/25/2019  Subjective:  Patient ID: NRutherford Limerick female , DOB: 704-09-1940, age 89y.o. , MRN: [id number], [id number] [id number]  02/25/2019 -   Chief Complaint  Patient presents with   IPF (idiopathic pulmonary fibrosis)    Had 6 minute walk yesterday.     Clinical IPF patient  - On Esbreit since aug 2014 initially via  expanded access program   - Autoimmune panel 2013 and 2014 is negative and normal   - Pulmonary function test 02/15/2014 postbronchodilator FVC 1.55 L/  59%, total lung capacity 3.05 L/64%, DLCO 14.3/66%  - CT  chest  May 2015 - NSIP per radioolgy  - Trial participagnt early Jul 23, 2014 for 6 months IPF - Phase 4 study - got esbriet and 6 months of add-on ofev   HPI Lurae Hornbrook 78 y.o. -has now been referred to the ILD clinic.  This is because she has had progression in her symptoms.  She says for the last few months she has had progressive shortness of breath but no worsening cough or pedal edema or hemoptysis or proximal nocturnal dyspnea.  She is tolerating the Esbriet well since August 2014 when she was in the expanded access program.  Overall she is stable and she is active in the pulmonary fibrosis foundation support group but in the last few months is progressive dyspnea.  Nurse practitioner did a high-resolution CT scan of the chest and it shows progression although the pattern is still indeterminate for UIP.  In addition  6-minute walk test last week she had new onset desaturation and has been started on portable oxygen which is helping.  However she does not have nighttime oxygen.  There is a spirometry that is upcoming.  Review of the spirometry is between 07-23-13 and 07/24/2015 show clear progression in both FVC and DLCO.  Previously serology was negative in 20 13/2014 but she says she might have [facility] Echocardiogram November 2019 with normal ejection fraction and grade 1 diastolic dysfunction  [place] Integrated Comprehensive ILD Questionnaire  Symptoms: -ILD diagnosed in 20 14/2015.  Which is gradually worse in the last several months.  She is having level for shortness of breath the household work shopping and walking on level ground at her own pace.  She does not think any other condition is limiting her.  No cough.  No wheezing   Past Medical History : Negative for collagen vascular disease acid reflux but positive for previous stroke in November 2019 and thyroid disease.  Detailed past medical history is negative otherwise   ROS: Positive for fatigue   FAMILY HISTORY of LUNG DISEASE: Her sister died in 53 from pulmonary fibrosis and a brother died in July 23, 2004 from pulmonary fibrosis   EXPOSURE HISTORY: No smoking no cigars.  No vaping no marijuana.  No cocaine.  No intravenous drug use.   HOME and HOBBY DETAILS : Lives in an apartment.  No mold exposure no mildew exposure.  Extensive home exposure history is negative.   OCCUPATIONAL HISTORY (122 questions) : Positive for gardening but otherwise negative.   PULMONARY TOXICITY HISTORY (27 items): Intermittent prednisone burst otherwise negative.     Results for JAZLYNN, NEMETZ (MRN [id number]) as of 02/25/2019 11:43  Ref. Range 02/15/2014 11:51 07/15/2014 11:47 01/03/2015 09:40 03/17/2015 13:03 10/23/2015 08:45  FVC-Pre Latest Units: L 1.58 1.59 1.52 1.47 1.44   Results for JAYLEENE, GLAESER (MRN [id number]) as of 02/25/2019 11:43  Ref. Range 02/15/2014  11:51 07/15/2014 11:47 01/03/2015 09:40 03/17/2015 13:03 10/23/2015 08:45  DLCO unc Latest Units: ml/min/mmHg 14.30 13.65 13.66 12.11 12.65  DLCO unc % pred Latest Units: % 66 63 63 56 58    CT chest high resolution Aug 2020 -personally visualized  standard protocol without intravenous contrast. High resolution imaging of the lungs, as well as inspiratory and expiratory imaging, was performed.  COMPARISON:  11/28/2017, 10/09/2015, 01/24/2012  FINDINGS: Cardiovascular: Aortic atherosclerosis. Cardiomegaly. Left coronary artery calcifications. No pericardial effusion.  Mediastinum/Nodes: No enlarged mediastinal, hilar, or axillary lymph nodes. Large hiatal hernia with intrathoracic position of the gastric body and fundus. Thyroid gland, trachea, and esophagus demonstrate  no significant findings.  Lungs/Pleura: Redemonstrated moderate pulmonary fibrosis in a pattern with an apical to basal gradient, featuring peripheral interstitial opacity and some suggestion of nodularity, mild traction bronchiectasis, and occasional areas of subpleural bronchiolectasis in the bases. There is no evidence of honeycombing. Moderate lobular air trapping on expiratory phase imaging. Findings are not significantly changed when compared to immediate prior examination dated 11/28/2017, however are slightly worsened over time on comparison to multiple prior examinations dating back to at least 01/24/2012. No pleural effusion or pneumothorax.  Upper Abdomen: No acute abnormality.  Musculoskeletal: No chest wall mass or suspicious bone lesions identified.  IMPRESSION: 1. Redemonstrated moderate pulmonary fibrosis in a pattern with an apical to basal gradient, featuring peripheral interstitial opacity and some suggestion of nodularity, mild traction bronchiectasis, and occasional areas of subpleural bronchiolectasis in the bases. There is no evidence of honeycombing. Moderate lobular air trapping  on expiratory phase imaging. Findings are not significantly changed when compared to immediate prior examination dated 11/28/2017, however are slightly worsened over time on comparison to multiple prior examinations dating back to at least 01/24/2012. Findings remain indeterminate for UIP and differential considerations include both UIP and chronic fibrosing NSIP.  2.  Coronary artery disease and aortic atherosclerosis.  3.  Hiatal hernia.   Electronically Signed   By: Eddie Candle M.D.   On: 01/15/2019 12:09  ROS - per HPI Recent Labs  Lab 02/23/19 1153  AST 20  ALT 13  ALKPHOS 47  BILITOT 0.3  PROT 6.8  ALBUMIN 4.1     OV 04/08/2019  Subjective:  Patient ID: [name], female , DOB: Apr 11, 1941 , age 31 y.o. , MRN: [id number] , ADDRESS: [id number] [place] [address]   04/08/2019 -   Chief Complaint  Patient presents with   Follow-up    Patient reports sob with exertion and a mild cough.     Clinical IPF patient    - strong family hx of ILD (brother died age 68, sister died in her 38s)  - Autoimmune panel 2013 and 2014 is negative and normal   - Pulmonary function test 02/15/2014 postbronchodilator FVC 1.55 L/  59%, total lung capacity 3.05 L/64%, DLCO 14.3/66%  - CT chest  May 2015 - NSIP per radioolgy  - Rx  - - On Esbreit since aug 2014 initially via  expanded access program (Trial participagnt early 2016 for 6 months IPF - Phase 4 study - got esbriet and 6 months of add-on ofev)  - Switched to ofev due to progessive disease mid-oct 2020 (03/24/2019) - started on portable o2 sept 2020   HPI Waunetta Riggle [place] 78 y.o. - returns for followup following starting ofev 2 weeks ago. Therapy was changed due to progressive disease. She is tolerating nintedanib fine.  The couple of episodes of mild diarrhea that she had.  One time she had vomiting.  This is following a a family gathering.  Other than that she is tolerating nintedanib just  fine.  In terms of her ILD even though it is progressed compared to last visit in September she feels she is stable.  She is having significant cough.  She says vitamin C helps this.  She is using portable oxygen as needed.  She wants a lighter tank.  She has had overnight oxygen study with the results are still pending this was done a few days ago.  She reminded me about the strong family history of ILD that she has.  Her  brother died at age 80 7 years ago her sister died in her 67s also from ILD.  She is interested in seeing the Retail buyer.  I have sent email to genetics counselor Roma Kayser within our health system.    ROS   OV 04/29/2019  Subjective:  Patient ID: [name], female , DOB: 1941-03-31 , age 62 y.o. , MRN: [id number] , ADDRESS: [id number] [place] [address]   04/29/2019 -   Chief Complaint  Patient presents with   Follow-up    Pt states she has been doing well since last visit and denies any complaints.    Follow-up idiopathic pulmonary fibrosis progressive now on nintedanib since mid October 2020. HPI Salimatou Simone [place] 78 y.o. -returns for follow-up.  She is returning sooner than anticipated.  I saw her 3 weeks ago.  I wanted her back in 4-6 weeks to see how the nintedanib is going.  Due to scheduling issues she comes back in 3 weeks.  She tells me there are no new issues.  She is losing weight.  She did have interim colonoscopy which she says was normal.  She is unsure why she is losing weight.  I explained to her that this could be because of IPF or could be due to previous pirfenidone or current nintedanib.  At this point in time she wants to take the drugs and continue to monitor his situation.  Last liver function test 3 weeks ago was normal.  She is now on 2 L nasal cannula at night since end of October 2020.  She is also using oxygen with exertion.  She wants a light portable system.  She has called [employer].  She wants our  support in getting a lightweight portable oxygen system.       SYMPTOM SCALE - ILD 04/08/2019 126# 04/29/2019 123#  O2 use Portable o2 as needed   Shortness of Breath 0 -> 5 scale with 5 being worst (score 6 If unable to do)   At rest 1 3  Simple tasks - showers, clothes change, eating, shaving 1 2  Household (dishes, doing bed, laundry) 1 2  Shopping 1 3  Walking level at own pace 1 3  Walking keeping up with others of same age 60 3.5  Walking up Stairs 6 Do not do  Walking up Hill 6 Do not do  Total (40 - 48) Dyspnea Score 19   How bad is your cough? 1 3  How bad is your fatigue 2 4       Simple office walk 185 feet x  3 laps goal with forehead probe 04/08/2019  04/29/2019   O2 used RA RA  Number laps completed 3   3  Comments about pace - stopped at 2 Sopped at end of 2nd lap  Resting Pulse Ox/HR 96% and 104/min 95% and HR 80.min  Final Pulse Ox/HR 82% and 120/min 85% and 108/min   Desaturated </= 88% yes yes  Desaturated <= 3% points yes yes  Got Tachycardic >/= 90/min yes yes  Symptoms at end of test Very dyspneice Moderate dyspnea  Miscellaneous comments Maris Berger mid oct 2020        Results for ANIJA, BRICKNER [facility] (MRN [id number]) as of 04/08/2019 11:19  Ref. Range 07/15/2014 11:47 01/03/2015 09:40 03/17/2015 13:03 10/23/2015 08:45 03/12/2019 16:47 - startr ofev due to progerssiv diseae  FVC-Pre Latest Units: L 1.59 1.52 1.47 1.44 1.08  FVC-%Pred-Pre Latest Units: % 61  4 57 56 44  Results for RENAE, MOTTLEY [facility] (MRN [id number]) as of 04/08/2019 11:19  Ref. Range 02/15/2014 11:51 07/15/2014 11:47 01/03/2015 09:40 03/17/2015 13:03 10/23/2015 08:45 03/12/2019 16:47  DLCO unc Latest Units: ml/min/mmHg 14.30 13.65 13.66 12.11 12.65 8.71  DLCO unc % pred Latest Units: % 66 63 63 56 58 49      ROS - per HPI     has a past medical history of Anginal pain ([place]), Cancer ([place]), CHF (congestive heart failure) ([place]) (01/22/2012), Chronic fatigue (07/15/2014), CVA (cerebral  vascular accident) ([place]) (04/18/2018), GERD (gastroesophageal reflux disease) (05/12/2017), Hypertension, Hypothyroidism, ILD (interstitial lung disease) ([place]) (02/04/2012), IPF (idiopathic pulmonary fibrosis) ([place]) (2013), Mild intermittent asthma (01/29/2016), Pelvic relaxation, Prolapse of bladder, Shortness of breath, and Thyroid disease.   reports that she has never smoked. She has never used smokeless tobacco.  Past Surgical History:  Procedure Laterality Date   ANTERIOR AND POSTERIOR REPAIR  12/05/2011   Procedure: ANTERIOR (CYSTOCELE) AND POSTERIOR REPAIR (RECTOCELE);  Surgeon: [name], MD;  Location: [facility];  Service: Gynecology;  Laterality: N/A;  with cysto   Bladder tact  15 years ago   COLONOSCOPY WITH PROPOFOL N/A 04/20/2019   Procedure: COLONOSCOPY WITH PROPOFOL;  Surgeon: [name], MD;  Location[facility] ENDOSCOPY;  Service: Gastroenterology;  Laterality: N/A;   LEFT HEART CATHETERIZATION WITH CORONARY ANGIOGRAM N/A 01/22/2012   Procedure: LEFT HEART CATHETERIZATION WITH CORONARY ANGIOGRAM;  Surgeon: [name], MD;  Location: [facility] CATH LAB;  Service: Cardiovascular;  Laterality: N/A;   NECK SURGERY  2010   SHOULDER SURGERY     thyroid disease     THYROID LOBECTOMY Left 07/17/2018   Procedure: LEFT THYROID LOBECTOMY;  Surgeon: [name], MD;  Location: [facility];  Service: General;  Laterality: Left;   TONSILLECTOMY     VAGINAL HYSTERECTOMY  15 years ago    Allergies  Allergen Reactions   Dilaudid [Hydromorphone] Nausea And Vomiting   Crestor [Rosuvastatin Calcium] Other (See Comments)    Myalgias, muscle weakness    Immunization History  Administered Date(s) Administered   Influenza Split 02/22/2012, 03/01/2013, 02/08/2014, 01/27/2015   Influenza Whole 04/11/2011, 02/10/2017   Influenza, High Dose Seasonal PF 02/16/2017, 01/20/2019   Influenza,inj,Quad PF,6+ Mos 03/11/2016   Influenza-Unspecified 01/26/2018   Pneumococcal Conjugate-13  03/29/2014   Pneumococcal Polysaccharide-23 12/08/2017   Pneumococcal-Unspecified 04/11/2011, 05/13/2016   Td 08/30/2008   Tdap 11/16/2017   Zoster 08/07/2011   Zoster Recombinat (Shingrix) 09/20/2016, 02/19/2017    Family History  Problem Relation Age of Onset   Thyroid disease Mother    Hypertension Mother    Coronary artery disease Mother    Dementia Mother    Stroke Mother    Aneurysm Father    Pulmonary fibrosis Sister    Stroke Other    Sleep apnea Other    Cancer Brother        LIVER   Pulmonary fibrosis Brother      Current Outpatient Medications:    acetaminophen (TYLENOL) 500 MG tablet, Take 1,000 mg by mouth every 6 (six) hours as needed for mild pain or headache. , Disp: , Rfl:    amLODipine (NORVASC) 5 MG tablet, TAKE 1 TABLET BY MOUTH EVERY DAY FOR BLOOD PRESSURE, Disp: 90 tablet, Rfl: 1   atorvastatin (LIPITOR) 40 MG tablet, Take 1 tablet (40 mg total) by mouth at bedtime. For cholesterol., Disp: 90 tablet, Rfl: 3   CALCIUM CARBONATE-VITAMIN D PO, Take 1 tablet by mouth daily. , Disp: , Rfl:  clopidogrel (PLAVIX) 75 MG tablet, Take 1 tablet (75 mg total) by mouth daily. For stroke prevention., Disp: 90 tablet, Rfl: 3   CRANBERRY PO, Take 1 capsule by mouth every Monday, Wednesday, and Friday. , Disp: , Rfl:    ferrous sulfate 325 (65 FE) MG tablet, Take 325 mg by mouth daily., Disp: , Rfl:    levothyroxine (SYNTHROID) 100 MCG tablet, Take 1 tablet by mouth every morning on an empty stomach. No food or other medications for 30 minutes., Disp: 90 tablet, Rfl: 1   loratadine (CLARITIN) 10 MG tablet, Take 10 mg by mouth daily. , Disp: , Rfl:    metoprolol succinate (TOPROL-XL) 25 MG 24 hr tablet, Take 1 tablet (25 mg total) by mouth daily., Disp: 30 tablet, Rfl: 11   Multiple Vitamins-Minerals (CENTRUM SILVER PO), Take 1 tablet by mouth daily., Disp: , Rfl:    OFEV 150 MG CAPS, 150 mg. As Directed, Disp: , Rfl:    omeprazole (PRILOSEC)  20 MG capsule, TAKE 1 CAPSULE BY MOUTH EVERY DAY, Disp: 90 capsule, Rfl: 1      Objective:   Vitals:   04/29/19 1016  BP: 132/78  Pulse: 80  SpO2: 95%  Weight: 123 lb 3.2 oz (55.9 kg)  Height: 5' 2.5" (1.588 m)    Estimated body mass index is 22.17 kg/m as calculated from the following:   Height as of this encounter: 5' 2.5" (1.588 m).   Weight as of this encounter: 123 lb 3.2 oz (55.9 kg).  _0 @  Filed Weights   04/29/19 1016  Weight: 123 lb 3.2 oz (55.9 kg)     Physical Exam  General Appearance:    Alert, cooperative, no distress, appears stated age - yes , Deconditioned looking - no , OBESE  - no, Sitting on Wheelchair -  no  Head:    Normocephalic, without obvious abnormality, atraumatic  Eyes:    PERRL, conjunctiva/corneas clear,  Ears:    Normal TM's and external ear canals, both ears  Nose:   Nares normal, septum midline, mucosa normal, no drainage    or sinus tenderness. OXYGEN ON  - no . Patient is @ ra   Throat:   Lips, mucosa, and tongue normal; teeth and gums normal. Cyanosis on lips - no  Neck:   Supple, symmetrical, trachea midline, no adenopathy;    thyroid:  no enlargement/tenderness/nodules; no carotid   bruit or JVD  Back:     Symmetric, no curvature, ROM normal, no CVA tenderness  Lungs:     Distress - no , Wheeze no, Barrell Chest - no, Purse lip breathing - no, Crackles - yes   Chest Wall:    No tenderness or deformity.    Heart:    Regular rate and rhythm, S1 and S2 normal, no rub   or gallop, Murmur - no  Breast Exam:    NOT DONE  Abdomen:     Soft, non-tender, bowel sounds active all four quadrants,    no masses, no organomegaly. Visceral obesity - no  Genitalia:   NOT DONE  Rectal:   NOT DONE  Extremities:   Extremities - normal, Has Cane - no, Clubbing - no, Edema - no  Pulses:   2+ and symmetric all extremities  Skin:   Stigmata of Connective Tissue Disease - no  Lymph nodes:   Cervical, supraclavicular, and axillary nodes  normal  Psychiatric:  Neurologic:   Pleasant - yesn, Anxious - no, Flat affect - no  CAm-ICU -  neg, Alert and Oriented x 3 - yes, Moves all 4s - yes, Speech - normal, Cognition - intact           Assessment:       ICD-10-CM   1. Chronic respiratory failure with hypoxia (HCC)  J96.11   2. 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 Therapeutic drug monitoring  Z51.81 Hepatic function panel  5. 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[place]) Therapeutic drug monitoring  Disease is progressive I think we did the right call by making a change to ofev [place] so far tolerating ofev well since mod oct  Plan - check LFT 04/29/2019  - meet [facility] to get lighter portable o2 system - meet 04/29/2019   - continue ofev per schedule twice daily  - continue 2LNC at night since end oct 2020 - keeep appt with  Roma Kayser - [facility] - due to strng family hx of fibrosis  Weight loss - new  - could be IPF, Could be drugs - need to monitor  followup  - 4 weeks do LFT test in lab and do video/phone visit with me or an APP to monitor side effect of ofev  - do spirometry and dlco in end jan/early feb 20201  -return to ILD clinic 30 min slot after pft in end jan /early feb 2021     SIGNATURE    Dr. Brand Males, M.D., F.C.C.P,  Pulmonary and Critical Care Medicine Staff Physician, [place] Director - Interstitial Lung Disease  Program  Pulmonary [place] [facility] [place], [place], [address]  Pager: [phone], If no answer or between  15:00h - 7:00h: call [phone] Telephone: [phone]  11:10 AM 04/29/2019

## 2019-05-02 ENCOUNTER — Encounter: Payer: Self-pay | Admitting: Gastroenterology

## 2019-05-04 NOTE — Progress Notes (Signed)
Left detailed msg on machine ok per [facility]

## 2019-05-13 ENCOUNTER — Inpatient Hospital Stay: Payer: Medicare Other | Attending: Genetic Counselor | Admitting: Licensed Clinical Social Worker

## 2019-05-13 ENCOUNTER — Other Ambulatory Visit: Payer: Self-pay | Admitting: Licensed Clinical Social Worker

## 2019-05-13 ENCOUNTER — Encounter: Payer: Self-pay | Admitting: Licensed Clinical Social Worker

## 2019-05-13 ENCOUNTER — Inpatient Hospital Stay: Payer: Medicare Other

## 2019-05-13 ENCOUNTER — Other Ambulatory Visit: Payer: Self-pay

## 2019-05-13 DIAGNOSIS — J84112 Idiopathic pulmonary fibrosis: Secondary | ICD-10-CM

## 2019-05-13 DIAGNOSIS — D509 Iron deficiency anemia, unspecified: Secondary | ICD-10-CM

## 2019-05-13 DIAGNOSIS — Z836 Family history of other diseases of the respiratory system: Secondary | ICD-10-CM | POA: Insufficient documentation

## 2019-05-13 NOTE — Progress Notes (Signed)
REFERRING PROVIDER: Brand Males, [address],  [place] [address]  PRIMARY PROVIDER:  [name], NP  PRIMARY REASON FOR VISIT:  1. IPF (idiopathic pulmonary fibrosis) / IPAF   2. Family history of pulmonary fibrosis      HISTORY OF PRESENT ILLNESS:   Ms. Kwiecinski, a 78 y.o. female, was seen for a [place] cancer genetics consultation at the request of Dr.Ramaswamy due to a personal and family history of pulmonary fibrosis. Ms. Mumme presents to clinic today to discuss the possibility of a hereditary predisposition to cancer/pulmonary fibrosis and genetic testing. Ms. Passarelli is a 78 y/o F with no personal history of cancer.  At the age of 32, Ms. Creswell was diagnosed with pulmonary fibrosis.    RISK FACTORS:  Patient reports her father and all 4 siblings and her two children smoked, but she did not. She reports not having early greying of her hair and no occupational exposures. She had colonoscopy recently and a few polyps were removed.   Past Medical History:  Diagnosis Date  . Anginal pain ([place])   . Cancer ([place])   . CHF (congestive heart failure) ([place]) 01/22/2012  . Chronic fatigue 07/15/2014  . CVA (cerebral vascular accident) ([place]) 04/18/2018  . Family history of pulmonary fibrosis   . GERD (gastroesophageal reflux disease) 05/12/2017  . Hypertension   . Hypothyroidism   . ILD (interstitial lung disease) ([place]) 02/04/2012  . IPF (idiopathic pulmonary fibrosis) ([place]) 2013  . Mild intermittent asthma 01/29/2016  . Pelvic relaxation   . Prolapse of bladder   . Shortness of breath   . Thyroid disease     Past Surgical History:  Procedure Laterality Date  . ANTERIOR AND POSTERIOR REPAIR  12/05/2011   Procedure: ANTERIOR (CYSTOCELE) AND POSTERIOR REPAIR (RECTOCELE);  Surgeon: [name], MD;  Location: [facility];  Service: Gynecology;  Laterality: N/A;  with cysto  . Bladder tact  15 years ago  . COLONOSCOPY WITH PROPOFOL N/A 04/20/2019    Procedure: COLONOSCOPY WITH PROPOFOL;  Surgeon: [name], MD;  Location: [facility] ENDOSCOPY;  Service: Gastroenterology;  Laterality: N/A;  . LEFT HEART CATHETERIZATION WITH CORONARY ANGIOGRAM N/A 01/22/2012   Procedure: LEFT HEART CATHETERIZATION WITH CORONARY ANGIOGRAM;  Surgeon: [name], MD;  Location: [facility] CATH LAB;  Service: Cardiovascular;  Laterality: N/A;  . NECK SURGERY  2010  . SHOULDER SURGERY    . thyroid disease    . THYROID LOBECTOMY Left 07/17/2018   Procedure: LEFT THYROID LOBECTOMY;  Surgeon: [name], MD;  Location: [facility];  Service: General;  Laterality: Left;  . TONSILLECTOMY    . VAGINAL HYSTERECTOMY  15 years ago    Social History   Socioeconomic History  . Marital status: Married    Spouse name: Not on file  . Number of children: Not on file  . Years of education: Not on file  . Highest education level: Not on file  Occupational History  . Not on file  Social Needs  . Financial resource strain: Not on file  . Food insecurity    Worry: Not on file    Inability: Not on file  . Transportation needs    Medical: Not on file    Non-medical: Not on file  Tobacco Use  . Smoking status: Never Smoker  . Smokeless tobacco: Never Used  Substance and Sexual Activity  . Alcohol use: No  . Drug use: No  . Sexual activity: Never    Birth  control/protection: Post-menopausal, Surgical    Comment: HYST  Lifestyle  . Physical activity    Days per week: Not on file    Minutes per session: Not on file  . Stress: Not on file  Relationships  . Social Herbalist on phone: Not on file    Gets together: Not on file    Attends religious service: Not on file    Active member of club or organization: Not on file    Attends meetings of clubs or organizations: Not on file    Relationship status: Not on file  Other Topics Concern  . Not on file  Social History Narrative   Married.   2 children, 5 grandchildren.   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    Enjoys puzzles, sewing, traveling, reading.      FAMILY HISTORY:  We obtained a detailed, 4-generation family history.  Significant diagnoses are listed below: Family History  Problem Relation Age of Onset  . Thyroid disease Mother   . Hypertension Mother   . Coronary artery disease Mother   . Dementia Mother   . Stroke Mother   . Aneurysm Father   . Pulmonary fibrosis Sister   . Stroke Other   . Sleep apnea Other   . Liver disease Brother   . Pulmonary fibrosis Brother   . Stroke Maternal Grandmother   . Dementia Maternal Grandmother   . Heart attack Maternal Grandfather 67  . COPD Son    Ms. Capps has 2 children: Johnny, 2 and Santiago Glad, 44. Both have had history of smoking but have quit. Charlotte Crumb was recently diagnosed with COPD. Ms. Zhang had 2 brothers and 2 sisters. One of her brothers passed from pulmonary fibrosis at 62 and a sister passed from pulmonary fibrosis at 3. Another brother passed due to liver failure. She has one sister, Stanton Kidney, who is living and healthy. No nieces or nephews with cancer or pulmonary fibrosis.  Ms. Smead mother died at 6. Patient had 4 maternal aunts, no cancers and no pulmonary fibrosis. No known cancers or pulmonary fibrosis in cousins. Maternal grandmother had a stroke and dementia and passed at 79. Maternal grandfather had a heart attack and passed at 60.   Ms. Zertuche father died at 16 due to an aneurysm. Patient had 12 paternal aunts and uncles but has limited information about them. No known cancers or pulmonary fibrosis that she is aware of for these aunts/uncles/cousins. Paternal grandmother died young in a horse accident, and grandfather died at 65 of old age.   Ms. Mcdevitt is unaware of previous family history of genetic testing. Patient's maternal ancestors are of [place] descent, and paternal ancestors are of [place] descent. There is no reported Ashkenazi Jewish ancestry. There is no known consanguinity.  GENETIC COUNSELING  ASSESSMENT: Ms. Mcintire is a 78 y.o. female with a personal and family history of pulmonary fibrosis that is somewhat suggestive of a hereditary condition. We, therefore, discussed and recommended the following at today's visit.   DISCUSSION: Pulmonary fibrosis is a group of lung diseases characterized by development of scarring in the lungs. There are several different subtypes of pulmonary fibrosis, including idiopathic pulmonary fibrosis (IPF), with different causes that are defined based on their appearance on imaging tests like a CT scan or how lung tissue looks under the microscope after a lung biopsy. The incidence of pulmonary fibrosis in the general population is approximately 6-16/100,000 people in the [place].   Familial Pulmonary Fibrosis (FPF) is defined  by having two or more first degree relatives (parent, sibling or child) in a family with a diagnosis of pulmonary fibrosis. In FPF, there is evidence that an underlying inherited genetic component may contribute to the development of pulmonary fibrosis, but environmental factors may also contribute. At present, several genes are known to be associated with FPF and account for approximately 20-25% of FPF cases. Thus, additional genes remain to be discovered.   Approximately 20% of patients with FPF have a change (or mutation) in one of their telomerase genes, including TERT, TERC, RTEL1, and PARN. Individuals with mutations in these genes may also be at risk for other health conditions such as low blood counts, early gray hair, or liver disease. Rarely, FPF is due to mutations in other genes such as SFTPC or SFTPA2. The inheritance pattern in FPF is unclear and may vary from family to family, but autosomal dominance with reduced penetrance appears most likely. This means that it only takes one copy of the genetic factor inherited from one parent to have risk for the condition and there is a 50/50 chance of inheriting that genetic factor.  However, reduced penetrance means that not all individuals who inherit the genetic factor will develop symptoms or the disease.  We discussed the implications of a negative, positive and/or variant of uncertain significant result. We recommended Ms. Bonnes pursue genetic testing the Pulmonary Fibrosis gene panel.   The Pulmonary Fibrosis Slice gene panel offered by [facility] includes sequencing and/or duplication/deletion testing of the following 10 genes: ABCA3, DKC1, NAF1, PARN, RTEL1, SFTPA2, SFTPC, TERC, TERT, and TINF2.  There are no testing criteria for pulmonary fibrosis, however Ms. Winnie does meet criteria for familial pulmonary fibrosis based on her personal and family history. Patient may have an out of pocket cost.   PLAN: After considering the risks, benefits, and limitations, Ms. Daughdrill provided informed consent to pursue genetic testing and the blood sample was sent to [facility] for analysis of the Pulmonary Fibrosis Slice test number 098 . Results should be available within approximately 8 weeks' time, at which point they will be disclosed by telephone to Ms. Spear, as will any additional recommendations warranted by these results. Ms. Riso will receive a summary of her genetic counseling visit and a copy of her results once available. This information will also be available in Epic.   Ms. Easterwood questions were answered to her satisfaction today. Our contact information was provided should additional questions or concerns arise. Thank you for the referral and allowing [place] to share in the care of your patient.   Faith Rogue, MS, [facility] Genetic Counselor [email or web address] Phone: [phone]  The patient was seen for a total of 35 minutes in face-to-face genetic counseling.  Drs. Magrinat, Lindi Adie and/or Burr Medico were available for discussion regarding this case.   _______________________________________________________________________ For Office Staff:   Number of people involved in session: 1 Was an Intern/ student involved with case: no

## 2019-05-18 ENCOUNTER — Other Ambulatory Visit: Payer: Self-pay

## 2019-05-18 ENCOUNTER — Encounter: Payer: Self-pay | Admitting: Cardiology

## 2019-05-18 ENCOUNTER — Ambulatory Visit: Payer: Medicare Other | Admitting: Cardiology

## 2019-05-18 VITALS — BP 108/72 | HR 100 | Temp 97.1°F | Ht 62.5 in | Wt 126.4 lb

## 2019-05-18 DIAGNOSIS — J84112 Idiopathic pulmonary fibrosis: Secondary | ICD-10-CM | POA: Diagnosis not present

## 2019-05-18 DIAGNOSIS — R002 Palpitations: Secondary | ICD-10-CM | POA: Diagnosis not present

## 2019-05-18 DIAGNOSIS — Z7189 Other specified counseling: Secondary | ICD-10-CM

## 2019-05-18 DIAGNOSIS — R0602 Shortness of breath: Secondary | ICD-10-CM | POA: Diagnosis not present

## 2019-05-18 DIAGNOSIS — Z8673 Personal history of transient ischemic attack (TIA), and cerebral infarction without residual deficits: Secondary | ICD-10-CM

## 2019-05-18 DIAGNOSIS — I10 Essential (primary) hypertension: Secondary | ICD-10-CM | POA: Diagnosis not present

## 2019-05-18 NOTE — Progress Notes (Signed)
Cardiology Office Note:    Date:  05/18/2019   ID:  Tina Calderon, DOB [birth year], MRN [id number]  PCP:  [name], NP  Cardiologist:  Buford Dresser, MD PhD  Referring MD: [name], NP   CC: Follow up  History of Present Illness:    Tina Calderon is a 78 y.o. female with a hx of pulmonary fibrosis who is seen in follow up for evaluation and management of tachycardia, fatigue, exertional intolerance.  Summary from initial visit 01/22/18: She presented to [facility] ER on 01/12/18 with palpitations and shortness of breath. At pulmonary rehab that morning, her heart rate was noted to be 120 bpm (patient states this was at rest). In the ER, her ECG showed sinus tachycardia at 104 bpm, CT was negative for PE, negative troponin. She feels exhausted with basic everyday activities. For instance, she cooks breakfast every morning for herself and her husband. By the time she is done, she has no appetite. Often she has to lie down to rest for a time after. She is supposed to do about 50 minutes of near continuous cardiovascular activity at pulmonary rehab, and she is completely exhausted by the end of this. She endorses good food and fluid intake despite her fatigue. No infectious symptoms or recent illnesses.  Today: Breathing is stable, appears much more stable versus the last visit we had together (see notes from 02/08/19). Now on home O2 at night and with exertion as needed. Also following with Dr. [name] closely and has changed her IPF regimen. She still is short of breath with exertion but know her limits.  Denies chest pain. No PND, orthopnea, LE edema or unexpected weight gain. No syncope. Sleeping well, much better with nighttime O2. Rare palpitations, manageable.  Unintentional weight loss of 7-8 lbs in the last year. Had recent colonoscopy after positive cologard test, which was fine. Trying to eat better, weight has stabilized to improved slightly.   She is  concerned as she lost 2 siblings to IPF, wonders if it is nearing her time. She feels grateful that her son's testing recently was more consistent with mild COPD and not IPF. She isn't worried about end of life, has discussed her wishes with her family.   Past Medical History:  Diagnosis Date  . Anginal pain ([place])   . Cancer ([place])   . CHF (congestive heart failure) ([place]) 01/22/2012  . Chronic fatigue 07/15/2014  . CVA (cerebral vascular accident) ([place]) 04/18/2018  . Family history of pulmonary fibrosis   . GERD (gastroesophageal reflux disease) 05/12/2017  . Hypertension   . Hypothyroidism   . ILD (interstitial lung disease) ([place]) 02/04/2012  . IPF (idiopathic pulmonary fibrosis) ([place]) 2013  . Mild intermittent asthma 01/29/2016  . Pelvic relaxation   . Prolapse of bladder   . Shortness of breath   . Thyroid disease     Past Surgical History:  Procedure Laterality Date  . ANTERIOR AND POSTERIOR REPAIR  12/05/2011   Procedure: ANTERIOR (CYSTOCELE) AND POSTERIOR REPAIR (RECTOCELE);  Surgeon: [name], MD;  Location: [facility];  Service: Gynecology;  Laterality: N/A;  with cysto  . Bladder tact  15 years ago  . COLONOSCOPY WITH PROPOFOL N/A 04/20/2019   Procedure: COLONOSCOPY WITH PROPOFOL;  Surgeon: [name], MD;  Location: [facility] ENDOSCOPY;  Service: Gastroenterology;  Laterality: N/A;  . LEFT HEART CATHETERIZATION WITH CORONARY ANGIOGRAM N/A 01/22/2012   Procedure: LEFT HEART CATHETERIZATION WITH CORONARY ANGIOGRAM;  Surgeon: [name], MD;  Location:  [facility];  Service: Cardiovascular;  Laterality: N/A;  . NECK SURGERY  2010  . SHOULDER SURGERY    . thyroid disease    . THYROID LOBECTOMY Left 07/17/2018   Procedure: LEFT THYROID LOBECTOMY;  Surgeon: [name], MD;  Location: [facility];  Service: General;  Laterality: Left;  . TONSILLECTOMY    . VAGINAL HYSTERECTOMY  15 years ago    Current Medications: Current Outpatient Medications on File Prior to Visit  Medication Sig   . acetaminophen (TYLENOL) 500 MG tablet Take 1,000 mg by mouth every 6 (six) hours as needed for mild pain or headache.   [name] amLODipine (NORVASC) 5 MG tablet TAKE 1 TABLET BY MOUTH EVERY DAY FOR BLOOD PRESSURE  . atorvastatin (LIPITOR) 40 MG tablet Take 1 tablet (40 mg total) by mouth at bedtime. For cholesterol.  [name] CALCIUM CARBONATE-VITAMIN D PO Take 1 tablet by mouth daily.   . clopidogrel (PLAVIX) 75 MG tablet Take 1 tablet (75 mg total) by mouth daily. For stroke prevention.  [name] CRANBERRY PO Take 1 capsule by mouth every Monday, Wednesday, and Friday.   . ferrous sulfate 325 (65 FE) MG tablet Take 325 mg by mouth daily.  [name] levothyroxine (SYNTHROID) 100 MCG tablet Take 1 tablet by mouth every morning on an empty stomach. No food or other medications for 30 minutes.  [name] loratadine (CLARITIN) 10 MG tablet Take 10 mg by mouth daily.   . metoprolol succinate (TOPROL-XL) 25 MG 24 hr tablet Take 1 tablet (25 mg total) by mouth daily.  . Multiple Vitamins-Minerals (CENTRUM SILVER PO) Take 1 tablet by mouth daily.  [name] OFEV 150 MG CAPS 150 mg. As Directed  . omeprazole (PRILOSEC) 20 MG capsule TAKE 1 CAPSULE BY MOUTH EVERY DAY   No current facility-administered medications on file prior to visit.      Allergies:   Dilaudid [hydromorphone] and Crestor [rosuvastatin calcium]   Social History   Tobacco Use  . Smoking status: Never Smoker  . Smokeless tobacco: Never Used  Substance Use Topics  . Alcohol use: No  . Drug use: No    Family History: The patient's family history includes Aneurysm in her father; COPD in her son; Coronary artery disease in her mother; Dementia in her maternal grandmother and mother; Heart attack (age of onset: 28) in her maternal grandfather; Hypertension in her mother; Liver disease in her brother; Pulmonary fibrosis in her brother and sister; Sleep apnea in an other family member; Stroke in her maternal grandmother, mother, and another family member; Thyroid disease in  her mother.  ROS:   Constitutional: Negative for chills, fever, night sweats. Positive for stabilizing unintentional weight loss. Positive for malaise/fatigue HENT: Negative for ear pain and hearing loss.   Eyes: Negative for loss of vision and eye pain.  Respiratory: Positive for shortness of breath, dry cough  Cardiovascular: See HPI. Gastrointestinal: Negative for abdominal pain, melena, and hematochezia.  Genitourinary: Negative for dysuria and hematuria.  Musculoskeletal: Negative for falls and myalgias.  Skin: Negative for itching and rash.  Neurological: Negative for focal weakness, focal sensory changes and loss of consciousness.  Endo/Heme/Allergies: Does not bruise/bleed easily.   EKGs/Labs/Other Studies Reviewed:    The following studies were reviewed today: MPI 2018-04-22  Nuclear stress EF: 68%.  Blood pressure demonstrated a normal response to exercise.  There was no ST segment deviation noted during stress.  The study is normal.  This is a low risk study.  The left ventricular ejection fraction is  hyperdynamic (>65%).  Echo 04/19/18 - Left ventricle: The cavity size was normal. Wall thickness was   increased in a pattern of mild LVH. Systolic function was   vigorous. The estimated ejection fraction was in the range of 65%   to 70%. Wall motion was normal; there were no regional wall   motion abnormalities. Doppler parameters are consistent with   abnormal left ventricular relaxation (grade 1 diastolic   dysfunction). Doppler parameters are consistent with   indeterminate ventricular filling pressure. - Aortic valve: Mildly calcified annulus. Trileaflet. - Mitral valve: Mildly calcified annulus. - Left atrium: The atrium was mildly dilated. - Atrial septum: No defect or patent foramen ovale was identified. - Tricuspid valve: There was moderate regurgitation. - Pulmonary arteries: PA peak pressure: 63 mm Hg (S). - Pericardium, extracardiac: A trivial  pericardial effusion was   identified.  Echo 01/31/18 Study Conclusions  - Left ventricle: The cavity size was normal. Wall thickness was   normal. Systolic function was normal. The estimated ejection   fraction was in the range of 60% to 65%. Wall motion was normal;   there were no regional wall motion abnormalities. Doppler   parameters are consistent with abnormal left ventricular   relaxation (grade 1 diastolic dysfunction). - Aortic valve: Mildly calcified annulus. Normal thickness   leaflets. Valve area (VTI): 1.83 cm^2. Valve area (Vmax): 2.06   cm^2. Valve area (Vmean): 1.74 cm^2. - Mitral valve: Mildly calcified annulus. Normal thickness leaflets   . - Left atrium: The atrium was severely dilated. - Right ventricle: The cavity size was mildly dilated. - Technically adequate study.  Elevated RA pressures, RV-RA gradient 47 mmHg.   Monitor 01/29/18 Ventricular tachycardia-nonsustained-up to 5 beats Atrial tachycardia-37 episodes    Symptoms of fluttering sinus rhythm;                              rare PAC                             Isolated PVC Symptoms of shortness of breath PACs/PVCs  Long term monitor reviewed. All of her patient triggered events are sinus rhythm, occasionally tachycardia, rare isolated ectopy. She had one episode of 5 beats of NSVT without symptoms, and she had one brief episode of 3 beats of SVT. Rest are isolated atrial ectopy and rare PVCs.  CT angio chest 01/12/18 IMPRESSION: 1. No acute findings. No pulmonary embolism seen. No evidence of pneumonia or pulmonary edema. 2. 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 3. Large hiatal hernia, stable. 4. Cardiomegaly. 5. Coronary artery calcifications.  EKG:  EKG personally reviewed,  ECG from 02/08/19 is NSR with PRWP  Recent Labs: 12/03/2018: TSH 0.79 12/31/2018: BUN 18;  Creatinine, Ser 0.92; Potassium 4.6; Sodium 138 03/01/2019: Hemoglobin 12.0; Platelets 192 04/29/2019: ALT 18  Recent Lipid Panel    Component Value Date/Time   CHOL 137 12/31/2018 1412   TRIG 162.0 (H) 12/31/2018 1412   HDL 51.20 12/31/2018 1412   CHOLHDL 3 12/31/2018 1412   VLDL 32.4 12/31/2018 1412   LDLCALC 54 12/31/2018 1412    Physical Exam:    VS:  BP 108/72   Pulse 100   Temp (!) 97.1 F (36.2 C)   Ht 5' 2.5" (1.588 m)   Wt 126 lb 6.4 oz (  57.3 kg)   SpO2 96%   BMI 22.75 kg/m     Wt Readings from Last 3 Encounters:  05/18/19 126 lb 6.4 oz (57.3 kg)  04/29/19 123 lb 3.2 oz (55.9 kg)  04/20/19 127 lb (57.6 kg)    GEN: frail elderly woman in NAD HEENT: Normal, moist mucous membranes NECK: No JVD CARDIAC: regular rhythm, normal S1 and S2, no rubs or gallops. No murmur. VASCULAR: Radial and DP pulses 2+ bilaterally. No carotid bruits RESPIRATORY:  velcro crackles in mid to lower lungs bilaterally  ABDOMEN: Soft, non-tender, non-distended MUSCULOSKELETAL:  Ambulates independently SKIN: Warm and dry, no edema NEUROLOGIC:  Alert and oriented x 3. No focal neuro deficits noted. PSYCHIATRIC:  Normal affect   ASSESSMENT:    1. Shortness of breath   2. IPF (idiopathic pulmonary fibrosis) (HCC)   3. Heart palpitations   4. Essential hypertension   5. History of CVA (cerebrovascular accident)   6. Counseling on health promotion and disease prevention    PLAN:    Shortness of breath: due to her IPF. Much better since her last visit with me. Now on home O2 at night and as needed with exertion. She tries to remain active but knows her limits. Improves with resting -nuclear stress test without ischemia -follows closely with Dr. [name] -had genetic testing done, awaiting results  palpitations/tachycardia:  -well controlled on metoprolol without worsening her breathing  Hypertension: -at goal today -continue amlodipine, metoprolol.  Secondary prevention  (CVA): -recommend heart healthy/Mediterranean diet, with whole grains, fruits, vegetable, fish, lean meats, nuts, and olive oil. Limit salt. -recommend moderate walking, 3-5 times/week for 30-50 minutes each session. Aim for at least 150 minutes.week. Goal should be pace of 3 miles/hours, or walking 1.5 miles in 30 minutes -recommend avoidance of tobacco products. Avoid excess alcohol. -doing well on clopidogrel and atorvastatin  Plan for follow up: 6 mos or sooner PRN  TIME SPENT WITH PATIENT: 26 minutes of direct patient care. More than 50% of that time was spent on coordination of care and counseling regarding symptoms, cardiac portion of her chronic conditions, medication review.  Buford Dresser, MD, PhD [place]  [facility]   Medication Adjustments/Labs and Tests Ordered: Current medicines are reviewed at length with the patient today.  Concerns regarding medicines are outlined above.  No orders of the defined types were placed in this encounter.  No orders of the defined types were placed in this encounter.   Patient Instructions  Medication Instructions:  Your Physician recommend you continue on your current medication as directed.    *If you need a refill on your cardiac medications before your next appointment, please call your pharmacy*  Lab Work: None  Testing/Procedures: None  Follow-Up: At [facility], you and your health needs are our priority.  As part of our continuing mission to provide you with exceptional heart care, we have created designated Provider Care Teams.  These Care Teams include your primary Cardiologist (physician) and Advanced Practice Providers (APPs -  Physician Assistants and Nurse Practitioners) who all work together to provide you with the care you need, when you need it.  Your next appointment:   6 month(s)  The format for your next appointment:   In Person  Provider:   Buford Dresser, MD     Signed, Buford Dresser, MD PhD 05/18/2019 1:07 PM    [place]

## 2019-05-18 NOTE — Patient Instructions (Signed)
Medication Instructions:  Your Physician recommend you continue on your current medication as directed.    *If you need a refill on your cardiac medications before your next appointment, please call your pharmacy*  Lab Work: None  Testing/Procedures: None  Follow-Up: At [facility], you and your health needs are our priority.  As part of our continuing mission to provide you with exceptional heart care, we have created designated Provider Care Teams.  These Care Teams include your primary Cardiologist (physician) and Advanced Practice Providers (APPs -  Physician Assistants and Nurse Practitioners) who all work together to provide you with the care you need, when you need it.  Your next appointment:   6 month(s)  The format for your next appointment:   In Person  Provider:   Buford Dresser, MD

## 2019-05-21 ENCOUNTER — Other Ambulatory Visit: Payer: Self-pay | Admitting: Primary Care

## 2019-05-21 DIAGNOSIS — E890 Postprocedural hypothyroidism: Secondary | ICD-10-CM

## 2019-05-21 DIAGNOSIS — I10 Essential (primary) hypertension: Secondary | ICD-10-CM

## 2019-05-24 ENCOUNTER — Other Ambulatory Visit (INDEPENDENT_AMBULATORY_CARE_PROVIDER_SITE_OTHER): Payer: Medicare Other

## 2019-05-24 ENCOUNTER — Telehealth: Payer: Self-pay | Admitting: Pulmonary Disease

## 2019-05-24 DIAGNOSIS — Z5181 Encounter for therapeutic drug level monitoring: Secondary | ICD-10-CM | POA: Diagnosis not present

## 2019-05-24 LAB — HEPATIC FUNCTION PANEL
ALT: 22 U/L (ref 0–35)
AST: 26 U/L (ref 0–37)
Albumin: 4.1 g/dL (ref 3.5–5.2)
Alkaline Phosphatase: 60 U/L (ref 39–117)
Bilirubin, Direct: 0.2 mg/dL (ref 0.0–0.3)
Total Bilirubin: 0.6 mg/dL (ref 0.2–1.2)
Total Protein: 6.8 g/dL (ref 6.0–8.3)

## 2019-05-24 NOTE — Telephone Encounter (Signed)
Spoke with patient about results. She verbalized understanding. She wishes to keep her televisit with MR for this Friday.   Nothing further needed at time of call.

## 2019-05-26 ENCOUNTER — Other Ambulatory Visit: Payer: Self-pay | Admitting: Primary Care

## 2019-05-26 DIAGNOSIS — I6300 Cerebral infarction due to thrombosis of unspecified precerebral artery: Secondary | ICD-10-CM

## 2019-05-27 ENCOUNTER — Ambulatory Visit: Payer: Medicare Other | Admitting: Oncology

## 2019-05-27 ENCOUNTER — Other Ambulatory Visit: Payer: Medicare Other

## 2019-05-27 NOTE — Telephone Encounter (Signed)
Refill or cardio?

## 2019-05-28 ENCOUNTER — Ambulatory Visit: Payer: Medicare Other | Admitting: Internal Medicine

## 2019-05-28 ENCOUNTER — Ambulatory Visit (INDEPENDENT_AMBULATORY_CARE_PROVIDER_SITE_OTHER): Payer: Medicare Other | Admitting: Internal Medicine

## 2019-05-28 DIAGNOSIS — R634 Abnormal weight loss: Secondary | ICD-10-CM

## 2019-05-28 DIAGNOSIS — Z5181 Encounter for therapeutic drug level monitoring: Secondary | ICD-10-CM

## 2019-05-28 DIAGNOSIS — J9611 Chronic respiratory failure with hypoxia: Secondary | ICD-10-CM

## 2019-05-28 DIAGNOSIS — J84112 Idiopathic pulmonary fibrosis: Secondary | ICD-10-CM | POA: Diagnosis not present

## 2019-05-28 NOTE — Patient Instructions (Addendum)
ICD-10-CM   1. 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 Chronic respiratory failure with hypoxia (HCC)  J96.11   4. Therapeutic drug monitoring  Z51.81   5. Weight loss  R63.4      IPF:   - Stable.  - glad went to genetic counseling - glad son does not have ILD as of now   Plan  - continue ofev - repeat PFT - 07/08/2019 at [facility] (cancel 1/18 at [facility]) - covid-19 vaccine encouraged - cosndier Promed[employer] trial (we discussed this) in future  LFT monitoring with ofev  - LFT normal  Plan  - repeat LFT in 4-6 weeks - see if [facility] can do [facility] at [facility]   Weight loss ? ofev related  Plan  - monitor  Followup  - 30 min  Phone visit or face to face - in 4-6 weeks - (Dr [name] can do this call from any of his locations but if face to face then will be [place] [facility]  - patient chocie)

## 2019-05-28 NOTE — Progress Notes (Signed)
78 yo female never smoker seen for initial pulmonary consult during hospital for acute respiratory failure   [facility] follow up 01/30/12 -  Pt was admitted 2 weeks ago for chest pain and dyspnea.  Found to have Acute respiratory failure with hypoxia--  Tx for possible diastolic CHF with aggressive diuresis Echo showed EF 60% , no mention of diastolic dysfxn.  HRCT consistent with ILD-  CCM consult with autoimmune workup that was unrevealing with neg ANA, ANCA, RA factor , nml ESR , CRP    Does light housework but not able walk long distances due to dyspnea  FH of pulmonary fibrosis -sibling -they were smokers. NO significant desats with walking in cardiology yesterday, desated to 93% on room air.  No cough or wheezing  No edema.   Since discharge she is feeling better with less DOE. But does get winded with walking long distances.   ROV 02/21/12 -- follows up for ILD noted during hospitalization for resp failure. Treated for possible diastolic CHF. CT scan shows some mild basilar fibrotic change. PFT's 9/3 >> probable mixed disease, no BD response, restricted volumes, decrease DLCO that corrects for Va.  She had family hx of ILD.  She had walking oximetry that she says was reassuring. She feels no better today than at hospital discharge.   ROV 03/24/12 -- ILD without clear etiology, mixed dz on PFT 02/11/12. Initiated a trial prednisone last month >> She doesn't believe that her dyspnea has changed.  Last time she did not desat.   ROV 06/23/12 -- ILD without clear etiology, mixed dz on PFT 02/11/12. Trial of pred >> weaned to off. Sent her to pulm rehab at [place], feels that it has benefited her. She isn't as SOB as before. CXR today >> stable bibasilar ILD, hiatal hernia.   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 Also possible component diastolic dysfxn. She has a family hx of ILD.  She tells me that her dyspnea is worse compared with last year. She is having some spells of near syncope on exertion. Walked today to exhaustion without desaturation.   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 She has been part of the pirfenidone trial and has been on the medication. She will be transitioning to the commercial drug. Denies any side effects from the medication. I completed financial assistance forms on 04/12/13.  Due for CXR today. Not really doing exercise - she did do pulmonary rehab at [facility].    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 Last CXR 05/19/13, last CT scan 01/24/12. She is still on the pirfenidone trial, receiving study drug. She is fairly stable. Will be converted to the commercial med soon. Tolerates soon.   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 Returns for f/u. She is now on pirfenidone 3 pills tid and is tolerating. She is having more problems with fatigue and dyspnea with exertion - has lay down after eating dinner, with chores. Her synthroid hasn't been adjusted in a long time.  Her last labs were March 3, 15.   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 She is on pirfenidone 3 pills tid. She has been tolerating well from GI standpoint. TSH normal. She has some fatigue, otherwise doing  well. She needs LFT done in 3 months and after that we can go to every 6 months,.   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 On pirfenidone 3 pills tid since August 2014. Last CT scan 5/'15 was stable, described as an NSIP pattern.  LFT were done today, not yet available. She has noticed some slightly raised pale pink macules, not pruritic, since last month. She has  been careful to wear sunscreen.    ROV 12/01/17 --this follow-up visit for patient with a history of idiopathic interstitial lung disease, some coexisting obstruction based on her spirometry.  She also has a hiatal hernia with GERD and chronic cough.  We have managed her on pirfenidone.  At our last visit she noted that she is had some decreased functional capacity, more dyspnea with exertion. She is still having decreased energy, more fatigue with activity. We checked CBC and TSH, both reassuring. She keeps a slight cough, added chlorpheniramine to loratadine. Remains on omeprazole.    She underwent a 6-minute walk test today and was able to walk 462 m without desaturation.  Her last in 04/2016 was 452mRepeat CT chest from 11/28/2017 reviewed and shows stable interstitial changes compared with her most recent from 2017.  There is patchy groundglass attenuation with associated septal thickening and peripheral bronchial ectasis without overt honeycomb change.  No effusion, no suspicious nodules.   ROV 06/18/18 --Ms. Denicola is 89 followed for history of idiopathic interstitial lung disease and some coexisting obstruction noted on spirometry.  She also has a hiatal hernia with GERD and some associated chronic cough.  We have been managing her on pirfenidone - no side effects.  She is on omeprazole, loratadine, chlorpheniramine.  Since our last visit she was hospitalized for a stroke, has some residual word-finding trouble. She is scheduled for nerve conduction studies after some falls and leg weakness. She is also being evaluated for possible thyroid surgery with Dr. GHarlow Asa She had a CT chest done 01/12/18 that I reviewed, shows no real change in NSIP pattern, large HH, no new infiltrates. Overall reports that breathing is stable, not limiting.   ROV 12/03/2018 --pleasant 78year old woman with mixed lung disease.  She has mild obstruction and restriction in the setting of a hiatal hernia and idiopathic  interstitial lung disease that would characterizes IPF.  We have been managing her on pirfenidone.  Also with a history of GERD on Prilosec, allergic rhinitis on loratadine. Last LFT in 05/2018 were normal. Last Ct chest was 01/2018. She is having progressive exertional SOB compared with last time. She has to rest doing housework, taking out the trash. She has to stop to rest. Not on BD's, didn't benefit. She had thyroid sgy in 07/2018, just had her synthroid increased in 07/2018. TSH was 3.05 5/26.     OV 02/25/2019  Subjective:  Patient ID: NRutherford Limerick female , DOB: [birth year], age 78y.o. , MRN: [id number], [place] [id number] [id number]  02/25/2019 -   Chief Complaint  Patient presents with   IPF (idiopathic pulmonary fibrosis)    Had 6 minute walk yesterday.     Clinical IPF patient  - On Esbreit since aug 2014 initially via  expanded access program   - Autoimmune panel 2013 and 2014 is negative and normal   - Pulmonary function test 02/15/2014 postbronchodilator FVC 1.55 L/  59%, total lung capacity 3.05 L/64%, DLCO 14.3/66%  - CT  chest  May 2015 - NSIP per radioolgy  - Trial participagnt early 08/02/2014 for 6 months IPF - Phase 4 study - got esbriet and 6 months of add-on ofev   HPI Maeola Mchaney 78 y.o. -has now been referred to the ILD clinic.  This is because she has had progression in her symptoms.  She says for the last few months she has had progressive shortness of breath but no worsening cough or pedal edema or hemoptysis or proximal nocturnal dyspnea.  She is tolerating the Esbriet well since August 2014 when she was in the expanded access program.  Overall she is stable and she is active in the pulmonary fibrosis foundation support group but in the last few months is progressive dyspnea.  Nurse practitioner did a high-resolution CT scan of the chest and it shows progression although the pattern is still indeterminate for UIP.  In addition  6-minute walk test last week she had new onset desaturation and has been started on portable oxygen which is helping.  However she does not have nighttime oxygen.  There is a spirometry that is upcoming.  Review of the spirometry is between 2013/08/02 and 08-03-2015 show clear progression in both FVC and DLCO.  Previously serology was negative in 20 13/2014 but she says she might have Raynaud Echocardiogram November 2019 with normal ejection fraction and grade 1 diastolic dysfunction  [place] Integrated Comprehensive ILD Questionnaire  Symptoms: -ILD diagnosed in 20 14/2015.  Which is gradually worse in the last several months.  She is having level for shortness of breath the household work shopping and walking on level ground at her own pace.  She does not think any other condition is limiting her.  No cough.  No wheezing   Past Medical History : Negative for collagen vascular disease acid reflux but positive for previous stroke in November 2019 and thyroid disease.  Detailed past medical history is negative otherwise   ROS: Positive for fatigue   FAMILY HISTORY of LUNG DISEASE: Her sister died in 88 from pulmonary fibrosis and a brother died in Aug 02, 2004 from pulmonary fibrosis   EXPOSURE HISTORY: No smoking no cigars.  No vaping no marijuana.  No cocaine.  No intravenous drug use.   HOME and HOBBY DETAILS : Lives in an apartment.  No mold exposure no mildew exposure.  Extensive home exposure history is negative.   OCCUPATIONAL HISTORY (122 questions) : Positive for gardening but otherwise negative.   PULMONARY TOXICITY HISTORY (27 items): Intermittent prednisone burst otherwise negative.     Results for CASHLYNN, YEARWOOD (MRN [id number]) as of 02/25/2019 11:43  Ref. Range 02/15/2014 11:51 07/15/2014 11:47 01/03/2015 09:40 03/17/2015 13:03 10/23/2015 08:45  FVC-Pre Latest Units: L 1.58 1.59 1.52 1.47 1.44   Results for ARLEATHA, PHILIPPS (MRN [id number]) as of 02/25/2019 11:43  Ref. Range 02/15/2014  11:51 07/15/2014 11:47 01/03/2015 09:40 03/17/2015 13:03 10/23/2015 08:45  DLCO unc Latest Units: ml/min/mmHg 14.30 13.65 13.66 12.11 12.65  DLCO unc % pred Latest Units: % 66 63 63 56 58    CT chest high resolution Aug 2020 -personally visualized  standard protocol without intravenous contrast. High resolution imaging of the lungs, as well as inspiratory and expiratory imaging, was performed.  COMPARISON:  11/28/2017, 10/09/2015, 01/24/2012  FINDINGS: Cardiovascular: Aortic atherosclerosis. Cardiomegaly. Left coronary artery calcifications. No pericardial effusion.  Mediastinum/Nodes: No enlarged mediastinal, hilar, or axillary lymph nodes. Large hiatal hernia with intrathoracic position of the gastric body and fundus. Thyroid gland, trachea, and esophagus demonstrate  no significant findings.  Lungs/Pleura: Redemonstrated moderate pulmonary fibrosis in a pattern with an apical to basal gradient, featuring peripheral interstitial opacity and some suggestion of nodularity, mild traction bronchiectasis, and occasional areas of subpleural bronchiolectasis in the bases. There is no evidence of honeycombing. Moderate lobular air trapping on expiratory phase imaging. Findings are not significantly changed when compared to immediate prior examination dated 11/28/2017, however are slightly worsened over time on comparison to multiple prior examinations dating back to at least 01/24/2012. No pleural effusion or pneumothorax.  Upper Abdomen: No acute abnormality.  Musculoskeletal: No chest wall mass or suspicious bone lesions identified.  IMPRESSION: 1. Redemonstrated moderate pulmonary fibrosis in a pattern with an apical to basal gradient, featuring peripheral interstitial opacity and some suggestion of nodularity, mild traction bronchiectasis, and occasional areas of subpleural bronchiolectasis in the bases. There is no evidence of honeycombing. Moderate lobular air trapping  on expiratory phase imaging. Findings are not significantly changed when compared to immediate prior examination dated 11/28/2017, however are slightly worsened over time on comparison to multiple prior examinations dating back to at least 01/24/2012. Findings remain indeterminate for UIP and differential considerations include both UIP and chronic fibrosing NSIP.  2.  Coronary artery disease and aortic atherosclerosis.  3.  Hiatal hernia.   Electronically Signed   By: Eddie Candle M.D.   On: 01/15/2019 12:09  ROS - per HPI Recent Labs  Lab 02/23/19 1153  AST 20  ALT 13  ALKPHOS 47  BILITOT 0.3  PROT 6.8  ALBUMIN 4.1     OV 04/08/2019  Subjective:  Patient ID: [name], female , DOB: Apr 11, 1941 , age 31 y.o. , MRN: [id number] , ADDRESS: [id number] [place] [address]   04/08/2019 -   Chief Complaint  Patient presents with   Follow-up    Patient reports sob with exertion and a mild cough.     Clinical IPF patient    - strong family hx of ILD (brother died age 68, sister died in her 38s)  - Autoimmune panel 2013 and 2014 is negative and normal   - Pulmonary function test 02/15/2014 postbronchodilator FVC 1.55 L/  59%, total lung capacity 3.05 L/64%, DLCO 14.3/66%  - CT chest  May 2015 - NSIP per radioolgy  - Rx  - - On Esbreit since aug 2014 initially via  expanded access program (Trial participagnt early 2016 for 6 months IPF - Phase 4 study - got esbriet and 6 months of add-on ofev)  - Switched to ofev due to progessive disease mid-oct 2020 (03/24/2019) - started on portable o2 sept 2020   HPI Waunetta Riggle [place] 78 y.o. - returns for followup following starting ofev 2 weeks ago. Therapy was changed due to progressive disease. She is tolerating nintedanib fine.  The couple of episodes of mild diarrhea that she had.  One time she had vomiting.  This is following a a family gathering.  Other than that she is tolerating nintedanib just  fine.  In terms of her ILD even though it is progressed compared to last visit in September she feels she is stable.  She is having significant cough.  She says vitamin C helps this.  She is using portable oxygen as needed.  She wants a lighter tank.  She has had overnight oxygen study with the results are still pending this was done a few days ago.  She reminded me about the strong family history of ILD that she has.  Her  brother died at age 55 7 years ago her sister died in her 81s also from ILD.  She is interested in seeing the Retail buyer.  I have sent email to genetics counselor Roma Kayser within our health system.    ROS   OV 04/29/2019  Subjective:  Patient ID: [name], female , DOB: 1940-07-23 , age 37 y.o. , MRN: [id number] , ADDRESS: [id number] [place] [address]   04/29/2019 -   Chief Complaint  Patient presents with   Follow-up    Pt states she has been doing well since last visit and denies any complaints.    Follow-up idiopathic pulmonary fibrosis progressive now on nintedanib since mid October 2020. HPI Karina Nofsinger [facility] 78 y.o. -returns for follow-up.  She is returning sooner than anticipated.  I saw her 3 weeks ago.  I wanted her back in 4-6 weeks to see how the nintedanib is going.  Due to scheduling issues she comes back in 3 weeks.  She tells me there are no new issues.  She is losing weight.  She did have interim colonoscopy which she says was normal.  She is unsure why she is losing weight.  I explained to her that this could be because of IPF or could be due to previous pirfenidone or current nintedanib.  At this point in time she wants to take the drugs and continue to monitor his situation.  Last liver function test 3 weeks ago was normal.  She is now on 2 L nasal cannula at night since end of October 2020.  She is also using oxygen with exertion.  She wants a light portable system.  She has called [facility].  She wants our  support in getting a lightweight portable oxygen system.     OV 05/28/2019  Subjective:  Patient ID: [name], female , DOB: 1941/02/10 , age 55 y.o. , MRN: [id number] , ADDRESS: [id number] [place] [address]   05/28/2019 -   Chief Complaint  Patient presents with   Follow-up    pt reports of sob with exertion. no new concerns or sx.  on 3L at night and 2L during the day PRN.      Follow-up idiopathic pulmonary fibrosis progressive now on nintedanib since mid October 2020.   HPI Raelin Pixler Reitman 78 y.o. - 3L night and 2L night prn in day. Normal plse ox Room air at rest. Tolerting ofev well. No diarrhea. No issue swith appetite. No Nausea, vomitting. Lost some weight 122.4# . She lives < 5 miles Dyspnea is stable. She wants portable o2 - but she says [place] at [facility] office has not called her back. [facility] was supposed to be the provider Though on subjective score below she is worse but she tells me she  Is stable . LFT is normal \  Recent Labs  Lab 05/24/19 1029  AST 26  ALT 22  ALKPHOS 60  BILITOT 0.6  PROT 6.8  ALBUMIN 4.1       SYMPTOM SCALE - ILD 04/08/2019 126# 04/29/2019 123# 05/28/2019 122#  O2 use Portable o2 as needed    Shortness of Breath 0 -> 5 scale with 5 being worst (score 6 If unable to do)    At rest _0 Simple tasks - showers, clothes change, eating, shaving _1 Household (dishes, doing bed, laundry) _2 Shopping 1 3   Walking level at  own pace 1 3   Walking keeping up with others of same age 45 3.5   Walking up Stairs 6 Do not do   Walking up Hill 6 Do not do   Total (40 - 48) Dyspnea Score 19    How bad is your cough? 1 3   How bad is your fatigue 2 4        Simple office walk 185 feet x  3 laps goal with forehead probe 04/08/2019  04/29/2019   O2 used RA RA  Number laps completed 3   3  Comments about pace - stopped at 2 Sopped at end of 2nd lap  Resting Pulse Ox/HR 96% and 104/min 95% and  HR 80.min  Final Pulse Ox/HR 82% and 120/min 85% and 108/min   Desaturated </= 88% yes yes  Desaturated <= 3% points yes yes  Got Tachycardic >/= 90/min yes yes  Symptoms at end of test Very dyspneice Moderate dyspnea  Miscellaneous comments Maris Berger mid oct 2020        Results for DEBRIA, BROECKER [facility] (MRN [id number]) as of 04/08/2019 11:19  Ref. Range 07/15/2014 11:47 01/03/2015 09:40 03/17/2015 13:03 10/23/2015 08:45 03/12/2019 16:47 - startr ofev due to progerssiv diseae  FVC-Pre Latest Units: L 1.59 1.52 1.47 1.44 1.08  FVC-%Pred-Pre Latest Units: % 61 59 57 56 44  Results for JAYLANI, MCGUINN (MRN [id number]) as of 04/08/2019 11:19  Ref. Range 02/15/2014 11:51 07/15/2014 11:47 01/03/2015 09:40 03/17/2015 13:03 10/23/2015 08:45 03/12/2019 16:47 - start ofev  DLCO unc Latest Units: ml/min/mmHg 14.30 13.65 13.66 12.11 12.65 8.71  DLCO unc % pred Latest Units: % 66 63 63 56 58 49       ROS - per HPI     has a past medical history of Anginal pain ([place]), Cancer ([place]), CHF (congestive heart failure) ([place]) (01/22/2012), Chronic fatigue (07/15/2014), CVA (cerebral vascular accident) ([place]) (04/18/2018), Family history of pulmonary fibrosis, GERD (gastroesophageal reflux disease) (05/12/2017), Hypertension, Hypothyroidism, ILD (interstitial lung disease) ([place]) (02/04/2012), IPF (idiopathic pulmonary fibrosis) ([place]) (2013), Mild intermittent asthma (01/29/2016), Pelvic relaxation, Prolapse of bladder, Shortness of breath, and Thyroid disease.   reports that she has never smoked. She has never used smokeless tobacco.  Past Surgical History:  Procedure Laterality Date   ANTERIOR AND POSTERIOR REPAIR  12/05/2011   Procedure: ANTERIOR (CYSTOCELE) AND POSTERIOR REPAIR (RECTOCELE);  Surgeon: [name], MD;  Location: [facility];  Service: Gynecology;  Laterality: N/A;  with cysto   Bladder tact  15 years ago   COLONOSCOPY WITH PROPOFOL N/A 04/20/2019   Procedure: COLONOSCOPY WITH PROPOFOL;   Surgeon: [name], MD;  Location: [facility]OSCOPY;  Service: Gastroenterology;  Laterality: N/A;   LEFT HEART CATHETERIZATION WITH CORONARY ANGIOGRAM N/A 01/22/2012   Procedure: LEFT HEART CATHETERIZATION WITH CORONARY ANGIOGRAM;  Surgeon: [name], MD;  Location: [facility] CATH LAB;  Service: Cardiovascular;  Laterality: N/A;   NECK SURGERY  2010   SHOULDER SURGERY     thyroid disease     THYROID LOBECTOMY Left 07/17/2018   Procedure: LEFT THYROID LOBECTOMY;  Surgeon: [name], MD;  Location: [facility];  Service: General;  Laterality: Left;   TONSILLECTOMY     VAGINAL HYSTERECTOMY  15 years ago    Allergies  Allergen Reactions   Dilaudid [Hydromorphone] Nausea And Vomiting   Crestor [Rosuvastatin Calcium] Other (See Comments)    Myalgias, muscle weakness    Immunization History  Administered Date(s) Administered   Influenza Split 02/22/2012, 03/01/2013, 02/08/2014, 01/27/2015  Influenza Whole 04/11/2011, 02/10/2017   Influenza, High Dose Seasonal PF 02/16/2017, 01/20/2019   Influenza,inj,Quad PF,6+ Mos 03/11/2016   Influenza-Unspecified 01/26/2018   Pneumococcal Conjugate-13 03/29/2014   Pneumococcal Polysaccharide-23 12/08/2017   Pneumococcal-Unspecified 04/11/2011, 05/13/2016   Td 08/30/2008   Tdap 11/16/2017   Zoster 08/07/2011   Zoster Recombinat (Shingrix) 09/20/2016, 02/19/2017    Family History  Problem Relation Age of Onset   Thyroid disease Mother    Hypertension Mother    Coronary artery disease Mother    Dementia Mother    Stroke Mother    Aneurysm Father    Pulmonary fibrosis Sister    Stroke Other    Sleep apnea Other    Liver disease Brother    Pulmonary fibrosis Brother    Stroke Maternal Grandmother    Dementia Maternal Grandmother    Heart attack Maternal Grandfather 67   COPD Son      Current Outpatient Medications:    acetaminophen (TYLENOL) 500 MG tablet, Take 1,000 mg by mouth every 6 (six) hours as  needed for mild pain or headache. , Disp: , Rfl:    amLODipine (NORVASC) 5 MG tablet, TAKE 1 TABLET BY MOUTH EVERY DAY FOR BLOOD PRESSURE, Disp: 90 tablet, Rfl: 1   atorvastatin (LIPITOR) 40 MG tablet, Take 1 tablet (40 mg total) by mouth at bedtime. For cholesterol., Disp: 90 tablet, Rfl: 3   CALCIUM CARBONATE-VITAMIN D PO, Take 1 tablet by mouth daily. , Disp: , Rfl:    clopidogrel (PLAVIX) 75 MG tablet, Take 1 tablet (75 mg total) by mouth daily. For stroke prevention., Disp: 90 tablet, Rfl: 3   CRANBERRY PO, Take 1 capsule by mouth every Monday, Wednesday, and Friday. , Disp: , Rfl:    ferrous sulfate 325 (65 FE) MG tablet, Take 325 mg by mouth daily., Disp: , Rfl:    levothyroxine (SYNTHROID) 100 MCG tablet, TAKE 1 TABLET BY MOUTH EVERY MORNING ON AN EMPTY STOMACH. NO FOOD OR OTHER MEDICATIONS FOR 30 MIN, Disp: 90 tablet, Rfl: 1   loratadine (CLARITIN) 10 MG tablet, Take 10 mg by mouth daily. , Disp: , Rfl:    metoprolol succinate (TOPROL-XL) 25 MG 24 hr tablet, Take 1 tablet (25 mg total) by mouth daily., Disp: 30 tablet, Rfl: 11   Multiple Vitamins-Minerals (CENTRUM SILVER PO), Take 1 tablet by mouth daily., Disp: , Rfl:    OFEV 150 MG CAPS, 150 mg. As Directed, Disp: , Rfl:    omeprazole (PRILOSEC) 20 MG capsule, TAKE 1 CAPSULE BY MOUTH EVERY DAY, Disp: 90 capsule, Rfl: 1      Objective:   There were no vitals filed for this visit.  Estimated body mass index is 22.75 kg/m as calculated from the following:   Height as of 05/18/19: 5' 2.5" (1.588 m).   Weight as of 05/18/19: 126 lb 6.4 oz (57.3 kg).  _0 @  There were no vitals filed for this visit.  Vitals per [place] at home - > 95% on RA x 15 min, BP 130/82, HR 75/min, Weight 122#   Physical Exam      Assessment:       ICD-10-CM   1. 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  Chronic respiratory failure with hypoxia (H[facility])  J96.11   4. Therapeutic drug monitoring  Z51.81   5. Weight loss  R63.4  Plan:     Patient Instructions     ICD-10-CM   1. 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 Chronic respiratory failure with hypoxia (HCC)  J96.11   4. Therapeutic drug monitoring  Z51.81   5. Weight loss  R63.4      IPF:   - Stable.  - glad went to genetic counseling - glad son does not have ILD as of now   Plan  - continue ofev - repeat PFT - 07/08/2019 at [facility] (cancel 1/18 at [facility]) - covid-19 vaccine encouraged - cosndier Promed[employer] trial (we discussed this) in future  LFT monitoring with ofev  - LFT normal  Plan  - repeat LFT in 4-6 weeks - see if you can do this at [facility]   Weight loss ? ofev related  Plan  - monitor  Followup  - 30 min  Phone visit or face to face - in 4-6 weeks - (Dr [name] can do this call from any of his locations but if face to face then will be [place] [facility]  - patient chocie)    > 50% of this > 25 min visit spent in telephone counseling or coordination of care - by this undersigned MD - Dr Brand Males. This includes one or more of the following documented above: discussion of test results, diagnostic or treatment recommendations, prognosis, risks and benefits of management options, instructions, education, compliance or risk-factor reduction   SIGNATURE    Dr. Brand Males, M.D., F.C.C.P,  Pulmonary and Critical Care Medicine Staff Physician, [place] Director - Interstitial Lung Disease  Program  Pulmonary [place] at [place], [place], [address]  Pager: [phone], If no answer or between  15:00h - 7:00h: call [phone] Telephone: [phone]  10:04 AM 05/28/2019

## 2019-05-28 NOTE — Addendum Note (Signed)
Addended by: Maryanna Shape A on: 05/28/2019 10:26 AM   Modules accepted: Orders

## 2019-05-28 NOTE — Telephone Encounter (Signed)
Refills sent to pharmacy

## 2019-06-17 ENCOUNTER — Telehealth: Payer: Self-pay | Admitting: Pharmacy Technician

## 2019-06-17 NOTE — Telephone Encounter (Signed)
Patient has new [employer]. ID# [id number]  Submitted a Prior Authorization request to [facility] for [place] via Cover My Meds. Will update once we receive a response.  PA# [id number] RepVerdis Frederickson  Phone# [phone]

## 2019-06-28 ENCOUNTER — Encounter: Payer: Self-pay | Admitting: Pharmacist

## 2019-06-28 NOTE — Telephone Encounter (Signed)
Called patient and advised that pharmacy team is working on [employer] Patient has 2 transitional fills in the meantime to continue to get the medication. She just refilled the medication last week. Will update her once we receive a response.  11:11 AM Beatriz Chancellor, CPhT

## 2019-06-28 NOTE — Progress Notes (Signed)
Appeal letter for [place].  Fax to [phone].

## 2019-06-28 NOTE — Telephone Encounter (Signed)
Patient checking the status of the prior authorization for [place].  Patient states got a letter for denial in the mail.  Patient phone number is [phone].

## 2019-06-30 ENCOUNTER — Ambulatory Visit: Payer: Medicare PPO | Attending: Internal Medicine

## 2019-06-30 DIAGNOSIS — Z23 Encounter for immunization: Secondary | ICD-10-CM | POA: Insufficient documentation

## 2019-06-30 NOTE — Progress Notes (Signed)
   Covid-19 Vaccination Clinic  Name:  Tina Calderon    MRN: [id number] DOB: 1941-01-20  06/30/2019  [name] was observed post Covid-19 immunization for 15 minutes without incidence. She was provided with Vaccine Information Sheet and instruction to access the V-Safe system.   [name] was instructed to call 911 with any severe reactions post vaccine: [name] Difficulty breathing  . Swelling of your face and throat  . A fast heartbeat  . A bad rash all over your body  . Dizziness and weakness    Immunizations Administered    Name Date Dose VIS Date Route   Pfizer COVID-19 Vaccine 06/30/2019  8:52 AM 0.3 mL 05/21/2019 Intramuscular   Manufacturer: [employer]   Lot: [id number]   [place]: [id number]

## 2019-07-05 ENCOUNTER — Telehealth: Payer: Self-pay | Admitting: Internal Medicine

## 2019-07-05 ENCOUNTER — Telehealth: Payer: Self-pay | Admitting: Licensed Clinical Social Worker

## 2019-07-05 NOTE — Telephone Encounter (Signed)
Pt called back and I relayed message below. I gave pt time and location of covid test, pt voiced understanding.

## 2019-07-05 NOTE — Telephone Encounter (Signed)
Revealed negative genetic testing for pulmonary fibrosis.   We discussed that we do not know why she has pulmonary fibrosis or why there is pulmonary fibrosis in the family. It could be due to a different gene that we are not testing, or something our current technology cannot pick up. We reviewed that her family fits the criteria for Familial Pulmonary Fibrosis and reviewed recommendations for this. It will be important for her to keep in contact with genetics to learn if additional testing may be needed in the future.

## 2019-07-05 NOTE — Telephone Encounter (Signed)
Left message to relay date/time of covid test.  07/07/2019 [facility] 11[facility]a [facility]

## 2019-07-06 ENCOUNTER — Ambulatory Visit: Payer: Self-pay | Admitting: Licensed Clinical Social Worker

## 2019-07-06 ENCOUNTER — Encounter: Payer: Self-pay | Admitting: Licensed Clinical Social Worker

## 2019-07-06 DIAGNOSIS — J84112 Idiopathic pulmonary fibrosis: Secondary | ICD-10-CM

## 2019-07-06 DIAGNOSIS — Z1379 Encounter for other screening for genetic and chromosomal anomalies: Secondary | ICD-10-CM | POA: Insufficient documentation

## 2019-07-06 DIAGNOSIS — Z836 Family history of other diseases of the respiratory system: Secondary | ICD-10-CM

## 2019-07-06 NOTE — Progress Notes (Signed)
GENETIC TEST RESULTS   Patient Name: Tina Calderon Patient Age: 79 y.o. Encounter Date: 07/06/2019  Ms. Betzler was seen in the [facility] clinic due to a personal and family history of pulmonary fibrosis and concern regarding a hereditary predisposition to pulmonary fibrosis in the family. Please refer to the prior Genetics clinic note for more information regarding Ms. Payer's medical and family histories and our assessment at the time.   FAMILY HISTORY:  We obtained a detailed, 4-generation family history.  Significant diagnoses are listed below: Family History  Problem Relation Age of Onset  . Thyroid disease Mother   . Hypertension Mother   . Coronary artery disease Mother   . Dementia Mother   . Stroke Mother   . Aneurysm Father   . Pulmonary fibrosis Sister   . Stroke Other   . Sleep apnea Other   . Liver disease Brother   . Pulmonary fibrosis Brother   . Stroke Maternal Grandmother   . Dementia Maternal Grandmother   . Heart attack Maternal Grandfather 67  . COPD Son    Ms. Lahmann has 2 children: Johnny, 13 and Santiago Glad, 29. Both have had history of smoking but have quit. Charlotte Crumb was recently diagnosed with COPD. Ms. Lamere had 2 brothers and 2 sisters. One of her brothers passed from pulmonary fibrosis at 47 and a sister passed from pulmonary fibrosis at 65. Another brother passed due to liver failure. She has one sister, Stanton Kidney, who is living and healthy. No nieces or nephews with cancer or pulmonary fibrosis.  Ms. Branscom mother died at 59. Patient had 4 maternal aunts, no cancers and no pulmonary fibrosis. No known cancers or pulmonary fibrosis in cousins. Maternal grandmother had a stroke and dementia and passed at 37. Maternal grandfather had a heart attack and passed at 65.   Ms. Bortle father died at 1 due to an aneurysm. Patient had 12 paternal aunts and uncles but has limited information about them. No known cancers or pulmonary fibrosis that she is  aware of for these aunts/uncles/cousins. Paternal grandmother died young in a horse accident, and grandfather died at 50 of old age.   Ms. Clinkscale is unaware of previous family history of genetic testing. Patient's maternal ancestors are of [place] descent, and paternal ancestors are of [place] descent. There is no reported Ashkenazi Jewish ancestry. There is no known consanguinity.  GENETIC TESTING:  At the time of Ms. Gerry's visit, we recommended she pursue genetic testing of the GeneDx XomeDx Slice  test. The genetic testing reported on 07/02/2019 and was negative.  No pathogenic, likely pathogenic, or other sequence changes of possible clinical relevance were identified in the coding regions of the genes evaluated by XomeDxSlice.  The following genes were specifically reviewed with the percentage of the coding region covered at >10X by exome sequencing indicated in parentheses: ABCA3 (100%), DKC1 (100%), NAF1 (100%), PARN (99.8%), RTEL1 (100%), SFTPA2 (100%), SFTPC (100%), TERC (100%), TERT (100%), TINF2 (100%).       DISCUSSION Pulmonary fibrosis is a group of lung diseases characterized by development of scarring in the lungs.  There are several different subtypes of pulmonary fibrosis, including idiopathic pulmonary fibrosis (IPF), with different causes that are defined based on their appearance on imaging tests like a CT scan or how lung tissue looks under the microscope after a lung biopsy.  The incidence of pulmonary fibrosis in the general population is approximately 6-16/100,000 people in the [place].     Familial Pulmonary Fibrosis (FPF) is  defined by having two or more first degree relatives (parent, sibling or child) in a family with a diagnosis of pulmonary fibrosis.  In FPF, there is evidence that an underlying inherited genetic component may contribute to the development of pulmonary fibrosis, but environmental factors may also contribute. At present, several genes are known to be  associated with FPF and account for approximately 20-25% of FPF cases.  Thus, additional genes remain to be discovered.    Approximately 20% of patients with FPF have a change (or mutation) in one of their telomerase genes, including TERT, TERC, RTEL1, and PARN.  Individuals with mutations in these genes may also be at risk for other health conditions such as low blood counts, early gray hair, or liver disease.  Rarely, FPF is due to mutations in other genes such as SFTPC or SFTPA2.   The inheritance pattern in FPF is unclear and may vary from family to family, but autosomal dominance with reduced penetrance appears most likely.  This means that it only takes one copy of the genetic factor inherited from one parent to have risk for the condition and there is a 50/50 chance of inheriting that genetic factor.  However, reduced penetrance means that not all individuals who inherit the genetic factor will develop symptoms or the disease.     FAMILIAL PULMONARY FIBROSIS MANAGEMENT RECOMMENDATIONS: At present, there are no evidence-based recommendations for at risk individuals in FPF families. Thus, the following recommendations are based on expert opinion and should be modified for each individual by his/her personal physician.     Pulmonary screening: -Asymptomatic at risk individuals in FPF families can consider baseline pulmonary function tests (breathing tests) as well as a visit to a pulmonologist beginning around age 24 -Individuals who develop breathing difficulty symptoms such as shortness of breath or persistent cough at any age should seek evaluation to determine the cause -The use of chest xray or CT scan screening should be individualized -Intervals for screening can be determined based on findings on baseline evaluation and/or symptoms   Pulmonary disease prevention: -Avoid smoking and all smoke exposure -Avoid exposure to other known lung toxins such as asbestos and metal dusts  SUPPORT AND  RESOURCES: If Ms. Witkop  is interested in IPF-specific information and support, there is a group called Pulmonary Fibrosis Foundation (http://www.pulmonaryfibrosis.org). To locate genetic counselors in other cities, visit the website of the [employer] (ArtistMovie.se) and Secretary/administrator for a Social worker by zip code.  We encouraged Ms. Boesen to remain in contact with [place] on an annual basis so we can update her personal and family histories, and let her know of advances in pulmonary fibrosis genetics that may benefit the family. Our contact number was provided. Ms. Sugarman questions were answered to her satisfaction today, and she knows she is welcome to call anytime with additional questions.   Faith Rogue, MS, [facility] Genetic Counselor [email or web address] Phone: [phone]

## 2019-07-07 ENCOUNTER — Other Ambulatory Visit
Admission: RE | Admit: 2019-07-07 | Discharge: 2019-07-07 | Disposition: A | Discharge status: Home / Self Care | Payer: Medicare PPO | Source: Ambulatory Visit | Attending: Internal Medicine | Admitting: Internal Medicine

## 2019-07-07 ENCOUNTER — Telehealth: Payer: Self-pay | Admitting: Internal Medicine

## 2019-07-07 ENCOUNTER — Other Ambulatory Visit: Payer: Self-pay

## 2019-07-07 DIAGNOSIS — Z20822 Contact with and (suspected) exposure to covid-19: Secondary | ICD-10-CM | POA: Insufficient documentation

## 2019-07-07 DIAGNOSIS — Z01812 Encounter for preprocedural laboratory examination: Secondary | ICD-10-CM | POA: Insufficient documentation

## 2019-07-07 LAB — SARS CORONAVIRUS 2 (TAT 6-24 HRS): SARS Coronavirus 2: NEGATIVE

## 2019-07-07 NOTE — Telephone Encounter (Signed)
Received notification from [facility] regarding Appeal for [place]. Authorization has been APPROVED from 06/11/19 to 06/09/20.   Will send document to scan center.  Authorization # [id number] Phone # [phone]  11:35 AM Beatriz Chancellor, CPhT

## 2019-07-07 NOTE — Telephone Encounter (Signed)
Spoke to pt, who stated that she received a call from our office. Pt stated that a voicemail was not left.  I have made pt aware that we do not have record of anyone attempting to contact her today.  Pt voiced her understanding.  Nothing further is needed.

## 2019-07-08 ENCOUNTER — Ambulatory Visit: Payer: Medicare PPO | Attending: Internal Medicine

## 2019-07-08 DIAGNOSIS — J84112 Idiopathic pulmonary fibrosis: Secondary | ICD-10-CM | POA: Insufficient documentation

## 2019-07-08 MED ORDER — ALBUTEROL SULFATE (2.5 MG/3ML) 0.083% IN NEBU
2.5000 mg | INHALATION_SOLUTION | Freq: Once | RESPIRATORY_TRACT | Status: AC
  Administered 2019-07-08: 2.5 mg via RESPIRATORY_TRACT
  Filled 2019-07-08: qty 3

## 2019-07-10 ENCOUNTER — Other Ambulatory Visit: Payer: Self-pay | Admitting: Emergency Medicine

## 2019-07-16 ENCOUNTER — Ambulatory Visit: Payer: Medicare Other | Admitting: Internal Medicine

## 2019-07-16 ENCOUNTER — Encounter: Payer: Self-pay | Admitting: Internal Medicine

## 2019-07-16 ENCOUNTER — Other Ambulatory Visit: Payer: Self-pay

## 2019-07-16 ENCOUNTER — Other Ambulatory Visit
Admission: RE | Admit: 2019-07-16 | Discharge: 2019-07-16 | Disposition: A | Discharge status: Home / Self Care | Payer: Medicare PPO | Source: Ambulatory Visit | Attending: Internal Medicine | Admitting: Internal Medicine

## 2019-07-16 VITALS — BP 124/72 | HR 102 | Temp 98.1°F | Ht 62.5 in | Wt 123.2 lb

## 2019-07-16 DIAGNOSIS — R634 Abnormal weight loss: Secondary | ICD-10-CM | POA: Diagnosis not present

## 2019-07-16 DIAGNOSIS — J9611 Chronic respiratory failure with hypoxia: Secondary | ICD-10-CM

## 2019-07-16 DIAGNOSIS — J84112 Idiopathic pulmonary fibrosis: Secondary | ICD-10-CM

## 2019-07-16 DIAGNOSIS — Z5181 Encounter for therapeutic drug level monitoring: Secondary | ICD-10-CM

## 2019-07-16 LAB — HEPATIC FUNCTION PANEL
ALT: 20 U/L (ref 0–44)
AST: 27 U/L (ref 15–41)
Albumin: 3.8 g/dL (ref 3.5–5.0)
Alkaline Phosphatase: 47 U/L (ref 38–126)
Bilirubin, Direct: 0.1 mg/dL (ref 0.0–0.2)
Indirect Bilirubin: 0.7 mg/dL (ref 0.3–0.9)
Total Bilirubin: 0.8 mg/dL (ref 0.3–1.2)
Total Protein: 6.8 g/dL (ref 6.5–8.1)

## 2019-07-16 NOTE — Progress Notes (Signed)
LFT normal

## 2019-07-16 NOTE — Patient Instructions (Addendum)
ICD-10-CM   1. 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 Chronic respiratory failure with hypoxia (H[facility])  J96.11   4. Therapeutic drug monitoring  Z51.81   5. Weight loss  R63.4    Idiopathic pulmonary fibrosis appears stable compared to last visit in October 2020 but I do agree that over time symptoms have gotten worse.  And IPF has gotten worse.  There is some diarrhea that is mild with nintedanib and I am glad able to control that with Imodium  Your weight is stable at 123 pounds which is 3 pounds lighter than October 2020 but stable since November 2020  Glad he saw the geneticist Roma Kayser  Plan -Continue nintedanib 150 mg twice daily -Continue oxygen 3 L with exertion and 2-3 L at rest and at night  -Take a printout of website for [employer] portable system and see if you need to get oxygen out-of-pocket  -CMA will try another homecare company to see if they can give you portable oxygen  -Check liver function test today  -Do repeat spirometry and DLCO in 3 months -Repeat liver function test in 3 months   Follow-up -Dr. [name] in [place] clinic in 3 months but after spirometry and DLCO     - of this per the note.If unable to do spirometry and DLCO because of the COVID-19 pandemic we will just do simple walk test and symptom score

## 2019-07-16 NOTE — Progress Notes (Signed)
79 yo female never smoker seen for initial pulmonary consult during hospital for acute respiratory failure   [facility] follow up 01/30/12 -  Pt was admitted 2 weeks ago for chest pain and dyspnea.  Found to have Acute respiratory failure with hypoxia--  Tx for possible diastolic CHF with aggressive diuresis Echo showed EF 60% , no mention of diastolic dysfxn.  HRCT consistent with ILD-  CCM consult with autoimmune workup that was unrevealing with neg ANA, ANCA, RA factor , nml ESR , CRP    Does light housework but not able walk long distances due to dyspnea  FH of pulmonary fibrosis -sibling -they were smokers. NO significant desats with walking in cardiology yesterday, desated to 93% on room air.  No cough or wheezing  No edema.   Since discharge she is feeling better with less DOE. But does get winded with walking long distances.   ROV 02/21/12 -- follows up for ILD noted during hospitalization for resp failure. Treated for possible diastolic CHF. CT scan shows some mild basilar fibrotic change. PFT's 9/3 >> probable mixed disease, no BD response, restricted volumes, decrease DLCO that corrects for Va.  She had family hx of ILD.  She had walking oximetry that she says was reassuring. She feels no better today than at hospital discharge.   ROV 03/24/12 -- ILD without clear etiology, mixed dz on PFT 02/11/12. Initiated a trial prednisone last month >> She doesn't believe that her dyspnea has changed.  Last time she did not desat.   ROV 06/23/12 -- ILD without clear etiology, mixed dz on PFT 02/11/12. Trial of pred >> weaned to off. Sent her to pulm rehab at [facility], feels that it has benefited her. She isn't as SOB as before. CXR today >> stable bibasilar ILD, hiatal hernia.   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 Also possible component diastolic dysfxn. She has a family hx of ILD. She  tells me that her dyspnea is worse compared with last year. She is having some spells of near syncope on exertion. Walked today to exhaustion without desaturation.   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 She has been part of the pirfenidone trial and has been on the medication. She will be transitioning to the commercial drug. Denies any side effects from the medication. I completed financial assistance forms on 04/12/13.  Due for CXR today. Not really doing exercise - she did do pulmonary rehab at [facility].    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 Last CXR 05/19/13, last CT scan 01/24/12. She is still on the pirfenidone trial, receiving study drug. She is fairly stable. Will be converted to the commercial med soon. Tolerates soon.   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 Returns for f/u. She is now on pirfenidone 3 pills tid and is tolerating. She is having more problems with fatigue and dyspnea with exertion - has lay down after eating dinner, with chores. Her synthroid hasn't been adjusted in a long time.  Her last labs were March 3, 15.   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 She is on pirfenidone 3 pills tid. She has been tolerating well from GI standpoint. TSH normal. She has some fatigue, otherwise doing well.  She needs LFT done in 3 months and after that we can go to every 6 months,.   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 On pirfenidone 3 pills tid since August 2014. Last CT scan 5/'15 was stable, described as an NSIP pattern.  LFT were done today, not yet available. She has noticed some slightly raised pale pink macules, not pruritic, since last month. She has  been careful to wear sunscreen.    ROV 12/01/17 --this follow-up visit for patient with a history of idiopathic interstitial lung disease, some coexisting obstruction based on her spirometry.  She also has a hiatal hernia with GERD and chronic cough.  We have managed her on pirfenidone.  At our last visit she noted that she is had some decreased functional capacity, more dyspnea with exertion. She is still having decreased energy, more fatigue with activity. We checked CBC and TSH, both reassuring. She keeps a slight cough, added chlorpheniramine to loratadine. Remains on omeprazole.    She underwent a 6-minute walk test today and was able to walk 462 m without desaturation.  Her last in 04/2016 was 42mRepeat CT chest from 11/28/2017 reviewed and shows stable interstitial changes compared with her most recent from 2017.  There is patchy groundglass attenuation with associated septal thickening and peripheral bronchial ectasis without overt honeycomb change.  No effusion, no suspicious nodules.   ROV 06/18/18 --Tina Calderon is 89 followed for history of idiopathic interstitial lung disease and some coexisting obstruction noted on spirometry.  She also has a hiatal hernia with GERD and some associated chronic cough.  We have been managing her on pirfenidone - no side effects.  She is on omeprazole, loratadine, chlorpheniramine.  Since our last visit she was hospitalized for a stroke, has some residual word-finding trouble. She is scheduled for nerve conduction studies after some falls and leg weakness. She is also being evaluated for possible thyroid surgery with Dr. GHarlow Asa She had a CT chest done 01/12/18 that I reviewed, shows no real change in NSIP pattern, large HH, no new infiltrates. Overall reports that breathing is stable, not limiting.   ROV 12/03/2018 --pleasant 79year old woman with mixed lung disease.  She has mild obstruction and restriction in the setting of a hiatal hernia and idiopathic  interstitial lung disease that would characterizes IPF.  We have been managing her on pirfenidone.  Also with a history of GERD on Prilosec, allergic rhinitis on loratadine. Last LFT in 05/2018 were normal. Last Ct chest was 01/2018. She is having progressive exertional SOB compared with last time. She has to rest doing housework, taking out the trash. She has to stop to rest. Not on BD's, didn't benefit. She had thyroid sgy in 07/2018, just had her synthroid increased in 07/2018. TSH was 3.05 5/26.     OV 02/25/2019  Subjective:  Patient ID: NRutherford Calderon female , DOB: [id number], age 79y.o. , MRN: [id number], [id number] [id number]  02/25/2019 -   Chief Complaint  Patient presents with  . IPF (idiopathic pulmonary fibrosis)    Had 6 minute walk yesterday.     Clinical IPF patient  - On Esbreit since aug 2014 initially via  expanded access program   - Autoimmune panel 2013 and 2014 is negative and normal   - Pulmonary function test 02/15/2014 postbronchodilator FVC 1.55 L/  59%, total lung capacity 3.05 L/64%, DLCO 14.3/66%  - CT chest  May 2015 - NSIP per radioolgy  - Trial participagnt early August 01, 2014 for 6 months IPF - Phase 4 study - got esbriet and 6 months of add-on ofev   HPI Tina Calderon 79 y.o. -has now been referred to the ILD clinic.  This is because she has had progression in her symptoms.  She says for the last few months she has had progressive shortness of breath but no worsening cough or pedal edema or hemoptysis or proximal nocturnal dyspnea.  She is tolerating the Esbriet well since August 2014 when she was in the expanded access program.  Overall she is stable and she is active in the pulmonary fibrosis foundation support group but in the last few months is progressive dyspnea.  Nurse practitioner did a high-resolution CT scan of the chest and it shows progression although the pattern is still indeterminate for UIP.  In addition  6-minute walk test last week she had new onset desaturation and has been started on portable oxygen which is helping.  However she does not have nighttime oxygen.  There is a spirometry that is upcoming.  Review of the spirometry is between 01-Aug-2013 and Aug 02, 2015 show clear progression in both FVC and DLCO.  Previously serology was negative in 20 13/2014 but she says she might have Raynaud Echocardiogram November 2019 with normal ejection fraction and grade 1 diastolic dysfunction  [facility] Comprehensive ILD Questionnaire  Symptoms: -ILD diagnosed in 20 14/2015.  Which is gradually worse in the last several months.  She is having level for shortness of breath the household work shopping and walking on level ground at her own pace.  She does not think any other condition is limiting her.  No cough.  No wheezing   Past Medical History : Negative for collagen vascular disease acid reflux but positive for previous stroke in November 2019 and thyroid disease.  Detailed past medical history is negative otherwise   ROS: Positive for fatigue   FAMILY HISTORY of LUNG DISEASE: Her sister died in 74 from pulmonary fibrosis and a brother died in August 01, 2004 from pulmonary fibrosis   EXPOSURE HISTORY: No smoking no cigars.  No vaping no marijuana.  No cocaine.  No intravenous drug use.   HOME and HOBBY DETAILS : Lives in an apartment.  No mold exposure no mildew exposure.  Extensive home exposure history is negative.   OCCUPATIONAL HISTORY (122 questions) : Positive for gardening but otherwise negative.   PULMONARY TOXICITY HISTORY (27 items): Intermittent prednisone burst otherwise negative.     Results for SHAVON, ASHMORE (MRN [id number]) as of 02/25/2019 11:43  Ref. Range 02/15/2014 11:51 07/15/2014 11:47 01/03/2015 09:40 03/17/2015 13:03 10/23/2015 08:45  FVC-Pre Latest Units: L 1.58 1.59 1.52 1.47 1.44   Results for Tina, Calderon (MRN [id number]) as of 02/25/2019 11:43  Ref. Range 02/15/2014  11:51 07/15/2014 11:47 01/03/2015 09:40 03/17/2015 13:03 10/23/2015 08:45  DLCO unc Latest Units: ml/min/mmHg 14.30 13.65 13.66 12.11 12.65  DLCO unc % pred Latest Units: % 66 63 63 56 58    CT chest high resolution Aug 2020 -personally visualized  standard protocol without intravenous contrast. High resolution imaging of the lungs, as well as inspiratory and expiratory imaging, was performed.  COMPARISON:  11/28/2017, 10/09/2015, 01/24/2012  FINDINGS: Cardiovascular: Aortic atherosclerosis. Cardiomegaly. Left coronary artery calcifications. No pericardial effusion.  Mediastinum/Nodes: No enlarged mediastinal, hilar, or axillary lymph nodes. Large hiatal hernia with intrathoracic position of the gastric body and fundus. Thyroid gland, trachea, and esophagus demonstrate no significant  findings.  Lungs/Pleura: Redemonstrated moderate pulmonary fibrosis in a pattern with an apical to basal gradient, featuring peripheral interstitial opacity and some suggestion of nodularity, mild traction bronchiectasis, and occasional areas of subpleural bronchiolectasis in the bases. There is no evidence of honeycombing. Moderate lobular air trapping on expiratory phase imaging. Findings are not significantly changed when compared to immediate prior examination dated 11/28/2017, however are slightly worsened over time on comparison to multiple prior examinations dating back to at least 01/24/2012. No pleural effusion or pneumothorax.  Upper Abdomen: No acute abnormality.  Musculoskeletal: No chest wall mass or suspicious bone lesions identified.  IMPRESSION: 1. Redemonstrated moderate pulmonary fibrosis in a pattern with an apical to basal gradient, featuring peripheral interstitial opacity and some suggestion of nodularity, mild traction bronchiectasis, and occasional areas of subpleural bronchiolectasis in the bases. There is no evidence of honeycombing. Moderate lobular air trapping  on expiratory phase imaging. Findings are not significantly changed when compared to immediate prior examination dated 11/28/2017, however are slightly worsened over time on comparison to multiple prior examinations dating back to at least 01/24/2012. Findings remain indeterminate for UIP and differential considerations include both UIP and chronic fibrosing NSIP.  2.  Coronary artery disease and aortic atherosclerosis.  3.  Hiatal hernia.   Electronically Signed   By: Eddie Candle M.D.   On: 01/15/2019 12:09  ROS - per HPI Recent Labs  Lab 02/23/19 1153  AST 20  ALT 13  ALKPHOS 47  BILITOT 0.3  PROT 6.8  ALBUMIN 4.1     OV 04/08/2019  Subjective:  Patient ID: Tina Calderon, female , DOB: 1941-05-13 , age 25 y.o. , MRN: [id number] , ADDRESS: [id number] [place] [address]   04/08/2019 -   Chief Complaint  Patient presents with  . Follow-up    Patient reports sob with exertion and a mild cough.     Clinical IPF patient    - strong family hx of ILD (brother died age 71, sister died in her 84s)  - Autoimmune panel 2013 and 2014 is negative and normal   - Pulmonary function test 02/15/2014 postbronchodilator FVC 1.55 L/  59%, total lung capacity 3.05 L/64%, DLCO 14.3/66%  - CT chest  May 2015 - NSIP per radioolgy  - Rx  - - On Esbreit since aug 2014 initially via  expanded access program (Trial participagnt early 2016 for 6 months IPF - Phase 4 study - got esbriet and 6 months of add-on ofev)  - Switched to ofev due to progessive disease mid-oct 2020 (03/24/2019) - started on portable o2 sept 2020   HPI Tina Calderon 79 y.o. - returns for followup following starting ofev 2 weeks ago. Therapy was changed due to progressive disease. She is tolerating nintedanib fine.  The couple of episodes of mild diarrhea that she had.  One time she had vomiting.  This is following a a family gathering.  Other than that she is tolerating nintedanib just  fine.  In terms of her ILD even though it is progressed compared to last visit in September she feels she is stable.  She is having significant cough.  She says vitamin C helps this.  She is using portable oxygen as needed.  She wants a lighter tank.  She has had overnight oxygen study with the results are still pending this was done a few days ago.  She reminded me about the strong family history of ILD that she has.  Her brother died  at age 35 7 years ago her sister died in her 70s also from ILD.  She is interested in seeing the Retail buyer.  I have sent email to genetics counselor Roma Kayser within our health system.    ROS   OV 04/29/2019  Subjective:  Patient ID: Tina Calderon, female , DOB: August 03, 1940 , age 38 y.o. , MRN: [id number] , ADDRESS: [id number] [place] [address]   04/29/2019 -   Chief Complaint  Patient presents with  . Follow-up    Pt states she has been doing well since last visit and denies any complaints.    Follow-up idiopathic pulmonary fibrosis progressive now on nintedanib since mid October 2020. HPI Tina Calderon 79 y.o. -returns for follow-up.  She is returning sooner than anticipated.  I saw her 3 weeks ago.  I wanted her back in 4-6 weeks to see how the nintedanib is going.  Due to scheduling issues she comes back in 3 weeks.  She tells me there are no new issues.  She is losing weight.  She did have interim colonoscopy which she says was normal.  She is unsure why she is losing weight.  I explained to her that this could be because of IPF or could be due to previous pirfenidone or current nintedanib.  At this point in time she wants to take the drugs and continue to monitor his situation.  Last liver function test 3 weeks ago was normal.  She is now on 2 L nasal cannula at night since end of October 2020.  She is also using oxygen with exertion.  She wants a light portable system.  She has called [facility].  She wants our  support in getting a lightweight portable oxygen system.     OV 05/28/2019  Subjective:  Patient ID: Tina Calderon, female , DOB: [birth year] , age 52 y.o. , MRN: [id number] , ADDRESS: [id number] [place] [address]   05/28/2019 -   Chief Complaint  Patient presents with  . Follow-up    pt reports of sob with exertion. no new concerns or sx.  on 3L at night and 2L during the day PRN.      Follow-up idiopathic pulmonary fibrosis progressive now on nintedanib since mid October 2020.   HPI Tina Calderon 79 y.o. - 3L night and 2L night prn in day. Normal plse ox Room air at rest. Tolerting ofev well. No diarrhea. No issue swith appetite. No Nausea, vomitting. Lost some weight 122.4# . She lives < 5 miles Dyspnea is stable. She wants portable o2 - but she says [place] at [facility] office has not called her back. [facility] was supposed to be the provider Though on subjective score below she is worse but she tells me she  Is stable . LFT is normal \   OV 07/16/2019  Subjective:  Patient ID: Tina Calderon, female , DOB: July 12, 1940 , age 16 y.o. , MRN: [id number] , ADDRESS: [id number] [place] [address]   07/16/2019 -   Chief Complaint  Patient presents with  . Follow-up    Pt states she has been feeling good since last OV. SOB is getting worse. Occ. non productive cough. Pt denies any wheezing, fever, or chills   Follow-up idiopathic pulmonary fibrosis progressive now on nintedanib since mid October 2020. Chronic hypoxemic respiratory failure due to the above  HPI Tina Calderon 79 y.o. -presents for follow-up.  She feels that since her last visit her dyspnea slightly worse.  However on pulmonary function test FVC is stable and DLCO is only a little bit worse.  Her walking desaturation test shows continued stability.  She is pleased about it.  However her symptom score is significantly worse.  Because of the isolation she is not exercising.   There is no chest pain no edema.  Her weight itself is stable.  She was initially losing weight and nintedanib but currently it is stable and is documented below.  She is frustrated by the lack of portable oxygen.  She is saying she is using a heavy tank.  Apparently our office called her home care agency called [facility] but she never heard back.  The medical assistant inquired today and it is likely because of the COVID-19 pandemic portable oxygen systems are not presently available    SYMPTOM SCALE - ILD 04/08/2019 126# 04/29/2019 123# 05/28/2019 122# 07/16/2019 123#  O2 use Portable o2 as needed     Shortness of Breath 0 -> 5 scale with 5 being worst (score 6 If unable to do)    uses 3 L with exertion and 2-1/2 L at night  At rest _0 Simple tasks - showers, clothes change, eating, shaving _1 Household (dishes, doing bed, laundry) _2 3.5  Shopping _3 Walking level at own pace _4 Walking up Stairs _5 Total (40 - 48) Dyspnea Score 19   24.5  How bad is your cough? _6 How bad is your fatigue _7 Simple office walk 185 feet x  3 laps goal with forehead probe 04/08/2019  04/29/2019  07/16/2019   O2 used RA RA RA [facility] office  Number laps completed _8 Comments about pace - stopped at 2 Sopped at end of 2nd lap   Resting Pulse Ox/HR 96% and 104/min 95% and HR 80.min 94% and HR 107/min  Final Pulse Ox/HR 82% and 120/min 85% and 108/min  87% and HR 132.min  Desaturated </= 88% yes yes yes  Desaturated <= 3% points yes yes yes  Got Tachycardic >/= 90/min yes yes yes  Symptoms at end of test Very dyspneice Moderate dyspnea Moderate dyspnea  Miscellaneous comments Started ofev mid oct 2020  Recovered quickly       Results for Tina, Calderon (MRN [id number]) as of 04/08/2019 11:19  Ref. Range 07/15/2014 11:47 01/03/2015 09:40 03/17/2015 13:03 10/23/2015 08:45 03/12/2019 16:47 - startr ofev due to progerssiv diseae 07/16/2019   FVC-Pre  Latest Units: L 1.59 1.52 1.47 1.44 1.08 1.08  FVC-%Pred-Pre Latest Units: % 61 59 57 56 44   Results for Tina, Calderon (MRN [id number]) as of 04/08/2019 11:19  Ref. Range 02/15/2014 11:51 07/15/2014 11:47 01/03/2015 09:40 03/17/2015 13:03 10/23/2015 08:45 03/12/2019 16:47 - start ofev 07/16/2019   DLCO unc Latest Units: ml/min/mmHg 14.30 13.65 13.66 12.11 12.65 8.71 8.57  DLCO unc % pred Latest Units: % 66 63 63 56 58 49 47%    Last liver function test December 2020 and normal  Last echocardiogram November 2019 and normal ROS - per HPI     has a past medical history of Anginal pain ([place]), Cancer ([place]), CHF (congestive heart failure) ([place]) (01/22/2012), Chronic fatigue (07/15/2014), CVA (cerebral vascular accident) ([place]) (04/18/2018), Family history  of pulmonary fibrosis, GERD (gastroesophageal reflux disease) (05/12/2017), Hypertension, Hypothyroidism, ILD (interstitial lung disease) ([place]) (02/04/2012), IPF (idiopathic pulmonary fibrosis) ([place]) (2013), Mild intermittent asthma (01/29/2016), Pelvic relaxation, Prolapse of bladder, Shortness of breath, and Thyroid disease.   reports that she has never smoked. She has never used smokeless tobacco.  Past Surgical History:  Procedure Laterality Date  . ANTERIOR AND POSTERIOR REPAIR  12/05/2011   Procedure: ANTERIOR (CYSTOCELE) AND POSTERIOR REPAIR (RECTOCELE);  Surgeon: [name], MD;  Location: [facility];  Service: Gynecology;  Laterality: N/A;  with cysto  . Bladder tact  15 years ago  . COLONOSCOPY WITH PROPOFOL N/A 04/20/2019   Procedure: COLONOSCOPY WITH PROPOFOL;  Surgeon: [name], MD;  Location: [facility] ENDOSCOPY;  Service: Gastroenterology;  Laterality: N/A;  . LEFT HEART CATHETERIZATION WITH CORONARY ANGIOGRAM N/A 01/22/2012   Procedure: LEFT HEART CATHETERIZATION WITH CORONARY ANGIOGRAM;  Surgeon: [name], MD;  Location: [facility] CATH LAB;  Service: Cardiovascular;  Laterality: N/A;  . NECK SURGERY  2010  . SHOULDER SURGERY    .  thyroid disease    . THYROID LOBECTOMY Left 07/17/2018   Procedure: LEFT THYROID LOBECTOMY;  Surgeon: [name], MD;  Location: [facility];  Service: General;  Laterality: Left;  . TONSILLECTOMY    . VAGINAL HYSTERECTOMY  15 years ago    Allergies  Allergen Reactions  . Dilaudid [Hydromorphone] Nausea And Vomiting  . Crestor [Rosuvastatin Calcium] Other (See Comments)    Myalgias, muscle weakness    Immunization History  Administered Date(s) Administered  . Influenza Split 02/22/2012, 03/01/2013, 02/08/2014, 01/27/2015  . Influenza Whole 04/11/2011, 02/10/2017  . Influenza, High Dose Seasonal PF 02/16/2017, 01/20/2019  . Influenza,inj,Quad PF,6+ Mos 03/11/2016  . Influenza-Unspecified 01/26/2018  . PFIZER SARS-COV-2 Vaccination 06/30/2019  . Pneumococcal Conjugate-13 03/29/2014  . Pneumococcal Polysaccharide-23 12/08/2017  . Pneumococcal-Unspecified 04/11/2011, 05/13/2016  . Td 08/30/2008  . Tdap 11/16/2017  . Zoster 08/07/2011  . Zoster Recombinat (Shingrix) 09/20/2016, 02/19/2017    Family History  Problem Relation Age of Onset  . Thyroid disease Mother   . Hypertension Mother   . Coronary artery disease Mother   . Dementia Mother   . Stroke Mother   . Aneurysm Father   . Pulmonary fibrosis Sister   . Stroke Other   . Sleep apnea Other   . Liver disease Brother   . Pulmonary fibrosis Brother   . Stroke Maternal Grandmother   . Dementia Maternal Grandmother   . Heart attack Maternal Grandfather 67  . COPD Son      Current Outpatient Medications:  .  acetaminophen (TYLENOL) 500 MG tablet, Take 1,000 mg by mouth every 6 (six) hours as needed for mild pain or headache. , Disp: , Rfl:  .  amLODipine (NORVASC) 5 MG tablet, TAKE 1 TABLET BY MOUTH EVERY DAY FOR BLOOD PRESSURE, Disp: 90 tablet, Rfl: 1 .  atorvastatin (LIPITOR) 40 MG tablet, TAKE 1 TABLET (40 MG TOTAL) BY MOUTH AT BEDTIME. FOR CHOLESTEROL., Disp: 90 tablet, Rfl: 1 .  CALCIUM CARBONATE-VITAMIN D PO, Take 1  tablet by mouth daily. , Disp: , Rfl:  .  clopidogrel (PLAVIX) 75 MG tablet, TAKE 1 TABLET (75 MG TOTAL) BY MOUTH DAILY. FOR STROKE PREVENTION., Disp: 90 tablet, Rfl: 1 .  CRANBERRY PO, Take 1 capsule by mouth every Monday, Wednesday, and Friday. , Disp: , Rfl:  .  ferrous sulfate 325 (65 FE) MG tablet, Take 325 mg by mouth daily., Disp: , Rfl:  .  levothyroxine (SYNTHROID) 100 MCG  tablet, TAKE 1 TABLET BY MOUTH EVERY MORNING ON AN EMPTY STOMACH. NO FOOD OR OTHER MEDICATIONS FOR 30 MIN, Disp: 90 tablet, Rfl: 1 .  loratadine (CLARITIN) 10 MG tablet, Take 10 mg by mouth daily. , Disp: , Rfl:  .  metoprolol succinate (TOPROL-XL) 25 MG 24 hr tablet, Take 1 tablet (25 mg total) by mouth daily., Disp: 30 tablet, Rfl: 11 .  Multiple Vitamins-Minerals (CENTRUM SILVER PO), Take 1 tablet by mouth daily., Disp: , Rfl:  .  OFEV 150 MG CAPS, 150 mg. As Directed, Disp: , Rfl:  .  omeprazole (PRILOSEC) 20 MG capsule, TAKE 1 CAPSULE BY MOUTH EVERY DAY, Disp: 90 capsule, Rfl: 1      Objective:   Vitals:   07/16/19 0957  BP: 124/72  Pulse: (!) 102  Temp: 98.1 F (36.7 C)  TempSrc: Temporal  SpO2: 98%  Weight: 123 lb 3.2 oz (55.9 kg)  Height: 5' 2.5" (1.588 m)    Estimated body mass index is 22.17 kg/m as calculated from the following:   Height as of this encounter: 5' 2.5" (1.588 m).   Weight as of this encounter: 123 lb 3.2 oz (55.9 kg).  _0 @  Filed Weights   07/16/19 0957  Weight: 123 lb 3.2 oz (55.9 kg)     Physical Exam   Thin female sitting on a wheelchair.  Basal crackles.  1+ edema which is baseline.  Abdomen soft normal heart sounds alert and oriented x3.    Assessment:       ICD-10-CM   1. 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 Chronic respiratory failure with hypoxia (H[facility])  J96.11   4. Therapeutic drug monitoring  Z51.81 Hepatic function panel  5. Weight loss  R63.4        Plan:       Patient Instructions       ICD-10-CM   1. IPF (idiopathic pulmonary fibrosis) ([place])  J84.112   2. Familial idiopathic pulmonary fibrosis ([place])  J84.112   3. Chronic respiratory failure with hypoxia (HCC)  J96.11   4. Therapeutic drug monitoring  Z51.81   5. Weight loss  R63.4    Idiopathic pulmonary fibrosis appears stable compared to last visit in October 2020 but I do agree that over time symptoms have gotten worse.  And IPF has gotten worse.  There is some diarrhea that is mild with nintedanib and I am glad able to control that with Imodium  Your weight is stable at 123 pounds which is 3 pounds lighter than October 2020 but stable since November 2020  Glad he saw the geneticist Roma Kayser  Plan -Continue nintedanib 150 mg twice daily -Continue oxygen 3 L with exertion and 2-3 L at rest and at night  -Take a printout of website for [employer] portable system and see if you need to get oxygen out-of-pocket  -CMA will try another homecare company to see if they can give you portable oxygen  -Check liver function test today  -Do repeat spirometry and DLCO in 3 months -Repeat liver function test in 3 months   Follow-up -Dr. [name] in [place] clinic in 3 months but after spirometry and DLCO     - of this per the note.If unable to do spirometry and DLCO because of the COVID-19 pandemic we will just do simple walk test and symptom score      SIGNATURE    Dr. Brand Males, M.D.,  F.C.C.P,  Pulmonary and Critical Care Medicine Staff Physician, [place] Director - Interstitial Lung Disease  Program  [facility] [place], [place], [address]  Pager: [phone], If no answer or between  15:00h - 7:00h: call [phone] Telephone: [phone]  10:54 AM 07/16/2019 `

## 2019-07-21 ENCOUNTER — Ambulatory Visit: Payer: Medicare PPO | Attending: Internal Medicine

## 2019-07-21 DIAGNOSIS — Z23 Encounter for immunization: Secondary | ICD-10-CM | POA: Insufficient documentation

## 2019-07-21 NOTE — Progress Notes (Signed)
Covid-19 Vaccination Clinic  Name:  Megumi Treaster    MRN: [id number] DOB: Oct 24, 1940  07/21/2019  Ms. Aydt was observed post Covid-19 immunization for 15 minutes without incidence. She was provided with Vaccine Information Sheet and instruction to access the V-Safe system.   Ms. Lyvers was instructed to call 911 with any severe reactions post vaccine: [name] Difficulty breathing  . Swelling of your face and throat  . A fast heartbeat  . A bad rash all over your body  . Dizziness and weakness    Immunizations Administered    Name Date Dose VIS Date Route   Pfizer COVID-19 Vaccine 07/21/2019  8:19 AM 0.3 mL 05/21/2019 Intramuscular   Manufacturer: [place]   Lot: [id number]   [place]: [id number]

## 2019-09-04 IMAGING — US US THYROID
1 series · 13 of 25 positions shown · non-contrast
Comparison: MR 01/31/2018

CLINICAL DATA: Hypothyroidism. Left nodule identified on cervical
MRI

EXAM:
THYROID ULTRASOUND
TECHNIQUE: Ultrasound examination of the thyroid gland and adjacent soft
tissues was performed.

[Series 1: us thyroid · 0.06mm/px · 56 acquisitions, 13 frames shown]
[im 1/56]
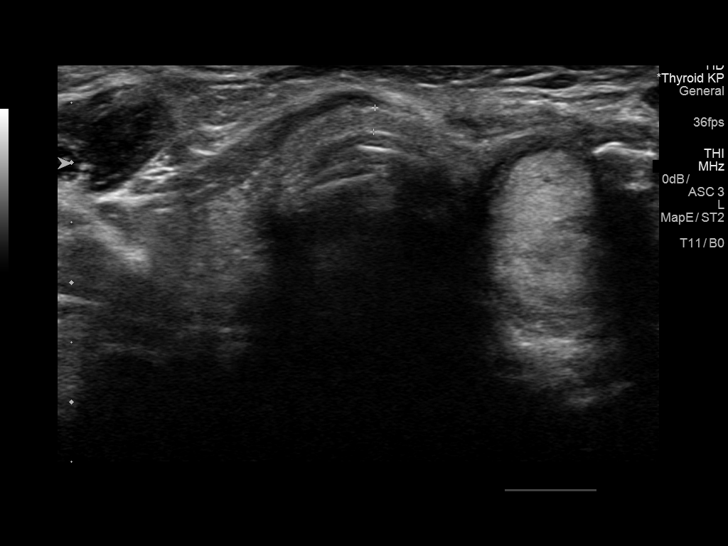
[im 5/56]
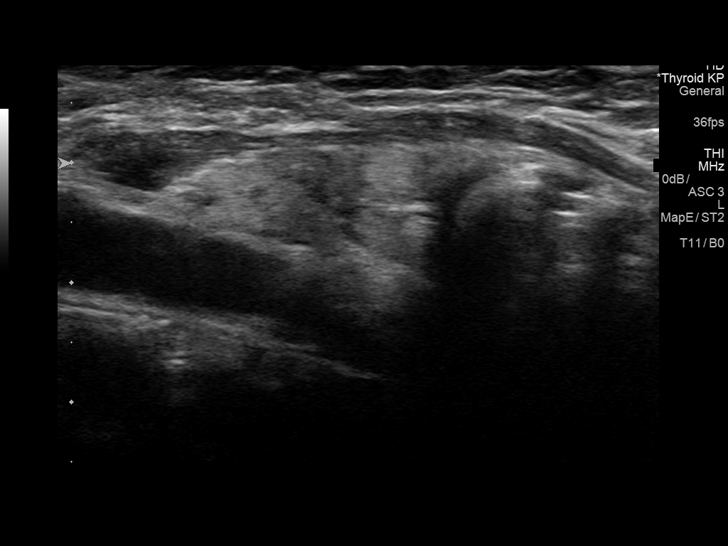
[im 10/56]
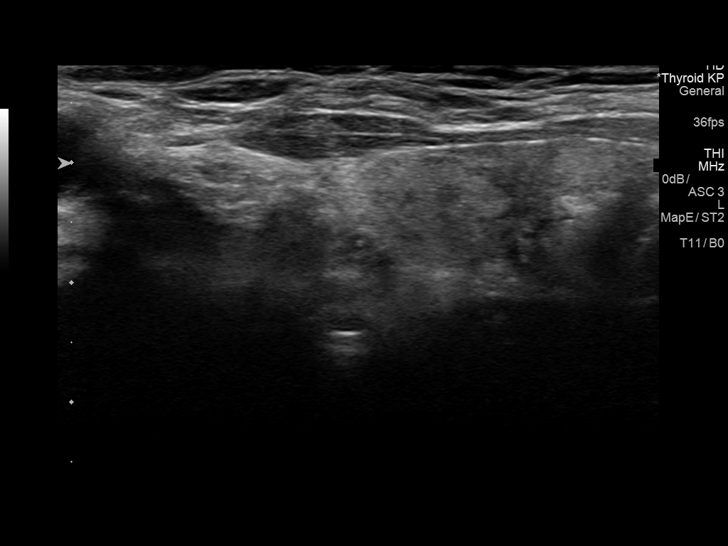
[im 14/56]
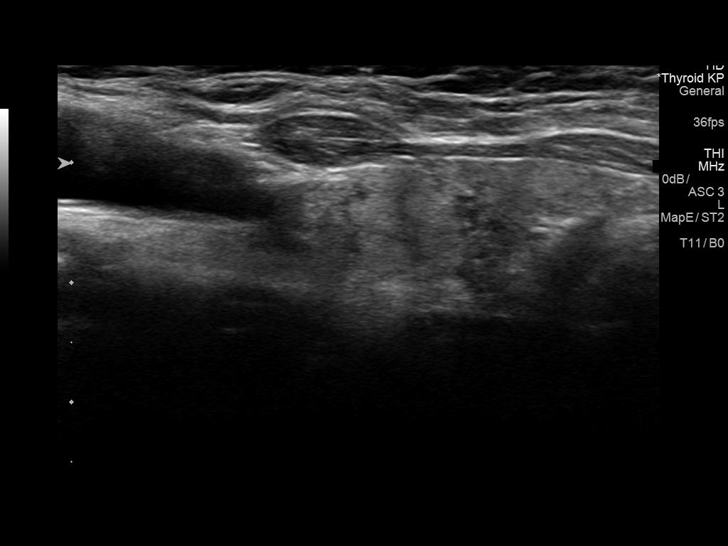
[im 19/56]
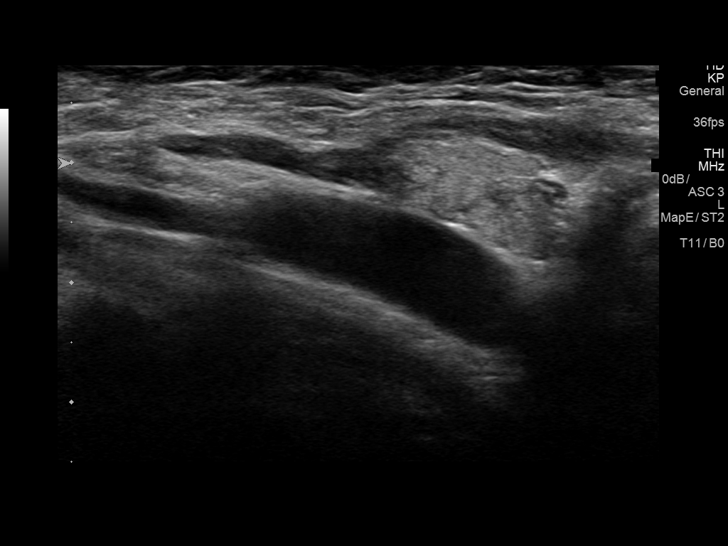
[im 23/56]
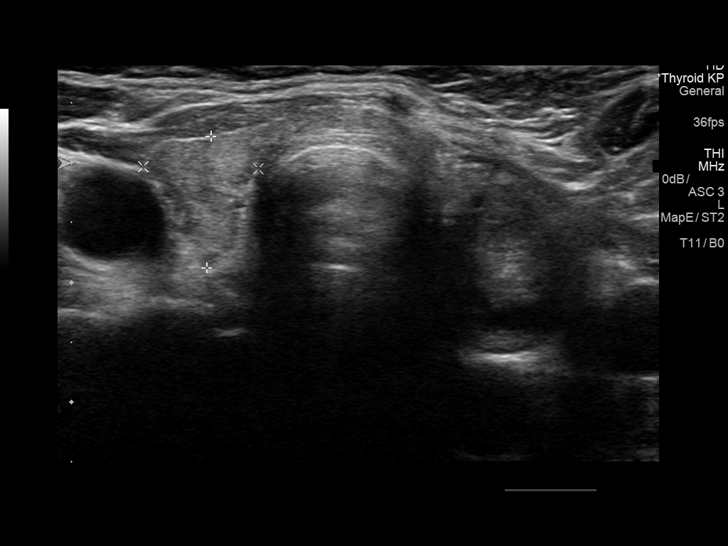
[im 28/56]
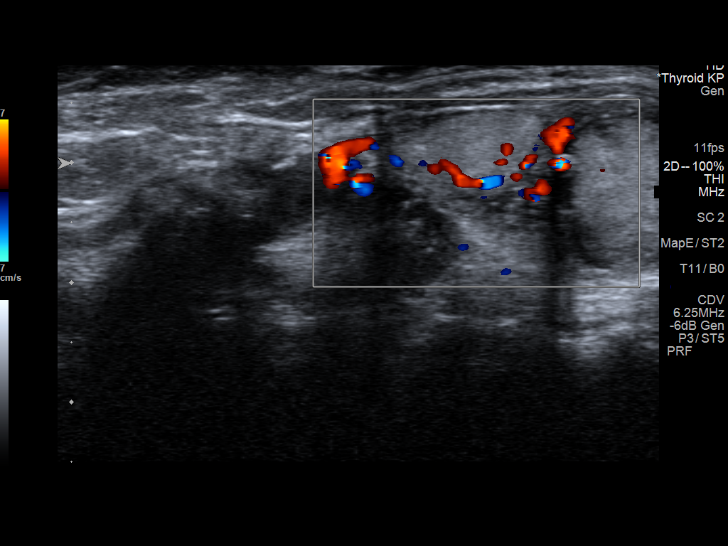
[im 33/56]
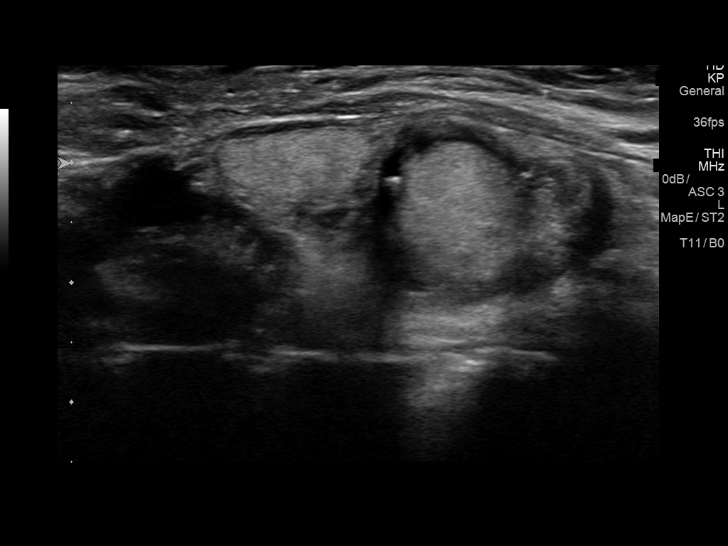
[im 37/56]
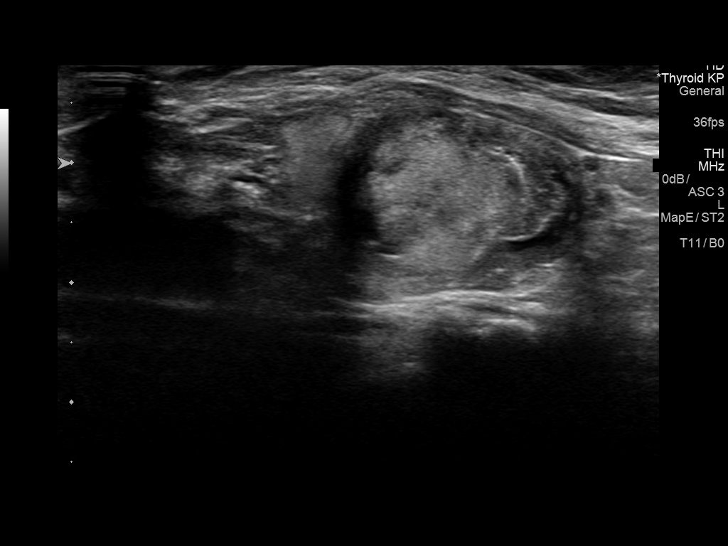
[im 42/56]
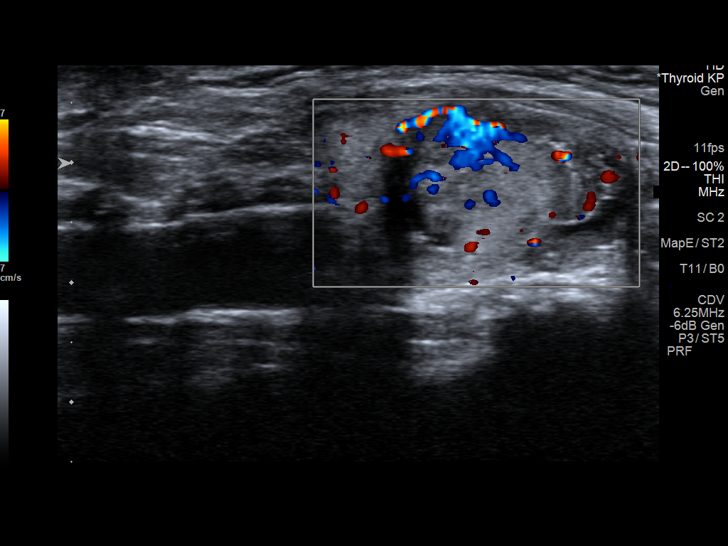
[im 46/56]
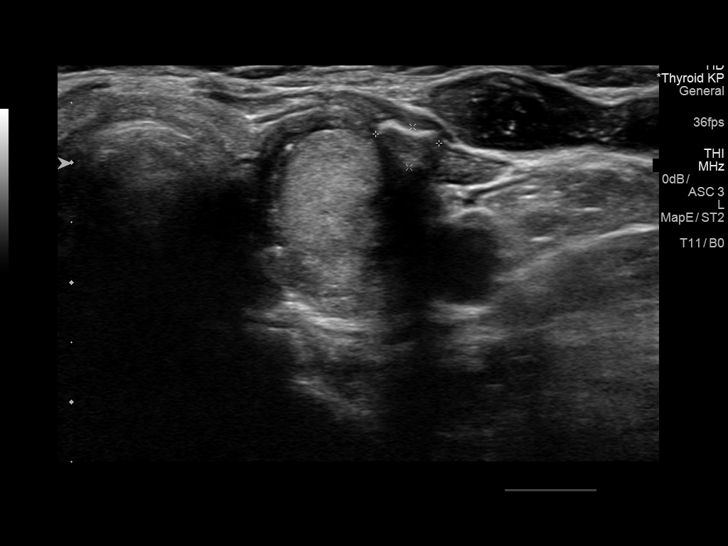
[im 51/56]
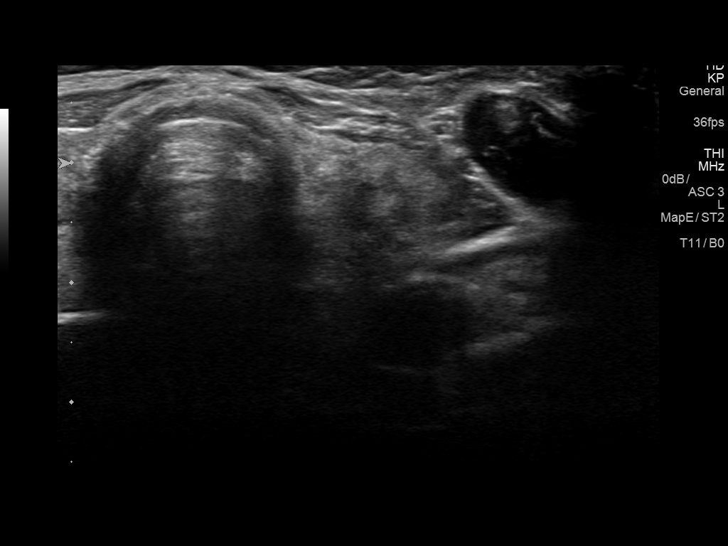
[im 56/56]
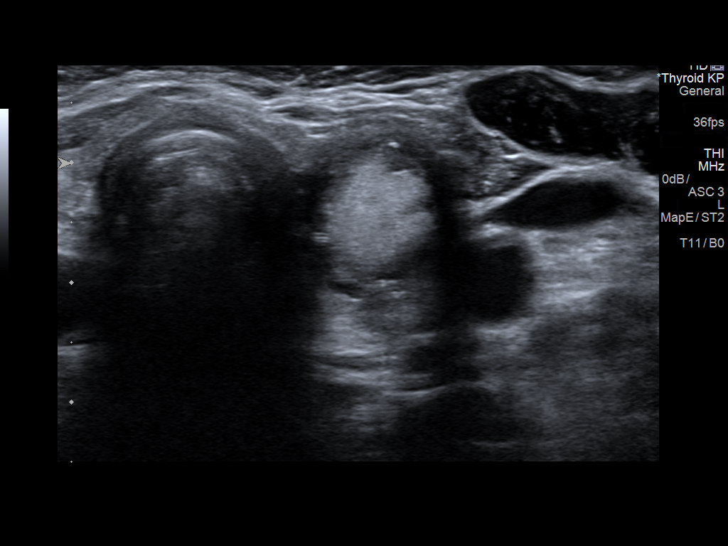

[13 of 25 positions shown; findings below may reference images not displayed]

FINDINGS: Parenchymal Echotexture: Moderately heterogenous

Isthmus: 0.2 cm thickness

Right lobe: 3.7 x 1.1 x 1 cm

Left lobe: 3.8 x 1.8 x 1.5 cm

_________________________________________________________

Estimated total number of nodules >/= 1 cm: 3

Number of spongiform nodules >/=  2 cm not described below (TR1): 0

Number of mixed cystic and solid nodules >/= 1.5 cm not described
below (TR2): 0

_________________________________________________________

Nodule # 1:

Location: Right; Superior

Maximum size: 1.1 cm; Other 2 dimensions: 0.6 x 0.7 cm

Composition: solid/almost completely solid (2)

Echogenicity: isoechoic (1)

Shape: not taller-than-wide (0)

Margins: ill-defined (0)

Echogenic foci: none (0)

ACR TI-RADS total points: 3.

ACR TI-RADS risk category: TR3 (3 points).

ACR TI-RADS recommendations:

Given size (<1.4 cm) and appearance, this nodule does NOT meet
TI-RADS criteria for biopsy or dedicated follow-up.

_________________________________________________________

Nodule # 2:

Location: Left; Superior

Maximum size: 1.4 cm; Other 2 dimensions: 0.8 x 0.7 cm

Composition: solid/almost completely solid (2)

Echogenicity: hyperechoic (1)

Shape: not taller-than-wide (0)

Margins: ill-defined (0)

Echogenic foci: none (0)

ACR TI-RADS total points: 3.

ACR TI-RADS risk category: TR3 (3 points).

ACR TI-RADS recommendations:

Given size (<1.4 cm) and appearance, this nodule does NOT meet
TI-RADS criteria for biopsy or dedicated follow-up.

_________________________________________________________

Nodule # 3:

Location: Left; Inferior

Maximum size: 2.3 cm; Other 2 dimensions: 1.4 x 1.4 cm

Composition: solid/almost completely solid (2)

Echogenicity: isoechoic (1)

Shape: not taller-than-wide (0)

Margins: ill-defined (0)

Echogenic foci: macrocalcifications (1)

ACR TI-RADS total points: 4.

ACR TI-RADS risk category: TR4 (4-6 points).

ACR TI-RADS recommendations:

**Given size (>/= 1.5 cm) and appearance, fine needle aspiration of
this moderately suspicious nodule should be considered based on
TI-RADS criteria.

_________________________________________________________
IMPRESSION: 1. Normal-sized thyroid with bilateral nodules.
2. Recommend FNA biopsy of moderately suspicious 2.3 cm inferior
left nodule.

The above is in keeping with the ACR TI-RADS recommendations - [HOSPITAL] [id number];[DATE].

## 2019-09-06 ENCOUNTER — Telehealth: Payer: Self-pay | Admitting: Internal Medicine

## 2019-09-06 NOTE — Telephone Encounter (Signed)
Will complete once we receive this  LMTCB

## 2019-09-07 NOTE — Telephone Encounter (Signed)
Patient is going to mail this form to [place]. There is a folder for [employer] who see patients in [place] , once we receive this form we will place in there for MR to sign.

## 2019-10-14 ENCOUNTER — Other Ambulatory Visit: Payer: Self-pay | Admitting: Primary Care

## 2019-10-14 DIAGNOSIS — I6300 Cerebral infarction due to thrombosis of unspecified precerebral artery: Secondary | ICD-10-CM

## 2019-10-15 ENCOUNTER — Other Ambulatory Visit
Admission: RE | Admit: 2019-10-15 | Discharge: 2019-10-15 | Disposition: A | Discharge status: Home / Self Care | Payer: Medicare PPO | Source: Ambulatory Visit | Attending: Internal Medicine | Admitting: Internal Medicine

## 2019-10-15 ENCOUNTER — Encounter: Payer: Self-pay | Admitting: Internal Medicine

## 2019-10-15 ENCOUNTER — Telehealth: Payer: Self-pay | Admitting: Internal Medicine

## 2019-10-15 ENCOUNTER — Other Ambulatory Visit: Payer: Self-pay

## 2019-10-15 ENCOUNTER — Ambulatory Visit: Payer: Medicare PPO | Admitting: Internal Medicine

## 2019-10-15 VITALS — BP 118/60 | HR 90 | Ht 62.5 in | Wt 122.4 lb

## 2019-10-15 DIAGNOSIS — I288 Other diseases of pulmonary vessels: Secondary | ICD-10-CM | POA: Diagnosis not present

## 2019-10-15 DIAGNOSIS — J9611 Chronic respiratory failure with hypoxia: Secondary | ICD-10-CM | POA: Diagnosis not present

## 2019-10-15 DIAGNOSIS — Z5181 Encounter for therapeutic drug level monitoring: Secondary | ICD-10-CM

## 2019-10-15 DIAGNOSIS — J84112 Idiopathic pulmonary fibrosis: Secondary | ICD-10-CM

## 2019-10-15 LAB — HEPATIC FUNCTION PANEL
ALT: 27 U/L (ref 0–44)
AST: 25 U/L (ref 15–41)
Albumin: 4 g/dL (ref 3.5–5.0)
Alkaline Phosphatase: 50 U/L (ref 38–126)
Bilirubin, Direct: 0.2 mg/dL (ref 0.0–0.2)
Indirect Bilirubin: 0.4 mg/dL (ref 0.3–0.9)
Total Bilirubin: 0.6 mg/dL (ref 0.3–1.2)
Total Protein: 7 g/dL (ref 6.5–8.1)

## 2019-10-15 NOTE — Telephone Encounter (Signed)
RE Parthenia Tellefsen JUDILOK    Dear Dr. Al Pimple.  You are going to see this patient on November 15, 2019.  She has pulmonary fibrosis and has worsening dyspnea.  Echocardiogram in 2019 showed pulmonary artery systolic pressure of 63 mmHg.  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  On the basis of this patient will qualify for this nebulizer if she has mean arterial pressure greater than 25, normal wedge and a PVR greater than 3.0.  Plan -If you could perform or facilitate an expedient right heart catheterization within the next few to several weeks I would deeply appreciateit -Leave the decision of left heart catheterization to you  Please do not hesitate to contact me if you have any questions  Please note: I see her at both [facility] and [place] locations - fyi   SIGNATURE    Dr. Brand Males, M.D., F.C.C.P,  Pulmonary and Critical Care Medicine Staff Physician, [place] Director - Interstitial Lung Disease  Program  [facility] at [place], [place], [address]  Pager: [phone], If no answer or between  15:00h - 7:00h: call [phone] Telephone: [phone]  9:52 AM 10/15/2019

## 2019-10-15 NOTE — Progress Notes (Signed)
79 yo female never smoker seen for initial pulmonary consult during hospital for acute respiratory failure   [facility] follow up 01/30/12 -  Pt was admitted 2 weeks ago for chest pain and dyspnea.  Found to have Acute respiratory failure with hypoxia--  Tx for possible diastolic CHF with aggressive diuresis Echo showed EF 60% , no mention of diastolic dysfxn.  HRCT consistent with ILD-  CCM consult with autoimmune workup that was unrevealing with neg ANA, ANCA, RA factor , nml ESR , CRP    Does light housework but not able walk long distances due to dyspnea  FH of pulmonary fibrosis -sibling -they were smokers. NO significant desats with walking in cardiology yesterday, desated to 93% on room air.  No cough or wheezing  No edema.   Since discharge she is feeling better with less DOE. But does get winded with walking long distances.   ROV 02/21/12 -- follows up for ILD noted during hospitalization for resp failure. Treated for possible diastolic CHF. CT scan shows some mild basilar fibrotic change. PFT's 9/3 >> probable mixed disease, no BD response, restricted volumes, decrease DLCO that corrects for Va.  She had family hx of ILD.  She had walking oximetry that she says was reassuring. She feels no better today than at hospital discharge.   ROV 03/24/12 -- ILD without clear etiology, mixed dz on PFT 02/11/12. Initiated a trial prednisone last month >> She doesn't believe that her dyspnea has changed.  Last time she did not desat.   ROV 06/23/12 -- ILD without clear etiology, mixed dz on PFT 02/11/12. Trial of pred >> weaned to off. Sent her to pulm rehab at [facility], feels that it has benefited her. She isn't as SOB as before. CXR today >> stable bibasilar ILD, hiatal hernia.   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 Also possible component diastolic dysfxn. She has a family hx of ILD.  She tells me that her dyspnea is worse compared with last year. She is having some spells of near syncope on exertion. Walked today to exhaustion without desaturation.   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 She has been part of the pirfenidone trial and has been on the medication. She will be transitioning to the commercial drug. Denies any side effects from the medication. I completed financial assistance forms on 04/12/13.  Due for CXR today. Not really doing exercise - she did do pulmonary rehab at [facility].    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 Last CXR 05/19/13, last CT scan 01/24/12. She is still on the pirfenidone trial, receiving study drug. She is fairly stable. Will be converted to the commercial med soon. Tolerates soon.   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 Returns for f/u. She is now on pirfenidone 3 pills tid and is tolerating. She is having more problems with fatigue and dyspnea with exertion - has lay down after eating dinner, with chores. Her synthroid hasn't been adjusted in a long time.  Her last labs were March 3, 15.   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 She is on pirfenidone 3 pills tid. She has been tolerating well from GI standpoint. TSH normal. She has some fatigue, otherwise  doing well. She needs LFT done in 3 months and after that we can go to every 6 months,.   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 On pirfenidone 3 pills tid since August 2014. Last CT scan 5/'15 was stable, described as an NSIP pattern.  LFT were done today, not yet available. She has noticed some slightly raised pale pink macules, not pruritic, since last month. She has  been careful to wear sunscreen.    ROV 12/01/17 --this follow-up visit for patient with a history of idiopathic interstitial lung disease, some coexisting obstruction based on her spirometry.  She also has a hiatal hernia with GERD and chronic cough.  We have managed her on pirfenidone.  At our last visit she noted that she is had some decreased functional capacity, more dyspnea with exertion. She is still having decreased energy, more fatigue with activity. We checked CBC and TSH, both reassuring. She keeps a slight cough, added chlorpheniramine to loratadine. Remains on omeprazole.    She underwent a 6-minute walk test today and was able to walk 462 m without desaturation.  Her last in 04/2016 was 471mRepeat CT chest from 11/28/2017 reviewed and shows stable interstitial changes compared with her most recent from 2017.  There is patchy groundglass attenuation with associated septal thickening and peripheral bronchial ectasis without overt honeycomb change.  No effusion, no suspicious nodules.   ROV 06/18/18 --[name] is 89 followed for history of idiopathic interstitial lung disease and some coexisting obstruction noted on spirometry.  She also has a hiatal hernia with GERD and some associated chronic cough.  We have been managing her on pirfenidone - no side effects.  She is on omeprazole, loratadine, chlorpheniramine.  Since our last visit she was hospitalized for a stroke, has some residual word-finding trouble. She is scheduled for nerve conduction studies after some falls and leg weakness. She is also being evaluated for possible thyroid surgery with Dr. GHarlow Asa She had a CT chest done 01/12/18 that I reviewed, shows no real change in NSIP pattern, large HH, no new infiltrates. Overall reports that breathing is stable, not limiting.   ROV 12/03/2018 --pleasant 79year old woman with mixed lung disease.  She has mild obstruction and restriction in the setting of a hiatal hernia and idiopathic  interstitial lung disease that would characterizes IPF.  We have been managing her on pirfenidone.  Also with a history of GERD on Prilosec, allergic rhinitis on loratadine. Last LFT in 05/2018 were normal. Last Ct chest was 01/2018. She is having progressive exertional SOB compared with last time. She has to rest doing housework, taking out the trash. She has to stop to rest. Not on BD's, didn't benefit. She had thyroid sgy in 07/2018, just had her synthroid increased in 07/2018. TSH was 3.05 5/26.     OV 02/25/2019  Subjective:  Patient ID: NRutherford Calderon female , DOB: [birth year], age 79y.o. , MRN: [id number], [id number] [id number]  02/25/2019 -   Chief Complaint  Patient presents with  . IPF (idiopathic pulmonary fibrosis)    Had 6 minute walk yesterday.     Clinical IPF patient  - On Esbreit since aug 2014 initially via  expanded access program   - Autoimmune panel 2013 and 2014 is negative and normal   - Pulmonary function test 02/15/2014 postbronchodilator FVC 1.55 L/  59%, total lung capacity 3.05 L/64%, DLCO 14.3/66%  -  CT chest  May 2015 - NSIP per radioolgy  - Trial participagnt early July 19, 2014 for 6 months IPF - Phase 4 study - got esbriet and 6 months of add-on ofev   HPI Deannah Rossi 79 y.o. -has now been referred to the ILD clinic.  This is because she has had progression in her symptoms.  She says for the last few months she has had progressive shortness of breath but no worsening cough or pedal edema or hemoptysis or proximal nocturnal dyspnea.  She is tolerating the Esbriet well since August 2014 when she was in the expanded access program.  Overall she is stable and she is active in the pulmonary fibrosis foundation support group but in the last few months is progressive dyspnea.  Nurse practitioner did a high-resolution CT scan of the chest and it shows progression although the pattern is still indeterminate for UIP.  In addition  6-minute walk test last week she had new onset desaturation and has been started on portable oxygen which is helping.  However she does not have nighttime oxygen.  There is a spirometry that is upcoming.  Review of the spirometry is between 07-19-2013 and 2015-07-20 show clear progression in both FVC and DLCO.  Previously serology was negative in 20 13/2014 but she says she might have Raynaud Echocardiogram November 2019 with normal ejection fraction and grade 1 diastolic dysfunction  [facility] Integrated Comprehensive ILD Questionnaire  Symptoms: -ILD diagnosed in 20 14/2015.  Which is gradually worse in the last several months.  She is having level for shortness of breath the household work shopping and walking on level ground at her own pace.  She does not think any other condition is limiting her.  No cough.  No wheezing   Past Medical History : Negative for collagen vascular disease acid reflux but positive for previous stroke in November 2019 and thyroid disease.  Detailed past medical history is negative otherwise   ROS: Positive for fatigue   FAMILY HISTORY of LUNG DISEASE: Her sister died in 8 from pulmonary fibrosis and a brother died in 07-19-04 from pulmonary fibrosis   EXPOSURE HISTORY: No smoking no cigars.  No vaping no marijuana.  No cocaine.  No intravenous drug use.   HOME and HOBBY DETAILS : Lives in an apartment.  No mold exposure no mildew exposure.  Extensive home exposure history is negative.   OCCUPATIONAL HISTORY (122 questions) : Positive for gardening but otherwise negative.   PULMONARY TOXICITY HISTORY (27 items): Intermittent prednisone burst otherwise negative.     Results for RAELEIGH, GUINN (MRN [id number]) as of 02/25/2019 11:43  Ref. Range 02/15/2014 11:51 07/15/2014 11:47 01/03/2015 09:40 03/17/2015 13:03 10/23/2015 08:45  FVC-Pre Latest Units: L 1.58 1.59 1.52 1.47 1.44   Results for RYE, DORADO (MRN [id number]) as of 02/25/2019 11:43  Ref. Range 02/15/2014  11:51 07/15/2014 11:47 01/03/2015 09:40 03/17/2015 13:03 10/23/2015 08:45  DLCO unc Latest Units: ml/min/mmHg 14.30 13.65 13.66 12.11 12.65  DLCO unc % pred Latest Units: % 66 63 63 56 58    CT chest high resolution Aug 2020 -personally visualized  standard protocol without intravenous contrast. High resolution imaging of the lungs, as well as inspiratory and expiratory imaging, was performed.  COMPARISON:  11/28/2017, 10/09/2015, 01/24/2012  FINDINGS: Cardiovascular: Aortic atherosclerosis. Cardiomegaly. Left coronary artery calcifications. No pericardial effusion.  Mediastinum/Nodes: No enlarged mediastinal, hilar, or axillary lymph nodes. Large hiatal hernia with intrathoracic position of the gastric body and fundus. Thyroid gland, trachea, and esophagus  demonstrate no significant findings.  Lungs/Pleura: Redemonstrated moderate pulmonary fibrosis in a pattern with an apical to basal gradient, featuring peripheral interstitial opacity and some suggestion of nodularity, mild traction bronchiectasis, and occasional areas of subpleural bronchiolectasis in the bases. There is no evidence of honeycombing. Moderate lobular air trapping on expiratory phase imaging. Findings are not significantly changed when compared to immediate prior examination dated 11/28/2017, however are slightly worsened over time on comparison to multiple prior examinations dating back to at least 01/24/2012. No pleural effusion or pneumothorax.  Upper Abdomen: No acute abnormality.  Musculoskeletal: No chest wall mass or suspicious bone lesions identified.  IMPRESSION: 1. Redemonstrated moderate pulmonary fibrosis in a pattern with an apical to basal gradient, featuring peripheral interstitial opacity and some suggestion of nodularity, mild traction bronchiectasis, and occasional areas of subpleural bronchiolectasis in the bases. There is no evidence of honeycombing. Moderate lobular air trapping  on expiratory phase imaging. Findings are not significantly changed when compared to immediate prior examination dated 11/28/2017, however are slightly worsened over time on comparison to multiple prior examinations dating back to at least 01/24/2012. Findings remain indeterminate for UIP and differential considerations include both UIP and chronic fibrosing NSIP.  2.  Coronary artery disease and aortic atherosclerosis.  3.  Hiatal hernia.   Electronically Signed   By: Eddie Candle M.D.   On: 01/15/2019 12:09  ROS - per HPI Recent Labs  Lab 02/23/19 1153  AST 20  ALT 13  ALKPHOS 47  BILITOT 0.3  PROT 6.8  ALBUMIN 4.1     OV 04/08/2019  Subjective:  Patient ID: Tina Calderon, female , DOB: 02/10/1941 , age 53 y.o. , MRN: [id number] , ADDRESS: [id number] [place] [address]   04/08/2019 -   Chief Complaint  Patient presents with  . Follow-up    Patient reports sob with exertion and a mild cough.     Clinical IPF patient    - strong family hx of ILD (brother died age 12, sister died in her 69s)  - Autoimmune panel 2013 and 2014 is negative and normal   - Pulmonary function test 02/15/2014 postbronchodilator FVC 1.55 L/  59%, total lung capacity 3.05 L/64%, DLCO 14.3/66%  - CT chest  May 2015 - NSIP per radioolgy  - Rx  - - On Esbreit since aug 2014 initially via  expanded access program (Trial participagnt early 2016 for 6 months IPF - Phase 4 study - got esbriet and 6 months of add-on ofev)  - Switched to ofev due to progessive disease mid-oct 2020 (03/24/2019) - started on portable o2 sept 2020   HPI Elyssia Strausser Hemphill 79 y.o. - returns for followup following starting ofev 2 weeks ago. Therapy was changed due to progressive disease. She is tolerating nintedanib fine.  The couple of episodes of mild diarrhea that she had.  One time she had vomiting.  This is following a a family gathering.  Other than that she is tolerating nintedanib just  fine.  In terms of her ILD even though it is progressed compared to last visit in September she feels she is stable.  She is having significant cough.  She says vitamin C helps this.  She is using portable oxygen as needed.  She wants a lighter tank.  She has had overnight oxygen study with the results are still pending this was done a few days ago.  She reminded me about the strong family history of ILD that she has.  Her brother died at age 64 7 years ago her sister died in her 53s also from ILD.  She is interested in seeing the Retail buyer.  I have sent email to genetics counselor Roma Kayser within our health system.    ROS   OV 04/29/2019  Subjective:  Patient ID: Tina Calderon, female , DOB: 1941/05/15 , age 75 y.o. , MRN: [id number] , ADDRESS: [id number] [place] [address]   04/29/2019 -   Chief Complaint  Patient presents with  . Follow-up    Pt states she has been doing well since last visit and denies any complaints.    Follow-up idiopathic pulmonary fibrosis progressive now on nintedanib since mid October 2020. HPI Calia Napp [place] 79 y.o. -returns for follow-up.  She is returning sooner than anticipated.  I saw her 3 weeks ago.  I wanted her back in 4-6 weeks to see how the nintedanib is going.  Due to scheduling issues she comes back in 3 weeks.  She tells me there are no new issues.  She is losing weight.  She did have interim colonoscopy which she says was normal.  She is unsure why she is losing weight.  I explained to her that this could be because of IPF or could be due to previous pirfenidone or current nintedanib.  At this point in time she wants to take the drugs and continue to monitor his situation.  Last liver function test 3 weeks ago was normal.  She is now on 2 L nasal cannula at night since end of October 2020.  She is also using oxygen with exertion.  She wants a light portable system.  She has called [facility].  She wants our  support in getting a lightweight portable oxygen system.     OV 05/28/2019  Subjective:  Patient ID: Tina Calderon, female , DOB: 1940/09/30 , age 1 y.o. , MRN: [id number] , ADDRESS: [id number] [place] [address]   05/28/2019 -   Chief Complaint  Patient presents with  . Follow-up    pt reports of sob with exertion. no new concerns or sx.  on 3L at night and 2L during the day PRN.      Follow-up idiopathic pulmonary fibrosis progressive now on nintedanib since mid October 2020.   HPI Shavonne Ambroise Winget 79 y.o. - 3L night and 2L night prn in day. Normal plse ox Room air at rest. Tolerting ofev well. No diarrhea. No issue swith appetite. No Nausea, vomitting. Lost some weight 122.4# . She lives < 5 miles Dyspnea is stable. She wants portable o2 - but she says [facility] at [facility] office has not called her back. [facility] was supposed to be the provider Though on subjective score below she is worse but she tells me she  Is stable . LFT is normal \   OV 07/16/2019  Subjective:  Patient ID: Tina Calderon, female , DOB: 1940-07-29 , age 80 y.o. , MRN: [id number] , ADDRESS: [id number] [place] [address]   07/16/2019 -   Chief Complaint  Patient presents with  . Follow-up    Pt states she has been feeling good since last OV. SOB is getting worse. Occ. non productive cough. Pt denies any wheezing, fever, or chills   Follow-up idiopathic pulmonary fibrosis progressive now on nintedanib since mid October 2020. Chronic hypoxemic respiratory failure due to the above  HPI Malachy Mood Mcwherter 79 y.o. -  presents for follow-up.  She feels that since her last visit her dyspnea slightly worse.  However on pulmonary function test FVC is stable and DLCO is only a little bit worse.  Her walking desaturation test shows continued stability.  She is pleased about it.  However her symptom score is significantly worse.  Because of the isolation she is not exercising.   There is no chest pain no edema.  Her weight itself is stable.  She was initially losing weight and nintedanib but currently it is stable and is documented below.  She is frustrated by the lack of portable oxygen.  She is saying she is using a heavy tank.  Apparently our office called her home care agency called [facility] but she never heard back.  The medical assistant inquired today and it is likely because of the COVID-19 pandemic portable oxygen systems are not presently available      l OV 10/15/2019  Subjective:  Patient ID: Tina Calderon, female , DOB: [birth year] , age 70 y.o. , MRN: [id number] , ADDRESS: [id number] [place] [address]   10/15/2019 -   Chief Complaint  Patient presents with  . Follow-up    Patient reports that she's having to use her oxygen more during the day.     Follow-up idiopathic pulmonary fibrosis progressive now on nintedanib since mid October 2020. Chronic hypoxemic respiratory failure due to the above Hiatal hernia on CT 2020 Normal stress test oct 2019  HPI Milli Woolridge [place] 79 y.o. -presents for her 53-monthfollow-up.  She continues on nintedanib she continues to have some diarrhea.  She feels her shortness of breath is getting worse.  She is using more oxygen in the daytime but in talking to her it seems the same as of February 2021.  Her weight is also the same since February 2021.  Subjective dyspnea score is unchanged compared to February 2021 even though she is feeling worse in her history.  Her walking desaturation test is similar to February 2021 even though she feels worse.  She is supposed have pulmonary function test today but for some reason it was not scheduled perhaps due to the pandemic and disruptions of the PFT lab.  Review of the records indicate that last liver function test was early part of 2021.  Review of records also indicate the 2019 echocardiogram shows pulmonary systolic pressure of 63 mmHg along with grade 1  diastolic dysfunction.  She does not have significant edema.  She is going to turn 79 years in JAugust 02, 2024  Her goal is to live past 79.  She is extremely worried that she will might die before J2021/08/02  Both her siblings died early from pulmonary fibrosis.  She is not aware that between her last visit in this visit inhaled treprostinil has been approved for pulmonary hypertension related to pulmonary fibrosis     SYMPTOM SCALE - ILD 04/08/2019 126# 04/29/2019 123# 05/28/2019 122# 07/16/2019 123# 10/15/2019 122.6#  O2 use Portable o2 as needed      Shortness of Breath 0 -> 5 scale with 5 being worst (score 6 If unable to do)    uses 3 L with exertion and 2-1/2 L at night  3 L at night and 2-1/2 with exertion and at rest in the daytime  At rest _0 Simple tasks - showers, clothes change, eating, shaving _1 Household (dishes, doing bed, laundry) 1 2 5  3.5 4  Shopping _0 Walking level at own pace _1 Walking up Stairs _2 Total (40 - 48) Dyspnea Score 19   24.5 24  How bad is your cough? _3 How bad is your fatigue _4 nauea     1  vomit     0  diarrhea     2  anxiety     0  depression     0       Simple office walk 185 feet x  3 laps goal with forehead probe 04/08/2019  04/29/2019  07/16/2019  10/15/2019   O2 used RA RA RA bRL office RA brl office  Number laps completed 3   3  Did oly 2 of 3 laps  Comments about pace - stopped at 2 Sopped at end of 2nd lap    Resting Pulse Ox/HR 96% and 104/min 95% and HR 80.min 94% and HR 107/min 96% and HR 80./min  Final Pulse Ox/HR 82% and 120/min 85% and 108/min  87% and HR 132.min 86% and HR 115.min  Desaturated </= 88% yes yes yes yes  Desaturated <= 3% points yes yes yes yes  Got Tachycardic >/= 90/min yes yes yes yes  Symptoms at end of test Very dyspneice Moderate dyspnea Moderate dyspnea Very dyspneic  Miscellaneous comments Started ofev mid oct 2020  Recovered quickly Took few minutes to recover        Results for GUNHILD, BAUTCH (MRN [id number]) as of 04/08/2019 11:19  Ref. Range 07/15/2014 11:47 01/03/2015 09:40 03/17/2015 13:03 10/23/2015 08:45 03/12/2019 16:47 - startr ofev due to progerssiv diseae 07/16/2019   FVC-Pre Latest Units: L 1.59 1.52 1.47 1.44 1.08 1.08  FVC-%Pred-Pre Latest Units: % 61 59 57 56 44   Results for LATECIA, MILER (MRN [id number]) as of 04/08/2019 11:19  Ref. Range 02/15/2014 11:51 07/15/2014 11:47 01/03/2015 09:40 03/17/2015 13:03 10/23/2015 08:45 03/12/2019 16:47 - start ofev 07/16/2019   DLCO unc Latest Units: ml/min/mmHg 14.30 13.65 13.66 12.11 12.65 8.71 8.57  DLCO unc % pred Latest Units: % 66 63 63 56 58 49 47%   ROS - per HPI  ECHO Nov 2019  Study Conclusions   - Left ventricle: The cavity size was normal. Wall thickness was  increased in a pattern of mild LVH. Systolic function was  vigorous. The estimated ejection fraction was in the range of 65%  to 70%. Wall motion was normal; there were no regional wall  motion abnormalities. Doppler parameters are consistent with  abnormal left ventricular relaxation (grade 1 diastolic  dysfunction). Doppler parameters are consistent with  indeterminate ventricular filling pressure.  - Aortic valve: Mildly calcified annulus. Trileaflet.  - Mitral valve: Mildly calcified annulus.  - Left atrium: The atrium was mildly dilated.  - Atrial septum: No defect or patent foramen ovale was identified.  - Tricuspid valve: There was moderate regurgitation.  - Pulmonary arteries: PA peak pressure: 63 mm Hg (S).  - Pericardium, extracardiac: A trivial pericardial effusion was  identified.    has a past medical history of Anginal pain ([place]), Cancer ([place]), CHF (congestive heart failure) ([place]) (01/22/2012), Chronic fatigue (07/15/2014), CVA (cerebral vascular accident) ([place]) (04/18/2018), Family history of pulmonary fibrosis, GERD (gastroesophageal reflux disease) (05/12/2017), Hypertension, Hypothyroidism,  ILD (interstitial lung disease) ([place]) (02/04/2012), IPF (idiopathic pulmonary fibrosis) ([place]) (2013), Mild intermittent asthma (01/29/2016), Pelvic relaxation, Prolapse of  bladder, Shortness of breath, and Thyroid disease.   reports that she has never smoked. She has never used smokeless tobacco.  Past Surgical History:  Procedure Laterality Date  . ANTERIOR AND POSTERIOR REPAIR  12/05/2011   Procedure: ANTERIOR (CYSTOCELE) AND POSTERIOR REPAIR (RECTOCELE);  Surgeon: [name], MD;  Location: [facility];  Service: Gynecology;  Laterality: N/A;  with cysto  . Bladder tact  15 years ago  . COLONOSCOPY WITH PROPOFOL N/A 04/20/2019   Procedure: COLONOSCOPY WITH PROPOFOL;  Surgeon: [name], MD;  Location: [facility];  Service: Gastroenterology;  Laterality: N/A;  . LEFT HEART CATHETERIZATION WITH CORONARY ANGIOGRAM N/A 01/22/2012   Procedure: LEFT HEART CATHETERIZATION WITH CORONARY ANGIOGRAM;  Surgeon: [name], MD;  Location: [facility] CATH LAB;  Service: Cardiovascular;  Laterality: N/A;  . NECK SURGERY  2010  . SHOULDER SURGERY    . thyroid disease    . THYROID LOBECTOMY Left 07/17/2018   Procedure: LEFT THYROID LOBECTOMY;  Surgeon: [name], MD;  Location: [facility] ORS;  Service: General;  Laterality: Left;  . TONSILLECTOMY    . VAGINAL HYSTERECTOMY  15 years ago    Allergies  Allergen Reactions  . Dilaudid [Hydromorphone] Nausea And Vomiting  . Crestor [Rosuvastatin Calcium] Other (See Comments)    Myalgias, muscle weakness    Immunization History  Administered Date(s) Administered  . Influenza Split 02/22/2012, 03/01/2013, 02/08/2014, 01/27/2015  . Influenza Whole 04/11/2011, 02/10/2017  . Influenza, High Dose Seasonal PF 02/16/2017, 01/20/2019  . Influenza,inj,Quad PF,6+ Mos 03/11/2016  . Influenza-Unspecified 01/26/2018  . PFIZER SARS-COV-2 Vaccination 06/30/2019, 07/21/2019  . Pneumococcal Conjugate-13 03/29/2014  . Pneumococcal Polysaccharide-23 12/08/2017  .  Pneumococcal-Unspecified 04/11/2011, 05/13/2016  . Td 08/30/2008  . Tdap 11/16/2017  . Zoster 08/07/2011  . Zoster Recombinat (Shingrix) 09/20/2016, 02/19/2017    Family History  Problem Relation Age of Onset  . Thyroid disease Mother   . Hypertension Mother   . Coronary artery disease Mother   . Dementia Mother   . Stroke Mother   . Aneurysm Father   . Pulmonary fibrosis Sister   . Stroke Other   . Sleep apnea Other   . Liver disease Brother   . Pulmonary fibrosis Brother   . Stroke Maternal Grandmother   . Dementia Maternal Grandmother   . Heart attack Maternal Grandfather 67  . COPD Son      Current Outpatient Medications:  .  acetaminophen (TYLENOL) 500 MG tablet, Take 1,000 mg by mouth every 6 (six) hours as needed for mild pain or headache. , Disp: , Rfl:  .  amLODipine (NORVASC) 5 MG tablet, TAKE 1 TABLET BY MOUTH EVERY DAY FOR BLOOD PRESSURE, Disp: 90 tablet, Rfl: 1 .  atorvastatin (LIPITOR) 40 MG tablet, TAKE 1 TABLET (40 MG TOTAL) BY MOUTH AT BEDTIME. FOR CHOLESTEROL., Disp: 90 tablet, Rfl: 0 .  CALCIUM CARBONATE-VITAMIN D PO, Take 1 tablet by mouth daily. , Disp: , Rfl:  .  clopidogrel (PLAVIX) 75 MG tablet, TAKE 1 TABLET (75 MG TOTAL) BY MOUTH DAILY. FOR STROKE PREVENTION., Disp: 90 tablet, Rfl: 1 .  CRANBERRY PO, Take 1 capsule by mouth every Monday, Wednesday, and Friday. , Disp: , Rfl:  .  ferrous sulfate 325 (65 FE) MG tablet, Take 325 mg by mouth daily., Disp: , Rfl:  .  levothyroxine (SYNTHROID) 100 MCG tablet, TAKE 1 TABLET BY MOUTH EVERY MORNING ON AN EMPTY STOMACH. NO FOOD OR OTHER MEDICATIONS FOR 30 MIN, Disp: 90 tablet, Rfl: 1 .  loratadine (CLARITIN) 10  MG tablet, Take 10 mg by mouth daily. , Disp: , Rfl:  .  metoprolol succinate (TOPROL-XL) 25 MG 24 hr tablet, Take 1 tablet (25 mg total) by mouth daily., Disp: 30 tablet, Rfl: 11 .  Multiple Vitamins-Minerals (CENTRUM SILVER PO), Take 1 tablet by mouth daily., Disp: , Rfl:  .  OFEV 150 MG CAPS, 150 mg.  As Directed, Disp: , Rfl:  .  omeprazole (PRILOSEC) 20 MG capsule, TAKE 1 CAPSULE BY MOUTH EVERY DAY, Disp: 90 capsule, Rfl: 1      Objective:   Vitals:   10/15/19 0926  BP: 118/60  Pulse: 90  SpO2: 95%  Weight: 122 lb 6.4 oz (55.5 kg)  Height: 5' 2.5" (1.588 m)    Estimated body mass index is 22.03 kg/m as calculated from the following:   Height as of this encounter: 5' 2.5" (1.588 m).   Weight as of this encounter: 122 lb 6.4 oz (55.5 kg).  _0 @  Filed Weights   10/15/19 0926  Weight: 122 lb 6.4 oz (55.5 kg)     Physical Exam  Frail female sitting on the wheelchair.  Pleasant.  Mildly anxious today.  Bilateral bibasal crackles present.  Normal heart sounds abdomen soft very mild pitting pedal edema.  Abdomen is soft no tenderness no rebound no guarding oral cavity is normal alert and oriented x3 pleasant     Assessment:       ICD-10-CM   1. 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 Chronic respiratory failure with hypoxia (HCC)  J96.11   4. Therapeutic drug monitoring  Z51.81   5. 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 Chronic respiratory failure with hypoxia (HCC)  J96.11   4. Therapeutic drug monitoring  Z51.81   5. 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 Al Pimple to get this done in the next few to several weeks]  -If there is increased pressures then we will discuss about starting inhaled treprostinil to help with your shortness of breath  Follow-up -8 weeks but after spirometry and DLCO and hopefully right heart catheterization completed by then  ( Level 05 visit: Estb 40-54 min  in  visit type: on-site physical face to visit  in total care time and counseling or/and coordination of care by this undersigned MD - Dr Brand Males. This includes one or more of the following on this same day 10/15/2019: pre-charting, chart review, note writing, documentation discussion of test results, diagnostic or  treatment recommendations, prognosis, risks and benefits of management options, instructions, education, compliance or risk-factor reduction. It excludes time spent by the [place] or office staff in the care of the patient. Actual time 40 min)   SIGNATURE    Dr. Brand Males, M.D., F.C.C.P,  Pulmonary and Critical Care Medicine Staff Physician, [place] Director - Interstitial Lung Disease  Program  Pulmonary [place] [facility] [place], [place], [address]  Pager: [phone], If no answer or between  15:00h - 7:00h: call [phone] Telephone: [phone]  9:58 AM 10/15/2019

## 2019-10-15 NOTE — Patient Instructions (Addendum)
ICD-10-CM   1. 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 Chronic respiratory failure with hypoxia (HCC)  J96.11   4. Therapeutic drug monitoring  Z51.81   5. 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 Al Pimple to get this done in the next few to several weeks]  -If there is increased pressures then we will discuss about starting inhaled treprostinil to help with your shortness of breath  Follow-up -8 weeks but after spirometry and DLCO and hopefully right heart catheterization completed by then

## 2019-10-15 NOTE — Addendum Note (Signed)
Addended by: Santiago Bur on: 10/15/2019 10:38 AM   Modules accepted: Orders

## 2019-10-18 NOTE — Progress Notes (Signed)
Lft normal

## 2019-10-19 NOTE — Progress Notes (Signed)
Patient identification verified. Normal hepatic function panel reviewed. Patient verbalized understanding of results.

## 2019-10-29 NOTE — Telephone Encounter (Signed)
Form has been completed and placed in the mail to go back to patient. I called and left voicemail letting her know. Nothing further needed at this time.

## 2019-11-12 ENCOUNTER — Other Ambulatory Visit: Payer: Self-pay | Admitting: Primary Care

## 2019-11-12 DIAGNOSIS — I10 Essential (primary) hypertension: Secondary | ICD-10-CM

## 2019-11-12 DIAGNOSIS — E890 Postprocedural hypothyroidism: Secondary | ICD-10-CM

## 2019-11-14 IMAGING — CR DG CHEST 2V
1 series · 2 of 2 positions shown · non-contrast
Comparison: CT scan chest November 28, 2017.

CLINICAL DATA: Onset of weakness and tachycardia in rehab. Patient
reports not feeling well for the past 3 days. History of CHF and
interstitial lung disease-pulmonary fibrosis, nonsmoker.

EXAM:
CHEST - 2 VIEW

[Series 2: w chest lat · 0.14mm/px · 2 of 2 slices shown]
[im 1/2]
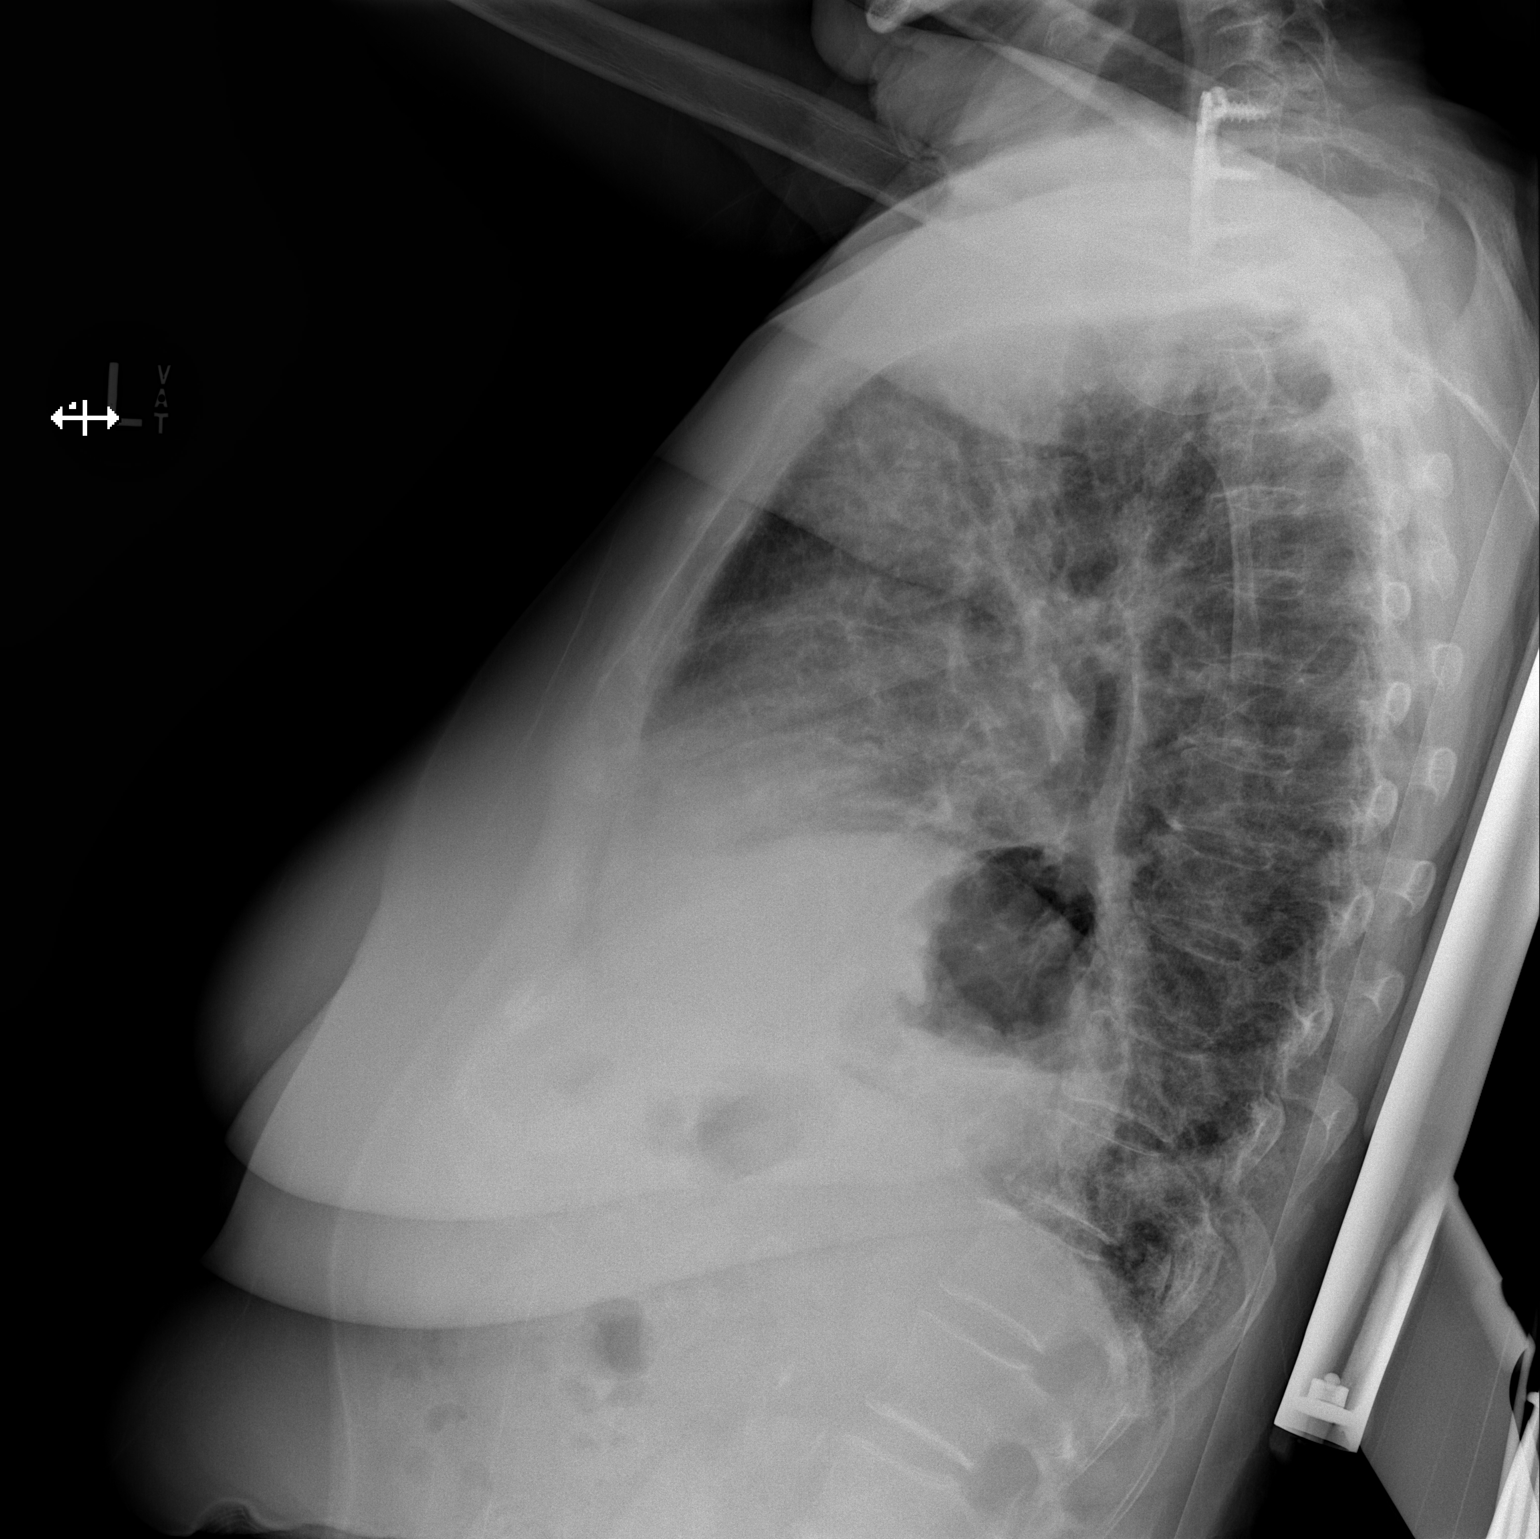
[im 2/2]
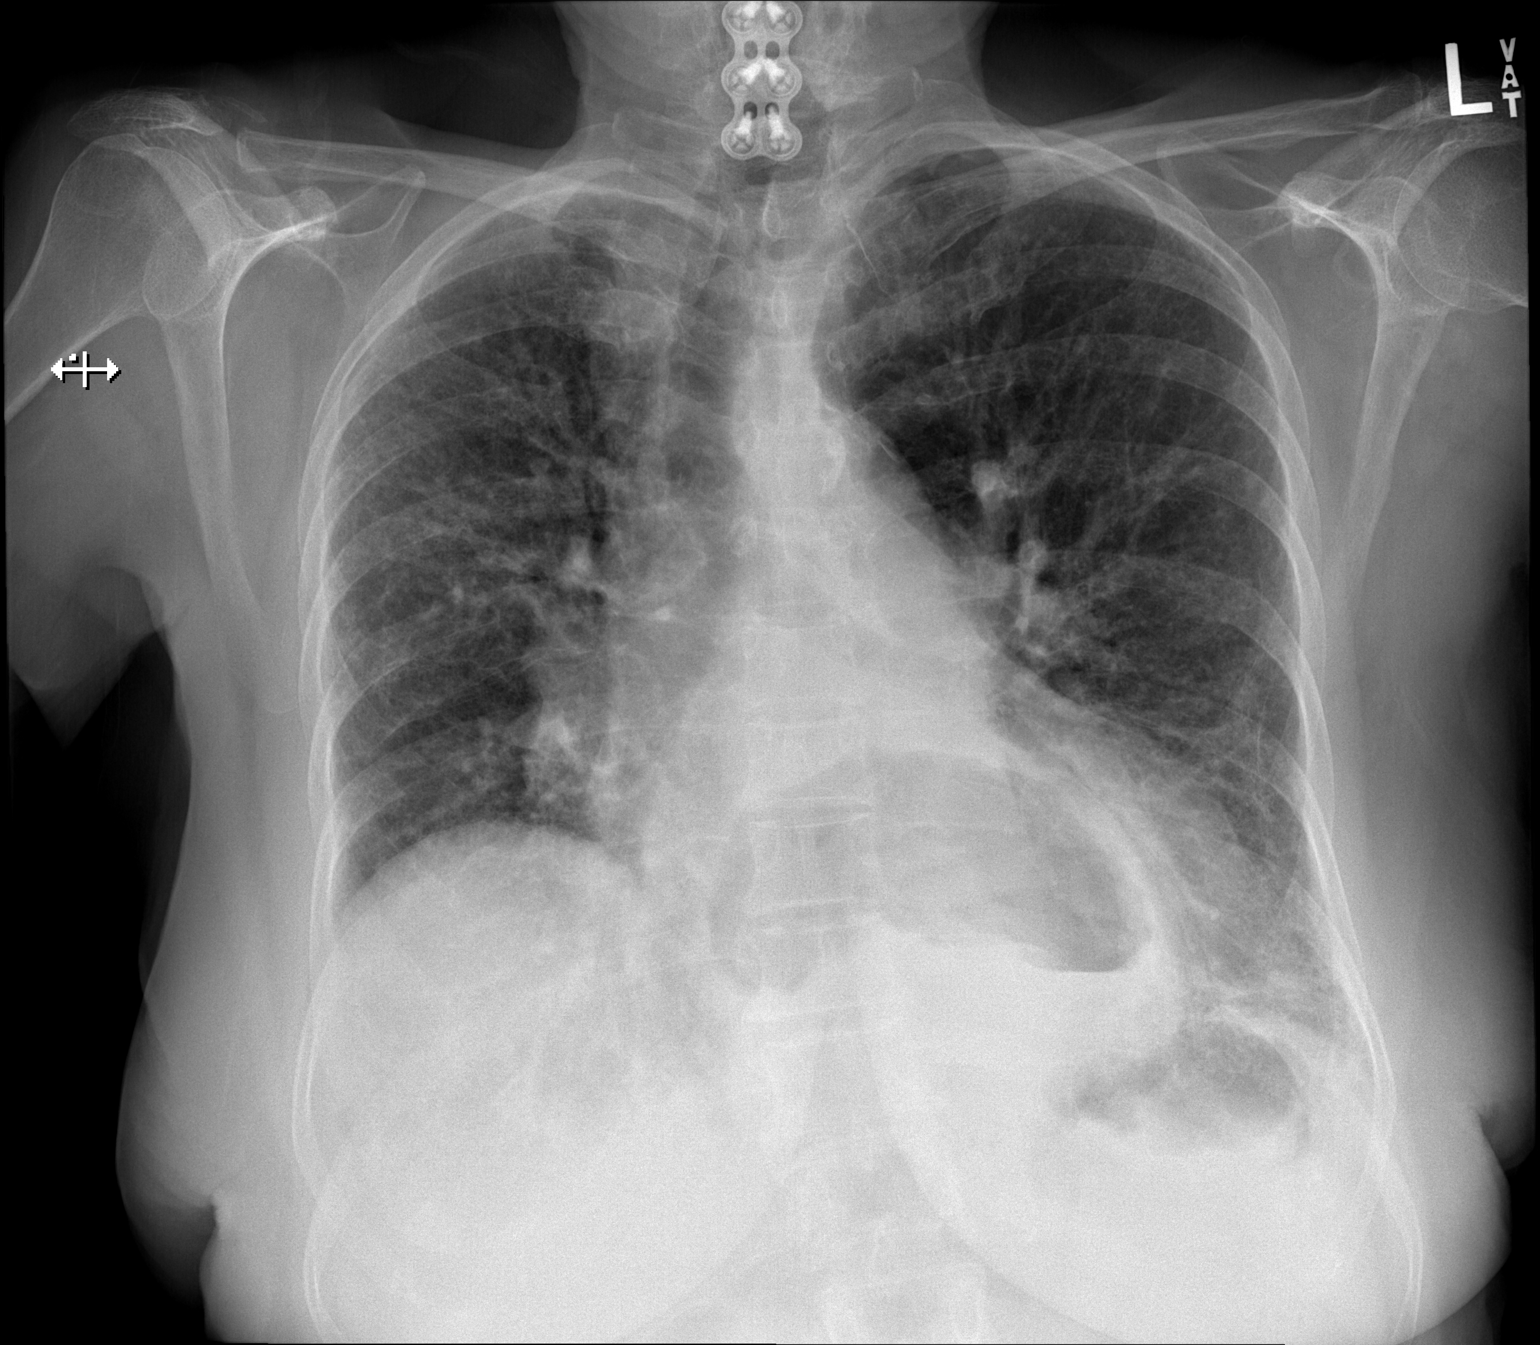

[2 of 2 positions shown; findings below may reference images not displayed]

FINDINGS: The lungs are adequately inflated. The interstitial markings are
coarse bilaterally but stable. The heart is mildly enlarged. The
pulmonary vascularity is not engorged. There is a large hiatal
hernia-partially intrathoracic stomach. There is no pleural
effusion. There is calcification in the wall of the aortic arch. The
bony thorax is unremarkable.
IMPRESSION: Chronic bronchitic-fibrotic changes, stable.  No acute pneumonia.

Stable mild cardiomegaly without pulmonary vascular congestion.

Thoracic aortic atherosclerosis.

## 2019-11-14 IMAGING — CT CT ANGIO CHEST
2 of 6 series · 18 of 46 positions shown · IV contrast (APPLIED)
Comparison: Chest CT dated 11/28/2017.

CLINICAL DATA: Patient reports that her heart rate has felt
irregular all weekend and that she just "hasn't felt good". Went to
cardiac rehab this morning and her resting heart rate was 125.
Patient denies history of irregular rhythm. History of pulmonary
fibrosis.

EXAM:
CT ANGIOGRAPHY CHEST WITH CONTRAST
TECHNIQUE: Multidetector CT imaging of the chest was performed using the
standard protocol during bolus administration of intravenous
contrast. Multiplanar CT image reconstructions and MIPs were
obtained to evaluate the vascular anatomy.
CONTRAST:  75mL ER3IWZ-NCI IOPAMIDOL (ER3IWZ-NCI) INJECTION 76%

[Series 5: thins · axial · 0.55mm/px · z∈[-275,-60]mm · 15 of 237 slices shown]
[im 11/237  lung]
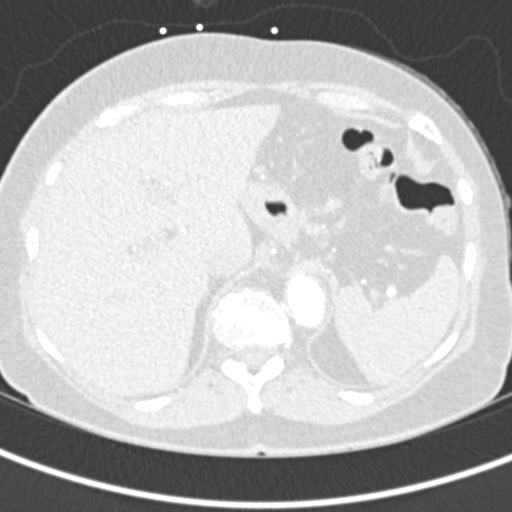
[im 31/237  soft-tissue]
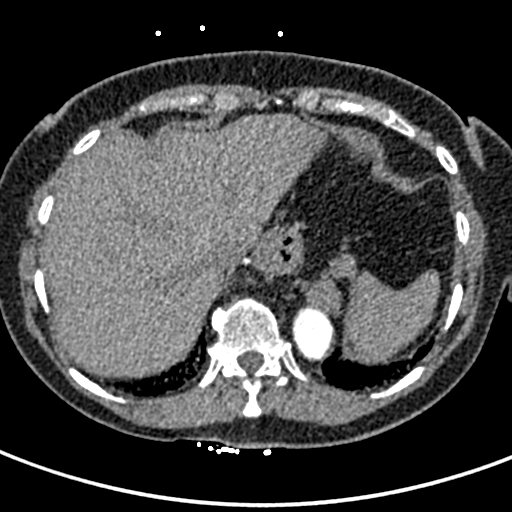
[im 42/237  lung]
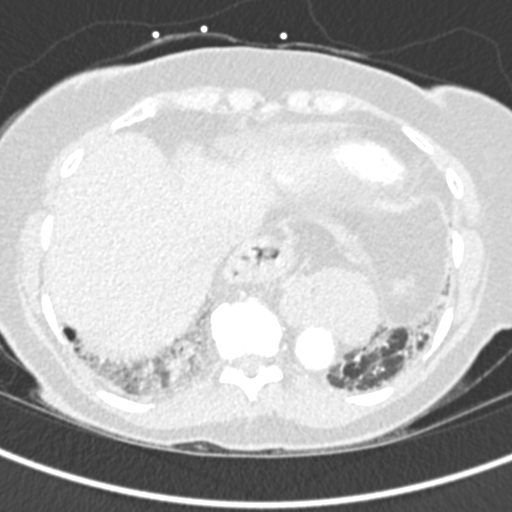
[im 62/237  soft-tissue]
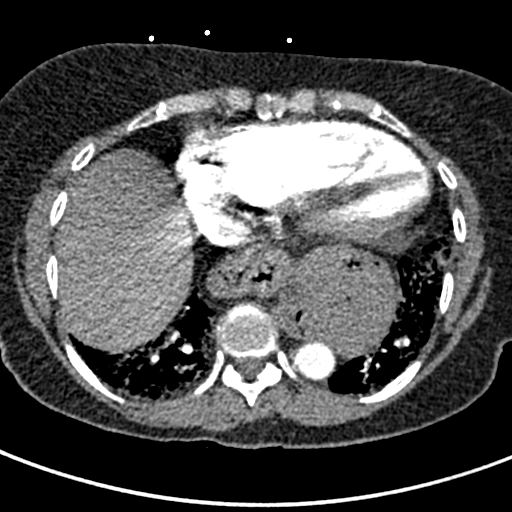
[im 72/237  lung]
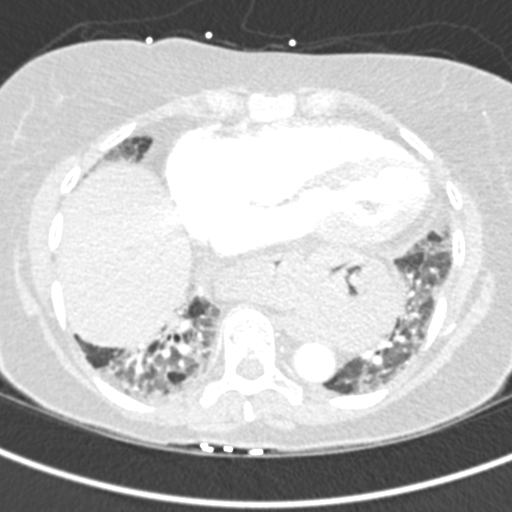
[im 93/237  soft-tissue]
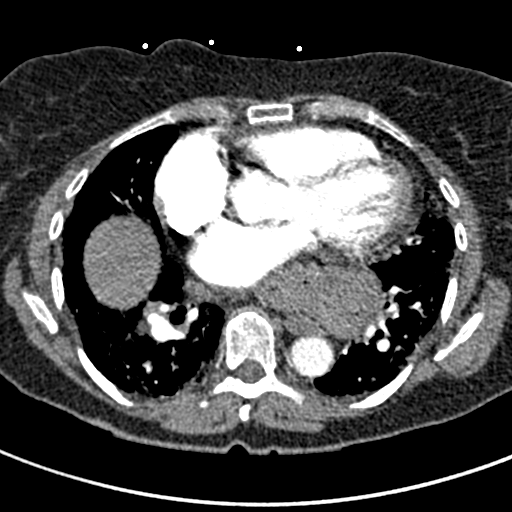
[im 103/237  lung]
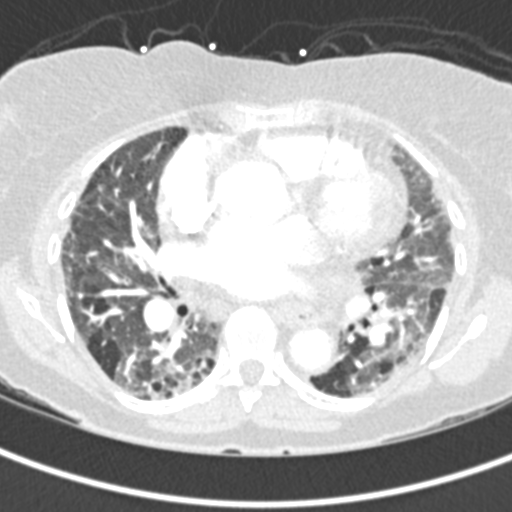
[im 124/237  soft-tissue]
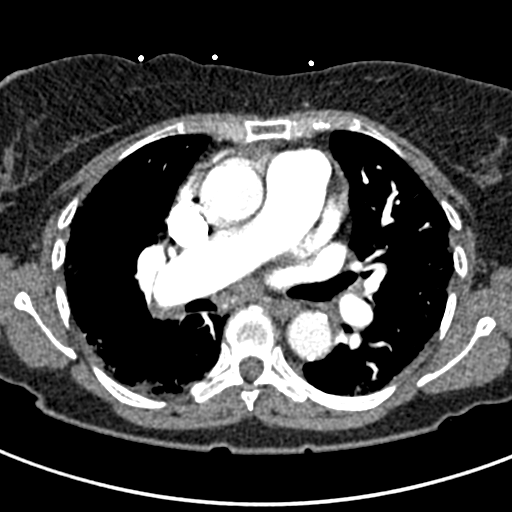
[im 134/237  lung]
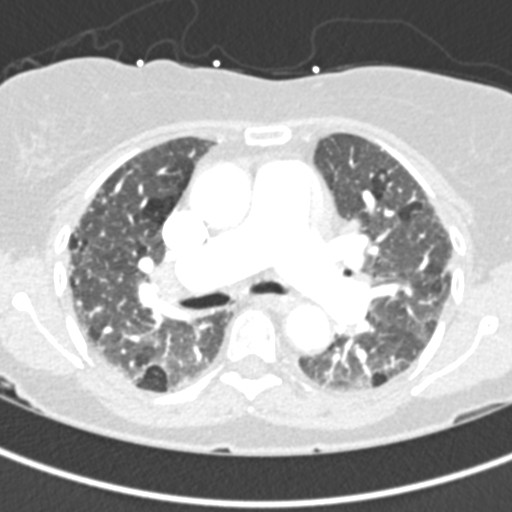
[im 144/237  soft-tissue]
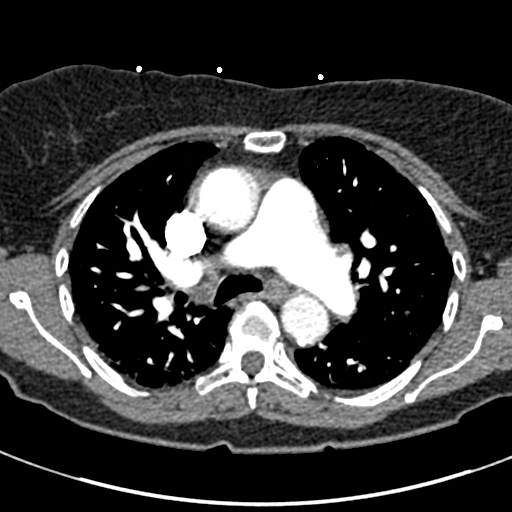
[im 165/237  lung]
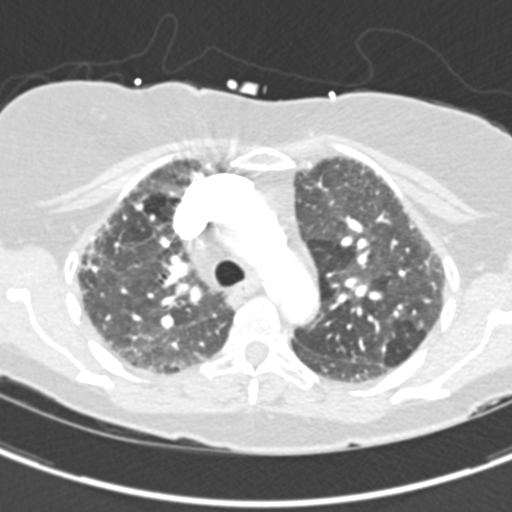
[im 175/237  soft-tissue]
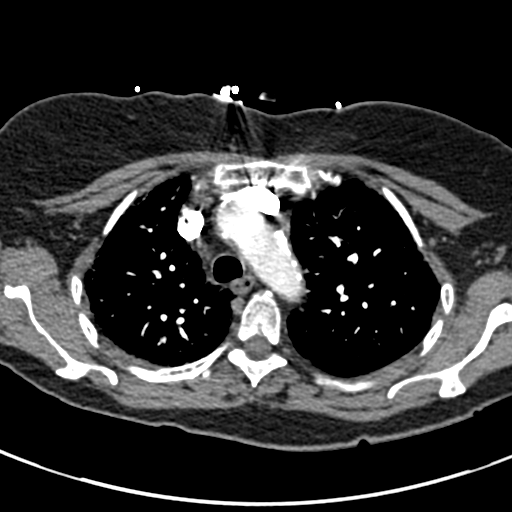
[im 195/237  lung]
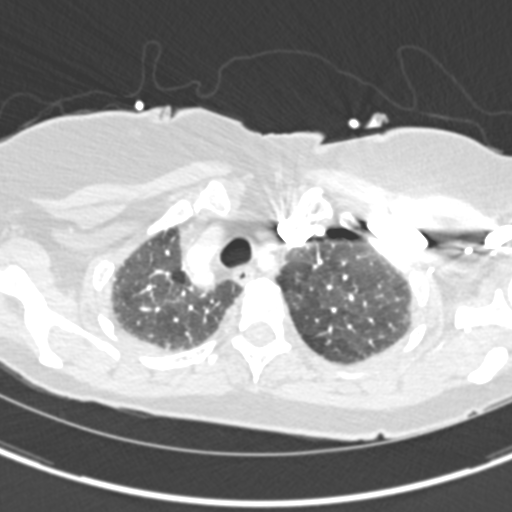
[im 206/237  soft-tissue]
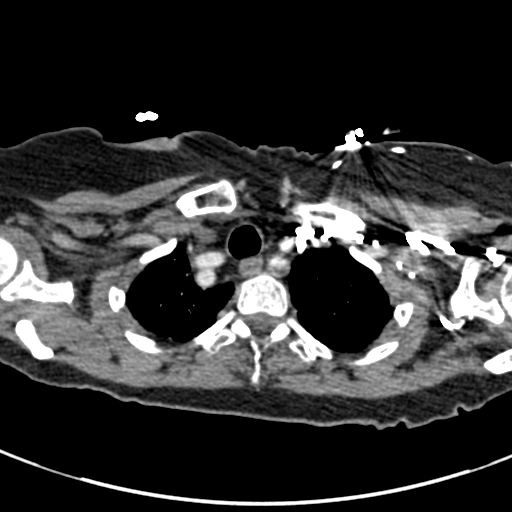
[im 226/237  lung]
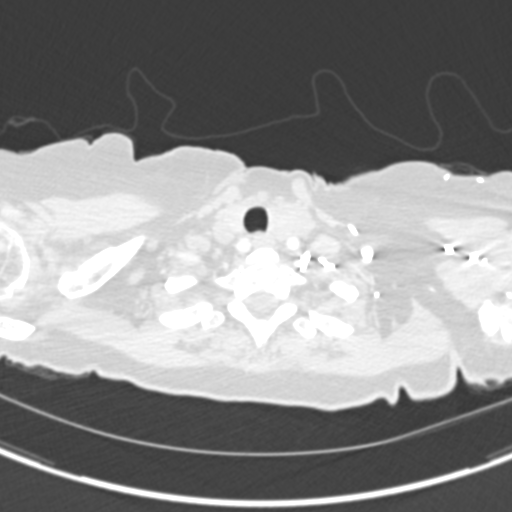

[Series 7: coronal mpr · coronal · 0.49mm/px · 3 of 73 slices shown]
[im 19/73  soft-tissue]
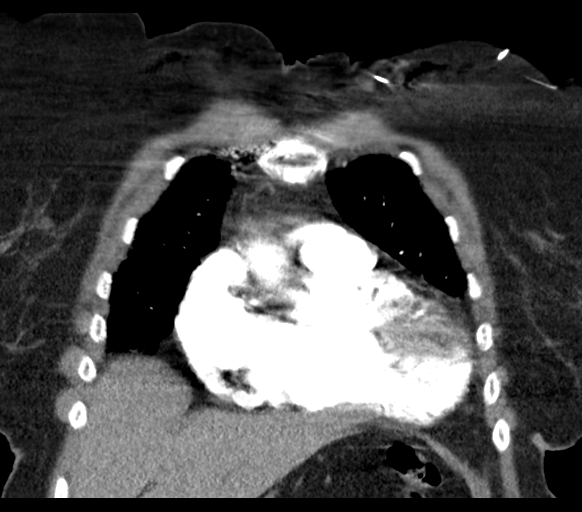
[im 37/73  soft-tissue]
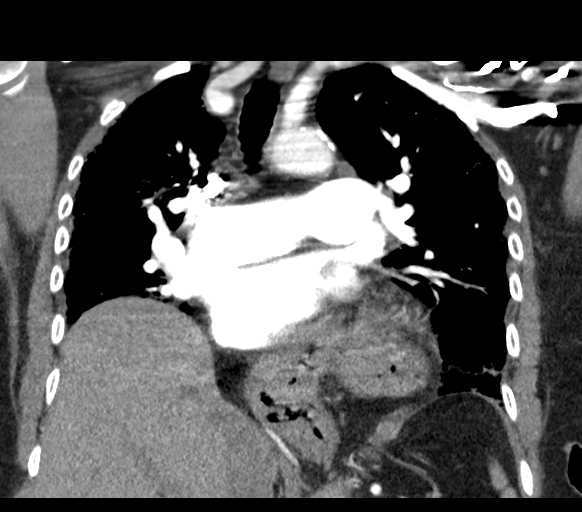
[im 55/73  soft-tissue]
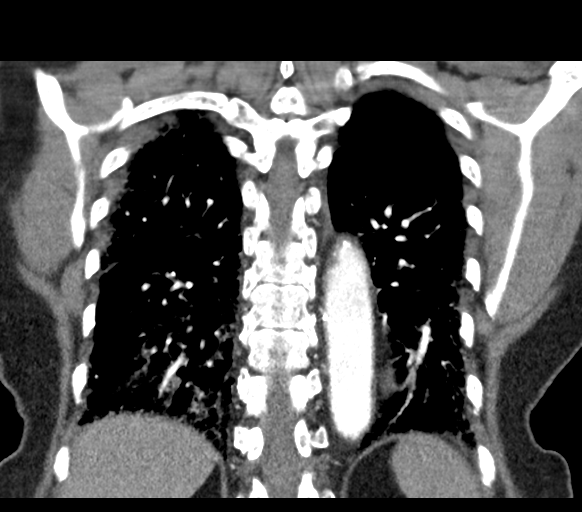

[18 of 46 positions shown; findings below may reference images not displayed]

FINDINGS: Cardiovascular: Some of the most peripheral segmental and
subsegmental pulmonary arteries cannot be definitively characterized
due to patient breathing motion artifact, however, there is no
pulmonary embolism identified within the main, lobar or central
segmental pulmonary arteries bilaterally.

Cardiomegaly. No pericardial effusion. Coronary artery
calcifications. Scattered aortic atherosclerosis. No thoracic aortic
aneurysm or evidence of aortic dissection.

Mediastinum/Nodes: Large hiatal hernia. No mass or enlarged lymph
nodes seen within the mediastinum or perihilar regions. Trachea and
central bronchi are unremarkable.

Lungs/Pleura: Lungs are not significantly changed in appearance
compared to the previous high-resolution chest CT of 11/28/2017,
compatible with the previous description of chronic interstitial
lung disease (NSIP), perhaps slightly lower lung volumes
accentuating the bibasilar markings with associated bibasilar
atelectasis. No evidence of pneumonia. No pleural effusion or
pneumothorax.

Upper Abdomen: No acute findings.

Musculoskeletal: Mild degenerative spurring within the thoracic
spine. No acute or suspicious osseous finding.

Review of the MIP images confirms the above findings.
IMPRESSION: 1. No acute findings. No pulmonary embolism seen. No evidence of
pneumonia or pulmonary edema.
2. Continued evidence of chronic interstitial lung disease (NSIP),
not significantly changed compared to the previous chest CT given
the slightly lower lung volumes.
3. Large hiatal hernia, stable.
4. Cardiomegaly.
5. Coronary artery calcifications.

Aortic Atherosclerosis ([id number]).

## 2019-11-15 ENCOUNTER — Ambulatory Visit: Payer: Medicare PPO | Admitting: Cardiology

## 2019-11-15 ENCOUNTER — Other Ambulatory Visit: Payer: Self-pay

## 2019-11-15 ENCOUNTER — Encounter: Payer: Self-pay | Admitting: Cardiology

## 2019-11-15 VITALS — BP 130/78 | HR 95 | Ht 62.5 in | Wt 124.4 lb

## 2019-11-15 DIAGNOSIS — R0602 Shortness of breath: Secondary | ICD-10-CM | POA: Diagnosis not present

## 2019-11-15 DIAGNOSIS — J84112 Idiopathic pulmonary fibrosis: Secondary | ICD-10-CM

## 2019-11-15 DIAGNOSIS — Z01812 Encounter for preprocedural laboratory examination: Secondary | ICD-10-CM

## 2019-11-15 DIAGNOSIS — Z8673 Personal history of transient ischemic attack (TIA), and cerebral infarction without residual deficits: Secondary | ICD-10-CM

## 2019-11-15 DIAGNOSIS — I272 Pulmonary hypertension, unspecified: Secondary | ICD-10-CM | POA: Diagnosis not present

## 2019-11-15 DIAGNOSIS — R002 Palpitations: Secondary | ICD-10-CM

## 2019-11-15 DIAGNOSIS — I10 Essential (primary) hypertension: Secondary | ICD-10-CM

## 2019-11-15 NOTE — Progress Notes (Signed)
Cardiology Office Note:    Date:  11/15/2019   ID:  Tina Calderon, DOB 08/27/1940, MRN [id number]  PCP:  Clark, Katherine K, NP  Cardiologist:  Jrue Yambao, MD  Referring MD: Clark, Katherine K, NP   CC: Follow up  History of Present Illness:    Tina Calderon is a 78 y.o. female with a hx of pulmonary fibrosis who is seen in follow up for evaluation and management of tachycardia, fatigue, exertional intolerance.  Summary from initial visit 01/22/18: She presented to [facility] ER on 01/12/18 with palpitations and shortness of breath. At pulmonary rehab that morning, her heart rate was noted to be 120 bpm (patient states this was at rest). In the ER, her ECG showed sinus tachycardia at 104 bpm, CT was negative for PE, negative troponin. She feels exhausted with basic everyday activities. For instance, she cooks breakfast every morning for herself and her husband. By the time she is done, she has no appetite. Often she has to lie down to rest for a time after. She is supposed to do about 50 minutes of near continuous cardiovascular activity at pulmonary rehab, and she is completely exhausted by the end of this. She endorses good food and fluid intake despite her fatigue. No infectious symptoms or recent illnesses.  Today: Having to depend more and more on her oxygen, as her shortness of breath has been progressive. No syncope.   No chest pain. Nuclear stress test 04/01/2018 without evidence of ischemia. Has had both Covid shots.  Reviewed request from Dr. Ramaswamy for a right heart catheterization to determine if she is a candidate for a therapy for her pulmonary fibrosis. We discussed right and left heart cath today. She is tearful when we talk about risk, but she does want to do anything she can to try to help her lungs. After shared decision making, we will proceed with right heart cath. Without ischemic symptoms and with recent normal stress test, will not pursue coronary  angiography.  No LE edema. Heart rate spikes with activity. Hears a noise like a bump or rattle in her chest when she is really tired. No pain or discomfort with this  Past Medical History:  Diagnosis Date  . Anginal pain (HCC)   . Cancer (HCC)   . CHF (congestive heart failure) (HCC) 01/22/2012  . Chronic fatigue 07/15/2014  . CVA (cerebral vascular accident) (HCC) 04/18/2018  . Family history of pulmonary fibrosis   . GERD (gastroesophageal reflux disease) 05/12/2017  . Hypertension   . Hypothyroidism   . ILD (interstitial lung disease) (HCC) 02/04/2012  . IPF (idiopathic pulmonary fibrosis) (HCC) 2013  . Mild intermittent asthma 01/29/2016  . Pelvic relaxation   . Prolapse of bladder   . Shortness of breath   . Thyroid disease     Past Surgical History:  Procedure Laterality Date  . ANTERIOR AND POSTERIOR REPAIR  12/05/2011   Procedure: ANTERIOR (CYSTOCELE) AND POSTERIOR REPAIR (RECTOCELE);  Surgeon: Angela Y Roberts, MD;  Location: [facility];  Service: Gynecology;  Laterality: N/A;  with cysto  . Bladder tact  15 years ago  . COLONOSCOPY WITH PROPOFOL N/A 04/20/2019   Procedure: COLONOSCOPY WITH PROPOFOL;  Surgeon: Anna, Kiran, MD;  Location: [facility]COPY;  Service: Gastroenterology;  Laterality: N/A;  . LEFT HEART CATHETERIZATION WITH CORONARY ANGIOGRAM N/A 01/22/2012   Procedure: LEFT HEART CATHETERIZATION WITH CORONARY ANGIOGRAM;  Surgeon: James Hochrein, MD;  Location: [facility];  Service: Cardiovascular;  Laterality: N/A;  . NECK   SURGERY  2010  . SHOULDER SURGERY    . thyroid disease    . THYROID LOBECTOMY Left 07/17/2018   Procedure: LEFT THYROID LOBECTOMY;  Surgeon: Gerkin, Todd, MD;  Location: [facility];  Service: General;  Laterality: Left;  . TONSILLECTOMY    . VAGINAL HYSTERECTOMY  15 years ago    Current Medications: Current Outpatient Medications on File Prior to Visit  Medication Sig  . acetaminophen (TYLENOL) 500 MG tablet Take 1,000 mg by mouth every 6 (six) hours  as needed for mild pain or headache.   . amLODipine (NORVASC) 5 MG tablet TAKE 1 TABLET BY MOUTH EVERY DAY FOR BLOOD PRESSURE  . atorvastatin (LIPITOR) 40 MG tablet TAKE 1 TABLET (40 MG TOTAL) BY MOUTH AT BEDTIME. FOR CHOLESTEROL.  . CALCIUM CARBONATE-VITAMIN D PO Take 1 tablet by mouth daily.   . clopidogrel (PLAVIX) 75 MG tablet TAKE 1 TABLET (75 MG TOTAL) BY MOUTH DAILY. FOR STROKE PREVENTION.  . CRANBERRY PO Take 1 capsule by mouth every Monday, Wednesday, and Friday.   . ferrous sulfate 325 (65 FE) MG tablet Take 325 mg by mouth daily.  . levothyroxine (SYNTHROID) 100 MCG tablet TAKE 1 TABLET BY MOUTH EVERY MORNING ON AN EMPTY STOMACH. NO FOOD OR OTHER MEDICATIONS FOR 30 MIN  . loratadine (CLARITIN) 10 MG tablet Take 10 mg by mouth daily.   . metoprolol succinate (TOPROL-XL) 25 MG 24 hr tablet Take 1 tablet (25 mg total) by mouth daily.  . Multiple Vitamins-Minerals (CENTRUM SILVER PO) Take 1 tablet by mouth daily.  . OFEV 150 MG CAPS 150 mg. As Directed  . omeprazole (PRILOSEC) 20 MG capsule TAKE 1 CAPSULE BY MOUTH EVERY DAY   No current facility-administered medications on file prior to visit.     Allergies:   Dilaudid [hydromorphone] and Crestor [rosuvastatin calcium]   Social History   Tobacco Use  . Smoking status: Never Smoker  . Smokeless tobacco: Never Used  Substance Use Topics  . Alcohol use: No  . Drug use: No    Family History: The patient's family history includes Aneurysm in her father; COPD in her son; Coronary artery disease in her mother; Dementia in her maternal grandmother and mother; Heart attack (age of onset: 67) in her maternal grandfather; Hypertension in her mother; Liver disease in her brother; Pulmonary fibrosis in her brother and sister; Sleep apnea in an other family member; Stroke in her maternal grandmother, mother, and another family member; Thyroid disease in her mother.  ROS:   ROS as per HPI, otherwise unremarkable  EKGs/Labs/Other Studies  Reviewed:    The following studies were reviewed today: MPI 04/01/18  Nuclear stress EF: 68%.  Blood pressure demonstrated a normal response to exercise.  There was no ST segment deviation noted during stress.  The study is normal.  This is a low risk study.  The left ventricular ejection fraction is hyperdynamic (>65%).  Echo 04/19/18 - Left ventricle: The cavity size was normal. Wall thickness was   increased in a pattern of mild LVH. Systolic function was   vigorous. The estimated ejection fraction was in the range of 65%   to 70%. Wall motion was normal; there were no regional wall   motion abnormalities. Doppler parameters are consistent with   abnormal left ventricular relaxation (grade 1 diastolic   dysfunction). Doppler parameters are consistent with   indeterminate ventricular filling pressure. - Aortic valve: Mildly calcified annulus. Trileaflet. - Mitral valve: Mildly calcified annulus. - Left atrium: The atrium   was mildly dilated. - Atrial septum: No defect or patent foramen ovale was identified. - Tricuspid valve: There was moderate regurgitation. - Pulmonary arteries: PA peak pressure: 63 mm Hg (S). - Pericardium, extracardiac: A trivial pericardial effusion was   identified.  Echo 01/31/18 Study Conclusions  - Left ventricle: The cavity size was normal. Wall thickness was   normal. Systolic function was normal. The estimated ejection   fraction was in the range of 60% to 65%. Wall motion was normal;   there were no regional wall motion abnormalities. Doppler   parameters are consistent with abnormal left ventricular   relaxation (grade 1 diastolic dysfunction). - Aortic valve: Mildly calcified annulus. Normal thickness   leaflets. Valve area (VTI): 1.83 cm^2. Valve area (Vmax): 2.06   cm^2. Valve area (Vmean): 1.74 cm^2. - Mitral valve: Mildly calcified annulus. Normal thickness leaflets   . - Left atrium: The atrium was severely dilated. - Right  ventricle: The cavity size was mildly dilated. - Technically adequate study.  Elevated RA pressures, RV-RA gradient 47 mmHg.   Monitor 01/29/18 Ventricular tachycardia-nonsustained-up to 5 beats Atrial tachycardia-37 episodes    Symptoms of fluttering sinus rhythm;                              rare PAC                             Isolated PVC Symptoms of shortness of breath PACs/PVCs  Long term monitor reviewed. All of her patient triggered events are sinus rhythm, occasionally tachycardia, rare isolated ectopy. She had one episode of 5 beats of NSVT without symptoms, and she had one brief episode of 3 beats of SVT. Rest are isolated atrial ectopy and rare PVCs.  CT angio chest 01/12/18 IMPRESSION: 1. No acute findings. No pulmonary embolism seen. No evidence of pneumonia or pulmonary edema. 2. 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 3. Large hiatal hernia, stable. 4. Cardiomegaly. 5. Coronary artery calcifications.  EKG:  EKG personally reviewed,  ECG from today is NSR, with PACs and low anteroseptal forces  Recent Labs: 12/03/2018: TSH 0.79 12/31/2018: BUN 18; Creatinine, Ser 0.92; Potassium 4.6; Sodium 138 03/01/2019: Hemoglobin 12.0; Platelets 192 10/15/2019: ALT 27  Recent Lipid Panel    Component Value Date/Time   CHOL 137 12/31/2018 1412   TRIG 162.0 (H) 12/31/2018 1412   HDL 51.20 12/31/2018 1412   CHOLHDL 3 12/31/2018 1412   VLDL 32.4 12/31/2018 1412   LDLCALC 54 12/31/2018 1412    Physical Exam:    VS:  BP 130/78   Pulse 95   Ht 5' 2.5" (1.588 m)   Wt 124 lb 6.4 oz (56.4 kg)   SpO2 100%   BMI 22.39 kg/m     Wt Readings from Last 3 Encounters:  11/15/19 124 lb 6.4 oz (56.4 kg)  10/15/19 122 lb 6.4 oz (55.5 kg)  07/16/19 123 lb 3.2 oz (55.9 kg)    GEN: frail elderly woman, Tina Calderon in place and O2 concentrator in use. HEENT: Normal, moist  mucous membranes NECK: No JVD visible at 90 degrees CARDIAC: regular rhythm, normal S1 and S2, no rubs or gallops. No murmur. VASCULAR: Radial and DP pulses 2+ bilaterally. No carotid bruits RESPIRATORY:  velcro crackles in lungs bilaterally ABDOMEN: Soft, non-tender, non-distended MUSCULOSKELETAL:    Ambulates independently SKIN: Warm and dry, no edema NEUROLOGIC:  Alert and oriented x 3. No focal neuro deficits noted. PSYCHIATRIC:  Normal affect   ASSESSMENT:    1. Shortness of breath   2. Pre-procedure lab exam   3. Pulmonary hypertension, unspecified (HCC)   4. IPF (idiopathic pulmonary fibrosis) (HCC)   5. Heart palpitations   6. Essential hypertension   7. History of CVA (cerebrovascular accident)    PLAN:    Shortness of breath: likely due to her IPF -Dr. Ramaswamy requesting [facility] to evaluate whether she is a candidate for change in therapy. We discussed at length today. Suspect given the severity of her lung disease that she may have a component of pulmonary hypertension. If PAP elevated, would use nitric oxide to test response (though she is already on amlodipine) -Risks and benefits of cardiac catheterization have been discussed with the patient.  These include bleeding, infection, kidney damage, stroke, heart attack, death.  The patient understands these risks and is willing to proceed. -CBC, BMET today prior to cath -see above; discussed coronary angiography, but without ischemic symptoms and with recent normal lexiscan nuclear stress test, will not do coronary angio -follows closely with Dr. Ramaswamy  palpitations/tachycardia:  -well controlled on metoprolol without worsening her breathing -stable symptoms, though exertion does elevated her heart rate rapidly  Hypertension: -at goal today -continue amlodipine, metoprolol.  Secondary prevention (history of CVA): -recommend heart healthy/Mediterranean diet, with whole grains, fruits, vegetable, fish, lean meats, nuts,  and olive oil. Limit salt. -recommend moderate walking, 3-5 times/week for 30-50 minutes each session. Aim for at least 150 minutes.week. Goal should be pace of 3 miles/hours, or walking 1.5 miles in 30 minutes -recommend avoidance of tobacco products. Avoid excess alcohol. -doing well on clopidogrel and atorvastatin  Plan for follow up: 6 mos or sooner PRN  Tina Novick, MD, PhD [place]  [facility]   Medication Adjustments/Labs and Tests Ordered: Current medicines are reviewed at length with the patient today.  Concerns regarding medicines are outlined above.  Orders Placed This Encounter  Procedures  . CBC  . Basic metabolic panel  . EKG 12-Lead   No orders of the defined types were placed in this encounter.   Patient Instructions  Medication Instructions:  Your Physician recommend you continue on your current medication as directed.    *If you need a refill on your cardiac medications before your next appointment, please call your pharmacy*   Lab Work: Your physician recommends that you return for lab work today (CBC, BMP)  If you have labs (blood work) drawn today and your tests are completely normal, you will receive your results only by: . MyChart Message (if you have MyChart) OR . A paper copy in the mail If you have any lab test that is abnormal or we need to change your treatment, we will call you to review the results.   Testing/Procedures: Your physician has requested that you have a cardiac catheterization. Cardiac catheterization is used to diagnose and/or treat various heart conditions. Doctors may recommend this procedure for a number of different reasons. The most common reason is to evaluate chest pain. Chest pain can be a symptom of coronary artery disease (CAD), and cardiac catheterization can show whether plaque is narrowing or blocking your heart's arteries. This procedure is also used to evaluate the valves, as well as measure the blood  flow and oxygen levels in different parts of your heart. For further information please visit www.cardiosmart.org. Please   follow instruction sheet, as given. [facility]   Follow-Up: At [facility], you and your health needs are our priority.  As part of our continuing mission to provide you with exceptional heart care, we have created designated Provider Care Teams.  These Care Teams include your primary Cardiologist (physician) and Advanced Practice Providers (APPs -  Physician Assistants and Nurse Practitioners) who all work together to provide you with the care you need, when you need it.  We recommend signing up for the patient portal called "MyChart".  Sign up information is provided on this After Visit Summary.  MyChart is used to connect with patients for Virtual Visits (Telemedicine).  Patients are able to view lab/test results, encounter notes, upcoming appointments, etc.  Non-urgent messages can be sent to your provider as well.   To learn more about what you can do with MyChart, go to [email or web address].    Your next appointment:   6 month(s)  The format for your next appointment:   In Person  Provider:   Jodiann Ognibene, MD      [facility] [address] [place] [address] Dept: [phone] Loc: [phone]  Tina Calderon  11/15/2019  You are scheduled for a Cardiac Catheterization on Friday, June 11 with Dr. Thomas Kelly.  1. Please arrive at the [facility] (Main Entrance A) at  Hospital: [address] [place],  [address] at 10:00 AM (This time is two hours before your procedure to ensure your preparation). Free valet parking service is available.   Special note: Every effort is made to have your procedure done on time. Please understand that emergencies sometimes delay scheduled procedures.  2. Diet: Do not eat solid foods after  midnight.  The patient may have clear liquids until 5am upon the day of the procedure.  3. Labs: You will need to have blood drawn today (CBC, BMP)  4. Medication instructions in preparation for your procedure:   Contrast Allergy: No   On the morning of your procedure, take your Plavix/Clopidogrel and any morning medicines NOT listed above.  You may use sips of water.  5. Plan for one night stay--bring personal belongings. 6. Bring a current list of your medications and current insurance cards. 7. You MUST have a responsible person to drive you home. 8. Someone MUST be with you the first 24 hours after you arrive home or your discharge will be delayed. 9. Please wear clothes that are easy to get on and off and wear slip-on shoes.  Thank you for allowing us to care for you!   -- [place] [facility]    Signed, Katerina Zurn, MD PhD 11/15/2019 12:55 PM    [facility] 

## 2019-11-15 NOTE — Patient Instructions (Addendum)
Medication Instructions:  Your Physician recommend you continue on your current medication as directed.    *If you need a refill on your cardiac medications before your next appointment, please call your pharmacy*   Lab Work: Your physician recommends that you return for lab work today (CBC, BMP)  If you have labs (blood work) drawn today and your tests are completely normal, you will receive your results only by: [name] MyChart Message (if you have MyChart) OR . A paper copy in the mail If you have any lab test that is abnormal or we need to change your treatment, we will call you to review the results.   Testing/Procedures: Your physician has requested that you have a cardiac catheterization. Cardiac catheterization is used to diagnose and/or treat various heart conditions. Doctors may recommend this procedure for a number of different reasons. The most common reason is to evaluate chest pain. Chest pain can be a symptom of coronary artery disease (CAD), and cardiac catheterization can show whether plaque is narrowing or blocking your heart's arteries. This procedure is also used to evaluate the valves, as well as measure the blood flow and oxygen levels in different parts of your heart. For further information please visit HugeFiesta.tn. Please follow instruction sheet, as given. [facility]   Follow-Up: At [facility], you and your health needs are our priority.  As part of our continuing mission to provide you with exceptional heart care, we have created designated Provider Care Teams.  These Care Teams include your primary Cardiologist (physician) and Advanced Practice Providers (APPs -  Physician Assistants and Nurse Practitioners) who all work together to provide you with the care you need, when you need it.  We recommend signing up for the patient portal called "MyChart".  Sign up information is provided on this After Visit Summary.  MyChart is used to connect with  patients for Virtual Visits (Telemedicine).  Patients are able to view lab/test results, encounter notes, upcoming appointments, etc.  Non-urgent messages can be sent to your provider as well.   To learn more about what you can do with MyChart, go to [email or web address].    Your next appointment:   6 month(s)  The format for your next appointment:   In Person  Provider:   Buford Dresser, MD      [address] [place] [address] Dept: [phone] Loc: [place]  11/15/2019  You are scheduled for a Cardiac Catheterization on Friday, June 11 with Dr. Shelva Majestic.  1. Please arrive at the [facility] (Main Entrance A) at [facility]: [address] [place], [place] [address] at 10:00 AM (This time is two hours before your procedure to ensure your preparation). Free valet parking service is available.   Special note: Every effort is made to have your procedure done on time. Please understand that emergencies sometimes delay scheduled procedures.  2. Diet: Do not eat solid foods after midnight.  The patient may have clear liquids until 5am upon the day of the procedure.  3. Labs: You will need to have blood drawn today (CBC, BMP)  4. Medication instructions in preparation for your procedure:   Contrast Allergy: No   On the morning of your procedure, take your Plavix/Clopidogrel and any morning medicines NOT listed above.  You may use sips of water.  5. Plan for one night stay--bring personal belongings. 6. Bring a current list  of your medications and current insurance cards. 7. You MUST have a responsible person to drive you home. 8. Someone MUST be with you the first 24 hours after you arrive home or your discharge will be delayed. 9. Please wear clothes that are easy to get on and off and wear slip-on shoes.  Thank you for allowing  [place] to care for you!   -- [place] [facility]

## 2019-11-15 NOTE — H&P (View-Only) (Signed)
Cardiology Office Note:    Date:  11/15/2019   ID:  Tina Calderon, DOB May 25, 1941, MRN [id number]  PCP:  [name], NP  Cardiologist:  Buford Dresser, MD  Referring MD: [name], NP   CC: Follow up  History of Present Illness:    Tina Calderon is a 79 y.o. female with a hx of pulmonary fibrosis who is seen in follow up for evaluation and management of tachycardia, fatigue, exertional intolerance.  Summary from initial visit 01/22/18: She presented to [facility] ER on 01/12/18 with palpitations and shortness of breath. At pulmonary rehab that morning, her heart rate was noted to be 120 bpm (patient states this was at rest). In the ER, her ECG showed sinus tachycardia at 104 bpm, CT was negative for PE, negative troponin. She feels exhausted with basic everyday activities. For instance, she cooks breakfast every morning for herself and her husband. By the time she is done, she has no appetite. Often she has to lie down to rest for a time after. She is supposed to do about 50 minutes of near continuous cardiovascular activity at pulmonary rehab, and she is completely exhausted by the end of this. She endorses good food and fluid intake despite her fatigue. No infectious symptoms or recent illnesses.  Today: Having to depend more and more on her oxygen, as her shortness of breath has been progressive. No syncope.   No chest pain. Nuclear stress test 04/01/2018 without evidence of ischemia. Has had both Covid shots.  Reviewed request from Dr. [name] for a right heart catheterization to determine if she is a candidate for a therapy for her pulmonary fibrosis. We discussed right and left heart cath today. She is tearful when we talk about risk, but she does want to do anything she can to try to help her lungs. After shared decision making, we will proceed with right heart cath. Without ischemic symptoms and with recent normal stress test, will not pursue coronary  angiography.  No LE edema. Heart rate spikes with activity. Hears a noise like a bump or rattle in her chest when she is really tired. No pain or discomfort with this  Past Medical History:  Diagnosis Date  . Anginal pain ([place])   . Cancer ([place])   . CHF (congestive heart failure) ([place]) 01/22/2012  . Chronic fatigue 07/15/2014  . CVA (cerebral vascular accident) ([place]) 04/18/2018  . Family history of pulmonary fibrosis   . GERD (gastroesophageal reflux disease) 05/12/2017  . Hypertension   . Hypothyroidism   . ILD (interstitial lung disease) ([place]) 02/04/2012  . IPF (idiopathic pulmonary fibrosis) ([place]) 2013  . Mild intermittent asthma 01/29/2016  . Pelvic relaxation   . Prolapse of bladder   . Shortness of breath   . Thyroid disease     Past Surgical History:  Procedure Laterality Date  . ANTERIOR AND POSTERIOR REPAIR  12/05/2011   Procedure: ANTERIOR (CYSTOCELE) AND POSTERIOR REPAIR (RECTOCELE);  Surgeon: [name], MD;  Location: [facility];  Service: Gynecology;  Laterality: N/A;  with cysto  . Bladder tact  15 years ago  . COLONOSCOPY WITH PROPOFOL N/A 04/20/2019   Procedure: COLONOSCOPY WITH PROPOFOL;  Surgeon: [name], MD;  Location: [facility] ENDOSCOPY;  Service: Gastroenterology;  Laterality: N/A;  . LEFT HEART CATHETERIZATION WITH CORONARY ANGIOGRAM N/A 01/22/2012   Procedure: LEFT HEART CATHETERIZATION WITH CORONARY ANGIOGRAM;  Surgeon: [name], MD;  Location: [facility] CATH LAB;  Service: Cardiovascular;  Laterality: N/A;  . NECK  SURGERY  2010  . SHOULDER SURGERY    . thyroid disease    . THYROID LOBECTOMY Left 07/17/2018   Procedure: LEFT THYROID LOBECTOMY;  Surgeon: [name], MD;  Location: [facility];  Service: General;  Laterality: Left;  . TONSILLECTOMY    . VAGINAL HYSTERECTOMY  15 years ago    Current Medications: Current Outpatient Medications on File Prior to Visit  Medication Sig  . acetaminophen (TYLENOL) 500 MG tablet Take 1,000 mg by mouth every 6 (six) hours  as needed for mild pain or headache.   [name] amLODipine (NORVASC) 5 MG tablet TAKE 1 TABLET BY MOUTH EVERY DAY FOR BLOOD PRESSURE  . atorvastatin (LIPITOR) 40 MG tablet TAKE 1 TABLET (40 MG TOTAL) BY MOUTH AT BEDTIME. FOR CHOLESTEROL.  [name] CALCIUM CARBONATE-VITAMIN D PO Take 1 tablet by mouth daily.   . clopidogrel (PLAVIX) 75 MG tablet TAKE 1 TABLET (75 MG TOTAL) BY MOUTH DAILY. FOR STROKE PREVENTION.  [name] CRANBERRY PO Take 1 capsule by mouth every Monday, Wednesday, and Friday.   . ferrous sulfate 325 (65 FE) MG tablet Take 325 mg by mouth daily.  [name] levothyroxine (SYNTHROID) 100 MCG tablet TAKE 1 TABLET BY MOUTH EVERY MORNING ON AN EMPTY STOMACH. NO FOOD OR OTHER MEDICATIONS FOR 30 MIN  . loratadine (CLARITIN) 10 MG tablet Take 10 mg by mouth daily.   . metoprolol succinate (TOPROL-XL) 25 MG 24 hr tablet Take 1 tablet (25 mg total) by mouth daily.  . Multiple Vitamins-Minerals (CENTRUM SILVER PO) Take 1 tablet by mouth daily.  [name] OFEV 150 MG CAPS 150 mg. As Directed  . omeprazole (PRILOSEC) 20 MG capsule TAKE 1 CAPSULE BY MOUTH EVERY DAY   No current facility-administered medications on file prior to visit.     Allergies:   Dilaudid [hydromorphone] and Crestor [rosuvastatin calcium]   Social History   Tobacco Use  . Smoking status: Never Smoker  . Smokeless tobacco: Never Used  Substance Use Topics  . Alcohol use: No  . Drug use: No    Family History: The patient's family history includes Aneurysm in her father; COPD in her son; Coronary artery disease in her mother; Dementia in her maternal grandmother and mother; Heart attack (age of onset: 85) in her maternal grandfather; Hypertension in her mother; Liver disease in her brother; Pulmonary fibrosis in her brother and sister; Sleep apnea in an other family member; Stroke in her maternal grandmother, mother, and another family member; Thyroid disease in her mother.  ROS:   ROS as per HPI, otherwise unremarkable  EKGs/Labs/Other Studies  Reviewed:    The following studies were reviewed today: MPI 2018/04/12  Nuclear stress EF: 68%.  Blood pressure demonstrated a normal response to exercise.  There was no ST segment deviation noted during stress.  The study is normal.  This is a low risk study.  The left ventricular ejection fraction is hyperdynamic (>65%).  Echo 04/19/18 - Left ventricle: The cavity size was normal. Wall thickness was   increased in a pattern of mild LVH. Systolic function was   vigorous. The estimated ejection fraction was in the range of 65%   to 70%. Wall motion was normal; there were no regional wall   motion abnormalities. Doppler parameters are consistent with   abnormal left ventricular relaxation (grade 1 diastolic   dysfunction). Doppler parameters are consistent with   indeterminate ventricular filling pressure. - Aortic valve: Mildly calcified annulus. Trileaflet. - Mitral valve: Mildly calcified annulus. - Left atrium: The atrium  was mildly dilated. - Atrial septum: No defect or patent foramen ovale was identified. - Tricuspid valve: There was moderate regurgitation. - Pulmonary arteries: PA peak pressure: 63 mm Hg (S). - Pericardium, extracardiac: A trivial pericardial effusion was   identified.  Echo 01/31/18 Study Conclusions  - Left ventricle: The cavity size was normal. Wall thickness was   normal. Systolic function was normal. The estimated ejection   fraction was in the range of 60% to 65%. Wall motion was normal;   there were no regional wall motion abnormalities. Doppler   parameters are consistent with abnormal left ventricular   relaxation (grade 1 diastolic dysfunction). - Aortic valve: Mildly calcified annulus. Normal thickness   leaflets. Valve area (VTI): 1.83 cm^2. Valve area (Vmax): 2.06   cm^2. Valve area (Vmean): 1.74 cm^2. - Mitral valve: Mildly calcified annulus. Normal thickness leaflets   . - Left atrium: The atrium was severely dilated. - Right  ventricle: The cavity size was mildly dilated. - Technically adequate study.  Elevated RA pressures, RV-RA gradient 47 mmHg.   Monitor 01/29/18 Ventricular tachycardia-nonsustained-up to 5 beats Atrial tachycardia-37 episodes    Symptoms of fluttering sinus rhythm;                              rare PAC                             Isolated PVC Symptoms of shortness of breath PACs/PVCs  Long term monitor reviewed. All of her patient triggered events are sinus rhythm, occasionally tachycardia, rare isolated ectopy. She had one episode of 5 beats of NSVT without symptoms, and she had one brief episode of 3 beats of SVT. Rest are isolated atrial ectopy and rare PVCs.  CT angio chest 01/12/18 IMPRESSION: 1. No acute findings. No pulmonary embolism seen. No evidence of pneumonia or pulmonary edema. 2. 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 3. Large hiatal hernia, stable. 4. Cardiomegaly. 5. Coronary artery calcifications.  EKG:  EKG personally reviewed,  ECG from today is NSR, with PACs and low anteroseptal forces  Recent Labs: 12/03/2018: TSH 0.79 12/31/2018: BUN 18; Creatinine, Ser 0.92; Potassium 4.6; Sodium 138 03/01/2019: Hemoglobin 12.0; Platelets 192 10/15/2019: ALT 27  Recent Lipid Panel    Component Value Date/Time   CHOL 137 12/31/2018 1412   TRIG 162.0 (H) 12/31/2018 1412   HDL 51.20 12/31/2018 1412   CHOLHDL 3 12/31/2018 1412   VLDL 32.4 12/31/2018 1412   LDLCALC 54 12/31/2018 1412    Physical Exam:    VS:  BP 130/78   Pulse 95   Ht 5' 2.5" (1.588 m)   Wt 124 lb 6.4 oz (56.4 kg)   SpO2 100%   BMI 22.39 kg/m     Wt Readings from Last 3 Encounters:  11/15/19 124 lb 6.4 oz (56.4 kg)  10/15/19 122 lb 6.4 oz (55.5 kg)  07/16/19 123 lb 3.2 oz (55.9 kg)    GEN: frail elderly woman, [place] in place and O2 concentrator in use. HEENT: Normal, moist  mucous membranes NECK: No JVD visible at 90 degrees CARDIAC: regular rhythm, normal S1 and S2, no rubs or gallops. No murmur. VASCULAR: Radial and DP pulses 2+ bilaterally. No carotid bruits RESPIRATORY:  velcro crackles in lungs bilaterally ABDOMEN: Soft, non-tender, non-distended MUSCULOSKELETAL:  Ambulates independently SKIN: Warm and dry, no edema NEUROLOGIC:  Alert and oriented x 3. No focal neuro deficits noted. PSYCHIATRIC:  Normal affect   ASSESSMENT:    1. Shortness of breath   2. Pre-procedure lab exam   3. Pulmonary hypertension, unspecified ([place])   4. IPF (idiopathic pulmonary fibrosis) ([place])   5. Heart palpitations   6. Essential hypertension   7. History of CVA (cerebrovascular accident)    PLAN:    Shortness of breath: likely due to her IPF -Dr. [name] requesting [facility] to evaluate whether she is a candidate for change in therapy. We discussed at length today. Suspect given the severity of her lung disease that she may have a component of pulmonary hypertension. If PAP elevated, would use nitric oxide to test response (though she is already on amlodipine) -Risks and benefits of cardiac catheterization have been discussed with the patient.  These include bleeding, infection, kidney damage, stroke, heart attack, death.  The patient understands these risks and is willing to proceed. -CBC, BMET today prior to cath -see above; discussed coronary angiography, but without ischemic symptoms and with recent normal lexiscan nuclear stress test, will not do coronary angio -follows closely with Dr. [name]  palpitations/tachycardia:  -well controlled on metoprolol without worsening her breathing -stable symptoms, though exertion does elevated her heart rate rapidly  Hypertension: -at goal today -continue amlodipine, metoprolol.  Secondary prevention (history of CVA): -recommend heart healthy/Mediterranean diet, with whole grains, fruits, vegetable, fish, lean meats, nuts,  and olive oil. Limit salt. -recommend moderate walking, 3-5 times/week for 30-50 minutes each session. Aim for at least 150 minutes.week. Goal should be pace of 3 miles/hours, or walking 1.5 miles in 30 minutes -recommend avoidance of tobacco products. Avoid excess alcohol. -doing well on clopidogrel and atorvastatin  Plan for follow up: 6 mos or sooner PRN  Buford Dresser, MD, PhD [place]  [facility]   Medication Adjustments/Labs and Tests Ordered: Current medicines are reviewed at length with the patient today.  Concerns regarding medicines are outlined above.  Orders Placed This Encounter  Procedures  . CBC  . Basic metabolic panel  . EKG 12-Lead   No orders of the defined types were placed in this encounter.   Patient Instructions  Medication Instructions:  Your Physician recommend you continue on your current medication as directed.    *If you need a refill on your cardiac medications before your next appointment, please call your pharmacy*   Lab Work: Your physician recommends that you return for lab work today (CBC, BMP)  If you have labs (blood work) drawn today and your tests are completely normal, you will receive your results only by: [name] MyChart Message (if you have MyChart) OR . A paper copy in the mail If you have any lab test that is abnormal or we need to change your treatment, we will call you to review the results.   Testing/Procedures: Your physician has requested that you have a cardiac catheterization. Cardiac catheterization is used to diagnose and/or treat various heart conditions. Doctors may recommend this procedure for a number of different reasons. The most common reason is to evaluate chest pain. Chest pain can be a symptom of coronary artery disease (CAD), and cardiac catheterization can show whether plaque is narrowing or blocking your heart's arteries. This procedure is also used to evaluate the valves, as well as measure the blood  flow and oxygen levels in different parts of your heart. For further information please visit HugeFiesta.tn. Please  follow instruction sheet, as given. [facility]   Follow-Up: At [facility], you and your health needs are our priority.  As part of our continuing mission to provide you with exceptional heart care, we have created designated Provider Care Teams.  These Care Teams include your primary Cardiologist (physician) and Advanced Practice Providers (APPs -  Physician Assistants and Nurse Practitioners) who all work together to provide you with the care you need, when you need it.  We recommend signing up for the patient portal called "MyChart".  Sign up information is provided on this After Visit Summary.  MyChart is used to connect with patients for Virtual Visits (Telemedicine).  Patients are able to view lab/test results, encounter notes, upcoming appointments, etc.  Non-urgent messages can be sent to your provider as well.   To learn more about what you can do with MyChart, go to [email or web address].    Your next appointment:   6 month(s)  The format for your next appointment:   In Person  Provider:   Buford Dresser, MD      [place] [address] Dept: [phone] Loc: [place]  11/15/2019  You are scheduled for a Cardiac Catheterization on Friday, June 11 with Dr. Shelva Majestic.  1. Please arrive at the [facility] (Main Entrance A) at [facility]: [address] [place], [place] [address] at 10:00 AM (This time is two hours before your procedure to ensure your preparation). Free valet parking service is available.   Special note: Every effort is made to have your procedure done on time. Please understand that emergencies sometimes delay scheduled procedures.  2. Diet: Do not eat solid foods after  midnight.  The patient may have clear liquids until 5am upon the day of the procedure.  3. Labs: You will need to have blood drawn today (CBC, BMP)  4. Medication instructions in preparation for your procedure:   Contrast Allergy: No   On the morning of your procedure, take your Plavix/Clopidogrel and any morning medicines NOT listed above.  You may use sips of water.  5. Plan for one night stay--bring personal belongings. 6. Bring a current list of your medications and current insurance cards. 7. You MUST have a responsible person to drive you home. 8. Someone MUST be with you the first 24 hours after you arrive home or your discharge will be delayed. 9. Please wear clothes that are easy to get on and off and wear slip-on shoes.  Thank you for allowing [place] to care for you!   -- [facility]    Signed, Buford Dresser, MD PhD 11/15/2019 12:55 PM    [place]

## 2019-11-15 NOTE — Telephone Encounter (Signed)
Thank you for letting us know.  Triage: Please ensure that she has a follow-up visit with me in [place] clinic in July 2021 after her pulmonary function test with end of June 2021.  This will allow me to discuss the test results

## 2019-11-15 NOTE — Telephone Encounter (Signed)
MR FYI email sent from patient: Hello Dr Chase Tina Calderon, just wanted to let you know that I am scheduled for the heart Cath On Friday 11/19/19 at [facility] at [facility]

## 2019-11-16 LAB — BASIC METABOLIC PANEL
BUN/Creatinine Ratio: 18 (ref 12–28)
BUN: 14 mg/dL (ref 8–27)
CO2: 23 mmol/L (ref 20–29)
Calcium: 8.7 mg/dL (ref 8.7–10.3)
Chloride: 104 mmol/L (ref 96–106)
Creatinine, Ser: 0.76 mg/dL (ref 0.57–1.00)
GFR calc Af Amer: 86 mL/min/{1.73_m2} (ref >59)
GFR calc non Af Amer: 75 mL/min/{1.73_m2} (ref >59)
Glucose: 89 mg/dL (ref 65–99)
Potassium: 4.3 mmol/L (ref 3.5–5.2)
Sodium: 141 mmol/L (ref 134–144)

## 2019-11-16 LAB — CBC
Hematocrit: 35.1 % (ref 34.0–46.6)
Hemoglobin: 11.5 g/dL (ref 11.1–15.9)
MCH: 31.1 pg (ref 26.6–33.0)
MCHC: 32.8 g/dL (ref 31.5–35.7)
MCV: 95 fL (ref 79–97)
Platelets: 160 10*3/uL (ref 150–450)
RBC: 3.7 x10E6/uL — ABNORMAL LOW (ref 3.77–5.28)
RDW: 12.8 % (ref 11.7–15.4)
WBC: 5.8 10*3/uL (ref 3.4–10.8)

## 2019-11-18 ENCOUNTER — Telehealth: Payer: Self-pay | Admitting: *Deleted

## 2019-11-18 NOTE — Telephone Encounter (Signed)
Pt contacted pre-catheterization scheduled at [facility] for: Friday November 19, 2019 12 Noon Verified arrival time and place: [facility]) at: 10 AM   No solid food after midnight prior to cath, clear liquids until 5 AM day of procedure.  AM meds can be  taken pre-cath with sips of water.   Confirmed patient has responsible adult to drive home post procedure and observe 24 hours after arriving home: yes  You are allowed ONE visitor in the waiting room during your procedure. Both you and your visitor must wear masks.      COVID-19 Pre-Screening Questions:  . In the past 7 to 10 days have you had a cough,  shortness of breath, headache, congestion, fever (100 or greater) body aches, chills, sore throat, or sudden loss of taste or sense of smell? Shortness of breath, not new . In the past 7 to 10 days have you been around anyone with known Covid 19? no    Reviewed procedure/mask/visitor instructions, COVID-19 screening questions with patient.

## 2019-11-19 ENCOUNTER — Ambulatory Visit (HOSPITAL_COMMUNITY)
Admission: RE | Admit: 2019-11-19 | Discharge: 2019-11-19 | Disposition: A | Discharge status: Home / Self Care | Payer: Medicare PPO | Attending: Cardiovascular Disease | Admitting: Cardiovascular Disease

## 2019-11-19 ENCOUNTER — Other Ambulatory Visit: Payer: Self-pay

## 2019-11-19 ENCOUNTER — Encounter (HOSPITAL_COMMUNITY)
Admission: RE | Disposition: A | Discharge status: Home / Self Care | Payer: Self-pay | Source: Home / Self Care | Attending: Cardiovascular Disease

## 2019-11-19 DIAGNOSIS — Z7902 Long term (current) use of antithrombotics/antiplatelets: Secondary | ICD-10-CM | POA: Diagnosis not present

## 2019-11-19 DIAGNOSIS — E039 Hypothyroidism, unspecified: Secondary | ICD-10-CM | POA: Diagnosis not present

## 2019-11-19 DIAGNOSIS — Z8673 Personal history of transient ischemic attack (TIA), and cerebral infarction without residual deficits: Secondary | ICD-10-CM | POA: Diagnosis not present

## 2019-11-19 DIAGNOSIS — I110 Hypertensive heart disease with heart failure: Secondary | ICD-10-CM | POA: Diagnosis not present

## 2019-11-19 DIAGNOSIS — Z79890 Hormone replacement therapy (postmenopausal): Secondary | ICD-10-CM | POA: Insufficient documentation

## 2019-11-19 DIAGNOSIS — I2721 Secondary pulmonary arterial hypertension: Secondary | ICD-10-CM | POA: Insufficient documentation

## 2019-11-19 DIAGNOSIS — I509 Heart failure, unspecified: Secondary | ICD-10-CM | POA: Insufficient documentation

## 2019-11-19 DIAGNOSIS — Z79899 Other long term (current) drug therapy: Secondary | ICD-10-CM | POA: Insufficient documentation

## 2019-11-19 DIAGNOSIS — J84112 Idiopathic pulmonary fibrosis: Secondary | ICD-10-CM | POA: Diagnosis not present

## 2019-11-19 DIAGNOSIS — K219 Gastro-esophageal reflux disease without esophagitis: Secondary | ICD-10-CM | POA: Diagnosis not present

## 2019-11-19 DIAGNOSIS — J45909 Unspecified asthma, uncomplicated: Secondary | ICD-10-CM | POA: Diagnosis not present

## 2019-11-19 HISTORY — PX: RIGHT HEART CATH: CATH118263

## 2019-11-19 LAB — POCT I-STAT EG7
Acid-Base Excess: 0 mmol/L (ref 0.0–2.0)
Acid-Base Excess: 0 mmol/L (ref 0.0–2.0)
Bicarbonate: 24.6 mmol/L (ref 20.0–28.0)
Bicarbonate: 24.8 mmol/L (ref 20.0–28.0)
Calcium, Ion: 1.23 mmol/L (ref 1.15–1.40)
Calcium, Ion: 1.24 mmol/L (ref 1.15–1.40)
HCT: 32 % — ABNORMAL LOW (ref 36.0–46.0)
HCT: 32 % — ABNORMAL LOW (ref 36.0–46.0)
Hemoglobin: 10.9 g/dL — ABNORMAL LOW (ref 12.0–15.0)
Hemoglobin: 10.9 g/dL — ABNORMAL LOW (ref 12.0–15.0)
O2 Saturation: 79 %
O2 Saturation: 81 %
Potassium: 4.1 mmol/L (ref 3.5–5.1)
Potassium: 4.2 mmol/L (ref 3.5–5.1)
Sodium: 142 mmol/L (ref 135–145)
Sodium: 142 mmol/L (ref 135–145)
TCO2: 26 mmol/L (ref 22–32)
TCO2: 26 mmol/L (ref 22–32)
pCO2, Ven: 40.8 mmHg — ABNORMAL LOW (ref 44.0–60.0)
pCO2, Ven: 41 mmHg — ABNORMAL LOW (ref 44.0–60.0)
pH, Ven: 7.386 (ref 7.250–7.430)
pH, Ven: 7.392 (ref 7.250–7.430)
pO2, Ven: 44 mmHg (ref 32.0–45.0)
pO2, Ven: 45 mmHg (ref 32.0–45.0)

## 2019-11-19 SURGERY — RIGHT HEART CATH

## 2019-11-19 MED ORDER — ACETAMINOPHEN 325 MG PO TABS: 650.0000 mg | ORAL_TABLET | ORAL | Status: DC | PRN

## 2019-11-19 MED ORDER — MIDAZOLAM HCL 2 MG/2ML IJ SOLN
INTRAMUSCULAR | Status: AC
  Filled 2019-11-19: qty 2

## 2019-11-19 MED ORDER — SODIUM CHLORIDE 0.9 % IV SOLN: 250.0000 mL | INTRAVENOUS | Status: DC | PRN

## 2019-11-19 MED ORDER — DIAZEPAM 2 MG PO TABS: 2.0000 mg | ORAL_TABLET | Freq: Four times a day (QID) | ORAL | Status: DC | PRN

## 2019-11-19 MED ORDER — LIDOCAINE HCL (PF) 1 % IJ SOLN
INTRAMUSCULAR | Status: AC
  Filled 2019-11-19: qty 30

## 2019-11-19 MED ORDER — ASPIRIN 81 MG PO CHEW
81.0000 mg | CHEWABLE_TABLET | Freq: Once | ORAL | Status: AC
  Administered 2019-11-19: 81 mg via ORAL

## 2019-11-19 MED ORDER — HEPARIN (PORCINE) IN NACL 1000-0.9 UT/500ML-% IV SOLN
INTRAVENOUS | Status: DC | PRN
  Administered 2019-11-19: 500 mL

## 2019-11-19 MED ORDER — HYDRALAZINE HCL 20 MG/ML IJ SOLN: 10.0000 mg | INTRAMUSCULAR | Status: DC | PRN

## 2019-11-19 MED ORDER — LABETALOL HCL 5 MG/ML IV SOLN: 10.0000 mg | INTRAVENOUS | Status: DC | PRN

## 2019-11-19 MED ORDER — ASPIRIN 81 MG PO CHEW
CHEWABLE_TABLET | ORAL | Status: AC
  Filled 2019-11-19: qty 1

## 2019-11-19 MED ORDER — ONDANSETRON HCL 4 MG/2ML IJ SOLN: 4.0000 mg | Freq: Four times a day (QID) | INTRAMUSCULAR | Status: DC | PRN

## 2019-11-19 MED ORDER — MIDAZOLAM HCL 2 MG/2ML IJ SOLN
INTRAMUSCULAR | Status: DC | PRN
  Administered 2019-11-19 (×2): 0.5 mg via INTRAVENOUS

## 2019-11-19 MED ORDER — SODIUM CHLORIDE 0.9% FLUSH: 3.0000 mL | Freq: Two times a day (BID) | INTRAVENOUS | Status: DC

## 2019-11-19 MED ORDER — SODIUM CHLORIDE 0.9 % IV SOLN: INTRAVENOUS | Status: DC

## 2019-11-19 MED ORDER — SODIUM CHLORIDE 0.9 % WEIGHT BASED INFUSION: 1.0000 mL/kg/h | INTRAVENOUS | Status: DC

## 2019-11-19 MED ORDER — SODIUM CHLORIDE 0.9% FLUSH: 3.0000 mL | INTRAVENOUS | Status: DC | PRN

## 2019-11-19 MED ORDER — LIDOCAINE HCL (PF) 1 % IJ SOLN
INTRAMUSCULAR | Status: DC | PRN
  Administered 2019-11-19: 12 mL
  Administered 2019-11-19: 2 mL

## 2019-11-19 MED ORDER — HEPARIN (PORCINE) IN NACL 1000-0.9 UT/500ML-% IV SOLN
INTRAVENOUS | Status: AC
  Filled 2019-11-19: qty 500

## 2019-11-19 MED ORDER — SODIUM CHLORIDE 0.9 % WEIGHT BASED INFUSION
3.0000 mL/kg/h | INTRAVENOUS | Status: AC
  Administered 2019-11-19: 3 mL/kg/h via INTRAVENOUS

## 2019-11-19 SURGICAL SUPPLY — 13 items
CATH BALLN WEDGE 5F 110CM (CATHETERS) ×1 IMPLANT
CATH SWAN GANZ 7F STRAIGHT (CATHETERS) ×1 IMPLANT
GUIDEWIRE .025 260CM (WIRE) ×1 IMPLANT
PACK CARDIAC CATHETERIZATION (CUSTOM PROCEDURE TRAY) ×1 IMPLANT
PROTECTION STATION PRESSURIZED (MISCELLANEOUS) ×2
SHEATH GLIDE SLENDER 4/5FR (SHEATH) ×1 IMPLANT
SHEATH PINNACLE 7F 10CM (SHEATH) ×1 IMPLANT
SHEATH PROBE COVER 6X72 (BAG) ×1 IMPLANT
STATION PROTECTION PRESSURIZED (MISCELLANEOUS) IMPLANT
TRANSDUCER W/STOPCOCK (MISCELLANEOUS) ×1 IMPLANT
TUBING ART PRESS 72  MALE/FEM (TUBING) ×2
TUBING ART PRESS 72 MALE/FEM (TUBING) IMPLANT
WIRE EMERALD 3MM-J .035X150CM (WIRE) ×1 IMPLANT

## 2019-11-19 NOTE — Interval H&P Note (Signed)
History and Physical Interval Note:  11/19/2019 2:46 PM  Tina Calderon  has presented today for surgery, with the diagnosis of hp.  The various methods of treatment have been discussed with the patient and family. After consideration of risks, benefits and other options for treatment, the patient has consented to  Procedure(s): RIGHT HEART CATH (N/A) as a surgical intervention.  The patient's history has been reviewed, patient examined, no change in status, stable for surgery.  I have reviewed the patient's chart and labs.  Questions were answered to the patient's satisfaction.     Shelva Majestic

## 2019-11-19 NOTE — Discharge Instructions (Signed)
Femoral Site Care This sheet gives you information about how to care for yourself after your procedure. Your health care provider may also give you more specific instructions. If you have problems or questions, contact your health care provider. What can I expect after the procedure? After the procedure, it is common to have:  Bruising that usually fades within 1-2 weeks.  Tenderness at the site. Follow these instructions at home: Wound care  Follow instructions from your health care provider about how to take care of your insertion site. Make sure you: ? Wash your hands with soap and water before you change your bandage (dressing). If soap and water are not available, use hand sanitizer. ? Change your dressing as told by your health care provider. ? Leave stitches (sutures), skin glue, or adhesive strips in place. These skin closures may need to stay in place for 2 weeks or longer. If adhesive strip edges start to loosen and curl up, you may trim the loose edges. Do not remove adhesive strips completely unless your health care provider tells you to do that.  Do not take baths, swim, or use a hot tub until your health care provider approves.  You may shower 24-48 hours after the procedure or as told by your health care provider. ? Gently wash the site with plain soap and water. ? Pat the area dry with a clean towel. ? Do not rub the site. This may cause bleeding.  Do not apply powder or lotion to the site. Keep the site clean and dry.  Check your femoral site every day for signs of infection. Check for: ? Redness, swelling, or pain. ? Fluid or blood. ? Warmth. ? Pus or a bad smell. Activity  For the first 2-3 days after your procedure, or as long as directed: ? Avoid climbing stairs as much as possible. ? Do not squat.  Do not lift anything that is heavier than 10 lb (4.5 kg), or the limit that you are told, until your health care provider says that it is safe.  Rest as  directed. ? Avoid sitting for a long time without moving. Get up to take short walks every 1-2 hours.  Do not drive for 24 hours if you were given a medicine to help you relax (sedative). General instructions  Take over-the-counter and prescription medicines only as told by your health care provider.  Keep all follow-up visits as told by your health care provider. This is important. Contact a health care provider if you have:  A fever or chills.  You have redness, swelling, or pain around your insertion site. Get help right away if:  The catheter insertion area swells very fast.  You pass out.  You suddenly start to sweat or your skin gets clammy.  The catheter insertion area is bleeding, and the bleeding does not stop when you hold steady pressure on the area.  The area near or just beyond the catheter insertion site becomes pale, cool, tingly, or numb. These symptoms may represent a serious problem that is an emergency. Do not wait to see if the symptoms will go away. Get medical help right away. Call your local emergency services (911 in the [place] Do not drive yourself to the hospital. Summary  After the procedure, it is common to have bruising that usually fades within 1-2 weeks.  Check your femoral site every day for signs of infection.  Do not lift anything that is heavier than 10 lb (4.5 kg), or the   limit that you are told, until your health care provider says that it is safe. This information is not intended to replace advice given to you by your health care provider. Make sure you discuss any questions you have with your health care provider. Document Revised: 06/09/2017 Document Reviewed: 06/09/2017 Elsevier Patient Education  2020 [employer].

## 2019-11-22 ENCOUNTER — Encounter (HOSPITAL_COMMUNITY): Payer: Self-pay | Admitting: Cardiovascular Disease

## 2019-11-28 ENCOUNTER — Encounter: Payer: Self-pay | Admitting: Emergency Medicine

## 2019-11-28 ENCOUNTER — Inpatient Hospital Stay
Admission: EM | Admit: 2019-11-28 | Discharge: 2019-12-02 | DRG: 300 | Disposition: A | Discharge status: Skilled Nursing Facility | Payer: Medicare PPO | Attending: Internal Medicine | Admitting: Internal Medicine

## 2019-11-28 ENCOUNTER — Emergency Department: Payer: Medicare PPO

## 2019-11-28 ENCOUNTER — Other Ambulatory Visit: Payer: Self-pay

## 2019-11-28 DIAGNOSIS — J4520 Mild intermittent asthma, uncomplicated: Secondary | ICD-10-CM | POA: Diagnosis present

## 2019-11-28 DIAGNOSIS — Y840 Cardiac catheterization as the cause of abnormal reaction of the patient, or of later complication, without mention of misadventure at the time of the procedure: Secondary | ICD-10-CM | POA: Diagnosis present

## 2019-11-28 DIAGNOSIS — E039 Hypothyroidism, unspecified: Secondary | ICD-10-CM | POA: Diagnosis present

## 2019-11-28 DIAGNOSIS — Z885 Allergy status to narcotic agent status: Secondary | ICD-10-CM | POA: Diagnosis not present

## 2019-11-28 DIAGNOSIS — R7303 Prediabetes: Secondary | ICD-10-CM | POA: Diagnosis present

## 2019-11-28 DIAGNOSIS — Z8249 Family history of ischemic heart disease and other diseases of the circulatory system: Secondary | ICD-10-CM | POA: Diagnosis not present

## 2019-11-28 DIAGNOSIS — Z8349 Family history of other endocrine, nutritional and metabolic diseases: Secondary | ICD-10-CM

## 2019-11-28 DIAGNOSIS — I724 Aneurysm of artery of lower extremity: Secondary | ICD-10-CM | POA: Diagnosis present

## 2019-11-28 DIAGNOSIS — R58 Hemorrhage, not elsewhere classified: Secondary | ICD-10-CM | POA: Diagnosis present

## 2019-11-28 DIAGNOSIS — Z8673 Personal history of transient ischemic attack (TIA), and cerebral infarction without residual deficits: Secondary | ICD-10-CM

## 2019-11-28 DIAGNOSIS — J849 Interstitial pulmonary disease, unspecified: Secondary | ICD-10-CM | POA: Diagnosis present

## 2019-11-28 DIAGNOSIS — Z825 Family history of asthma and other chronic lower respiratory diseases: Secondary | ICD-10-CM

## 2019-11-28 DIAGNOSIS — Z823 Family history of stroke: Secondary | ICD-10-CM

## 2019-11-28 DIAGNOSIS — K219 Gastro-esophageal reflux disease without esophagitis: Secondary | ICD-10-CM | POA: Diagnosis present

## 2019-11-28 DIAGNOSIS — Z888 Allergy status to other drugs, medicaments and biological substances status: Secondary | ICD-10-CM | POA: Diagnosis not present

## 2019-11-28 DIAGNOSIS — Z20822 Contact with and (suspected) exposure to covid-19: Secondary | ICD-10-CM | POA: Diagnosis present

## 2019-11-28 DIAGNOSIS — Z79899 Other long term (current) drug therapy: Secondary | ICD-10-CM | POA: Diagnosis not present

## 2019-11-28 DIAGNOSIS — T81718A Complication of other artery following a procedure, not elsewhere classified, initial encounter: Principal | ICD-10-CM | POA: Diagnosis present

## 2019-11-28 DIAGNOSIS — I10 Essential (primary) hypertension: Secondary | ICD-10-CM

## 2019-11-28 DIAGNOSIS — I729 Aneurysm of unspecified site: Secondary | ICD-10-CM | POA: Diagnosis present

## 2019-11-28 DIAGNOSIS — R1031 Right lower quadrant pain: Secondary | ICD-10-CM

## 2019-11-28 DIAGNOSIS — J8410 Pulmonary fibrosis, unspecified: Secondary | ICD-10-CM | POA: Diagnosis not present

## 2019-11-28 DIAGNOSIS — Z79890 Hormone replacement therapy (postmenopausal): Secondary | ICD-10-CM

## 2019-11-28 DIAGNOSIS — E785 Hyperlipidemia, unspecified: Secondary | ICD-10-CM

## 2019-11-28 DIAGNOSIS — Z7902 Long term (current) use of antithrombotics/antiplatelets: Secondary | ICD-10-CM

## 2019-11-28 LAB — SARS CORONAVIRUS 2 BY RT PCR (HOSPITAL ORDER, PERFORMED IN ~~LOC~~ HOSPITAL LAB): SARS Coronavirus 2: NEGATIVE

## 2019-11-28 MED ORDER — ONDANSETRON HCL 4 MG/2ML IJ SOLN: 4.0000 mg | Freq: Once | INTRAMUSCULAR | Status: AC

## 2019-11-28 MED ORDER — CALCIUM CARBONATE-VITAMIN D 500-200 MG-UNIT PO TABS
1.0000 | ORAL_TABLET | Freq: Every day | ORAL | Status: DC
  Administered 2019-11-30 – 2019-12-02 (×3): 1 via ORAL
  Filled 2019-11-28 (×5): qty 1

## 2019-11-28 MED ORDER — ACETAMINOPHEN 650 MG RE SUPP: 650.0000 mg | Freq: Four times a day (QID) | RECTAL | Status: DC | PRN

## 2019-11-28 MED ORDER — PANTOPRAZOLE SODIUM 40 MG PO TBEC
40.0000 mg | DELAYED_RELEASE_TABLET | Freq: Every day | ORAL | Status: DC
  Administered 2019-11-29 – 2019-12-02 (×4): 40 mg via ORAL
  Filled 2019-11-28 (×4): qty 1

## 2019-11-28 MED ORDER — METOPROLOL SUCCINATE ER 25 MG PO TB24
25.0000 mg | ORAL_TABLET | Freq: Every day | ORAL | Status: DC
  Administered 2019-11-29 – 2019-12-02 (×4): 25 mg via ORAL
  Filled 2019-11-28 (×4): qty 1

## 2019-11-28 MED ORDER — MORPHINE SULFATE (PF) 4 MG/ML IV SOLN
INTRAVENOUS | Status: AC
  Administered 2019-11-28: 4 mg via INTRAVENOUS
  Filled 2019-11-28: qty 1

## 2019-11-28 MED ORDER — FENTANYL CITRATE (PF) 100 MCG/2ML IJ SOLN
25.0000 ug | Freq: Once | INTRAMUSCULAR | Status: AC
  Administered 2019-11-28: 25 ug via INTRAVENOUS
  Filled 2019-11-28: qty 2

## 2019-11-28 MED ORDER — FERROUS SULFATE 325 (65 FE) MG PO TABS
325.0000 mg | ORAL_TABLET | Freq: Every day | ORAL | Status: DC
  Administered 2019-11-29 – 2019-12-02 (×5): 325 mg via ORAL
  Filled 2019-11-28 (×5): qty 1

## 2019-11-28 MED ORDER — ONDANSETRON HCL 4 MG PO TABS: 4.0000 mg | ORAL_TABLET | Freq: Four times a day (QID) | ORAL | Status: DC | PRN

## 2019-11-28 MED ORDER — SODIUM CHLORIDE 0.9 % IV SOLN: INTRAVENOUS | Status: DC

## 2019-11-28 MED ORDER — ACETAMINOPHEN 325 MG PO TABS
650.0000 mg | ORAL_TABLET | Freq: Four times a day (QID) | ORAL | Status: DC | PRN
  Administered 2019-11-29 – 2019-12-01 (×6): 650 mg via ORAL
  Filled 2019-11-28 (×6): qty 2

## 2019-11-28 MED ORDER — ATORVASTATIN CALCIUM 20 MG PO TABS
40.0000 mg | ORAL_TABLET | Freq: Every day | ORAL | Status: DC
  Administered 2019-11-29 – 2019-12-01 (×4): 40 mg via ORAL
  Filled 2019-11-28 (×4): qty 2

## 2019-11-28 MED ORDER — ADULT MULTIVITAMIN W/MINERALS CH
ORAL_TABLET | Freq: Every day | ORAL | Status: DC
  Administered 2019-11-30 – 2019-12-02 (×3): 1 via ORAL
  Filled 2019-11-28 (×4): qty 1

## 2019-11-28 MED ORDER — ONDANSETRON HCL 4 MG/2ML IJ SOLN: 4.0000 mg | Freq: Four times a day (QID) | INTRAMUSCULAR | Status: DC | PRN

## 2019-11-28 MED ORDER — LEVOTHYROXINE SODIUM 100 MCG PO TABS
100.0000 ug | ORAL_TABLET | Freq: Every day | ORAL | Status: DC
  Administered 2019-11-29 – 2019-12-02 (×4): 100 ug via ORAL
  Filled 2019-11-28 (×4): qty 1

## 2019-11-28 MED ORDER — NINTEDANIB ESYLATE 150 MG PO CAPS: 150.0000 mg | ORAL_CAPSULE | Freq: Two times a day (BID) | ORAL | Status: DC

## 2019-11-28 MED ORDER — CRANBERRY 250 MG PO TABS: ORAL_TABLET | ORAL | Status: DC

## 2019-11-28 MED ORDER — AMLODIPINE BESYLATE 5 MG PO TABS
5.0000 mg | ORAL_TABLET | Freq: Every day | ORAL | Status: DC
  Administered 2019-11-30 – 2019-12-02 (×3): 5 mg via ORAL
  Filled 2019-11-28 (×4): qty 1

## 2019-11-28 MED ORDER — TRAZODONE HCL 50 MG PO TABS: 25.0000 mg | ORAL_TABLET | Freq: Every evening | ORAL | Status: DC | PRN

## 2019-11-28 MED ORDER — LORATADINE 10 MG PO TABS
10.0000 mg | ORAL_TABLET | Freq: Every day | ORAL | Status: DC
  Administered 2019-11-29 – 2019-12-02 (×4): 10 mg via ORAL
  Filled 2019-11-28 (×4): qty 1

## 2019-11-28 MED ORDER — MORPHINE SULFATE (PF) 4 MG/ML IV SOLN: 4.0000 mg | Freq: Once | INTRAVENOUS | Status: AC

## 2019-11-28 MED ORDER — ONDANSETRON HCL 4 MG/2ML IJ SOLN
INTRAMUSCULAR | Status: AC
  Administered 2019-11-28: 4 mg via INTRAVENOUS
  Filled 2019-11-28: qty 2

## 2019-11-28 MED ORDER — MAGNESIUM HYDROXIDE 400 MG/5ML PO SUSP
30.0000 mL | Freq: Every day | ORAL | Status: DC | PRN
  Administered 2019-11-30: 30 mL via ORAL
  Filled 2019-11-28: qty 30

## 2019-11-28 NOTE — ED Triage Notes (Signed)
Pt states had heart cath on 6/11, states woke up this morning and had severe pain to incision site on R groin. Pt states pain worse with moving. Pt states bruising from initial cath. Pt denies increased bruising or hardness with palpation, states "very very painful".

## 2019-11-28 NOTE — ED Notes (Signed)
First Nurse Note: Pt to ED via [facility] for rt leg pain at surgical incision. Pt has bruising to the area. Per EMS no redness noted. Pain has gotten worse over the past few days. Pt does not have any other complaints at this time. VSS per EMS.

## 2019-11-28 NOTE — H&P (Signed)
[facility] NAME: Tina Calderon    MR#:  [id number]  DATE OF BIRTH:  1940-07-15  DATE OF ADMISSION:  11/28/2019  PRIMARY CARE PHYSICIAN: [name], NP   REQUESTING/REFERRING PHYSICIAN: Lavonia Drafts, MD CHIEF COMPLAINT:   Chief Complaint  Patient presents with  . Leg Pain    HISTORY OF PRESENT ILLNESS:  Tina Calderon  is a 79 y.o. Caucasian female with a known history of CHF, hypertension, hypothyroidism, interstitial lung disease who presented to the emergency room with acute onset of right thigh swelling and pain with enlarging ecchymosis after having cardiac catheterization about 11 days ago to determine if the patient is a candidate for therapy for pulmonary fibrosis.  She started having the right groin swelling and pain a couple of days ago has been getting significantly worse.  She denies any fever or chills.  No nausea or vomiting no chest pain or palpitations.  No cough or wheezing..  No other bleeding diathesis.  Upon presentation to the emergency room, blood pressure was 145/95 with otherwise normal vital signs.  Right lower extremity ultrasound revealed partially thrombosed pseudoaneurysm in the right groin.  The patient was given 25 mcg of IV fentanyl, 4 mg of IV morphine sulfate and 4 mg of IV Zofran.  She will be admitted to a medically monitored bed for further evaluation and management  PAST MEDICAL HISTORY:   Past Medical History:  Diagnosis Date  . Anginal pain ([place])   . Cancer ([place])   . CHF (congestive heart failure) ([place]) 01/22/2012  . Chronic fatigue 07/15/2014  . CVA (cerebral vascular accident) ([place]) 04/18/2018  . Family history of pulmonary fibrosis   . GERD (gastroesophageal reflux disease) 05/12/2017  . Hypertension   . Hypothyroidism   . ILD (interstitial lung disease) ([place]) 02/04/2012  . IPF (idiopathic pulmonary fibrosis) ([place]) 2013  . Mild intermittent asthma 01/29/2016  . Pelvic relaxation   . Prolapse of  bladder   . Shortness of breath   . Thyroid disease     PAST SURGICAL HISTORY:   Past Surgical History:  Procedure Laterality Date  . ANTERIOR AND POSTERIOR REPAIR  12/05/2011   Procedure: ANTERIOR (CYSTOCELE) AND POSTERIOR REPAIR (RECTOCELE);  Surgeon: [name], MD;  Location: [facility];  Service: Gynecology;  Laterality: N/A;  with cysto  . Bladder tact  15 years ago  . COLONOSCOPY WITH PROPOFOL N/A 04/20/2019   Procedure: COLONOSCOPY WITH PROPOFOL;  Surgeon: [name], MD;  Location: [facility];  Service: Gastroenterology;  Laterality: N/A;  . LEFT HEART CATHETERIZATION WITH CORONARY ANGIOGRAM N/A 01/22/2012   Procedure: LEFT HEART CATHETERIZATION WITH CORONARY ANGIOGRAM;  Surgeon: [name], MD;  Location: [facility] CATH LAB;  Service: Cardiovascular;  Laterality: N/A;  . NECK SURGERY  2010  . RIGHT HEART CATH N/A 11/19/2019   Procedure: RIGHT HEART CATH;  Surgeon: [name], MD;  Location: [facility];  Service: Cardiovascular;  Laterality: N/A;  . SHOULDER SURGERY    . thyroid disease    . THYROID LOBECTOMY Left 07/17/2018   Procedure: LEFT THYROID LOBECTOMY;  Surgeon: [name], MD;  Location: [facility];  Service: General;  Laterality: Left;  . TONSILLECTOMY    . VAGINAL HYSTERECTOMY  15 years ago    SOCIAL HISTORY:   Social History   Tobacco Use  . Smoking status: Never Smoker  . Smokeless tobacco: Never Used  Substance Use Topics  . Alcohol use: No  FAMILY HISTORY:   Family History  Problem Relation Age of Onset  . Thyroid disease Mother   . Hypertension Mother   . Coronary artery disease Mother   . Dementia Mother   . Stroke Mother   . Aneurysm Father   . Pulmonary fibrosis Sister   . Stroke Other   . Sleep apnea Other   . Liver disease Brother   . Pulmonary fibrosis Brother   . Stroke Maternal Grandmother   . Dementia Maternal Grandmother   . Heart attack Maternal Grandfather 67  . COPD Son     DRUG ALLERGIES:   Allergies   Allergen Reactions  . Crestor [Rosuvastatin Calcium] Other (See Comments)    Myalgias, muscle weakness  . Dilaudid [Hydromorphone] Nausea And Vomiting    REVIEW OF SYSTEMS:   ROS As per history of present illness. All pertinent systems were reviewed above. Constitutional,  HEENT, cardiovascular, respiratory, GI, GU, musculoskeletal, neuro, psychiatric, endocrine,  integumentary and hematologic systems were reviewed and are otherwise  negative/unremarkable except for positive findings mentioned above in the HPI.   MEDICATIONS AT HOME:   Prior to Admission medications   Medication Sig Start Date End Date Taking? Authorizing Provider  acetaminophen (TYLENOL) 500 MG tablet Take 1,000 mg by mouth every 6 (six) hours as needed for mild pain or headache.     [provider]  amLODipine (NORVASC) 5 MG tablet TAKE 1 TABLET BY MOUTH EVERY DAY FOR BLOOD PRESSURE Patient taking differently: Take 5 mg by mouth daily.  11/12/19   [name], NP  atorvastatin (LIPITOR) 40 MG tablet TAKE 1 TABLET (40 MG TOTAL) BY MOUTH AT BEDTIME. FOR CHOLESTEROL. 10/14/19   [name], NP  CALCIUM CARBONATE-VITAMIN D PO Take 1 tablet by mouth daily.     [provider]  clopidogrel (PLAVIX) 75 MG tablet TAKE 1 TABLET (75 MG TOTAL) BY MOUTH DAILY. FOR STROKE PREVENTION. 05/28/19   [name], NP  CRANBERRY PO Take 1 capsule by mouth every Monday, Wednesday, and Friday.     [provider]  ferrous sulfate 325 (65 FE) MG tablet Take 325 mg by mouth daily.    [provider]  levothyroxine (SYNTHROID) 100 MCG tablet TAKE 1 TABLET BY MOUTH EVERY MORNING ON AN EMPTY STOMACH. NO FOOD OR OTHER MEDICATIONS FOR 30 MIN Patient taking differently: Take 100 mcg by mouth daily before breakfast. AN EMPTY STOMACH. NO FOOD OR OTHER MEDICATIONS FOR 30 MIN 11/12/19   [name], NP  loratadine (CLARITIN) 10 MG tablet Take 10 mg by mouth daily.     [provider]   metoprolol succinate (TOPROL-XL) 25 MG 24 hr tablet Take 1 tablet (25 mg total) by mouth daily. 12/31/18   Buford Dresser, MD  Multiple Vitamins-Minerals (CENTRUM SILVER PO) Take 1 tablet by mouth daily.    [provider]  OFEV 150 MG CAPS Take 150 mg by mouth every 12 (twelve) hours. As Directed 03/16/19   [provider]  omeprazole (PRILOSEC) 20 MG capsule TAKE 1 CAPSULE BY MOUTH EVERY DAY Patient taking differently: Take 20 mg by mouth daily.  07/12/19   Collene Gobble, MD      VITAL SIGNS:  Blood pressure (!) 145/95, pulse 84, temperature 98.4 F (36.9 C), temperature source Oral, resp. rate 20, height _0  (1.575 m), weight 59.4 kg, SpO2 95 %.  PHYSICAL EXAMINATION:  Physical Exam  GENERAL:  79 y.o.-year-old Caucasian female patient lying in the bed with no  acute distress.  EYES: Pupils equal, round, reactive to light and accommodation. No scleral icterus. Extraocular muscles intact.  HEENT: Head atraumatic, normocephalic. Oropharynx and nasopharynx clear.  NECK:  Supple, no jugular venous distention. No thyroid enlargement, no tenderness.  LUNGS: Normal breath sounds bilaterally, no wheezing, rales,rhonchi or crepitation. No use of accessory muscles of respiration.  CARDIOVASCULAR: Regular rate and rhythm, S1, S2 normal. No murmurs, rubs, or gallops.  ABDOMEN: Soft, nondistended, nontender. Bowel sounds present. No organomegaly or mass.  EXTREMITIES: No pedal edema, cyanosis, or clubbing.  NEUROLOGIC: Cranial nerves II through XII are intact. Muscle strength 5/5 in all extremities. Sensation intact. Gait not checked.  PSYCHIATRIC: The patient is alert and oriented x 3.  Normal affect and good eye contact. SKIN: Right groin swelling with associated tenderness and mild erythema with ecchymosis extending lower down to the the medial thigh.  LABORATORY PANEL:   CBC No results for input(s): WBC, HGB, HCT, PLT in the last 168  hours. ------------------------------------------------------------------------------------------------------------------  Chemistries  No results for input(s): NA, K, CL, CO2, GLUCOSE, BUN, CREATININE, CALCIUM, MG, AST, ALT, ALKPHOS, BILITOT in the last 168 hours.  Invalid input(s): GFRCGP ------------------------------------------------------------------------------------------------------------------  Cardiac Enzymes No results for input(s): TROPONINI in the last 168 hours. ------------------------------------------------------------------------------------------------------------------  RADIOLOGY:  [place] Lower Ext Art Right Ltd  Result Date: 11/28/2019 CLINICAL DATA:  79 year old female status post catheterization in the right groin. Concern for pseudoaneurysm. EXAM: Right LOWER EXTREMITY ARTERIAL DUPLEX SCAN TECHNIQUE: Gray-scale sonography as well as color Doppler and duplex ultrasound was performed to evaluate the arteries of the right groin. COMPARISON:  None. FINDINGS: There is a partially thrombosed pseudoaneurysm in the right groin measuring 9.7 x 3.0 x 4.3 cm. The non thrombosed sac of the pseudoaneurysm measures 3.2 x 2.4 x 3.2 cm. The neck of the pseudoaneurysm measures approximately 1.5 cm in length and 2 mm in diameter. IMPRESSION: Partially thrombosed pseudoaneurysm in the right groin. These results were called by telephone at the time of interpretation on 11/28/2019 at 6:11 pm to provider Lavonia Drafts , who verbally acknowledged these results. Electronically Signed   By: Anner Crete M.D.   On: 11/28/2019 18:12      IMPRESSION AND PLAN:   1.  Thrombosed pseudoaneurysm of the right pseudoaneurysm.  Status post cardiac catheterization to assess treatment for pulmonary fibrosis. -The patient will be admitted to a medically monitored bed. -We will monitor CBC. -Plavix will be held off. -Vascular surgery consult will be obtained. -Dr. Lorenso Courier was notified about the  patient. -Pain management will be provided.  2.  Essential hypertension. -We will continue her Norvasc and Toprol-XL.  3.  Dyslipidemia. -We will resume statin therapy.  4.  GERD. -We will continue PPI therapy.  5.  DVT prophylaxis. -SCDs. -Medical prophylaxis is currently contraindicated due to her right groin bleeding and pseudoaneurysm    All the records are reviewed and case discussed with ED provider. The plan of care was discussed in details with the patient (and family). I answered all questions. The patient agreed to proceed with the above mentioned plan. Further management will depend upon hospital course.   CODE STATUS: Full code  Status is: Inpatient  Remains inpatient appropriate because:Ongoing active pain requiring inpatient pain management, Ongoing diagnostic testing needed not appropriate for outpatient work up, Unsafe d/c plan, IV treatments appropriate due to intensity of illness or inability to take PO and Inpatient level of care appropriate due to severity of illness   Dispo: The patient is from: Home  Anticipated d/c is to: Home              Anticipated d/c date is: 2 days              Patient currently is not medically stable to d/c.    TOTAL TIME TAKING CARE OF THIS PATIENT: 50 minutes.    Christel Mormon M.D on 11/28/2019 at 10:17 PM  [facility]   From 7 PM-7 AM, contact night-coverage [email or web address]  CC: Primary care physician; [name], NP   Note: This dictation was prepared with Dragon dictation along with smaller phrase technology. Any transcriptional typo errors that result from this process are unintentional.

## 2019-11-28 NOTE — ED Provider Notes (Signed)
[facility] Emergency Department Provider Note   ____________________________________________    I have reviewed the triage vital signs and the nursing notes.   HISTORY  Chief Complaint Leg Pain     HPI Tina Calderon is a 79 y.o. female who presents with complaints of right groin pain.  Patient notes pain is at the site of catheter insertion for a right heart cath that she had about a week and a half ago.  She reports the pain has been worsening over the last several days and is quite severe today.  She reports it is worse with any movement.  Review of records demonstrates patient had right heart cath performed given history of pulmonary disease.  Past Medical History:  Diagnosis Date  . Anginal pain ([place])   . Cancer ([place])   . CHF (congestive heart failure) ([place]) 01/22/2012  . Chronic fatigue 07/15/2014  . CVA (cerebral vascular accident) ([place]) 04/18/2018  . Family history of pulmonary fibrosis   . GERD (gastroesophageal reflux disease) 05/12/2017  . Hypertension   . Hypothyroidism   . ILD (interstitial lung disease) ([place]) 02/04/2012  . IPF (idiopathic pulmonary fibrosis) ([place]) 2013  . Mild intermittent asthma 01/29/2016  . Pelvic relaxation   . Prolapse of bladder   . Shortness of breath   . Thyroid disease     Patient Active Problem List   Diagnosis Date Noted  . Pseudoaneurysm ([place]) 11/28/2019  . Pulmonary arterial hypertension ([place])   . Genetic testing 07/06/2019  . History of CVA (cerebrovascular accident) 05/18/2019  . Heart palpitations 05/18/2019  . Shortness of breath 05/18/2019  . Family history of pulmonary fibrosis   . Therapeutic drug monitoring 03/22/2019  . Aortic atherosclerosis ([place]) 03/04/2019  . Chronic respiratory failure with hypoxia ([place]) 02/23/2019  . Pulmonary HTN ([place]) 02/23/2019  . Vertigo 10/12/2018  . Multiple thyroid nodules 07/11/2018  . 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 CVA  (cerebral vascular accident) ([place]) 04/18/2018  . Gait abnormality   . Chronic diastolic congestive heart failure ([employer])   . Acute blood loss anemia   . Anemia of chronic disease   . Lower extremity weakness 01/31/2018  . Thyroid nodule 01/31/2018  . Tachycardia 01/19/2018  . Prediabetes 12/18/2017  . Preventative health care 12/18/2017  . GERD (gastroesophageal reflux disease) 05/12/2017  . Essential hypertension 09/25/2016  . Cough variant asthma 06/13/2016  . Allergic rhinitis 01/29/2016  . Mild intermittent asthma 01/29/2016  . Cough 01/03/2015  . Chronic fatigue 07/15/2014  . Research study patient 06/28/2014  . IPF (idiopathic pulmonary fibrosis) / IPAF 02/04/2012  . Orthostatic hypotension 01/24/2012  . Normocytic anemia 01/22/2012  . Dyslipidemia, goal LDL below 70 01/22/2012  . Anemia 01/20/2012  . Hypothyroidism   . Pelvic relaxation 11/11/2011    Past Surgical History:  Procedure Laterality Date  . ANTERIOR AND POSTERIOR REPAIR  12/05/2011   Procedure: ANTERIOR (CYSTOCELE) AND POSTERIOR REPAIR (RECTOCELE);  Surgeon: [name], MD;  Location: [facility];  Service: Gynecology;  Laterality: N/A;  with cysto  . Bladder tact  15 years ago  . COLONOSCOPY WITH PROPOFOL N/A 04/20/2019   Procedure: COLONOSCOPY WITH PROPOFOL;  Surgeon: [name], MD;  Location: [facility] ENDOSCOPY;  Service: Gastroenterology;  Laterality: N/A;  . LEFT HEART CATHETERIZATION WITH CORONARY ANGIOGRAM N/A 01/22/2012   Procedure: LEFT HEART CATHETERIZATION WITH CORONARY ANGIOGRAM;  Surgeon: [name], MD;  Location: [facility] CATH LAB;  Service: Cardiovascular;  Laterality: N/A;  . NECK  SURGERY  2010  . RIGHT HEART CATH N/A 11/19/2019   Procedure: RIGHT HEART CATH;  Surgeon: [name], MD;  Location: [facility];  Service: Cardiovascular;  Laterality: N/A;  . SHOULDER SURGERY    . thyroid disease    . THYROID LOBECTOMY Left 07/17/2018   Procedure: LEFT THYROID LOBECTOMY;  Surgeon: [name], MD;  Location: [facility];  Service: General;  Laterality: Left;  . TONSILLECTOMY    . VAGINAL HYSTERECTOMY  15 years ago    Prior to Admission medications   Medication Sig Start Date End Date Taking? Authorizing Provider  acetaminophen (TYLENOL) 500 MG tablet Take 1,000 mg by mouth every 6 (six) hours as needed for mild pain or headache.     [provider]  amLODipine (NORVASC) 5 MG tablet TAKE 1 TABLET BY MOUTH EVERY DAY FOR BLOOD PRESSURE Patient taking differently: Take 5 mg by mouth daily.  11/12/19   [name], NP  atorvastatin (LIPITOR) 40 MG tablet TAKE 1 TABLET (40 MG TOTAL) BY MOUTH AT BEDTIME. FOR CHOLESTEROL. 10/14/19   [name], NP  CALCIUM CARBONATE-VITAMIN D PO Take 1 tablet by mouth daily.     [provider]  clopidogrel (PLAVIX) 75 MG tablet TAKE 1 TABLET (75 MG TOTAL) BY MOUTH DAILY. FOR STROKE PREVENTION. 05/28/19   [name], NP  CRANBERRY PO Take 1 capsule by mouth every Monday, Wednesday, and Friday.     [provider]  ferrous sulfate 325 (65 FE) MG tablet Take 325 mg by mouth daily.    [provider]  levothyroxine (SYNTHROID) 100 MCG tablet TAKE 1 TABLET BY MOUTH EVERY MORNING ON AN EMPTY STOMACH. NO FOOD OR OTHER MEDICATIONS FOR 30 MIN Patient taking differently: Take 100 mcg by mouth daily before breakfast. AN EMPTY STOMACH. NO FOOD OR OTHER MEDICATIONS FOR 30 MIN 11/12/19   [name], NP  loratadine (CLARITIN) 10 MG tablet Take 10 mg by mouth daily.     [provider]  metoprolol succinate (TOPROL-XL) 25 MG 24 hr tablet Take 1 tablet (25 mg total) by mouth daily. 12/31/18   Buford Dresser, MD  Multiple Vitamins-Minerals (CENTRUM SILVER PO) Take 1 tablet by mouth daily.    [provider]  OFEV 150 MG CAPS Take 150 mg by mouth every 12 (twelve) hours. As Directed 03/16/19   [provider]  omeprazole (PRILOSEC) 20 MG capsule TAKE 1 CAPSULE BY MOUTH EVERY  DAY Patient taking differently: Take 20 mg by mouth daily.  07/12/19   Collene Gobble, MD     Allergies Crestor [rosuvastatin calcium] and Dilaudid [hydromorphone]  Family History  Problem Relation Age of Onset  . Thyroid disease Mother   . Hypertension Mother   . Coronary artery disease Mother   . Dementia Mother   . Stroke Mother   . Aneurysm Father   . Pulmonary fibrosis Sister   . Stroke Other   . Sleep apnea Other   . Liver disease Brother   . Pulmonary fibrosis Brother   . Stroke Maternal Grandmother   . Dementia Maternal Grandmother   . Heart attack Maternal Grandfather 67  . COPD Son     Social History Social History   Tobacco Use  . Smoking status: Never Smoker  . Smokeless tobacco: Never Used  Vaping Use  . Vaping Use: Never used  Substance Use Topics  . Alcohol use: No  . Drug use: No    Review of  Systems  Constitutional: No fever/chills Eyes: No visual changes.  ENT: No sore throat. Cardiovascular: Denies chest pain. Respiratory: Denies shortness of breath. Gastrointestinal: No abdominal pain.   Genitourinary: Negative for dysuria. Musculoskeletal: As above Skin: Bruising to the groin Neurological: Negative for headaches or weakness   ____________________________________________   PHYSICAL EXAM:  VITAL SIGNS: ED Triage Vitals [11/28/19 1709]  Enc Vitals Group     BP 126/72     Pulse Rate 84     Resp 18     Temp 98.4 F (36.9 C)     Temp Source Oral     SpO2 97 %     Weight 59.4 kg (131 lb)     Height 1.575 m (_0 )     Head Circumference      Peak Flow      Pain Score 6     Pain Loc      Pain Edu?      Excl. in [place]?     Constitutional: Alert and oriented.  Nose: No congestion/rhinnorhea. Mouth/Throat: Mucous membranes are moist.   Neck:  Painless ROM Cardiovascular: Normal rate, regular rhythm.  Good peripheral circulation. Respiratory: Normal respiratory effort.  No retractions. Gastrointestinal: Soft and nontender. No  distention.  Musculoskeletal: Right groin, bruising noted, tenderness to palpation, no evidence of infection, extremity is warm and well perfused Neurologic:  Normal speech and language. No gross focal neurologic deficits are appreciated.  Skin:  Skin is warm, dry  Psychiatric: Mood and affect are normal. Speech and behavior are normal.  ____________________________________________   LABS (all labs ordered are listed, but only abnormal results are displayed)  Labs Reviewed  SARS CORONAVIRUS 2 BY RT PCR (HOSPITAL ORDER, [facility])  BASIC METABOLIC PANEL  CBC   ____________________________________________  EKG  None ____________________________________________  RADIOLOGY  Ultrasound demonstrates large pseudoaneurysm ____________________________________________   PROCEDURES  Procedure(s) performed: No  Procedures   Critical Care performed: No ____________________________________________   INITIAL IMPRESSION / ASSESSMENT AND PLAN / ED COURSE  Pertinent labs & imaging results that were available during my care of the patient were reviewed by me and considered in my medical decision making (see chart for details).  Patient presents with worsening pain at the site of catheterization performed approximately 1.5 weeks ago.  Differential includes pseudoaneurysm, infection.  IV morphine and IV Zofran ordered.  Ultrasound performed.  Ultrasound confirms pseudoaneurysm.  Discussed Dr. Lorenso Courier vascular surgery who notes patient may need open surgery given the size of pseudoaneurysm, recommends admission to medicine, hold Plavix, vascular will consult in the morning.    ____________________________________________   FINAL CLINICAL IMPRESSION(S) / ED DIAGNOSES  Pseudoaneurysm     Note:  This document was prepared using Dragon voice recognition software and may include unintentional dictation errors.   Lavonia Drafts, MD 11/28/19 [phone]

## 2019-11-29 ENCOUNTER — Other Ambulatory Visit: Payer: Medicare PPO

## 2019-11-29 ENCOUNTER — Encounter
Admission: EM | Disposition: A | Discharge status: Skilled Nursing Facility | Payer: Self-pay | Source: Home / Self Care | Attending: Internal Medicine

## 2019-11-29 ENCOUNTER — Encounter: Payer: Self-pay | Admitting: Family Medicine

## 2019-11-29 ENCOUNTER — Other Ambulatory Visit (INDEPENDENT_AMBULATORY_CARE_PROVIDER_SITE_OTHER): Payer: Self-pay | Admitting: Vascular Surgery

## 2019-11-29 DIAGNOSIS — R1031 Right lower quadrant pain: Secondary | ICD-10-CM

## 2019-11-29 DIAGNOSIS — I729 Aneurysm of unspecified site: Secondary | ICD-10-CM

## 2019-11-29 DIAGNOSIS — I724 Aneurysm of artery of lower extremity: Secondary | ICD-10-CM

## 2019-11-29 HISTORY — PX: PSEUDOANERYSM COMPRESSION: CATH118259

## 2019-11-29 LAB — CBC
HCT: 31.8 % — ABNORMAL LOW (ref 36.0–46.0)
Hemoglobin: 10.6 g/dL — ABNORMAL LOW (ref 12.0–15.0)
MCH: 30.9 pg (ref 26.0–34.0)
MCHC: 33.3 g/dL (ref 30.0–36.0)
MCV: 92.7 fL (ref 80.0–100.0)
Platelets: 142 10*3/uL — ABNORMAL LOW (ref 150–400)
RBC: 3.43 MIL/uL — ABNORMAL LOW (ref 3.87–5.11)
RDW: 14.2 % (ref 11.5–15.5)
WBC: 5.3 10*3/uL (ref 4.0–10.5)
nRBC: 0 % (ref 0.0–0.2)

## 2019-11-29 LAB — BASIC METABOLIC PANEL
Anion gap: 6 (ref 5–15)
BUN: 13 mg/dL (ref 8–23)
CO2: 27 mmol/L (ref 22–32)
Calcium: 8.6 mg/dL — ABNORMAL LOW (ref 8.9–10.3)
Chloride: 105 mmol/L (ref 98–111)
Creatinine, Ser: 0.7 mg/dL (ref 0.44–1.00)
GFR calc Af Amer: >60 mL/min (ref >60)
GFR calc non Af Amer: >60 mL/min (ref >60)
Glucose, Bld: 97 mg/dL (ref 70–99)
Potassium: 4.4 mmol/L (ref 3.5–5.1)
Sodium: 138 mmol/L (ref 135–145)

## 2019-11-29 SURGERY — PSEUDOANERYSM COMPRESSION
Anesthesia: Moderate Sedation | Laterality: Right

## 2019-11-29 MED ORDER — CEFAZOLIN SODIUM-DEXTROSE 1-4 GM/50ML-% IV SOLN
1.0000 g | Freq: Once | INTRAVENOUS | Status: DC
  Filled 2019-11-29: qty 50

## 2019-11-29 MED ORDER — FAMOTIDINE 20 MG PO TABS: 40.0000 mg | ORAL_TABLET | Freq: Once | ORAL | Status: DC | PRN

## 2019-11-29 MED ORDER — SODIUM CHLORIDE 0.9 % IV SOLN: INTRAVENOUS | Status: DC

## 2019-11-29 MED ORDER — FENTANYL CITRATE (PF) 100 MCG/2ML IJ SOLN
INTRAMUSCULAR | Status: DC | PRN
  Administered 2019-11-29: 25 ug via INTRAVENOUS

## 2019-11-29 MED ORDER — HYDROMORPHONE HCL 1 MG/ML IJ SOLN
1.0000 mg | Freq: Once | INTRAMUSCULAR | Status: AC | PRN
  Administered 2019-11-29: 1 mg via INTRAVENOUS
  Filled 2019-11-29: qty 1

## 2019-11-29 MED ORDER — FENTANYL CITRATE (PF) 100 MCG/2ML IJ SOLN
INTRAMUSCULAR | Status: AC
  Filled 2019-11-29: qty 2

## 2019-11-29 MED ORDER — DIPHENHYDRAMINE HCL 50 MG/ML IJ SOLN: 50.0000 mg | Freq: Once | INTRAMUSCULAR | Status: DC | PRN

## 2019-11-29 MED ORDER — MIDAZOLAM HCL 2 MG/2ML IJ SOLN
INTRAMUSCULAR | Status: AC
  Filled 2019-11-29: qty 2

## 2019-11-29 MED ORDER — MORPHINE SULFATE (PF) 2 MG/ML IV SOLN
2.0000 mg | INTRAVENOUS | Status: DC | PRN
  Administered 2019-11-29 – 2019-11-30 (×5): 2 mg via INTRAVENOUS
  Filled 2019-11-29 (×5): qty 1

## 2019-11-29 MED ORDER — MIDAZOLAM HCL 2 MG/ML PO SYRP
8.0000 mg | ORAL_SOLUTION | Freq: Once | ORAL | Status: DC | PRN
  Filled 2019-11-29: qty 4

## 2019-11-29 MED ORDER — MIDAZOLAM HCL 2 MG/2ML IJ SOLN
INTRAMUSCULAR | Status: DC | PRN
  Administered 2019-11-29: 1 mg via INTRAVENOUS

## 2019-11-29 MED ORDER — ONDANSETRON HCL 4 MG/2ML IJ SOLN: 4.0000 mg | Freq: Four times a day (QID) | INTRAMUSCULAR | Status: DC | PRN

## 2019-11-29 MED ORDER — THROMBIN FOR PERCUTANEOUS TREATMENT OF PSEUDOANEURYSM (5000UNITS/10ML)
5000.0000 [IU] | Freq: Once | PERCUTANEOUS | Status: DC
  Filled 2019-11-29: qty 1

## 2019-11-29 MED ORDER — METHYLPREDNISOLONE SODIUM SUCC 125 MG IJ SOLR: 125.0000 mg | Freq: Once | INTRAMUSCULAR | Status: DC | PRN

## 2019-11-29 SURGICAL SUPPLY — 1 items: TRAY LACERAT/PLASTIC (MISCELLANEOUS) ×1 IMPLANT

## 2019-11-29 NOTE — H&P (Signed)
[facility] History & Physical Update  The patient was interviewed and re-examined.  The patient's previous History and Physical has been reviewed and is unchanged.  There is no change in the plan of care. We plan to proceed with the scheduled procedure.  Leotis Pain, MD  11/29/2019, 2:51 PM

## 2019-11-29 NOTE — Plan of Care (Signed)
  Problem: Education: Goal: Knowledge of General Education information will improve Description: Including pain rating scale, medication(s)/side effects and non-pharmacologic comfort measures Outcome: Progressing

## 2019-11-29 NOTE — Op Note (Addendum)
[facility]   OPERATIVE NOTE  DATE:  11/29/2019  PRE-OPERATIVE DIAGNOSIS: Pseudoaneurysm right femoral artery  POST-OPERATIVE DIAGNOSIS: same as above  PROCEDURE: 1.   Thrombin injection right femoral artery pseudoaneurysm with ultrasound guidance  SURGEON: Leotis Pain  ASSISTANT(S): None  ANESTHESIA: Local with moderate conscious sedation using 1 mg of Versed and 25 mcg of fentanyl for approximately 15 minutes.  ESTIMATED BLOOD LOSS: Minimal  FINDING(S): 1.  Pseudoaneurysm injected with thrombin   INDICATIONS:   Tina Calderon is a 79 y.o. female who presents with a symptomatic pseudoaneurysm of the right femoral artery after recent procedure.  The anatomy appears favorable for a thrombin injection to treat the pseudoaneurysm with a relatively thin long neck.  Risks and benefits were discussed and informed consent was obtained.  DESCRIPTION: After obtaining full informed written consent, the patient was brought back to the operating room and placed supine upon the operating table.  The patient was prepped and draped in the standard fashion.  Ultrasound was used to visualize the right femoral artery and the pseudoaneurysm.  The pseudoaneurysm was somewhat medial and deep suggesting that there was a through and through stick of the vein into one of the profunda femoris artery branches as this was during a right heart cath.  After local anesthesia was performed, ultrasound was used to access the pseudoaneurysm with an 18-gauge needle.  Approximately 0.5 cc of concentrated thrombin was then injected into the pseudoaneurysm taking care not to enter the arterial circuit.  The needle was removed and pressure was held. At this point, we elected to complete the procedure.  The patient was taken to the recovery room in stable condition.   COMPLICATIONS: None  CONDITION: Stable  Leotis Pain  11/29/2019, 3:57 PM    This note was created with Dragon Medical  transcription system. Any errors in dictation are purely unintentional.

## 2019-11-29 NOTE — Consult Note (Signed)
[facility] Vascular Consult Note  MRN : [id number]  Tina Calderon is a 79 y.o. (05-01-1941) female who presents with chief complaint of  Chief Complaint  Patient presents with  . Leg Pain   History of Present Illness: The patient is a 79 year old female with multiple medical issues (see below) who recently underwent a cardiac catheterization at [facility] on November 19, 2019 who presented to the [facility] emergency department with a chief complaint of progressively worsening right groin and thigh pain.  Patient endorses a history of waking up with acute pain to the right groin on 11/28/19.  Describes the discomfort as severe which prompted her to seek medical attention in emergency department.  Patient also notes bruising tracking down the medial aspect of her thigh.  Denies any shortness of breath or chest pain.  Denies any fever, nausea vomiting.  Ultrasound 11/28/19: There is a partially thrombosed pseudoaneurysm in the right groin measuring 9.7 x 3.0 x 4.3 cm. The non thrombosed sac of the pseudoaneurysm measures 3.2 x 2.4 x 3.2 cm. The neck of the pseudoaneurysm measures approximately 1.5 cm in length and 2 mm in diameter.  Vascular surgery was consulted by Dr. Sidney Ace for pseudoaneurysm repair.  Current Facility-Administered Medications  Medication Dose Route Frequency Provider Last Rate Last Admin  . 0.9 %  sodium chloride infusion   Intravenous Continuous Mansy, Jan A, MD 100 mL/hr at 11/29/19 0932 Other (enter comment in med admin window) at 11/29/19 0609  . acetaminophen (TYLENOL) tablet 650 mg  650 mg Oral Q6H PRN Mansy, Jan A, MD       Or  . acetaminophen (TYLENOL) suppository 650 mg  650 mg Rectal Q6H PRN Mansy, Jan A, MD      . amLODipine (NORVASC) tablet 5 mg  5 mg Oral Daily Mansy, Jan A, MD      . atorvastatin (LIPITOR) tablet 40 mg  40 mg Oral QHS Mansy, Jan A, MD   40 mg at 11/29/19 0031  . calcium-vitamin D (OSCAL  WITH D) 500-200 MG-UNIT per tablet 1 tablet  1 tablet Oral Q breakfast Mansy, Jan A, MD      . ferrous sulfate tablet 325 mg  325 mg Oral Daily Mansy, Jan A, MD   325 mg at 11/29/19 0924  . levothyroxine (SYNTHROID) tablet 100 mcg  100 mcg Oral QAC breakfast Mansy, Jan A, MD   100 mcg at 11/29/19 0609  . loratadine (CLARITIN) tablet 10 mg  10 mg Oral Daily Mansy, Jan A, MD   10 mg at 11/29/19 0924  . magnesium hydroxide (MILK OF MAGNESIA) suspension 30 mL  30 mL Oral Daily PRN Mansy, Jan A, MD      . metoprolol succinate (TOPROL-XL) 24 hr tablet 25 mg  25 mg Oral Daily Mansy, Jan A, MD   25 mg at 11/29/19 0857  . morphine 2 MG/ML injection 2 mg  2 mg Intravenous Q4H PRN Sharion Settler, NP   2 mg at 11/29/19 1009  . multivitamin with minerals tablet   Oral Daily Mansy, Jan A, MD      . Nintedanib (OFEV) CAPS 150 mg  150 mg Oral Q12H Mansy, Jan A, MD      . ondansetron [facility]) tablet 4 mg  4 mg Oral Q6H PRN Mansy, Jan A, MD       Or  . ondansetron [facility]) injection 4 mg  4 mg Intravenous Q6H PRN Mansy, Arvella Merles, MD      .  pantoprazole (PROTONIX) EC tablet 40 mg  40 mg Oral Daily Mansy, Jan A, MD   40 mg at 11/29/19 0859  . traZODone (DESYREL) tablet 25 mg  25 mg Oral QHS PRN Mansy, Arvella Merles, MD       Past Medical History:  Diagnosis Date  . Anginal pain ([place])   . Cancer ([place])   . CHF (congestive heart failure) ([place]) 01/22/2012  . Chronic fatigue 07/15/2014  . CVA (cerebral vascular accident) ([place]) 04/18/2018  . Family history of pulmonary fibrosis   . GERD (gastroesophageal reflux disease) 05/12/2017  . Hypertension   . Hypothyroidism   . ILD (interstitial lung disease) ([place]) 02/04/2012  . IPF (idiopathic pulmonary fibrosis) ([place]) 2013  . Mild intermittent asthma 01/29/2016  . Pelvic relaxation   . Prolapse of bladder   . Shortness of breath   . Thyroid disease    Past Surgical History:  Procedure Laterality Date  . ANTERIOR AND POSTERIOR REPAIR  12/05/2011   Procedure: ANTERIOR (CYSTOCELE)  AND POSTERIOR REPAIR (RECTOCELE);  Surgeon: [name], MD;  Location: [facility];  Service: Gynecology;  Laterality: N/A;  with cysto  . Bladder tact  15 years ago  . COLONOSCOPY WITH PROPOFOL N/A 04/20/2019   Procedure: COLONOSCOPY WITH PROPOFOL;  Surgeon: [name], MD;  Location: [facility] ENDOSCOPY;  Service: Gastroenterology;  Laterality: N/A;  . LEFT HEART CATHETERIZATION WITH CORONARY ANGIOGRAM N/A 01/22/2012   Procedure: LEFT HEART CATHETERIZATION WITH CORONARY ANGIOGRAM;  Surgeon: [name], MD;  Location: [facility] CATH LAB;  Service: Cardiovascular;  Laterality: N/A;  . NECK SURGERY  2010  . RIGHT HEART CATH N/A 11/19/2019   Procedure: RIGHT HEART CATH;  Surgeon: [name], MD;  Location: [facility] CV LAB;  Service: Cardiovascular;  Laterality: N/A;  . SHOULDER SURGERY    . thyroid disease    . THYROID LOBECTOMY Left 07/17/2018   Procedure: LEFT THYROID LOBECTOMY;  Surgeon: [name], MD;  Location: [facility];  Service: General;  Laterality: Left;  . TONSILLECTOMY    . VAGINAL HYSTERECTOMY  15 years ago   Social History Social History   Tobacco Use  . Smoking status: Never Smoker  . Smokeless tobacco: Never Used  Vaping Use  . Vaping Use: Never used  Substance Use Topics  . Alcohol use: No  . Drug use: No   Family History Family History  Problem Relation Age of Onset  . Thyroid disease Mother   . Hypertension Mother   . Coronary artery disease Mother   . Dementia Mother   . Stroke Mother   . Aneurysm Father   . Pulmonary fibrosis Sister   . Stroke Other   . Sleep apnea Other   . Liver disease Brother   . Pulmonary fibrosis Brother   . Stroke Maternal Grandmother   . Dementia Maternal Grandmother   . Heart attack Maternal Grandfather 67  . COPD Son   Denies family history of peripheral artery disease, venous disease or renal disease.  Allergies  Allergen Reactions  . Crestor [Rosuvastatin Calcium] Other (See Comments)    Myalgias, muscle weakness  .  Dilaudid [Hydromorphone] Nausea And Vomiting   REVIEW OF SYSTEMS (Negative unless checked)  Constitutional: _0 Weight loss  _1 Fever  _2 Chills Cardiac: _3 Chest pain   _4 Chest pressure   _5 Palpitations   _6 Shortness of breath when laying flat   _7 Shortness of breath at rest   _8 Shortness of breath with exertion. Vascular:  _9 Pain in legs with walking   _10 Pain in legs at  rest   _0 Pain in legs when laying flat   _1 Claudication   _2 Pain in feet when walking  _3 Pain in feet at rest  _4 Pain in feet when laying flat   _5 History of DVT   _6 Phlebitis   _7 Swelling in legs   _8 Varicose veins   _9 Non-healing ulcers Pulmonary:   _10 Uses home oxygen   _11 Productive cough   _12 Hemoptysis   _13 Wheeze  _14 COPD   _15 Asthma Neurologic:  _16 Dizziness  _17 Blackouts   _18 Seizures   _19 History of stroke   _20 History of TIA  _21 Aphasia   _22 Temporary blindness   _23 Dysphagia   _24 Weakness or numbness in arms   _25 Weakness or numbness in legs Musculoskeletal:  _26 Arthritis   _27 Joint swelling   _28 Joint pain   _29 Low back pain Hematologic:  _30 Easy bruising  _31 Easy bleeding   _32 Hypercoagulable state   _33 Anemic  _34 Hepatitis Gastrointestinal:  _35 Blood in stool   _36 Vomiting blood  _37 Gastroesophageal reflux/heartburn   _38 Difficulty swallowing. Genitourinary:  _39 Chronic kidney disease   _40 Difficult urination  _41 Frequent urination  _42 Burning with urination   _43 Blood in urine Skin:  _44 Rashes   _45 Ulcers   _46 Wounds Psychological:  _47 History of anxiety   _48  History of major depression.  Physical Examination  Vitals:   11/28/19 2323 11/29/19 0054 11/29/19 0520 11/29/19 0743  BP: 136/73 (!) 148/82 122/72 116/66  Pulse: 69 68 62 68  Resp: _49 Temp:  98.3 F (36.8 C) (!) 97.5 F (36.4 C) 98 F (36.7 C)  TempSrc:   Oral Oral  SpO2: 99% 100% 100% 99%  Weight:      Height:       Body mass index is 23.96 kg/m. Gen:  WD/WN, NAD Head: [place]/AT, No temporalis wasting. Prominent temp pulse not noted. Ear/Nose/Throat: Hearing grossly intact,  nares w/o erythema or drainage, oropharynx w/o Erythema/Exudate Eyes: Sclera non-icteric, conjunctiva clear Neck: Trachea midline.  No JVD.  Pulmonary:  Good air movement, respirations not labored, equal bilaterally.  Cardiac: RRR, normal S1, S2. Vascular:  Vessel Right Left  Radial Palpable Palpable  Ulnar Palpable Palpable  Brachial Palpable Palpable  Carotid Palpable, without bruit Palpable, without bruit  Aorta Not palpable N/A  Femoral Palpable Palpable  Popliteal Palpable Palpable  PT Palpable Palpable  DP Palpable Palpable   Right lower extremity: Thigh soft.  Calf soft.  Extremities warm distally to toes.  There is a large amount of ecchymosis tracking down the medial aspect of the thigh.  Tender to palpation in the groin.  Gastrointestinal: soft, non-tender/non-distended. No guarding/reflex.  Musculoskeletal: M/S 5/5 throughout.  Extremities without ischemic changes.  No deformity or atrophy. No edema. Neurologic: Sensation grossly intact in extremities.  Symmetrical.  Speech is fluent. Motor exam as listed above. Psychiatric: Judgment intact, Mood & affect appropriate for pt's clinical situation. Dermatologic: No rashes or ulcers noted.  No cellulitis or open wounds. Lymph : No Cervical, Axillary, or Inguinal lymphadenopathy.  CBC Lab Results  Component Value Date   WBC 5.3 11/29/2019   HGB 10.6 (L) 11/29/2019   HCT 31.8 (L) 11/29/2019   MCV 92.7 11/29/2019   PLT 142 (L) 11/29/2019   BMET    Component Value Date/Time   NA 138 11/29/2019 0445   NA 141 11/15/2019 1235   NA 138 12/18/2013 1504   K 4.4 11/29/2019 0445   K 3.4 (L) 12/18/2013 1504   CL 105 11/29/2019 0445   CL 103 12/18/2013 1504   CO2 27 11/29/2019 0445   CO2 24 12/18/2013 1504  GLUCOSE 97 11/29/2019 0445   GLUCOSE 125 (H) 12/18/2013 1504   BUN 13 11/29/2019 0445   BUN 14 11/15/2019 1235   BUN 17 12/18/2013 1504   CREATININE 0.70 11/29/2019 0445   CREATININE 0.88 12/18/2013 1504   CALCIUM  8.6 (L) 11/29/2019 0445   CALCIUM 8.5 12/18/2013 1504   GFRNONAA >60 11/29/2019 0445   GFRNONAA >60 12/18/2013 1504   GFRAA >60 11/29/2019 0445   GFRAA >60 12/18/2013 1504   Estimated Creatinine Clearance: 45.1 mL/min (by C-G formula based on SCr of 0.7 mg/dL).  COAG Lab Results  Component Value Date   INR 1.03 04/18/2018   INR 1.14 01/20/2012   Radiology CARDIAC CATHETERIZATION  Result Date: 11/19/2019 Elevated right heart pressures with significantly increased transpulmonic gradient with severe pulmonary arterial hypertension with PA pressure 68/31 and mean pressure 46 mmHg. PVR: 5.9 WU (Fick) and 7.1 WU (Thermo) RECOMMENDATION: The patient will follow-up with Dr. [name] and Dr. Harrell Gave.  [place] Lower Ext Art Right Ltd  Result Date: 11/28/2019 CLINICAL DATA:  79 year old female status post catheterization in the right groin. Concern for pseudoaneurysm. EXAM: Right LOWER EXTREMITY ARTERIAL DUPLEX SCAN TECHNIQUE: Gray-scale sonography as well as color Doppler and duplex ultrasound was performed to evaluate the arteries of the right groin. COMPARISON:  None. FINDINGS: There is a partially thrombosed pseudoaneurysm in the right groin measuring 9.7 x 3.0 x 4.3 cm. The non thrombosed sac of the pseudoaneurysm measures 3.2 x 2.4 x 3.2 cm. The neck of the pseudoaneurysm measures approximately 1.5 cm in length and 2 mm in diameter. IMPRESSION: Partially thrombosed pseudoaneurysm in the right groin. These results were called by telephone at the time of interpretation on 11/28/2019 at 6:11 pm to provider Lavonia Drafts , who verbally acknowledged these results. Electronically Signed   By: Anner Crete M.D.   On: 11/28/2019 18:12   Assessment/Plan The patient is a 79 year old female with multiple medical issues (see below) who recently underwent a cardiac catheterization at [place] [facility] on November 19, 2019 who presented to the [facility] emergency department with a chief  complaint of progressively worsening right groin and thigh pain.  1.  Right groin pseudoaneurysm: Patient underwent cardiac catheterization on November 19, 2019.  On November 28, 2019 the patient began to experience an acute onset of severe right groin pain.  Is what prompted her to seek medical attention in the emergency department.  A large pseudoaneurysm was confirmed on ultrasound.  Recommend undergoing a thrombin injection and attempt to repair the pseudoaneurysm.  We will plan on this this afternoon with Dr. Lucky Cowboy  2.  Dyslipidemia: On statin and Plavix. Encouraged good control as its slows the progression of atherosclerotic disease.  3.  Prediabetes: Diet controlled. Encouraged good control as its slows the progression of atherosclerotic disease  Shayne Deerman A Sheridan Hew, PA-C  11/29/2019 11:02 AM  This note was created with Dragon medical transcription system.  Any error is purely unintentional.

## 2019-11-29 NOTE — Progress Notes (Signed)
PROGRESS NOTE    Tina Calderon  MVH:[id number] DOB: 1940-07-02 DOA: 11/28/2019 PCP: [name], NP   Brief Narrative:  Tina Calderon  is a 79 y.o. Caucasian female with a known history of CHF, hypertension, hypothyroidism, interstitial lung disease who presented to the emergency room with acute onset of right thigh swelling and pain with enlarging ecchymosis after having cardiac catheterization about 11 days ago to determine if the patient is a candidate for therapy for pulmonary fibrosis. Ultrasound with partially thrombosed pseudoaneurysm in the right groin.  Vascular surgery was consulted.  Subjective: Patient continued to experience right groin pain.  Stating that pain medication is about to wear off now as her pain started to get worse again.  She was waiting for the procedure later today.  Assessment & Plan:   Active Problems:   Pseudoaneurysm (HCC)  Right groin pseudoaneurysm.  Patient underwent cardiac catheterization on November 19, 2019.  He started experiencing worsening groin pain on 11/28/2019.  Ultrasound with a large pseudoaneurysm, with partially thrombosed.  Vascular surgery was consulted and they are planning to do a thrombin injection and attempt to repair the pseudoaneurysm later today. -Continue with pain management.  Essential hypertension. -Continue home dose of Norvasc and Toprol-XL.  Dyslipidemia. -Continue statin. -Holding home dose of Plavix for procedure now.  Prediabetes.  Currently diet controlled.  Hypothyroidism. -Continue home dose of Synthroid.  Objective: Vitals:   11/29/19 0054 11/29/19 0520 11/29/19 0743 11/29/19 1208  BP: (!) 148/82 122/72 116/66 128/69  Pulse: 68 62 68 70  Resp: _0 Temp: 98.3 F (36.8 C) (!) 97.5 F (36.4 C) 98 F (36.7 C) 98 F (36.7 C)  TempSrc:  Oral Oral Oral  SpO2: 100% 100% 99% 96%  Weight:      Height:        Intake/Output Summary (Last 24 hours) at 11/29/2019 1257 Last data filed at  11/29/2019 0400 Gross per 24 hour  Intake 341.86 ml  Output --  Net 341.86 ml   Filed Weights   11/28/19 1709  Weight: 59.4 kg    Examination:  General exam: Appears calm and comfortable  Respiratory system: Clear to auscultation. Respiratory effort normal. Cardiovascular system: S1 & S2 heard, RRR. No JVD, murmurs, rubs, gallops or clicks. Gastrointestinal system: Soft, nontender, nondistended, bowel sounds positive. Central nervous system: Alert and oriented. No focal neurological deficits. Extremities: No LE edema, no cyanosis, pulses intact and symmetrical.  Right groin tenderness with some edema and ecchymosis. Psychiatry: Judgement and insight appear normal. Mood & affect appropriate.    DVT prophylaxis: SCDs. Code Status: Full Family Communication: Discussed with patient Disposition Plan:  Status is: Inpatient  Remains inpatient appropriate because:Inpatient level of care appropriate due to severity of illness   Dispo: The patient is from: Home              Anticipated d/c is to: Home              Anticipated d/c date is: 2 days              Patient currently is not medically stable to d/c.   Consultants:   Vascular surgery  Procedures:  Antimicrobials:   Data Reviewed: I have personally reviewed following labs and imaging studies  CBC: Recent Labs  Lab 11/29/19 0445  WBC 5.3  HGB 10.6*  HCT 31.8*  MCV 92.7  PLT 841*   Basic Metabolic Panel: Recent Labs  Lab 11/29/19 0445  NA 138  K 4.4  CL 105  CO2 27  GLUCOSE 97  BUN 13  CREATININE 0.70  CALCIUM 8.6*   GFR: Estimated Creatinine Clearance: 45.1 mL/min (by C-G formula based on SCr of 0.7 mg/dL). Liver Function Tests: No results for input(s): AST, ALT, ALKPHOS, BILITOT, PROT, ALBUMIN in the last 168 hours. No results for input(s): LIPASE, AMYLASE in the last 168 hours. No results for input(s): AMMONIA in the last 168 hours. Coagulation Profile: No results for input(s): INR, PROTIME in  the last 168 hours. Cardiac Enzymes: No results for input(s): CKTOTAL, CKMB, CKMBINDEX, TROPONINI in the last 168 hours. BNP (last 3 results) No results for input(s): PROBNP in the last 8760 hours. HbA1C: No results for input(s): HGBA1C in the last 72 hours. CBG: No results for input(s): GLUCAP in the last 168 hours. Lipid Profile: No results for input(s): CHOL, HDL, LDLCALC, TRIG, CHOLHDL, LDLDIRECT in the last 72 hours. Thyroid Function Tests: No results for input(s): TSH, T4TOTAL, FREET4, T3FREE, THYROIDAB in the last 72 hours. Anemia Panel: No results for input(s): VITAMINB12, FOLATE, FERRITIN, TIBC, IRON, RETICCTPCT in the last 72 hours. Sepsis Labs: No results for input(s): PROCALCITON, LATICACIDVEN in the last 168 hours.  Recent Results (from the past 240 hour(s))  SARS Coronavirus 2 by RT PCR (hospital order, performed in [facility] hospital lab) Nasopharyngeal Nasopharyngeal Swab     Status: None   Collection Time: 11/28/19  9:52 PM   Specimen: Nasopharyngeal Swab  Result Value Ref Range Status   SARS Coronavirus 2 NEGATIVE NEGATIVE Final    Comment: (NOTE) SARS-CoV-2 target nucleic acids are NOT DETECTED.  The SARS-CoV-2 RNA is generally detectable in upper and lower respiratory specimens during the acute phase of infection. The lowest concentration of SARS-CoV-2 viral copies this assay can detect is 250 copies / mL. A negative result does not preclude SARS-CoV-2 infection and should not be used as the sole basis for treatment or other patient management decisions.  A negative result may occur with improper specimen collection / handling, submission of specimen other than nasopharyngeal swab, presence of viral mutation(s) within the areas targeted by this assay, and inadequate number of viral copies (<250 copies / mL). A negative result must be combined with clinical observations, patient history, and epidemiological information.  Fact Sheet for Patients:    StrictlyIdeas.no  Fact Sheet for Healthcare Providers: BankingDealers.co.za  This test is not yet approved or  cleared by the [place] FDA and has been authorized for detection and/or diagnosis of SARS-CoV-2 by FDA under an Emergency Use Authorization (EUA).  This EUA will remain in effect (meaning this test can be used) for the duration of the COVID-19 declaration under Section 564(b)(1) of the Act, 21 U.S.C. section 360bbb-3(b)(1), unless the authorization is terminated or revoked sooner.  Performed at [facility], [address], [place], [place] [address]      Radiology Studies: [place] Lower Ext Art Right Ltd  Result Date: 11/28/2019 CLINICAL DATA:  79 year old female status post catheterization in the right groin. Concern for pseudoaneurysm. EXAM: Right LOWER EXTREMITY ARTERIAL DUPLEX SCAN TECHNIQUE: Gray-scale sonography as well as color Doppler and duplex ultrasound was performed to evaluate the arteries of the right groin. COMPARISON:  None. FINDINGS: There is a partially thrombosed pseudoaneurysm in the right groin measuring 9.7 x 3.0 x 4.3 cm. The non thrombosed sac of the pseudoaneurysm measures 3.2 x 2.4 x 3.2 cm. The neck of the pseudoaneurysm measures approximately 1.5 cm in length and 2 mm in diameter.  IMPRESSION: Partially thrombosed pseudoaneurysm in the right groin. These results were called by telephone at the time of interpretation on 11/28/2019 at 6:11 pm to provider Lavonia Drafts , who verbally acknowledged these results. Electronically Signed   By: Anner Crete M.D.   On: 11/28/2019 18:12    Scheduled Meds: . amLODipine  5 mg Oral Daily  . atorvastatin  40 mg Oral QHS  . calcium-vitamin D  1 tablet Oral Q breakfast  . ferrous sulfate  325 mg Oral Daily  . levothyroxine  100 mcg Oral QAC breakfast  . loratadine  10 mg Oral Daily  . metoprolol succinate  25 mg Oral Daily  . multivitamin with minerals    Oral Daily  . Nintedanib  150 mg Oral Q12H  . pantoprazole  40 mg Oral Daily   Continuous Infusions: . sodium chloride 100 mL/hr at 11/29/19 0609     LOS: 1 day   Time spent: 40 minutes.  Lorella Nimrod, MD [facility]  If 7PM-7AM, please contact night-coverage [email or web address]  11/29/2019, 12:57 PM   This record has been created using Systems analyst. Errors have been sought and corrected,but may not always be located. Such creation errors do not reflect on the standard of care.

## 2019-11-30 ENCOUNTER — Ambulatory Visit: Payer: Medicare PPO

## 2019-11-30 ENCOUNTER — Encounter: Payer: Self-pay | Admitting: Vascular Surgery

## 2019-11-30 LAB — CBC
HCT: 30.8 % — ABNORMAL LOW (ref 36.0–46.0)
Hemoglobin: 10 g/dL — ABNORMAL LOW (ref 12.0–15.0)
MCH: 30.9 pg (ref 26.0–34.0)
MCHC: 32.5 g/dL (ref 30.0–36.0)
MCV: 95.1 fL (ref 80.0–100.0)
Platelets: 157 10*3/uL (ref 150–400)
RBC: 3.24 MIL/uL — ABNORMAL LOW (ref 3.87–5.11)
RDW: 13.8 % (ref 11.5–15.5)
WBC: 5.3 10*3/uL (ref 4.0–10.5)
nRBC: 0 % (ref 0.0–0.2)

## 2019-11-30 MED ORDER — CLOPIDOGREL BISULFATE 75 MG PO TABS
75.0000 mg | ORAL_TABLET | Freq: Every day | ORAL | Status: DC
  Administered 2019-11-30 – 2019-12-02 (×3): 75 mg via ORAL
  Filled 2019-11-30 (×3): qty 1

## 2019-11-30 MED ORDER — KETOROLAC TROMETHAMINE 15 MG/ML IJ SOLN
15.0000 mg | Freq: Four times a day (QID) | INTRAMUSCULAR | Status: DC | PRN
  Administered 2019-11-30 – 2019-12-01 (×2): 15 mg via INTRAVENOUS
  Filled 2019-11-30 (×2): qty 1

## 2019-11-30 MED ORDER — HYDROCODONE-ACETAMINOPHEN 5-325 MG PO TABS
1.0000 | ORAL_TABLET | ORAL | Status: DC | PRN
  Administered 2019-11-30: 1 via ORAL
  Filled 2019-11-30: qty 1

## 2019-11-30 MED ORDER — TRAMADOL HCL 50 MG PO TABS
50.0000 mg | ORAL_TABLET | Freq: Two times a day (BID) | ORAL | Status: DC | PRN
  Administered 2019-11-30 – 2019-12-01 (×2): 50 mg via ORAL
  Filled 2019-11-30 (×2): qty 1

## 2019-11-30 NOTE — Evaluation (Signed)
Physical Therapy Evaluation Patient Details Name: Tina Calderon MRN: [id number] DOB: 11-30-1940 Today's Date: 11/30/2019   History of Present Illness  Patient is a 79 y.o. female who presents with a symptomatic pseudoaneurysm of the right femoral artery after recent cardiac catheterization. s/p Thrombin injection right femoral artery pseudoaneurysm with ultrasound guidance on 6/21. Past medical history of  CHF, hypertension, hypothyroidism, interstitial lung disease with oxygen at baseline.   Clinical Impression  PT evaluation complete. Patient initially has no pain at rest. With activity, patient reports 4-5/10 pain in right groin and requires assistance with bed mobility, transfers, and ambulation. Patient was able to get out of bed, walk short distance to bed side commode with rolling walker and then back to bed. Ambulation is limited due to groin pain as well as reported dizziness with upright activity. Patient does express that she does not want to take the current pain medication due to concerns with dizziness and having bad dreams. At this time, patient would be unsafe to manage at home given physical limitations, burden of care, fall risk and SNF is recommended. Patient states she is agreeable to short term rehab if needed but prefers to go home if possible. Recommend to continue PT to address functional deficits to maximize independence and safety with mobility and return to PLOF.     Follow Up Recommendations SNF    Equipment Recommendations  Rolling walker with 5" wheels    Recommendations for Other Services       Precautions / Restrictions Precautions Precautions: Fall Restrictions Weight Bearing Restrictions: No      Mobility  Bed Mobility Overal bed mobility: Needs Assistance Bed Mobility: Supine to Sit;Sit to Supine     Supine to sit: Min assist;HOB elevated Sit to supine: Min assist   General bed mobility comments: assistance for RLE support. verbal cues for  technique   Transfers Overall transfer level: Needs assistance Equipment used: Rolling walker (2 wheeled) Transfers: Sit to/from Stand Sit to Stand: Min assist;Mod assist         General transfer comment: Mod A to stand with bed in lowest position. Min A for standing from bed side commode. verbal cues for safety and technique.   Ambulation/Gait Ambulation/Gait assistance: Min assist Gait Distance (Feet): 2 Feet Assistive device: Rolling walker (2 wheeled) Gait Pattern/deviations: Antalgic;Decreased stance time - right Gait velocity: decreased    General Gait Details: ambulation distance is limited by right groin pain and dizziness with upright activity. standing tolerance less than 2 minutes   Stairs            Wheelchair Mobility    Modified Rankin (Stroke Patients Only)       Balance Overall balance assessment: Needs assistance Sitting-balance support: Feet supported;Bilateral upper extremity supported Sitting balance-Leahy Scale: Fair     Standing balance support: Bilateral upper extremity supported Standing balance-Leahy Scale: Fair Standing balance comment: with rolling walker for UE support                              Pertinent Vitals/Pain Pain Assessment: 0-10 Pain Score: 5  (4-5) Pain Location: right groin  Pain Descriptors / Indicators: Operative site guarding Pain Intervention(s): Limited activity within patient's tolerance;Monitored during session;Repositioned    Home Living Family/patient expects to be discharged to:: Private residence Living Arrangements: Spouse/significant other Available Help at Discharge: Family;Available PRN/intermittently Type of Home: Apartment Home Access: Level entry     Home Layout: One level  Home Equipment: [place] - 4 wheels;Shower seat Additional Comments: patient is the caregiver 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 ocasional use of rollator for community ambulation      Hand Dominance   Dominant Hand: Right    Extremity/Trunk Assessment   Upper Extremity Assessment Upper Extremity Assessment: Overall WFL for tasks assessed    Lower Extremity Assessment Lower Extremity Assessment: RLE deficits/detail (LLE WFL ) RLE Deficits / Details: dorsiflexion/plantarflexion 5/5. patient guarding due to right groin pain with active movement  RLE: Unable to fully assess due to pain RLE Sensation: WNL       Communication   Communication: No difficulties  Cognition Arousal/Alertness: Awake/alert Behavior During Therapy: WFL for tasks assessed/performed Overall Cognitive Status: Within Functional Limits for tasks assessed                                        General Comments      Exercises     Assessment/Plan    PT Assessment Patient needs continued PT services  PT Problem List Decreased strength;Decreased range of motion;Decreased activity tolerance;Decreased mobility;Decreased balance;Decreased knowledge of use of DME;Pain       PT Treatment Interventions DME instruction;Gait training;Functional mobility training;Therapeutic activities;Therapeutic exercise;Balance training    PT Goals (Current goals can be found in the Care Plan section)  Acute Rehab PT Goals Patient Stated Goal:  to be able to walk and move right leg  PT Goal Formulation: With patient Time For Goal Achievement: 12/14/19 Potential to Achieve Goals: Good    Frequency Min 2X/week   Barriers to discharge        Co-evaluation               AM-PAC PT "6 Clicks" Mobility  Outcome Measure Help needed turning from your back to your side while in a flat bed without using bedrails?: A Little Help needed moving from lying on your back to sitting on the side of a flat bed without using bedrails?: A Little Help needed moving to and from a bed to a chair  (including a wheelchair)?: A Lot Help needed standing up from a chair using your arms (e.g., wheelchair or bedside chair)?: A Little Help needed to walk in hospital room?: A Little Help needed climbing 3-5 steps with a railing? : A Lot 6 Click Score: 16    End of Session Equipment Utilized During Treatment: Oxygen (oxygen removed by patient for part of session ) Activity Tolerance: Patient limited by pain (limited by dizziness with upright activity ) Patient left: in bed;with call bell/phone within reach;with bed alarm set;with SCD's reapplied Nurse Communication: Mobility status PT Visit Diagnosis: Unsteadiness on feet (R26.81);Difficulty in walking, not elsewhere classified (R26.2)    Time: 1478-2956 PT Time Calculation (min) (ACUTE ONLY): 35 min   Charges:   PT Evaluation $PT Eval Moderate Complexity: 1 Mod PT Treatments $Therapeutic Activity: 8-22 mins       Minna Merritts, PT, MPT  Percell Locus 11/30/2019, 3:18 PM

## 2019-11-30 NOTE — Progress Notes (Signed)
PROGRESS NOTE    Tina Calderon  QIW:[id number] DOB: 03-Sep-1940 DOA: 11/28/2019 PCP: [name], NP   Brief Narrative:  Tina Calderon  is a 79 y.o. Caucasian female with a known history of CHF, hypertension, hypothyroidism, interstitial lung disease who presented to the emergency room with acute onset of right thigh swelling and pain with enlarging ecchymosis after having cardiac catheterization about 11 days ago to determine if the patient is a candidate for therapy for pulmonary fibrosis. Ultrasound with partially thrombosed pseudoaneurysm in the right groin.  Vascular surgery was consulted.  Subjective: Patient continued to have right groin pain, stating that she had a very rough night.  She was given one-time dose of Dilaudid which helped.  Hydrocodone was listed in her allergies, per patient she did develop some nausea about 10 years ago when she received some Dilaudid.  No concern when she got it last night.  She is still unable to move her leg without sharp pain.  Assessment & Plan:   Active Problems:   Pseudoaneurysm (HCC)   Rt groin pain  Right groin pseudoaneurysm.  Patient underwent cardiac catheterization on November 19, 2019.  He started experiencing worsening groin pain on 11/28/2019.  Ultrasound with a large pseudoaneurysm, with partially thrombosed.  Vascular surgery was consulted s/p thrombin injection. Continue right groin pain. -Pain management. -PT/OT evaluation for disposition, home health/SNF.  Patient really wants to go back home.  She was also very reluctant for home health services stating that she has husband and granddaughter at home to help her and she is not comfortable if other people's are coming to her place.  Essential hypertension. -Continue home dose of Norvasc and Toprol-XL.  Dyslipidemia. -Continue statin. -Holding home dose of Plavix for procedure now.  Prediabetes.  Currently diet controlled.  Hypothyroidism. -Continue home dose of  Synthroid.  Objective: Vitals:   11/29/19 1630 11/29/19 1652 11/29/19 2344 11/30/19 0809  BP: 113/64 122/69 124/71 (!) 151/84  Pulse: 70 64 70 81  Resp: (!) _0 Temp:  97.8 F (36.6 C) 98.1 F (36.7 C) 97.6 F (36.4 C)  TempSrc:  Oral Oral Oral  SpO2: 98% 100% 100% 100%  Weight:      Height:        Intake/Output Summary (Last 24 hours) at 11/30/2019 1345 Last data filed at 11/29/2019 1500 Gross per 24 hour  Intake 1048.99 ml  Output --  Net 1048.99 ml   Filed Weights   11/28/19 1709  Weight: 59.4 kg    Examination:  General exam: Appears calm and comfortable  Respiratory system: Clear to auscultation. Respiratory effort normal. Cardiovascular system: S1 & S2 heard, RRR. No JVD, murmurs, rubs, gallops or clicks. Gastrointestinal system: Soft, nontender, nondistended, bowel sounds positive. Central nervous system: Alert and oriented. No focal neurological deficits. Extremities: No LE edema, no cyanosis, pulses intact and symmetrical.  Right groin tenderness with some edema and ecchymosis. Psychiatry: Judgement and insight appear normal.   DVT prophylaxis: SCDs. Code Status: Full Family Communication: Discussed with patient Disposition Plan:  Status is: Inpatient  Remains inpatient appropriate because:Inpatient level of care appropriate due to severity of illness   Dispo: The patient is from: Home              Anticipated d/c is to: Home              Anticipated d/c date is: 1 day.              Patient  currently is not medically stable to d/c.  Patient continued to have uncontrolled pain.  Getting PT/OT evaluation to check her mobility before discharging home.   Consultants:   Vascular surgery  Procedures:  Antimicrobials:   Data Reviewed: I have personally reviewed following labs and imaging studies  CBC: Recent Labs  Lab 11/29/19 0445 11/30/19 0549  WBC 5.3 5.3  HGB 10.6* 10.0*  HCT 31.8* 30.8*  MCV 92.7 95.1  PLT 142* 885   Basic  Metabolic Panel: Recent Labs  Lab 11/29/19 0445  NA 138  K 4.4  CL 105  CO2 27  GLUCOSE 97  BUN 13  CREATININE 0.70  CALCIUM 8.6*   GFR: Estimated Creatinine Clearance: 45.1 mL/min (by C-G formula based on SCr of 0.7 mg/dL). Liver Function Tests: No results for input(s): AST, ALT, ALKPHOS, BILITOT, PROT, ALBUMIN in the last 168 hours. No results for input(s): LIPASE, AMYLASE in the last 168 hours. No results for input(s): AMMONIA in the last 168 hours. Coagulation Profile: No results for input(s): INR, PROTIME in the last 168 hours. Cardiac Enzymes: No results for input(s): CKTOTAL, CKMB, CKMBINDEX, TROPONINI in the last 168 hours. BNP (last 3 results) No results for input(s): PROBNP in the last 8760 hours. HbA1C: No results for input(s): HGBA1C in the last 72 hours. CBG: No results for input(s): GLUCAP in the last 168 hours. Lipid Profile: No results for input(s): CHOL, HDL, LDLCALC, TRIG, CHOLHDL, LDLDIRECT in the last 72 hours. Thyroid Function Tests: No results for input(s): TSH, T4TOTAL, FREET4, T3FREE, THYROIDAB in the last 72 hours. Anemia Panel: No results for input(s): VITAMINB12, FOLATE, FERRITIN, TIBC, IRON, RETICCTPCT in the last 72 hours. Sepsis Labs: No results for input(s): PROCALCITON, LATICACIDVEN in the last 168 hours.  Recent Results (from the past 240 hour(s))  SARS Coronavirus 2 by RT PCR (hospital order, performed in [facility] hospital lab) Nasopharyngeal Nasopharyngeal Swab     Status: None   Collection Time: 11/28/19  9:52 PM   Specimen: Nasopharyngeal Swab  Result Value Ref Range Status   SARS Coronavirus 2 NEGATIVE NEGATIVE Final    Comment: (NOTE) SARS-CoV-2 target nucleic acids are NOT DETECTED.  The SARS-CoV-2 RNA is generally detectable in upper and lower respiratory specimens during the acute phase of infection. The lowest concentration of SARS-CoV-2 viral copies this assay can detect is 250 copies / mL. A negative result does not  preclude SARS-CoV-2 infection and should not be used as the sole basis for treatment or other patient management decisions.  A negative result may occur with improper specimen collection / handling, submission of specimen other than nasopharyngeal swab, presence of viral mutation(s) within the areas targeted by this assay, and inadequate number of viral copies (<250 copies / mL). A negative result must be combined with clinical observations, patient history, and epidemiological information.  Fact Sheet for Patients:   StrictlyIdeas.no  Fact Sheet for Healthcare Providers: BankingDealers.co.za  This test is not yet approved or  cleared by the [place] FDA and has been authorized for detection and/or diagnosis of SARS-CoV-2 by FDA under an Emergency Use Authorization (EUA).  This EUA will remain in effect (meaning this test can be used) for the duration of the COVID-19 declaration under Section 564(b)(1) of the Act, 21 U.S.C. section 360bbb-3(b)(1), unless the authorization is terminated or revoked sooner.  Performed at [facility], [address], [place], [place] [address]      Radiology Studies: CARDIAC CATHETERIZATION  Result Date: 11/29/2019 See op note  [place] Lower Ext Art Right Ltd  Result Date: 11/28/2019 CLINICAL DATA:  79 year old female status post catheterization in the right groin. Concern for pseudoaneurysm. EXAM: Right LOWER EXTREMITY ARTERIAL DUPLEX SCAN TECHNIQUE: Gray-scale sonography as well as color Doppler and duplex ultrasound was performed to evaluate the arteries of the right groin. COMPARISON:  None. FINDINGS: There is a partially thrombosed pseudoaneurysm in the right groin measuring 9.7 x 3.0 x 4.3 cm. The non thrombosed sac of the pseudoaneurysm measures 3.2 x 2.4 x 3.2 cm. The neck of the pseudoaneurysm measures approximately 1.5 cm in length and 2 mm in diameter. IMPRESSION: Partially thrombosed  pseudoaneurysm in the right groin. These results were called by telephone at the time of interpretation on 11/28/2019 at 6:11 pm to provider Lavonia Drafts , who verbally acknowledged these results. Electronically Signed   By: Anner Crete M.D.   On: 11/28/2019 18:12    Scheduled Meds: . amLODipine  5 mg Oral Daily  . atorvastatin  40 mg Oral QHS  . calcium-vitamin D  1 tablet Oral Q breakfast  . ferrous sulfate  325 mg Oral Daily  . levothyroxine  100 mcg Oral QAC breakfast  . loratadine  10 mg Oral Daily  . metoprolol succinate  25 mg Oral Daily  . multivitamin with minerals   Oral Daily  . Nintedanib  150 mg Oral Q12H  . pantoprazole  40 mg Oral Daily  . thrombin 5,000 units for percutaneous treatment of pseudoaneurysm  5,000 Units Percutaneous Once   Continuous Infusions:    LOS: 2 days   Time spent: 40 minutes.  Lorella Nimrod, MD [facility]  If 7PM-7AM, please contact night-coverage [email or web address]  11/30/2019, 1:45 PM   This record has been created using Systems analyst. Errors have been sought and corrected,but may not always be located. Such creation errors do not reflect on the standard of care.

## 2019-11-30 NOTE — Progress Notes (Signed)
[facility] Daily Progress Note   Subjective: 11/29/19: 1.   Thrombin injection right femoral artery pseudoaneurysm with ultrasound guidance.  Patient with continued right thigh pain. States unstable when ambulation.   Objective: Vitals:   11/29/19 1630 11/29/19 1652 11/29/19 2344 11/30/19 0809  BP: 113/64 122/69 124/71 (!) 151/84  Pulse: 70 64 70 81  Resp: (!) _0 Temp:  97.8 F (36.6 C) 98.1 F (36.7 C) 97.6 F (36.4 C)  TempSrc:  Oral Oral Oral  SpO2: 98% 100% 100% 100%  Weight:      Height:        Intake/Output Summary (Last 24 hours) at 11/30/2019 1230 Last data filed at 11/29/2019 1500 Gross per 24 hour  Intake 1048.99 ml  Output --  Net 1048.99 ml   Physical Exam: A&Ox3, NAD CV: RRR Pulmonary: CTA Bilaterally Abdomen: Soft, Nontender, Nondistended Right Groin:  Dressing clean, dry and intact Vascular:   Right Lower Extremity: Thigh soft, calf soft. Extremities is warm distally to toes, motor / sensory intact   Laboratory: CBC    Component Value Date/Time   WBC 5.3 11/30/2019 0549   HGB 10.0 (L) 11/30/2019 0549   HGB 11.5 11/15/2019 1235   HCT 30.8 (L) 11/30/2019 0549   HCT 35.1 11/15/2019 1235   PLT 157 11/30/2019 0549   PLT 160 11/15/2019 1235   BMET    Component Value Date/Time   NA 138 11/29/2019 0445   NA 141 11/15/2019 1235   NA 138 12/18/2013 1504   K 4.4 11/29/2019 0445   K 3.4 (L) 12/18/2013 1504   CL 105 11/29/2019 0445   CL 103 12/18/2013 1504   CO2 27 11/29/2019 0445   CO2 24 12/18/2013 1504   GLUCOSE 97 11/29/2019 0445   GLUCOSE 125 (H) 12/18/2013 1504   BUN 13 11/29/2019 0445   BUN 14 11/15/2019 1235   BUN 17 12/18/2013 1504   CREATININE 0.70 11/29/2019 0445   CREATININE 0.88 12/18/2013 1504   CALCIUM 8.6 (L) 11/29/2019 0445   CALCIUM 8.5 12/18/2013 1504   GFRNONAA >60 11/29/2019 0445   GFRNONAA >60 12/18/2013 1504   GFRAA >60 11/29/2019 0445   GFRAA >60 12/18/2013 1504    Assessment/Planning: The patient is a 79 y.o. female who presents with a symptomatic pseudoaneurysm of the right femoral artery after recent procedure POD#1  1) Successful thrombin injection to pseudoaneursym 2) Patient concerned about going home as she feels is can not ambulate safely - PT / OT 3) OOB to chair  Discussed with Dr. Ellis Parents Sanad Fearnow PA-C 11/30/2019 12:30 PM

## 2019-12-01 NOTE — NC FL2 (Signed)
[place] LEVEL OF CARE SCREENING TOOL     IDENTIFICATION  Patient Name: Tina Calderon Birthdate: 12/28/1940 Sex: female Admission Date (Current Location): 11/28/2019  [place] and [place] Number:  Engineering geologist and Address:  [facility], [address] [place], [place] [address]      Provider Number: [id number]  Attending Physician Name and Address:  Lorella Nimrod, MD  Relative Name and Phone Number:  Santiago Glad [phone]    Current Level of Care: Hospital Recommended Level of Care: [place] Prior Approval Number:    Date Approved/Denied:   PASRR Number: [id number]  Discharge Plan: SNF    Current Diagnoses: Patient Active Problem List   Diagnosis Date Noted  . Rt groin pain   . Pseudoaneurysm ([place]) 11/28/2019  . Pulmonary arterial hypertension ([place])   . Genetic testing 07/06/2019  . History of CVA (cerebrovascular accident) 05/18/2019  . Heart palpitations 05/18/2019  . Shortness of breath 05/18/2019  . Family history of pulmonary fibrosis   . Therapeutic drug monitoring 03/22/2019  . Aortic atherosclerosis ([place]) 03/04/2019  . Chronic respiratory failure with hypoxia ([place]) 02/23/2019  . Pulmonary HTN ([place]) 02/23/2019  . Vertigo 10/12/2018  . Multiple thyroid nodules 07/11/2018  . 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 CVA (cerebral vascular accident) ([place]) 04/18/2018  . Gait abnormality   . Chronic diastolic congestive heart failure ([place])   . Acute blood loss anemia   . Anemia of chronic disease   . Lower extremity weakness 01/31/2018  . Thyroid nodule 01/31/2018  . Tachycardia 01/19/2018  . Prediabetes 12/18/2017  . Preventative health care 12/18/2017  . GERD (gastroesophageal reflux disease) 05/12/2017  . Essential hypertension 09/25/2016  . Cough variant asthma 06/13/2016  . Allergic rhinitis 01/29/2016  . Mild intermittent asthma 01/29/2016  . Cough 01/03/2015  .  Chronic fatigue 07/15/2014  . Research study patient 06/28/2014  . IPF (idiopathic pulmonary fibrosis) / IPAF 02/04/2012  . Orthostatic hypotension 01/24/2012  . Normocytic anemia 01/22/2012  . Dyslipidemia, goal LDL below 70 01/22/2012  . Anemia 01/20/2012  . Hypothyroidism   . Pelvic relaxation 11/11/2011    Orientation RESPIRATION BLADDER Height & Weight     Self, Time, Situation, Place  Normal External catheter Weight: 59.4 kg Height:  _0  (157.5 cm)  BEHAVIORAL SYMPTOMS/MOOD NEUROLOGICAL BOWEL NUTRITION STATUS      Continent Diet (heart healthy)  AMBULATORY STATUS COMMUNICATION OF NEEDS Skin   Extensive Assist Verbally Surgical wounds, Bruising                       Personal Care Assistance Level of Assistance  Bathing, Dressing Bathing Assistance: Limited assistance   Dressing Assistance: Limited assistance     Functional Limitations Info             SPECIAL CARE FACTORS FREQUENCY  PT (By licensed PT), OT (By licensed OT)     PT Frequency: 5 times per week OT Frequency: 5 times per week            Contractures Contractures Info: Not present    Additional Factors Info  Code Status, Allergies Code Status Info: full code Allergies Info: Dilaudid, Crestor           Current Medications (12/01/2019):  This is the current hospital active medication list Current Facility-Administered Medications  Medication Dose Route Frequency Provider Last Rate Last Admin  . acetaminophen (TYLENOL) tablet 650 mg  650 mg Oral  Q6H PRN Algernon Huxley, MD   650 mg at 12/01/19 3500   Or  . acetaminophen (TYLENOL) suppository 650 mg  650 mg Rectal Q6H PRN Algernon Huxley, MD      . amLODipine (NORVASC) tablet 5 mg  5 mg Oral Daily Algernon Huxley, MD   5 mg at 12/01/19 0949  . atorvastatin (LIPITOR) tablet 40 mg  40 mg Oral QHS Algernon Huxley, MD   40 mg at 11/30/19 2017  . calcium-vitamin D (OSCAL WITH D) 500-200 MG-UNIT per tablet 1 tablet  1 tablet Oral Q breakfast Lucky Cowboy Erskine Squibb, MD   1 tablet at 12/01/19 0947  . clopidogrel (PLAVIX) tablet 75 mg  75 mg Oral Daily Stegmayer, Kimberly A, PA-C   75 mg at 12/01/19 0949  . ferrous sulfate tablet 325 mg  325 mg Oral Daily Algernon Huxley, MD   325 mg at 12/01/19 0948  . ketorolac (TORADOL) 15 MG/ML injection 15 mg  15 mg Intravenous Q6H PRN Lorella Nimrod, MD   15 mg at 12/01/19 0945  . levothyroxine (SYNTHROID) tablet 100 mcg  100 mcg Oral QAC breakfast Algernon Huxley, MD   100 mcg at 12/01/19 9381  . loratadine (CLARITIN) tablet 10 mg  10 mg Oral Daily Algernon Huxley, MD   10 mg at 12/01/19 0948  . magnesium hydroxide (MILK OF MAGNESIA) suspension 30 mL  30 mL Oral Daily PRN Algernon Huxley, MD   30 mL at 11/30/19 1549  . metoprolol succinate (TOPROL-XL) 24 hr tablet 25 mg  25 mg Oral Daily Algernon Huxley, MD   25 mg at 12/01/19 0949  . morphine 2 MG/ML injection 2 mg  2 mg Intravenous Q4H PRN Algernon Huxley, MD   2 mg at 11/30/19 1549  . multivitamin with minerals tablet   Oral Daily Algernon Huxley, MD   1 tablet at 12/01/19 0949  . Nintedanib (OFEV) CAPS 150 mg  150 mg Oral Q12H Dew, Erskine Squibb, MD      . ondansetron [facility]) tablet 4 mg  4 mg Oral Q6H PRN Algernon Huxley, MD       Or  . ondansetron (ZOFRAN) injection 4 mg  4 mg Intravenous Q6H PRN Algernon Huxley, MD      . pantoprazole (PROTONIX) EC tablet 40 mg  40 mg Oral Daily Algernon Huxley, MD   40 mg at 12/01/19 0948  . thrombin 5,000 units for percutaneous treatment of pseudoaneurysm  5,000 Units Percutaneous Once Algernon Huxley, MD      . traMADol Veatrice [place]bon) tablet 50 mg  50 mg Oral Q12H PRN Lorella Nimrod, MD   50 mg at 12/01/19 1108  . traZODone (DESYREL) tablet 25 mg  25 mg Oral QHS PRN Algernon Huxley, MD         Discharge Medications: Please see discharge summary for a list of discharge medications.  Relevant Imaging Results:  Relevant Lab Results:   Additional Information SS#: [id number]  Su Hilt, RN

## 2019-12-01 NOTE — Plan of Care (Signed)

## 2019-12-01 NOTE — Evaluation (Signed)
Occupational Therapy Evaluation Patient Details Name: Tina Calderon MRN: [id number] DOB: January 24, 1941 Today's Date: 12/01/2019    History of Present Illness Patient is a 79 y.o. female who presents with a symptomatic pseudoaneurysm of the right femoral artery after recent cardiac catheterization. s/p Thrombin injection right femoral artery pseudoaneurysm with ultrasound guidance on 6/21. Past medical history of  CHF, hypertension, hypothyroidism, interstitial lung disease with oxygen at baseline.    Clinical Impression   Ms. Murtagh was seen for OT evaluation this date. Prior to hospital admission, pt was active and modified independent. She endorses using a 4WW for community distances and caring for her spouse who requires assist with IADL management. She reports enjoying shopping at the outlet mall with her daughter. Pt lives in a 1-level apartment home with her spouse. She reports her son and daughter live nearby and assist as needed. Pt chronically on 2/5-3L of supplemental O2 at baseline. Currently pt demonstrates impairments as described below (See OT problem list) which functionally limit her ability to perform ADL/self-care tasks. Her primary functional limitation being increased RLE pain and decreased AROM. She requires MOD A for fxl mobility, LBD, & bathing in seated position, set-up assist for UB tsks including dressing, grooming, and self-feeding  Pt would benefit from skilled OT to address noted impairments and functional limitations (see below for any additional details) in order to maximize safety and independence while minimizing falls risk and caregiver burden. Upon hospital discharge, recommend STR to maximize pt safety and return to PLOF.      Follow Up Recommendations  SNF    Equipment Recommendations  3 in 1 bedside commode    Recommendations for Other Services       Precautions / Restrictions Precautions Precautions: Fall Restrictions Weight Bearing Restrictions: No       Mobility Bed Mobility Overal bed mobility: Needs Assistance Bed Mobility: Supine to Sit;Sit to Supine           General bed mobility comments: Deferred. Pt declines all fxl mobility 2/2 significant increased pain.  Transfers                      Balance Overall balance assessment: Needs assistance Sitting-balance support: Feet supported;Bilateral upper extremity supported Sitting balance-Leahy Scale: Fair                                     ADL either performed or assessed with clinical judgement   ADL Overall ADL's : Needs assistance/impaired                                       General ADL Comments: Pt significantly functionally limited by decreased AROM of her RLE and increased pain with  movement. MOD A for fxl mobility, LBD, & bathing in seated position, set-up assist for UB tsks including dressing, grooming, and self-feeding.     Vision Baseline Vision/History: Wears glasses Wears Glasses: At all times Patient Visual Report: No change from baseline       Perception     Praxis      Pertinent Vitals/Pain Pain Assessment: 0-10 Pain Score: 7  Pain Location: right groin  Pain Descriptors / Indicators: Operative site guarding;Guarding;Crying Pain Intervention(s): Limited activity within patient's tolerance;Monitored during session;Repositioned;Utilized relaxation techniques     Hand Dominance Right   Extremity/Trunk Assessment  Upper Extremity Assessment Upper Extremity Assessment: Overall WFL for tasks assessed   Lower Extremity Assessment Lower Extremity Assessment: RLE deficits/detail RLE Deficits / Details: Pt reporst significant decreased aROM of her RLE, able to perform dorsi/plantarflexion. Notably decreased hip flexion as compared to left. Pt denies sensation loss. RLE: Unable to fully assess due to pain RLE Sensation: WNL RLE Coordination: decreased gross motor       Communication  Communication Communication: No difficulties   Cognition Arousal/Alertness: Awake/alert Behavior During Therapy: WFL for tasks assessed/performed Overall Cognitive Status: Within Functional Limits for tasks assessed                                     General Comments  Pt noted with large bruise across her R inner thigh as well as across L dorsal forearm.    Exercises Other Exercises Other Exercises: Pt educated in role of OT in acute setting, falls prevention strategies for home and hospital, & importance of activity during hospitalization. Pt educated on basic bed-level exercises including heel slides, ankle pumps, bicep curls, and serratus punches.   Shoulder Instructions      Home Living Family/patient expects to be discharged to:: Private residence Living Arrangements: Spouse/significant other (Spouse, who cannot assist.) Available Help at Discharge: Family;Available PRN/intermittently (Son/dtr live nearby.) Type of Home: Apartment Home Access: Level entry           Bathroom Shower/Tub: Occupational psychologist: Standard (comfort height) Bathroom Accessibility: Yes How Accessible: Accessible via walker Home Equipment: Walker - 4 wheels;Shower seat;Grab bars - tub/shower;Grab bars - toilet   Additional Comments: patient is the caregiver for her spouse       Prior Functioning/Environment Level of Independence: Independent with assistive device(s)        Comments: uses 3 L 02 at night and 2.5-3 L oxygen during the day as needed. ocasional use of rollator for community ambulation. +driving        OT Problem List: Decreased strength;Pain;Decreased coordination;Decreased range of motion;Decreased activity tolerance;Decreased safety awareness;Impaired balance (sitting and/or standing);Decreased knowledge of use of DME or AE      OT Treatment/Interventions: Self-care/ADL training;Therapeutic exercise;Therapeutic activities;Energy conservation;DME  and/or AE instruction;Patient/family education;Balance training    OT Goals(Current goals can be found in the care plan section) Acute Rehab OT Goals Patient Stated Goal:  to be able to walk and move right leg  OT Goal Formulation: With patient Time For Goal Achievement: 12/15/19 Potential to Achieve Goals: Good ADL Goals Pt Will Perform Grooming: standing;with set-up;with supervision;with adaptive equipment (c LRAD PRN for safety/functional independence.) Pt Will Perform Lower Body Dressing: sit to/from stand;with set-up;with supervision;with adaptive equipment (c LRAD PRN for safety/functional independence.) Pt Will Transfer to Toilet: bedside commode;with set-up;with supervision;ambulating (c LRAD PRN for safety/functional independence.) Pt Will Perform Toileting - Clothing Manipulation and hygiene: sit to/from stand;with modified independence;with adaptive equipment (c LRAD PRN for safety/functional independence.)  OT Frequency: Min 2X/week   Barriers to D/C: Decreased caregiver support          Co-evaluation              AM-PAC OT "6 Clicks" Daily Activity     Outcome Measure Help from another person eating meals?: A Little Help from another person taking care of personal grooming?: A Little Help from another person toileting, which includes using toliet, bedpan, or urinal?: A Lot Help from another person bathing (including washing,  rinsing, drying)?: A Lot Help from another person to put on and taking off regular upper body clothing?: A Little Help from another person to put on and taking off regular lower body clothing?: A Lot 6 Click Score: 15   End of Session Nurse Communication: Patient requests pain meds  Activity Tolerance: Patient tolerated treatment well Patient left: in bed;with call bell/phone within reach;with bed alarm set;with SCD's reapplied  OT Visit Diagnosis: Other abnormalities of gait and mobility (R26.89);Pain Pain - Right/Left: Right Pain - part  of body: Hip;Knee                Time: 1281-1886 OT Time Calculation (min): 28 min Charges:  OT General Charges $OT Visit: 1 Visit OT Evaluation $OT Eval Moderate Complexity: 1 Mod OT Treatments $Self Care/Home Management : 8-22 mins  Shara Blazing, M.S., OTR/L Ascom: [phone] 12/01/19, 10:18 AM

## 2019-12-01 NOTE — TOC Progression Note (Signed)
Transition of Care [facility]) - Progression Note    Patient Details  Name: Tina Calderon MRN: [id number] Date of Birth: Dec 07, 1940  Transition of Care [facility]) CM/SW [place], RN Phone Number: 12/01/2019, 2:26 PM  Clinical Narrative:    Manuela Neptune and bedsearch completed, will review offers once obtained        Expected Discharge Plan and Services                                                 Social Determinants of Health (SDOH) Interventions    Readmission Risk Interventions No flowsheet data found.

## 2019-12-01 NOTE — Progress Notes (Signed)
PROGRESS NOTE    Tina Calderon  MVV:[id number] DOB: 17-Mar-1941 DOA: 11/28/2019 PCP: [name], NP   Brief Narrative:  Tina Calderon  is a 79 y.o. Caucasian female with a known history of CHF, hypertension, hypothyroidism, interstitial lung disease who presented to the emergency room with acute onset of right thigh swelling and pain with enlarging ecchymosis after having cardiac catheterization about 11 days ago to determine if the patient is a candidate for therapy for pulmonary fibrosis. Ultrasound with partially thrombosed pseudoaneurysm in the right groin.  Vascular surgery was consulted.  Subjective: Continues to have some right groin pain, although able to help with PT.  She was able to do some movements around right hip today.  She was agreeable to go to SNF before going home as her husband is disable.  Assessment & Plan:   Active Problems:   Pseudoaneurysm (HCC)   Rt groin pain  Right groin pseudoaneurysm.  Patient underwent cardiac catheterization on November 19, 2019.  He started experiencing worsening groin pain on 11/28/2019.  Ultrasound with a large pseudoaneurysm, with partially thrombosed.  Vascular surgery was consulted s/p thrombin injection. Continue right groin pain. -Pain management. -PT/OT evaluation for disposition, home health/SNF.  Patient agrees to go to [facility].  TOC is looking into her options.  Essential hypertension. -Continue home dose of Norvasc and Toprol-XL.  Dyslipidemia. -Continue statin. -Holding home dose of Plavix for procedure now.  Prediabetes.  Currently diet controlled.  Hypothyroidism. -Continue home dose of Synthroid.  Objective: Vitals:   11/30/19 1519 11/30/19 2317 12/01/19 0741 12/01/19 1425  BP: (!) 122/52 (!) 123/58 (!) 144/79 113/61  Pulse: 74 72 67 64  Resp: _0 Temp: 98 F (36.7 C) 98 F (36.7 C) 97.6 F (36.4 C) 97.7 F (36.5 C)  TempSrc: Oral Oral Oral Oral  SpO2: 100% 100% 100% 100%   Weight:      Height:        Intake/Output Summary (Last 24 hours) at 12/01/2019 1633 Last data filed at 12/01/2019 1031 Gross per 24 hour  Intake 120 ml  Output --  Net 120 ml   Filed Weights   11/28/19 1709  Weight: 59.4 kg    Examination:  General exam: Appears calm and comfortable  Respiratory system: Clear to auscultation. Respiratory effort normal. Cardiovascular system: S1 & S2 heard, RRR. No JVD, murmurs, rubs, gallops or clicks. Gastrointestinal system: Soft, nontender, nondistended, bowel sounds positive. Central nervous system: Alert and oriented. No focal neurological deficits. Extremities: No LE edema, no cyanosis, pulses intact and symmetrical.  Right groin tenderness with some edema and ecchymosis. Psychiatry: Judgement and insight appear normal.   DVT prophylaxis: SCDs. Code Status: Full Family Communication: Discussed with patient Disposition Plan:  Status is: Inpatient  Remains inpatient appropriate because:Inpatient level of care appropriate due to severity of illness   Dispo: The patient is from: Home              Anticipated d/c is to: Home              Anticipated d/c date is: 1 day.              Patient currently is not medically stable to d/c.  Patient continued to have uncontrolled pain.  PT is recommending SNF placement.  Patient is agreeable now as she has disabled husband at home who cannot take care of her.  TOC is looking for place.  [facility] to [facility] good [facility].  Consultants:   Vascular surgery  Procedures:  Antimicrobials:   Data Reviewed: I have personally reviewed following labs and imaging studies  CBC: Recent Labs  Lab 11/29/19 0445 11/30/19 0549  WBC 5.3 5.3  HGB 10.6* 10.0*  HCT 31.8* 30.8*  MCV 92.7 95.1  PLT 142* 010   Basic Metabolic Panel: Recent Labs  Lab 11/29/19 0445  NA 138  K 4.4  CL 105  CO2 27  GLUCOSE 97  BUN 13  CREATININE 0.70  CALCIUM 8.6*    GFR: Estimated Creatinine Clearance: 45.1 mL/min (by C-G formula based on SCr of 0.7 mg/dL). Liver Function Tests: No results for input(s): AST, ALT, ALKPHOS, BILITOT, PROT, ALBUMIN in the last 168 hours. No results for input(s): LIPASE, AMYLASE in the last 168 hours. No results for input(s): AMMONIA in the last 168 hours. Coagulation Profile: No results for input(s): INR, PROTIME in the last 168 hours. Cardiac Enzymes: No results for input(s): CKTOTAL, CKMB, CKMBINDEX, TROPONINI in the last 168 hours. BNP (last 3 results) No results for input(s): PROBNP in the last 8760 hours. HbA1C: No results for input(s): HGBA1C in the last 72 hours. CBG: No results for input(s): GLUCAP in the last 168 hours. Lipid Profile: No results for input(s): CHOL, HDL, LDLCALC, TRIG, CHOLHDL, LDLDIRECT in the last 72 hours. Thyroid Function Tests: No results for input(s): TSH, T4TOTAL, FREET4, T3FREE, THYROIDAB in the last 72 hours. Anemia Panel: No results for input(s): VITAMINB12, FOLATE, FERRITIN, TIBC, IRON, RETICCTPCT in the last 72 hours. Sepsis Labs: No results for input(s): PROCALCITON, LATICACIDVEN in the last 168 hours.  Recent Results (from the past 240 hour(s))  SARS Coronavirus 2 by RT PCR (hospital order, performed in [facility] hospital lab) Nasopharyngeal Nasopharyngeal Swab     Status: None   Collection Time: 11/28/19  9:52 PM   Specimen: Nasopharyngeal Swab  Result Value Ref Range Status   SARS Coronavirus 2 NEGATIVE NEGATIVE Final    Comment: (NOTE) SARS-CoV-2 target nucleic acids are NOT DETECTED.  The SARS-CoV-2 RNA is generally detectable in upper and lower respiratory specimens during the acute phase of infection. The lowest concentration of SARS-CoV-2 viral copies this assay can detect is 250 copies / mL. A negative result does not preclude SARS-CoV-2 infection and should not be used as the sole basis for treatment or other patient management decisions.  A negative result  may occur with improper specimen collection / handling, submission of specimen other than nasopharyngeal swab, presence of viral mutation(s) within the areas targeted by this assay, and inadequate number of viral copies (<250 copies / mL). A negative result must be combined with clinical observations, patient history, and epidemiological information.  Fact Sheet for Patients:   StrictlyIdeas.no  Fact Sheet for Healthcare Providers: BankingDealers.co.za  This test is not yet approved or  cleared by the [place] FDA and has been authorized for detection and/or diagnosis of SARS-CoV-2 by FDA under an Emergency Use Authorization (EUA).  This EUA will remain in effect (meaning this test can be used) for the duration of the COVID-19 declaration under Section 564(b)(1) of the Act, 21 U.S.C. section 360bbb-3(b)(1), unless the authorization is terminated or revoked sooner.  Performed at [facility], [address], [place], [place] [address]      Radiology Studies: No results found.  Scheduled Meds: . amLODipine  5 mg Oral Daily  . atorvastatin  40 mg Oral QHS  . calcium-vitamin D  1 tablet Oral Q  breakfast  . clopidogrel  75 mg Oral Daily  . ferrous sulfate  325 mg Oral Daily  . levothyroxine  100 mcg Oral QAC breakfast  . loratadine  10 mg Oral Daily  . metoprolol succinate  25 mg Oral Daily  . multivitamin with minerals   Oral Daily  . Nintedanib  150 mg Oral Q12H  . pantoprazole  40 mg Oral Daily  . thrombin 5,000 units for percutaneous treatment of pseudoaneurysm  5,000 Units Percutaneous Once   Continuous Infusions:    LOS: 3 days   Time spent: 35 minutes.  Lorella Nimrod, MD [facility]  If 7PM-7AM, please contact night-coverage [email or web address]  12/01/2019, 4:33 PM   This record has been created using Systems analyst. Errors have been sought and corrected,but may not always be  located. Such creation errors do not reflect on the standard of care.

## 2019-12-01 NOTE — Plan of Care (Signed)
  Problem: Education: Goal: Knowledge of General Education information will improve Description: Including pain rating scale, medication(s)/side effects and non-pharmacologic comfort measures Outcome: Progressing   Problem: Health Behavior/Discharge Planning: Goal: Ability to manage health-related needs will improve Outcome: Progressing

## 2019-12-02 LAB — CBC
HCT: 33.8 % — ABNORMAL LOW (ref 36.0–46.0)
Hemoglobin: 11.1 g/dL — ABNORMAL LOW (ref 12.0–15.0)
MCH: 30.7 pg (ref 26.0–34.0)
MCHC: 32.8 g/dL (ref 30.0–36.0)
MCV: 93.4 fL (ref 80.0–100.0)
Platelets: 157 10*3/uL (ref 150–400)
RBC: 3.62 MIL/uL — ABNORMAL LOW (ref 3.87–5.11)
RDW: 13.4 % (ref 11.5–15.5)
WBC: 7 10*3/uL (ref 4.0–10.5)
nRBC: 0 % (ref 0.0–0.2)

## 2019-12-02 LAB — SARS CORONAVIRUS 2 BY RT PCR (HOSPITAL ORDER, PERFORMED IN ~~LOC~~ HOSPITAL LAB): SARS Coronavirus 2: NEGATIVE

## 2019-12-02 MED ORDER — TRAMADOL HCL 50 MG PO TABS: 50.0000 mg | ORAL_TABLET | Freq: Two times a day (BID) | ORAL | 0 refills | Status: DC | PRN

## 2019-12-02 MED ORDER — TRAZODONE HCL 50 MG PO TABS: 25.0000 mg | ORAL_TABLET | Freq: Every evening | ORAL | Status: DC | PRN

## 2019-12-02 NOTE — TOC Progression Note (Signed)
Transition of Care [facility]) - Progression Note    Patient Details  Name: Tina Calderon MRN: [id number] Date of Birth: 1940/12/03  Transition of Care [facility]) CM/SW [place], RN Phone Number: 12/02/2019, 9:48 AM  Clinical Narrative:    Met with the patient to review bed offers, she chose a bed offer from [facility] due to a prior good experience there, I called [place] to obtain insurance auth and get a rapid review, ref number [id number] Faxed clinical to [phone] Approved for start date next review 28th  Case manager Horace Porteous        Expected Discharge Plan and Services                                                 Social Determinants of Health (SDOH) Interventions    Readmission Risk Interventions No flowsheet data found.

## 2019-12-02 NOTE — Progress Notes (Signed)
Pt d/c to [facility] this afternoon via EMS all belongings taken at time of d/c.  IV removed by NT+3 without complications.  No c/o pain at time of d/c.  VS WNL.  This nurse attempted to call report to receiving nurse but was unable to get through to nurse via phone.

## 2019-12-02 NOTE — Care Management Important Message (Signed)
Important Message  Patient Details  Name: Tina Calderon MRN: [id number] Date of Birth: 05/07/1941   Medicare Important Message Given:  Yes     Juliann Pulse A Allison Deshotels 12/02/2019, 10:57 AM

## 2019-12-02 NOTE — TOC Progression Note (Addendum)
Transition of Care [facility]) - Progression Note    Patient Details  Name: Tina Calderon MRN: [id number] Date of Birth: 1941-03-05  Transition of Care [facility]) CM/SW [place], RN Phone Number: 12/02/2019, 1:48 PM  Clinical Narrative:    The bedside nurse called report and RNCM called [facility] for transport, they will arrive at 330 to pick up the patient to go [facility] Notified Santiago Glad the patient's daughter of the DC       Expected Discharge Plan and Services           Expected Discharge Date: 12/02/19                                     Social Determinants of Health (SDOH) Interventions    Readmission Risk Interventions No flowsheet data found.

## 2019-12-02 NOTE — Discharge Summary (Addendum)
Physician Discharge Summary  Tina Calderon RJJ:[id number] DOB: 13-May-1941 DOA: 11/28/2019  PCP: [name], NP  Admit date: 11/28/2019 Discharge date: 12/02/2019  Admitted From: Home Disposition: SNF  Recommendations for Outpatient Follow-up:  1. Follow up with PCP in 1-2 weeks 2. Follow-up with vascular surgery 3. Please obtain BMP/CBC in one week 4. Please follow up on the following pending results: None  Home Health: No Equipment/Devices:, Home oxygen, rolling walker Discharge Condition: Stable CODE STATUS: Full Diet recommendation: Heart Healthy/carb modified.  Brief/Interim Summary: NancyWimbishis a78 y.o.Caucasian femalewith a known history of CHF, hypertension, hypothyroidism, interstitial lung disease who presented to the emergency room with acute onset of right thigh swelling and pain with enlarging ecchymosis after having cardiac catheterization about 11 days ago to determine if the patient is a candidate for therapy for pulmonary fibrosis. Ultrasound with partially thrombosed pseudoaneurysm in the right groin.  Vascular surgery was consulted and she was injected with thrombin for pseudoaneurysm repair.  Patient continued to experience 7-8 out of 10 right groin pain.  She was offered morphine and Dilaudid.  She was becoming too dizzy with IV medications so was trying to avoid it.  Per patient she will try her best to avoid IV medication and see if minimum doses of tramadol can work .  Per patient IV Dilaudid did help with the pain but those medications makes her very loopy and she does not like that feeling.  She was unable to ambulate due to pain.  She lives with her disabled husband.  I work with PT evaluated him and recommended going to a rehab before going home.  Patient is being discharged to a rehab facility.  She will continue with her home medications and follow-up with her primary care physician.  Discharge Diagnoses:  Active Problems:    Pseudoaneurysm (HCC)   Rt groin pain   Discharge Instructions  Discharge Instructions    Diet - low sodium heart healthy   Complete by: As directed    Increase activity slowly   Complete by: As directed      Allergies as of 12/02/2019      Reactions   Crestor [rosuvastatin Calcium] Other (See Comments)   Myalgias, muscle weakness   Dilaudid [hydromorphone] Nausea And Vomiting      Medication List    TAKE these medications   acetaminophen 500 MG tablet Commonly known as: TYLENOL Take 1,000 mg by mouth every 6 (six) hours as needed for mild pain or headache.   amLODipine 5 MG tablet Commonly known as: NORVASC TAKE 1 TABLET BY MOUTH EVERY DAY FOR BLOOD PRESSURE What changed: See the new instructions.   atorvastatin 40 MG tablet Commonly known as: LIPITOR TAKE 1 TABLET (40 MG TOTAL) BY MOUTH AT BEDTIME. FOR CHOLESTEROL.   CALCIUM CARBONATE-VITAMIN D PO Take 1 tablet by mouth daily.   CENTRUM SILVER PO Take 1 tablet by mouth daily.   clopidogrel 75 MG tablet Commonly known as: PLAVIX TAKE 1 TABLET (75 MG TOTAL) BY MOUTH DAILY. FOR STROKE PREVENTION.   CRANBERRY PO Take 1 capsule by mouth every Monday, Wednesday, and Friday.   ferrous sulfate 325 (65 FE) MG tablet Take 325 mg by mouth every evening.   levothyroxine 100 MCG tablet Commonly known as: SYNTHROID TAKE 1 TABLET BY MOUTH EVERY MORNING ON AN EMPTY STOMACH. NO FOOD OR OTHER MEDICATIONS FOR 30 MIN What changed: See the new instructions.   loratadine 10 MG tablet Commonly known as: CLARITIN Take 10 mg by mouth daily.  metoprolol succinate 25 MG 24 hr tablet Commonly known as: TOPROL-XL Take 1 tablet (25 mg total) by mouth daily.   Ofev 150 MG Caps Generic drug: Nintedanib Take 150 mg by mouth every 12 (twelve) hours. As Directed   omeprazole 20 MG capsule Commonly known as: PRILOSEC TAKE 1 CAPSULE BY MOUTH EVERY DAY What changed: how much to take   traMADol 50 MG tablet Commonly known as:  ULTRAM Take 1 tablet (50 mg total) by mouth every 12 (twelve) hours as needed for moderate pain or severe pain.   traZODone 50 MG tablet Commonly known as: DESYREL Take 0.5 tablets (25 mg total) by mouth at bedtime as needed for sleep.       Contact information for follow-up providers    Dew, Erskine Squibb, MD Follow up in 1 week(s).   Specialties: Vascular Surgery, Radiology, Interventional Cardiology Why: Can see Dew or Arna Medici. Will need right lower extremity arterial duplex.  Contact information: [place] [address] [phone]        [name], NP. Schedule an appointment as soon as possible for a visit.   Specialty: Internal Medicine Contact information: [place] [address] [phone]        Buford Dresser, MD .   Specialty: Cardiology Contact information: [address] [place] [address] [phone]            Contact information for after-discharge care    [facility] .   Service: Skilled Nursing Contact information: [place] [phone]                 Allergies  Allergen Reactions  . Crestor [Rosuvastatin Calcium] Other (See Comments)    Myalgias, muscle weakness  . Dilaudid [Hydromorphone] Nausea And Vomiting    Consultations:  Vascular surgery  Procedures/Studies: CARDIAC CATHETERIZATION  Result Date: 11/29/2019 See op note  CARDIAC CATHETERIZATION  Result Date: 11/19/2019 Elevated right heart pressures with significantly increased transpulmonic gradient with severe pulmonary arterial hypertension with PA pressure 68/31 and mean pressure 46 mmHg. PVR: 5.9 WU (Fick) and 7.1 WU (Thermo) RECOMMENDATION: The patient will follow-up with Dr. [name] and Dr. Harrell Gave.  [place] Lower Ext Art Right Ltd  Result Date: 11/28/2019 CLINICAL DATA:  79 year old female status post  catheterization in the right groin. Concern for pseudoaneurysm. EXAM: Right LOWER EXTREMITY ARTERIAL DUPLEX SCAN TECHNIQUE: Gray-scale sonography as well as color Doppler and duplex ultrasound was performed to evaluate the arteries of the right groin. COMPARISON:  None. FINDINGS: There is a partially thrombosed pseudoaneurysm in the right groin measuring 9.7 x 3.0 x 4.3 cm. The non thrombosed sac of the pseudoaneurysm measures 3.2 x 2.4 x 3.2 cm. The neck of the pseudoaneurysm measures approximately 1.5 cm in length and 2 mm in diameter. IMPRESSION: Partially thrombosed pseudoaneurysm in the right groin. These results were called by telephone at the time of interpretation on 11/28/2019 at 6:11 pm to provider Lavonia Drafts , who verbally acknowledged these results. Electronically Signed   By: Anner Crete M.D.   On: 11/28/2019 18:12     Subjective: Patient continued to have 8/10 right groin pain with any movements around right hip.  Was hopeful that with the help of some rehab she will be able to ambulate on her own and go back home.  Discharge Exam: Vitals:   12/01/19 1425 12/01/19 2355  BP: 113/61 134/68  Pulse: 64  66  Resp: 18 16  Temp: 97.7 F (36.5 C) 98.1 F (36.7 C)  SpO2: 100% 100%   Vitals:   11/30/19 2317 12/01/19 0741 12/01/19 1425 12/01/19 2355  BP: (!) 123/58 (!) 144/79 113/61 134/68  Pulse: 72 67 64 66  Resp: _0 Temp: 98 F (36.7 C) 97.6 F (36.4 C) 97.7 F (36.5 C) 98.1 F (36.7 C)  TempSrc: Oral Oral Oral   SpO2: 100% 100% 100% 100%  Weight:      Height:        General: Pt is alert, awake, not in acute distress Cardiovascular: RRR, S1/S2 +, no rubs, no gallops Respiratory: CTA bilaterally, no wheezing, no rhonchi Abdominal: Soft, NT, ND, bowel sounds + Extremities: no edema, no cyanosis, right groin with clean bandage and multiple ecchymosis.   The results of significant diagnostics from this hospitalization (including imaging, microbiology,  ancillary and laboratory) are listed below for reference.    Microbiology: Recent Results (from the past 240 hour(s))  SARS Coronavirus 2 by RT PCR (hospital order, performed in [facility] hospital lab) Nasopharyngeal Nasopharyngeal Swab     Status: None   Collection Time: 11/28/19  9:52 PM   Specimen: Nasopharyngeal Swab  Result Value Ref Range Status   SARS Coronavirus 2 NEGATIVE NEGATIVE Final    Comment: (NOTE) SARS-CoV-2 target nucleic acids are NOT DETECTED.  The SARS-CoV-2 RNA is generally detectable in upper and lower respiratory specimens during the acute phase of infection. The lowest concentration of SARS-CoV-2 viral copies this assay can detect is 250 copies / mL. A negative result does not preclude SARS-CoV-2 infection and should not be used as the sole basis for treatment or other patient management decisions.  A negative result may occur with improper specimen collection / handling, submission of specimen other than nasopharyngeal swab, presence of viral mutation(s) within the areas targeted by this assay, and inadequate number of viral copies (<250 copies / mL). A negative result must be combined with clinical observations, patient history, and epidemiological information.  Fact Sheet for Patients:   StrictlyIdeas.no  Fact Sheet for Healthcare Providers: BankingDealers.co.za  This test is not yet approved or  cleared by the [place] FDA and has been authorized for detection and/or diagnosis of SARS-CoV-2 by FDA under an Emergency Use Authorization (EUA).  This EUA will remain in effect (meaning this test can be used) for the duration of the COVID-19 declaration under Section 564(b)(1) of the Act, 21 U.S.C. section 360bbb-3(b)(1), unless the authorization is terminated or revoked sooner.  Performed at [facility], [address] [place], [place] [address]      Labs: BNP (last 3 results) No  results for input(s): BNP in the last 8760 hours. Basic Metabolic Panel: Recent Labs  Lab 11/29/19 0445  NA 138  K 4.4  CL 105  CO2 27  GLUCOSE 97  BUN 13  CREATININE 0.70  CALCIUM 8.6*   Liver Function Tests: No results for input(s): AST, ALT, ALKPHOS, BILITOT, PROT, ALBUMIN in the last 168 hours. No results for input(s): LIPASE, AMYLASE in the last 168 hours. No results for input(s): AMMONIA in the last 168 hours. CBC: Recent Labs  Lab 11/29/19 0445 11/30/19 0549 12/02/19 0500  WBC 5.3 5.3 7.0  HGB 10.6* 10.0* 11.1*  HCT 31.8* 30.8* 33.8*  MCV 92.7 95.1 93.4  PLT 142* 157 157   Cardiac Enzymes: No results for input(s): CKTOTAL, CKMB, CKMBINDEX, TROPONINI in the last 168 hours. BNP: Invalid input(s):  POCBNP CBG: No results for input(s): GLUCAP in the last 168 hours. D-Dimer No results for input(s): DDIMER in the last 72 hours. Hgb A1c No results for input(s): HGBA1C in the last 72 hours. Lipid Profile No results for input(s): CHOL, HDL, LDLCALC, TRIG, CHOLHDL, LDLDIRECT in the last 72 hours. Thyroid function studies No results for input(s): TSH, T4TOTAL, T3FREE, THYROIDAB in the last 72 hours.  Invalid input(s): FREET3 Anemia work up No results for input(s): VITAMINB12, FOLATE, FERRITIN, TIBC, IRON, RETICCTPCT in the last 72 hours. Urinalysis    Component Value Date/Time   COLORURINE STRAW (A) 04/18/2018 1022   APPEARANCEUR CLEAR 04/18/2018 1022   APPEARANCEUR Hazy 12/18/2013 1504   LABSPEC 1.015 04/18/2018 1022   LABSPEC 1.015 12/18/2013 1504   PHURINE 8.0 04/18/2018 1022   GLUCOSEU NEGATIVE 04/18/2018 1022   GLUCOSEU Negative 12/18/2013 1504   HGBUR NEGATIVE 04/18/2018 1022   BILIRUBINUR NEGATIVE 04/18/2018 1022   BILIRUBINUR Negative 12/18/2013 1504   KETONESUR NEGATIVE 04/18/2018 1022   PROTEINUR NEGATIVE 04/18/2018 1022   UROBILINOGEN negative 11/13/2011 0907   UROBILINOGEN 0.2 09/19/2009 0518   NITRITE NEGATIVE 04/18/2018 1022   LEUKOCYTESUR  NEGATIVE 04/18/2018 1022   LEUKOCYTESUR 2+ 12/18/2013 1504   Sepsis Labs Invalid input(s): PROCALCITONIN,  WBC,  LACTICIDVEN Microbiology Recent Results (from the past 240 hour(s))  SARS Coronavirus 2 by RT PCR (hospital order, performed in [facility] hospital lab) Nasopharyngeal Nasopharyngeal Swab     Status: None   Collection Time: 11/28/19  9:52 PM   Specimen: Nasopharyngeal Swab  Result Value Ref Range Status   SARS Coronavirus 2 NEGATIVE NEGATIVE Final    Comment: (NOTE) SARS-CoV-2 target nucleic acids are NOT DETECTED.  The SARS-CoV-2 RNA is generally detectable in upper and lower respiratory specimens during the acute phase of infection. The lowest concentration of SARS-CoV-2 viral copies this assay can detect is 250 copies / mL. A negative result does not preclude SARS-CoV-2 infection and should not be used as the sole basis for treatment or other patient management decisions.  A negative result may occur with improper specimen collection / handling, submission of specimen other than nasopharyngeal swab, presence of viral mutation(s) within the areas targeted by this assay, and inadequate number of viral copies (<250 copies / mL). A negative result must be combined with clinical observations, patient history, and epidemiological information.  Fact Sheet for Patients:   StrictlyIdeas.no  Fact Sheet for Healthcare Providers: BankingDealers.co.za  This test is not yet approved or  cleared by the [place] FDA and has been authorized for detection and/or diagnosis of SARS-CoV-2 by FDA under an Emergency Use Authorization (EUA).  This EUA will remain in effect (meaning this test can be used) for the duration of the COVID-19 declaration under Section 564(b)(1) of the Act, 21 U.S.C. section 360bbb-3(b)(1), unless the authorization is terminated or revoked sooner.  Performed at [facility], [address]  [place], [place] [address]     Time coordinating discharge: Over 30 minutes  SIGNED:  Lorella Nimrod, MD  [facility] 12/02/2019, 10:50 AM  If 7PM-7AM, please contact night-coverage [email or web address]  This record has been created using Systems analyst. Errors have been sought and corrected,but may not always be located. Such creation errors do not reflect on the standard of care.

## 2019-12-03 DIAGNOSIS — T81718A Complication of other artery following a procedure, not elsewhere classified, initial encounter: Secondary | ICD-10-CM | POA: Insufficient documentation

## 2019-12-03 DIAGNOSIS — I729 Aneurysm of unspecified site: Secondary | ICD-10-CM | POA: Insufficient documentation

## 2019-12-06 ENCOUNTER — Ambulatory Visit: Payer: Medicare PPO | Admitting: Primary Care

## 2019-12-09 ENCOUNTER — Other Ambulatory Visit (INDEPENDENT_AMBULATORY_CARE_PROVIDER_SITE_OTHER): Payer: Self-pay | Admitting: Vascular Surgery

## 2019-12-09 DIAGNOSIS — I724 Aneurysm of artery of lower extremity: Secondary | ICD-10-CM

## 2019-12-10 ENCOUNTER — Ambulatory Visit: Payer: Medicare PPO | Admitting: Internal Medicine

## 2019-12-14 ENCOUNTER — Other Ambulatory Visit: Payer: Self-pay

## 2019-12-14 ENCOUNTER — Ambulatory Visit (INDEPENDENT_AMBULATORY_CARE_PROVIDER_SITE_OTHER): Payer: Medicare PPO | Admitting: Nurse Practitioner

## 2019-12-14 ENCOUNTER — Ambulatory Visit (INDEPENDENT_AMBULATORY_CARE_PROVIDER_SITE_OTHER): Payer: Medicare PPO

## 2019-12-14 ENCOUNTER — Encounter (INDEPENDENT_AMBULATORY_CARE_PROVIDER_SITE_OTHER): Payer: Self-pay | Admitting: Nurse Practitioner

## 2019-12-14 VITALS — BP 116/73 | HR 68 | Resp 16 | Wt 118.0 lb

## 2019-12-14 DIAGNOSIS — S8012XS Contusion of left lower leg, sequela: Secondary | ICD-10-CM

## 2019-12-14 DIAGNOSIS — I10 Essential (primary) hypertension: Secondary | ICD-10-CM | POA: Diagnosis not present

## 2019-12-14 DIAGNOSIS — I729 Aneurysm of unspecified site: Secondary | ICD-10-CM

## 2019-12-14 DIAGNOSIS — I724 Aneurysm of artery of lower extremity: Secondary | ICD-10-CM | POA: Diagnosis not present

## 2019-12-14 MED ORDER — TRAMADOL HCL 50 MG PO TABS: 50.0000 mg | ORAL_TABLET | Freq: Every evening | ORAL | 0 refills | Status: DC | PRN

## 2019-12-14 NOTE — Progress Notes (Signed)
Subjective:    Patient ID: Tina Calderon, female    DOB: 01/12/1941, 79 y.o.   MRN: [id number] Chief Complaint  Patient presents with  . Follow-up    [facility] 1wk post pseudoanerysm compression    Patient presents today after pseudoaneurysm following right heart catheterization on 11/18/2009.  The patient underwent thrombin injection on 11/29/2019.  Following the procedure the patient notes that the pain has definitely decreased however she still continues to have discomfort.  She has been working on ambulating at her nursing facility.  The pain occurs mostly when she applies pressure on top of where the hematoma is located.  However, most of the bruising has went away and there is no obvious area of swelling or hardness.  She denies any fever, chills, nausea, vomiting or diarrhea.  She denies any significant discoloration of her lower extremity besides the bruising.  She denies any claudication-like symptoms.  Today noninvasive studies show a right groin old pseudoaneurysm seen measuring 3 cm with no flow within status post thrombin injection.  The hematoma remaining is 8 cm in length posterior to the deep profunda artery.  The arterial vessels are widely patent and compressible with a patent common femoral vein.   Review of Systems  Musculoskeletal: Positive for myalgias.  Hematological: Bruises/bleeds easily.  All other systems reviewed and are negative.      Objective:   Physical Exam Vitals reviewed.  HENT:     Head: Normocephalic.  Cardiovascular:     Rate and Rhythm: Normal rate and regular rhythm.     Pulses: Normal pulses.  Pulmonary:     Effort: Pulmonary effort is normal.     Comments: Home oxygen Neurological:     Mental Status: She is alert and oriented to person, place, and time.     Motor: Weakness present.     Gait: Gait abnormal.  Psychiatric:        Mood and Affect: Mood normal.        Behavior: Behavior normal.        Thought Content: Thought content normal.         Judgment: Judgment normal.     BP 116/73 (BP Location: Right Arm)   Pulse 68   Resp 16   Wt 118 lb (53.5 kg)   BMI 21.58 kg/m   Past Medical History:  Diagnosis Date  . Anginal pain ([place])   . Cancer ([place])   . CHF (congestive heart failure) ([place]) 01/22/2012  . Chronic fatigue 07/15/2014  . CVA (cerebral vascular accident) ([place]) 04/18/2018  . Family history of pulmonary fibrosis   . GERD (gastroesophageal reflux disease) 05/12/2017  . Hypertension   . Hypothyroidism   . ILD (interstitial lung disease) ([place]) 02/04/2012  . IPF (idiopathic pulmonary fibrosis) ([place]) 2013  . Mild intermittent asthma 01/29/2016  . Pelvic relaxation   . Prolapse of bladder   . Shortness of breath   . Thyroid disease     Social History   Socioeconomic History  . Marital status: Married    Spouse name: Not on file  . Number of children: 2  . Years of education: Not on file  . Highest education level: Not on file  Occupational History  . Not on file  Tobacco Use  . Smoking status: Never Smoker  . Smokeless tobacco: Never Used  Vaping Use  . Vaping Use: Never used  Substance and Sexual Activity  . Alcohol use: No  . Drug use: No  . Sexual activity:  Never    Birth control/protection: Post-menopausal, Surgical    Comment: HYST  Other Topics Concern  . Not on file  Social History Narrative   Married.   2 children, 5 grandchildren.   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   Enjoys puzzles, sewing, traveling, reading.    Social Determinants of Health   Financial Resource Strain:   . Difficulty of Paying Living Expenses:   Food Insecurity:   . Worried About Charity fundraiser in the Last Year:   . Arboriculturist in the Last Year:   Transportation Needs:   . Film/video editor (Medical):   [name] Lack of Transportation (Non-Medical):   Physical Activity:   . Days of Exercise per Week:   . Minutes of Exercise per Session:   Stress:   . Feeling of Stress :   Social Connections:   .  Frequency of Communication with Friends and Family:   . Frequency of Social Gatherings with Friends and Family:   . Attends Religious Services:   . Active Member of Clubs or Organizations:   . Attends Archivist Meetings:   [name] Marital Status:   Intimate Partner Violence:   . Fear of Current or Ex-Partner:   . Emotionally Abused:   [name] Physically Abused:   . Sexually Abused:     Past Surgical History:  Procedure Laterality Date  . ANTERIOR AND POSTERIOR REPAIR  12/05/2011   Procedure: ANTERIOR (CYSTOCELE) AND POSTERIOR REPAIR (RECTOCELE);  Surgeon: [name], MD;  Location: [facility];  Service: Gynecology;  Laterality: N/A;  with cysto  . Bladder tact  15 years ago  . COLONOSCOPY WITH PROPOFOL N/A 04/20/2019   Procedure: COLONOSCOPY WITH PROPOFOL;  Surgeon: [name], MD;  Location: [facility] ENDOSCOPY;  Service: Gastroenterology;  Laterality: N/A;  . LEFT HEART CATHETERIZATION WITH CORONARY ANGIOGRAM N/A 01/22/2012   Procedure: LEFT HEART CATHETERIZATION WITH CORONARY ANGIOGRAM;  Surgeon: [name], MD;  Location: [facility] CATH LAB;  Service: Cardiovascular;  Laterality: N/A;  . NECK SURGERY  2010  . PSEUDOANERYSM COMPRESSION Right 11/29/2019   Procedure: PSEUDOANERYSM COMPRESSION;  Surgeon: Algernon Huxley, MD;  Location: [facility] LAB;  Service: Cardiovascular;  Laterality: Right;  . RIGHT HEART CATH N/A 11/19/2019   Procedure: RIGHT HEART CATH;  Surgeon: [name], MD;  Location: [facility];  Service: Cardiovascular;  Laterality: N/A;  . SHOULDER SURGERY    . thyroid disease    . THYROID LOBECTOMY Left 07/17/2018   Procedure: LEFT THYROID LOBECTOMY;  Surgeon: [name], MD;  Location: [facility];  Service: General;  Laterality: Left;  . TONSILLECTOMY    . VAGINAL HYSTERECTOMY  15 years ago    Family History  Problem Relation Age of Onset  . Thyroid disease Mother   . Hypertension Mother   . Coronary artery disease Mother   . Dementia Mother   . Stroke  Mother   . Aneurysm Father   . Pulmonary fibrosis Sister   . Stroke Other   . Sleep apnea Other   . Liver disease Brother   . Pulmonary fibrosis Brother   . Stroke Maternal Grandmother   . Dementia Maternal Grandmother   . Heart attack Maternal Grandfather 67  . COPD Son     Allergies  Allergen Reactions  . Crestor [Rosuvastatin Calcium] Other (See Comments)    Myalgias, muscle weakness  . Dilaudid [Hydromorphone] Nausea And Vomiting       Assessment & Plan:  1. Pseudoaneurysm ([place]) Noninvasive studies today show no flow within previous pseudoaneurysm.  There is still palpable however becoming soft.  We will have the patient return to the office in 4 weeks for reevaluation of progression of pseudoaneurysm in addition to the hematoma.  2. Hematoma of lower extremity, left, sequela Per the noninvasive studies patient has a remaining hematoma 8 cm in length that is posterior to the deep profunda artery.  All of her arterial vessels are widely compressible with a patent common femoral vein.  The patient is still having some significant discomfort which is not unreasonable.  We will have the patient begin taking Tylenol during the day for pain and tramadol during her hour sleep for pain relief.  Patient is also advised that she can use warm compresses to help with the discomfort as well.  Again we will reevaluate hematoma size in 4 weeks. - traMADol (ULTRAM) 50 MG tablet; Take 1 tablet (50 mg total) by mouth at bedtime as needed.  Dispense: 20 tablet; Refill: 0  3. Essential hypertension Continue antihypertensive medications as already ordered, these medications have been reviewed and there are no changes at this time.    Current Outpatient Medications on File Prior to Visit  Medication Sig Dispense Refill  . acetaminophen (TYLENOL) 500 MG tablet Take 1,000 mg by mouth every 6 (six) hours as needed for mild pain or headache.     [name] amLODipine (NORVASC) 5 MG tablet TAKE 1 TABLET BY  MOUTH EVERY DAY FOR BLOOD PRESSURE (Patient taking differently: Take 5 mg by mouth daily. ) 90 tablet 1  . atorvastatin (LIPITOR) 40 MG tablet TAKE 1 TABLET (40 MG TOTAL) BY MOUTH AT BEDTIME. FOR CHOLESTEROL. 90 tablet 0  . CALCIUM CARBONATE-VITAMIN D PO Take 1 tablet by mouth daily.     . clopidogrel (PLAVIX) 75 MG tablet TAKE 1 TABLET (75 MG TOTAL) BY MOUTH DAILY. FOR STROKE PREVENTION. 90 tablet 1  . CRANBERRY PO Take 1 capsule by mouth every Monday, Wednesday, and Friday.     . ferrous sulfate 325 (65 FE) MG tablet Take 325 mg by mouth every evening.     [name] levothyroxine (SYNTHROID) 100 MCG tablet TAKE 1 TABLET BY MOUTH EVERY MORNING ON AN EMPTY STOMACH. NO FOOD OR OTHER MEDICATIONS FOR 30 MIN (Patient taking differently: Take 100 mcg by mouth daily before breakfast. AN EMPTY STOMACH. NO FOOD OR OTHER MEDICATIONS FOR 30 MIN) 90 tablet 1  . loratadine (CLARITIN) 10 MG tablet Take 10 mg by mouth daily.     . metoprolol succinate (TOPROL-XL) 25 MG 24 hr tablet Take 1 tablet (25 mg total) by mouth daily. 30 tablet 11  . Multiple Vitamins-Minerals (CENTRUM SILVER PO) Take 1 tablet by mouth daily.    [name] OFEV 150 MG CAPS Take 150 mg by mouth every 12 (twelve) hours. As Directed    . omeprazole (PRILOSEC) 20 MG capsule TAKE 1 CAPSULE BY MOUTH EVERY DAY (Patient taking differently: Take 20 mg by mouth daily. ) 90 capsule 1  . traMADol (ULTRAM) 50 MG tablet Take 1 tablet (50 mg total) by mouth every 12 (twelve) hours as needed for moderate pain or severe pain. 30 tablet 0  . traZODone (DESYREL) 50 MG tablet Take 0.5 tablets (25 mg total) by mouth at bedtime as needed for sleep.     No current facility-administered medications on file prior to visit.    There are no Patient Instructions on file for this visit. No follow-ups on file.  Kris Hartmann, NP

## 2019-12-16 ENCOUNTER — Ambulatory Visit: Payer: Medicare PPO

## 2019-12-17 ENCOUNTER — Telehealth: Payer: Self-pay

## 2019-12-17 NOTE — Telephone Encounter (Signed)
Pt is aware of date/time of covid test prior to PFT. Pt voiced her understanding and had no further questions.  Nothing further is needed.

## 2019-12-20 ENCOUNTER — Other Ambulatory Visit: Payer: Self-pay

## 2019-12-20 ENCOUNTER — Other Ambulatory Visit
Admission: RE | Admit: 2019-12-20 | Discharge: 2019-12-20 | Disposition: A | Discharge status: Home / Self Care | Payer: Medicare PPO | Source: Ambulatory Visit | Attending: Internal Medicine | Admitting: Internal Medicine

## 2019-12-20 DIAGNOSIS — Z20822 Contact with and (suspected) exposure to covid-19: Secondary | ICD-10-CM | POA: Insufficient documentation

## 2019-12-20 LAB — SARS CORONAVIRUS 2 (TAT 6-24 HRS): SARS Coronavirus 2: NEGATIVE

## 2019-12-21 ENCOUNTER — Ambulatory Visit: Payer: Medicare PPO | Attending: Internal Medicine

## 2019-12-21 DIAGNOSIS — J84112 Idiopathic pulmonary fibrosis: Secondary | ICD-10-CM

## 2019-12-24 ENCOUNTER — Encounter (INDEPENDENT_AMBULATORY_CARE_PROVIDER_SITE_OTHER): Payer: Medicare Other

## 2019-12-24 ENCOUNTER — Ambulatory Visit (INDEPENDENT_AMBULATORY_CARE_PROVIDER_SITE_OTHER): Payer: Medicare Other | Admitting: Nurse Practitioner

## 2019-12-28 ENCOUNTER — Other Ambulatory Visit: Payer: Self-pay

## 2019-12-28 ENCOUNTER — Encounter: Payer: Self-pay | Admitting: Cardiology

## 2019-12-28 ENCOUNTER — Ambulatory Visit (INDEPENDENT_AMBULATORY_CARE_PROVIDER_SITE_OTHER): Payer: Medicare PPO | Admitting: Cardiology

## 2019-12-28 VITALS — BP 114/68 | HR 78 | Temp 97.4°F | Ht 62.5 in | Wt 120.8 lb

## 2019-12-28 DIAGNOSIS — I10 Essential (primary) hypertension: Secondary | ICD-10-CM

## 2019-12-28 DIAGNOSIS — Z8673 Personal history of transient ischemic attack (TIA), and cerebral infarction without residual deficits: Secondary | ICD-10-CM

## 2019-12-28 DIAGNOSIS — J84112 Idiopathic pulmonary fibrosis: Secondary | ICD-10-CM | POA: Diagnosis not present

## 2019-12-28 DIAGNOSIS — R0602 Shortness of breath: Secondary | ICD-10-CM | POA: Diagnosis not present

## 2019-12-28 DIAGNOSIS — I272 Pulmonary hypertension, unspecified: Secondary | ICD-10-CM | POA: Diagnosis not present

## 2019-12-28 DIAGNOSIS — R000 Tachycardia, unspecified: Secondary | ICD-10-CM | POA: Diagnosis not present

## 2019-12-28 NOTE — Patient Instructions (Signed)
Medication Instructions:  Your physician recommends that you continue on your current medications as directed. Please refer to the Current Medication list given to you today.  *If you need a refill on your cardiac medications before your next appointment, please call your pharmacy*   Follow-Up: At [facility], you and your health needs are our priority.  As part of our continuing mission to provide you with exceptional heart care, we have created designated Provider Care Teams.  These Care Teams include your primary Cardiologist (physician) and Advanced Practice Providers (APPs -  Physician Assistants and Nurse Practitioners) who all work together to provide you with the care you need, when you need it.  We recommend signing up for the patient portal called "MyChart".  Sign up information is provided on this After Visit Summary.  MyChart is used to connect with patients for Virtual Visits (Telemedicine).  Patients are able to view lab/test results, encounter notes, upcoming appointments, etc.  Non-urgent messages can be sent to your provider as well.   To learn more about what you can do with MyChart, go to [email or web address].    Your next appointment:   3 month(s)  The format for your next appointment:   In Person  Provider:   You may see Buford Dresser, MD or one of the following Advanced Practice Providers on your designated Care Team:    Rosaria Ferries, PA-C  Jory Sims, DNP, ANP  Cadence Kathlen Mody, PA-C    Other Instructions

## 2019-12-28 NOTE — Progress Notes (Signed)
Cardiology Office Note:    Date:  12/28/2019   ID:  Tina Calderon, Tina Calderon 01/06/1941, MRN [id number]  PCP:  [name], NP  Cardiologist:  Buford Dresser, MD  Referring MD: [name], NP   CC: Follow up  History of Present Illness:    Tina Calderon is a 79 y.o. female with a hx of pulmonary fibrosis who is seen in follow up for evaluation and management of tachycardia, fatigue, exertional intolerance.  Summary from initial visit 01/22/18: She presented to [facility] ER on 01/12/18 with palpitations and shortness of breath. At pulmonary rehab that morning, her heart rate was noted to be 120 bpm (patient states this was at rest). In the ER, her ECG showed sinus tachycardia at 104 bpm, CT was negative for PE, negative troponin. She feels exhausted with basic everyday activities. For instance, she cooks breakfast every morning for herself and her husband. By the time she is done, she has no appetite. Often she has to lie down to rest for a time after. She is supposed to do about 50 minutes of near continuous cardiovascular activity at pulmonary rehab, and she is completely exhausted by the end of this. She endorses good food and fluid intake despite her fatigue. No infectious symptoms or recent illnesses.  Today: Since last visit, completed RHC as requested by Dr. [name]. Unfortunately, this was complicated by hematoma and pseudoaneurysm of RFA cath site, requiring thrombin injection at [facility]. Required 10 days of SNF care after.   Reviewed results of right heart cath today. Has upcoming appt this week with Dr. [name] to discuss next steps.   She continues to have shortness of breath, which she feels is progressive.  Denies chest pain. No PND, orthopnea, LE edema or unexpected weight gain. No syncope or palpitations.  Past Medical History:  Diagnosis Date  . Anginal pain ([place])   . Cancer ([place])   . CHF (congestive heart failure) ([place]) 01/22/2012  . Chronic  fatigue 07/15/2014  . CVA (cerebral vascular accident) ([place]) 04/18/2018  . Family history of pulmonary fibrosis   . GERD (gastroesophageal reflux disease) 05/12/2017  . Hypertension   . Hypothyroidism   . ILD (interstitial lung disease) ([place]) 02/04/2012  . IPF (idiopathic pulmonary fibrosis) ([place]) 2013  . Mild intermittent asthma 01/29/2016  . Pelvic relaxation   . Prolapse of bladder   . Shortness of breath   . Thyroid disease     Past Surgical History:  Procedure Laterality Date  . ANTERIOR AND POSTERIOR REPAIR  12/05/2011   Procedure: ANTERIOR (CYSTOCELE) AND POSTERIOR REPAIR (RECTOCELE);  Surgeon: [name], MD;  Location: [facility];  Service: Gynecology;  Laterality: N/A;  with cysto  . Bladder tact  15 years ago  . COLONOSCOPY WITH PROPOFOL N/A 04/20/2019   Procedure: COLONOSCOPY WITH PROPOFOL;  Surgeon: [name], MD;  Location[facility] ENDOSCOPY;  Service: Gastroenterology;  Laterality: N/A;  . LEFT HEART CATHETERIZATION WITH CORONARY ANGIOGRAM N/A 01/22/2012   Procedure: LEFT HEART CATHETERIZATION WITH CORONARY ANGIOGRAM;  Surgeon: [name], MD;  Location: [facility] CATH LAB;  Service: Cardiovascular;  Laterality: N/A;  . NECK SURGERY  2010  . PSEUDOANERYSM COMPRESSION Right 11/29/2019   Procedure: PSEUDOANERYSM COMPRESSION;  Surgeon: Algernon Huxley, MD;  Location: [place] CV LAB;  Service: Cardiovascular;  Laterality: Right;  . RIGHT HEART CATH N/A 11/19/2019   Procedure: RIGHT HEART CATH;  Surgeon: [name], MD;  Location: [place] CV LAB;  Service: Cardiovascular;  Laterality: N/A;  .  SHOULDER SURGERY    . thyroid disease    . THYROID LOBECTOMY Left 07/17/2018   Procedure: LEFT THYROID LOBECTOMY;  Surgeon: [name], MD;  Location: [facility];  Service: General;  Laterality: Left;  . TONSILLECTOMY    . VAGINAL HYSTERECTOMY  15 years ago    Current Medications: Current Outpatient Medications on File Prior to Visit  Medication Sig  . acetaminophen (TYLENOL) 500 MG  tablet Take 1,000 mg by mouth every 6 (six) hours as needed for mild pain or headache.   [name] amLODipine (NORVASC) 5 MG tablet TAKE 1 TABLET BY MOUTH EVERY DAY FOR BLOOD PRESSURE (Patient taking differently: Take 5 mg by mouth daily. )  . atorvastatin (LIPITOR) 40 MG tablet TAKE 1 TABLET (40 MG TOTAL) BY MOUTH AT BEDTIME. FOR CHOLESTEROL.  [name] CALCIUM CARBONATE-VITAMIN D PO Take 1 tablet by mouth daily.   . clopidogrel (PLAVIX) 75 MG tablet TAKE 1 TABLET (75 MG TOTAL) BY MOUTH DAILY. FOR STROKE PREVENTION.  [name] CRANBERRY PO Take 1 capsule by mouth every Monday, Wednesday, and Friday.   . ferrous sulfate 325 (65 FE) MG tablet Take 325 mg by mouth every evening.   [name] levothyroxine (SYNTHROID) 100 MCG tablet TAKE 1 TABLET BY MOUTH EVERY MORNING ON AN EMPTY STOMACH. NO FOOD OR OTHER MEDICATIONS FOR 30 MIN (Patient taking differently: Take 100 mcg by mouth daily before breakfast. AN EMPTY STOMACH. NO FOOD OR OTHER MEDICATIONS FOR 30 MIN)  . loratadine (CLARITIN) 10 MG tablet Take 10 mg by mouth daily.   . metoprolol succinate (TOPROL-XL) 25 MG 24 hr tablet Take 1 tablet (25 mg total) by mouth daily.  . Multiple Vitamins-Minerals (CENTRUM SILVER PO) Take 1 tablet by mouth daily.  [name] OFEV 150 MG CAPS Take 150 mg by mouth every 12 (twelve) hours. As Directed  . omeprazole (PRILOSEC) 20 MG capsule TAKE 1 CAPSULE BY MOUTH EVERY DAY (Patient taking differently: Take 20 mg by mouth daily. )   No current facility-administered medications on file prior to visit.     Allergies:   Crestor [rosuvastatin calcium] and Dilaudid [hydromorphone]   Social History   Tobacco Use  . Smoking status: Never Smoker  . Smokeless tobacco: Never Used  Vaping Use  . Vaping Use: Never used  Substance Use Topics  . Alcohol use: No  . Drug use: No    Family History: The patient's family history includes Aneurysm in her father; COPD in her son; Coronary artery disease in her mother; Dementia in her maternal grandmother and mother;  Heart attack (age of onset: 53) in her maternal grandfather; Hypertension in her mother; Liver disease in her brother; Pulmonary fibrosis in her brother and sister; Sleep apnea in an other family member; Stroke in her maternal grandmother, mother, and another family member; Thyroid disease in her mother.  ROS:   ROS as per HPI, otherwise unremarkable  EKGs/Labs/Other Studies Reviewed:    The following studies were reviewed today: [place] 2019-12-07 RA: A-wave 7, V wave 6, mean 2 RV: 62/4 PA: 68/31; mean 46 PW: A-wave 7, V wave 8, mean 6  Oxygen saturation in the AO 100% and in the PA 80% with the patient on 3 L of oxygen.  By the Fick method, cardiac output 6.8 L/min and cardiac index 4.3 L/min/m. By the thermodilution method, cardiac output 5.7 L/min and cardiac index 3.6 L/min/m.  PVR: By the Fick method 5.9 WU and by the thermodilution method 7.1 WU  MPI 04/01/18  Nuclear stress EF: 68%.  Blood pressure demonstrated a normal response to exercise.  There was no ST segment deviation noted during stress.  The study is normal.  This is a low risk study.  The left ventricular ejection fraction is hyperdynamic (>65%).  Echo 04/19/18 - Left ventricle: The cavity size was normal. Wall thickness was   increased in a pattern of mild LVH. Systolic function was   vigorous. The estimated ejection fraction was in the range of 65%   to 70%. Wall motion was normal; there were no regional wall   motion abnormalities. Doppler parameters are consistent with   abnormal left ventricular relaxation (grade 1 diastolic   dysfunction). Doppler parameters are consistent with   indeterminate ventricular filling pressure. - Aortic valve: Mildly calcified annulus. Trileaflet. - Mitral valve: Mildly calcified annulus. - Left atrium: The atrium was mildly dilated. - Atrial septum: No defect or patent foramen ovale was identified. - Tricuspid valve: There was moderate regurgitation. - Pulmonary  arteries: PA peak pressure: 63 mm Hg (S). - Pericardium, extracardiac: A trivial pericardial effusion was   identified.  Echo 01/31/18 Study Conclusions  - Left ventricle: The cavity size was normal. Wall thickness was   normal. Systolic function was normal. The estimated ejection   fraction was in the range of 60% to 65%. Wall motion was normal;   there were no regional wall motion abnormalities. Doppler   parameters are consistent with abnormal left ventricular   relaxation (grade 1 diastolic dysfunction). - Aortic valve: Mildly calcified annulus. Normal thickness   leaflets. Valve area (VTI): 1.83 cm^2. Valve area (Vmax): 2.06   cm^2. Valve area (Vmean): 1.74 cm^2. - Mitral valve: Mildly calcified annulus. Normal thickness leaflets   . - Left atrium: The atrium was severely dilated. - Right ventricle: The cavity size was mildly dilated. - Technically adequate study.  Elevated RA pressures, RV-RA gradient 47 mmHg.   Monitor 01/29/18 Ventricular tachycardia-nonsustained-up to 5 beats Atrial tachycardia-37 episodes    Symptoms of fluttering sinus rhythm;                              rare PAC                             Isolated PVC Symptoms of shortness of breath PACs/PVCs  Long term monitor reviewed. All of her patient triggered events are sinus rhythm, occasionally tachycardia, rare isolated ectopy. She had one episode of 5 beats of NSVT without symptoms, and she had one brief episode of 3 beats of SVT. Rest are isolated atrial ectopy and rare PVCs.  CT angio chest 01/12/18 IMPRESSION: 1. No acute findings. No pulmonary embolism seen. No evidence of pneumonia or pulmonary edema. 2. 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 3. Large hiatal hernia, stable. 4. Cardiomegaly. 5. Coronary artery calcifications.  EKG:  EKG personally reviewed,  ECG from  11/19/19 is NSR, with PACs and low anteroseptal forces  Recent Labs: 10/15/2019: ALT 27 11/29/2019: BUN 13; Creatinine, Ser 0.70; Potassium 4.4; Sodium 138 12/02/2019: Hemoglobin 11.1; Platelets 157  Recent Lipid Panel    Component Value Date/Time   CHOL 137 12/31/2018 1412   TRIG 162.0 (H) 12/31/2018 1412   HDL 51.20 12/31/2018 1412   CHOLHDL 3 12/31/2018 1412   VLDL 32.4 12/31/2018 1412   LDLCALC 54 12/31/2018 1412  Physical Exam:    VS:  BP 114/68   Pulse 78   Temp (!) 97.4 F (36.3 C)   Ht 5' 2.5" (1.588 m)   Wt 120 lb 12.8 oz (54.8 kg)   SpO2 90%   BMI 21.74 kg/m     Wt Readings from Last 3 Encounters:  12/28/19 120 lb 12.8 oz (54.8 kg)  12/14/19 118 lb (53.5 kg)  11/28/19 131 lb (59.4 kg)   GEN: frail elderly woman, [place] in place for O2 HEENT: Normal, moist mucous membranes NECK: No JVD CARDIAC: regular rhythm, normal S1 and S2, no rubs or gallops. No murmur. VASCULAR: Radial and DP pulses 2+ bilaterally. No carotid bruits RESPIRATORY:  velcro crackles in lungs bilaterally ABDOMEN: Soft, non-tender, non-distended MUSCULOSKELETAL:  Ambulates independently SKIN: Warm and dry, no edema NEUROLOGIC:  Alert and oriented x 3. No focal neuro deficits noted. PSYCHIATRIC:  Normal affect   ASSESSMENT:    1. Shortness of breath   2. Pulmonary hypertension, unspecified ([place])   3. IPF (idiopathic pulmonary fibrosis) ([place])   4. Tachycardia   5. Essential hypertension   6. History of CVA (cerebrovascular accident)    PLAN:    Shortness of breath, chronic hypoxemic respiratory failure, severe pulmonary hypertension: secondary to her IPF -unfortunately recent right heart cath complicated by pseudoaneurysm at site, now resolved -has upcoming appt with pulmonology to discuss options -follows closely with Dr. [name] -on O2 by nasal cannula chronically  palpitations/tachycardia:  -well controlled on metoprolol without worsening her breathing -stable symptoms, though  exertion does elevated her heart rate rapidly  Hypertension: -at goal today -continue amlodipine, metoprolol.  Secondary prevention (history of CVA): -recommend heart healthy/Mediterranean diet, with whole grains, fruits, vegetable, fish, lean meats, nuts, and olive oil. Limit salt. -recommend moderate walking, 3-5 times/week for 30-50 minutes each session. Aim for at least 150 minutes.week. Goal should be pace of 3 miles/hours, or walking 1.5 miles in 30 minutes -recommend avoidance of tobacco products. Avoid excess alcohol. -doing well on clopidogrel and atorvastatin  Plan for follow up: 3 mos or sooner PRN  Buford Dresser, MD, PhD [place]  [facility]   Medication Adjustments/Labs and Tests Ordered: Current medicines are reviewed at length with the patient today.  Concerns regarding medicines are outlined above.  No orders of the defined types were placed in this encounter.  No orders of the defined types were placed in this encounter.   Patient Instructions  Medication Instructions:  Your physician recommends that you continue on your current medications as directed. Please refer to the Current Medication list given to you today.  *If you need a refill on your cardiac medications before your next appointment, please call your pharmacy*   Follow-Up: At [facility], you and your health needs are our priority.  As part of our continuing mission to provide you with exceptional heart care, we have created designated Provider Care Teams.  These Care Teams include your primary Cardiologist (physician) and Advanced Practice Providers (APPs -  Physician Assistants and Nurse Practitioners) who all work together to provide you with the care you need, when you need it.  We recommend signing up for the patient portal called "MyChart".  Sign up information is provided on this After Visit Summary.  MyChart is used to connect with patients for Virtual Visits (Telemedicine).   Patients are able to view lab/test results, encounter notes, upcoming appointments, etc.  Non-urgent messages can be sent to your provider as well.   To learn more  about what you can do with MyChart, go to [email or web address].    Your next appointment:   3 month(s)  The format for your next appointment:   In Person  Provider:   You may see Buford Dresser, MD or one of the following Advanced Practice Providers on your designated Care Team:    Rosaria Ferries, PA-C  Jory Sims, DNP, ANP  Cadence Kathlen Mody, PA-C    Other Instructions    Signed, Buford Dresser, MD PhD 12/28/2019    [place]

## 2019-12-31 ENCOUNTER — Ambulatory Visit: Payer: Medicare PPO | Admitting: Internal Medicine

## 2019-12-31 ENCOUNTER — Other Ambulatory Visit: Payer: Self-pay

## 2019-12-31 ENCOUNTER — Encounter: Payer: Self-pay | Admitting: Internal Medicine

## 2019-12-31 VITALS — BP 112/70 | HR 115 | Temp 97.3°F | Ht 62.5 in | Wt 115.0 lb

## 2019-12-31 DIAGNOSIS — I272 Pulmonary hypertension, unspecified: Secondary | ICD-10-CM | POA: Diagnosis not present

## 2019-12-31 DIAGNOSIS — J84112 Idiopathic pulmonary fibrosis: Secondary | ICD-10-CM

## 2019-12-31 DIAGNOSIS — R634 Abnormal weight loss: Secondary | ICD-10-CM

## 2019-12-31 DIAGNOSIS — J9611 Chronic respiratory failure with hypoxia: Secondary | ICD-10-CM | POA: Diagnosis not present

## 2019-12-31 NOTE — Progress Notes (Signed)
79 yo female never smoker seen for initial pulmonary consult during hospital for acute respiratory failure   [facility] follow up 01/30/12 -  Pt was admitted 2 weeks ago for chest pain and dyspnea.  Found to have Acute respiratory failure with hypoxia--  Tx for possible diastolic CHF with aggressive diuresis Echo showed EF 60% , no mention of diastolic dysfxn.  HRCT consistent with ILD-  CCM consult with autoimmune workup that was unrevealing with neg ANA, ANCA, RA factor , nml ESR , CRP    Does light housework but not able walk long distances due to dyspnea  FH of pulmonary fibrosis -sibling -they were smokers. NO significant desats with walking in cardiology yesterday, desated to 93% on room air.  No cough or wheezing  No edema.   Since discharge she is feeling better with less DOE. But does get winded with walking long distances.   ROV 02/21/12 -- follows up for ILD noted during hospitalization for resp failure. Treated for possible diastolic CHF. CT scan shows some mild basilar fibrotic change. PFT's 9/3 >> probable mixed disease, no BD response, restricted volumes, decrease DLCO that corrects for Va.  She had family hx of ILD.  She had walking oximetry that she says was reassuring. She feels no better today than at hospital discharge.   ROV 03/24/12 -- ILD without clear etiology, mixed dz on PFT 02/11/12. Initiated a trial prednisone last month >> She doesn't believe that her dyspnea has changed.  Last time she did not desat.   ROV 06/23/12 -- ILD without clear etiology, mixed dz on PFT 02/11/12. Trial of pred >> weaned to off. Sent her to pulm rehab at [facility], feels that it has benefited her. She isn't as SOB as before. CXR today >> stable bibasilar ILD, hiatal hernia.   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 Also possible component diastolic dysfxn. She has a family hx of ILD.  She tells me that her dyspnea is worse compared with last year. She is having some spells of near syncope on exertion. Walked today to exhaustion without desaturation.   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 She has been part of the pirfenidone trial and has been on the medication. She will be transitioning to the commercial drug. Denies any side effects from the medication. I completed financial assistance forms on 04/12/13.  Due for CXR today. Not really doing exercise - she did do pulmonary rehab at [facility].    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 Last CXR 05/19/13, last CT scan 01/24/12. She is still on the pirfenidone trial, receiving study drug. She is fairly stable. Will be converted to the commercial med soon. Tolerates soon.   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 Returns for f/u. She is now on pirfenidone 3 pills tid and is tolerating. She is having more problems with fatigue and dyspnea with exertion - has lay down after eating dinner, with chores. Her synthroid hasn't been adjusted in a long time.  Her last labs were March 3, 15.   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 She is on pirfenidone 3 pills tid. She has been tolerating well from GI standpoint. TSH normal. She has some fatigue, otherwise doing  well. She needs LFT done in 3 months and after that we can go to every 6 months,.   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 On pirfenidone 3 pills tid since August 2014. Last CT scan 5/'15 was stable, described as an NSIP pattern.  LFT were done today, not yet available. She has noticed some slightly raised pale pink macules, not pruritic, since last month. She has  been careful to wear sunscreen.    ROV 12/01/17 --this follow-up visit for patient with a history of idiopathic interstitial lung disease, some coexisting obstruction based on her spirometry.  She also has a hiatal hernia with GERD and chronic cough.  We have managed her on pirfenidone.  At our last visit she noted that she is had some decreased functional capacity, more dyspnea with exertion. She is still having decreased energy, more fatigue with activity. We checked CBC and TSH, both reassuring. She keeps a slight cough, added chlorpheniramine to loratadine. Remains on omeprazole.    She underwent a 6-minute walk test today and was able to walk 462 m without desaturation.  Her last in 04/2016 was 444mRepeat CT chest from 11/28/2017 reviewed and shows stable interstitial changes compared with her most recent from 2017.  There is patchy groundglass attenuation with associated septal thickening and peripheral bronchial ectasis without overt honeycomb change.  No effusion, no suspicious nodules.   ROV 06/18/18 --[name] is 89 followed for history of idiopathic interstitial lung disease and some coexisting obstruction noted on spirometry.  She also has a hiatal hernia with GERD and some associated chronic cough.  We have been managing her on pirfenidone - no side effects.  She is on omeprazole, loratadine, chlorpheniramine.  Since our last visit she was hospitalized for a stroke, has some residual word-finding trouble. She is scheduled for nerve conduction studies after some falls and leg weakness. She is also being evaluated for possible thyroid surgery with Dr. GHarlow Asa She had a CT chest done 01/12/18 that I reviewed, shows no real change in NSIP pattern, large HH, no new infiltrates. Overall reports that breathing is stable, not limiting.   ROV 12/03/2018 --pleasant 79year old woman with mixed lung disease.  She has mild obstruction and restriction in the setting of a hiatal hernia and idiopathic  interstitial lung disease that would characterizes IPF.  We have been managing her on pirfenidone.  Also with a history of GERD on Prilosec, allergic rhinitis on loratadine. Last LFT in 05/2018 were normal. Last Ct chest was 01/2018. She is having progressive exertional SOB compared with last time. She has to rest doing housework, taking out the trash. She has to stop to rest. Not on BD's, didn't benefit. She had thyroid sgy in 07/2018, just had her synthroid increased in 07/2018. TSH was 3.05 5/26.     OV 02/25/2019  Subjective:  Patient ID: NRutherford Limerick female , DOB: 723-Mar-1942, age 89y.o. , MRN: [id number], AD[id number] [id number]  02/25/2019 -   Chief Complaint  Patient presents with  . IPF (idiopathic pulmonary fibrosis)    Had 6 minute walk yesterday.     Clinical IPF patient  - On Esbreit since aug 2014 initially via  expanded access program   - Autoimmune panel 2013 and 2014 is negative and normal   - Pulmonary function test 02/15/2014 postbronchodilator FVC 1.55 L/  59%, total lung capacity 3.05 L/64%, DLCO 14.3/66%  - CT  chest  May 2015 - NSIP per radioolgy  - Trial participagnt early 08-01-2014 for 6 months IPF - Phase 4 study - got esbriet and 6 months of add-on ofev   HPI Dyamon Sosinski 79 y.o. -has now been referred to the ILD clinic.  This is because she has had progression in her symptoms.  She says for the last few months she has had progressive shortness of breath but no worsening cough or pedal edema or hemoptysis or proximal nocturnal dyspnea.  She is tolerating the Esbriet well since August 2014 when she was in the expanded access program.  Overall she is stable and she is active in the pulmonary fibrosis foundation support group but in the last few months is progressive dyspnea.  Nurse practitioner did a high-resolution CT scan of the chest and it shows progression although the pattern is still indeterminate for UIP.  In addition  6-minute walk test last week she had new onset desaturation and has been started on portable oxygen which is helping.  However she does not have nighttime oxygen.  There is a spirometry that is upcoming.  Review of the spirometry is between 2013/08/01 and 08/02/15 show clear progression in both FVC and DLCO.  Previously serology was negative in 20 13/2014 but she says she might have Raynaud Echocardiogram November 2019 with normal ejection fraction and grade 1 diastolic dysfunction  [facility] Integrated Comprehensive ILD Questionnaire  Symptoms: -ILD diagnosed in 20 14/2015.  Which is gradually worse in the last several months.  She is having level for shortness of breath the household work shopping and walking on level ground at her own pace.  She does not think any other condition is limiting her.  No cough.  No wheezing   Past Medical History : Negative for collagen vascular disease acid reflux but positive for previous stroke in November 2019 and thyroid disease.  Detailed past medical history is negative otherwise   ROS: Positive for fatigue   FAMILY HISTORY of LUNG DISEASE: Her sister died in 78 from pulmonary fibrosis and a brother died in 08/01/04 from pulmonary fibrosis   EXPOSURE HISTORY: No smoking no cigars.  No vaping no marijuana.  No cocaine.  No intravenous drug use.   HOME and HOBBY DETAILS : Lives in an apartment.  No mold exposure no mildew exposure.  Extensive home exposure history is negative.   OCCUPATIONAL HISTORY (122 questions) : Positive for gardening but otherwise negative.   PULMONARY TOXICITY HISTORY (27 items): Intermittent prednisone burst otherwise negative.     Results for SKI, POLICH (MRN [id number]) as of 02/25/2019 11:43  Ref. Range 02/15/2014 11:51 07/15/2014 11:47 01/03/2015 09:40 03/17/2015 13:03 10/23/2015 08:45  FVC-Pre Latest Units: L 1.58 1.59 1.52 1.47 1.44   Results for KAURI, GARSON (MRN [id number]) as of 02/25/2019 11:43  Ref. Range 02/15/2014  11:51 07/15/2014 11:47 01/03/2015 09:40 03/17/2015 13:03 10/23/2015 08:45  DLCO unc Latest Units: ml/min/mmHg 14.30 13.65 13.66 12.11 12.65  DLCO unc % pred Latest Units: % 66 63 63 56 58    CT chest high resolution Aug 2020 -personally visualized  standard protocol without intravenous contrast. High resolution imaging of the lungs, as well as inspiratory and expiratory imaging, was performed.  COMPARISON:  11/28/2017, 10/09/2015, 01/24/2012  FINDINGS: Cardiovascular: Aortic atherosclerosis. Cardiomegaly. Left coronary artery calcifications. No pericardial effusion.  Mediastinum/Nodes: No enlarged mediastinal, hilar, or axillary lymph nodes. Large hiatal hernia with intrathoracic position of the gastric body and fundus. Thyroid gland, trachea, and esophagus demonstrate  no significant findings.  Lungs/Pleura: Redemonstrated moderate pulmonary fibrosis in a pattern with an apical to basal gradient, featuring peripheral interstitial opacity and some suggestion of nodularity, mild traction bronchiectasis, and occasional areas of subpleural bronchiolectasis in the bases. There is no evidence of honeycombing. Moderate lobular air trapping on expiratory phase imaging. Findings are not significantly changed when compared to immediate prior examination dated 11/28/2017, however are slightly worsened over time on comparison to multiple prior examinations dating back to at least 01/24/2012. No pleural effusion or pneumothorax.  Upper Abdomen: No acute abnormality.  Musculoskeletal: No chest wall mass or suspicious bone lesions identified.  IMPRESSION: 1. Redemonstrated moderate pulmonary fibrosis in a pattern with an apical to basal gradient, featuring peripheral interstitial opacity and some suggestion of nodularity, mild traction bronchiectasis, and occasional areas of subpleural bronchiolectasis in the bases. There is no evidence of honeycombing. Moderate lobular air trapping  on expiratory phase imaging. Findings are not significantly changed when compared to immediate prior examination dated 11/28/2017, however are slightly worsened over time on comparison to multiple prior examinations dating back to at least 01/24/2012. Findings remain indeterminate for UIP and differential considerations include both UIP and chronic fibrosing NSIP.  2.  Coronary artery disease and aortic atherosclerosis.  3.  Hiatal hernia.   Electronically Signed   By: Eddie Candle M.D.   On: 01/15/2019 12:09  ROS - per HPI Recent Labs  Lab 02/23/19 1153  AST 20  ALT 13  ALKPHOS 47  BILITOT 0.3  PROT 6.8  ALBUMIN 4.1     OV 04/08/2019  Subjective:  Patient ID: [name], female , DOB: 1940-11-08 , age 68 y.o. , MRN: [id number] , ADDRESS: [id number] [place] [address]   04/08/2019 -   Chief Complaint  Patient presents with  . Follow-up    Patient reports sob with exertion and a mild cough.     Clinical IPF patient    - strong family hx of ILD (brother died age 66, sister died in her 29s)  - Autoimmune panel 2013 and 2014 is negative and normal   - Pulmonary function test 02/15/2014 postbronchodilator FVC 1.55 L/  59%, total lung capacity 3.05 L/64%, DLCO 14.3/66%  - CT chest  May 2015 - NSIP per radioolgy  - Rx  - - On Esbreit since aug 2014 initially via  expanded access program (Trial participagnt early 2016 for 6 months IPF - Phase 4 study - got esbriet and 6 months of add-on ofev)  - Switched to ofev due to progessive disease mid-oct 2020 (03/24/2019) - started on portable o2 sept 2020   HPI Sherrian Nunnelley [place] 79 y.o. - returns for followup following starting ofev 2 weeks ago. Therapy was changed due to progressive disease. She is tolerating nintedanib fine.  The couple of episodes of mild diarrhea that she had.  One time she had vomiting.  This is following a a family gathering.  Other than that she is tolerating nintedanib just  fine.  In terms of her ILD even though it is progressed compared to last visit in September she feels she is stable.  She is having significant cough.  She says vitamin C helps this.  She is using portable oxygen as needed.  She wants a lighter tank.  She has had overnight oxygen study with the results are still pending this was done a few days ago.  She reminded me about the strong family history of ILD that she has.  Her  brother died at age 59 7 years ago her sister died in her 15s also from ILD.  She is interested in seeing the Retail buyer.  I have sent email to genetics counselor Roma Kayser within our health system.    ROS   OV 04/29/2019  Subjective:  Patient ID: [name], female , DOB: [birth year] , age 63 y.o. , MRN: [id number] , ADDRESS: [id number] [place] [address]   04/29/2019 -   Chief Complaint  Patient presents with  . Follow-up    Pt states she has been doing well since last visit and denies any complaints.    Follow-up idiopathic pulmonary fibrosis progressive now on nintedanib since mid October 2020. HPI Mercie Balsley [place] 79 y.o. -returns for follow-up.  She is returning sooner than anticipated.  I saw her 3 weeks ago.  I wanted her back in 4-6 weeks to see how the nintedanib is going.  Due to scheduling issues she comes back in 3 weeks.  She tells me there are no new issues.  She is losing weight.  She did have interim colonoscopy which she says was normal.  She is unsure why she is losing weight.  I explained to her that this could be because of IPF or could be due to previous pirfenidone or current nintedanib.  At this point in time she wants to take the drugs and continue to monitor his situation.  Last liver function test 3 weeks ago was normal.  She is now on 2 L nasal cannula at night since end of October 2020.  She is also using oxygen with exertion.  She wants a light portable system.  She has called [facility].  She wants our  support in getting a lightweight portable oxygen system.     OV 05/28/2019  Subjective:  Patient ID: [name], female , DOB: 1940/07/29 , age 64 y.o. , MRN: [id number] , ADDRESS: [id number] [place] [address]   05/28/2019 -   Chief Complaint  Patient presents with  . Follow-up    pt reports of sob with exertion. no new concerns or sx.  on 3L at night and 2L during the day PRN.      Follow-up idiopathic pulmonary fibrosis progressive now on nintedanib since mid October 2020.   HPI Iyonnah Ferrante Stamper 79 y.o. - 3L night and 2L night prn in day. Normal plse ox Room air at rest. Tolerting ofev well. No diarrhea. No issue swith appetite. No Nausea, vomitting. Lost some weight 122.4# . She lives < 5 miles Dyspnea is stable. She wants portable o2 - but she says [place] at [facility] office has not called her back. [facility] was supposed to be the provider Though on subjective score below she is worse but she tells me she  Is stable . LFT is normal \   OV 07/16/2019  Subjective:  Patient ID: [name], female , DOB: [birth year] , age 69 y.o. , MRN: [id number] , ADDRESS: [id number] [place] [address]   07/16/2019 -   Chief Complaint  Patient presents with  . Follow-up    Pt states she has been feeling good since last OV. SOB is getting worse. Occ. non productive cough. Pt denies any wheezing, fever, or chills   Follow-up idiopathic pulmonary fibrosis progressive now on nintedanib since mid October 2020. Chronic hypoxemic respiratory failure due to the above  HPI [place] 79 y.o. -presents  for follow-up.  She feels that since her last visit her dyspnea slightly worse.  However on pulmonary function test FVC is stable and DLCO is only a little bit worse.  Her walking desaturation test shows continued stability.  She is pleased about it.  However her symptom score is significantly worse.  Because of the isolation she is not exercising.   There is no chest pain no edema.  Her weight itself is stable.  She was initially losing weight and nintedanib but currently it is stable and is documented below.  She is frustrated by the lack of portable oxygen.  She is saying she is using a heavy tank.  Apparently our office called her home care agency called [facility] but she never heard back.  The medical assistant inquired today and it is likely because of the COVID-19 pandemic portable oxygen systems are not presently available      l OV 10/15/2019  Subjective:  Patient ID: [name], female , DOB: 02-21-1941 , age 35 y.o. , MRN: [id number] , ADDRESS: [id number] [place] [address]   10/15/2019 -   Chief Complaint  Patient presents with  . Follow-up    Patient reports that she's having to use her oxygen more during the day.     Follow-up idiopathic pulmonary fibrosis progressive now on nintedanib since mid October 2020. Chronic hypoxemic respiratory failure due to the above Hiatal hernia on CT 2020 Normal stress test oct 2019  HPI Sherial Ebrahim [place] 79 y.o. -presents for her 79-monthfollow-up.  She continues on nintedanib she continues to have some diarrhea.  She feels her shortness of breath is getting worse.  She is using more oxygen in the daytime but in talking to her it seems the same as of February 2021.  Her weight is also the same since February 2021.  Subjective dyspnea score is unchanged compared to February 2021 even though she is feeling worse in her history.  Her walking desaturation test is similar to February 2021 even though she feels worse.  She is supposed have pulmonary function test today but for some reason it was not scheduled perhaps due to the pandemic and disruptions of the PFT lab.  Review of the records indicate that last liver function test was early part of 2021.  Review of records also indicate the 2019 echocardiogram shows pulmonary systolic pressure of 63 mmHg along with grade 1  diastolic dysfunction.  She does not have significant edema.  She is going to turn 79 years in J06-Jul-2024  Her goal is to live past 79.  She is extremely worried that she will might die before J07-06-21  Both her siblings died early from pulmonary fibrosis.  She is not aware that between her last visit in this visit inhaled treprostinil has been approved for pulmonary hypertension related to pulmonary fibrosis     OV 12/31/2019   Subjective:  Patient ID: NRutherford Limerick female , DOB: [birth year] age 79y.o. years. , MRN: [id number]  ADDRESS[address] [id number]  CPleas Koch NP Providers : Treatment Team:  Attending Provider: RBrand Males MD   Chief Complaint  Patient presents with  . Follow-up    pt reports of sob with exertion.    Follow-up idiopathic pulmonary fibrosis progressive now on nintedanib since mid October 2020. Chronic hypoxemic respiratory failure due to the above Hiatal hernia on CT 2020 Normal stress test oct 2019 Severe pulmonary  hypertension diagnosed on right heart catheterization June 2021.    HPI Ryian Lynde Grochowski 79 y.o. -presents for follow-up.  Her weight was between 122 -123 pounds a couple months ago.  Now with 115 pounds.  She believes is because of the pandemic related disruptions.  She also thinks the nintedanib and diarrhea because this weight loss.  She says in the interim on November 19, 2019 she underwent right heart catheterization that showed severe pulmonary hypertension.  And then November 29, 2018 when she ended up with pseudoaneurysm and hematoma.  She was hospitalized here in [place].  After that she was discharged to the nursing home and she just got back a few weeks ago.  She says all this is taken a toll on her weight.  She is now back on the nintedanib for week or 2 or more.  She is tolerating it well.  There is no nausea vomiting.  There is some diarrhea scored 2.  But she says it is actually better.   Nevertheless_she did appears the same.  She says all the recent hospitalization is upset that her emotionally.  Her anxiety and depression scores are slightly higher.  Before she never had it.  She says it is improving.  The medical assistant walked her for routine desaturation.  This time she required 4  liters to correct and even that barely.  We discussed declining functional status and goals of care.  She says she has a living will.  She did not elaborate the content of it.  Advised her DO NOT RESUSCITATE DO NOT INTUBATE she took notified.  She seemed a little upset about the directionality of her disease.  She did ask about life expectancy.  At room air at rest she is normoxic.  Therefore said it could be few months or several months but I was really worried about the loss of weight in the worsening hypoxemia.  She believes the loss of weight is temporally related to recent admission.  She believes she can reset the weight loss by working on it with nutritional changes.  She currently wants to maintain status quo as opposed to calling palliative care.  Also because of the tremendous emotional turmoil with the recent complication of right heart catheterization she is not ready to start inhaled treprostinil.  She does understand that she meets criteria for it. .    November 19, 2019 Elevated right heart pressures with significantly increased transpulmonic gradient with severe pulmonary arterial hypertension with PA pressure 68/31 and mean pressure 46 mmHg. PVR: 5.9 WU (Fick) and 7.1 WU (Thermo) .  Pressures RA: A-wave 7, V wave 6, mean 2 RV: 62/4 PA: 68/31; mean 46 PW: A-wave 7, V wave 8, mean 6       SYMPTOM SCALE - ILD 04/08/2019 126# 04/29/2019 123# 05/28/2019 122# 07/16/2019 123# 10/15/2019 122.6# 12/31/2019 115#  O2 use Portable o2 as needed       Shortness of Breath 0 -> 5 scale with 5 being worst (score 6 If unable to do)    uses 3 L with exertion and 2-1/2 L at night  3 L at night and 2-1/2  with exertion and at rest in the daytime  required 4 L nasal cannula to correct  At rest _0 Simple tasks - showers, clothes change, eating, shaving _1 Household (dishes, doing bed, laundry) _2 3._3 Shopping _4 3  Walking level at own pace _0 Walking up Stairs _1 Total (40 - 48) Dyspnea Score 19   24._2 How bad is your cough? _3 How bad is your fatigue _4 nauea     1 0  vomit     0 0  diarrhea     2 2  anxiety     0 3  depression     0 2       Simple office walk 185 feet x  3 laps goal with forehead probe 04/08/2019  04/29/2019  07/16/2019  10/15/2019  12/31/2019   O2 used RA RA RA bRL office RA brl office ra  Number laps completed 3   3  Did oly 2 of 3 laps desatured in 3/4th of 1 lap  Comments about pace - stopped at 2 Sopped at end of 2nd lap     Resting Pulse Ox/HR 96% and 104/min 95% and HR 80.min 94% and HR 107/min 96% and HR 80./min 94% RA ->   Final Pulse Ox/HR 82% and 120/min 85% and 108/min  87% and HR 132.min 86% and HR 115.min 86% with jsue 3/4th of 1 laps ad HR 86  Desaturated </= 88% yes yes yes yes   Desaturated <= 3% points yes yes yes yes   Got Tachycardic >/= 90/min yes yes yes yes   Symptoms at end of test Very dyspneice Moderate dyspnea Moderate dyspnea Very dyspneic   Miscellaneous comments Started ofev mid oct 2020  Recovered quickly Took few minutes to recover Needed 4LNC pulse ox to correct to 85-89%       Results for EARLYN, SYLVAN (MRN [id number]) as of 04/08/2019 11:19  Ref. Range 07/15/2014 11:47 01/03/2015 09:40 03/17/2015 13:03 10/23/2015 08:45 03/12/2019 16:47 - startr ofev due to progerssiv diseae 07/16/2019   FVC-Pre Latest Units: L 1.59 1.52 1.47 1.44 1.08 1.08  FVC-%Pred-Pre Latest Units: % 61 59 57 56 44   Results for SONAL, DORWART (MRN [id number]) as of 04/08/2019 11:19  Ref. Range 02/15/2014 11:51 07/15/2014 11:47 01/03/2015 09:40 03/17/2015 13:03 10/23/2015 08:45  03/12/2019 16:47 - start ofev 07/16/2019   DLCO unc Latest Units: ml/min/mmHg 14.30 13.65 13.66 12.11 12.65 8.71 8.57  DLCO unc % pred Latest Units: % 66 63 63 56 58 49 47%   ROS - per HPI  ECHO Nov 2019  Study Conclusions   - Left ventricle: The cavity size was normal. Wall thickness was  increased in a pattern of mild LVH. Systolic function was  vigorous. The estimated ejection fraction was in the range of 65%  to 70%. Wall motion was normal; there were no regional wall  motion abnormalities. Doppler parameters are consistent with  abnormal left ventricular relaxation (grade 1 diastolic  dysfunction). Doppler parameters are consistent with  indeterminate ventricular filling pressure.  - Aortic valve: Mildly calcified annulus. Trileaflet.  - Mitral valve: Mildly calcified annulus.  - Left atrium: The atrium was mildly dilated.  - Atrial septum: No defect or patent foramen ovale was identified.  - Tricuspid valve: There was moderate regurgitation.  - Pulmonary arteries: PA peak pressure: 63 mm Hg (S).  - Pericardium, extracardiac: A trivial pericardial effusion was  identified.      ROS - per HPI     has a past medical history of Anginal pain ([place]), Cancer ([place]), CHF (congestive  heart failure) ([place]) (01/22/2012), Chronic fatigue (07/15/2014), CVA (cerebral vascular accident) ([place]) (04/18/2018), Family history of pulmonary fibrosis, GERD (gastroesophageal reflux disease) (05/12/2017), Hypertension, Hypothyroidism, ILD (interstitial lung disease) ([place]) (02/04/2012), IPF (idiopathic pulmonary fibrosis) ([place]) (2013), Mild intermittent asthma (01/29/2016), Pelvic relaxation, Prolapse of bladder, Shortness of breath, and Thyroid disease.   reports that she has never smoked. She has never used smokeless tobacco.  Past Surgical History:  Procedure Laterality Date  . ANTERIOR AND POSTERIOR REPAIR  12/05/2011   Procedure: ANTERIOR (CYSTOCELE) AND POSTERIOR REPAIR (RECTOCELE);   Surgeon: [name], MD;  Location: [facility];  Service: Gynecology;  Laterality: N/A;  with cysto  . Bladder tact  15 years ago  . COLONOSCOPY WITH PROPOFOL N/A 04/20/2019   Procedure: COLONOSCOPY WITH PROPOFOL;  Surgeon: [name], MD;  Location[facility] ENDOSCOPY;  Service: Gastroenterology;  Laterality: N/A;  . LEFT HEART CATHETERIZATION WITH CORONARY ANGIOGRAM N/A 01/22/2012   Procedure: LEFT HEART CATHETERIZATION WITH CORONARY ANGIOGRAM;  Surgeon: [name], MD;  Location: [facility] CATH LAB;  Service: Cardiovascular;  Laterality: N/A;  . NECK SURGERY  2010  . PSEUDOANERYSM COMPRESSION Right 11/29/2019   Procedure: PSEUDOANERYSM COMPRESSION;  Surgeon: Algernon Huxley, MD;  Location: [place] [facility] LAB;  Service: Cardiovascular;  Laterality: Right;  . RIGHT HEART CATH N/A 11/19/2019   Procedure: RIGHT HEART CATH;  Surgeon: [name], MD;  Location: [place] [facility];  Service: Cardiovascular;  Laterality: N/A;  . SHOULDER SURGERY    . thyroid disease    . THYROID LOBECTOMY Left 07/17/2018   Procedure: LEFT THYROID LOBECTOMY;  Surgeon: [name], MD;  Location: [facility];  Service: General;  Laterality: Left;  . TONSILLECTOMY    . VAGINAL HYSTERECTOMY  15 years ago    Allergies  Allergen Reactions  . Crestor [Rosuvastatin Calcium] Other (See Comments)    Myalgias, muscle weakness  . Dilaudid [Hydromorphone] Nausea And Vomiting    Immunization History  Administered Date(s) Administered  . Influenza Split 02/22/2012, 03/01/2013, 02/08/2014, 01/27/2015  . Influenza Whole 04/11/2011, 02/10/2017  . Influenza, High Dose Seasonal PF 02/16/2017, 01/20/2019  . Influenza,inj,Quad PF,6+ Mos 03/11/2016  . Influenza-Unspecified 01/26/2018  . PFIZER SARS-COV-2 Vaccination 06/30/2019, 07/21/2019  . Pneumococcal Conjugate-13 03/29/2014  . Pneumococcal Polysaccharide-23 12/08/2017  . Pneumococcal-Unspecified 04/11/2011, 05/13/2016  . Td 08/30/2008  . Tdap 11/16/2017  . Zoster 08/07/2011    . Zoster Recombinat (Shingrix) 09/20/2016, 02/19/2017    Family History  Problem Relation Age of Onset  . Thyroid disease Mother   . Hypertension Mother   . Coronary artery disease Mother   . Dementia Mother   . Stroke Mother   . Aneurysm Father   . Pulmonary fibrosis Sister   . Stroke Other   . Sleep apnea Other   . Liver disease Brother   . Pulmonary fibrosis Brother   . Stroke Maternal Grandmother   . Dementia Maternal Grandmother   . Heart attack Maternal Grandfather 67  . COPD Son      Current Outpatient Medications:  .  acetaminophen (TYLENOL) 500 MG tablet, Take 1,000 mg by mouth every 6 (six) hours as needed for mild pain or headache. , Disp: , Rfl:  .  amLODipine (NORVASC) 5 MG tablet, TAKE 1 TABLET BY MOUTH EVERY DAY FOR BLOOD PRESSURE (Patient taking differently: Take 5 mg by mouth daily. ), Disp: 90 tablet, Rfl: 1 .  atorvastatin (LIPITOR) 40 MG tablet, TAKE 1 TABLET (40 MG TOTAL) BY MOUTH AT BEDTIME. FOR CHOLESTEROL., Disp: 90 tablet,  Rfl: 0 .  CALCIUM CARBONATE-VITAMIN D PO, Take 1 tablet by mouth daily. , Disp: , Rfl:  .  clopidogrel (PLAVIX) 75 MG tablet, TAKE 1 TABLET (75 MG TOTAL) BY MOUTH DAILY. FOR STROKE PREVENTION., Disp: 90 tablet, Rfl: 1 .  CRANBERRY PO, Take 1 capsule by mouth every Monday, Wednesday, and Friday. , Disp: , Rfl:  .  ferrous sulfate 325 (65 FE) MG tablet, Take 325 mg by mouth every evening. , Disp: , Rfl:  .  levothyroxine (SYNTHROID) 100 MCG tablet, TAKE 1 TABLET BY MOUTH EVERY MORNING ON AN EMPTY STOMACH. NO FOOD OR OTHER MEDICATIONS FOR 30 MIN (Patient taking differently: Take 100 mcg by mouth daily before breakfast. AN EMPTY STOMACH. NO FOOD OR OTHER MEDICATIONS FOR 30 MIN), Disp: 90 tablet, Rfl: 1 .  loratadine (CLARITIN) 10 MG tablet, Take 10 mg by mouth daily. , Disp: , Rfl:  .  metoprolol succinate (TOPROL-XL) 25 MG 24 hr tablet, Take 1 tablet (25 mg total) by mouth daily., Disp: 30 tablet, Rfl: 11 .  Multiple Vitamins-Minerals  (CENTRUM SILVER PO), Take 1 tablet by mouth daily., Disp: , Rfl:  .  OFEV 150 MG CAPS, Take 150 mg by mouth every 12 (twelve) hours. As Directed, Disp: , Rfl:  .  omeprazole (PRILOSEC) 20 MG capsule, TAKE 1 CAPSULE BY MOUTH EVERY DAY (Patient taking differently: Take 20 mg by mouth daily. ), Disp: 90 capsule, Rfl: 1      Objective:   Vitals:   12/31/19 1627 12/31/19 1629 12/31/19 1630  BP: 112/70  112/70  Pulse: (!) 115  (!) 115  Temp: (!) 97.3 F (36.3 C)  (!) 97.3 F (36.3 C)  TempSrc: Temporal  Temporal  SpO2: 98% 98% 98%  Weight: 115 lb 9.6 oz (52.4 kg)  115 lb (52.2 kg)  Height: 5' 2.5" (1.588 m)  5' 2.5" (1.588 m)    Estimated body mass index is 20.7 kg/m as calculated from the following:   Height as of this encounter: 5' 2.5" (1.588 m).   Weight as of this encounter: 115 lb (52.2 kg).  _0 @  Filed Weights   12/31/19 1627 12/31/19 1630  Weight: 115 lb 9.6 oz (52.4 kg) 115 lb (52.2 kg)     Physical Exam frail female who drove herself here but sitting on the wheelchair bilateral bibasal crackles no edema.  No elevated JVP.  Normal heart sounds.  Pleasant looks a little more deconditioned.       Assessment:       ICD-10-CM   1. 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 Chronic respiratory failure with hypoxia (HCC)  J96.11   4. Weight loss  R63.4   5. Pulmonary HTN ([place])  I27.20        Plan:     Patient Instructions     ICD-10-CM   1. 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 Chronic respiratory failure with hypoxia (HCC)  J96.11   4. Weight loss  R63.4   5. Pulmonary HTN (HCC)  I27.20      I am really concerned that he had progressive pulmonary fibrosis  This is in the setting of severe pulmonary hypertension You are also having progressive weight loss I  am extremely concerned that the pace of progression with weight loss and oxygen requirements  is making the outlook on your life diminished  Course has been complicated by pseudoaneurysm from a right heart catheterization  Plan based on shared decision making -Continue oxygen but you need 4-5 L with exertion -Continue Ofev as scheduled -I advised DO NOT INTUBATE and DO NOT RESUSCITATE in case you end up with the hospital or EMS is called for any reason -you will think about this -I have advised that you have conversations with the family about progression in pulmonary fibrosis -He will think about whether he wanted to inhaled treprostinil or not -We will monitor your weight once a week at house and you will work on gaining weight by itself (I agree it is possible that the pandemic and disruptions from that have contributed to weight loss but I am also worried inside of the disease on nintedanib]  Follow-up -4 weeks telephone visit with nurse practitioner to review symptoms and goals of care -particularly if he wanted to inhaled treprostinil or not  -8 weeks with Dr. [name] in [place] [facility] with 30-minute slot   ( Level 05 visit: Estb 40-54 min  in  visit type: on-site physical face to visit  in total care time and counseling or/and coordination of care by this undersigned MD - Dr Brand Males. This includes one or more of the following on this same day 12/31/2019: pre-charting, chart review, note writing, documentation discussion of test results, diagnostic or treatment recommendations, prognosis, risks and benefits of management options, instructions, education, compliance or risk-factor reduction. It excludes time spent by the [place] or office staff in the care of the patient. Actual time 68 min)   SIGNATURE    Dr. Brand Males, M.D., F.C.C.P,  Pulmonary and Critical Care Medicine Staff Physician, [place] Director - Interstitial Lung Disease  Program    [facility] [place] [facility] [place], [place], [address]  Pager: [phone], If no answer or between  15:00h - 7:00h: call [phone] Telephone: [phone]  5:40 PM 12/31/2019

## 2019-12-31 NOTE — Patient Instructions (Addendum)
ICD-10-CM   1. 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 Chronic respiratory failure with hypoxia (HCC)  J96.11   4. Weight loss  R63.4   5. Pulmonary HTN (HCC)  I27.20      I am really concerned that he had progressive pulmonary fibrosis  This is in the setting of severe pulmonary hypertension You are also having progressive weight loss I am extremely concerned that the pace of progression with weight loss and oxygen requirements is making the outlook on your life diminished  Course has been complicated by pseudoaneurysm from a right heart catheterization  Plan based on shared decision making -Continue oxygen but you need 4-5 L with exertion -Continue Ofev as scheduled -I advised DO NOT INTUBATE and DO NOT RESUSCITATE in case you end up with the hospital or EMS is called for any reason -you will think about this -I have advised that you have conversations with the family about progression in pulmonary fibrosis -He will think about whether he wanted to inhaled treprostinil or not -We will monitor your weight once a week at house and you will work on gaining weight by itself (I agree it is possible that the pandemic and disruptions from that have contributed to weight loss but I am also worried inside of the disease on nintedanib]  Follow-up -4 weeks telephone visit with nurse practitioner to review symptoms and goals of care -particularly if he wanted to inhaled treprostinil or not  -8 weeks with Dr. [name] in [place] clinic with 30-minute slot

## 2020-01-07 ENCOUNTER — Other Ambulatory Visit: Payer: Self-pay | Admitting: Cardiology

## 2020-01-07 DIAGNOSIS — R000 Tachycardia, unspecified: Secondary | ICD-10-CM

## 2020-01-10 ENCOUNTER — Other Ambulatory Visit (INDEPENDENT_AMBULATORY_CARE_PROVIDER_SITE_OTHER): Payer: Self-pay | Admitting: Nurse Practitioner

## 2020-01-10 DIAGNOSIS — T148XXA Other injury of unspecified body region, initial encounter: Secondary | ICD-10-CM

## 2020-01-10 DIAGNOSIS — I724 Aneurysm of artery of lower extremity: Secondary | ICD-10-CM

## 2020-01-11 ENCOUNTER — Other Ambulatory Visit: Payer: Self-pay

## 2020-01-11 ENCOUNTER — Ambulatory Visit (INDEPENDENT_AMBULATORY_CARE_PROVIDER_SITE_OTHER): Payer: Medicare PPO

## 2020-01-11 ENCOUNTER — Encounter (INDEPENDENT_AMBULATORY_CARE_PROVIDER_SITE_OTHER): Payer: Self-pay | Admitting: Nurse Practitioner

## 2020-01-11 ENCOUNTER — Ambulatory Visit (INDEPENDENT_AMBULATORY_CARE_PROVIDER_SITE_OTHER): Payer: Medicare PPO | Admitting: Nurse Practitioner

## 2020-01-11 VITALS — BP 125/74 | HR 111 | Resp 16 | Ht 62.5 in | Wt 119.0 lb

## 2020-01-11 DIAGNOSIS — I724 Aneurysm of artery of lower extremity: Secondary | ICD-10-CM | POA: Diagnosis not present

## 2020-01-11 DIAGNOSIS — T81718A Complication of other artery following a procedure, not elsewhere classified, initial encounter: Secondary | ICD-10-CM

## 2020-01-11 DIAGNOSIS — T148XXA Other injury of unspecified body region, initial encounter: Secondary | ICD-10-CM

## 2020-01-11 DIAGNOSIS — I729 Aneurysm of unspecified site: Secondary | ICD-10-CM

## 2020-01-11 DIAGNOSIS — I10 Essential (primary) hypertension: Secondary | ICD-10-CM | POA: Diagnosis not present

## 2020-01-11 DIAGNOSIS — E785 Hyperlipidemia, unspecified: Secondary | ICD-10-CM | POA: Diagnosis not present

## 2020-01-11 NOTE — Progress Notes (Signed)
Subjective:    Patient ID: Tina Calderon, female    DOB: [birth year], 79 y.o.   MRN: [id number] Chief Complaint  Patient presents with  . Follow-up    4wk pseudoaneurysm    Patient presents today after pseudoaneurysm following right heart catheterization on 11/18/2009.  The patient underwent thrombin injection on 11/29/2019.  Initially following the procedure the patient did have decreased pain however it was still significant.  Today, the patient reports that the pain is completely resolved.  The patient has also returned home from her rehab facility as well.  The patient denies feeling any knots or tenderness especially when she is showering.  She is able to ambulate and go about her daily activities without any difficulty at all.  The bruising has resolved.  Overall the patient feels much better than at her previous office visit  Today noninvasive studies show no evidence of pseudoaneurysm seen within her right groin area.  The residual hematoma is 3 cm in size with no flow.  Compared to the previous study on 12/14/2019, the hematoma has decreased from 8 cm.  The pseudoaneurysm has also resolved compared to previous studies.      Review of Systems  Cardiovascular: Negative for leg swelling.  Musculoskeletal: Negative for myalgias.  All other systems reviewed and are negative.      Objective:   Physical Exam Vitals reviewed.  HENT:     Head: Normocephalic.  Cardiovascular:     Rate and Rhythm: Normal rate and regular rhythm.     Pulses: Normal pulses.  Pulmonary:     Effort: Pulmonary effort is normal.     Comments: Home O2 Skin:    General: Skin is warm and dry.  Neurological:     Mental Status: She is alert and oriented to person, place, and time.  Psychiatric:        Mood and Affect: Mood normal.        Behavior: Behavior normal.        Thought Content: Thought content normal.        Judgment: Judgment normal.     BP 125/74 (BP Location: Left Arm)   Pulse (!)  111   Resp 16   Ht 5' 2.5" (1.588 m)   Wt 119 lb (54 kg)   BMI 21.42 kg/m   Past Medical History:  Diagnosis Date  . Anginal pain ([place])   . Cancer ([place])   . CHF (congestive heart failure) ([place]) 01/22/2012  . Chronic fatigue 07/15/2014  . CVA (cerebral vascular accident) ([place]) 04/18/2018  . Family history of pulmonary fibrosis   . GERD (gastroesophageal reflux disease) 05/12/2017  . Hypertension   . Hypothyroidism   . ILD (interstitial lung disease) ([place]) 02/04/2012  . IPF (idiopathic pulmonary fibrosis) ([place]) 2013  . Mild intermittent asthma 01/29/2016  . Pelvic relaxation   . Prolapse of bladder   . Shortness of breath   . Thyroid disease     Social History   Socioeconomic History  . Marital status: Married    Spouse name: Not on file  . Number of children: 2  . Years of education: Not on file  . Highest education level: Not on file  Occupational History  . Not on file  Tobacco Use  . Smoking status: Never Smoker  . Smokeless tobacco: Never Used  Vaping Use  . Vaping Use: Never used  Substance and Sexual Activity  . Alcohol use: No  . Drug use: No  . Sexual activity:  Never    Birth control/protection: Post-menopausal, Surgical    Comment: HYST  Other Topics Concern  . Not on file  Social History Narrative   Married.   2 children, 5 grandchildren.   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   Enjoys puzzles, sewing, traveling, reading.    Social Determinants of Health   Financial Resource Strain:   . Difficulty of Paying Living Expenses:   Food Insecurity:   . Worried About Charity fundraiser in the Last Year:   . Arboriculturist in the Last Year:   Transportation Needs:   . Film/video editor (Medical):   [name] Lack of Transportation (Non-Medical):   Physical Activity:   . Days of Exercise per Week:   . Minutes of Exercise per Session:   Stress:   . Feeling of Stress :   Social Connections:   . Frequency of Communication with Friends and Family:   .  Frequency of Social Gatherings with Friends and Family:   . Attends Religious Services:   . Active Member of Clubs or Organizations:   . Attends Archivist Meetings:   [name] Marital Status:   Intimate Partner Violence:   . Fear of Current or Ex-Partner:   . Emotionally Abused:   [name] Physically Abused:   . Sexually Abused:     Past Surgical History:  Procedure Laterality Date  . ANTERIOR AND POSTERIOR REPAIR  12/05/2011   Procedure: ANTERIOR (CYSTOCELE) AND POSTERIOR REPAIR (RECTOCELE);  Surgeon: [name], MD;  Location: [facility];  Service: Gynecology;  Laterality: N/A;  with cysto  . Bladder tact  15 years ago  . COLONOSCOPY WITH PROPOFOL N/A 04/20/2019   Procedure: COLONOSCOPY WITH PROPOFOL;  Surgeon: [name], MD;  Location: [facility] ENDOSCOPY;  Service: Gastroenterology;  Laterality: N/A;  . LEFT HEART CATHETERIZATION WITH CORONARY ANGIOGRAM N/A 01/22/2012   Procedure: LEFT HEART CATHETERIZATION WITH CORONARY ANGIOGRAM;  Surgeon: [name], MD;  Location: [facility] CATH LAB;  Service: Cardiovascular;  Laterality: N/A;  . NECK SURGERY  2010  . PSEUDOANERYSM COMPRESSION Right 11/29/2019   Procedure: PSEUDOANERYSM COMPRESSION;  Surgeon: Algernon Huxley, MD;  Location: [place] CV LAB;  Service: Cardiovascular;  Laterality: Right;  . RIGHT HEART CATH N/A 11/19/2019   Procedure: RIGHT HEART CATH;  Surgeon: [name], MD;  Location: [place] [facility] LAB;  Service: Cardiovascular;  Laterality: N/A;  . SHOULDER SURGERY    . thyroid disease    . THYROID LOBECTOMY Left 07/17/2018   Procedure: LEFT THYROID LOBECTOMY;  Surgeon: [name], MD;  Location: [facility];  Service: General;  Laterality: Left;  . TONSILLECTOMY    . VAGINAL HYSTERECTOMY  15 years ago    Family History  Problem Relation Age of Onset  . Thyroid disease Mother   . Hypertension Mother   . Coronary artery disease Mother   . Dementia Mother   . Stroke Mother   . Aneurysm Father   . Pulmonary fibrosis Sister    . Stroke Other   . Sleep apnea Other   . Liver disease Brother   . Pulmonary fibrosis Brother   . Stroke Maternal Grandmother   . Dementia Maternal Grandmother   . Heart attack Maternal Grandfather 67  . COPD Son     Allergies  Allergen Reactions  . Crestor [Rosuvastatin Calcium] Other (See Comments)    Myalgias, muscle weakness  . Dilaudid [Hydromorphone] Nausea And Vomiting       Assessment & Plan:  1. Pseudoaneurysm following procedure ([place]) Today noninvasive studies show that the patient's pseudoaneurysm has completely resolved.  The residual hematoma has mostly resolved and is very nearly gone.  The patient no longer has any pain or discomfort.  Based on this we will follow with the patient on an as-needed basis.  Discussed with the patient the natural cause or progression of hematoma/pseudoaneurysm.  Patient is advised to contact her office if other questions arise.  2. Essential hypertension Continue antihypertensive medications as already ordered, these medications have been reviewed and there are no changes at this time.   3. Dyslipidemia, goal LDL below 70 Continue statin as ordered and reviewed, no changes at this time    Current Outpatient Medications on File Prior to Visit  Medication Sig Dispense Refill  . acetaminophen (TYLENOL) 500 MG tablet Take 1,000 mg by mouth every 6 (six) hours as needed for mild pain or headache.     [name] amLODipine (NORVASC) 5 MG tablet TAKE 1 TABLET BY MOUTH EVERY DAY FOR BLOOD PRESSURE (Patient taking differently: Take 5 mg by mouth daily. ) 90 tablet 1  . atorvastatin (LIPITOR) 40 MG tablet TAKE 1 TABLET (40 MG TOTAL) BY MOUTH AT BEDTIME. FOR CHOLESTEROL. 90 tablet 0  . CALCIUM CARBONATE-VITAMIN D PO Take 1 tablet by mouth daily.     . clopidogrel (PLAVIX) 75 MG tablet TAKE 1 TABLET (75 MG TOTAL) BY MOUTH DAILY. FOR STROKE PREVENTION. 90 tablet 1  . CRANBERRY PO Take 1 capsule by mouth every Monday, Wednesday, and Friday.     .  ferrous sulfate 325 (65 FE) MG tablet Take 325 mg by mouth every evening.     [name] levothyroxine (SYNTHROID) 100 MCG tablet TAKE 1 TABLET BY MOUTH EVERY MORNING ON AN EMPTY STOMACH. NO FOOD OR OTHER MEDICATIONS FOR 30 MIN (Patient taking differently: Take 100 mcg by mouth daily before breakfast. AN EMPTY STOMACH. NO FOOD OR OTHER MEDICATIONS FOR 30 MIN) 90 tablet 1  . loratadine (CLARITIN) 10 MG tablet Take 10 mg by mouth daily.     . metoprolol succinate (TOPROL-XL) 25 MG 24 hr tablet TAKE 1 TABLET BY MOUTH EVERY DAY 90 tablet 3  . Multiple Vitamins-Minerals (CENTRUM SILVER PO) Take 1 tablet by mouth daily.    [name] OFEV 150 MG CAPS Take 150 mg by mouth every 12 (twelve) hours. As Directed    . omeprazole (PRILOSEC) 20 MG capsule TAKE 1 CAPSULE BY MOUTH EVERY DAY (Patient taking differently: Take 20 mg by mouth daily. ) 90 capsule 1   No current facility-administered medications on file prior to visit.    There are no Patient Instructions on file for this visit. No follow-ups on file.   Kris Hartmann, NP

## 2020-01-18 ENCOUNTER — Other Ambulatory Visit: Payer: Self-pay | Admitting: Primary Care

## 2020-01-18 DIAGNOSIS — Z1231 Encounter for screening mammogram for malignant neoplasm of breast: Secondary | ICD-10-CM

## 2020-01-28 ENCOUNTER — Encounter: Payer: Self-pay | Admitting: Pulmonary Disease

## 2020-01-28 ENCOUNTER — Ambulatory Visit (INDEPENDENT_AMBULATORY_CARE_PROVIDER_SITE_OTHER): Payer: Medicare PPO | Admitting: Pulmonary Disease

## 2020-01-28 ENCOUNTER — Other Ambulatory Visit: Payer: Self-pay

## 2020-01-28 DIAGNOSIS — I272 Pulmonary hypertension, unspecified: Secondary | ICD-10-CM

## 2020-01-28 DIAGNOSIS — J84112 Idiopathic pulmonary fibrosis: Secondary | ICD-10-CM | POA: Diagnosis not present

## 2020-01-28 DIAGNOSIS — J9611 Chronic respiratory failure with hypoxia: Secondary | ICD-10-CM | POA: Diagnosis not present

## 2020-01-28 DIAGNOSIS — Z79899 Other long term (current) drug therapy: Secondary | ICD-10-CM | POA: Diagnosis not present

## 2020-01-28 DIAGNOSIS — Z7189 Other specified counseling: Secondary | ICD-10-CM

## 2020-01-28 NOTE — Assessment & Plan Note (Signed)
Plan: Continue oxygen therapy at this time

## 2020-01-28 NOTE — Patient Instructions (Addendum)
You were seen today by Lauraine Rinne, NP  for:   1. IPF (idiopathic pulmonary fibrosis) / IPAF  Continue Ofev  Continue oxygen therapy  Continue follow-up with Dr. [name] in 4 weeks in [place] [facility]  2. Chronic respiratory failure with hypoxia (HCC)  Continue oxygen therapy as prescribed  >>>maintain oxygen saturations greater than 88 percent  >>>if unable to maintain oxygen saturations please contact the office  >>>do not smoke with oxygen  >>>can use nasal saline gel or nasal saline rinses to moisturize nose if oxygen causes dryness  3. Pulmonary HTN ([place]) 4. Medication management  We discussed starting inhaled Tyvaso to help with your pulmonary hypertension  You declined this today  If you become interested in starting Tyvaso or would like to speak with the pharmacy team please contact her office and let us know  5. Goals of care, counseling/discussion  We discussed today that you would like to be a DO NOT INTUBATE as well as a DO NOT RESUSCITATE  At next appointment with primary care or our office please notify them so you can sign the complete forms  Follow Up:    Return in about 4 weeks (around 02/25/2020), or if symptoms worsen or fail to improve, for  , Follow up with Dr. Purnell Shoemaker, ILD clinic - 46mn slot.   Please do your part to reduce the spread of COVID-19:      Reduce your risk of any infection  and COVID19 by using the similar precautions used for avoiding the common cold or flu:  .Marland KitchenWash your hands often with soap and warm water for at least 20 seconds.  If soap and water are not readily available, use an alcohol-based hand sanitizer with at least 60% alcohol.  . If coughing or sneezing, cover your mouth and nose by coughing or sneezing into the elbow areas of your shirt or coat, into a tissue or into your sleeve (not your hands). .Langley GaussA MASK when in public  . Avoid shaking hands with others and consider head nods or verbal greetings  only. . Avoid touching your eyes, nose, or mouth with unwashed hands.  . Avoid close contact with people who are sick. . Avoid places or events with large numbers of people in one location, like concerts or sporting events. . If you have some symptoms but not all symptoms, continue to monitor at home and seek medical attention if your symptoms worsen. . If you are having a medical emergency, call 911.   [place]/ e-Visit: [email or web address]        [facility] [phone]                  [facility] [phone]    It is flu season:   >>> Best ways to protect herself from the flu: Receive the yearly flu vaccine, practice good hand hygiene washing with soap and also using hand sanitizer when available, eat a nutritious meals, get adequate rest, hydrate appropriately   Please contact the office if your symptoms worsen or you have concerns that you are not improving.   Thank you for choosing [facility] for your healthcare, and for allowing [facility] partner with you on your healthcare journey. I am thankful to be able to provide care to you today.   BWyn QuakerFNP-C

## 2020-01-28 NOTE — Assessment & Plan Note (Signed)
Discussed Tyvaso start Patient is not interested in starting Tyvaso at this time Patient reports she will continue to think about this Offered pharmacy team appointment to further discuss, patient declined  Plan: We can review in 4 weeks with next appointment with Dr. [name]

## 2020-01-28 NOTE — Assessment & Plan Note (Signed)
Severe pulmonary hypertension in the setting of interstitial lung disease/IPF  Discussion: Offered Tyvaso start for patient, she is not interested in doing this at this time.  Offered appointment with clinical pharmacy team to help further answer questions about starting inhaled Tyvaso.  Patient declines this at this time.  She reports that she will continue to think about it.  Plan: Continue follow-up with our team. If weights are trending up please let our office know 4-week follow-up with Dr. [name] in [place] clinic in person Explained to patient that if she becomes interested in starting Tyvaso she can contact our office to schedule an appointment with the pharmacy team or discussed with Dr. [name] at next visit

## 2020-01-28 NOTE — Progress Notes (Signed)
Virtual Visit via Telephone Note  I connected with Tina Calderon on 01/28/20 at 11:00 AM EDT by telephone and verified that I am speaking with the correct person using two identifiers.  Location: Patient: Home Provider: Office - [facility] - [address], [address], [place], [place] [address]   I discussed the limitations, risks, security and privacy concerns of performing an evaluation and management service by telephone and the availability of in person appointments. I also discussed with the patient that there may be a patient responsible charge related to this service. The patient expressed understanding and agreed to proceed.  Patient consented to consult via telephone: Yes People present and their role in pt care: Pt     History of Present Illness:  79-year-old female never smoker followed in our office for IPF on esbriet. Negative CTD workup in 2014. Family history of pulmonary fibrosis (sibling)  PMH: Hypothyroidism, anemia, chronic fatigue, hypertension, GERD, prediabetes, history of CVA, chronic diastolic heart failure Smoker/ Smoking History: Never smoker Maintenance:  OFEV Pt of: Dr. Ramaswamy    Chief complaint: ILD    79-year-old female followed in our office for IPF.  Followed by Dr. Ramaswamy.  Last appointment with Dr. Ramaswamy was July/2021.  There were concerns at that point in time the patient was having progression and pulmonary fibrosis especially in the setting of severe pulmonary hypertension.  She is having progressive weight loss.  Plan of care was for her to continue oxygen therapy to 4 to 5 L with exertion, continue Ofev.  It was recommended the patient be a DNI and DNR in case she goes to the hospital.  She will think about this.  It was offered that we could consider inhaled Tyvaso.  A 4-week phone visit was set up.  As well as an 8-week visit with Dr. Ramaswamy in the [place] clinic.  Patient reports that she has not had any changes since  her last visit.  Continues to be maintained on oxygen at 4 to 5 L.  Oxygen levels today are 90% with a heart rate of 80.  She continues to take Ofev.  She is not interested in starting Tyvaso at this time.  We will discuss and evaluate this today.  Observations/Objective:  11/19/19 -RHC  Elevated right heart pressures with significantly increased transpulmonic gradient with severe pulmonary arterial hypertension with PA pressure 68/31 and mean pressure 46 mmHg.  PVR: 5.9 WU (Fick) and 7.1 WU (Thermo)  01/15/2019-CT chest high-res-redemonstrated moderate pulmonary fibrosis in a pattern with apical to basilar gradient featuring peripheral interstitial opacity and suggestion of nodularity, mild traction bronchiectasis and occasional areas of subpleural bronchiolectasis in the bases, no evidence of honeycombing, findings remain indeterminate for UIP and differential considerations include both UIP as well as chronic fibrosing NSIP.  Findings are relatively stable from 2019 CT.  04/19/2018-echocardiogram-LV ejection fraction 65 to 70%, PA P pressure 63  10/23/2015-pulmonary function test- FVC 1.44 (56% predicted), postbronchodilator ratio 86, postbronchodilator FEV1 1.26 (66% predicted), no bronchodilator response, mid flow reversibility, DLCO 12.65   03/12/2019-pulmonary function test- FVC 1.08 (44% predicted), postbronchodilator ratio 91, postbronchodilator FEV1 0.97 (54% predicted), DLCO 8.71 (49% predicted)  CTD Labwork (2014) - neg ANA, ANCA, RA factor , nml ESR , CRP  02/25/2019-connective tissue work-up CCP less than 16 FANA staining pattern's-homogenous pattern 1: 320, speckled pattern 1: 320 Rheumatoid factor less than 14 ANA titer positive Double-stranded DNA AB- ENA RNP AB-0.6 ENA SM AB serology- less than 0.2 Scleroderma antibody-negative ENA SSA   Ro AB--0.2 ENA SSB LA AB 2.3   Social History   Tobacco Use  Smoking Status Never Smoker  Smokeless Tobacco Never Used    Immunization History  Administered Date(s) Administered  . Influenza Split 02/22/2012, 03/01/2013, 02/08/2014, 01/27/2015  . Influenza Whole 04/11/2011, 02/10/2017  . Influenza, High Dose Seasonal PF 02/16/2017, 01/20/2019  . Influenza,inj,Quad PF,6+ Mos 03/11/2016  . Influenza-Unspecified 01/26/2018  . PFIZER SARS-COV-2 Vaccination 06/30/2019, 07/21/2019  . Pneumococcal Conjugate-13 03/29/2014  . Pneumococcal Polysaccharide-23 12/08/2017  . Pneumococcal-Unspecified 04/11/2011, 05/13/2016  . Td 08/30/2008  . Tdap 11/16/2017  . Zoster 08/07/2011  . Zoster Recombinat (Shingrix) 09/20/2016, 02/19/2017      Assessment and Plan:  Pulmonary HTN ([place]) Severe pulmonary hypertension in the setting of interstitial lung disease/IPF  Discussion: Offered Tyvaso start for patient, she is not interested in doing this at this time.  Offered appointment with clinical pharmacy team to help further answer questions about starting inhaled Tyvaso.  Patient declines this at this time.  She reports that she will continue to think about it.  Plan: Continue follow-up with our team. If weights are trending up please let our office know 4-week follow-up with Dr. [name] in [place] clinic in person Explained to patient that if she becomes interested in starting Tyvaso she can contact our office to schedule an appointment with the pharmacy team or discussed with Dr. [name] at next visit  IPF (idiopathic pulmonary fibrosis) / IPAF Plan: Continue Ofev Continue oxygen therapy Offered Tyvaso start, patient declined Keep 4 week follow-up with Dr. [name] in [facility]  Chronic respiratory failure with hypoxia [facility]) Plan: Continue oxygen therapy at this time  Medication management Discussed Tyvaso start Patient is not interested in starting Tyvaso at this time Patient reports she will continue to think about this Offered pharmacy team appointment to further discuss, patient  declined  Plan: We can review in 4 weeks with next appointment with Dr. [name]  Goals of care, counseling/discussion Discussed again with patient today her thoughts on being a DO NOT INTUBATE as well as a DO NOT RESUSCITATE.  Patient confirmed today that she does not want to be intubated or resuscitated.  She would like to be a DNI DNR.  Plan: I will report this to primary care as well as Dr. [name].  They can help patient complete forms at next in-person office visit to get this documented appropriately.   Follow Up Instructions:  Return in about 4 weeks (around 02/25/2020), or if symptoms worsen or fail to improve, for  , Follow up with Dr. Purnell Shoemaker, ILD clinic - 90mn slot.   I discussed the assessment and treatment plan with the patient. The patient was provided an opportunity to ask questions and all were answered. The patient agreed with the plan and demonstrated an understanding of the instructions.   The patient was advised to call back or seek an in-person evaluation if the symptoms worsen or if the condition fails to improve as anticipated.  I provided 28 minutes of non-face-to-face time during this encounter.   BLauraine Rinne NP

## 2020-01-28 NOTE — Assessment & Plan Note (Signed)
Plan: Continue Ofev Continue oxygen therapy Offered Tyvaso start, patient declined Keep 4 week follow-up with Dr. [name] in [place]

## 2020-01-28 NOTE — Assessment & Plan Note (Signed)
Discussed again with patient today her thoughts on being a DO NOT INTUBATE as well as a DO NOT RESUSCITATE.  Patient confirmed today that she does not want to be intubated or resuscitated.  She would like to be a DNI DNR.  Plan: I will report this to primary care as well as Dr. [name].  They can help patient complete forms at next in-person office visit to get this documented appropriately.

## 2020-01-31 ENCOUNTER — Ambulatory Visit
Admission: RE | Admit: 2020-01-31 | Discharge: 2020-01-31 | Disposition: A | Discharge status: Home / Self Care | Payer: Medicare PPO | Source: Ambulatory Visit | Attending: Primary Care | Admitting: Primary Care

## 2020-01-31 ENCOUNTER — Other Ambulatory Visit: Payer: Self-pay

## 2020-01-31 DIAGNOSIS — Z1231 Encounter for screening mammogram for malignant neoplasm of breast: Secondary | ICD-10-CM

## 2020-02-01 LAB — PULMONARY FUNCTION TEST
DL/VA % pred: 105 %
DL/VA: 4.78 ml/min/mmHg/L
DLCO cor % pred: 54 %
DLCO unc % pred: 58 %
DLCO unc: 12.65 ml/min/mmHg
FEF 25-75 Post: 2.05 L/sec
FEF 25-75 Pre: 1.7 L/sec
FEF2575-%Pred-Post: 136 %
FEV1-%Pred-Post: 66 %
FEV1FVC-%Change-Post: 1 %
FEV1FVC-%Pred-Pre: 113 %
FEV6-%Pred-Post: 60 %
FEV6FVC-%Pred-Post: 105 %
FVC-%Pred-Post: 57 %
FVC-%Pred-Pre: 56 %
FVC-Post: 1.47 L
FVC-Pre: 1.44 L
Post FEV1/FVC ratio: 86 %
Post FEV6/FVC ratio: 100 %
Pre FEV1/FVC ratio: 85 %
Pre FEV6/FVC Ratio: 100 %

## 2020-02-01 LAB — PULMONARY FUNCTION TEST ARMC ONLY
FEF 25-75 Pre: 1.87 L/sec
FEF2575-%Pred-Pre: 141 %
FEV1-%Pred-Pre: 58 %
FEV1-Pre: 1.05 L
FEV1FVC-%Pred-Pre: 120 %
FEV6-%Pred-Pre: 51 %
FEV6-Pre: 1.17 L
FEV6FVC-%Pred-Pre: 106 %
FVC-%Pred-Pre: 48 %
Pre FEV1/FVC ratio: 89 %
Pre FEV6/FVC Ratio: 100 %

## 2020-02-02 ENCOUNTER — Encounter: Payer: Self-pay | Admitting: Primary Care

## 2020-02-02 ENCOUNTER — Other Ambulatory Visit: Payer: Self-pay

## 2020-02-02 ENCOUNTER — Ambulatory Visit: Payer: Medicare PPO | Admitting: Primary Care

## 2020-02-02 VITALS — BP 126/80 | HR 63 | Temp 96.1°F | Ht 62.5 in | Wt 116.8 lb

## 2020-02-02 DIAGNOSIS — D638 Anemia in other chronic diseases classified elsewhere: Secondary | ICD-10-CM

## 2020-02-02 DIAGNOSIS — J9611 Chronic respiratory failure with hypoxia: Secondary | ICD-10-CM

## 2020-02-02 DIAGNOSIS — I700 Atherosclerosis of aorta: Secondary | ICD-10-CM | POA: Diagnosis not present

## 2020-02-02 DIAGNOSIS — R7303 Prediabetes: Secondary | ICD-10-CM

## 2020-02-02 DIAGNOSIS — I6300 Cerebral infarction due to thrombosis of unspecified precerebral artery: Secondary | ICD-10-CM | POA: Diagnosis not present

## 2020-02-02 DIAGNOSIS — E890 Postprocedural hypothyroidism: Secondary | ICD-10-CM | POA: Diagnosis not present

## 2020-02-02 DIAGNOSIS — J84112 Idiopathic pulmonary fibrosis: Secondary | ICD-10-CM

## 2020-02-02 DIAGNOSIS — K219 Gastro-esophageal reflux disease without esophagitis: Secondary | ICD-10-CM

## 2020-02-02 DIAGNOSIS — Z0000 Encounter for general adult medical examination without abnormal findings: Secondary | ICD-10-CM | POA: Diagnosis not present

## 2020-02-02 DIAGNOSIS — E785 Hyperlipidemia, unspecified: Secondary | ICD-10-CM | POA: Diagnosis not present

## 2020-02-02 DIAGNOSIS — Z8673 Personal history of transient ischemic attack (TIA), and cerebral infarction without residual deficits: Secondary | ICD-10-CM

## 2020-02-02 DIAGNOSIS — I5032 Chronic diastolic (congestive) heart failure: Secondary | ICD-10-CM

## 2020-02-02 LAB — LIPID PANEL
Cholesterol: 119 mg/dL (ref 0–200)
HDL: 46.7 mg/dL (ref >39.00)
LDL Cholesterol: 54 mg/dL (ref 0–99)
NonHDL: 72.06
Total CHOL/HDL Ratio: 3
Triglycerides: 88 mg/dL (ref 0.0–149.0)
VLDL: 17.6 mg/dL (ref 0.0–40.0)

## 2020-02-02 LAB — CBC
HCT: 40.6 % (ref 36.0–46.0)
Hemoglobin: 13.2 g/dL (ref 12.0–15.0)
MCHC: 32.4 g/dL (ref 30.0–36.0)
MCV: 88.9 fl (ref 78.0–100.0)
Platelets: 161 10*3/uL (ref 150.0–400.0)
RBC: 4.56 Mil/uL (ref 3.87–5.11)
RDW: 15.1 % (ref 11.5–15.5)
WBC: 6.8 10*3/uL (ref 4.0–10.5)

## 2020-02-02 LAB — HEMOGLOBIN A1C: Hgb A1c MFr Bld: 6.2 % (ref 4.6–6.5)

## 2020-02-02 LAB — TSH: TSH: 0.17 u[IU]/mL — ABNORMAL LOW (ref 0.35–4.50)

## 2020-02-02 MED ORDER — CLOPIDOGREL BISULFATE 75 MG PO TABS: 75.0000 mg | ORAL_TABLET | Freq: Every day | ORAL | 3 refills | Status: AC

## 2020-02-02 NOTE — Assessment & Plan Note (Signed)
Repeat lipids pending. Compliant to atorvastatin and clopidogrel.

## 2020-02-02 NOTE — Assessment & Plan Note (Signed)
Doing well on PPI, no symptoms. Discussed a trial of weaning off. She will updated.

## 2020-02-02 NOTE — Assessment & Plan Note (Signed)
Following with pulmonology. Continue current treatment and continuous oxygen therapy.  Now DNR.

## 2020-02-02 NOTE — Assessment & Plan Note (Signed)
She is taking levothyroxine incorrectly along with her PPI. Discussed to separate iron, vitamins, PPI four hours from levothyroxine.  Repeat TSH pending. Would recommend repeating TSH if elevated as she is taking incorrectly.

## 2020-02-02 NOTE — Progress Notes (Signed)
Subjective:    Patient ID: Tina Calderon, female    DOB: Jun 27, 1940, 79 y.o.   MRN: [id number]  HPI  This visit occurred during the SARS-CoV-2 public health emergency.  Safety protocols were in place, including screening questions prior to the visit, additional usage of staff PPE, and extensive cleaning of exam room while observing appropriate contact time as indicated for disinfecting solutions.   [name] is a 79 year old female who presents today for complete physical.  Immunizations: -Tetanus: Completed in 2019 -Influenza: Duet this season  -Shingles: Completed series  -Pneumonia: UTD -Covid-19: Completed series   Diet: She endorses a fair diet.  Exercise: Little activity due to PF  Eye exam: Completed in 2020, due in December 2021 Dental exam: No regular visits  Mammogram: Completed in 2021 Dexa: Completed in 2020, osteopenia Colonoscopy: Completed in 2020   BP Readings from Last 3 Encounters:  02/02/20 126/80  01/11/20 125/74  12/31/19 112/70     Review of Systems  Constitutional: Negative for unexpected weight change.  HENT: Negative for rhinorrhea.   Respiratory: Negative for cough and shortness of breath.   Cardiovascular: Negative for chest pain.  Gastrointestinal: Negative for constipation and diarrhea.  Genitourinary: Negative for difficulty urinating.  Musculoskeletal: Negative for arthralgias and myalgias.  Skin: Negative for rash.  Allergic/Immunologic: Negative for environmental allergies.  Neurological: Negative for dizziness, numbness and headaches.  Psychiatric/Behavioral:       Intermittent stress/anxiety, overall doing okay.       Past Medical History:  Diagnosis Date  . Anginal pain ([place])   . Cancer ([place])   . CHF (congestive heart failure) ([place]) 01/22/2012  . Chronic fatigue 07/15/2014  . CVA (cerebral vascular accident) ([place]) 04/18/2018  . Family history of pulmonary fibrosis   . GERD (gastroesophageal reflux disease) 05/12/2017   . Hypertension   . Hypothyroidism   . ILD (interstitial lung disease) ([place]) 02/04/2012  . IPF (idiopathic pulmonary fibrosis) ([place]) 2013  . Mild intermittent asthma 01/29/2016  . Pelvic relaxation   . Prolapse of bladder   . Shortness of breath   . Thyroid disease      Social History   Socioeconomic History  . Marital status: Married    Spouse name: Not on file  . Number of children: 2  . Years of education: Not on file  . Highest education level: Not on file  Occupational History  . Not on file  Tobacco Use  . Smoking status: Never Smoker  . Smokeless tobacco: Never Used  Vaping Use  . Vaping Use: Never used  Substance and Sexual Activity  . Alcohol use: No  . Drug use: No  . Sexual activity: Never    Birth control/protection: Post-menopausal, Surgical    Comment: HYST  Other Topics Concern  . Not on file  Social History Narrative   Married.   2 children, 5 grandchildren.   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   Enjoys puzzles, sewing, traveling, reading.    Social Determinants of Health   Financial Resource Strain:   . Difficulty of Paying Living Expenses: Not on file  Food Insecurity:   . Worried About Charity fundraiser in the Last Year: Not on file  . Ran Out of Food in the Last Year: Not on file  Transportation Needs:   . Lack of Transportation (Medical): Not on file  . Lack of Transportation (Non-Medical): Not on file  Physical Activity:   . Days of Exercise per Week: Not  on file  . Minutes of Exercise per Session: Not on file  Stress:   . Feeling of Stress : Not on file  Social Connections:   . Frequency of Communication with Friends and Family: Not on file  . Frequency of Social Gatherings with Friends and Family: Not on file  . Attends Religious Services: Not on file  . Active Member of Clubs or Organizations: Not on file  . Attends Archivist Meetings: Not on file  . Marital Status: Not on file  Intimate Partner Violence:   . Fear of  Current or Ex-Partner: Not on file  . Emotionally Abused: Not on file  . Physically Abused: Not on file  . Sexually Abused: Not on file    Past Surgical History:  Procedure Laterality Date  . ANTERIOR AND POSTERIOR REPAIR  12/05/2011   Procedure: ANTERIOR (CYSTOCELE) AND POSTERIOR REPAIR (RECTOCELE);  Surgeon: [name], MD;  Location: [facility];  Service: Gynecology;  Laterality: N/A;  with cysto  . Bladder tact  15 years ago  . COLONOSCOPY WITH PROPOFOL N/A 04/20/2019   Procedure: COLONOSCOPY WITH PROPOFOL;  Surgeon: [name], MD;  [facility]COPY;  Service: Gastroenterology;  Laterality: N/A;  . LEFT HEART CATHETERIZATION WITH CORONARY ANGIOGRAM N/A 01/22/2012   Procedure: LEFT HEART CATHETERIZATION WITH CORONARY ANGIOGRAM;  Surgeon: [name], MD;  Location: [facility] CATH LAB;  Service: Cardiovascular;  Laterality: N/A;  . NECK SURGERY  2010  . PSEUDOANERYSM COMPRESSION Right 11/29/2019   Procedure: PSEUDOANERYSM COMPRESSION;  Surgeon: Algernon Huxley, MD;  Location: [facility];  Service: Cardiovascular;  Laterality: Right;  . RIGHT HEART CATH N/A 11/19/2019   Procedure: RIGHT HEART CATH;  Surgeon: [name], MD;  Location: [facility];  Service: Cardiovascular;  Laterality: N/A;  . SHOULDER SURGERY    . thyroid disease    . THYROID LOBECTOMY Left 07/17/2018   Procedure: LEFT THYROID LOBECTOMY;  Surgeon: [name], MD;  Location: [facility];  Service: General;  Laterality: Left;  . TONSILLECTOMY    . VAGINAL HYSTERECTOMY  15 years ago    Family History  Problem Relation Age of Onset  . Thyroid disease Mother   . Hypertension Mother   . Coronary artery disease Mother   . Dementia Mother   . Stroke Mother   . Aneurysm Father   . Pulmonary fibrosis Sister   . Stroke Other   . Sleep apnea Other   . Liver disease Brother   . Pulmonary fibrosis Brother   . Stroke Maternal Grandmother   . Dementia Maternal Grandmother   . Heart attack Maternal  Grandfather 67  . COPD Son     Allergies  Allergen Reactions  . Crestor [Rosuvastatin Calcium] Other (See Comments)    Myalgias, muscle weakness  . Dilaudid [Hydromorphone] Nausea And Vomiting    Current Outpatient Medications on File Prior to Visit  Medication Sig Dispense Refill  . acetaminophen (TYLENOL) 500 MG tablet Take 1,000 mg by mouth every 6 (six) hours as needed for mild pain or headache.     [name] amLODipine (NORVASC) 5 MG tablet TAKE 1 TABLET BY MOUTH EVERY DAY FOR BLOOD PRESSURE (Patient taking differently: Take 5 mg by mouth daily. ) 90 tablet 1  . atorvastatin (LIPITOR) 40 MG tablet TAKE 1 TABLET (40 MG TOTAL) BY MOUTH AT BEDTIME. FOR CHOLESTEROL. 90 tablet 0  . CALCIUM CARBONATE-VITAMIN D PO Take 1 tablet by mouth daily.     [name] CRANBERRY PO  Take 1 capsule by mouth every Monday, Wednesday, and Friday.     . ferrous sulfate 325 (65 FE) MG tablet Take 325 mg by mouth every evening.     [name] levothyroxine (SYNTHROID) 100 MCG tablet TAKE 1 TABLET BY MOUTH EVERY MORNING ON AN EMPTY STOMACH. NO FOOD OR OTHER MEDICATIONS FOR 30 MIN (Patient taking differently: Take 100 mcg by mouth daily before breakfast. AN EMPTY STOMACH. NO FOOD OR OTHER MEDICATIONS FOR 30 MIN) 90 tablet 1  . loratadine (CLARITIN) 10 MG tablet Take 10 mg by mouth daily.     . metoprolol succinate (TOPROL-XL) 25 MG 24 hr tablet TAKE 1 TABLET BY MOUTH EVERY DAY 90 tablet 3  . Multiple Vitamins-Minerals (CENTRUM SILVER PO) Take 1 tablet by mouth daily.    [name] OFEV 150 MG CAPS Take 150 mg by mouth every 12 (twelve) hours. As Directed    . omeprazole (PRILOSEC) 20 MG capsule TAKE 1 CAPSULE BY MOUTH EVERY DAY (Patient taking differently: Take 20 mg by mouth daily. ) 90 capsule 1   No current facility-administered medications on file prior to visit.    BP 126/80   Pulse 63   Temp (!) 96.1 F (35.6 C) (Temporal)   Ht 5' 2.5" (1.588 m)   Wt 116 lb 12 oz (53 kg)   SpO2 93%   BMI 21.01 kg/m    Objective:   Physical  Exam HENT:     Right Ear: Tympanic membrane and ear canal normal.     Left Ear: Tympanic membrane and ear canal normal.  Eyes:     Pupils: Pupils are equal, round, and reactive to light.  Cardiovascular:     Rate and Rhythm: Normal rate and regular rhythm.  Pulmonary:     Effort: Pulmonary effort is normal.     Comments: Crackles to bilateral bases Abdominal:     General: Bowel sounds are normal.     Palpations: Abdomen is soft.     Tenderness: There is no abdominal tenderness.  Musculoskeletal:        General: Normal range of motion.     Cervical back: Neck supple.  Skin:    General: Skin is warm and dry.  Neurological:     Mental Status: She is alert and oriented to person, place, and time.     Cranial Nerves: No cranial nerve deficit.     Deep Tendon Reflexes:     Reflex Scores:      Patellar reflexes are 2+ on the right side and 2+ on the left side. Psychiatric:        Mood and Affect: Mood normal.            Assessment & Plan:

## 2020-02-02 NOTE — Assessment & Plan Note (Signed)
Repeat A1C pending. Encouraged a healthy diet and exercise.

## 2020-02-02 NOTE — Assessment & Plan Note (Signed)
Repeat CBC pending.

## 2020-02-02 NOTE — Assessment & Plan Note (Signed)
Immunizations UTD. Mammogram UTD. Colonoscopy UTD. Bone density UTD.  Encouraged a healthy diet and regular exercise.  Exam today stable. Labs pending.

## 2020-02-02 NOTE — Assessment & Plan Note (Signed)
Appears euvolvemic.  Continue to monitor.

## 2020-02-02 NOTE — Assessment & Plan Note (Signed)
Compliant to clopidogrel and atorvastatin. Repeat lipid panel pending.  Asymptomatic.

## 2020-02-02 NOTE — Assessment & Plan Note (Signed)
Compliant to clopidogrel and atorvastatin. Repeat lipids pending. Asymptomatic.

## 2020-02-02 NOTE — Assessment & Plan Note (Signed)
Following with pulmonology, terminal diagnosis of pulmonary fibrosis. On supplemental oxygen continuously, now DNR.

## 2020-02-02 NOTE — Patient Instructions (Addendum)
Stop by the lab prior to leaving today. I will notify you of your results once received.   Try to get some regular activity. Work on a healthy diet.  Be sure to take your levothyroxine (thyroid medication) every morning on an empty stomach with water only. No food or other medications for 30 minutes. No heartburn medication, iron pills, calcium, vitamin D, or magnesium pills within four hours of taking levothyroxine.   It was a pleasure to see you today!   Preventive Care 7 Years and Older, Female Preventive care refers to lifestyle choices and visits with your health care provider that can promote health and wellness. This includes:  A yearly physical exam. This is also called an annual well check.  Regular dental and eye exams.  Immunizations.  Screening for certain conditions.  Healthy lifestyle choices, such as diet and exercise. What can I expect for my preventive care visit? Physical exam Your health care provider will check:  Height and weight. These may be used to calculate body mass index (BMI), which is a measurement that tells if you are at a healthy weight.  Heart rate and blood pressure.  Your skin for abnormal spots. Counseling Your health care provider may ask you questions about:  Alcohol, tobacco, and drug use.  Emotional well-being.  Home and relationship well-being.  Sexual activity.  Eating habits.  History of falls.  Memory and ability to understand (cognition).  Work and work Statistician.  Pregnancy and menstrual history. What immunizations do I need?  Influenza (flu) vaccine  This is recommended every year. Tetanus, diphtheria, and pertussis (Tdap) vaccine  You may need a Td booster every 10 years. Varicella (chickenpox) vaccine  You may need this vaccine if you have not already been vaccinated. Zoster (shingles) vaccine  You may need this after age 42. Pneumococcal conjugate (PCV13) vaccine  One dose is recommended after age  81. Pneumococcal polysaccharide (PPSV23) vaccine  One dose is recommended after age 47. Measles, mumps, and rubella (MMR) vaccine  You may need at least one dose of MMR if you were born in 1957 or later. You may also need a second dose. Meningococcal conjugate (MenACWY) vaccine  You may need this if you have certain conditions. Hepatitis A vaccine  You may need this if you have certain conditions or if you travel or work in places where you may be exposed to hepatitis A. Hepatitis B vaccine  You may need this if you have certain conditions or if you travel or work in places where you may be exposed to hepatitis B. Haemophilus influenzae type b (Hib) vaccine  You may need this if you have certain conditions. You may receive vaccines as individual doses or as more than one vaccine together in one shot (combination vaccines). Talk with your health care provider about the risks and benefits of combination vaccines. What tests do I need? Blood tests  Lipid and cholesterol levels. These may be checked every 5 years, or more frequently depending on your overall health.  Hepatitis C test.  Hepatitis B test. Screening  Lung cancer screening. You may have this screening every year starting at age 31 if you have a 30-pack-year history of smoking and currently smoke or have quit within the past 15 years.  Colorectal cancer screening. All adults should have this screening starting at age 47 and continuing until age 45. Your health care provider may recommend screening at age 25 if you are at increased risk. You will have tests every  1-10 years, depending on your results and the type of screening test.  Diabetes screening. This is done by checking your blood sugar (glucose) after you have not eaten for a while (fasting). You may have this done every 1-3 years.  Mammogram. This may be done every 1-2 years. Talk with your health care provider about how often you should have regular  mammograms.  BRCA-related cancer screening. This may be done if you have a family history of breast, ovarian, tubal, or peritoneal cancers. Other tests  Sexually transmitted disease (STD) testing.  Bone density scan. This is done to screen for osteoporosis. You may have this done starting at age 49. Follow these instructions at home: Eating and drinking  Eat a diet that includes fresh fruits and vegetables, whole grains, lean protein, and low-fat dairy products. Limit your intake of foods with high amounts of sugar, saturated fats, and salt.  Take vitamin and mineral supplements as recommended by your health care provider.  Do not drink alcohol if your health care provider tells you not to drink.  If you drink alcohol: ? Limit how much you have to 0-1 drink a day. ? Be aware of how much alcohol is in your drink. In the [place] one drink equals one 12 oz bottle of beer (355 mL), one 5 oz glass of wine (148 mL), or one 1 oz glass of hard liquor (44 mL). Lifestyle  Take daily care of your teeth and gums.  Stay active. Exercise for at least 30 minutes on 5 or more days each week.  Do not use any products that contain nicotine or tobacco, such as cigarettes, e-cigarettes, and chewing tobacco. If you need help quitting, ask your health care provider.  If you are sexually active, practice safe sex. Use a condom or other form of protection in order to prevent STIs (sexually transmitted infections).  Talk with your health care provider about taking a low-dose aspirin or statin. What's next?  Go to your health care provider once a year for a well check visit.  Ask your health care provider how often you should have your eyes and teeth checked.  Stay up to date on all vaccines. This information is not intended to replace advice given to you by your health care provider. Make sure you discuss any questions you have with your health care provider. Document Revised: 05/21/2018 Document  Reviewed: 05/21/2018 Elsevier Patient Education  2020 [employer].

## 2020-02-03 ENCOUNTER — Other Ambulatory Visit: Payer: Self-pay | Admitting: Primary Care

## 2020-02-03 DIAGNOSIS — E890 Postprocedural hypothyroidism: Secondary | ICD-10-CM

## 2020-02-03 MED ORDER — LEVOTHYROXINE SODIUM 88 MCG PO TABS: ORAL_TABLET | ORAL | 1 refills | Status: DC

## 2020-02-16 ENCOUNTER — Ambulatory Visit: Payer: Medicare PPO | Admitting: Internal Medicine

## 2020-03-01 ENCOUNTER — Encounter: Payer: Self-pay | Admitting: Cardiology

## 2020-03-02 ENCOUNTER — Other Ambulatory Visit: Payer: Self-pay

## 2020-03-02 ENCOUNTER — Ambulatory Visit (INDEPENDENT_AMBULATORY_CARE_PROVIDER_SITE_OTHER): Payer: Medicare PPO

## 2020-03-02 DIAGNOSIS — Z23 Encounter for immunization: Secondary | ICD-10-CM | POA: Diagnosis not present

## 2020-03-09 ENCOUNTER — Telehealth: Payer: Self-pay | Admitting: Internal Medicine

## 2020-03-09 MED ORDER — OFEV 150 MG PO CAPS: 150.0000 mg | ORAL_CAPSULE | Freq: Two times a day (BID) | ORAL | 3 refills | Status: DC

## 2020-03-09 NOTE — Telephone Encounter (Signed)
Called and spoke with patient and verified pharmacy for refill to be sent in. RX for OFEV sent to [facility]. Nothing further needed at this time.

## 2020-03-13 ENCOUNTER — Other Ambulatory Visit: Payer: Self-pay | Admitting: Primary Care

## 2020-03-13 DIAGNOSIS — I6300 Cerebral infarction due to thrombosis of unspecified precerebral artery: Secondary | ICD-10-CM

## 2020-03-24 ENCOUNTER — Encounter: Payer: Self-pay | Admitting: Internal Medicine

## 2020-03-24 ENCOUNTER — Other Ambulatory Visit: Payer: Self-pay

## 2020-03-24 ENCOUNTER — Ambulatory Visit: Payer: Medicare PPO | Admitting: Internal Medicine

## 2020-03-24 VITALS — BP 118/60 | HR 106 | Temp 97.5°F | Ht 62.5 in | Wt 117.4 lb

## 2020-03-24 DIAGNOSIS — R634 Abnormal weight loss: Secondary | ICD-10-CM

## 2020-03-24 DIAGNOSIS — J84112 Idiopathic pulmonary fibrosis: Secondary | ICD-10-CM | POA: Diagnosis not present

## 2020-03-24 DIAGNOSIS — K521 Toxic gastroenteritis and colitis: Secondary | ICD-10-CM | POA: Diagnosis not present

## 2020-03-24 DIAGNOSIS — J9611 Chronic respiratory failure with hypoxia: Secondary | ICD-10-CM | POA: Diagnosis not present

## 2020-03-24 DIAGNOSIS — T50905A Adverse effect of unspecified drugs, medicaments and biological substances, initial encounter: Secondary | ICD-10-CM

## 2020-03-24 DIAGNOSIS — Z7189 Other specified counseling: Secondary | ICD-10-CM

## 2020-03-24 DIAGNOSIS — I499 Cardiac arrhythmia, unspecified: Secondary | ICD-10-CM

## 2020-03-24 NOTE — Progress Notes (Signed)
79 yo female never smoker seen for initial pulmonary consult during hospital for acute respiratory failure   [facility] follow up 01/30/12 -  Pt was admitted 2 weeks ago for chest pain and dyspnea.  Found to have Acute respiratory failure with hypoxia--  Tx for possible diastolic CHF with aggressive diuresis Echo showed EF 60% , no mention of diastolic dysfxn.  HRCT consistent with ILD-  CCM consult with autoimmune workup that was unrevealing with neg ANA, ANCA, RA factor , nml ESR , CRP    Does light housework but not able walk long distances due to dyspnea  FH of pulmonary fibrosis -sibling -they were smokers. NO significant desats with walking in cardiology yesterday, desated to 93% on room air.  No cough or wheezing  No edema.   Since discharge she is feeling better with less DOE. But does get winded with walking long distances.   ROV 02/21/12 -- follows up for ILD noted during hospitalization for resp failure. Treated for possible diastolic CHF. CT scan shows some mild basilar fibrotic change. PFT's 9/3 >> probable mixed disease, no BD response, restricted volumes, decrease DLCO that corrects for Va.  She had family hx of ILD.  She had walking oximetry that she says was reassuring. She feels no better today than at hospital discharge.   ROV 03/24/12 -- ILD without clear etiology, mixed dz on PFT 02/11/12. Initiated a trial prednisone last month >> She doesn't believe that her dyspnea has changed.  Last time she did not desat.   ROV 06/23/12 -- ILD without clear etiology, mixed dz on PFT 02/11/12. Trial of pred >> weaned to off. Sent her to pulm rehab at [facility], feels that it has benefited her. She isn't as SOB as before. CXR today >> stable bibasilar ILD, hiatal hernia.   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 Also possible component diastolic dysfxn. She has a family hx of ILD.  She tells me that her dyspnea is worse compared with last year. She is having some spells of near syncope on exertion. Walked today to exhaustion without desaturation.   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 She has been part of the pirfenidone trial and has been on the medication. She will be transitioning to the commercial drug. Denies any side effects from the medication. I completed financial assistance forms on 04/12/13.  Due for CXR today. Not really doing exercise - she did do pulmonary rehab at [facility].    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 Last CXR 05/19/13, last CT scan 01/24/12. She is still on the pirfenidone trial, receiving study drug. She is fairly stable. Will be converted to the commercial med soon. Tolerates soon.   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 Returns for f/u. She is now on pirfenidone 3 pills tid and is tolerating. She is having more problems with fatigue and dyspnea with exertion - has lay down after eating dinner, with chores. Her synthroid hasn't been adjusted in a long time.  Her last labs were March 3, 15.   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 She is on pirfenidone 3 pills tid. She has been tolerating well from GI standpoint. TSH normal. She has some fatigue, otherwise doing  well. She needs LFT done in 3 months and after that we can go to every 6 months,.   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 On pirfenidone 3 pills tid since August 2014. Last CT scan 5/'15 was stable, described as an NSIP pattern.  LFT were done today, not yet available. She has noticed some slightly raised pale pink macules, not pruritic, since last month. She has  been careful to wear sunscreen.    ROV 12/01/17 --this follow-up visit for patient with a history of idiopathic interstitial lung disease, some coexisting obstruction based on her spirometry.  She also has a hiatal hernia with GERD and chronic cough.  We have managed her on pirfenidone.  At our last visit she noted that she is had some decreased functional capacity, more dyspnea with exertion. She is still having decreased energy, more fatigue with activity. We checked CBC and TSH, both reassuring. She keeps a slight cough, added chlorpheniramine to loratadine. Remains on omeprazole.    She underwent a 6-minute walk test today and was able to walk 462 m without desaturation.  Her last in 04/2016 was 444mRepeat CT chest from 11/28/2017 reviewed and shows stable interstitial changes compared with her most recent from 2017.  There is patchy groundglass attenuation with associated septal thickening and peripheral bronchial ectasis without overt honeycomb change.  No effusion, no suspicious nodules.   ROV 06/18/18 --Ms. Detwiler is 89 followed for history of idiopathic interstitial lung disease and some coexisting obstruction noted on spirometry.  She also has a hiatal hernia with GERD and some associated chronic cough.  We have been managing her on pirfenidone - no side effects.  She is on omeprazole, loratadine, chlorpheniramine.  Since our last visit she was hospitalized for a stroke, has some residual word-finding trouble. She is scheduled for nerve conduction studies after some falls and leg weakness. She is also being evaluated for possible thyroid surgery with Dr. GHarlow Asa She had a CT chest done 01/12/18 that I reviewed, shows no real change in NSIP pattern, large HH, no new infiltrates. Overall reports that breathing is stable, not limiting.   ROV 12/03/2018 --pleasant 79year old woman with mixed lung disease.  She has mild obstruction and restriction in the setting of a hiatal hernia and idiopathic  interstitial lung disease that would characterizes IPF.  We have been managing her on pirfenidone.  Also with a history of GERD on Prilosec, allergic rhinitis on loratadine. Last LFT in 05/2018 were normal. Last Ct chest was 01/2018. She is having progressive exertional SOB compared with last time. She has to rest doing housework, taking out the trash. She has to stop to rest. Not on BD's, didn't benefit. She had thyroid sgy in 07/2018, just had her synthroid increased in 07/2018. TSH was 3.05 5/26.     OV 02/25/2019  Subjective:  Patient ID: NRutherford Limerick female , DOB: 723-Mar-1942, age 89y.o. , MRN: [id number], [id number] [id number]  02/25/2019 -   Chief Complaint  Patient presents with  . IPF (idiopathic pulmonary fibrosis)    Had 6 minute walk yesterday.     Clinical IPF patient  - On Esbreit since aug 2014 initially via  expanded access program   - Autoimmune panel 2013 and 2014 is negative and normal   - Pulmonary function test 02/15/2014 postbronchodilator FVC 1.55 L/  59%, total lung capacity 3.05 L/64%, DLCO 14.3/66%  - CT  chest  May 2015 - NSIP per radioolgy  - Trial participagnt early 08-01-2014 for 6 months IPF - Phase 4 study - got esbriet and 6 months of add-on ofev   HPI Dyamon Sosinski 79 y.o. -has now been referred to the ILD clinic.  This is because she has had progression in her symptoms.  She says for the last few months she has had progressive shortness of breath but no worsening cough or pedal edema or hemoptysis or proximal nocturnal dyspnea.  She is tolerating the Esbriet well since August 2014 when she was in the expanded access program.  Overall she is stable and she is active in the pulmonary fibrosis foundation support group but in the last few months is progressive dyspnea.  Nurse practitioner did a high-resolution CT scan of the chest and it shows progression although the pattern is still indeterminate for UIP.  In addition  6-minute walk test last week she had new onset desaturation and has been started on portable oxygen which is helping.  However she does not have nighttime oxygen.  There is a spirometry that is upcoming.  Review of the spirometry is between 2013/08/01 and 08/02/15 show clear progression in both FVC and DLCO.  Previously serology was negative in 20 13/2014 but she says she might have Raynaud Echocardiogram November 2019 with normal ejection fraction and grade 1 diastolic dysfunction   Integrated Comprehensive ILD Questionnaire  Symptoms: -ILD diagnosed in 20 14/2015.  Which is gradually worse in the last several months.  She is having level for shortness of breath the household work shopping and walking on level ground at her own pace.  She does not think any other condition is limiting her.  No cough.  No wheezing   Past Medical History : Negative for collagen vascular disease acid reflux but positive for previous stroke in November 2019 and thyroid disease.  Detailed past medical history is negative otherwise   ROS: Positive for fatigue   FAMILY HISTORY of LUNG DISEASE: Her sister died in 78 from pulmonary fibrosis and a brother died in 08/01/04 from pulmonary fibrosis   EXPOSURE HISTORY: No smoking no cigars.  No vaping no marijuana.  No cocaine.  No intravenous drug use.   HOME and HOBBY DETAILS : Lives in an apartment.  No mold exposure no mildew exposure.  Extensive home exposure history is negative.   OCCUPATIONAL HISTORY (122 questions) : Positive for gardening but otherwise negative.   PULMONARY TOXICITY HISTORY (27 items): Intermittent prednisone burst otherwise negative.     Results for SKI, POLICH (MRN [id number]) as of 02/25/2019 11:43  Ref. Range 02/15/2014 11:51 07/15/2014 11:47 01/03/2015 09:40 03/17/2015 13:03 10/23/2015 08:45  FVC-Pre Latest Units: L 1.58 1.59 1.52 1.47 1.44   Results for KAURI, GARSON (MRN [id number]) as of 02/25/2019 11:43  Ref. Range 02/15/2014  11:51 07/15/2014 11:47 01/03/2015 09:40 03/17/2015 13:03 10/23/2015 08:45  DLCO unc Latest Units: ml/min/mmHg 14.30 13.65 13.66 12.11 12.65  DLCO unc % pred Latest Units: % 66 63 63 56 58    CT chest high resolution Aug 2020 -personally visualized  standard protocol without intravenous contrast. High resolution imaging of the lungs, as well as inspiratory and expiratory imaging, was performed.  COMPARISON:  11/28/2017, 10/09/2015, 01/24/2012  FINDINGS: Cardiovascular: Aortic atherosclerosis. Cardiomegaly. Left coronary artery calcifications. No pericardial effusion.  Mediastinum/Nodes: No enlarged mediastinal, hilar, or axillary lymph nodes. Large hiatal hernia with intrathoracic position of the gastric body and fundus. Thyroid gland, trachea, and esophagus demonstrate  no significant findings.  Lungs/Pleura: Redemonstrated moderate pulmonary fibrosis in a pattern with an apical to basal gradient, featuring peripheral interstitial opacity and some suggestion of nodularity, mild traction bronchiectasis, and occasional areas of subpleural bronchiolectasis in the bases. There is no evidence of honeycombing. Moderate lobular air trapping on expiratory phase imaging. Findings are not significantly changed when compared to immediate prior examination dated 11/28/2017, however are slightly worsened over time on comparison to multiple prior examinations dating back to at least 01/24/2012. No pleural effusion or pneumothorax.  Upper Abdomen: No acute abnormality.  Musculoskeletal: No chest wall mass or suspicious bone lesions identified.  IMPRESSION: 1. Redemonstrated moderate pulmonary fibrosis in a pattern with an apical to basal gradient, featuring peripheral interstitial opacity and some suggestion of nodularity, mild traction bronchiectasis, and occasional areas of subpleural bronchiolectasis in the bases. There is no evidence of honeycombing. Moderate lobular air trapping  on expiratory phase imaging. Findings are not significantly changed when compared to immediate prior examination dated 11/28/2017, however are slightly worsened over time on comparison to multiple prior examinations dating back to at least 01/24/2012. Findings remain indeterminate for UIP and differential considerations include both UIP and chronic fibrosing NSIP.  2.  Coronary artery disease and aortic atherosclerosis.  3.  Hiatal hernia.   Electronically Signed   By: Eddie Candle M.D.   On: 01/15/2019 12:09  ROS - per HPI Recent Labs  Lab 02/23/19 1153  AST 20  ALT 13  ALKPHOS 47  BILITOT 0.3  PROT 6.8  ALBUMIN 4.1     OV 04/08/2019  Subjective:  Patient ID: [name], female , DOB: 1940-11-08 , age 68 y.o. , MRN: [id number] , ADDRESS: [id number] [place] [address]   04/08/2019 -   Chief Complaint  Patient presents with  . Follow-up    Patient reports sob with exertion and a mild cough.     Clinical IPF patient    - strong family hx of ILD (brother died age 66, sister died in her 29s)  - Autoimmune panel 2013 and 2014 is negative and normal   - Pulmonary function test 02/15/2014 postbronchodilator FVC 1.55 L/  59%, total lung capacity 3.05 L/64%, DLCO 14.3/66%  - CT chest  May 2015 - NSIP per radioolgy  - Rx  - - On Esbreit since aug 2014 initially via  expanded access program (Trial participagnt early 2016 for 6 months IPF - Phase 4 study - got esbriet and 6 months of add-on ofev)  - Switched to ofev due to progessive disease mid-oct 2020 (03/24/2019) - started on portable o2 sept 2020   HPI Sherrian Nunnelley [place] 79 y.o. - returns for followup following starting ofev 2 weeks ago. Therapy was changed due to progressive disease. She is tolerating nintedanib fine.  The couple of episodes of mild diarrhea that she had.  One time she had vomiting.  This is following a a family gathering.  Other than that she is tolerating nintedanib just  fine.  In terms of her ILD even though it is progressed compared to last visit in September she feels she is stable.  She is having significant cough.  She says vitamin C helps this.  She is using portable oxygen as needed.  She wants a lighter tank.  She has had overnight oxygen study with the results are still pending this was done a few days ago.  She reminded me about the strong family history of ILD that she has.  Her  brother died at age 59 7 years ago her sister died in her 15s also from ILD.  She is interested in seeing the Retail buyer.  I have sent email to genetics counselor Roma Kayser within our health system.    ROS   OV 04/29/2019  Subjective:  Patient ID: [name], female , DOB: [birth year] , age 63 y.o. , MRN: [id number] , ADDRESS: [id number] [place] [address]   04/29/2019 -   Chief Complaint  Patient presents with  . Follow-up    Pt states she has been doing well since last visit and denies any complaints.    Follow-up idiopathic pulmonary fibrosis progressive now on nintedanib since mid October 2020. HPI Mercie Balsley [place] 79 y.o. -returns for follow-up.  She is returning sooner than anticipated.  I saw her 3 weeks ago.  I wanted her back in 4-6 weeks to see how the nintedanib is going.  Due to scheduling issues she comes back in 3 weeks.  She tells me there are no new issues.  She is losing weight.  She did have interim colonoscopy which she says was normal.  She is unsure why she is losing weight.  I explained to her that this could be because of IPF or could be due to previous pirfenidone or current nintedanib.  At this point in time she wants to take the drugs and continue to monitor his situation.  Last liver function test 3 weeks ago was normal.  She is now on 2 L nasal cannula at night since end of October 2020.  She is also using oxygen with exertion.  She wants a light portable system.  She has called [facility].  She wants our  support in getting a lightweight portable oxygen system.     OV 05/28/2019  Subjective:  Patient ID: [name], female , DOB: 1940/07/29 , age 64 y.o. , MRN: [id number] , ADDRESS: [address] [place] [address]   05/28/2019 -   Chief Complaint  Patient presents with  . Follow-up    pt reports of sob with exertion. no new concerns or sx.  on 3L at night and 2L during the day PRN.      Follow-up idiopathic pulmonary fibrosis progressive now on nintedanib since mid October 2020.   HPI Iyonnah Ferrante [place] 79 y.o. - 3L night and 2L night prn in day. Normal plse ox Room air at rest. Tolerting ofev well. No diarrhea. No issue swith appetite. No Nausea, vomitting. Lost some weight 122.4# . She lives < 5 miles Dyspnea is stable. She wants portable o2 - but she says [place] at [facility] office has not called her back. [facility] was supposed to be the provider Though on subjective score below she is worse but she tells me she  Is stable . LFT is normal \   OV 07/16/2019  Subjective:  Patient ID: [name], female , DOB: [birth year] , age 69 y.o. , MRN: [id number] , ADDRESS: [address] [place] [address]   07/16/2019 -   Chief Complaint  Patient presents with  . Follow-up    Pt states she has been feeling good since last OV. SOB is getting worse. Occ. non productive cough. Pt denies any wheezing, fever, or chills   Follow-up idiopathic pulmonary fibrosis progressive now on nintedanib since mid October 2020. Chronic hypoxemic respiratory failure due to the above  HPI [place] 79 y.o. -presents  for follow-up.  She feels that since her last visit her dyspnea slightly worse.  However on pulmonary function test FVC is stable and DLCO is only a little bit worse.  Her walking desaturation test shows continued stability.  She is pleased about it.  However her symptom score is significantly worse.  Because of the isolation she is not exercising.   There is no chest pain no edema.  Her weight itself is stable.  She was initially losing weight and nintedanib but currently it is stable and is documented below.  She is frustrated by the lack of portable oxygen.  She is saying she is using a heavy tank.  Apparently our office called her home care agency called [facility] but she never heard back.  The medical assistant inquired today and it is likely because of the COVID-19 pandemic portable oxygen systems are not presently available      l OV 10/15/2019  Subjective:  Patient ID: [name], female , DOB: 02-21-1941 , age 35 y.o. , MRN: [id number] , ADDRESS: [address] [place] [address]   10/15/2019 -   Chief Complaint  Patient presents with  . Follow-up    Patient reports that she's having to use her oxygen more during the day.     Follow-up idiopathic pulmonary fibrosis progressive now on nintedanib since mid October 2020. Chronic hypoxemic respiratory failure due to the above Hiatal hernia on CT 2020 Normal stress test oct 2019  HPI Sherial Ebrahim [place] 79 y.o. -presents for her 79-monthfollow-up.  She continues on nintedanib she continues to have some diarrhea.  She feels her shortness of breath is getting worse.  She is using more oxygen in the daytime but in talking to her it seems the same as of February 2021.  Her weight is also the same since February 2021.  Subjective dyspnea score is unchanged compared to February 2021 even though she is feeling worse in her history.  Her walking desaturation test is similar to February 2021 even though she feels worse.  She is supposed have pulmonary function test today but for some reason it was not scheduled perhaps due to the pandemic and disruptions of the PFT lab.  Review of the records indicate that last liver function test was early part of 2021.  Review of records also indicate the 2019 echocardiogram shows pulmonary systolic pressure of 63 mmHg along with grade 1  diastolic dysfunction.  She does not have significant edema.  She is going to turn 79 years in J06-Jul-2024  Her goal is to live past 79.  She is extremely worried that she will might die before J07-06-21  Both her siblings died early from pulmonary fibrosis.  She is not aware that between her last visit in this visit inhaled treprostinil has been approved for pulmonary hypertension related to pulmonary fibrosis     OV 12/31/2019   Subjective:  Patient ID: NRutherford Limerick female , DOB: [birth year] age 79y.o. years. , MRN: [id number]  ADDRESS[address] [id number]  CPleas Koch NP Providers : Treatment Team:  Attending Provider: RBrand Males MD   Chief Complaint  Patient presents with  . Follow-up    pt reports of sob with exertion.    Follow-up idiopathic pulmonary fibrosis progressive now on nintedanib since mid October 2020. Chronic hypoxemic respiratory failure due to the above Hiatal hernia on CT 2020 Normal stress test oct 2019 Severe pulmonary  hypertension diagnosed on right heart catheterization June 2021.    HPI Ronit Cranfield [place] 79 y.o. -presents for follow-up.  Her weight was between 122 -123 pounds a couple months ago.  Now with 115 pounds.  She believes is because of the pandemic related disruptions.  She also thinks the nintedanib and diarrhea because this weight loss.  She says in the interim on November 19, 2019 she underwent right heart catheterization that showed severe pulmonary hypertension.  And then November 29, 2018 when she ended up with pseudoaneurysm and hematoma.  She was hospitalized here in [place].  After that she was discharged to the nursing home and she just got back a few weeks ago.  She says all this is taken a toll on her weight.  She is now back on the nintedanib for week or 2 or more.  She is tolerating it well.  There is no nausea vomiting.  There is some diarrhea scored 2.  But she says it is actually better.   Nevertheless_she did appears the same.  She says all the recent hospitalization is upset that her emotionally.  Her anxiety and depression scores are slightly higher.  Before she never had it.  She says it is improving.  The medical assistant walked her for routine desaturation.  This time she required 4  liters to correct and even that barely.  We discussed declining functional status and goals of care.  She says she has a living will.  She did not elaborate the content of it.  Advised her DO NOT RESUSCITATE DO NOT INTUBATE she took notified.  She seemed a little upset about the directionality of her disease.  She did ask about life expectancy.  At room air at rest she is normoxic.  Therefore said it could be few months or several months but I was really worried about the loss of weight in the worsening hypoxemia.  She believes the loss of weight is temporally related to recent admission.  She believes she can reset the weight loss by working on it with nutritional changes.  She currently wants to maintain status quo as opposed to calling palliative care.  Also because of the tremendous emotional turmoil with the recent complication of right heart catheterization she is not ready to start inhaled treprostinil.  She does understand that she meets criteria for it. .    November 19, 2019 Elevated right heart pressures with significantly increased transpulmonic gradient with severe pulmonary arterial hypertension with PA pressure 68/31 and mean pressure 46 mmHg. PVR: 5.9 WU (Fick) and 7.1 WU (Thermo) .  Pressures RA: A-wave 7, V wave 6, mean 2 RV: 62/4 PA: 68/31; mean 46 PW: A-wave 7, V wave 8, mean 6      OV 03/24/2020   Subjective:  Patient ID: [name], female , DOB: 1941-02-26, age 82 y.o. years. , MRN: [id number],  ADDRESS: [address] [place] [address] PCP  [name], NP Providers : Treatment Team:  Attending Provider: Brand Males, MD Patient Care  Team: [name], NP as PCP - General (Internal Medicine) Buford Dresser, MD as PCP - Cardiology (Cardiology)    Follow-up idiopathic pulmonary fibrosis progressive now on nintedanib since mid October 2020. Chronic hypoxemic respiratory failure due to the above Hiatal hernia on CT 2020 Normal stress test oct 2019 Severe pulmonary hypertension diagnosed on right heart catheterization June 2021.  - declined Tyvaso Aug 2021    Chief Complaint  Patient presents with  .  Follow-up    Doing well on oxygen, on 3-4L at all times. Denies sob, cough or wheezing.       HPI Aalayah Riles [place] 79 y.o. -presents for follow-up of IPF and chronic hypoxemic respiratory failure.  After I saw her last visit she has discussed with the family and she has adopted formally to be no CPR no intubation but still have full medical care.  In the interim her husband also got admitted.  At baseline he uses a walker but now is oxygen dependent.  And she has to take care of him.  So she is struggling spiritually and emotionally with all this.  She did see nurse practitioner and she declined to do treprostinil for her severe pulmonary hypertension.  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  She is having severe diarrhea because of the nintedanib.  She says she has done holiday of the nintedanib in the past only for the diarrhea to come back.  She is not interested in doing lower dose of nintedanib.  She feels she is at a point where if she passes away she is accepted that but she just wants to live the remaining of her life with high quality and not be miserable with medications giving her side effects.  She also is finding the diarrhea be a hindrance being able to take care of the husband.  She is not interested in clinical trials at this point.  She manages  with 3 L.  Her weight loss is stabilized but she believes that the weight loss is because of the nintedanib.  She still is able to drive    SYMPTOM SCALE - ILD 04/08/2019 126# 04/29/2019 123# 05/28/2019 122# 07/16/2019 123# 10/15/2019 122.6# 12/31/2019 115# 03/24/2020 116#  O2 use Portable o2 as needed        Shortness of Breath 0 -> 5 scale with 5 being worst (score 6 If unable to do)    uses 3 L with exertion and 2-1/2 L at night  3 L at night and 2-1/2 with exertion and at rest in the daytime  required 4 L nasal cannula to correct   At rest _0 Simple tasks - showers, clothes change, eating, shaving _1 Household (dishes, doing bed, laundry) _2 3._3 Shopping _4 Walking level at own pace _5 Walking up Stairs _6 Total (40 - 48) Dyspnea Score 19   24._7 How bad is your cough? _8 0  How bad is your fatigue _9 nauea     1 0 0  vomit     0 0 0  diarrhea     2 2 >5  anxiety     0 3 0  depression     0 2 0       Simple office walk 185 feet x  3 laps goal with forehead probe 04/08/2019  04/29/2019  07/16/2019  10/15/2019  12/31/2019  03/24/2020   O2 used RA RA RA bRL office RA brl office ra ra  Number laps completed 3  3  Did oly 2 of 3 laps desatured in 3/4th of 1 lap   Comments about pace - stopped at 2 Sopped at end of 2nd lap      Resting Pulse Ox/HR 96% and 104/min 95% and HR 80.min 94% and HR 107/min 96% and HR 80./min 94% RA ->  90% RA at rest  Final Pulse Ox/HR 82% and 120/min 85% and 108/min  87% and HR 132.min 86% and HR 115.min 86% with jsue 3/4th of 1 laps ad HR 86   Desaturated </= 88% yes yes yes yes    Desaturated <= 3% points yes yes yes yes    Got Tachycardic >/= 90/min yes yes yes yes    Symptoms at end of test Very dyspneice Moderate dyspnea Moderate dyspnea Very dyspneic    Miscellaneous comments Started ofev mid oct 2020  Recovered quickly Took few minutes to recover  Needed 4LNC pulse ox to correct to 85-89%        Results for DEVLYN, PARISH (MRN [id number]) as of 04/08/2019 11:19  Ref. Range 07/15/2014 11:47 01/03/2015 09:40 03/17/2015 13:03 10/23/2015 08:45 03/12/2019 16:47 - startr ofev due to progerssiv diseae 07/16/2019   [id number]  FVC-Pre Latest Units: L 1.59 1.52 1.47 1.44 1.08 1.08    FVC-%Pred-Pre Latest Units: % 61 59 57 56 44   48%  Results for MARCINA, KINNISON (MRN [id number]) as of 04/08/2019 11:19  Ref. Range 02/15/2014 11:51 07/15/2014 11:47 01/03/2015 09:40 03/17/2015 13:03 10/23/2015 08:45 03/12/2019 16:47 - start ofev 07/16/2019  [id number]  DLCO unc Latest Units: ml/min/mmHg 14.30 13.65 13.66 12.11 12.65 8.71 8.57 x  DLCO unc % pred Latest Units: % 66 63 63 56 58 49 47% x   ROS - per HPI  ECHO Nov 2019  Study Conclusions   - Left ventricle: The cavity size was normal. Wall thickness was  increased in a pattern of mild LVH. Systolic function was  vigorous. The estimated ejection fraction was in the range of 65%  to 70%. Wall motion was normal; there were no regional wall  motion abnormalities. Doppler parameters are consistent with  abnormal left ventricular relaxation (grade 1 diastolic  dysfunction). Doppler parameters are consistent with  indeterminate ventricular filling pressure.  - Aortic valve: Mildly calcified annulus. Trileaflet.  - Mitral valve: Mildly calcified annulus.  - Left atrium: The atrium was mildly dilated.  - Atrial septum: No defect or patent foramen ovale was identified.  - Tricuspid valve: There was moderate regurgitation.  - Pulmonary arteries: PA peak pressure: 63 mm Hg (S).  - Pericardium, extracardiac: A trivial pericardial effusion was  identified.      ROS - per HPI     has a past medical history of Anginal pain ([place]), Cancer ([place]), CHF (congestive heart failure) ([place]) (01/22/2012), Chronic fatigue (07/15/2014), CVA (cerebral vascular accident) ([place]) (04/18/2018), Family history of  pulmonary fibrosis, GERD (gastroesophageal reflux disease) (05/12/2017), Hypertension, Hypothyroidism, ILD (interstitial lung disease) ([place]) (02/04/2012), IPF (idiopathic pulmonary fibrosis) ([place]) (2013), Mild intermittent asthma (01/29/2016), Pelvic relaxation, Prolapse of bladder, Shortness of breath, and Thyroid disease.   reports that she has never smoked. She has never used smokeless tobacco.  Past Surgical History:  Procedure Laterality Date  . ANTERIOR AND POSTERIOR REPAIR  12/05/2011   Procedure: ANTERIOR (CYSTOCELE) AND POSTERIOR REPAIR (RECTOCELE);  Surgeon: [name], MD;  Location: [facility];  Service: Gynecology;  Laterality: N/A;  with cysto  . Bladder tact  15 years ago  . COLONOSCOPY WITH PROPOFOL N/A  04/20/2019   Procedure: COLONOSCOPY WITH PROPOFOL;  Surgeon: [name], MD;  Location: [facility] ENDOSCOPY;  Service: Gastroenterology;  Laterality: N/A;  . LEFT HEART CATHETERIZATION WITH CORONARY ANGIOGRAM N/A 01/22/2012   Procedure: LEFT HEART CATHETERIZATION WITH CORONARY ANGIOGRAM;  Surgeon: [name], MD;  Location: [facility] CATH LAB;  Service: Cardiovascular;  Laterality: N/A;  . NECK SURGERY  2010  . PSEUDOANERYSM COMPRESSION Right 11/29/2019   Procedure: PSEUDOANERYSM COMPRESSION;  Surgeon: Algernon Huxley, MD;  Location: [facility];  Service: Cardiovascular;  Laterality: Right;  . RIGHT HEART CATH N/A 11/19/2019   Procedure: RIGHT HEART CATH;  Surgeon: [name], MD;  Location: [facility];  Service: Cardiovascular;  Laterality: N/A;  . SHOULDER SURGERY    . thyroid disease    . THYROID LOBECTOMY Left 07/17/2018   Procedure: LEFT THYROID LOBECTOMY;  Surgeon: [name], MD;  Location: [facility];  Service: General;  Laterality: Left;  . TONSILLECTOMY    . VAGINAL HYSTERECTOMY  15 years ago    Allergies  Allergen Reactions  . Crestor [Rosuvastatin Calcium] Other (See Comments)    Myalgias, muscle weakness  . Dilaudid [Hydromorphone] Nausea And Vomiting     Immunization History  Administered Date(s) Administered  . Fluad Quad(high Dose 65+) 03/02/2020  . Influenza Split 02/22/2012, 03/01/2013, 02/08/2014, 01/27/2015  . Influenza Whole 04/11/2011, 02/10/2017  . Influenza, High Dose Seasonal PF 02/16/2017, 01/20/2019  . Influenza,inj,Quad PF,6+ Mos 03/11/2016  . Influenza-Unspecified 01/26/2018  . PFIZER SARS-COV-2 Vaccination 06/30/2019, 07/21/2019  . Pneumococcal Conjugate-13 03/29/2014  . Pneumococcal Polysaccharide-23 12/08/2017  . Pneumococcal-Unspecified 04/11/2011, 05/13/2016  . Td 08/30/2008  . Tdap 11/16/2017  . Zoster 08/07/2011  . Zoster Recombinat (Shingrix) 09/20/2016, 02/19/2017    Family History  Problem Relation Age of Onset  . Thyroid disease Mother   . Hypertension Mother   . Coronary artery disease Mother   . Dementia Mother   . Stroke Mother   . Aneurysm Father   . Pulmonary fibrosis Sister   . Stroke Other   . Sleep apnea Other   . Liver disease Brother   . Pulmonary fibrosis Brother   . Stroke Maternal Grandmother   . Dementia Maternal Grandmother   . Heart attack Maternal Grandfather 67  . COPD Son      Current Outpatient Medications:  .  acetaminophen (TYLENOL) 500 MG tablet, Take 1,000 mg by mouth every 6 (six) hours as needed for mild pain or headache. , Disp: , Rfl:  .  amLODipine (NORVASC) 5 MG tablet, TAKE 1 TABLET BY MOUTH EVERY DAY FOR BLOOD PRESSURE (Patient taking differently: Take 5 mg by mouth daily. ), Disp: 90 tablet, Rfl: 1 .  atorvastatin (LIPITOR) 40 MG tablet, TAKE 1 TABLET (40 MG TOTAL) BY MOUTH AT BEDTIME. FOR CHOLESTEROL., Disp: 90 tablet, Rfl: 3 .  CALCIUM CARBONATE-VITAMIN D PO, Take 1 tablet by mouth daily. , Disp: , Rfl:  .  clopidogrel (PLAVIX) 75 MG tablet, Take 1 tablet (75 mg total) by mouth daily. For stroke prevention., Disp: 90 tablet, Rfl: 3 .  CRANBERRY PO, Take 1 capsule by mouth every Monday, Wednesday, and Friday. , Disp: , Rfl:  .  ferrous sulfate 325 (65 FE)  MG tablet, Take 325 mg by mouth every evening. , Disp: , Rfl:  .  levothyroxine (SYNTHROID) 88 MCG tablet, Take 1 tablet by mouth every morning on an empty stomach with water only.  No food or other medications for 30 minutes., Disp: 30 tablet, Rfl: 1 .  loratadine (CLARITIN) 10 MG tablet, Take 10 mg by mouth daily. , Disp: , Rfl:  .  metoprolol succinate (TOPROL-XL) 25 MG 24 hr tablet, TAKE 1 TABLET BY MOUTH EVERY DAY, Disp: 90 tablet, Rfl: 3 .  Multiple Vitamins-Minerals (CENTRUM SILVER PO), Take 1 tablet by mouth daily., Disp: , Rfl:  .  OFEV 150 MG CAPS, Take 1 capsule (150 mg total) by mouth every 12 (twelve) hours. As Directed, Disp: 60 capsule, Rfl: 3 .  omeprazole (PRILOSEC) 20 MG capsule, TAKE 1 CAPSULE BY MOUTH EVERY DAY (Patient taking differently: Take 20 mg by mouth daily. ), Disp: 90 capsule, Rfl: 1      Objective:   Vitals:   03/24/20 1122  BP: 118/60  Pulse: (!) 106  Temp: (!) 97.5 F (36.4 C)  TempSrc: Temporal  SpO2: 97%  Weight: 117 lb 6.4 oz (53.3 kg)  Height: 5' 2.5" (1.588 m)    Estimated body mass index is 21.13 kg/m as calculated from the following:   Height as of this encounter: 5' 2.5" (1.588 m).   Weight as of this encounter: 117 lb 6.4 oz (53.3 kg).  _0 @  Filed Weights   03/24/20 1122  Weight: 117 lb 6.4 oz (53.3 kg)     Physical Exam Frail female looking stronger than in the past.  Oxygen on crackles present.  Abdomen soft.  Pleasant she drove herself here.  She says she is still able to drive.  Alert and oriented x3      Assessment:       ICD-10-CM   1. Chronic respiratory failure with hypoxia (HCC)  J96.11   2. IPF (idiopathic pulmonary fibrosis) / IPAF  J84.112   3. 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 Diarrhea due to drug  K52.1   5. Drug-induced weight loss  R63.4    T50.905A   6. Irregular heart beat  I49.9   7. 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 This includes one or more of the following on this same day 03/24/2020: pre-charting, chart review, note writing, documentation discussion of test results, diagnostic or treatment recommendations, prognosis, risks and benefits  of management options, instructions, education, compliance or risk-factor reduction. It excludes time spent by the [facility] or office staff in the care of the patient. Actual time 27 min)   SIGNATURE    Dr. Brand Males, M.D., F.C.C.P,  Pulmonary and Critical Care Medicine Staff Physician, [facility] Director - Interstitial Lung Disease  Program  [facility] [place], [place], [address]  Pager: [phone], If no answer or between  15:00h - 7:00h: call [phone] Telephone: [phone]  12:53  PM 03/24/2020

## 2020-03-24 NOTE — Patient Instructions (Addendum)
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

## 2020-03-25 ENCOUNTER — Other Ambulatory Visit: Payer: Self-pay | Admitting: Primary Care

## 2020-03-25 DIAGNOSIS — E890 Postprocedural hypothyroidism: Secondary | ICD-10-CM

## 2020-03-31 ENCOUNTER — Other Ambulatory Visit: Payer: Self-pay | Admitting: Emergency Medicine

## 2020-03-31 ENCOUNTER — Other Ambulatory Visit: Payer: Self-pay

## 2020-03-31 ENCOUNTER — Encounter: Payer: Self-pay | Admitting: Cardiology

## 2020-03-31 ENCOUNTER — Ambulatory Visit: Payer: Medicare PPO | Admitting: Cardiology

## 2020-03-31 VITALS — BP 133/81 | HR 93 | Ht 62.5 in | Wt 123.4 lb

## 2020-03-31 DIAGNOSIS — I2723 Pulmonary hypertension due to lung diseases and hypoxia: Secondary | ICD-10-CM

## 2020-03-31 DIAGNOSIS — Z7189 Other specified counseling: Secondary | ICD-10-CM

## 2020-03-31 DIAGNOSIS — J84112 Idiopathic pulmonary fibrosis: Secondary | ICD-10-CM

## 2020-03-31 DIAGNOSIS — I10 Essential (primary) hypertension: Secondary | ICD-10-CM

## 2020-03-31 DIAGNOSIS — R002 Palpitations: Secondary | ICD-10-CM

## 2020-03-31 DIAGNOSIS — J849 Interstitial pulmonary disease, unspecified: Secondary | ICD-10-CM

## 2020-03-31 DIAGNOSIS — Z8673 Personal history of transient ischemic attack (TIA), and cerebral infarction without residual deficits: Secondary | ICD-10-CM

## 2020-03-31 DIAGNOSIS — R0602 Shortness of breath: Secondary | ICD-10-CM

## 2020-03-31 NOTE — Patient Instructions (Signed)
Medication Instructions:  Your Physician recommend you continue on your current medication as directed.    *If you need a refill on your cardiac medications before your next appointment, please call your pharmacy*   Lab Work: None ordered  Testing/Procedures: None ordered    Follow-Up: At [facility], you and your health needs are our priority.  As part of our continuing mission to provide you with exceptional heart care, we have created designated Provider Care Teams.  These Care Teams include your primary Cardiologist (physician) and Advanced Practice Providers (APPs -  Physician Assistants and Nurse Practitioners) who all work together to provide you with the care you need, when you need it.  We recommend signing up for the patient portal called "MyChart".  Sign up information is provided on this After Visit Summary.  MyChart is used to connect with patients for Virtual Visits (Telemedicine).  Patients are able to view lab/test results, encounter notes, upcoming appointments, etc.  Non-urgent messages can be sent to your provider as well.   To learn more about what you can do with MyChart, go to [email or web address].    Your next appointment:   4 month(s)  The format for your next appointment:   In Person  Provider:   Buford Dresser, MD

## 2020-03-31 NOTE — Progress Notes (Signed)
Cardiology Office Note:    Date:  03/31/2020   ID:  Tina Calderon, DOB 07/01/1940, MRN [id number]  PCP:  [name], NP  Cardiologist:  Buford Dresser, MD  Referring MD: [name], NP   CC: Follow up  History of Present Illness:    Tina Calderon is a 79 y.o. female with a hx of pulmonary fibrosis who is seen in follow up for evaluation and management of tachycardia, fatigue, exertional intolerance.  Summary from initial visit 01/22/18: She presented to [facility] ER on 01/12/18 with palpitations and shortness of breath. At pulmonary rehab that morning, her heart rate was noted to be 120 bpm (patient states this was at rest). In the ER, her ECG showed sinus tachycardia at 104 bpm, CT was negative for PE, negative troponin. She feels exhausted with basic everyday activities. For instance, she cooks breakfast every morning for herself and her husband. By the time she is done, she has no appetite. Often she has to lie down to rest for a time after. She is supposed to do about 50 minutes of near continuous cardiovascular activity at pulmonary rehab, and she is completely exhausted by the end of this. She endorses good food and fluid intake despite her fatigue. No infectious symptoms or recent illnesses.  Today: Having a lot of diarrhea, stopping nintedanib because of this. Using O2 all the time.   Has fatigue with minimal exertion. Feels like she has not energy. Grandchildren clean for her. She is caring for her husband, who is recovering post hospitalization (he is 64).   We discussed goals of care today. She has DNR/DNI forms. We discussed palliative care. She will discuss this with her family.  Denies chest pain, change in shortness of breath at rest or with normal exertion. No PND, orthopnea, or unexpected weight gain. No syncope or palpitations.  Past Medical History:  Diagnosis Date  . Anginal pain ([place])   . Cancer ([place])   . CHF (congestive heart failure)  ([place]) 01/22/2012  . Chronic fatigue 07/15/2014  . CVA (cerebral vascular accident) ([place]) 04/18/2018  . Family history of pulmonary fibrosis   . GERD (gastroesophageal reflux disease) 05/12/2017  . Hypertension   . Hypothyroidism   . ILD (interstitial lung disease) ([place]) 02/04/2012  . IPF (idiopathic pulmonary fibrosis) ([place]) 2013  . Mild intermittent asthma 01/29/2016  . Pelvic relaxation   . Prolapse of bladder   . Shortness of breath   . Thyroid disease     Past Surgical History:  Procedure Laterality Date  . ANTERIOR AND POSTERIOR REPAIR  12/05/2011   Procedure: ANTERIOR (CYSTOCELE) AND POSTERIOR REPAIR (RECTOCELE);  Surgeon: [name], MD;  Location: [facility];  Service: Gynecology;  Laterality: N/A;  with cysto  . Bladder tact  15 years ago  . COLONOSCOPY WITH PROPOFOL N/A 04/20/2019   Procedure: COLONOSCOPY WITH PROPOFOL;  Surgeon: [name], MD;  Location: [facility] ENDOSCOPY;  Service: Gastroenterology;  Laterality: N/A;  . LEFT HEART CATHETERIZATION WITH CORONARY ANGIOGRAM N/A 01/22/2012   Procedure: LEFT HEART CATHETERIZATION WITH CORONARY ANGIOGRAM;  Surgeon: [name], MD;  Location: [facility] CATH LAB;  Service: Cardiovascular;  Laterality: N/A;  . NECK SURGERY  2010  . PSEUDOANERYSM COMPRESSION Right 11/29/2019   Procedure: PSEUDOANERYSM COMPRESSION;  Surgeon: Algernon Huxley, MD;  Location: [facility] CV LAB;  Service: Cardiovascular;  Laterality: Right;  . RIGHT HEART CATH N/A 11/19/2019   Procedure: RIGHT HEART CATH;  Surgeon: [name], MD;  Location: [facility]  INVASIVE CV LAB;  Service: Cardiovascular;  Laterality: N/A;  . SHOULDER SURGERY    . thyroid disease    . THYROID LOBECTOMY Left 07/17/2018   Procedure: LEFT THYROID LOBECTOMY;  Surgeon: [name], MD;  Location: [facility];  Service: General;  Laterality: Left;  . TONSILLECTOMY    . VAGINAL HYSTERECTOMY  15 years ago    Current Medications: Current Outpatient Medications on File Prior to Visit  Medication Sig  .  acetaminophen (TYLENOL) 500 MG tablet Take 1,000 mg by mouth every 6 (six) hours as needed for mild pain or headache.   [name] amLODipine (NORVASC) 5 MG tablet TAKE 1 TABLET BY MOUTH EVERY DAY FOR BLOOD PRESSURE (Patient taking differently: Take 5 mg by mouth daily. )  . atorvastatin (LIPITOR) 40 MG tablet TAKE 1 TABLET (40 MG TOTAL) BY MOUTH AT BEDTIME. FOR CHOLESTEROL.  [name] CALCIUM CARBONATE-VITAMIN D PO Take 1 tablet by mouth daily.   . clopidogrel (PLAVIX) 75 MG tablet Take 1 tablet (75 mg total) by mouth daily. For stroke prevention.  [name] CRANBERRY PO Take 1 capsule by mouth every Monday, Wednesday, and Friday.   . ferrous sulfate 325 (65 FE) MG tablet Take 325 mg by mouth every evening.   [name] levothyroxine (SYNTHROID) 88 MCG tablet TAKE 1 TABLET BY MOUTH EVERY MORNING ON AN EMPTY STOMACH WITH WATER ONLY. NO FOOD OR OTHER MEDICATIONS FOR 30 MINUTES.  [name] loratadine (CLARITIN) 10 MG tablet Take 10 mg by mouth daily.   . metoprolol succinate (TOPROL-XL) 25 MG 24 hr tablet TAKE 1 TABLET BY MOUTH EVERY DAY  . Multiple Vitamins-Minerals (CENTRUM SILVER PO) Take 1 tablet by mouth daily.  [name] OFEV 150 MG CAPS Take 1 capsule (150 mg total) by mouth every 12 (twelve) hours. As Directed  . omeprazole (PRILOSEC) 20 MG capsule TAKE 1 CAPSULE BY MOUTH EVERY DAY (Patient taking differently: Take 20 mg by mouth daily. )   No current facility-administered medications on file prior to visit.     Allergies:   Crestor [rosuvastatin calcium] and Dilaudid [hydromorphone]   Social History   Tobacco Use  . Smoking status: Never Smoker  . Smokeless tobacco: Never Used  Vaping Use  . Vaping Use: Never used  Substance Use Topics  . Alcohol use: No  . Drug use: No    Family History: The patient's family history includes Aneurysm in her father; COPD in her son; Coronary artery disease in her mother; Dementia in her maternal grandmother and mother; Heart attack (age of onset: 49) in her maternal grandfather; Hypertension  in her mother; Liver disease in her brother; Pulmonary fibrosis in her brother and sister; Sleep apnea in an other family member; Stroke in her maternal grandmother, mother, and another family member; Thyroid disease in her mother.  ROS:   ROS as per HPI, otherwise unremarkable  EKGs/Labs/Other Studies Reviewed:    The following studies were reviewed today: [place] 2019-11-22 RA: A-wave 7, V wave 6, mean 2 RV: 62/4 PA: 68/31; mean 46 PW: A-wave 7, V wave 8, mean 6  Oxygen saturation in the AO 100% and in the PA 80% with the patient on 3 L of oxygen.  By the Fick method, cardiac output 6.8 L/min and cardiac index 4.3 L/min/m. By the thermodilution method, cardiac output 5.7 L/min and cardiac index 3.6 L/min/m.  PVR: By the Fick method 5.9 WU and by the thermodilution method 7.1 WU  MPI 04/01/18  Nuclear stress EF: 68%.  Blood pressure demonstrated a normal response  to exercise.  There was no ST segment deviation noted during stress.  The study is normal.  This is a low risk study.  The left ventricular ejection fraction is hyperdynamic (>65%).  Echo 04/19/18 - Left ventricle: The cavity size was normal. Wall thickness was   increased in a pattern of mild LVH. Systolic function was   vigorous. The estimated ejection fraction was in the range of 65%   to 70%. Wall motion was normal; there were no regional wall   motion abnormalities. Doppler parameters are consistent with   abnormal left ventricular relaxation (grade 1 diastolic   dysfunction). Doppler parameters are consistent with   indeterminate ventricular filling pressure. - Aortic valve: Mildly calcified annulus. Trileaflet. - Mitral valve: Mildly calcified annulus. - Left atrium: The atrium was mildly dilated. - Atrial septum: No defect or patent foramen ovale was identified. - Tricuspid valve: There was moderate regurgitation. - Pulmonary arteries: PA peak pressure: 63 mm Hg (S). - Pericardium, extracardiac: A  trivial pericardial effusion was   identified.  Echo 01/31/18 Study Conclusions  - Left ventricle: The cavity size was normal. Wall thickness was   normal. Systolic function was normal. The estimated ejection   fraction was in the range of 60% to 65%. Wall motion was normal;   there were no regional wall motion abnormalities. Doppler   parameters are consistent with abnormal left ventricular   relaxation (grade 1 diastolic dysfunction). - Aortic valve: Mildly calcified annulus. Normal thickness   leaflets. Valve area (VTI): 1.83 cm^2. Valve area (Vmax): 2.06   cm^2. Valve area (Vmean): 1.74 cm^2. - Mitral valve: Mildly calcified annulus. Normal thickness leaflets   . - Left atrium: The atrium was severely dilated. - Right ventricle: The cavity size was mildly dilated. - Technically adequate study.  Elevated RA pressures, RV-RA gradient 47 mmHg.   Monitor 01/29/18 Ventricular tachycardia-nonsustained-up to 5 beats Atrial tachycardia-37 episodes    Symptoms of fluttering sinus rhythm;                              rare PAC                             Isolated PVC Symptoms of shortness of breath PACs/PVCs  Long term monitor reviewed. All of her patient triggered events are sinus rhythm, occasionally tachycardia, rare isolated ectopy. She had one episode of 5 beats of NSVT without symptoms, and she had one brief episode of 3 beats of SVT. Rest are isolated atrial ectopy and rare PVCs.  CT angio chest 01/12/18 IMPRESSION: 1. No acute findings. No pulmonary embolism seen. No evidence of pneumonia or pulmonary edema. 2. 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 3. Large hiatal hernia, stable. 4. Cardiomegaly. 5. Coronary artery calcifications.  EKG:  EKG personally reviewed,  ECG from 11/19/19 is NSR, with PACs and low anteroseptal forces  Recent  Labs: 10/15/2019: ALT 27 11/29/2019: BUN 13; Creatinine, Ser 0.70; Potassium 4.4; Sodium 138 02/02/2020: Hemoglobin 13.2; Platelets 161.0; TSH 0.17  Recent Lipid Panel    Component Value Date/Time   CHOL 119 02/02/2020 0955   TRIG 88.0 02/02/2020 0955   HDL 46.70 02/02/2020 0955   CHOLHDL 3 02/02/2020 0955   VLDL 17.6 02/02/2020 0955   LDLCALC 54 02/02/2020 0955    Physical Exam:  VS:  BP 133/81   Pulse 93   Ht 5' 2.5" (1.588 m)   Wt 123 lb 6.4 oz (56 kg)   SpO2 90%   BMI 22.21 kg/m     Wt Readings from Last 3 Encounters:  03/31/20 123 lb 6.4 oz (56 kg)  03/24/20 117 lb 6.4 oz (53.3 kg)  02/02/20 116 lb 12 oz (53 kg)   GEN: frail elderly woman, O2 [place] in place HEENT: Normal, moist mucous membranes NECK: No JVD CARDIAC: regular rhythm, normal S1 and S2, no rubs or gallops. No murmur. VASCULAR: Radial and DP pulses 2+ bilaterally. No carotid bruits RESPIRATORY:  Fine velcro crackles in lungs bilaterally ABDOMEN: Soft, non-tender, non-distended MUSCULOSKELETAL:  Ambulates independently SKIN: Warm and dry, trivial bilateral LE edema NEUROLOGIC:  Alert and oriented x 3. No focal neuro deficits noted. PSYCHIATRIC:  Normal affect   ASSESSMENT:    1. Shortness of breath   2. 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 IPF (idiopathic pulmonary fibrosis) ([place])   4. Goals of care, counseling/discussion   5. Heart palpitations   6. Essential hypertension   7. History of CVA (cerebrovascular accident)    PLAN:    Shortness of breath, chronic hypoxemic respiratory failure, severe pulmonary hypertension: secondary to her IPF -follows closely with Dr. [name] -had severe diarrhea on recent IPF treatment, stopping -on O2 by nasal cannula chronically -she is discussing the next steps with her family. Has DNR/DNI paperwork. We discussed what palliative care is, how it focuses on symptoms. She does not want any more invasive tests but is not sure about  palliative care. She will discuss with her family. We discussed goals of care and how to identify her priorities.  palpitations/tachycardia:  -well controlled on metoprolol without worsening her breathing -stable symptoms, though exertion does elevated her heart rate rapidly  Hypertension: -near goal today -continue amlodipine, metoprolol.  Secondary prevention (history of CVA): -recommend heart healthy/Mediterranean diet, with whole grains, fruits, vegetable, fish, lean meats, nuts, and olive oil. Limit salt. -recommend moderate walking, 3-5 times/week for 30-50 minutes each session. Aim for at least 150 minutes.week. Goal should be pace of 3 miles/hours, or walking 1.5 miles in 30 minutes -recommend avoidance of tobacco products. Avoid excess alcohol. -doing well on clopidogrel and atorvastatin  Plan for follow up: 4 mos or sooner PRN  Buford Dresser, MD, PhD [place]  [facility]   Medication Adjustments/Labs and Tests Ordered: Current medicines are reviewed at length with the patient today.  Concerns regarding medicines are outlined above.  No orders of the defined types were placed in this encounter.  No orders of the defined types were placed in this encounter.   Patient Instructions  Medication Instructions:  Your Physician recommend you continue on your current medication as directed.    *If you need a refill on your cardiac medications before your next appointment, please call your pharmacy*   Lab Work: None ordered  Testing/Procedures: None ordered    Follow-Up: At [facility], you and your health needs are our priority.  As part of our continuing mission to provide you with exceptional heart care, we have created designated Provider Care Teams.  These Care Teams include your primary Cardiologist (physician) and Advanced Practice Providers (APPs -  Physician Assistants and Nurse Practitioners) who all work together to provide you with the care  you need, when you need it.  We recommend signing up for the patient portal called "MyChart".  Sign up  information is provided on this After Visit Summary.  MyChart is used to connect with patients for Virtual Visits (Telemedicine).  Patients are able to view lab/test results, encounter notes, upcoming appointments, etc.  Non-urgent messages can be sent to your provider as well.   To learn more about what you can do with MyChart, go to [email or web address].    Your next appointment:   4 month(s)  The format for your next appointment:   In Person  Provider:   Buford Dresser, MD      Signed, Buford Dresser, MD PhD 03/31/2020    [place]

## 2020-04-04 ENCOUNTER — Other Ambulatory Visit: Payer: Self-pay | Admitting: Primary Care

## 2020-04-04 DIAGNOSIS — E890 Postprocedural hypothyroidism: Secondary | ICD-10-CM

## 2020-04-05 ENCOUNTER — Other Ambulatory Visit: Payer: Self-pay

## 2020-04-05 ENCOUNTER — Other Ambulatory Visit (INDEPENDENT_AMBULATORY_CARE_PROVIDER_SITE_OTHER): Payer: Medicare PPO

## 2020-04-05 DIAGNOSIS — E890 Postprocedural hypothyroidism: Secondary | ICD-10-CM

## 2020-04-05 LAB — TSH: TSH: 1.4 u[IU]/mL (ref 0.35–4.50)

## 2020-04-06 ENCOUNTER — Other Ambulatory Visit: Payer: Self-pay

## 2020-04-06 DIAGNOSIS — E890 Postprocedural hypothyroidism: Secondary | ICD-10-CM

## 2020-04-06 MED ORDER — LEVOTHYROXINE SODIUM 88 MCG PO TABS: ORAL_TABLET | ORAL | 3 refills | Status: AC

## 2020-04-07 ENCOUNTER — Encounter: Payer: Self-pay | Admitting: Cardiology

## 2020-04-07 DIAGNOSIS — I2723 Pulmonary hypertension due to lung diseases and hypoxia: Secondary | ICD-10-CM | POA: Insufficient documentation

## 2020-05-05 ENCOUNTER — Other Ambulatory Visit: Payer: Self-pay | Admitting: Primary Care

## 2020-05-05 DIAGNOSIS — I10 Essential (primary) hypertension: Secondary | ICD-10-CM

## 2020-05-24 DIAGNOSIS — I5032 Chronic diastolic (congestive) heart failure: Secondary | ICD-10-CM | POA: Diagnosis not present

## 2020-05-24 DIAGNOSIS — R0602 Shortness of breath: Secondary | ICD-10-CM | POA: Diagnosis not present

## 2020-05-24 DIAGNOSIS — I272 Pulmonary hypertension, unspecified: Secondary | ICD-10-CM | POA: Diagnosis not present

## 2020-05-24 DIAGNOSIS — J9611 Chronic respiratory failure with hypoxia: Secondary | ICD-10-CM | POA: Diagnosis not present

## 2020-05-24 DIAGNOSIS — R29898 Other symptoms and signs involving the musculoskeletal system: Secondary | ICD-10-CM | POA: Diagnosis not present

## 2020-05-24 DIAGNOSIS — I639 Cerebral infarction, unspecified: Secondary | ICD-10-CM | POA: Diagnosis not present

## 2020-06-07 DIAGNOSIS — H43399 Other vitreous opacities, unspecified eye: Secondary | ICD-10-CM | POA: Diagnosis not present

## 2020-06-24 DIAGNOSIS — J9611 Chronic respiratory failure with hypoxia: Secondary | ICD-10-CM | POA: Diagnosis not present

## 2020-06-24 DIAGNOSIS — I272 Pulmonary hypertension, unspecified: Secondary | ICD-10-CM | POA: Diagnosis not present

## 2020-06-24 DIAGNOSIS — I5032 Chronic diastolic (congestive) heart failure: Secondary | ICD-10-CM | POA: Diagnosis not present

## 2020-06-24 DIAGNOSIS — I639 Cerebral infarction, unspecified: Secondary | ICD-10-CM | POA: Diagnosis not present

## 2020-06-24 DIAGNOSIS — R29898 Other symptoms and signs involving the musculoskeletal system: Secondary | ICD-10-CM | POA: Diagnosis not present

## 2020-06-24 DIAGNOSIS — R0602 Shortness of breath: Secondary | ICD-10-CM | POA: Diagnosis not present

## 2020-07-07 ENCOUNTER — Telehealth: Payer: Self-pay

## 2020-07-07 ENCOUNTER — Other Ambulatory Visit: Payer: Medicare PPO

## 2020-07-07 NOTE — Telephone Encounter (Signed)
I spoke with pt; when pt first got up this morning she was dizzy and did not think she should drive. Pt said she laid back down and now she feels OK with no dizziness but changed lab appt to 07/12/20 at 8:20. Pt will cb if needed if dizziness reoccurs. FYI to Gentry Fitz NP.

## 2020-07-07 NOTE — Telephone Encounter (Signed)
[place] Night - Client Nonclinical Telephone Record AccessNurse Client [place] Night - Client Client Site [place] Physician Alma Friendly - NP Contact Type Call Who Is Calling Patient / Member / Family / Caregiver Caller Name [place] Phone Number [phone] Patient Name Tina Calderon Patient DOB [birth year] Call Type Message Only Information Provided Reason for Call Request to Reschedule Office Appointment Initial Comment Caller states she wanted to reschedule it for Monday if possible. Caller states she feels like her head is swimming and she does not think it is safe to drive and she is on oxygen. Additional Comment Caller declined triage. Caller states she apologizes for calling so close to her appointment time. Disp. Time Disposition Final User 07/07/2020 7:52:37 AM General Information Provided Yes Josephine Cables Call Closed By: Josephine Cables Transaction Date/Time: 07/07/2020 7:47:59 AM (ET)

## 2020-07-09 NOTE — Telephone Encounter (Signed)
Noted and agree. 

## 2020-07-12 ENCOUNTER — Other Ambulatory Visit (INDEPENDENT_AMBULATORY_CARE_PROVIDER_SITE_OTHER): Payer: Medicare PPO

## 2020-07-12 ENCOUNTER — Other Ambulatory Visit: Payer: Self-pay

## 2020-07-12 DIAGNOSIS — E890 Postprocedural hypothyroidism: Secondary | ICD-10-CM | POA: Diagnosis not present

## 2020-07-12 LAB — TSH: TSH: 1.51 u[IU]/mL (ref 0.35–4.50)

## 2020-07-25 DIAGNOSIS — I639 Cerebral infarction, unspecified: Secondary | ICD-10-CM | POA: Diagnosis not present

## 2020-07-25 DIAGNOSIS — I5032 Chronic diastolic (congestive) heart failure: Secondary | ICD-10-CM | POA: Diagnosis not present

## 2020-07-25 DIAGNOSIS — R29898 Other symptoms and signs involving the musculoskeletal system: Secondary | ICD-10-CM | POA: Diagnosis not present

## 2020-07-25 DIAGNOSIS — I272 Pulmonary hypertension, unspecified: Secondary | ICD-10-CM | POA: Diagnosis not present

## 2020-07-25 DIAGNOSIS — J9611 Chronic respiratory failure with hypoxia: Secondary | ICD-10-CM | POA: Diagnosis not present

## 2020-07-25 DIAGNOSIS — R0602 Shortness of breath: Secondary | ICD-10-CM | POA: Diagnosis not present

## 2020-08-02 ENCOUNTER — Ambulatory Visit: Payer: Medicare PPO | Admitting: Cardiology

## 2020-08-22 DIAGNOSIS — I5032 Chronic diastolic (congestive) heart failure: Secondary | ICD-10-CM | POA: Diagnosis not present

## 2020-08-22 DIAGNOSIS — R29898 Other symptoms and signs involving the musculoskeletal system: Secondary | ICD-10-CM | POA: Diagnosis not present

## 2020-08-22 DIAGNOSIS — I272 Pulmonary hypertension, unspecified: Secondary | ICD-10-CM | POA: Diagnosis not present

## 2020-08-22 DIAGNOSIS — J9611 Chronic respiratory failure with hypoxia: Secondary | ICD-10-CM | POA: Diagnosis not present

## 2020-08-22 DIAGNOSIS — I639 Cerebral infarction, unspecified: Secondary | ICD-10-CM | POA: Diagnosis not present

## 2020-08-22 DIAGNOSIS — R0602 Shortness of breath: Secondary | ICD-10-CM | POA: Diagnosis not present

## 2020-09-08 ENCOUNTER — Encounter: Payer: Self-pay | Admitting: Cardiology

## 2020-09-08 ENCOUNTER — Ambulatory Visit: Payer: Medicare PPO | Admitting: Cardiology

## 2020-09-08 ENCOUNTER — Other Ambulatory Visit: Payer: Self-pay

## 2020-09-08 VITALS — BP 122/70 | HR 96 | Ht 62.0 in | Wt 115.2 lb

## 2020-09-08 DIAGNOSIS — J9611 Chronic respiratory failure with hypoxia: Secondary | ICD-10-CM

## 2020-09-08 DIAGNOSIS — I2723 Pulmonary hypertension due to lung diseases and hypoxia: Secondary | ICD-10-CM

## 2020-09-08 DIAGNOSIS — J84112 Idiopathic pulmonary fibrosis: Secondary | ICD-10-CM

## 2020-09-08 DIAGNOSIS — I10 Essential (primary) hypertension: Secondary | ICD-10-CM | POA: Diagnosis not present

## 2020-09-08 DIAGNOSIS — J849 Interstitial pulmonary disease, unspecified: Secondary | ICD-10-CM | POA: Diagnosis not present

## 2020-09-08 DIAGNOSIS — I5032 Chronic diastolic (congestive) heart failure: Secondary | ICD-10-CM

## 2020-09-08 NOTE — Patient Instructions (Addendum)
Medication Instructions:  Your Physician recommend you continue on your current medication as directed.    *If you need a refill on your cardiac medications before your next appointment, please call your pharmacy*   Lab Work: None   Testing/Procedures: None   Follow-Up: At [facility], you and your health needs are our priority.  As part of our continuing mission to provide you with exceptional heart care, we have created designated Provider Care Teams.  These Care Teams include your primary Cardiologist (physician) and Advanced Practice Providers (APPs -  Physician Assistants and Nurse Practitioners) who all work together to provide you with the care you need, when you need it.  We recommend signing up for the patient portal called "MyChart".  Sign up information is provided on this After Visit Summary.  MyChart is used to connect with patients for Virtual Visits (Telemedicine).  Patients are able to view lab/test results, encounter notes, upcoming appointments, etc.  Non-urgent messages can be sent to your provider as well.   To learn more about what you can do with MyChart, go to [email or web address].    Your next appointment:   3 month(s) @ [address] [place], [place] [address]   The format for your next appointment:   In Person  Provider:   Buford Dresser, MD

## 2020-09-08 NOTE — Progress Notes (Signed)
Cardiology Office Note:    Date:  09/08/2020   ID:  Tina Calderon, DOB 1940/08/31, MRN [id number]  PCP:  [name], NP  Cardiologist:  Buford Dresser, MD  Referring MD: [name], NP   CC: Follow up  History of Present Illness:    Tina Calderon is a 80 y.o. female with a hx of pulmonary fibrosis who is seen in follow up for evaluation and management of tachycardia, fatigue, exertional intolerance.  Today: Lost her husband Tina Calderon in November, offered my condolences. She had not been eating well or had much interest in activities, but this is starting to improve. She wants to go to [facility], we discussed having an emergency plan. She is here with a family member today, everyone agrees with the plan.  Asking about activities, etc to try to improve her condition. We discussed several options at length. Also reviewed her medications at length today.   Denies chest pain. No PND, orthopnea, LE edema or unexpected weight gain. No syncope or palpitations.  Past Medical History:  Diagnosis Date  . Anginal pain ([place])   . Cancer ([place])   . CHF (congestive heart failure) ([place]) 01/22/2012  . Chronic fatigue 07/15/2014  . CVA (cerebral vascular accident) ([place]) 04/18/2018  . Family history of pulmonary fibrosis   . GERD (gastroesophageal reflux disease) 05/12/2017  . Hypertension   . Hypothyroidism   . ILD (interstitial lung disease) ([place]) 02/04/2012  . IPF (idiopathic pulmonary fibrosis) ([place]) 2013  . Mild intermittent asthma 01/29/2016  . Pelvic relaxation   . Prolapse of bladder   . Shortness of breath   . Thyroid disease     Past Surgical History:  Procedure Laterality Date  . ANTERIOR AND POSTERIOR REPAIR  12/05/2011   Procedure: ANTERIOR (CYSTOCELE) AND POSTERIOR REPAIR (RECTOCELE);  Surgeon: [name], MD;  Location: [facility];  Service: Gynecology;  Laterality: N/A;  with cysto  . Bladder tact  15 years ago  . COLONOSCOPY WITH PROPOFOL N/A  04/20/2019   Procedure: COLONOSCOPY WITH PROPOFOL;  Surgeon: [name], MD;  Location[facility] ENDOSCOPY;  Service: Gastroenterology;  Laterality: N/A;  . LEFT HEART CATHETERIZATION WITH CORONARY ANGIOGRAM N/A 01/22/2012   Procedure: LEFT HEART CATHETERIZATION WITH CORONARY ANGIOGRAM;  Surgeon: [name], MD;  Location: [facility] CATH LAB;  Service: Cardiovascular;  Laterality: N/A;  . NECK SURGERY  2010  . PSEUDOANERYSM COMPRESSION Right 11/29/2019   Procedure: PSEUDOANERYSM COMPRESSION;  Surgeon: Algernon Huxley, MD;  Location: [place] CV LAB;  Service: Cardiovascular;  Laterality: Right;  . RIGHT HEART CATH N/A 11/19/2019   Procedure: RIGHT HEART CATH;  Surgeon: [name], MD;  Location: [place] [facility] LAB;  Service: Cardiovascular;  Laterality: N/A;  . SHOULDER SURGERY    . thyroid disease    . THYROID LOBECTOMY Left 07/17/2018   Procedure: LEFT THYROID LOBECTOMY;  Surgeon: [name], MD;  Location: [facility];  Service: General;  Laterality: Left;  . TONSILLECTOMY    . VAGINAL HYSTERECTOMY  15 years ago    Current Medications: Current Outpatient Medications on File Prior to Visit  Medication Sig  . acetaminophen (TYLENOL) 500 MG tablet Take 1,000 mg by mouth every 6 (six) hours as needed for mild pain or headache.   [name] amLODipine (NORVASC) 5 MG tablet TAKE 1 TABLET BY MOUTH EVERY DAY FOR BLOOD PRESSURE  . atorvastatin (LIPITOR) 40 MG tablet TAKE 1 TABLET (40 MG TOTAL) BY MOUTH AT BEDTIME. FOR CHOLESTEROL.  [name] CALCIUM  CARBONATE-VITAMIN D PO Take 1 tablet by mouth daily.   . clopidogrel (PLAVIX) 75 MG tablet Take 1 tablet (75 mg total) by mouth daily. For stroke prevention.  . ferrous sulfate 325 (65 FE) MG tablet Take 325 mg by mouth every evening.   [name] levothyroxine (SYNTHROID) 88 MCG tablet TAKE 1 TABLET BY MOUTH EVERY MORNING ON AN EMPTY STOMACH WITH WATER ONLY. NO FOOD OR OTHER MEDICATIONS FOR 30 MINUTES.  [name] loratadine (CLARITIN) 10 MG tablet Take 10 mg by mouth daily.   . metoprolol  succinate (TOPROL-XL) 25 MG 24 hr tablet TAKE 1 TABLET BY MOUTH EVERY DAY  . Multiple Vitamins-Minerals (CENTRUM SILVER PO) Take 1 tablet by mouth daily.  [name] omeprazole (PRILOSEC) 20 MG capsule TAKE 1 CAPSULE BY MOUTH EVERY DAY   No current facility-administered medications on file prior to visit.     Allergies:   Crestor [rosuvastatin calcium] and Dilaudid [hydromorphone]   Social History   Tobacco Use  . Smoking status: Never Smoker  . Smokeless tobacco: Never Used  Vaping Use  . Vaping Use: Never used  Substance Use Topics  . Alcohol use: No  . Drug use: No    Family History: The patient's family history includes Aneurysm in her father; COPD in her son; Coronary artery disease in her mother; Dementia in her maternal grandmother and mother; Heart attack (age of onset: 31) in her maternal grandfather; Hypertension in her mother; Liver disease in her brother; Pulmonary fibrosis in her brother and sister; Sleep apnea in an other family member; Stroke in her maternal grandmother, mother, and another family member; Thyroid disease in her mother.  ROS:   ROS as per HPI, otherwise unremarkable  EKGs/Labs/Other Studies Reviewed:    The following studies were reviewed today: [place] 12/04/19 RA: A-wave 7, V wave 6, mean 2 RV: 62/4 PA: 68/31; mean 46 PW: A-wave 7, V wave 8, mean 6  Oxygen saturation in the AO 100% and in the PA 80% with the patient on 3 L of oxygen.  By the Fick method, cardiac output 6.8 L/min and cardiac index 4.3 L/min/m. By the thermodilution method, cardiac output 5.7 L/min and cardiac index 3.6 L/min/m.  PVR: By the Fick method 5.9 WU and by the thermodilution method 7.1 WU  MPI 04/01/18  Nuclear stress EF: 68%.  Blood pressure demonstrated a normal response to exercise.  There was no ST segment deviation noted during stress.  The study is normal.  This is a low risk study.  The left ventricular ejection fraction is hyperdynamic (>65%).  Echo  04/19/18 - Left ventricle: The cavity size was normal. Wall thickness was   increased in a pattern of mild LVH. Systolic function was   vigorous. The estimated ejection fraction was in the range of 65%   to 70%. Wall motion was normal; there were no regional wall   motion abnormalities. Doppler parameters are consistent with   abnormal left ventricular relaxation (grade 1 diastolic   dysfunction). Doppler parameters are consistent with   indeterminate ventricular filling pressure. - Aortic valve: Mildly calcified annulus. Trileaflet. - Mitral valve: Mildly calcified annulus. - Left atrium: The atrium was mildly dilated. - Atrial septum: No defect or patent foramen ovale was identified. - Tricuspid valve: There was moderate regurgitation. - Pulmonary arteries: PA peak pressure: 63 mm Hg (S). - Pericardium, extracardiac: A trivial pericardial effusion was   identified.  Echo 01/31/18 Study Conclusions  - Left ventricle: The cavity size was normal. Wall thickness was  normal. Systolic function was normal. The estimated ejection   fraction was in the range of 60% to 65%. Wall motion was normal;   there were no regional wall motion abnormalities. Doppler   parameters are consistent with abnormal left ventricular   relaxation (grade 1 diastolic dysfunction). - Aortic valve: Mildly calcified annulus. Normal thickness   leaflets. Valve area (VTI): 1.83 cm^2. Valve area (Vmax): 2.06   cm^2. Valve area (Vmean): 1.74 cm^2. - Mitral valve: Mildly calcified annulus. Normal thickness leaflets   . - Left atrium: The atrium was severely dilated. - Right ventricle: The cavity size was mildly dilated. - Technically adequate study.  Elevated RA pressures, RV-RA gradient 47 mmHg.   Monitor 01/29/18 Ventricular tachycardia-nonsustained-up to 5 beats Atrial tachycardia-37 episodes    Symptoms of fluttering sinus rhythm;                              rare PAC                              Isolated PVC Symptoms of shortness of breath PACs/PVCs  Long term monitor reviewed. All of her patient triggered events are sinus rhythm, occasionally tachycardia, rare isolated ectopy. She had one episode of 5 beats of NSVT without symptoms, and she had one brief episode of 3 beats of SVT. Rest are isolated atrial ectopy and rare PVCs.  CT angio chest 01/12/18 IMPRESSION: 1. No acute findings. No pulmonary embolism seen. No evidence of pneumonia or pulmonary edema. 2. 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 3. Large hiatal hernia, stable. 4. Cardiomegaly. 5. Coronary artery calcifications.  EKG:  EKG personally reviewed today. ECG from 09/08/20 demonstrates NSR, with PACs and artifact at 96 bpm  Recent Labs: 10/15/2019: ALT 27 11/29/2019: BUN 13; Creatinine, Ser 0.70; Potassium 4.4; Sodium 138 02/02/2020: Hemoglobin 13.2; Platelets 161.0 07/12/2020: TSH 1.51  Recent Lipid Panel    Component Value Date/Time   CHOL 119 02/02/2020 0955   TRIG 88.0 02/02/2020 0955   HDL 46.70 02/02/2020 0955   CHOLHDL 3 02/02/2020 0955   VLDL 17.6 02/02/2020 0955   LDLCALC 54 02/02/2020 0955    Physical Exam:    VS:  BP 122/70   Pulse 96   Ht _0  (1.575 m)   Wt 115 lb 3.2 oz (52.3 kg)   BMI 21.07 kg/m     Wt Readings from Last 3 Encounters:  09/08/20 115 lb 3.2 oz (52.3 kg)  03/31/20 123 lb 6.4 oz (56 kg)  03/24/20 117 lb 6.4 oz (53.3 kg)   GEN: frail elderly woman in no acute distress, Volin in place HEENT: Normal, moist mucous membranes NECK: No JVD appreciated CARDIAC: regular rhythm, normal S1 and S2, no rubs or gallops. No murmur. VASCULAR: Radial and DP pulses 2+ bilaterally. No carotid bruits RESPIRATORY:  Fine velcro crackles bilaterally ABDOMEN: Soft, non-tender, non-distended MUSCULOSKELETAL:  Ambulates independently SKIN: Warm and dry, trivial bilateral LE  edema NEUROLOGIC:  Alert and oriented x 3. No focal neuro deficits noted. PSYCHIATRIC:  Normal affect   ASSESSMENT:    1. Chronic respiratory failure with hypoxia (HCC)   2. 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 IPF (idiopathic pulmonary fibrosis) ([place])   4. Essential hypertension    PLAN:    Shortness  of breath, chronic hypoxemic respiratory failure, severe pulmonary hypertension: secondary to her IPF -follows closely with Dr. [name] -had severe diarrhea on prior IPF treatment, stopped -on O2 by nasal cannula chronically -see prior notes re: discussion of advanced directives -she would like to travel. We reviewed nearby hospitals in the area, making sure she has her oxygen, keeping a copy of her medicines and pertinent history with her  palpitations/tachycardia:  -well controlled on metoprolol without worsening her breathing -stable symptoms, though exertion does elevated her heart rate rapidly  Hypertension: -at goal today -continue amlodipine, metoprolol.  Secondary prevention (history of CVA): -recommend heart healthy/Mediterranean diet, with whole grains, fruits, vegetable, fish, lean meats, nuts, and olive oil. Limit salt. -recommend moderate walking, 3-5 times/week for 30-50 minutes each session. Aim for at least 150 minutes.week. Goal should be pace of 3 miles/hours, or walking 1.5 miles in 30 minutes -recommend avoidance of tobacco products. Avoid excess alcohol. -doing well on clopidogrel and atorvastatin  Plan for follow up: 3 mos or sooner PRN  Buford Dresser, MD, PhD [place]  [facility]   Medication Adjustments/Labs and Tests Ordered: Current medicines are reviewed at length with the patient today.  Concerns regarding medicines are outlined above.  Orders Placed This Encounter  Procedures  . EKG 12-Lead   No orders of the defined types were placed in this encounter.   Patient Instructions  Medication  Instructions:  Your Physician recommend you continue on your current medication as directed.    *If you need a refill on your cardiac medications before your next appointment, please call your pharmacy*   Lab Work: None   Testing/Procedures: None   Follow-Up: At [facility], you and your health needs are our priority.  As part of our continuing mission to provide you with exceptional heart care, we have created designated Provider Care Teams.  These Care Teams include your primary Cardiologist (physician) and Advanced Practice Providers (APPs -  Physician Assistants and Nurse Practitioners) who all work together to provide you with the care you need, when you need it.  We recommend signing up for the patient portal called "MyChart".  Sign up information is provided on this After Visit Summary.  MyChart is used to connect with patients for Virtual Visits (Telemedicine).  Patients are able to view lab/test results, encounter notes, upcoming appointments, etc.  Non-urgent messages can be sent to your provider as well.   To learn more about what you can do with MyChart, go to [email or web address].    Your next appointment:   3 month(s) @ [address] [place], [place] [address]   The format for your next appointment:   In Person  Provider:   Buford Dresser, MD    Signed, Buford Dresser, MD PhD 09/08/2020    [place]

## 2020-09-13 ENCOUNTER — Encounter: Payer: Self-pay | Admitting: Cardiology

## 2020-09-22 DIAGNOSIS — I272 Pulmonary hypertension, unspecified: Secondary | ICD-10-CM | POA: Diagnosis not present

## 2020-09-22 DIAGNOSIS — R0602 Shortness of breath: Secondary | ICD-10-CM | POA: Diagnosis not present

## 2020-09-22 DIAGNOSIS — I639 Cerebral infarction, unspecified: Secondary | ICD-10-CM | POA: Diagnosis not present

## 2020-09-22 DIAGNOSIS — J9611 Chronic respiratory failure with hypoxia: Secondary | ICD-10-CM | POA: Diagnosis not present

## 2020-09-22 DIAGNOSIS — I5032 Chronic diastolic (congestive) heart failure: Secondary | ICD-10-CM | POA: Diagnosis not present

## 2020-09-22 DIAGNOSIS — R29898 Other symptoms and signs involving the musculoskeletal system: Secondary | ICD-10-CM | POA: Diagnosis not present

## 2020-10-06 ENCOUNTER — Other Ambulatory Visit: Payer: Self-pay

## 2020-10-06 ENCOUNTER — Ambulatory Visit: Payer: Medicare PPO | Admitting: Internal Medicine

## 2020-10-06 ENCOUNTER — Encounter: Payer: Self-pay | Admitting: Internal Medicine

## 2020-10-06 VITALS — BP 116/70 | HR 89 | Ht 62.0 in | Wt 112.0 lb

## 2020-10-06 DIAGNOSIS — R634 Abnormal weight loss: Secondary | ICD-10-CM

## 2020-10-06 DIAGNOSIS — Z7189 Other specified counseling: Secondary | ICD-10-CM | POA: Diagnosis not present

## 2020-10-06 DIAGNOSIS — J84112 Idiopathic pulmonary fibrosis: Secondary | ICD-10-CM | POA: Diagnosis not present

## 2020-10-06 DIAGNOSIS — J9611 Chronic respiratory failure with hypoxia: Secondary | ICD-10-CM

## 2020-10-06 DIAGNOSIS — F4321 Adjustment disorder with depressed mood: Secondary | ICD-10-CM

## 2020-10-06 NOTE — Progress Notes (Deleted)
80 yo female never smoker seen for initial pulmonary consult during hospital for acute respiratory failure   [facility] follow up 01/30/12 -  Pt was admitted 2 weeks ago for chest pain and dyspnea.  Found to have Acute respiratory failure with hypoxia--  Tx for possible diastolic CHF with aggressive diuresis Echo showed EF 60% , no mention of diastolic dysfxn.  HRCT consistent with ILD-  CCM consult with autoimmune workup that was unrevealing with neg ANA, ANCA, RA factor , nml ESR , CRP    Does light housework but not able walk long distances due to dyspnea  FH of pulmonary fibrosis -sibling -they were smokers. NO significant desats with walking in cardiology yesterday, desated to 93% on room air.  No cough or wheezing  No edema.   Since discharge she is feeling better with less DOE. But does get winded with walking long distances.   ROV 02/21/12 -- follows up for ILD noted during hospitalization for resp failure. Treated for possible diastolic CHF. CT scan shows some mild basilar fibrotic change. PFT's 9/3 >> probable mixed disease, no BD response, restricted volumes, decrease DLCO that corrects for Va.  She had family hx of ILD.  She had walking oximetry that she says was reassuring. She feels no better today than at hospital discharge.   ROV 03/24/12 -- ILD without clear etiology, mixed dz on PFT 02/11/12. Initiated a trial prednisone last month >> She doesn't believe that her dyspnea has changed.  Last time she did not desat.   ROV 06/23/12 -- ILD without clear etiology, mixed dz on PFT 02/11/12. Trial of pred >> weaned to off. Sent her to pulm rehab at [facility], feels that it has benefited her. She isn't as SOB as before. CXR today >> stable bibasilar ILD, hiatal hernia.   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 Also possible component diastolic dysfxn. She has a family hx of ILD.  She tells me that her dyspnea is worse compared with last year. She is having some spells of near syncope on exertion. Walked today to exhaustion without desaturation.   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 She has been part of the pirfenidone trial and has been on the medication. She will be transitioning to the commercial drug. Denies any side effects from the medication. I completed financial assistance forms on 04/12/13.  Due for CXR today. Not really doing exercise - she did do pulmonary rehab at [facility].    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 Last CXR 05/19/13, last CT scan 01/24/12. She is still on the pirfenidone trial, receiving study drug. She is fairly stable. Will be converted to the commercial med soon. Tolerates soon.   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 Returns for f/u. She is now on pirfenidone 3 pills tid and is tolerating. She is having more problems with fatigue and dyspnea with exertion - has lay down after eating dinner, with chores. Her synthroid hasn't been adjusted in a long time.  Her last labs were March 3, 15.   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 She is on pirfenidone 3 pills tid. She has been tolerating well from GI standpoint. TSH normal. She has some fatigue, otherwise  doing well. She needs LFT done in 3 months and after that we can go to every 6 months,.   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 On pirfenidone 3 pills tid since August 2014. Last CT scan 5/'15 was stable, described as an NSIP pattern.  LFT were done today, not yet available. She has noticed some slightly raised pale pink macules, not pruritic, since last month. She has  been careful to wear sunscreen.    ROV 12/01/17 --this follow-up visit for patient with a history of idiopathic interstitial lung disease, some coexisting obstruction based on her spirometry.  She also has a hiatal hernia with GERD and chronic cough.  We have managed her on pirfenidone.  At our last visit she noted that she is had some decreased functional capacity, more dyspnea with exertion. She is still having decreased energy, more fatigue with activity. We checked CBC and TSH, both reassuring. She keeps a slight cough, added chlorpheniramine to loratadine. Remains on omeprazole.    She underwent a 6-minute walk test today and was able to walk 462 m without desaturation.  Her last in 04/2016 was 471mRepeat CT chest from 11/28/2017 reviewed and shows stable interstitial changes compared with her most recent from 2017.  There is patchy groundglass attenuation with associated septal thickening and peripheral bronchial ectasis without overt honeycomb change.  No effusion, no suspicious nodules.   ROV 06/18/18 --Ms. Colina is 89 followed for history of idiopathic interstitial lung disease and some coexisting obstruction noted on spirometry.  She also has a hiatal hernia with GERD and some associated chronic cough.  We have been managing her on pirfenidone - no side effects.  She is on omeprazole, loratadine, chlorpheniramine.  Since our last visit she was hospitalized for a stroke, has some residual word-finding trouble. She is scheduled for nerve conduction studies after some falls and leg weakness. She is also being evaluated for possible thyroid surgery with Dr. GHarlow Asa She had a CT chest done 01/12/18 that I reviewed, shows no real change in NSIP pattern, large HH, no new infiltrates. Overall reports that breathing is stable, not limiting.   ROV 12/03/2018 --pleasant 80year old woman with mixed lung disease.  She has mild obstruction and restriction in the setting of a hiatal hernia and idiopathic  interstitial lung disease that would characterizes IPF.  We have been managing her on pirfenidone.  Also with a history of GERD on Prilosec, allergic rhinitis on loratadine. Last LFT in 05/2018 were normal. Last Ct chest was 01/2018. She is having progressive exertional SOB compared with last time. She has to rest doing housework, taking out the trash. She has to stop to rest. Not on BD's, didn't benefit. She had thyroid sgy in 07/2018, just had her synthroid increased in 07/2018. TSH was 3.05 5/26.     OV 02/25/2019  Subjective:  Patient ID: NRutherford Calderon female , DOB: [birth year], age 80y.o. , MRN: [id number], AD[id number] [id number]  02/25/2019 -   Chief Complaint  Patient presents with  . IPF (idiopathic pulmonary fibrosis)    Had 6 minute walk yesterday.     Clinical IPF patient  - On Esbreit since aug 2014 initially via  expanded access program   - Autoimmune panel 2013 and 2014 is negative and normal   - Pulmonary function test 02/15/2014 postbronchodilator FVC 1.55 L/  59%, total lung capacity 3.05 L/64%, DLCO 14.3/66%  -  CT chest  May 2015 - NSIP per radioolgy  - Trial participagnt early August 07, 2014 for 6 months IPF - Phase 4 study - got esbriet and 6 months of add-on ofev   HPI Sherita Decoste 80 y.o. -has now been referred to the ILD clinic.  This is because she has had progression in her symptoms.  She says for the last few months she has had progressive shortness of breath but no worsening cough or pedal edema or hemoptysis or proximal nocturnal dyspnea.  She is tolerating the Esbriet well since August 2014 when she was in the expanded access program.  Overall she is stable and she is active in the pulmonary fibrosis foundation support group but in the last few months is progressive dyspnea.  Nurse practitioner did a high-resolution CT scan of the chest and it shows progression although the pattern is still indeterminate for UIP.  In addition  6-minute walk test last week she had new onset desaturation and has been started on portable oxygen which is helping.  However she does not have nighttime oxygen.  There is a spirometry that is upcoming.  Review of the spirometry is between 08/07/13 and 08-08-2015 show clear progression in both FVC and DLCO.  Previously serology was negative in 20 13/2014 but she says she might have Raynaud Echocardiogram November 2019 with normal ejection fraction and grade 1 diastolic dysfunction  [facility] Integrated Comprehensive ILD Questionnaire  Symptoms: -ILD diagnosed in 20 14/2015.  Which is gradually worse in the last several months.  She is having level for shortness of breath the household work shopping and walking on level ground at her own pace.  She does not think any other condition is limiting her.  No cough.  No wheezing   Past Medical History : Negative for collagen vascular disease acid reflux but positive for previous stroke in November 2019 and thyroid disease.  Detailed past medical history is negative otherwise   ROS: Positive for fatigue   FAMILY HISTORY of LUNG DISEASE: Her sister died in 68 from pulmonary fibrosis and a brother died in 08/07/2004 from pulmonary fibrosis   EXPOSURE HISTORY: No smoking no cigars.  No vaping no marijuana.  No cocaine.  No intravenous drug use.   HOME and HOBBY DETAILS : Lives in an apartment.  No mold exposure no mildew exposure.  Extensive home exposure history is negative.   OCCUPATIONAL HISTORY (122 questions) : Positive for gardening but otherwise negative.   PULMONARY TOXICITY HISTORY (27 items): Intermittent prednisone burst otherwise negative.     Results for SAIDAH, KEMPTON (MRN [id number]) as of 02/25/2019 11:43  Ref. Range 02/15/2014 11:51 07/15/2014 11:47 01/03/2015 09:40 03/17/2015 13:03 10/23/2015 08:45  FVC-Pre Latest Units: L 1.58 1.59 1.52 1.47 1.44   Results for RIAH, KEHOE (MRN [id number]) as of 02/25/2019 11:43  Ref. Range 02/15/2014  11:51 07/15/2014 11:47 01/03/2015 09:40 03/17/2015 13:03 10/23/2015 08:45  DLCO unc Latest Units: ml/min/mmHg 14.30 13.65 13.66 12.11 12.65  DLCO unc % pred Latest Units: % 66 63 63 56 58    CT chest high resolution Aug 2020 -personally visualized  standard protocol without intravenous contrast. High resolution imaging of the lungs, as well as inspiratory and expiratory imaging, was performed.  COMPARISON:  11/28/2017, 10/09/2015, 01/24/2012  FINDINGS: Cardiovascular: Aortic atherosclerosis. Cardiomegaly. Left coronary artery calcifications. No pericardial effusion.  Mediastinum/Nodes: No enlarged mediastinal, hilar, or axillary lymph nodes. Large hiatal hernia with intrathoracic position of the gastric body and fundus. Thyroid gland, trachea, and esophagus  demonstrate no significant findings.  Lungs/Pleura: Redemonstrated moderate pulmonary fibrosis in a pattern with an apical to basal gradient, featuring peripheral interstitial opacity and some suggestion of nodularity, mild traction bronchiectasis, and occasional areas of subpleural bronchiolectasis in the bases. There is no evidence of honeycombing. Moderate lobular air trapping on expiratory phase imaging. Findings are not significantly changed when compared to immediate prior examination dated 11/28/2017, however are slightly worsened over time on comparison to multiple prior examinations dating back to at least 01/24/2012. No pleural effusion or pneumothorax.  Upper Abdomen: No acute abnormality.  Musculoskeletal: No chest wall mass or suspicious bone lesions identified.  IMPRESSION: 1. Redemonstrated moderate pulmonary fibrosis in a pattern with an apical to basal gradient, featuring peripheral interstitial opacity and some suggestion of nodularity, mild traction bronchiectasis, and occasional areas of subpleural bronchiolectasis in the bases. There is no evidence of honeycombing. Moderate lobular air trapping  on expiratory phase imaging. Findings are not significantly changed when compared to immediate prior examination dated 11/28/2017, however are slightly worsened over time on comparison to multiple prior examinations dating back to at least 01/24/2012. Findings remain indeterminate for UIP and differential considerations include both UIP and chronic fibrosing NSIP.  2.  Coronary artery disease and aortic atherosclerosis.  3.  Hiatal hernia.   Electronically Signed   By: Eddie Candle M.D.   On: 01/15/2019 12:09  ROS - per HPI Recent Labs  Lab 02/23/19 1153  AST 20  ALT 13  ALKPHOS 47  BILITOT 0.3  PROT 6.8  ALBUMIN 4.1     OV 04/08/2019  Subjective:  Patient ID: Tina Calderon, female , DOB: [birth year] , age 64 y.o. , MRN: [id number] , ADDRESS: [id number] [place] [address]   04/08/2019 -   Chief Complaint  Patient presents with  . Follow-up    Patient reports sob with exertion and a mild cough.     Clinical IPF patient    - strong family hx of ILD (brother died age 58, sister died in her 97s)  - Autoimmune panel 2013 and 2014 is negative and normal   - Pulmonary function test 02/15/2014 postbronchodilator FVC 1.55 L/  59%, total lung capacity 3.05 L/64%, DLCO 14.3/66%  - CT chest  May 2015 - NSIP per radioolgy  - Rx  - - On Esbreit since aug 2014 initially via  expanded access program (Trial participagnt early 2016 for 6 months IPF - Phase 4 study - got esbriet and 6 months of add-on ofev)  - Switched to ofev due to progessive disease mid-oct 2020 (03/24/2019) - started on portable o2 sept 2020   HPI Jaydon Soroka [place] 80 y.o. - returns for followup following starting ofev 2 weeks ago. Therapy was changed due to progressive disease. She is tolerating nintedanib fine.  The couple of episodes of mild diarrhea that she had.  One time she had vomiting.  This is following a a family gathering.  Other than that she is tolerating nintedanib just  fine.  In terms of her ILD even though it is progressed compared to last visit in September she feels she is stable.  She is having significant cough.  She says vitamin C helps this.  She is using portable oxygen as needed.  She wants a lighter tank.  She has had overnight oxygen study with the results are still pending this was done a few days ago.  She reminded me about the strong family history of ILD that she has.  Her brother died at age 64 7 years ago her sister died in her 53s also from ILD.  She is interested in seeing the Retail buyer.  I have sent email to genetics counselor Roma Kayser within our health system.    ROS   OV 04/29/2019  Subjective:  Patient ID: Tina Calderon, female , DOB: 1941/05/15 , age 75 y.o. , MRN: [id number] , ADDRESS: [id number] [place] [address]   04/29/2019 -   Chief Complaint  Patient presents with  . Follow-up    Pt states she has been doing well since last visit and denies any complaints.    Follow-up idiopathic pulmonary fibrosis progressive now on nintedanib since mid October 2020. HPI Calia Napp [place] 80 y.o. -returns for follow-up.  She is returning sooner than anticipated.  I saw her 3 weeks ago.  I wanted her back in 4-6 weeks to see how the nintedanib is going.  Due to scheduling issues she comes back in 3 weeks.  She tells me there are no new issues.  She is losing weight.  She did have interim colonoscopy which she says was normal.  She is unsure why she is losing weight.  I explained to her that this could be because of IPF or could be due to previous pirfenidone or current nintedanib.  At this point in time she wants to take the drugs and continue to monitor his situation.  Last liver function test 3 weeks ago was normal.  She is now on 2 L nasal cannula at night since end of October 2020.  She is also using oxygen with exertion.  She wants a light portable system.  She has called [facility].  She wants our  support in getting a lightweight portable oxygen system.     OV 05/28/2019  Subjective:  Patient ID: Tina Calderon, female , DOB: 1940/09/30 , age 1 y.o. , MRN: [id number] , ADDRESS: [id number] [place] [address]   05/28/2019 -   Chief Complaint  Patient presents with  . Follow-up    pt reports of sob with exertion. no new concerns or sx.  on 3L at night and 2L during the day PRN.      Follow-up idiopathic pulmonary fibrosis progressive now on nintedanib since mid October 2020.   HPI Shavonne Ambroise Pedrosa 80 y.o. - 3L night and 2L night prn in day. Normal plse ox Room air at rest. Tolerting ofev well. No diarrhea. No issue swith appetite. No Nausea, vomitting. Lost some weight 122.4# . She lives < 5 miles Dyspnea is stable. She wants portable o2 - but she says [facility] at [facility] office has not called her back. [facility] was supposed to be the provider Though on subjective score below she is worse but she tells me she  Is stable . LFT is normal \   OV 07/16/2019  Subjective:  Patient ID: Tina Calderon, female , DOB: 1940-07-29 , age 80 y.o. , MRN: [id number] , ADDRESS: [id number] [place] [address]   07/16/2019 -   Chief Complaint  Patient presents with  . Follow-up    Pt states she has been feeling good since last OV. SOB is getting worse. Occ. non productive cough. Pt denies any wheezing, fever, or chills   Follow-up idiopathic pulmonary fibrosis progressive now on nintedanib since mid October 2020. Chronic hypoxemic respiratory failure due to the above  HPI Malachy Mood Nafziger 80 y.o. -  presents for follow-up.  She feels that since her last visit her dyspnea slightly worse.  However on pulmonary function test FVC is stable and DLCO is only a little bit worse.  Her walking desaturation test shows continued stability.  She is pleased about it.  However her symptom score is significantly worse.  Because of the isolation she is not exercising.   There is no chest pain no edema.  Her weight itself is stable.  She was initially losing weight and nintedanib but currently it is stable and is documented below.  She is frustrated by the lack of portable oxygen.  She is saying she is using a heavy tank.  Apparently our office called her home care agency called [facility] but she never heard back.  The medical assistant inquired today and it is likely because of the COVID-19 pandemic portable oxygen systems are not presently available      l OV 10/15/2019  Subjective:  Patient ID: Tina Calderon, female , DOB: July 10, 1940 , age 68 y.o. , MRN: [id number] , ADDRESS: [id number] [place] [address]   10/15/2019 -   Chief Complaint  Patient presents with  . Follow-up    Patient reports that she's having to use her oxygen more during the day.     Follow-up idiopathic pulmonary fibrosis progressive now on nintedanib since mid October 2020. Chronic hypoxemic respiratory failure due to the above Hiatal hernia on CT 2020 Normal stress test oct 2019  HPI Anadia Helmes [place] 80 y.o. -presents for her 31-monthfollow-up.  She continues on nintedanib she continues to have some diarrhea.  She feels her shortness of breath is getting worse.  She is using more oxygen in the daytime but in talking to her it seems the same as of February 2021.  Her weight is also the same since February 2021.  Subjective dyspnea score is unchanged compared to February 2021 even though she is feeling worse in her history.  Her walking desaturation test is similar to February 2021 even though she feels worse.  She is supposed have pulmonary function test today but for some reason it was not scheduled perhaps due to the pandemic and disruptions of the PFT lab.  Review of the records indicate that last liver function test was early part of 2021.  Review of records also indicate the 2019 echocardiogram shows pulmonary systolic pressure of 63 mmHg along with grade 1  diastolic dysfunction.  She does not have significant edema.  She is going to turn 79 years in J07-12-24  Her goal is to live past 79.  She is extremely worried that she will might die before J07-05-2020  Both her siblings died early from pulmonary fibrosis.  She is not aware that between her last visit in this visit inhaled treprostinil has been approved for pulmonary hypertension related to pulmonary fibrosis     OV 12/31/2019   Subjective:  Patient ID: NRutherford Calderon female , DOB: 7Sep 16, 1942 age 80y.o. years. , MRN: [id number]  ADDRESS[address] [id number]  CPleas Koch NP Providers : Treatment Team:  Attending Provider: RBrand Males MD   Chief Complaint  Patient presents with  . Follow-up    pt reports of sob with exertion.    Follow-up idiopathic pulmonary fibrosis progressive now on nintedanib since mid October 2020. Chronic hypoxemic respiratory failure due to the above Hiatal hernia on CT 2020 Normal stress test oct 2019 Severe  pulmonary hypertension diagnosed on right heart catheterization June 2021.    HPI Gertrue Willette Caligiuri 80 y.o. -presents for follow-up.  Her weight was between 122 -123 pounds a couple months ago.  Now with 115 pounds.  She believes is because of the pandemic related disruptions.  She also thinks the nintedanib and diarrhea because this weight loss.  She says in the interim on November 19, 2019 she underwent right heart catheterization that showed severe pulmonary hypertension.  And then November 29, 2018 when she ended up with pseudoaneurysm and hematoma.  She was hospitalized here in [place].  After that she was discharged to the nursing home and she just got back a few weeks ago.  She says all this is taken a toll on her weight.  She is now back on the nintedanib for week or 2 or more.  She is tolerating it well.  There is no nausea vomiting.  There is some diarrhea scored 2.  But she says it is actually better.   Nevertheless_she did appears the same.  She says all the recent hospitalization is upset that her emotionally.  Her anxiety and depression scores are slightly higher.  Before she never had it.  She says it is improving.  The medical assistant walked her for routine desaturation.  This time she required 4  liters to correct and even that barely.  We discussed declining functional status and goals of care.  She says she has a living will.  She did not elaborate the content of it.  Advised her DO NOT RESUSCITATE DO NOT INTUBATE she took notified.  She seemed a little upset about the directionality of her disease.  She did ask about life expectancy.  At room air at rest she is normoxic.  Therefore said it could be few months or several months but I was really worried about the loss of weight in the worsening hypoxemia.  She believes the loss of weight is temporally related to recent admission.  She believes she can reset the weight loss by working on it with nutritional changes.  She currently wants to maintain status quo as opposed to calling palliative care.  Also because of the tremendous emotional turmoil with the recent complication of right heart catheterization she is not ready to start inhaled treprostinil.  She does understand that she meets criteria for it. .    November 19, 2019 Elevated right heart pressures with significantly increased transpulmonic gradient with severe pulmonary arterial hypertension with PA pressure 68/31 and mean pressure 46 mmHg. PVR: 5.9 WU (Fick) and 7.1 WU (Thermo) .  Pressures RA: A-wave 7, V wave 6, mean 2 RV: 62/4 PA: 68/31; mean 46 PW: A-wave 7, V wave 8, mean 6      OV 03/24/2020   Subjective:  Patient ID: Tina Calderon, female , DOB: September 06, 1940, age 66 y.o. years. , MRN: [id number],  ADDRESS: [address] [place] [address] PCP  [name], NP Providers : Treatment Team:  Attending Provider: Brand Males, MD Patient Care  Team: [name], NP as PCP - General (Internal Medicine) Buford Dresser, MD as PCP - Cardiology (Cardiology)    Follow-up idiopathic pulmonary fibrosis progressive now on nintedanib since mid October 2020. Chronic hypoxemic respiratory failure due to the above Hiatal hernia on CT 2020 Normal stress test oct 2019 Severe pulmonary hypertension diagnosed on right heart catheterization June 2021.  - declined Tyvaso Aug 2021    Chief Complaint  Patient presents  with  . Follow-up    Doing well on oxygen, on 3-4L at all times. Denies sob, cough or wheezing.       HPI Cyndia Degraff Guido 80 y.o. -presents for follow-up of IPF and chronic hypoxemic respiratory failure.  After I saw her last visit she has discussed with the family and she has adopted formally to be no CPR no intubation but still have full medical care.  In the interim her husband also got admitted.  At baseline he uses a walker but now is oxygen dependent.  And she has to take care of him.  So she is struggling spiritually and emotionally with all this.  She did see nurse practitioner and she declined to do treprostinil for her severe pulmonary hypertension.  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  She is having severe diarrhea because of the nintedanib.  She says she has done holiday of the nintedanib in the past only for the diarrhea to come back.  She is not interested in doing lower dose of nintedanib.  She feels she is at a point where if she passes away she is accepted that but she just wants to live the remaining of her life with high quality and not be miserable with medications giving her side effects.  She also is finding the diarrhea be a hindrance being able to take care of the husband.  She is not interested in clinical trials at this point.  She manages  with 3 L.  Her weight loss is stabilized but she believes that the weight loss is because of the nintedanib.  She still is able to drive    Results for CRYSTEL, DEMARCO (MRN [id number]) as of 04/08/2019 11:19  Ref. Range 07/15/2014 11:47 01/03/2015 09:40 03/17/2015 13:03 10/23/2015 08:45 03/12/2019 16:47 - startr ofev due to progerssiv diseae 07/16/2019   71321  FVC-Pre Latest Units: L 1.59 1.52 1.47 1.44 1.08 1.08    FVC-%Pred-Pre Latest Units: % 61 59 57 56 44   48%  Results for ANEESAH, HERNAN (MRN [id number]) as of 04/08/2019 11:19  Ref. Range 02/15/2014 11:51 07/15/2014 11:47 01/03/2015 09:40 03/17/2015 13:03 10/23/2015 08:45 03/12/2019 16:47 - start ofev 07/16/2019  71321  DLCO unc Latest Units: ml/min/mmHg 14.30 13.65 13.66 12.11 12.65 8.71 8.57 x  DLCO unc % pred Latest Units: % 66 63 63 56 58 49 47% x   ROS - per HPI  ECHO Nov 2019  Study Conclusions   - Left ventricle: The cavity size was normal. Wall thickness was  increased in a pattern of mild LVH. Systolic function was  vigorous. The estimated ejection fraction was in the range of 65%  to 70%. Wall motion was normal; there were no regional wall  motion abnormalities. Doppler parameters are consistent with  abnormal left ventricular relaxation (grade 1 diastolic  dysfunction). Doppler parameters are consistent with  indeterminate ventricular filling pressure.  - Aortic valve: Mildly calcified annulus. Trileaflet.  - Mitral valve: Mildly calcified annulus.  - Left atrium: The atrium was mildly dilated.  - Atrial septum: No defect or patent foramen ovale was identified.  - Tricuspid valve: There was moderate regurgitation.  - Pulmonary arteries: PA peak pressure: 63 mm Hg (S).  - Pericardium, extracardiac: A trivial pericardial effusion was  identified.       OV 10/06/2020  Subjective:  Patient ID: Tina Calderon, female , DOB: [birth year] , age 40 y.o. , MRN: [id number] , ADDRESS: [address] [place]  [place] [address] PCP [name], NP Patient Care Team: [name], NP as PCP - General (Internal Medicine) Buford Dresser, MD as PCP - Cardiology (Cardiology)  This Provider for this visit: Treatment Team:  Attending Provider: Brand Males, MD    10/06/2020 -   Chief Complaint  Patient presents with  . Follow-up    Pt states she has been doing okay since last visit and denies any real concerns.     Follow-up idiopathic pulmonary fibrosis progressive now on nintedanib since mid October 2020. Chronic hypoxemic respiratory failure due to the above Hiatal hernia on CT 2020 Normal stress test oct 2019 Severe pulmonary hypertension diagnosed on right heart catheterization June 2021.  - declined Tyvaso Aug 2021    HPI Jaydy Fitzhenry [place] 80 y.o. -      SYMPTOM SCALE - ILD 04/08/2019 126# 04/29/2019 123# 05/28/2019 122# 07/16/2019 123# 10/15/2019 122.6# 12/31/2019 115# 03/24/2020 116# 10/06/2020 112#  O2 use Portable o2 as needed         Shortness of Breath 0 -> 5 scale with 5 being worst (score 6 If unable to do)    uses 3 L with exertion and 2-1/2 L at night  3 L at night and 2-1/2 with exertion and at rest in the daytime  required 4 L nasal cannula to correct    At rest _0 Simple tasks - showers, clothes change, eating, shaving _1 Household (dishes, doing bed, laundry) _2 3._3 Shopping _4 Walking level at own pace _5 Walking up Stairs _6 Total (40 - 48) Dyspnea Score 19   24._7 How bad is your cough? _8 0   How bad is your fatigue _9 nauea     1 0 0   vomit     0 0 0   diarrhea     2 2 >5   anxiety     0 3 0   depression     0 2 0        Simple office walk 185 feet x  3 laps goal with forehead probe 04/08/2019  04/29/2019  07/16/2019  10/15/2019  12/31/2019  03/24/2020  10/06/2020 a  O2 used RA RA RA bRL office RA brl office ra  ra r  Number laps completed 3   3  Did oly 2 of 3 laps desatured in 3/4th of 1 lap    Comments about pace - stopped at 2 Sopped at end of 2nd lap       Resting Pulse Ox/HR 96% and 104/min 95% and HR 80.min 94% and HR 107/min 96% and HR 80./min 94% RA ->  90% RA at rest 90% RA at rest  Final Pulse Ox/HR 82% and 120/min 85% and 108/min  87% and HR 132.min 86% and HR 115.min 86% with jsue 3/4th of 1 laps ad HR 86    Desaturated </= 88% yes yes yes yes     Desaturated <= 3% points yes yes yes yes  Got Tachycardic >/= 90/min yes yes yes yes     Symptoms at end of test Very dyspneice Moderate dyspnea Moderate dyspnea Very dyspneic     Miscellaneous comments Started ofev mid oct 2020  Recovered quickly Took few minutes to recover Needed 4LNC pulse ox to correct to 85-89%         PFT  PFT Results Latest Ref Rng & Units 12/21/2019 03/12/2019 10/23/2015 03/17/2015 01/03/2015 07/15/2014 02/15/2014  FVC-Pre L - 1.08 1.44 1.47 1.52 1.59 1.58  FVC-Predicted Pre % 48 44 56 57 59 61 60  FVC-Post L - 1.07 1.47 - 1.49 1.58 1.55  FVC-Predicted Post % - 44 57 - 57 60 59  Pre FEV1/FVC % % 89 87 85 84 83 84 84  Post FEV1/FCV % % - 91 86 - 86 85 87  FEV1-Pre L 1.05 0.94 1.22 1.24 1.26 1.33 1.33  FEV1-Predicted Pre % 58 52 64 64 65 68 68  FEV1-Post L - 0.97 1.26 - 1.29 1.35 1.35  DLCO uncorrected ml/min/mmHg - 8.71 12.65 12.11 13.66 13.65 14.30  DLCO UNC% % - 49 58 56 63 63 66  DLCO corrected ml/min/mmHg - - 11.83 - - - -  DLCO COR %Predicted % - - 54 - - - -  DLVA Predicted % - 87 105 109 110 108 104  TLC L - 2.47 - - - 3.01 3.05  TLC % Predicted % - 51 - - - 63 64  RV % Predicted % - 57 - - - 61 60       has a past medical history of Anginal pain (HCC), Cancer ([place]), CHF (congestive heart failure) ([place]) (01/22/2012), Chronic fatigue (07/15/2014), CVA (cerebral vascular accident) ([place]) (04/18/2018), Family history of pulmonary fibrosis, GERD (gastroesophageal reflux disease) (05/12/2017), Hypertension,  Hypothyroidism, ILD (interstitial lung disease) ([place]) (02/04/2012), IPF (idiopathic pulmonary fibrosis) ([place]) (2013), Mild intermittent asthma (01/29/2016), Pelvic relaxation, Prolapse of bladder, Shortness of breath, and Thyroid disease.   reports that she has never smoked. She has never used smokeless tobacco.  Past Surgical History:  Procedure Laterality Date  . ANTERIOR AND POSTERIOR REPAIR  12/05/2011   Procedure: ANTERIOR (CYSTOCELE) AND POSTERIOR REPAIR (RECTOCELE);  Surgeon: [name], MD;  Location: [facility];  Service: Gynecology;  Laterality: N/A;  with cysto  . Bladder tact  15 years ago  . COLONOSCOPY WITH PROPOFOL N/A 04/20/2019   Procedure: COLONOSCOPY WITH PROPOFOL;  Surgeon: [name], MD;  Location: [facility] ENDOSCOPY;  Service: Gastroenterology;  Laterality: N/A;  . LEFT HEART CATHETERIZATION WITH CORONARY ANGIOGRAM N/A 01/22/2012   Procedure: LEFT HEART CATHETERIZATION WITH CORONARY ANGIOGRAM;  Surgeon: [name], MD;  Location[facility] C[facility] LAB;  Service: Cardiovascular;  Laterality: N/A;  . NECK SURGERY  2010  . PSEUDOANERYSM COMPRESSION Right 11/29/2019   Procedure: PSEUDOANERYSM COMPRESSION;  Surgeon: Algernon Huxley, MD;  Location: [facility];  Service: Cardiovascular;  Laterality: Right;  . RIGHT HEART CATH N/A 11/19/2019   Procedure: RIGHT HEART CATH;  Surgeon: [name], MD;  Location: [facility];  Service: Cardiovascular;  Laterality: N/A;  . SHOULDER SURGERY    . thyroid disease    . THYROID LOBECTOMY Left 07/17/2018   Procedure: LEFT THYROID LOBECTOMY;  Surgeon: [name], MD;  Location: [facility];  Service: General;  Laterality: Left;  . TONSILLECTOMY    . VAGINAL HYSTERECTOMY  15 years ago    Allergies  Allergen Reactions  . Crestor [Rosuvastatin Calcium] Other (See Comments)  Myalgias, muscle weakness  . Dilaudid [Hydromorphone] Nausea And Vomiting    Immunization History  Administered Date(s) Administered  . Fluad Quad(high Dose  65+) 03/02/2020  . Influenza Split 02/22/2012, 03/01/2013, 02/08/2014, 01/27/2015  . Influenza Whole 04/11/2011, 02/10/2017  . Influenza, High Dose Seasonal PF 02/16/2017, 01/20/2019  . Influenza,inj,Quad PF,6+ Mos 03/11/2016  . Influenza-Unspecified 01/26/2018  . PFIZER Comirnaty(Gray Top)Covid-19 Tri-Sucrose Vaccine 08/20/2020  . PFIZER(Purple Top)SARS-COV-2 Vaccination 06/30/2019, 07/21/2019  . Pneumococcal Conjugate-13 03/29/2014  . Pneumococcal Polysaccharide-23 12/08/2017  . Pneumococcal-Unspecified 04/11/2011, 05/13/2016  . Td 08/30/2008  . Tdap 11/16/2017  . Zoster 08/07/2011  . Zoster Recombinat (Shingrix) 09/20/2016, 02/19/2017    Family History  Problem Relation Age of Onset  . Thyroid disease Mother   . Hypertension Mother   . Coronary artery disease Mother   . Dementia Mother   . Stroke Mother   . Aneurysm Father   . Pulmonary fibrosis Sister   . Stroke Other   . Sleep apnea Other   . Liver disease Brother   . Pulmonary fibrosis Brother   . Stroke Maternal Grandmother   . Dementia Maternal Grandmother   . Heart attack Maternal Grandfather 67  . COPD Son      Current Outpatient Medications:  .  acetaminophen (TYLENOL) 500 MG tablet, Take 1,000 mg by mouth every 6 (six) hours as needed for mild pain or headache. , Disp: , Rfl:  .  amLODipine (NORVASC) 5 MG tablet, TAKE 1 TABLET BY MOUTH EVERY DAY FOR BLOOD PRESSURE, Disp: 90 tablet, Rfl: 1 .  atorvastatin (LIPITOR) 40 MG tablet, TAKE 1 TABLET (40 MG TOTAL) BY MOUTH AT BEDTIME. FOR CHOLESTEROL., Disp: 90 tablet, Rfl: 3 .  CALCIUM CARBONATE-VITAMIN D PO, Take 1 tablet by mouth daily. , Disp: , Rfl:  .  clopidogrel (PLAVIX) 75 MG tablet, Take 1 tablet (75 mg total) by mouth daily. For stroke prevention., Disp: 90 tablet, Rfl: 3 .  ferrous sulfate 325 (65 FE) MG tablet, Take 325 mg by mouth every evening. , Disp: , Rfl:  .  levothyroxine (SYNTHROID) 88 MCG tablet, TAKE 1 TABLET BY MOUTH EVERY MORNING ON AN EMPTY  STOMACH WITH WATER ONLY. NO FOOD OR OTHER MEDICATIONS FOR 30 MINUTES., Disp: 90 tablet, Rfl: 3 .  loratadine (CLARITIN) 10 MG tablet, Take 10 mg by mouth daily. , Disp: , Rfl:  .  metoprolol succinate (TOPROL-XL) 25 MG 24 hr tablet, TAKE 1 TABLET BY MOUTH EVERY DAY, Disp: 90 tablet, Rfl: 3 .  Multiple Vitamins-Minerals (CENTRUM SILVER PO), Take 1 tablet by mouth daily., Disp: , Rfl:  .  omeprazole (PRILOSEC) 20 MG capsule, TAKE 1 CAPSULE BY MOUTH EVERY DAY, Disp: 90 capsule, Rfl: 3      Objective:   Vitals:   10/06/20 1013  BP: 116/70  Pulse: 89  SpO2: 90%  Weight: 112 lb (50.8 kg)  Height: _0  (1.575 m)    Estimated body mass index is 20.49 kg/m as calculated from the following:   Height as of this encounter: _1  (1.575 m).   Weight as of this encounter: 112 lb (50.8 kg).  _2 @  [employer]   10/06/20 1013  Weight: 112 lb (50.8 kg)     Physical Exam  General Appearance:    Alert, cooperative, no distress, appears stated age - *** , Deconditioned looking - *** , OBESE  - ***, Sitting on Wheelchair -  ***  Head:    Normocephalic, without obvious abnormality, atraumatic  Eyes:    PERRL,  conjunctiva/corneas clear,  Ears:    Normal TM's and external ear canals, both ears  Nose:   Nares normal, septum midline, mucosa normal, no drainage    or sinus tenderness. OXYGEN ON  - *** . Patient is @ ***   Throat:   Lips, mucosa, and tongue normal; teeth and gums normal. Cyanosis on lips - ***  Neck:   Supple, symmetrical, trachea midline, no adenopathy;    thyroid:  no enlargement/tenderness/nodules; no carotid   bruit or JVD  Back:     Symmetric, no curvature, ROM normal, no CVA tenderness  Lungs:     Distress - *** , Wheeze ***, Barrell Chest - ***, Purse lip breathing - ***, Crackles - ***   Chest Wall:    No tenderness or deformity.    Heart:    Regular rate and rhythm, S1 and S2 normal, no rub   or gallop, Murmur - ***  Breast Exam:    NOT DONE  Abdomen:      Soft, non-tender, bowel sounds active all four quadrants,    no masses, no organomegaly. Visceral obesity - ***  Genitalia:   NOT DONE  Rectal:   NOT DONE  Extremities:   Extremities - normal, Has Cane - ***, Clubbing - ***, Edema - ***  Pulses:   2+ and symmetric all extremities  Skin:   Stigmata of Connective Tissue Disease - ***  Lymph nodes:   Cervical, supraclavicular, and axillary nodes normal  Psychiatric:  Neurologic:   Pleasant - ***, Anxious - ***, Flat affect - ***  CAm-ICU - neg, Alert and Oriented x 3 - yes, Moves all 4s - yes, Speech - normal, Cognition - intact    General: No distress. *** Neuro: Alert and Oriented x 3. GCS 15. Speech normal Psych: Pleasant Resp:  Barrel Chest - ***.  Wheeze - ***, Crackles - ***, No overt respiratory distress CVS: Normal heart sounds. Murmurs - *** Ext: Stigmata of Connective Tissue Disease - *** HEENT: Normal upper airway. PEERL +. No post nasal drip        Assessment:     No diagnosis found.     Plan:     There are no Patient Instructions on file for this visit.    SIGNATURE    Dr. Brand Males, M.D., F.C.C.P,  Pulmonary and Critical Care Medicine Staff Physician, [place] Director - Interstitial Lung Disease  Program  Pulmonary [place] [facility] [place], [place], [address]  Pager: [phone], If no answer or between  15:00h - 7:00h: call [phone] Telephone: [phone]  10:49 AM 10/06/2020

## 2020-10-06 NOTE — Progress Notes (Signed)
80 yo female never smoker seen for initial pulmonary consult during hospital for acute respiratory failure   [facility] follow up 01/30/12 -  Pt was admitted 2 weeks ago for chest pain and dyspnea.  Found to have Acute respiratory failure with hypoxia--  Tx for possible diastolic CHF with aggressive diuresis Echo showed EF 60% , no mention of diastolic dysfxn.  HRCT consistent with ILD-  CCM consult with autoimmune workup that was unrevealing with neg ANA, ANCA, RA factor , nml ESR , CRP    Does light housework but not able walk long distances due to dyspnea  FH of pulmonary fibrosis -sibling -they were smokers. NO significant desats with walking in cardiology yesterday, desated to 93% on room air.  No cough or wheezing  No edema.   Since discharge she is feeling better with less DOE. But does get winded with walking long distances.   ROV 02/21/12 -- follows up for ILD noted during hospitalization for resp failure. Treated for possible diastolic CHF. CT scan shows some mild basilar fibrotic change. PFT's 9/3 >> probable mixed disease, no BD response, restricted volumes, decrease DLCO that corrects for Va.  She had family hx of ILD.  She had walking oximetry that she says was reassuring. She feels no better today than at hospital discharge.   ROV 03/24/12 -- ILD without clear etiology, mixed dz on PFT 02/11/12. Initiated a trial prednisone last month >> She doesn't believe that her dyspnea has changed.  Last time she did not desat.   ROV 06/23/12 -- ILD without clear etiology, mixed dz on PFT 02/11/12. Trial of pred >> weaned to off. Sent her to pulm rehab at [facility], feels that it has benefited her. She isn't as SOB as before. CXR today >> stable bibasilar ILD, hiatal hernia.   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 Also possible component diastolic dysfxn. She has a family hx of ILD.  She tells me that her dyspnea is worse compared with last year. She is having some spells of near syncope on exertion. Walked today to exhaustion without desaturation.   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 She has been part of the pirfenidone trial and has been on the medication. She will be transitioning to the commercial drug. Denies any side effects from the medication. I completed financial assistance forms on 04/12/13.  Due for CXR today. Not really doing exercise - she did do pulmonary rehab at [facility].    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 Last CXR 05/19/13, last CT scan 01/24/12. She is still on the pirfenidone trial, receiving study drug. She is fairly stable. Will be converted to the commercial med soon. Tolerates soon.   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 Returns for f/u. She is now on pirfenidone 3 pills tid and is tolerating. She is having more problems with fatigue and dyspnea with exertion - has lay down after eating dinner, with chores. Her synthroid hasn't been adjusted in a long time.  Her last labs were March 3, 15.   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 She is on pirfenidone 3 pills tid. She has been tolerating well from GI standpoint. TSH normal. She has some fatigue, otherwise  doing well. She needs LFT done in 3 months and after that we can go to every 6 months,.   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 On pirfenidone 3 pills tid since August 2014. Last CT scan 5/'15 was stable, described as an NSIP pattern.  LFT were done today, not yet available. She has noticed some slightly raised pale pink macules, not pruritic, since last month. She has  been careful to wear sunscreen.    ROV 12/01/17 --this follow-up visit for patient with a history of idiopathic interstitial lung disease, some coexisting obstruction based on her spirometry.  She also has a hiatal hernia with GERD and chronic cough.  We have managed her on pirfenidone.  At our last visit she noted that she is had some decreased functional capacity, more dyspnea with exertion. She is still having decreased energy, more fatigue with activity. We checked CBC and TSH, both reassuring. She keeps a slight cough, added chlorpheniramine to loratadine. Remains on omeprazole.    She underwent a 6-minute walk test today and was able to walk 462 m without desaturation.  Her last in 04/2016 was 471mRepeat CT chest from 11/28/2017 reviewed and shows stable interstitial changes compared with her most recent from 2017.  There is patchy groundglass attenuation with associated septal thickening and peripheral bronchial ectasis without overt honeycomb change.  No effusion, no suspicious nodules.   ROV 06/18/18 --Tina Calderon is 89 followed for history of idiopathic interstitial lung disease and some coexisting obstruction noted on spirometry.  She also has a hiatal hernia with GERD and some associated chronic cough.  We have been managing her on pirfenidone - no side effects.  She is on omeprazole, loratadine, chlorpheniramine.  Since our last visit she was hospitalized for a stroke, has some residual word-finding trouble. She is scheduled for nerve conduction studies after some falls and leg weakness. She is also being evaluated for possible thyroid surgery with Dr. GHarlow Asa She had a CT chest done 01/12/18 that I reviewed, shows no real change in NSIP pattern, large HH, no new infiltrates. Overall reports that breathing is stable, not limiting.   ROV 12/03/2018 --pleasant 80year old woman with mixed lung disease.  She has mild obstruction and restriction in the setting of a hiatal hernia and idiopathic  interstitial lung disease that would characterizes IPF.  We have been managing her on pirfenidone.  Also with a history of GERD on Prilosec, allergic rhinitis on loratadine. Last LFT in 05/2018 were normal. Last Ct chest was 01/2018. She is having progressive exertional SOB compared with last time. She has to rest doing housework, taking out the trash. She has to stop to rest. Not on BD's, didn't benefit. She had thyroid sgy in 07/2018, just had her synthroid increased in 07/2018. TSH was 3.05 5/26.     OV 02/25/2019  Subjective:  Patient ID: NRutherford Calderon female , DOB: [birth year], age 80y.o. , MRN: [id number], [id number] [id number]  02/25/2019 -   Chief Complaint  Patient presents with  . IPF (idiopathic pulmonary fibrosis)    Had 6 minute walk yesterday.     Clinical IPF patient  - On Esbreit since aug 2014 initially via  expanded access program   - Autoimmune panel 2013 and 2014 is negative and normal   - Pulmonary function test 02/15/2014 postbronchodilator FVC 1.55 L/  59%, total lung capacity 3.05 L/64%, DLCO 14.3/66%  -  CT chest  May 2015 - NSIP per radioolgy  - Trial participagnt early August 07, 2014 for 6 months IPF - Phase 4 study - got esbriet and 6 months of add-on ofev   HPI Tina Calderon 80 y.o. -has now been referred to the ILD clinic.  This is because she has had progression in her symptoms.  She says for the last few months she has had progressive shortness of breath but no worsening cough or pedal edema or hemoptysis or proximal nocturnal dyspnea.  She is tolerating the Esbriet well since August 2014 when she was in the expanded access program.  Overall she is stable and she is active in the pulmonary fibrosis foundation support group but in the last few months is progressive dyspnea.  Nurse practitioner did a high-resolution CT scan of the chest and it shows progression although the pattern is still indeterminate for UIP.  In addition  6-minute walk test last week she had new onset desaturation and has been started on portable oxygen which is helping.  However she does not have nighttime oxygen.  There is a spirometry that is upcoming.  Review of the spirometry is between 08/07/13 and 08-08-2015 show clear progression in both FVC and DLCO.  Previously serology was negative in 20 13/2014 but she says she might have Raynaud Echocardiogram November 2019 with normal ejection fraction and grade 1 diastolic dysfunction  [facility] Integrated Comprehensive ILD Questionnaire  Symptoms: -ILD diagnosed in 20 14/2015.  Which is gradually worse in the last several months.  She is having level for shortness of breath the household work shopping and walking on level ground at her own pace.  She does not think any other condition is limiting her.  No cough.  No wheezing   Past Medical History : Negative for collagen vascular disease acid reflux but positive for previous stroke in November 2019 and thyroid disease.  Detailed past medical history is negative otherwise   ROS: Positive for fatigue   FAMILY HISTORY of LUNG DISEASE: Her sister died in 68 from pulmonary fibrosis and a brother died in 08/07/2004 from pulmonary fibrosis   EXPOSURE HISTORY: No smoking no cigars.  No vaping no marijuana.  No cocaine.  No intravenous drug use.   HOME and HOBBY DETAILS : Lives in an apartment.  No mold exposure no mildew exposure.  Extensive home exposure history is negative.   OCCUPATIONAL HISTORY (122 questions) : Positive for gardening but otherwise negative.   PULMONARY TOXICITY HISTORY (27 items): Intermittent prednisone burst otherwise negative.     Results for SAIDAH, KEMPTON (MRN [id number]) as of 02/25/2019 11:43  Ref. Range 02/15/2014 11:51 07/15/2014 11:47 01/03/2015 09:40 03/17/2015 13:03 10/23/2015 08:45  FVC-Pre Latest Units: L 1.58 1.59 1.52 1.47 1.44   Results for RIAH, KEHOE (MRN [id number]) as of 02/25/2019 11:43  Ref. Range 02/15/2014  11:51 07/15/2014 11:47 01/03/2015 09:40 03/17/2015 13:03 10/23/2015 08:45  DLCO unc Latest Units: ml/min/mmHg 14.30 13.65 13.66 12.11 12.65  DLCO unc % pred Latest Units: % 66 63 63 56 58    CT chest high resolution Aug 2020 -personally visualized  standard protocol without intravenous contrast. High resolution imaging of the lungs, as well as inspiratory and expiratory imaging, was performed.  COMPARISON:  11/28/2017, 10/09/2015, 01/24/2012  FINDINGS: Cardiovascular: Aortic atherosclerosis. Cardiomegaly. Left coronary artery calcifications. No pericardial effusion.  Mediastinum/Nodes: No enlarged mediastinal, hilar, or axillary lymph nodes. Large hiatal hernia with intrathoracic position of the gastric body and fundus. Thyroid gland, trachea, and esophagus  demonstrate no significant findings.  Lungs/Pleura: Redemonstrated moderate pulmonary fibrosis in a pattern with an apical to basal gradient, featuring peripheral interstitial opacity and some suggestion of nodularity, mild traction bronchiectasis, and occasional areas of subpleural bronchiolectasis in the bases. There is no evidence of honeycombing. Moderate lobular air trapping on expiratory phase imaging. Findings are not significantly changed when compared to immediate prior examination dated 11/28/2017, however are slightly worsened over time on comparison to multiple prior examinations dating back to at least 01/24/2012. No pleural effusion or pneumothorax.  Upper Abdomen: No acute abnormality.  Musculoskeletal: No chest wall mass or suspicious bone lesions identified.  IMPRESSION: 1. Redemonstrated moderate pulmonary fibrosis in a pattern with an apical to basal gradient, featuring peripheral interstitial opacity and some suggestion of nodularity, mild traction bronchiectasis, and occasional areas of subpleural bronchiolectasis in the bases. There is no evidence of honeycombing. Moderate lobular air trapping  on expiratory phase imaging. Findings are not significantly changed when compared to immediate prior examination dated 11/28/2017, however are slightly worsened over time on comparison to multiple prior examinations dating back to at least 01/24/2012. Findings remain indeterminate for UIP and differential considerations include both UIP and chronic fibrosing NSIP.  2.  Coronary artery disease and aortic atherosclerosis.  3.  Hiatal hernia.   Electronically Signed   By: Eddie Candle M.D.   On: 01/15/2019 12:09  ROS - per HPI Recent Labs  Lab 02/23/19 1153  AST 20  ALT 13  ALKPHOS 47  BILITOT 0.3  PROT 6.8  ALBUMIN 4.1     OV 04/08/2019  Subjective:  Patient ID: Tina Calderon, female , DOB: 1940-07-29 , age 78 y.o. , MRN: [id number] , ADDRESS: [address] [place] [id number]   04/08/2019 -   Chief Complaint  Patient presents with  . Follow-up    Patient reports sob with exertion and a mild cough.     Clinical IPF patient    - strong family hx of ILD (brother died age 29, sister died in her 40s)  - Autoimmune panel 2013 and 2014 is negative and normal   - Pulmonary function test 02/15/2014 postbronchodilator FVC 1.55 L/  59%, total lung capacity 3.05 L/64%, DLCO 14.3/66%  - CT chest  May 2015 - NSIP per radioolgy  - Rx  - - On Esbreit since aug 2014 initially via  expanded access program (Trial participagnt early 2016 for 6 months IPF - Phase 4 study - got esbriet and 6 months of add-on ofev)  - Switched to ofev due to progessive disease mid-oct 2020 (03/24/2019) - started on portable o2 sept 2020   HPI Tina Calderon 80 y.o. - returns for followup following starting ofev 2 weeks ago. Therapy was changed due to progressive disease. She is tolerating nintedanib fine.  The couple of episodes of mild diarrhea that she had.  One time she had vomiting.  This is following a a family gathering.  Other than that she is tolerating nintedanib just  fine.  In terms of her ILD even though it is progressed compared to last visit in September she feels she is stable.  She is having significant cough.  She says vitamin C helps this.  She is using portable oxygen as needed.  She wants a lighter tank.  She has had overnight oxygen study with the results are still pending this was done a few days ago.  She reminded me about the strong family history of ILD that she has.  Her brother died at age 64 7 years ago her sister died in her 53s also from ILD.  She is interested in seeing the Retail buyer.  I have sent email to genetics counselor Roma Kayser within our health system.    ROS   OV 04/29/2019  Subjective:  Patient ID: Tina Calderon, female , DOB: 1941/05/15 , age 75 y.o. , MRN: [id number] , ADDRESS: [address] [place] [address]   04/29/2019 -   Chief Complaint  Patient presents with  . Follow-up    Pt states she has been doing well since last visit and denies any complaints.    Follow-up idiopathic pulmonary fibrosis progressive now on nintedanib since mid October 2020. HPI Tina Calderon 80 y.o. -returns for follow-up.  She is returning sooner than anticipated.  I saw her 3 weeks ago.  I wanted her back in 4-6 weeks to see how the nintedanib is going.  Due to scheduling issues she comes back in 3 weeks.  She tells me there are no new issues.  She is losing weight.  She did have interim colonoscopy which she says was normal.  She is unsure why she is losing weight.  I explained to her that this could be because of IPF or could be due to previous pirfenidone or current nintedanib.  At this point in time she wants to take the drugs and continue to monitor his situation.  Last liver function test 3 weeks ago was normal.  She is now on 2 L nasal cannula at night since end of October 2020.  She is also using oxygen with exertion.  She wants a light portable system.  She has called [facility].  She wants our  support in getting a lightweight portable oxygen system.     OV 05/28/2019  Subjective:  Patient ID: Tina Calderon, female , DOB: 1940/09/30 , age 1 y.o. , MRN: [id number] , ADDRESS: [address] [place] [address]   05/28/2019 -   Chief Complaint  Patient presents with  . Follow-up    pt reports of sob with exertion. no new concerns or sx.  on 3L at night and 2L during the day PRN.      Follow-up idiopathic pulmonary fibrosis progressive now on nintedanib since mid October 2020.   HPI Shavonne Ambroise Downs 80 y.o. - 3L night and 2L night prn in day. Normal plse ox Room air at rest. Tolerting ofev well. No diarrhea. No issue swith appetite. No Nausea, vomitting. Lost some weight 122.4# . She lives < 5 miles Dyspnea is stable. She wants portable o2 - but she says [facility] at [facility] office has not called her back. [facility] was supposed to be the provider Though on subjective score below she is worse but she tells me she  Is stable . LFT is normal \   OV 07/16/2019  Subjective:  Patient ID: Tina Calderon, female , DOB: 1940-07-29 , age 80 y.o. , MRN: [id number] , ADDRESS: [id number] [place] [address]   07/16/2019 -   Chief Complaint  Patient presents with  . Follow-up    Pt states she has been feeling good since last OV. SOB is getting worse. Occ. non productive cough. Pt denies any wheezing, fever, or chills   Follow-up idiopathic pulmonary fibrosis progressive now on nintedanib since mid October 2020. Chronic hypoxemic respiratory failure due to the above  HPI Tina Calderon 80 y.o. -  presents for follow-up.  She feels that since her last visit her dyspnea slightly worse.  However on pulmonary function test FVC is stable and DLCO is only a little bit worse.  Her walking desaturation test shows continued stability.  She is pleased about it.  However her symptom score is significantly worse.  Because of the isolation she is not exercising.   There is no chest pain no edema.  Her weight itself is stable.  She was initially losing weight and nintedanib but currently it is stable and is documented below.  She is frustrated by the lack of portable oxygen.  She is saying she is using a heavy tank.  Apparently our office called her home care agency called [facility] but she never heard back.  The medical assistant inquired today and it is likely because of the COVID-19 pandemic portable oxygen systems are not presently available      l OV 10/15/2019  Subjective:  Patient ID: Tina Calderon, female , DOB: July 10, 1940 , age 68 y.o. , MRN: [id number] , ADDRESS: [id number] [place] [address]   10/15/2019 -   Chief Complaint  Patient presents with  . Follow-up    Patient reports that she's having to use her oxygen more during the day.     Follow-up idiopathic pulmonary fibrosis progressive now on nintedanib since mid October 2020. Chronic hypoxemic respiratory failure due to the above Hiatal hernia on CT 2020 Normal stress test oct 2019  HPI Tina Calderon 80 y.o. -presents for her 31-monthfollow-up.  She continues on nintedanib she continues to have some diarrhea.  She feels her shortness of breath is getting worse.  She is using more oxygen in the daytime but in talking to her it seems the same as of February 2021.  Her weight is also the same since February 2021.  Subjective dyspnea score is unchanged compared to February 2021 even though she is feeling worse in her history.  Her walking desaturation test is similar to February 2021 even though she feels worse.  She is supposed have pulmonary function test today but for some reason it was not scheduled perhaps due to the pandemic and disruptions of the PFT lab.  Review of the records indicate that last liver function test was early part of 2021.  Review of records also indicate the 2019 echocardiogram shows pulmonary systolic pressure of 63 mmHg along with grade 1  diastolic dysfunction.  She does not have significant edema.  She is going to turn 79 years in J07-12-24  Her goal is to live past 79.  She is extremely worried that she will might die before J07-05-2020  Both her siblings died early from pulmonary fibrosis.  She is not aware that between her last visit in this visit inhaled treprostinil has been approved for pulmonary hypertension related to pulmonary fibrosis     OV 12/31/2019   Subjective:  Patient ID: NRutherford Calderon female , DOB: 7Sep 16, 1942 age 80y.o. years. , MRN: [id number]  ADDRESS[address] [id number]  CPleas Koch NP Providers : Treatment Team:  Attending Provider: RBrand Males MD   Chief Complaint  Patient presents with  . Follow-up    pt reports of sob with exertion.    Follow-up idiopathic pulmonary fibrosis progressive now on nintedanib since mid October 2020. Chronic hypoxemic respiratory failure due to the above Hiatal hernia on CT 2020 Normal stress test oct 2019 Severe  pulmonary hypertension diagnosed on right heart catheterization June 2021.    HPI Tina Calderon 80 y.o. -presents for follow-up.  Her weight was between 122 -123 pounds a couple months ago.  Now with 115 pounds.  She believes is because of the pandemic related disruptions.  She also thinks the nintedanib and diarrhea because this weight loss.  She says in the interim on November 19, 2019 she underwent right heart catheterization that showed severe pulmonary hypertension.  And then November 29, 2018 when she ended up with pseudoaneurysm and hematoma.  She was hospitalized here in [place].  After that she was discharged to the nursing home and she just got back a few weeks ago.  She says all this is taken a toll on her weight.  She is now back on the nintedanib for week or 2 or more.  She is tolerating it well.  There is no nausea vomiting.  There is some diarrhea scored 2.  But she says it is actually better.   Nevertheless_she did appears the same.  She says all the recent hospitalization is upset that her emotionally.  Her anxiety and depression scores are slightly higher.  Before she never had it.  She says it is improving.  The medical assistant walked her for routine desaturation.  This time she required 4  liters to correct and even that barely.  We discussed declining functional status and goals of care.  She says she has a living will.  She did not elaborate the content of it.  Advised her DO NOT RESUSCITATE DO NOT INTUBATE she took notified.  She seemed a little upset about the directionality of her disease.  She did ask about life expectancy.  At room air at rest she is normoxic.  Therefore said it could be few months or several months but I was really worried about the loss of weight in the worsening hypoxemia.  She believes the loss of weight is temporally related to recent admission.  She believes she can reset the weight loss by working on it with nutritional changes.  She currently wants to maintain status quo as opposed to calling palliative care.  Also because of the tremendous emotional turmoil with the recent complication of right heart catheterization she is not ready to start inhaled treprostinil.  She does understand that she meets criteria for it. .    November 19, 2019 Elevated right heart pressures with significantly increased transpulmonic gradient with severe pulmonary arterial hypertension with PA pressure 68/31 and mean pressure 46 mmHg. PVR: 5.9 WU (Fick) and 7.1 WU (Thermo) .  Pressures RA: A-wave 7, V wave 6, mean 2 RV: 62/4 PA: 68/31; mean 46 PW: A-wave 7, V wave 8, mean 6      OV 03/24/2020   Subjective:  Patient ID: Tina Calderon, female , DOB: September 06, 1940, age 66 y.o. years. , MRN: [id number],  ADDRESS: [address] [place] [address] PCP  [name], NP Providers : Treatment Team:  Attending Provider: Brand Males, MD Patient Care  Team: [name], NP as PCP - General (Internal Medicine) Buford Dresser, MD as PCP - Cardiology (Cardiology)    Follow-up idiopathic pulmonary fibrosis progressive now on nintedanib since mid October 2020. Chronic hypoxemic respiratory failure due to the above Hiatal hernia on CT 2020 Normal stress test oct 2019 Severe pulmonary hypertension diagnosed on right heart catheterization June 2021.  - declined Tyvaso Aug 2021    Chief Complaint  Patient presents  with  . Follow-up    Doing well on oxygen, on 3-4L at all times. Denies sob, cough or wheezing.       HPI Tina Calderon 80 y.o. -presents for follow-up of IPF and chronic hypoxemic respiratory failure.  After I saw her last visit she has discussed with the family and she has adopted formally to be no CPR no intubation but still have full medical care.  In the interim her husband also got admitted.  At baseline he uses a walker but now is oxygen dependent.  And she has to take care of him.  So she is struggling spiritually and emotionally with all this.  She did see nurse practitioner and she declined to do treprostinil for her severe pulmonary hypertension.  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  She is having severe diarrhea because of the nintedanib.  She says she has done holiday of the nintedanib in the past only for the diarrhea to come back.  She is not interested in doing lower dose of nintedanib.  She feels she is at a point where if she passes away she is accepted that but she just wants to live the remaining of her life with high quality and not be miserable with medications giving her side effects.  She also is finding the diarrhea be a hindrance being able to take care of the husband.  She is not interested in clinical trials at this point.  She manages  with 3 L.  Her weight loss is stabilized but she believes that the weight loss is because of the nintedanib.  She still is able to drive    Results for CRYSTEL, DEMARCO (MRN [id number]) as of 04/08/2019 11:19  Ref. Range 07/15/2014 11:47 01/03/2015 09:40 03/17/2015 13:03 10/23/2015 08:45 03/12/2019 16:47 - startr ofev due to progerssiv diseae 07/16/2019   71321  FVC-Pre Latest Units: L 1.59 1.52 1.47 1.44 1.08 1.08    FVC-%Pred-Pre Latest Units: % 61 59 57 56 44   48%  Results for ANEESAH, HERNAN (MRN [id number]) as of 04/08/2019 11:19  Ref. Range 02/15/2014 11:51 07/15/2014 11:47 01/03/2015 09:40 03/17/2015 13:03 10/23/2015 08:45 03/12/2019 16:47 - start ofev 07/16/2019  71321  DLCO unc Latest Units: ml/min/mmHg 14.30 13.65 13.66 12.11 12.65 8.71 8.57 x  DLCO unc % pred Latest Units: % 66 63 63 56 58 49 47% x   ROS - per HPI  ECHO Nov 2019  Study Conclusions   - Left ventricle: The cavity size was normal. Wall thickness was  increased in a pattern of mild LVH. Systolic function was  vigorous. The estimated ejection fraction was in the range of 65%  to 70%. Wall motion was normal; there were no regional wall  motion abnormalities. Doppler parameters are consistent with  abnormal left ventricular relaxation (grade 1 diastolic  dysfunction). Doppler parameters are consistent with  indeterminate ventricular filling pressure.  - Aortic valve: Mildly calcified annulus. Trileaflet.  - Mitral valve: Mildly calcified annulus.  - Left atrium: The atrium was mildly dilated.  - Atrial septum: No defect or patent foramen ovale was identified.  - Tricuspid valve: There was moderate regurgitation.  - Pulmonary arteries: PA peak pressure: 63 mm Hg (S).  - Pericardium, extracardiac: A trivial pericardial effusion was  identified.       OV 10/06/2020  Subjective:  Patient ID: Tina Calderon, female , DOB: July 05, 1940 , age 25 y.o. , MRN: [id number] , ADDRESS: [address] [place]  [place] [address] PCP [name], NP Patient Care Team: [name], NP as PCP - General (Internal Medicine) Buford Dresser, MD as PCP - Cardiology (Cardiology)  This Provider for this visit: Treatment Team:  Attending Provider: Brand Males, MD    Oct 09, 2020 -   Chief Complaint  Patient presents with  . Follow-up    Pt states she has been doing okay since last visit and denies any real concerns.     Follow-up idiopathic pulmonary fibrosis progressive   - previously on esbriet 2012/07/18 -> oct 2020 and stopped due to progressive disese   - now on nintedanib since mid October 2020. Chronic hypoxemic respiratory failure due to the above  - 3-4L [place] as of Aprl 07-18-2020 Hiatal hernia on CT Jul 18, 2018 Normal stress test oct 2019 Severe pulmonary hypertension diagnosed on right heart catheterization June 2021.  - declined Tyvaso Aug 2021    HPI Tina Calderon 80 y.o. -returns for follow-up.  She she will soon be 80 years old.  In the fall 07-19-19 after a visit to see me in [facility] her husband passed away.  She is still grieving but has recovered.  She has lost 2 siblings from pulmonary fibrosis.  She has 1 surviving sibling who is with her.  The sibling is 1 year younger than her and is quite active but does not have pulmonary fibrosis.  Patient initially says she is doing stable and well on 3-4 L nasal cannula but as the interview went on it is apparent that she has difficulty doing a lot of activities of daily living although she is able to still self-care and cook.  The sister lives 10 minutes away and is able to offer support but does not keep an eye on the patient at all times.  Patient again expressed interest in just being on supportive care without any antifibrotic's.  Previously she declined treprostinil for pulmonary hypertension.  She continues to 3-4 L of nasal cannula oxygen.  She is continue to lose some weight but in the house she is able to move  around.  She is asking about using facemask oxygen for better relief.  She complains of dry mouth and dry nose.  Talked her about saline spray and using Biotene.  She is interested though in therapeutic options I do not get the GI side effects that nintedanib give.  Currently the only available and trial options.  Her severe pulmonary hypertension may be a disqualify her.      SYMPTOM SCALE - ILD 04/08/2019 126# 04/29/2019 123# 05/28/2019 122# 19-Jul-2019 123# 10/15/2019 122.6# 12/31/2019 115# 03/24/2020 116# Oct 09, 2020 112# - husband  Died late 2019/07/19  O2 use Portable o2 as needed         Shortness of Breath 0 -> 5 scale with 5 being worst (score 6 If unable to do)    uses 3 L with exertion and 2-1/2 L at night  3 L at night and 2-1/2 with exertion and at rest in the daytime  required 4 L nasal cannula to correct  On 3-4LNC  At rest _0 Simple tasks - showers, clothes change, eating, shaving _1 Household (dishes, doing bed, laundry) _2 3._3 Shopping 1 3  3  _0 Walking level at own pace _1 Walking up Stairs _2 Total (40 - 48) Dyspnea Score 19   24._3 How bad is your cough? _4 0 1  How bad is your fatigue _5 nauea     1 0 0 0  vomit     0 0 0 0  diarrhea     2 2 >5 0  anxiety     0 3 0 1  depression     0 2 0 0       Simple office walk 185 feet x  3 laps goal with forehead probe 04/08/2019  04/29/2019  07/16/2019  10/15/2019  12/31/2019  03/24/2020   O2 used RA RA RA bRL office RA brl office ra ra  Number laps completed 3   3  Did oly 2 of 3 laps desatured in 3/4th of 1 lap   Comments about pace - stopped at 2 Sopped at end of 2nd lap      Resting Pulse Ox/HR 96% and 104/min 95% and HR 80.min 94% and HR 107/min 96% and HR 80./min 94% RA ->  90% RA at rest  Final Pulse Ox/HR 82% and 120/min 85% and 108/min  87% and HR 132.min 86% and HR 115.min 86% with jsue 3/4th of 1 laps ad HR 86    Desaturated </= 88% yes yes yes yes    Desaturated <= 3% points yes yes yes yes    Got Tachycardic >/= 90/min yes yes yes yes    Symptoms at end of test Very dyspneice Moderate dyspnea Moderate dyspnea Very dyspneic    Miscellaneous comments Started ofev mid oct 2020  Recovered quickly Took few minutes to recover Needed 4LNC pulse ox to correct to 85-89%        PFT  PFT Results Latest Ref Rng & Units 12/21/2019 03/12/2019 10/23/2015 03/17/2015 01/03/2015 07/15/2014 02/15/2014  FVC-Pre L - 1.08 1.44 1.47 1.52 1.59 1.58  FVC-Predicted Pre % 48 44 56 57 59 61 60  FVC-Post L - 1.07 1.47 - 1.49 1.58 1.55  FVC-Predicted Post % - 44 57 - 57 60 59  Pre FEV1/FVC % % 89 87 85 84 83 84 84  Post FEV1/FCV % % - 91 86 - 86 85 87  FEV1-Pre L 1.05 0.94 1.22 1.24 1.26 1.33 1.33  FEV1-Predicted Pre % 58 52 64 64 65 68 68  FEV1-Post L - 0.97 1.26 - 1.29 1.35 1.35  DLCO uncorrected ml/min/mmHg - 8.71 12.65 12.11 13.66 13.65 14.30  DLCO UNC% % - 49 58 56 63 63 66  DLCO corrected ml/min/mmHg - - 11.83 - - - -  DLCO COR %Predicted % - - 54 - - - -  DLVA Predicted % - 87 105 109 110 108 104  TLC L - 2.47 - - - 3.01 3.05  TLC % Predicted % - 51 - - - 63 64  RV % Predicted % - 57 - - - 61 60       has a past medical history of Anginal pain (HCC), Cancer ([place]), CHF (congestive heart failure) ([place]) (01/22/2012), Chronic fatigue (07/15/2014), CVA (cerebral vascular accident) ([place]) (04/18/2018), Family history of pulmonary fibrosis, GERD (gastroesophageal reflux disease) (05/12/2017), Hypertension, Hypothyroidism, ILD (interstitial lung disease) ([place]) (02/04/2012), IPF (idiopathic pulmonary  fibrosis) ([place]) (2013), Mild intermittent asthma (01/29/2016), Pelvic relaxation, Prolapse of bladder, Shortness of breath, and Thyroid disease.   reports that she has never smoked. She has never used smokeless tobacco.  Past Surgical History:  Procedure Laterality Date  . ANTERIOR AND POSTERIOR REPAIR  12/05/2011   Procedure:  ANTERIOR (CYSTOCELE) AND POSTERIOR REPAIR (RECTOCELE);  Surgeon: [name], MD;  Location: [facility];  Service: Gynecology;  Laterality: N/A;  with cysto  . Bladder tact  15 years ago  . COLONOSCOPY WITH PROPOFOL N/A 04/20/2019   Procedure: COLONOSCOPY WITH PROPOFOL;  Surgeon: [name], MD;  Location: [facility] ENDOSCOPY;  Service: Gastroenterology;  Laterality: N/A;  . LEFT HEART CATHETERIZATION WITH CORONARY ANGIOGRAM N/A 01/22/2012   Procedure: LEFT HEART CATHETERIZATION WITH CORONARY ANGIOGRAM;  Surgeon: [name], MD;  Location: [facility] CATH LAB;  Service: Cardiovascular;  Laterality: N/A;  . NECK SURGERY  2010  . PSEUDOANERYSM COMPRESSION Right 11/29/2019   Procedure: PSEUDOANERYSM COMPRESSION;  Surgeon: Algernon Huxley, MD;  Location: [facility];  Service: Cardiovascular;  Laterality: Right;  . RIGHT HEART CATH N/A 11/19/2019   Procedure: RIGHT HEART CATH;  Surgeon: [name], MD;  Location: [facility];  Service: Cardiovascular;  Laterality: N/A;  . SHOULDER SURGERY    . thyroid disease    . THYROID LOBECTOMY Left 07/17/2018   Procedure: LEFT THYROID LOBECTOMY;  Surgeon: [name], MD;  Location: [facility];  Service: General;  Laterality: Left;  . TONSILLECTOMY    . VAGINAL HYSTERECTOMY  15 years ago    Allergies  Allergen Reactions  . Crestor [Rosuvastatin Calcium] Other (See Comments)    Myalgias, muscle weakness  . Dilaudid [Hydromorphone] Nausea And Vomiting  . Ofev [Nintedanib] Diarrhea    Immunization History  Administered Date(s) Administered  . Fluad Quad(high Dose 65+) 03/02/2020  . Influenza Split 02/22/2012, 03/01/2013, 02/08/2014, 01/27/2015  . Influenza Whole 04/11/2011, 02/10/2017  . Influenza, High Dose Seasonal PF 02/16/2017, 01/20/2019  . Influenza,inj,Quad PF,6+ Mos 03/11/2016  . Influenza-Unspecified 01/26/2018  . PFIZER Comirnaty(Gray Top)Covid-19 Tri-Sucrose Vaccine 08/20/2020  . PFIZER(Purple Top)SARS-COV-2 Vaccination 06/30/2019,  07/21/2019  . Pneumococcal Conjugate-13 03/29/2014  . Pneumococcal Polysaccharide-23 12/08/2017  . Pneumococcal-Unspecified 04/11/2011, 05/13/2016  . Td 08/30/2008  . Tdap 11/16/2017  . Zoster 08/07/2011  . Zoster Recombinat (Shingrix) 09/20/2016, 02/19/2017    Family History  Problem Relation Age of Onset  . Thyroid disease Mother   . Hypertension Mother   . Coronary artery disease Mother   . Dementia Mother   . Stroke Mother   . Aneurysm Father   . Pulmonary fibrosis Sister   . Stroke Other   . Sleep apnea Other   . Liver disease Brother   . Pulmonary fibrosis Brother   . Stroke Maternal Grandmother   . Dementia Maternal Grandmother   . Heart attack Maternal Grandfather 67  . COPD Son      Current Outpatient Medications:  .  acetaminophen (TYLENOL) 500 MG tablet, Take 1,000 mg by mouth every 6 (six) hours as needed for mild pain or headache. , Disp: , Rfl:  .  amLODipine (NORVASC) 5 MG tablet, TAKE 1 TABLET BY MOUTH EVERY DAY FOR BLOOD PRESSURE, Disp: 90 tablet, Rfl: 1 .  atorvastatin (LIPITOR) 40 MG tablet, TAKE 1 TABLET (40 MG TOTAL) BY MOUTH AT BEDTIME. FOR CHOLESTEROL., Disp: 90 tablet, Rfl: 3 .  CALCIUM CARBONATE-VITAMIN D PO, Take 1 tablet by mouth daily. , Disp: , Rfl:  .  clopidogrel (PLAVIX) 75 MG  tablet, Take 1 tablet (75 mg total) by mouth daily. For stroke prevention., Disp: 90 tablet, Rfl: 3 .  ferrous sulfate 325 (65 FE) MG tablet, Take 325 mg by mouth every evening. , Disp: , Rfl:  .  levothyroxine (SYNTHROID) 88 MCG tablet, TAKE 1 TABLET BY MOUTH EVERY MORNING ON AN EMPTY STOMACH WITH WATER ONLY. NO FOOD OR OTHER MEDICATIONS FOR 30 MINUTES., Disp: 90 tablet, Rfl: 3 .  loratadine (CLARITIN) 10 MG tablet, Take 10 mg by mouth daily. , Disp: , Rfl:  .  metoprolol succinate (TOPROL-XL) 25 MG 24 hr tablet, TAKE 1 TABLET BY MOUTH EVERY DAY, Disp: 90 tablet, Rfl: 3 .  Multiple Vitamins-Minerals (CENTRUM SILVER PO), Take 1 tablet by mouth daily., Disp: , Rfl:  .   omeprazole (PRILOSEC) 20 MG capsule, TAKE 1 CAPSULE BY MOUTH EVERY DAY, Disp: 90 capsule, Rfl: 3      Objective:   Vitals:   10/06/20 1013  BP: 116/70  Pulse: 89  SpO2: 90%  Weight: 112 lb (50.8 kg)  Height: 5' 2" (1.575 m)    Estimated body mass index is 20.49 kg/m as calculated from the following:   Height as of this encounter: 5' 2" (1.575 m).   Weight as of this encounter: 112 lb (50.8 kg).  _0 @  Filed Weights   10/06/20 1013  Weight: 112 lb (50.8 kg)     Physical Exam General: No distress.  Frail female sitting in the wheelchair.  Not much of edema Neuro: Alert and Oriented x 3. GCS 15. Speech normal Psych: Pleasant Resp:  Barrel Chest - no.  Wheeze - no, Crackles -bilateral lower lobe crackles present, No overt respiratory distress CVS: Normal heart sounds. Murmurs - no Ext: Stigmata of Connective Tissue Disease - no HEENT: Normal upper airway. PEERL +. No post nasal drip        Assessment:       ICD-10-CM   1. IPF (idiopathic pulmonary fibrosis) ([place])  J84.112 Ambulatory Referral for DME    Amb Referral to Palliative Care    Pulmonary function test  2. Chronic respiratory failure with hypoxia (HCC)  J96.11 Ambulatory Referral for DME    Amb Referral to Palliative Care  3. 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 Goals of care, counseling/discussion  Z71.89   5. Weight loss, non-intentional  R63.4   6. 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[place])  - progressive disease but stable since last visit - off ofev due to severe diarrehea in oct 2021 (previously on esbriet 2014 -> oct 2020 and stopped due to progressive disese) - on 3-4L [place]  - sorry for the loss of your husband  Plan  - list ofev as allergy - continue o2 for pulse ox goal > 88% - can evaluae you in future for  trials such as TETON, PROMEDIOR, and ZEPHYRUS - do spirometry and dlco in 8-12 weeks  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  Plan - respect deferral on tyvaso last time  - can reasess in future    Goals of care, counseling/discussion Grief reaction   - Sorry for your loss of husband - support DNR, DNI but stil full medical care including trials - discussed palliative care versus hospice - support no hospice   Plan  - refer home palliative care (not hospice)  for symptom relief - try face mask o2 (needs DME script) for dyspnea relef  Weight loss  -Probably due to advanced lung disease  Plan  - monitor  Followup  - 8-12  weeks - 30 min visit in [place]  - ILD symptom score at followup   ( Level 05 visit: Estb 40-54 min in  visit type: on-site physical face to visit  in total care time and counseling or/and coordination of care by this undersigned MD - Dr Brand Males. This includes one or more of the following on this same day 10/06/2020: pre-charting, chart review, note writing, documentation discussion of test results, diagnostic or treatment recommendations, prognosis, risks and benefits of management options, instructions, education, compliance or risk-factor reduction. It excludes time spent by the [facility] or office staff in the care of the patient. Actual time 24 min)     SIGNATURE    Dr. Brand Males, M.D., F.C.C.P,  Pulmonary and Critical Care Medicine Staff Physician, [place] Director - Interstitial Lung Disease  Program  Pulmonary [facility] [place], [place], [address]  Pager: [phone], If no answer or between  15:00h - 7:00h: call [phone] Telephone: [phone]  4:34 PM 10/06/2020

## 2020-10-06 NOTE — Patient Instructions (Addendum)
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

## 2020-10-10 ENCOUNTER — Telehealth: Payer: Self-pay | Admitting: Adult Health Nurse Practitioner

## 2020-10-10 NOTE — Telephone Encounter (Signed)
Spoke with patient regarding the Palliative referral/services and she was aware of referral, all questions were answered and she was in agreement with scheduling a visit.  I have scheduled an In-home Consult for 10/23/20 @ 3 PM.

## 2020-10-20 ENCOUNTER — Telehealth: Payer: Self-pay | Admitting: Adult Health Nurse Practitioner

## 2020-10-20 NOTE — Telephone Encounter (Signed)
Spoke with patient and confirmed appointment for Monday. Tina Calderon K. Olena Heckle NP

## 2020-10-22 DIAGNOSIS — R29898 Other symptoms and signs involving the musculoskeletal system: Secondary | ICD-10-CM | POA: Diagnosis not present

## 2020-10-22 DIAGNOSIS — J9611 Chronic respiratory failure with hypoxia: Secondary | ICD-10-CM | POA: Diagnosis not present

## 2020-10-22 DIAGNOSIS — I272 Pulmonary hypertension, unspecified: Secondary | ICD-10-CM | POA: Diagnosis not present

## 2020-10-22 DIAGNOSIS — I5032 Chronic diastolic (congestive) heart failure: Secondary | ICD-10-CM | POA: Diagnosis not present

## 2020-10-22 DIAGNOSIS — R0602 Shortness of breath: Secondary | ICD-10-CM | POA: Diagnosis not present

## 2020-10-22 DIAGNOSIS — I639 Cerebral infarction, unspecified: Secondary | ICD-10-CM | POA: Diagnosis not present

## 2020-10-23 ENCOUNTER — Encounter: Payer: Self-pay | Admitting: Adult Health Nurse Practitioner

## 2020-10-23 ENCOUNTER — Other Ambulatory Visit: Payer: Medicare PPO | Admitting: Adult Health Nurse Practitioner

## 2020-10-23 ENCOUNTER — Other Ambulatory Visit: Payer: Self-pay

## 2020-10-23 VITALS — BP 122/84 | HR 83

## 2020-10-23 DIAGNOSIS — Z515 Encounter for palliative care: Secondary | ICD-10-CM | POA: Diagnosis not present

## 2020-10-23 DIAGNOSIS — J8410 Pulmonary fibrosis, unspecified: Secondary | ICD-10-CM | POA: Diagnosis not present

## 2020-10-23 DIAGNOSIS — I2723 Pulmonary hypertension due to lung diseases and hypoxia: Secondary | ICD-10-CM

## 2020-10-23 DIAGNOSIS — R040 Epistaxis: Secondary | ICD-10-CM

## 2020-10-23 DIAGNOSIS — J849 Interstitial pulmonary disease, unspecified: Secondary | ICD-10-CM | POA: Diagnosis not present

## 2020-10-23 NOTE — Progress Notes (Signed)
[facility] Palliative Care Consult Note Telephone: [phone]  Fax: [phone]    Date of encounter: 10/23/20 PATIENT NAME: Tina Calderon [address] [place] [address]   [id number] (home)  DOB: [birth year] MRN: [id number] PRIMARY CARE PROVIDER:    [name], NP,  [address] [place] [address] [phone]  REFERRING PROVIDER:   Dr. [name]  RESPONSIBLE PARTY:    Contact Information    Name Relation Home Work Mobile   [name] Daughter   [phone]   [facility] Sister   [phone]       I met face to face with patient and family in home. Palliative Care was asked to follow this patient by consultation request of  Dr. [name] to address advance care planning and complex medical decision making. This is the initial visit.   Sister, son, daughter, and granddaughter present during visit.                                  ASSESSMENT AND PLAN / RECOMMENDATIONS:   Advance Care Planning/Goals of Care: Goals include to maximize quality of life and symptom management. Our advance care planning conversation included a discussion about:   Filled out DNR and uploaded to [facility]. Left original with patient. Left MOST for her and family to go over. Spent at least 20 minutes discussing ACP  The value and importance of advance care planning   Experiences with loved ones who have been seriously ill or have died   She is in the process of updating her [place] states that her son and daughter are he HCPOAs.    Decision not to resuscitate or to de-escalate disease focused treatments due to poor prognosis.  CODE STATUS: DNR  Symptom Management/Plan:  Pulmonary fibrosis:  Has tried Esbriet and taken off due to continued disease progression.  Tried Ofev with significant GI side effects and taken off.  Not presently on any active treatment.  She is open to trials.  Discussed ways to conserve her energy  and using a portable fan and cool washcloth when having difficulty breathing.  Continue follow up and recommendations by pulmonology  Pulmonary HTN:  Patient offered Tyvaso and she did not want to try this due to side effects.  Continue follow up and recommendations by pulmonology and cardiology  Nose bleeds:  This may be due to multiple factors causing her nasal passages to be dry leading to nose bleeds which is worsened by her Plavix.  She states she is going to reach out to Dr. [name] about this and encouraged her to do so. Did encourage her to go to ER if another nose bleed occurs that she is unable to stop.  Will forward note to Dr. [name] and PCP.   Follow up Palliative Care Visit: Palliative care will continue to follow for complex medical decision making, advance care planning, and clarification of goals. Return 8 weeks or prn. Encouraged to call with any questions or concerns  This visit was coded based on medical decision making (MDM).  PPS: 50%  HOSPICE ELIGIBILITY/DIAGNOSIS: TBD  Chief Complaint: initial palliative visit  HISTORY OF PRESENT ILLNESS:  Tina Calderon is a 80 y.o. year old female  with pulmonary fibrosis, pulmonary hypertension, chronic hypoxemic respiratory failure, CHF, anginal pain, GERD, HTN, hypothyroidism, h/o CVA and MI. Patient has tried esbriet for her PF and taken off  due to CT of chest showing continued progression. Ofev caused her to have diarrhea and nausea.  She is open to trials.  Does state that she was told that her age may disqualify her from participating. She is having increased DOE over the past 3-4 months.  She gets SOB with walking short distances and states that eating takes her while due to SOB.  She is conversing today without SOB.  She uses O2 via nasal canula at 4-5L.  Does increase it with activity.  She gets anxiety when she can't "catch her breath."  She calms herself with deep breathing exercises.  She ambulates with rollator with  seat and has to stop to take breaks.  Has to use wheelchair when going out of her apartment for appointments.  She finds it easier to take a bath instead of a shower.  She is independent of ADLs but does take frequent rest breaks.  Family helps her with cleaning, cooking, and shopping.  She is able to manage her own medications. Patient states that she has had 3 nose bleeds over the past week.  The first 2 were minor and stopped quickly.  She had a major nose bleed last night lasting several hours and she did have stringy clots with it as well. She is not having any increased SOB, weakness, tiredness, palpitations today.  Her appetite has improved and she states her weight has been stable around 113 pounds.  Rest of 10 point ROS asked and negative unless stated in HPI  History obtained from review of EMR and interview with family and Tina Calderon.  I reviewed available labs, medications, imaging, studies and related documents from the EMR.  Records reviewed and summarized above.  PFT Results Latest Ref Rng & Units 12/21/2019 03/12/2019 10/23/2015 03/17/2015 01/03/2015 07/15/2014 02/15/2014  FVC-Pre L - 1.08 1.44 1.47 1.52 1.59 1.58  FVC-Predicted Pre % 48 44 56 57 59 61 60  FVC-Post L - 1.07 1.47 - 1.49 1.58 1.55  FVC-Predicted Post % - 44 57 - 57 60 59  Pre FEV1/FVC % % 89 87 85 84 83 84 84  Post FEV1/FCV % % - 91 86 - 86 85 87  FEV1-Pre L 1.05 0.94 1.22 1.24 1.26 1.33 1.33  FEV1-Predicted Pre % 58 52 64 64 65 68 68  FEV1-Post L - 0.97 1.26 - 1.29 1.35 1.35  DLCO uncorrected ml/min/mmHg - 8.71 12.65 12.11 13.66 13.65 14.30  DLCO UNC% % - 49 58 56 63 63 66  DLCO corrected ml/min/mmHg - - 11.83 - - - -  DLCO COR %Predicted % - - 54 - - - -  DLVA Predicted % - 87 105 109 110 108 104  TLC L - 2.47 - - - 3.01 3.05  TLC % Predicted % - 51 - - - 63 64  RV % Predicted % - 57 - - - 61 60      Physical Exam:  Constitutional: NAD General: frail appearing, thin  EYES: anicteric sclera, lids intact, no  discharge  ENMT: intact hearing, oral mucous membranes moist CV: S1S2, RRR, trace edema to ankles  Pulmonary: LCTA, no increased work of breathing, no cough Abdomen:  normo-active BS + 4 quadrants, soft and non tender MSK:  moves all extremities, ambulatory with walker Skin: warm and dry, no rashes or wounds on visible skin Neuro:  A and O x 3 Hem/lymph/immuno: no widespread bruising   CURRENT PROBLEM LIST:  Patient Active Problem List   Diagnosis Date Noted  .  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 Medication management 01/28/2020  . Goals of care, counseling/discussion 01/28/2020  . Pseudoaneurysm following procedure ([place]) 12/03/2019  . Rt groin pain   . Pseudoaneurysm ([place]) 11/28/2019  . Pulmonary arterial hypertension ([place])   . Genetic testing 07/06/2019  . Heart palpitations 05/18/2019  . Shortness of breath 05/18/2019  . Family history of pulmonary fibrosis   . Therapeutic drug monitoring 03/22/2019  . Aortic atherosclerosis ([place]) 03/04/2019  . Chronic respiratory failure with hypoxia ([place]) 02/23/2019  . Pulmonary HTN ([place]) 02/23/2019  . Vertigo 10/12/2018  . Multiple thyroid nodules 07/11/2018  . 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 History of CVA (cerebrovascular accident) 04/18/2018  . Myopathy 02/04/2018  . Gait abnormality   . Chronic diastolic congestive heart failure ([place])   . Acute blood loss anemia   . Anemia of chronic disease   . Lower extremity weakness 01/31/2018  . Thyroid nodule 01/31/2018  . Tachycardia 01/19/2018  . Prediabetes 12/18/2017  . Preventative health care 12/18/2017  . GERD (gastroesophageal reflux disease) 05/12/2017  . Essential hypertension 09/25/2016  . Cough variant asthma 06/13/2016  . Allergic rhinitis 01/29/2016  . Mild intermittent asthma 01/29/2016  . Cough 01/03/2015  . Chronic fatigue 07/15/2014  . Research study patient 06/28/2014  . IPF (idiopathic pulmonary  fibrosis) / IPAF 02/04/2012  . Orthostatic hypotension 01/24/2012  . Normocytic anemia 01/22/2012  . Dyslipidemia, goal LDL below 70 01/22/2012  . Anemia 01/20/2012  . Hypothyroidism   . Pelvic relaxation 11/11/2011   PAST MEDICAL HISTORY:  Active Ambulatory Problems    Diagnosis Date Noted  . Pelvic relaxation 11/11/2011  . Hypothyroidism   . Anemia 01/20/2012  . Normocytic anemia 01/22/2012  . Dyslipidemia, goal LDL below 70 01/22/2012  . Orthostatic hypotension 01/24/2012  . IPF (idiopathic pulmonary fibrosis) / IPAF 02/04/2012  . Research study patient 06/28/2014  . Chronic fatigue 07/15/2014  . Cough 01/03/2015  . Allergic rhinitis 01/29/2016  . Mild intermittent asthma 01/29/2016  . Cough variant asthma 06/13/2016  . Essential hypertension 09/25/2016  . GERD (gastroesophageal reflux disease) 05/12/2017  . Prediabetes 12/18/2017  . Preventative health care 12/18/2017  . Tachycardia 01/19/2018  . Lower extremity weakness 01/31/2018  . Thyroid nodule 01/31/2018  . Gait abnormality   . Chronic diastolic congestive heart failure ([place])   . Acute blood loss anemia   . Anemia of chronic disease   . History of CVA (cerebrovascular accident) 04/18/2018  . Multiple thyroid nodules 07/11/2018  . 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 Vertigo 10/12/2018  . Chronic respiratory failure with hypoxia ([place]) 02/23/2019  . Pulmonary HTN ([place]) 02/23/2019  . Aortic atherosclerosis ([place]) 03/04/2019  . Therapeutic drug monitoring 03/22/2019  . Family history of pulmonary fibrosis   . Heart palpitations 05/18/2019  . Shortness of breath 05/18/2019  . Genetic testing 07/06/2019  . Pulmonary arterial hypertension ([place])   . Pseudoaneurysm ([place]) 11/28/2019  . Rt groin pain   . Myopathy 02/04/2018  . Pseudoaneurysm following procedure ([place]) 12/03/2019  . Medication management 01/28/2020  . Goals of care, counseling/discussion 01/28/2020  . Pulmonary hypertension  due to interstitial lung disease ([place]) 04/07/2020   Resolved Ambulatory Problems    Diagnosis Date Noted  . Acute maxillary sinusitis 12/13/2009  . Acute ethmoidal sinusitis 12/13/2009  . H/O bladder repair surgery   . Chest pain syndrome 01/20/2012  . Family history of cardiovascular disease 01/20/2012  . Interstitial edema seen  on chest CT 01/20/12 01/20/2012  . Pulmonary edema cardiac cause ([place]) 01/20/2012  . Respiratory failure with hypoxia ([place]) 01/21/2012  . CHF (congestive heart failure) ([place]) 01/22/2012  . Acute on chronic diastolic CHF (congestive heart failure), NYHA class 3 ([place]) 01/23/2012  . Acute respiratory failure ([place]) 01/24/2012  . Pulmonary edema 01/24/2012  . Pulmonary infiltrate 01/24/2012  . Acute URI 09/02/2013  . Research subject 08/03/2014  . Hypothyroidism 01/31/2018  . Stuttering   . History of CVA (cerebrovascular accident) 05/18/2019   Past Medical History:  Diagnosis Date  . Anginal pain ([place])   . Cancer ([place])   . CVA (cerebral vascular accident) ([place]) 04/18/2018  . Hypertension   . ILD (interstitial lung disease) ([employer]) 02/04/2012  . Prolapse of bladder   . Thyroid disease    SOCIAL HX:  Social History   Tobacco Use  . Smoking status: Never Smoker  . Smokeless tobacco: Never Used  Substance Use Topics  . Alcohol use: No   FAMILY HX:  Family History  Problem Relation Age of Onset  . Thyroid disease Mother   . Hypertension Mother   . Coronary artery disease Mother   . Dementia Mother   . Stroke Mother   . Aneurysm Father   . Pulmonary fibrosis Sister   . Stroke Other   . Sleep apnea Other   . Liver disease Brother   . Pulmonary fibrosis Brother   . Stroke Maternal Grandmother   . Dementia Maternal Grandmother   . Heart attack Maternal Grandfather 67  . COPD Son       ALLERGIES:  Allergies  Allergen Reactions  . Crestor [Rosuvastatin Calcium] Other (See Comments)    Myalgias, muscle weakness  . Dilaudid [Hydromorphone] Nausea  And Vomiting  . Ofev [Nintedanib] Diarrhea     PERTINENT MEDICATIONS:  Outpatient Encounter Medications as of 10/23/2020  Medication Sig  . acetaminophen (TYLENOL) 500 MG tablet Take 1,000 mg by mouth every 6 (six) hours as needed for mild pain or headache.   [name] amLODipine (NORVASC) 5 MG tablet TAKE 1 TABLET BY MOUTH EVERY DAY FOR BLOOD PRESSURE  . atorvastatin (LIPITOR) 40 MG tablet TAKE 1 TABLET (40 MG TOTAL) BY MOUTH AT BEDTIME. FOR CHOLESTEROL.  [name] CALCIUM CARBONATE-VITAMIN D PO Take 1 tablet by mouth daily.   . clopidogrel (PLAVIX) 75 MG tablet Take 1 tablet (75 mg total) by mouth daily. For stroke prevention.  . ferrous sulfate 325 (65 FE) MG tablet Take 325 mg by mouth every evening.   [name] levothyroxine (SYNTHROID) 88 MCG tablet TAKE 1 TABLET BY MOUTH EVERY MORNING ON AN EMPTY STOMACH WITH WATER ONLY. NO FOOD OR OTHER MEDICATIONS FOR 30 MINUTES.  [name] loratadine (CLARITIN) 10 MG tablet Take 10 mg by mouth daily.   . metoprolol succinate (TOPROL-XL) 25 MG 24 hr tablet TAKE 1 TABLET BY MOUTH EVERY DAY  . Multiple Vitamins-Minerals (CENTRUM SILVER PO) Take 1 tablet by mouth daily.  [name] omeprazole (PRILOSEC) 20 MG capsule TAKE 1 CAPSULE BY MOUTH EVERY DAY   No facility-administered encounter medications on file as of 10/23/2020.    Thank you for the opportunity to participate in the care of Tina Calderon.  The palliative care team will continue to follow. Please call our office at [phone] if we can be of additional assistance.   Paitynn Mikus Jenetta Downer, NP , DNP  This chart was dictated using voice recognition software. Despite best efforts to proofread, errors can occur which can change the documentation meaning.  COVID-19 PATIENT SCREENING TOOL Asked and negative response unless otherwise noted:   Have you had symptoms of covid, tested positive or been in contact with someone with symptoms/positive test in the past 5-10 days? negative

## 2020-10-25 ENCOUNTER — Telehealth: Payer: Self-pay | Admitting: Internal Medicine

## 2020-10-25 NOTE — Telephone Encounter (Signed)
The [employer] Society has history of new guidelines.  I listened to the new guideline but notes that the conference a few days ago.  Prior to that was acid reflux medication was important in IPF progression.  However is rethinking on this going on.  If acid reflux is not active she can definitely consider stopping the PPI  She does have hiatal hernia which is a concern that she might have active acid reflux.  What she can do stop the PPI and change to over-the-counter H2 blockade [anything that available to pharmacy] and also sleep with the head end of the bed elevated

## 2020-10-25 NOTE — Telephone Encounter (Signed)
Called and spoke to pt. Pt states when she picked up her Prilosec from the pharmacy she was told by pharmacist that the Prilosec could interfere with the Plavix she is taking. Pt states she has never had GERD but was prescribed Prilosec when she was diagnosed with IPF in 2013. Pt is questioning if she should still take the Prilosec with the Plavix or if she should discontinue it due to the potential interaction with plavix.   Per [employer]: Major severity; "Concurrent use of CLOPIDOGREL and OMEPRAZOLE may result in reduced plasma concentrations of clopidogrel active metabolite and reduced antiplatelet activity."  Dr. [name], please advise. Thanks.

## 2020-10-25 NOTE — Telephone Encounter (Signed)
Spoke with the pt and notified of response per MR  She verbalized understanding  She is not having active symptoms  Nothing further needed

## 2020-10-30 ENCOUNTER — Telehealth: Payer: Self-pay | Admitting: Adult Health Nurse Practitioner

## 2020-10-30 NOTE — Telephone Encounter (Signed)
Returned patient's VM.  She had another nose bleed that was hard to stop this morning and called EMS.  Stated it had started slowing down and stopped when EMS were there.  Did not go to ER.  Was suggested she use a humidifier and she started using one today.  She is going to keep a journal of her nosebleeds and anything she may have done that could cause them.  Encouraged to call with any questions or concerns. Tina Calderon K. Olena Heckle NP

## 2020-11-22 DIAGNOSIS — I272 Pulmonary hypertension, unspecified: Secondary | ICD-10-CM | POA: Diagnosis not present

## 2020-11-22 DIAGNOSIS — I639 Cerebral infarction, unspecified: Secondary | ICD-10-CM | POA: Diagnosis not present

## 2020-11-22 DIAGNOSIS — I5032 Chronic diastolic (congestive) heart failure: Secondary | ICD-10-CM | POA: Diagnosis not present

## 2020-11-22 DIAGNOSIS — R29898 Other symptoms and signs involving the musculoskeletal system: Secondary | ICD-10-CM | POA: Diagnosis not present

## 2020-11-22 DIAGNOSIS — J9611 Chronic respiratory failure with hypoxia: Secondary | ICD-10-CM | POA: Diagnosis not present

## 2020-11-22 DIAGNOSIS — R0602 Shortness of breath: Secondary | ICD-10-CM | POA: Diagnosis not present

## 2020-11-27 ENCOUNTER — Other Ambulatory Visit
Admission: RE | Admit: 2020-11-27 | Discharge: 2020-11-27 | Disposition: A | Discharge status: Home / Self Care | Payer: Medicare PPO | Source: Ambulatory Visit | Attending: Internal Medicine | Admitting: Internal Medicine

## 2020-11-27 ENCOUNTER — Other Ambulatory Visit: Payer: Self-pay

## 2020-11-27 DIAGNOSIS — Z20822 Contact with and (suspected) exposure to covid-19: Secondary | ICD-10-CM | POA: Diagnosis not present

## 2020-11-27 DIAGNOSIS — Z01812 Encounter for preprocedural laboratory examination: Secondary | ICD-10-CM | POA: Diagnosis not present

## 2020-11-27 LAB — SARS CORONAVIRUS 2 (TAT 6-24 HRS): SARS Coronavirus 2: NEGATIVE

## 2020-11-30 ENCOUNTER — Ambulatory Visit (INDEPENDENT_AMBULATORY_CARE_PROVIDER_SITE_OTHER): Payer: Medicare PPO | Admitting: Internal Medicine

## 2020-11-30 ENCOUNTER — Telehealth: Payer: Self-pay | Admitting: Internal Medicine

## 2020-11-30 ENCOUNTER — Other Ambulatory Visit: Payer: Self-pay

## 2020-11-30 ENCOUNTER — Encounter: Payer: Self-pay | Admitting: Internal Medicine

## 2020-11-30 ENCOUNTER — Ambulatory Visit: Payer: Medicare PPO | Admitting: Internal Medicine

## 2020-11-30 VITALS — BP 118/68 | HR 59 | Ht 62.5 in | Wt 109.0 lb

## 2020-11-30 DIAGNOSIS — Z7189 Other specified counseling: Secondary | ICD-10-CM | POA: Diagnosis not present

## 2020-11-30 DIAGNOSIS — J84112 Idiopathic pulmonary fibrosis: Secondary | ICD-10-CM

## 2020-11-30 DIAGNOSIS — Z66 Do not resuscitate: Secondary | ICD-10-CM

## 2020-11-30 DIAGNOSIS — Z789 Other specified health status: Secondary | ICD-10-CM

## 2020-11-30 DIAGNOSIS — R600 Localized edema: Secondary | ICD-10-CM | POA: Diagnosis not present

## 2020-11-30 DIAGNOSIS — I272 Pulmonary hypertension, unspecified: Secondary | ICD-10-CM

## 2020-11-30 DIAGNOSIS — J9611 Chronic respiratory failure with hypoxia: Secondary | ICD-10-CM

## 2020-11-30 DIAGNOSIS — R634 Abnormal weight loss: Secondary | ICD-10-CM

## 2020-11-30 LAB — PULMONARY FUNCTION TEST
DL/VA % pred: 87 %
DL/VA: 3.59 ml/min/mmHg/L
DLCO cor % pred: 45 %
DLCO cor: 8.16 ml/min/mmHg
DLCO unc % pred: 45 %
DLCO unc: 8.16 ml/min/mmHg
FEF 25-75 Pre: 1.2 L/sec
FEF2575-%Pred-Pre: 90 %
FEV1-%Pred-Pre: 48 %
FEV1-Pre: 0.87 L
FEV1FVC-%Pred-Pre: 127 %
FEV6-%Pred-Pre: 40 %
FEV6-Pre: 0.93 L
FEV6FVC-%Pred-Pre: 106 %
FVC-%Pred-Pre: 38 %
FVC-Pre: 0.93 L
Pre FEV1/FVC ratio: 94 %
Pre FEV6/FVC Ratio: 100 %

## 2020-11-30 MED ORDER — POTASSIUM CHLORIDE CRYS ER 10 MEQ PO TBCR: 10.0000 meq | EXTENDED_RELEASE_TABLET | Freq: Every day | ORAL | 5 refills | Status: AC

## 2020-11-30 MED ORDER — FUROSEMIDE 20 MG PO TABS: 20.0000 mg | ORAL_TABLET | Freq: Every day | ORAL | 5 refills | Status: AC

## 2020-11-30 NOTE — Progress Notes (Signed)
Spirometry/DLCO performed today.

## 2020-11-30 NOTE — Progress Notes (Signed)
80 yo female never smoker seen for initial pulmonary consult during hospital for acute respiratory failure   [facility] follow up 01/30/12 -  Pt was admitted 2 weeks ago for chest pain and dyspnea.  Found to have Acute respiratory failure with hypoxia--  Tx for possible diastolic CHF with aggressive diuresis Echo showed EF 60% , no mention of diastolic dysfxn.  HRCT consistent with ILD-  CCM consult with autoimmune workup that was unrevealing with neg ANA, ANCA, RA factor , nml ESR , CRP    Does light housework but not able walk long distances due to dyspnea  FH of pulmonary fibrosis -sibling -they were smokers. NO significant desats with walking in cardiology yesterday, desated to 93% on room air.  No cough or wheezing  No edema.   Since discharge she is feeling better with less DOE. But does get winded with walking long distances.   ROV 02/21/12 -- follows up for ILD noted during hospitalization for resp failure. Treated for possible diastolic CHF. CT scan shows some mild basilar fibrotic change. PFT's 9/3 >> probable mixed disease, no BD response, restricted volumes, decrease DLCO that corrects for Va.  She had family hx of ILD.  She had walking oximetry that she says was reassuring. She feels no better today than at hospital discharge.   ROV 03/24/12 -- ILD without clear etiology, mixed dz on PFT 02/11/12. Initiated a trial prednisone last month >> She doesn't believe that her dyspnea has changed.  Last time she did not desat.   ROV 06/23/12 -- ILD without clear etiology, mixed dz on PFT 02/11/12. Trial of pred >> weaned to off. Sent her to pulm rehab at [facility], feels that it has benefited her. She isn't as SOB as before. CXR today >> stable bibasilar ILD, hiatal hernia.   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 Also possible component diastolic dysfxn. She has a family hx of ILD.  She tells me that her dyspnea is worse compared with last year. She is having some spells of near syncope on exertion. Walked today to exhaustion without desaturation.   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 She has been part of the pirfenidone trial and has been on the medication. She will be transitioning to the commercial drug. Denies any side effects from the medication. I completed financial assistance forms on 04/12/13.  Due for CXR today. Not really doing exercise - she did do pulmonary rehab at [facility].    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 Last CXR 05/19/13, last CT scan 01/24/12. She is still on the pirfenidone trial, receiving study drug. She is fairly stable. Will be converted to the commercial med soon. Tolerates soon.   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 Returns for f/u. She is now on pirfenidone 3 pills tid and is tolerating. She is having more problems with fatigue and dyspnea with exertion - has lay down after eating dinner, with chores. Her synthroid hasn't been adjusted in a long time.  Her last labs were March 3, 15.   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 She is on pirfenidone 3 pills tid. She has been tolerating well from GI standpoint. TSH normal. She has some fatigue, otherwise  doing well. She needs LFT done in 3 months and after that we can go to every 6 months,.   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 On pirfenidone 3 pills tid since August 2014. Last CT scan 5/'15 was stable, described as an NSIP pattern.  LFT were done today, not yet available. She has noticed some slightly raised pale pink macules, not pruritic, since last month. She has  been careful to wear sunscreen.    ROV 12/01/17 --this follow-up visit for patient with a history of idiopathic interstitial lung disease, some coexisting obstruction based on her spirometry.  She also has a hiatal hernia with GERD and chronic cough.  We have managed her on pirfenidone.  At our last visit she noted that she is had some decreased functional capacity, more dyspnea with exertion. She is still having decreased energy, more fatigue with activity. We checked CBC and TSH, both reassuring. She keeps a slight cough, added chlorpheniramine to loratadine. Remains on omeprazole.    She underwent a 6-minute walk test today and was able to walk 462 m without desaturation.  Her last in 04/2016 was 431mRepeat CT chest from 11/28/2017 reviewed and shows stable interstitial changes compared with her most recent from 2017.  There is patchy groundglass attenuation with associated septal thickening and peripheral bronchial ectasis without overt honeycomb change.  No effusion, no suspicious nodules.   ROV 06/18/18 --Ms. Deeg is 80 followed for history of idiopathic interstitial lung disease and some coexisting obstruction noted on spirometry.  She also has a hiatal hernia with GERD and some associated chronic cough.  We have been managing her on pirfenidone - no side effects.  She is on omeprazole, loratadine, chlorpheniramine.  Since our last visit she was hospitalized for a stroke, has some residual word-finding trouble. She is scheduled for nerve conduction studies after some falls and leg weakness. She is also being evaluated for possible thyroid surgery with Dr. GHarlow Asa She had a CT chest done 01/12/18 that I reviewed, shows no real change in NSIP pattern, large HH, no new infiltrates. Overall reports that breathing is stable, not limiting.   ROV 12/03/2018 --pleasant 80year old woman with mixed lung disease.  She has mild obstruction and restriction in the setting of a hiatal hernia and idiopathic  interstitial lung disease that would characterizes IPF.  We have been managing her on pirfenidone.  Also with a history of GERD on Prilosec, allergic rhinitis on loratadine. Last LFT in 05/2018 were normal. Last Ct chest was 01/2018. She is having progressive exertional SOB compared with last time. She has to rest doing housework, taking out the trash. She has to stop to rest. Not on BD's, didn't benefit. She had thyroid sgy in 07/2018, just had her synthroid increased in 07/2018. TSH was 3.05 5/26.     OV 02/25/2019  Subjective:  Patient ID: NRutherford Limerick female , DOB: [birth year], age 80y.o. , MRN: [id number], AD[id number] [id number]  02/25/2019 -   Chief Complaint  Patient presents with   IPF (idiopathic pulmonary fibrosis)    Had 6 minute walk yesterday.     Clinical IPF patient  - On Esbreit since aug 2014 initially via  expanded access program   - Autoimmune panel 2013 and 2014 is negative and normal   - Pulmonary function test 02/15/2014 postbronchodilator FVC 1.55 L/  59%, total lung capacity 3.05 L/64%, DLCO 14.3/66%  -  CT chest  May 2015 - NSIP per radioolgy  - Trial participagnt early Aug 10, 2014 for 6 months IPF - Phase 4 study - got esbriet and 6 months of add-on ofev   HPI Tris Howell 80 y.o. -has now been referred to the ILD clinic.  This is because she has had progression in her symptoms.  She says for the last few months she has had progressive shortness of breath but no worsening cough or pedal edema or hemoptysis or proximal nocturnal dyspnea.  She is tolerating the Esbriet well since August 2014 when she was in the expanded access program.  Overall she is stable and she is active in the pulmonary fibrosis foundation support group but in the last few months is progressive dyspnea.  Nurse practitioner did a high-resolution CT scan of the chest and it shows progression although the pattern is still indeterminate for UIP.  In addition  6-minute walk test last week she had new onset desaturation and has been started on portable oxygen which is helping.  However she does not have nighttime oxygen.  There is a spirometry that is upcoming.  Review of the spirometry is between 10-Aug-2013 and August 11, 2015 show clear progression in both FVC and DLCO.  Previously serology was negative in 20 13/2014 but she says she might have Raynaud Echocardiogram November 2019 with normal ejection fraction and grade 1 diastolic dysfunction  [place] Integrated Comprehensive ILD Questionnaire  Symptoms: -ILD diagnosed in 20 14/2015.  Which is gradually worse in the last several months.  She is having level for shortness of breath the household work shopping and walking on level ground at her own pace.  She does not think any other condition is limiting her.  No cough.  No wheezing   Past Medical History : Negative for collagen vascular disease acid reflux but positive for previous stroke in November 2019 and thyroid disease.  Detailed past medical history is negative otherwise   ROS: Positive for fatigue   FAMILY HISTORY of LUNG DISEASE: Her sister died in 61 from pulmonary fibrosis and a brother died in 10-Aug-2004 from pulmonary fibrosis   EXPOSURE HISTORY: No smoking no cigars.  No vaping no marijuana.  No cocaine.  No intravenous drug use.   HOME and HOBBY DETAILS : Lives in an apartment.  No mold exposure no mildew exposure.  Extensive home exposure history is negative.   OCCUPATIONAL HISTORY (122 questions) : Positive for gardening but otherwise negative.   PULMONARY TOXICITY HISTORY (27 items): Intermittent prednisone burst otherwise negative.     Results for EMYLEE, DECELLE (MRN [id number]) as of 02/25/2019 11:43  Ref. Range 02/15/2014 11:51 07/15/2014 11:47 01/03/2015 09:40 03/17/2015 13:03 10/23/2015 08:45  FVC-Pre Latest Units: L 1.58 1.59 1.52 1.47 1.44   Results for YAMILKA, LOPICCOLO (MRN [id number]) as of 02/25/2019 11:43  Ref. Range 02/15/2014  11:51 07/15/2014 11:47 01/03/2015 09:40 03/17/2015 13:03 10/23/2015 08:45  DLCO unc Latest Units: ml/min/mmHg 14.30 13.65 13.66 12.11 12.65  DLCO unc % pred Latest Units: % 66 63 63 56 58    CT chest high resolution Aug 2020 -personally visualized  standard protocol without intravenous contrast. High resolution imaging of the lungs, as well as inspiratory and expiratory imaging, was performed.   COMPARISON:  11/28/2017, 10/09/2015, 01/24/2012   FINDINGS: Cardiovascular: Aortic atherosclerosis. Cardiomegaly. Left coronary artery calcifications. No pericardial effusion.   Mediastinum/Nodes: No enlarged mediastinal, hilar, or axillary lymph nodes. Large hiatal hernia with intrathoracic position of the gastric body and fundus. Thyroid gland,  trachea, and esophagus demonstrate no significant findings.   Lungs/Pleura: Redemonstrated moderate pulmonary fibrosis in a pattern with an apical to basal gradient, featuring peripheral interstitial opacity and some suggestion of nodularity, mild traction bronchiectasis, and occasional areas of subpleural bronchiolectasis in the bases. There is no evidence of honeycombing. Moderate lobular air trapping on expiratory phase imaging. Findings are not significantly changed when compared to immediate prior examination dated 11/28/2017, however are slightly worsened over time on comparison to multiple prior examinations dating back to at least 01/24/2012. No pleural effusion or pneumothorax.   Upper Abdomen: No acute abnormality.   Musculoskeletal: No chest wall mass or suspicious bone lesions identified.   IMPRESSION: 1. Redemonstrated moderate pulmonary fibrosis in a pattern with an apical to basal gradient, featuring peripheral interstitial opacity and some suggestion of nodularity, mild traction bronchiectasis, and occasional areas of subpleural bronchiolectasis in the bases. There is no evidence of honeycombing. Moderate lobular air trapping  on expiratory phase imaging. Findings are not significantly changed when compared to immediate prior examination dated 11/28/2017, however are slightly worsened over time on comparison to multiple prior examinations dating back to at least 01/24/2012. Findings remain indeterminate for UIP and differential considerations include both UIP and chronic fibrosing NSIP.   2.  Coronary artery disease and aortic atherosclerosis.   3.  Hiatal hernia.     Electronically Signed   By: Eddie Candle M.D.   On: 01/15/2019 12:09  ROS - per HPI Recent Labs  Lab 02/23/19 1153  AST 20  ALT 13  ALKPHOS 47  BILITOT 0.3  PROT 6.8  ALBUMIN 4.1     OV 04/08/2019  Subjective:  Patient ID: [name], female , DOB: [birth year] , age 57 y.o. , MRN: [id number] , ADDRESS: [id number] [place] [address]   04/08/2019 -   Chief Complaint  Patient presents with   Follow-up    Patient reports sob with exertion and a mild cough.     Clinical IPF patient    - strong family hx of ILD (brother died age 26, sister died in her 3s)  - Autoimmune panel 2013 and 2014 is negative and normal   - Pulmonary function test 02/15/2014 postbronchodilator FVC 1.55 L/  59%, total lung capacity 3.05 L/64%, DLCO 14.3/66%  - CT chest  May 2015 - NSIP per radioolgy  - Rx  - - On Esbreit since aug 2014 initially via  expanded access program (Trial participagnt early 2016 for 6 months IPF - Phase 4 study - got esbriet and 6 months of add-on ofev)  - Switched to ofev due to progessive disease mid-oct 2020 (03/24/2019) - started on portable o2 sept 2020   HPI Nahjae Hoeg Bond 80 y.o. - returns for followup following starting ofev 2 weeks ago. Therapy was changed due to progressive disease. She is tolerating nintedanib fine.  The couple of episodes of mild diarrhea that she had.  One time she had vomiting.  This is following a a family gathering.  Other than that she is tolerating nintedanib just  fine.  In terms of her ILD even though it is progressed compared to last visit in September she feels she is stable.  She is having significant cough.  She says vitamin C helps this.  She is using portable oxygen as needed.  She wants a lighter tank.  She has had overnight oxygen study with the results are still pending this was done a few days ago.  She reminded me  about the strong family history of ILD that she has.  Her brother died at age 59 7 years ago her sister died in her 72s also from ILD.  She is interested in seeing the Retail buyer.  I have sent email to genetics counselor Roma Kayser within our health system.    ROS   OV 04/29/2019  Subjective:  Patient ID: [name], female , DOB: 06-19-1940 , age 34 y.o. , MRN: [id number] , ADDRESS: [id number] [place] [address]   04/29/2019 -   Chief Complaint  Patient presents with   Follow-up    Pt states she has been doing well since last visit and denies any complaints.    Follow-up idiopathic pulmonary fibrosis progressive now on nintedanib since mid October 2020. HPI Dama Hedgepeth [place] 80 y.o. -returns for follow-up.  She is returning sooner than anticipated.  I saw her 3 weeks ago.  I wanted her back in 4-6 weeks to see how the nintedanib is going.  Due to scheduling issues she comes back in 3 weeks.  She tells me there are no new issues.  She is losing weight.  She did have interim colonoscopy which she says was normal.  She is unsure why she is losing weight.  I explained to her that this could be because of IPF or could be due to previous pirfenidone or current nintedanib.  At this point in time she wants to take the drugs and continue to monitor his situation.  Last liver function test 3 weeks ago was normal.  She is now on 2 L nasal cannula at night since end of October 2020.  She is also using oxygen with exertion.  She wants a light portable system.  She has called [facility].  She wants our  support in getting a lightweight portable oxygen system.     OV 05/28/2019  Subjective:  Patient ID: [name], female , DOB: Apr 21, 1941 , age 62 y.o. , MRN: [id number] , ADDRESS: [id number] [place] [address]   05/28/2019 -   Chief Complaint  Patient presents with   Follow-up    pt reports of sob with exertion. no new concerns or sx.  on 3L at night and 2L during the day PRN.      Follow-up idiopathic pulmonary fibrosis progressive now on nintedanib since mid October 2020.   HPI Reyne Falconi Kopp 80 y.o. - 3L night and 2L night prn in day. Normal plse ox Room air at rest. Tolerting ofev well. No diarrhea. No issue swith appetite. No Nausea, vomitting. Lost some weight 122.4# . She lives < 5 miles Dyspnea is stable. She wants portable o2 - but she says N[facility]ar at [facility] office has not called her back. [facility] was supposed to be the provider Though on subjective score below she is worse but she tells me she  Is stable . LFT is normal \   OV 07/16/2019  Subjective:  Patient ID: [name], female , DOB: 04/11/1941 , age 71 y.o. , MRN: [id number] , ADDRESS: [id number] [place] [address]   07/16/2019 -   Chief Complaint  Patient presents with   Follow-up    Pt states she has been feeling good since last OV. SOB is getting worse. Occ. non productive cough. Pt denies any wheezing, fever, or chills   Follow-up idiopathic pulmonary fibrosis progressive now on nintedanib since mid October 2020. Chronic hypoxemic respiratory failure  due to the above  HPI Americus Scheurich Camargo 80 y.o. -presents for follow-up.  She feels that since her last visit her dyspnea slightly worse.  However on pulmonary function test FVC is stable and DLCO is only a little bit worse.  Her walking desaturation test shows continued stability.  She is pleased about it.  However her symptom score is significantly worse.  Because of the isolation she is not exercising.   There is no chest pain no edema.  Her weight itself is stable.  She was initially losing weight and nintedanib but currently it is stable and is documented below.  She is frustrated by the lack of portable oxygen.  She is saying she is using a heavy tank.  Apparently our office called her home care agency called [facility] but she never heard back.  The medical assistant inquired today and it is likely because of the COVID-19 pandemic portable oxygen systems are not presently available      l OV 10/15/2019  Subjective:  Patient ID: [name], female , DOB: 02-Mar-1941 , age 52 y.o. , MRN: [id number] , ADDRESS: [id number] [place] [address]   10/15/2019 -   Chief Complaint  Patient presents with   Follow-up    Patient reports that she's having to use her oxygen more during the day.     Follow-up idiopathic pulmonary fibrosis progressive now on nintedanib since mid October 2020. Chronic hypoxemic respiratory failure due to the above Hiatal hernia on CT 2020 Normal stress test oct 2019  HPI Leotha Westermeyer [place] 80 y.o. -presents for her 45-monthfollow-up.  She continues on nintedanib she continues to have some diarrhea.  She feels her shortness of breath is getting worse.  She is using more oxygen in the daytime but in talking to her it seems the same as of February 2021.  Her weight is also the same since February 2021.  Subjective dyspnea score is unchanged compared to February 2021 even though she is feeling worse in her history.  Her walking desaturation test is similar to February 2021 even though she feels worse.  She is supposed have pulmonary function test today but for some reason it was not scheduled perhaps due to the pandemic and disruptions of the PFT lab.  Review of the records indicate that last liver function test was early part of 2021.  Review of records also indicate the 2019 echocardiogram shows pulmonary systolic pressure of 63 mmHg along with grade 1  diastolic dysfunction.  She does not have significant edema.  She is going to turn 79 years in J07/16/2024  Her goal is to live past 79.  She is extremely worried that she will might die before JJul 16, 2021  Both her siblings died early from pulmonary fibrosis.  She is not aware that between her last visit in this visit inhaled treprostinil has been approved for pulmonary hypertension related to pulmonary fibrosis     OV 12/31/2019   Subjective:  Patient ID: NRutherford Limerick female , DOB: 7Aug 23, 1942 age 89y.o. years. , MRN: [id number]  ADDRESS: [address] [place] [address]PCP  CPleas Koch NP Providers : Treatment Team:  Attending Provider: RBrand Males MD   Chief Complaint  Patient presents with   Follow-up    pt reports of sob with exertion.    Follow-up idiopathic pulmonary fibrosis progressive now on nintedanib since mid October 2020. Chronic hypoxemic respiratory failure due to the above  Hiatal hernia on CT 2020 Normal stress test oct 2019 Severe pulmonary hypertension diagnosed on right heart catheterization June 2021.    HPI Latrise Bowland Valeriano 80 y.o. -presents for follow-up.  Her weight was between 122 -123 pounds a couple months ago.  Now with 115 pounds.  She believes is because of the pandemic related disruptions.  She also thinks the nintedanib and diarrhea because this weight loss.  She says in the interim on November 19, 2019 she underwent right heart catheterization that showed severe pulmonary hypertension.  And then November 29, 2018 when she ended up with pseudoaneurysm and hematoma.  She was hospitalized here in [place].  After that she was discharged to the nursing home and she just got back a few weeks ago.  She says all this is taken a toll on her weight.  She is now back on the nintedanib for week or 2 or more.  She is tolerating it well.  There is no nausea vomiting.  There is some diarrhea scored 2.  But she says it is actually better.   Nevertheless_she did appears the same.  She says all the recent hospitalization is upset that her emotionally.  Her anxiety and depression scores are slightly higher.  Before she never had it.  She says it is improving.  The medical assistant walked her for routine desaturation.  This time she required 4  liters to correct and even that barely.  We discussed declining functional status and goals of care.  She says she has a living will.  She did not elaborate the content of it.  Advised her DO NOT RESUSCITATE DO NOT INTUBATE she took notified.  She seemed a little upset about the directionality of her disease.  She did ask about life expectancy.  At room air at rest she is normoxic.  Therefore said it could be few months or several months but I was really worried about the loss of weight in the worsening hypoxemia.  She believes the loss of weight is temporally related to recent admission.  She believes she can reset the weight loss by working on it with nutritional changes.  She currently wants to maintain status quo as opposed to calling palliative care.  Also because of the tremendous emotional turmoil with the recent complication of right heart catheterization she is not ready to start inhaled treprostinil.  She does understand that she meets criteria for it. .    November 19, 2019 Elevated right heart pressures with significantly increased transpulmonic gradient with severe pulmonary arterial hypertension with PA pressure 68/31 and mean pressure 46 mmHg. PVR: 5.9 WU (Fick) and 7.1 WU (Thermo) .  Pressures RA: A-wave 7, V wave 6, mean 2 RV: 62/4 PA: 68/31; mean 46 PW: A-wave 7, V wave 8, mean 6       OV 03/24/2020   Subjective:  Patient ID: [name], female , DOB: November 28, 1940, age 4 y.o. years. , MRN: [id number],  ADDRESS: [address] [place] [address] PCP  [name], NP Providers : Treatment Team:  Attending Provider: Brand Males, MD Patient Care  Team: [name], NP as PCP - General (Internal Medicine) Buford Dresser, MD as PCP - Cardiology (Cardiology)    Follow-up idiopathic pulmonary fibrosis progressive now on nintedanib since mid October 2020. Chronic hypoxemic respiratory failure due to the above Hiatal hernia on CT 2020 Normal stress test oct 2019 Severe pulmonary hypertension diagnosed on right heart catheterization June 2021.  -  declined Tyvaso Aug 2021    Chief Complaint  Patient presents with   Follow-up    Doing well on oxygen, on 3-4L at all times. Denies sob, cough or wheezing.       HPI Falesha Schommer Janak 80 y.o. -presents for follow-up of IPF and chronic hypoxemic respiratory failure.  After I saw her last visit she has discussed with the family and she has adopted formally to be no CPR no intubation but still have full medical care.  In the interim her husband also got admitted.  At baseline he uses a walker but now is oxygen dependent.  And she has to take care of him.  So she is struggling spiritually and emotionally with all this.  She did see nurse practitioner and she declined to do treprostinil for her severe pulmonary hypertension.  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  She is having severe diarrhea because of the nintedanib.  She says she has done holiday of the nintedanib in the past only for the diarrhea to come back.  She is not interested in doing lower dose of nintedanib.  She feels she is at a point where if she passes away she is accepted that but she just wants to live the remaining of her life with high quality and not be miserable with medications giving her side effects.  She also is finding the diarrhea be a hindrance being able to take care of the husband.  She is not interested in clinical trials at this point.  She manages  with 3 L.  Her weight loss is stabilized but she believes that the weight loss is because of the nintedanib.  She still is able to drive  ROS - per HPI  ECHO Nov 2019  Study Conclusions   - Left ventricle: The cavity size was normal. Wall thickness was    increased in a pattern of mild LVH. Systolic function was    vigorous. The estimated ejection fraction was in the range of 65%    to 70%. Wall motion was normal; there were no regional wall    motion abnormalities. Doppler parameters are consistent with    abnormal left ventricular relaxation (grade 1 diastolic    dysfunction). Doppler parameters are consistent with    indeterminate ventricular filling pressure.  - Aortic valve: Mildly calcified annulus. Trileaflet.  - Mitral valve: Mildly calcified annulus.  - Left atrium: The atrium was mildly dilated.  - Atrial septum: No defect or patent foramen ovale was identified.  - Tricuspid valve: There was moderate regurgitation.  - Pulmonary arteries: PA peak pressure: 63 mm Hg (S).  - Pericardium, extracardiac: A trivial pericardial effusion was    identified.       OV 10/06/2020  Subjective:  Patient ID: [name], female , DOB: [birth year] , age 80 y.o. , MRN: [id number] , ADDRESS: [address] [place] [address] PCP [name], NP Patient Care Team: [name], NP as PCP - General (Internal Medicine) Buford Dresser, MD as PCP - Cardiology (Cardiology)  This Provider for this visit: Treatment Team:  Attending Provider: Brand Males, MD    10/06/2020 -   Chief Complaint  Patient presents with   Follow-up    Pt states she has been doing okay since last visit and denies any real concerns.  Follow-up idiopathic pulmonary fibrosis progressive   - previously on esbriet 2014 -> oct 2020 and stopped due to progressive disese   - now on nintedanib since mid October 2020. Chronic hypoxemic respiratory failure due to the  above  - 3-4L [place] as of Aprl 2022 Hiatal hernia on CT 2020 Normal stress test oct 2019 Severe pulmonary hypertension diagnosed on right heart catheterization June 2021.  - declined Tyvaso Aug 2021    HPI Takyra Cantrall [place] 80 y.o. -returns for follow-up.  She she will soon be 80 years old.  In the fall 2021 after a visit to see me in [facility] her husband passed away.  She is still grieving but has recovered.  She has lost 2 siblings from pulmonary fibrosis.  She has 1 surviving sibling who is with her.  The sibling is 1 year younger than her and is quite active but does not have pulmonary fibrosis.  Patient initially says she is doing stable and well on 3-4 L nasal cannula but as the interview went on it is apparent that she has difficulty doing a lot of activities of daily living although she is able to still self-care and cook.  The sister lives 10 minutes away and is able to offer support but does not keep an eye on the patient at all times.  Patient again expressed interest in just being on supportive care without any antifibrotic's.  Previously she declined treprostinil for pulmonary hypertension.  She continues to 3-4 L of nasal cannula oxygen.  She is continue to lose some weight but in the house she is able to move around.  She is asking about using facemask oxygen for better relief.  She complains of dry mouth and dry nose.  Talked her about saline spray and using Biotene.  She is interested though in therapeutic options I do not get the GI side effects that nintedanib give.  Currently the only available and trial options.  Her severe pulmonary hypertension may be a disqualify her.    OV 11/30/2020  Subjective:  Patient ID: [name], female , DOB: 1940-08-14 , age 56 y.o. , MRN: [id number] , ADDRESS: [id number] [place] [id number] PCP [name], NP Patient Care Team: [name], NP as PCP - General (Internal Medicine) Buford Dresser, MD as PCP - Cardiology (Cardiology)  This Provider for this visit: Treatment Team:  Attending Provider: Brand Males, MD    11/30/2020 -   Chief Complaint  Patient presents with   Follow-up    PFT performed today.  Pt states she has been doing okay since last visit and states she believes her breathing has become worse. States while wearing the oxygen, she becomes exhausted even after walking about 3 feet and will begin to panic due to not being able to breathe.     HPI Solveig Fangman Popovich 80 y.o. -returns for follow-up with her sister?  Mary.  She is not on any antifibrotic's.  She is reporting worsening hypoxemia.  She says that when she gets up from her chair and goes to the toilet or when she goes to the kitchen her pulse ox drops into the 40s on 4 L oxygen.  She says she is very careful not to fall.  She has extra support from family members cooking for her family members visiting her neighbors checking in on her.  Still she is motivated to try to do better.  She wants to get a scooter  to move around in the house.  She has home palliative care was visited her 1 time.  We discussed hospice as a care option she is more open to listening to it but her body language appears that she is not fully ready for this.  In the past she was not interested in treprostinil but now she is interested given the deterioration.  Did indicate to her that her lung disease may be so far advanced that it might not help.  Did indicate that the benefit is to improve her quality of life and side effects of dizziness and coughing.  She is also lost significant amount of weight as documented below.  She has upcoming visit with cardiology and she has new onset pedal edema in the last week or so.  There is no warmth or cellulitis in the leg.  Overall course is 1 of deterioration.   Extensive time spent discussing her goals of care.       SYMPTOM SCALE - ILD 04/08/2019 126# 04/29/2019 123#  05/28/2019 122# 07/16/2019 123# 10/15/2019 122.6# 12/31/2019 115# 03/24/2020 116# 2020-11-01 112# - husband  Died late 08-16-19 Dec 26, 2020 109#  O2 use Portable o2 as needed          Shortness of Breath 0 -> 5 scale with 5 being worst (score 6 If unable to do)    uses 3 L with exertion and 2-1/2 L at night  3 L at night and 2-1/2 with exertion and at rest in the daytime  required 4 L nasal cannula to correct  On 3-4LNC   At rest _0 Simple tasks - showers, clothes change, eating, shaving _1 Household (dishes, doing bed, laundry) _2 3._3 Shopping _4 Walking level at own pace _5 Walking up Stairs _6 Total (40 - 48) Dyspnea Score 19   24._7 How bad is your cough? _8 0 1   How bad is your fatigue _9 nauea     1 0 0 0   vomit     0 0 0 0   diarrhea     2 2 >5 0   anxiety     0 3 0 1   depression     0 2 0 0        Simple office walk 185 feet x  3 laps goal with forehead probe 04/08/2019  04/29/2019  07/16/2019  10/15/2019  12/31/2019  03/24/2020   O2 used RA RA RA bRL office RA brl office ra ra  Number laps completed 3   3  Did oly 2 of 3 laps desatured in 3/4th of 1 lap   Comments about pace - stopped at 2 Sopped at end of 2nd lap      Resting Pulse Ox/HR 96% and 104/min 95% and HR 80.min 94% and HR 107/min 96% and HR 80./min 94% RA ->  90% RA at rest  Final Pulse Ox/HR 82% and 120/min 85% and 108/min  87% and HR 132.min 86% and HR 115.min 86% with jsue 3/4th of 1 laps ad HR 86   Desaturated </= 88%  yes yes yes yes    Desaturated <= 3% points yes yes yes yes    Got Tachycardic >/= 90/min yes yes yes yes    Symptoms at end of test Very dyspneice Moderate dyspnea Moderate dyspnea Very dyspneic    Miscellaneous comments Started ofev mid oct 2020  Recovered quickly Took few minutes to recover Needed 4LNC pulse ox to correct to 85-89%        PFT  PFT Results Latest Ref Rng  & Units 11/30/2020 12/21/2019 03/12/2019 10/23/2015 03/17/2015 01/03/2015 07/15/2014  FVC-Pre L 0.93 - 1.08 1.44 1.47 1.52 1.59  FVC-Predicted Pre % 38 48 44 56 57 59 61  FVC-Post L - - 1.07 1.47 - 1.49 1.58  FVC-Predicted Post % - - 44 57 - 57 60  Pre FEV1/FVC % % 94 89 87 85 84 83 84  Post FEV1/FCV % % - - 91 86 - 86 85  FEV1-Pre L 0.87 1.05 0.94 1.22 1.24 1.26 1.33  FEV1-Predicted Pre % 48 58 52 64 64 65 68  FEV1-Post L - - 0.97 1.26 - 1.29 1.35  DLCO uncorrected ml/min/mmHg 8.16 - 8.71 12.65 12.11 13.66 13.65  DLCO UNC% % 45 - 49 58 56 63 63  DLCO corrected ml/min/mmHg 8.16 - - 11.83 - - -  DLCO COR %Predicted % 45 - - 54 - - -  DLVA Predicted % 87 - 87 105 109 110 108  TLC L - - 2.47 - - - 3.01  TLC % Predicted % - - 51 - - - 63  RV % Predicted % - - 57 - - - 61       has a past medical history of Anginal pain (HCC), Cancer ([place]), CHF (congestive heart failure) ([place]) (01/22/2012), Chronic fatigue (07/15/2014), CVA (cerebral vascular accident) ([place]) (04/18/2018), Family history of pulmonary fibrosis, GERD (gastroesophageal reflux disease) (05/12/2017), Hypertension, Hypothyroidism, ILD (interstitial lung disease) ([place]) (02/04/2012), IPF (idiopathic pulmonary fibrosis) ([place]) (2013), Mild intermittent asthma (01/29/2016), Pelvic relaxation, Prolapse of bladder, Shortness of breath, and Thyroid disease.   reports that she has never smoked. She has never used smokeless tobacco.  Past Surgical History:  Procedure Laterality Date   ANTERIOR AND POSTERIOR REPAIR  12/05/2011   Procedure: ANTERIOR (CYSTOCELE) AND POSTERIOR REPAIR (RECTOCELE);  Surgeon: [name], MD;  Location: [facility];  Service: Gynecology;  Laterality: N/A;  with cysto   Bladder tact  15 years ago   COLONOSCOPY WITH PROPOFOL N/A 04/20/2019   Procedure: COLONOSCOPY WITH PROPOFOL;  Surgeon: [name], MD;  Location: [facility] ENDOSCOPY;  Service: Gastroenterology;  Laterality: N/A;   LEFT HEART CATHETERIZATION WITH CORONARY ANGIOGRAM  N/A 01/22/2012   Procedure: LEFT HEART CATHETERIZATION WITH CORONARY ANGIOGRAM;  Surgeon: [name], MD;  Location: [facility] CATH LAB;  Service: Cardiovascular;  Laterality: N/A;   NECK SURGERY  2010   PSEUDOANERYSM COMPRESSION Right 11/29/2019   Procedure: PSEUDOANERYSM COMPRESSION;  Surgeon: Algernon Huxley, MD;  Location: [facility] CV LAB;  Service: Cardiovascular;  Laterality: Right;   RIGHT HEART CATH N/A 11/19/2019   Procedure: RIGHT HEART CATH;  Surgeon: [name], MD;  Location: [facility] LAB;  Service: Cardiovascular;  Laterality: N/A;   SHOULDER SURGERY     thyroid disease     THYROID LOBECTOMY Left 07/17/2018   Procedure: LEFT THYROID LOBECTOMY;  Surgeon: [name], MD;  Location: [facility];  Service: General;  Laterality: Left;   TONSILLECTOMY     VAGINAL HYSTERECTOMY  15 years ago  Allergies  Allergen Reactions   Crestor [Rosuvastatin Calcium] Other (See Comments)    Myalgias, muscle weakness   Dilaudid [Hydromorphone] Nausea And Vomiting   Ofev [Nintedanib] Diarrhea    Immunization History  Administered Date(s) Administered   Fluad Quad(high Dose 65+) 03/02/2020   Influenza Split 02/22/2012, 03/01/2013, 02/08/2014, 01/27/2015   Influenza Whole 04/11/2011, 02/10/2017   Influenza, High Dose Seasonal PF 02/16/2017, 01/20/2019   Influenza,inj,Quad PF,6+ Mos 03/11/2016   Influenza-Unspecified 01/26/2018   PFIZER Comirnaty(Gray Top)Covid-19 Tri-Sucrose Vaccine 08/20/2020   PFIZER(Purple Top)SARS-COV-2 Vaccination 06/30/2019, 07/21/2019   Pneumococcal Conjugate-13 03/29/2014   Pneumococcal Polysaccharide-23 12/08/2017   Pneumococcal-Unspecified 04/11/2011, 05/13/2016   Td 08/30/2008   Tdap 11/16/2017   Zoster Recombinat (Shingrix) 09/20/2016, 02/19/2017   Zoster, Live 08/07/2011    Family History  Problem Relation Age of Onset   Thyroid disease Mother    Hypertension Mother    Coronary artery disease Mother    Dementia Mother    Stroke Mother     Aneurysm Father    Pulmonary fibrosis Sister    Stroke Other    Sleep apnea Other    Liver disease Brother    Pulmonary fibrosis Brother    Stroke Maternal Grandmother    Dementia Maternal Grandmother    Heart attack Maternal Grandfather 54   COPD Son      Current Outpatient Medications:    acetaminophen (TYLENOL) 500 MG tablet, Take 1,000 mg by mouth every 6 (six) hours as needed for mild pain or headache. , Disp: , Rfl:    amLODipine (NORVASC) 5 MG tablet, TAKE 1 TABLET BY MOUTH EVERY DAY FOR BLOOD PRESSURE, Disp: 90 tablet, Rfl: 1   atorvastatin (LIPITOR) 40 MG tablet, TAKE 1 TABLET (40 MG TOTAL) BY MOUTH AT BEDTIME. FOR CHOLESTEROL., Disp: 90 tablet, Rfl: 3   CALCIUM CARBONATE-VITAMIN D PO, Take 1 tablet by mouth daily. , Disp: , Rfl:    clopidogrel (PLAVIX) 75 MG tablet, Take 1 tablet (75 mg total) by mouth daily. For stroke prevention., Disp: 90 tablet, Rfl: 3   ferrous sulfate 325 (65 FE) MG tablet, Take 325 mg by mouth every evening. , Disp: , Rfl:    furosemide (LASIX) 20 MG tablet, Take 1 tablet (20 mg total) by mouth daily., Disp: 30 tablet, Rfl: 5   levothyroxine (SYNTHROID) 88 MCG tablet, TAKE 1 TABLET BY MOUTH EVERY MORNING ON AN EMPTY STOMACH WITH WATER ONLY. NO FOOD OR OTHER MEDICATIONS FOR 30 MINUTES., Disp: 90 tablet, Rfl: 3   loratadine (CLARITIN) 10 MG tablet, Take 10 mg by mouth daily. , Disp: , Rfl:    metoprolol succinate (TOPROL-XL) 25 MG 24 hr tablet, TAKE 1 TABLET BY MOUTH EVERY DAY, Disp: 90 tablet, Rfl: 3   Multiple Vitamins-Minerals (CENTRUM SILVER PO), Take 1 tablet by mouth daily., Disp: , Rfl:    omeprazole (PRILOSEC) 20 MG capsule, TAKE 1 CAPSULE BY MOUTH EVERY DAY, Disp: 90 capsule, Rfl: 3   potassium chloride (KLOR-CON) 10 MEQ tablet, Take 1 tablet (10 mEq total) by mouth daily., Disp: 30 tablet, Rfl: 5      Objective:   Vitals:   11/30/20 1108  BP: 118/68  Pulse: (!) 59  SpO2: (!) 82%  Weight: 109 lb (49.4 kg)  Height: 5' 2.5" (1.588 m)     Estimated body mass index is 19.62 kg/m as calculated from the following:   Height as of this encounter: 5' 2.5" (1.588 m).   Weight as of this encounter: 109 lb (49.4 kg).  _0 @  Filed Weights   11/30/20 1108  Weight: 109 lb (49.4 kg)     Physical Exam Frail lady with pedal edema sitting on the wheelchair.  Has crackles on the lung.  Alert and oriented x3 oxygen on.      Assessment:       ICD-10-CM   1. Chronic respiratory failure with hypoxia (HCC)  J96.11     2. IPF (idiopathic pulmonary fibrosis) ([place])  J84.112     3. 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 Goals of care, counseling/discussion  Z71.89     5. Pulmonary HTN (HCC)  I27.20     6. Pedal edema  R60.0     7. Weight loss, non-intentional  R63.4     8. DNR (do not resuscitate)  Z66     9. DNI (do not intubate)  Z78.9      Significant deterioration.  Her life expectancy is limited and she would be hospice eligible.  Discussed several aspects of hospice.  With the option of doing inhaled treprostinil.  Indicated to her that it might not be of benefit when this is so far advanced.  It appears she is motivated to give it a try at this point in time.  Therefore we will make a prescription for it.  We will see if she gets approved for it if there is delays in approval she is declining then she is definitely open to getting hospice.  Initiate Lasix therapy for pedal edema.  Support the DNR and DNI that exist.  We will inform palliative care to do a MOST form for her.  Advised her and her sister to get increased domestic support because she is at risk of falling and injuring herself or being found dead at home.  I would suspect that her life expectancy is in the order of months    Plan:     Patient Instructions     ICD-10-CM   1. Chronic respiratory failure with hypoxia (HCC)  J96.11     2. IPF (idiopathic pulmonary fibrosis) ([place])  J84.112     3. Familial idiopathic pulmonary  fibrosis ([place])  J84.112     4. Goals of care, counseling/discussion  Z71.89     5. Pulmonary HTN (HCC)  I27.20     6. Pedal edema  R60.0     7. Weight loss, non-intentional  R63.4     8. DNR (do not resuscitate)  Z66     9. 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[place])  - progressive disease but stable since last visit - off ofev due to severe diarrehea in oct 2021 (previously on esbriet 2014 -> oct 2020 and stopped due to progressive disese) - on 3-4L [place] but this is not enough now  Plan - continue o2 for pulse ox goal > 86% - increase to 8-10L [place] - get scooter for mobility at home  - increase domestic support at home including life alert button or some easy call to family/friends - watch for fall risk - disease is too advanced for trials - no more breathing tests  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 New onset pedal edema - June 2022  Plan - refer [facility] for Tyvaso start - might help with quality of life  - start lasix 23m once daily - with kcl 147m daily -  30 day supply  Goals of care, counseling/discussion Grief reaction   - support DNR, DNI but stil full medical care including trials - glad palliative care is helping you  - you do qualify for hospice  - I sense that you still want to be able to do better  Plan - certainly if delays with tyvaso or getting worse or tyvaso not helping - enroll hospice strongly  recomended   Weight loss  -Probably due to advanced lung disease and ongoing - bad sign  Plan  - monitor  Followup  - 4 weeks with app for tele orvideo or face to face - [place] or [facility]  ( Level 05 visit: Estb 40-54 min    in  visit type: on-site physical face to visit  in total care time and counseling or/and coordination of care by this undersigned MD - Dr Brand Males. This includes one or more of the following on  this same day 11/30/2020: pre-charting, chart review, note writing, documentation discussion of test results, diagnostic or treatment recommendations, prognosis, risks and benefits of management options, instructions, education, compliance or risk-factor reduction. It excludes time spent by the [place] or office staff in the care of the patient. Actual time 39 min)   SIGNATURE    Dr. Brand Males, M.D., F.C.C.P,  Pulmonary and Critical Care Medicine Staff Physician, [place] Director - Interstitial Lung Disease  Program  Pulmonary [place] at [place], [place], [address]  Pager: [phone], If no answer or between  15:00h - 7:00h: call [phone] Telephone: [phone]  4:59 PM 11/30/2020

## 2020-11-30 NOTE — Patient Instructions (Addendum)
ICD-10-CM   1. Chronic respiratory failure with hypoxia (HCC)  J96.11     2. IPF (idiopathic pulmonary fibrosis) ([place])  J84.112     3. 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 Goals of care, counseling/discussion  Z71.89     5. Pulmonary HTN (HCC)  I27.20     6. Pedal edema  R60.0     7. Weight loss, non-intentional  R63.4     8. DNR (do not resuscitate)  Z66     9. 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[place])  - progressive disease but stable since last visit - off ofev due to severe diarrehea in oct 2021 (previously on esbriet 2014 -> oct 2020 and stopped due to progressive disese) - on 3-4L [place] but this is not enough now  Plan - continue o2 for pulse ox goal > 86% - increase to 8-10L [place] - get scooter for mobility at home  - increase domestic support at home including life alert button or some easy call to family/friends - watch for fall risk - disease is too advanced for trials - no more breathing tests  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 New onset pedal edema - June 2022  Plan - refer [facility] for Tyvaso start - might help with quality of life  - start lasix 6m once daily - with kcl 135m daily - 30 day supply  Goals of care, counseling/discussion Grief reaction   - support DNR, DNI but stil full medical care including trials - glad palliative care is helping you  - you do qualify for hospice  - I sense that you still want to be able to do better  Plan - certainly if delays with tyvaso or getting worse or tyvaso not helping - enroll hospice strongly  recomended   Weight loss  -Probably due to advanced lung disease and ongoing - bad sign  Plan  - monitor  Followup  - 4 weeks with app for tele orvideo or face to face - [place] or [facility]

## 2020-11-30 NOTE — Telephone Encounter (Signed)
Tina Calderon  Cuold you please pass onto her palliative provider to ensure there is a MOST FORM for her  tHanks  MR

## 2020-11-30 NOTE — Patient Instructions (Signed)
Spirometry/DLCO performed today.

## 2020-12-01 NOTE — Telephone Encounter (Signed)
Checked pt's chart and saw that pt had Palliative Care consult with Hanley Ben, NP which can be seen in Epic. Routing this to her as an Pharmacist, hospital

## 2020-12-03 NOTE — Progress Notes (Signed)
HPI  [name] presents today to [facility] with her sister Tina Calderon, for Initial visit with pharmacy team for Tyvaso counseling and discuss next steps in plan of care. 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  Her sister currently resides with her though this is not the long-term plan.  She was last seen by Dr. [name] on 11/30/20 at which time [name] was discussed as an add-on for quality of life purposes.  Though Dr. [name] had discussed palliative care and hospice, he had made the referral to pharmacy clinic to review Tyvaso in detail prior to completing referral form  Therapy for ILD: Esbriet from 2014 through October 2020. Esbriet was stopped d/t progressive disease. She was switched to [facility] in October 2021 but had severe diarrhea.  Anticoagulant use: No but does use antiplatelet  OBJECTIVE Allergies  Allergen Reactions   Crestor [Rosuvastatin Calcium] Other (See Comments)    Myalgias, muscle weakness   Dilaudid [Hydromorphone] Nausea And Vomiting   Ofev [Nintedanib] Diarrhea    Outpatient Encounter Medications as of 12/04/2020  Medication Sig   acetaminophen (TYLENOL) 500 MG tablet Take 1,000 mg by mouth every 6 (six) hours as needed for mild pain or headache.    amLODipine (NORVASC) 5 MG tablet TAKE 1 TABLET BY MOUTH EVERY DAY FOR BLOOD PRESSURE   atorvastatin (LIPITOR) 40 MG tablet TAKE 1 TABLET (40 MG TOTAL) BY MOUTH AT BEDTIME. FOR CHOLESTEROL.   CALCIUM CARBONATE-VITAMIN D PO Take 1 tablet by mouth daily.    clopidogrel (PLAVIX) 75 MG tablet Take 1 tablet (75 mg total) by mouth daily. For stroke prevention.   ferrous sulfate 325 (65 FE) MG tablet Take 325 mg by mouth every evening.    furosemide (LASIX) 20 MG tablet Take 1 tablet (20 mg total) by mouth daily.   levothyroxine (SYNTHROID) 88 MCG tablet TAKE 1 TABLET BY MOUTH EVERY MORNING ON AN EMPTY STOMACH WITH WATER ONLY. NO FOOD OR OTHER MEDICATIONS FOR 30  MINUTES.   loratadine (CLARITIN) 10 MG tablet Take 10 mg by mouth daily.    metoprolol succinate (TOPROL-XL) 25 MG 24 hr tablet TAKE 1 TABLET BY MOUTH EVERY DAY   Multiple Vitamins-Minerals (CENTRUM SILVER PO) Take 1 tablet by mouth daily.   omeprazole (PRILOSEC) 20 MG capsule TAKE 1 CAPSULE BY MOUTH EVERY DAY   potassium chloride (KLOR-CON) 10 MEQ tablet Take 1 tablet (10 mEq total) by mouth daily.   No facility-administered encounter medications on file as of 12/04/2020.     Immunization History  Administered Date(s) Administered   Fluad Quad(high Dose 65+) 03/02/2020   Influenza Split 02/22/2012, 03/01/2013, 02/08/2014, 01/27/2015   Influenza Whole 04/11/2011, 02/10/2017   Influenza, High Dose Seasonal PF 02/16/2017, 01/20/2019   Influenza,inj,Quad PF,6+ Mos 03/11/2016   Influenza-Unspecified 01/26/2018   PFIZER Comirnaty(Gray Top)Covid-19 Tri-Sucrose Vaccine 08/20/2020   PFIZER(Purple Top)SARS-COV-2 Vaccination 06/30/2019, 07/21/2019   Pneumococcal Conjugate-13 03/29/2014   Pneumococcal Polysaccharide-23 12/08/2017   Pneumococcal-Unspecified 04/11/2011, 05/13/2016   Td 08/30/2008   Tdap 11/16/2017   Zoster Recombinat (Shingrix) 09/20/2016, 02/19/2017   Zoster, Live 08/07/2011     HRCT (01/15/2019): Redemonstrated moderate pulmonary fibrosis in a pattern with an apical to basal gradient, featuring peripheral interstitial opacity and some suggestion of nodularity, mild traction bronchiectasis, and occasional areas of subpleural bronchiolectasis in the bases. No evidence of honeycombing. Moderate lobular air trapping on expiratory phase imaging. Not significantly changed when compared to immediate prior examination dated 11/28/2017, however are slightly worsened over time  on comparison to multiple prior examinations dating back to at least 01/24/2012.   PFT's TLC  Date Value Ref Range Status  03/12/2019 2.47 L Final     CMP     Component Value Date/Time   NA 138 11/29/2019 0445    NA 141 11/15/2019 1235   NA 138 12/18/2013 1504   K 4.4 11/29/2019 0445   K 3.4 (L) 12/18/2013 1504   CL 105 11/29/2019 0445   CL 103 12/18/2013 1504   CO2 27 11/29/2019 0445   CO2 24 12/18/2013 1504   GLUCOSE 97 11/29/2019 0445   GLUCOSE 125 (H) 12/18/2013 1504   BUN 13 11/29/2019 0445   BUN 14 11/15/2019 1235   BUN 17 12/18/2013 1504   CREATININE 0.70 11/29/2019 0445   CREATININE 0.88 12/18/2013 1504   CALCIUM 8.6 (L) 11/29/2019 0445   CALCIUM 8.5 12/18/2013 1504   PROT 7.0 10/15/2019 1051   PROT 7.1 12/18/2013 1504   ALBUMIN 4.0 10/15/2019 1051   ALBUMIN 3.5 12/18/2013 1504   AST 25 10/15/2019 1051   AST 32 12/18/2013 1504   ALT 27 10/15/2019 1051   ALT 34 12/18/2013 1504   ALKPHOS 50 10/15/2019 1051   ALKPHOS 82 12/18/2013 1504   BILITOT 0.6 10/15/2019 1051   BILITOT 0.3 12/18/2013 1504   GFRNONAA >60 11/29/2019 0445   GFRNONAA >60 12/18/2013 1504   GFRAA >60 11/29/2019 0445   GFRAA >60 12/18/2013 1504     CBC    Component Value Date/Time   WBC 6.8 02/02/2020 0955   RBC 4.56 02/02/2020 0955   HGB 13.2 02/02/2020 0955   HGB 11.5 11/15/2019 1235   HCT 40.6 02/02/2020 0955   HCT 35.1 11/15/2019 1235   PLT 161.0 02/02/2020 0955   PLT 160 11/15/2019 1235   MCV 88.9 02/02/2020 0955   MCV 95 11/15/2019 1235   MCV 83 12/18/2013 1504   MCH 30.7 12/02/2019 0500   MCHC 32.4 02/02/2020 0955   RDW 15.1 02/02/2020 0955   RDW 12.8 11/15/2019 1235   RDW 14.4 12/18/2013 1504   LYMPHSABS 0.9 03/01/2019 1108   LYMPHSABS 0.6 (L) 12/18/2013 1504   MONOABS 0.6 03/01/2019 1108   MONOABS 0.7 12/18/2013 1504   EOSABS 0.2 03/01/2019 1108   EOSABS 0.1 12/18/2013 1504   BASOSABS 0.0 03/01/2019 1108   BASOSABS 0.0 12/18/2013 1504     LFT's Hepatic Function Latest Ref Rng & Units 10/15/2019 07/16/2019 05/24/2019  Total Protein 6.5 - 8.1 g/dL 7.0 6.8 6.8  Albumin 3.5 - 5.0 g/dL 4.0 3.8 4.1  AST 15 - 41 U/L _0 ALT 0 - 44 U/L _1 Alk Phosphatase 38 - 126 U/L 50 47  60  Total Bilirubin 0.3 - 1.2 mg/dL 0.6 0.8 0.6  Bilirubin, Direct 0.0 - 0.2 mg/dL 0.2 0.1 0.2    ASSESSMENT  Tyvaso Medication Management Patient counseled on purpose, proper use, and common side effects of Tyvaso including cough, headache, nausea, dizziness, flushing, throat irritation and pharyngolaryngeal pain.  Reviewed less common but serious side effects including low blood pressure and increased bleeding risk.  We reviewed a 20-min Tyvaso use, set up, nurse visit information video together with her sister. Ms. Eppes was overwhelmed watching the set-up - advised that nurse will visit her home to help her start treatment but she still stated reluctance to start.  Goals of therapy: for patient, quality of life is most important and indicator for her when starting Tyvaso. We reviewed that the  6MWT would not be feasible as marker for effectiveness for her given that she is in wheelchair.  What the studies show: increase mobility (improved 6MWT), imrpvoed amount of strain on heart based on NT-proBNP)functional quality of life as measured by improved 6MWT, reduced strain on heart (based on NT-proBNP), and reduced risk of PH-ILD progression  Dose for PH-ILD: 18 mcg (3 inhalations) 4 times per day administered every 4 hours while patient is awake; if 3 inhalations are not tolerated, reduce to 1 to 2 inhalations, then increase to 3 inhalations as tolerated. Goal is to increase each dose by 3 inhalations at ~1- to 2-week intervals as tolerated. Discussed that dose is increased by tolerability.  Target dose is 72 mcg (12 inhalations) 4 times per day.   Adverse Effects: Common: cough, headache, nausea, dizziness, flushing, throat irritation and pharyngolaryngeal pain. Less frequent but serious: low blood pressure and increased bleeding risk  Access: Discussed coverage through medical benefit. Discussed specialty pharmacy nurse who makes home visit for first dose of Tyvaso. Discussed possibility  of moving to DPI option in future once stable on therapy  Medication Reconciliation  A drug regimen assessment was performed, including review of allergies, interactions, disease-state management, dosing and immunization history. Medications were reviewed with the patient, including name, instructions, indication, goals of therapy, potential side effects, importance of adherence, and safe use.  Anticoagulant use: No She does currently take clopidogrel.  PLAN - After discussion of dosing, side effects, device management, and access through [facility] and nurse home visits for training, she states that the idea of putting the device together every day, washing pieces nightly, and requiring four treatment sessions daily is beyond her ability to manage. We reviewed a 20-min video about Tyvaso use together with her sister. She states she gets anxious going to the restroom and does not want to add on a medication that would make her daily life more cumbersome. She states that her sister will not be staying with her long-term to be able to help manage Tyvaso and does not personally feel able to manage it on her own.  After a discussion of goals of therapy with Tyvaso, she expresses appreciation for reviewing Tyvaso, but she would like to defer Tyvaso referral and would like to move forward with hospice care referral instead. Routing to Dr. [name].  All questions encouraged and answered.  Instructed patient to call with any further questions or concerns.  Thank you for allowing pharmacy to participate in this patient's care.  Knox Saliva, PharmD, MPH Clinical Pharmacist (Rheumatology and Pulmonology)

## 2020-12-04 ENCOUNTER — Other Ambulatory Visit: Payer: Self-pay

## 2020-12-04 ENCOUNTER — Ambulatory Visit: Payer: Medicare PPO | Admitting: Pharmacist

## 2020-12-04 DIAGNOSIS — Z79899 Other long term (current) drug therapy: Secondary | ICD-10-CM

## 2020-12-04 DIAGNOSIS — J84112 Idiopathic pulmonary fibrosis: Secondary | ICD-10-CM

## 2020-12-18 ENCOUNTER — Other Ambulatory Visit: Payer: Self-pay

## 2020-12-18 ENCOUNTER — Other Ambulatory Visit: Payer: Medicare PPO | Admitting: Student

## 2020-12-18 ENCOUNTER — Telehealth: Payer: Self-pay | Admitting: Internal Medicine

## 2020-12-18 DIAGNOSIS — J849 Interstitial pulmonary disease, unspecified: Secondary | ICD-10-CM | POA: Diagnosis not present

## 2020-12-18 DIAGNOSIS — Z515 Encounter for palliative care: Secondary | ICD-10-CM

## 2020-12-18 DIAGNOSIS — R600 Localized edema: Secondary | ICD-10-CM

## 2020-12-18 DIAGNOSIS — R634 Abnormal weight loss: Secondary | ICD-10-CM | POA: Diagnosis not present

## 2020-12-18 DIAGNOSIS — I2723 Pulmonary hypertension due to lung diseases and hypoxia: Secondary | ICD-10-CM

## 2020-12-18 DIAGNOSIS — R0602 Shortness of breath: Secondary | ICD-10-CM

## 2020-12-18 NOTE — Progress Notes (Signed)
[place] Consult Note Telephone: [phone]  Fax: [phone]    Date of encounter: 12/18/20 PATIENT NAME: Tina Calderon [address] [place] [address]   [phone] (home)  DOB: 1940/09/27 MRN: [id number] PRIMARY CARE PROVIDER:    [name], NP,  [address] [place] [address] [phone]  REFERRING PROVIDER:   Dr. [name]  RESPONSIBLE PARTY:    Contact Information     Name Relation Home Work Mobile   [name] Daughter   [phone]   [facility] Sister   [phone]        I met face to face with patient and family in the home. Sister Stanton Kidney is present. Palliative Care was asked to follow this patient by consultation request of  [name], NP to address advance care planning and complex medical decision making. This is a follow up visit.                                   ASSESSMENT AND PLAN / RECOMMENDATIONS:   Advance Care Planning/Goals of Care: Goals include to maximize quality of life and symptom management. Our advance care planning conversation included a discussion about:    The value and importance of advance care planning  Experiences with loved ones who have been seriously ill or have died  Exploration of personal, cultural or spiritual beliefs that might influence medical decisions  Exploration of goals of care in the event of a sudden injury or illness  Identification and preparation of a healthcare agent Johnny Phoebe Perch MOST for updated to denote no feeding tube. CODE STATUS: DNR   Education provided on Palliative Medicine vs. Hospice services. Discussed ongoing declines. She does not wish to seek any aggressive treatments, is open to comfort path/hospice. Discussed with Hospice MD; patient is eligible for hospice evaluation. Will refer for hospice admission.   Symptom Management/Plan:  Dyspnea-secondary to idiopathic pulmonary  fibrosis, chronic respiratory failure, diastolic HF-patient with worsening symptoms. Continue oxygen at 8-10 liters via n/c. Discussed energy conservation given worsening disease processes.   LE edema-due to diastolic HF, pulmonary hypertension. Continue furosemide 59m daily with potassium chloride 10 mEq daily, elevate legs while in recliner.  Pulmonary hypertension-declined Tyvaso due to difficulty/complexity in managing the device multiple times daily. Continue amlodipine, furosemide, oxygen as directed.   Abnormal weight loss-patient endorses 21 pound loss in the past year in spite of good appetite. Continue foods patient enjoys.     Follow up Palliative Care Visit: Palliative care will continue to follow for complex medical decision making, advance care planning, and clarification of goals. Return  prn.  I spent 60 minutes providing this consultation. More than 50% of the time in this consultation was spent in counseling and care coordination.    PPS: 40%  HOSPICE ELIGIBILITY/DIAGNOSIS: TBD  Chief Complaint: Palliative Medicine   HISTORY OF PRESENT ILLNESS:  NRakia Frayneis a 80y.o. year old female  with PH, ILD, idiopathic pulmonary fibrosis, chronic respiratory failure with hypoxia, chronic diastolic heart failure, CAD, hypertension, hypothyroidism, GERD, hx of CVA.  Patient previously on Esbriet from 2014 to October 2020; stopped due to progressive pulmonary fibrosis. She was started on Ofev October 2020, but later discontinued due to severe GI side effects.  Patient endorses worsening shortness of breath, fatigue in the past two months. Patient de sats with any exertion  to 70's; her baseline is in the mid 80's at 10 liters via n/c. She was previously wearing oxygen at 6-7 liters up until the past 6 weeks. She is now only able to ambulate less than 10 feet before becoming fatigued and having to rest. She is primarily using w/c for locomotion and is being pushed by her  sister. She has to take breaks with speaking as she becomes very short of breath. She endorses a 21 pound weight loss in the past year. Taking a long time to eat, gets out of breath. Has to stop and take breaks when eating. She is requiring more assistance with adl's; her sister has been staying with her the past couple of weeks. Family is providing/cooking meals. Sleeping okay most nights; denies orthopnea. Reports worsening LE edema in the past month; elevates legs and takes furosemide daily. Still continent of bowel of bladder. No recent infections. No recent falls or injury.   History obtained from review of EMR, discussion with primary team, and interview with family, facility staff/caregiver and/or Ms. Engebretsen.  I reviewed available labs, medications, imaging, studies and related documents from the EMR.  Records reviewed and summarized above.   ROS  General: NAD EYES: denies vision changes ENMT: denies dysphagia Cardiovascular: denies chest pain, DOE Pulmonary: cough, increased SOB Abdomen: endorses good appetite, denies constipation, endorses continence of bowel GU: denies dysuria, endorses continence of urine MSK: weakness,  no falls reported Skin: denies rashes or wounds Neurological: denies pain, denies insomnia Psych: Endorses stable mood Heme/lymph/immuno: denies bruises, abnormal bleeding  Physical Exam: Weight: 109 pounds Pulse 96, resp 24, b/p 128/70, sats 85% on 10 lpm Constitutional: NAD General: frail appearing, thin EYES: anicteric sclera, lids intact, no discharge  ENMT: intact hearing, oral mucous membranes moist, dentition intact CV: S1S2, RRR, no LE edema Pulmonary: crackles to left base, otherwise clear,  increased work of breathing, no cough Abdomen: intake 100%, normo-active BS + 4 quadrants, soft and non tender GU: deferred MSK: sarcopenia, moves all extremities Skin: warm and dry, no rashes or wounds on visible skin Neuro:  generalized weakness, A & O x  3 Psych: non-anxious affect Hem/lymph/immuno: no widespread bruising   Thank you for the opportunity to participate in the care of Tina Calderon.  The palliative care team will continue to follow. Please call our office at [phone] if we can be of additional assistance.   Ezekiel Slocumb, NP   COVID-19 PATIENT SCREENING TOOL Asked and negative response unless otherwise noted:   Have you had symptoms of covid, tested positive or been in contact with someone with symptoms/positive test in the past 5-10 days? No

## 2020-12-19 NOTE — Telephone Encounter (Signed)
Called Tina Calderon but she did not answer. I left a detailed message explaining that MR will be out of the office until at least the end of the month but I will forward the message over to him.   MR, please advise if you would be willing to the attending physician for the patient. Thanks!

## 2020-12-22 DIAGNOSIS — J9611 Chronic respiratory failure with hypoxia: Secondary | ICD-10-CM | POA: Diagnosis not present

## 2020-12-22 DIAGNOSIS — R0602 Shortness of breath: Secondary | ICD-10-CM | POA: Diagnosis not present

## 2020-12-22 DIAGNOSIS — I5032 Chronic diastolic (congestive) heart failure: Secondary | ICD-10-CM | POA: Diagnosis not present

## 2020-12-22 DIAGNOSIS — I272 Pulmonary hypertension, unspecified: Secondary | ICD-10-CM | POA: Diagnosis not present

## 2020-12-22 DIAGNOSIS — R29898 Other symptoms and signs involving the musculoskeletal system: Secondary | ICD-10-CM | POA: Diagnosis not present

## 2020-12-22 DIAGNOSIS — I639 Cerebral infarction, unspecified: Secondary | ICD-10-CM | POA: Diagnosis not present

## 2020-12-25 ENCOUNTER — Ambulatory Visit (HOSPITAL_BASED_OUTPATIENT_CLINIC_OR_DEPARTMENT_OTHER): Payer: Medicare PPO | Admitting: Cardiology

## 2020-12-25 ENCOUNTER — Telehealth: Payer: Self-pay | Admitting: Cardiology

## 2020-12-25 NOTE — Telephone Encounter (Signed)
Thanks, I called her.

## 2020-12-25 NOTE — Progress Notes (Incomplete)
Cardiology Office Note:    Date:  12/25/2020   ID:  [name], DOB Oct 13, 1940, MRN [id number]  PCP:  [name], NP  Cardiologist:  Buford Dresser, MD  Referring MD: [name], NP   CC: Follow up  History of Present Illness:    Tina Calderon is a 80 y.o. female with a hx of pulmonary fibrosis who is seen in follow up for evaluation and management of tachycardia, fatigue, exertional intolerance.  Previous Visit: Lost her husband Clair Gulling in November, offered my condolences. She had not been eating well or had much interest in activities, but this is starting to improve. She wants to go to [facility], we discussed having an emergency plan. She is here with a family member today, everyone agrees with the plan.  Asking about activities, etc to try to improve her condition. We discussed several options at length. Also reviewed her medications at length today.   Denies chest pain. No PND, orthopnea, LE edema or unexpected weight gain. No syncope or palpitations.  Today:   She denies any chest pain, shortness of breath, palpitations, or exertional symptoms. No headaches, lightheadedness, or syncope to report. Also has no lower extremity edema, orthopnea or PND.   Past Medical History:  Diagnosis Date   Anginal pain ([place])    Cancer ([place])    CHF (congestive heart failure) ([place]) 01/22/2012   Chronic fatigue 07/15/2014   CVA (cerebral vascular accident) ([place]) 04/18/2018   Family history of pulmonary fibrosis    GERD (gastroesophageal reflux disease) 05/12/2017   Hypertension    Hypothyroidism    ILD (interstitial lung disease) ([place]) 02/04/2012   IPF (idiopathic pulmonary fibrosis) ([place]) 2013   Mild intermittent asthma 01/29/2016   Pelvic relaxation    Prolapse of bladder    Shortness of breath    Thyroid disease     Past Surgical History:  Procedure Laterality Date   ANTERIOR AND POSTERIOR REPAIR  12/05/2011   Procedure: ANTERIOR (CYSTOCELE) AND  POSTERIOR REPAIR (RECTOCELE);  Surgeon: [name], MD;  Location: [facility];  Service: Gynecology;  Laterality: N/A;  with cysto   Bladder tact  15 years ago   COLONOSCOPY WITH PROPOFOL N/A 04/20/2019   Procedure: COLONOSCOPY WITH PROPOFOL;  Surgeon: [name], MD;  Location: [facility] ENDOSCOPY;  Service: Gastroenterology;  Laterality: N/A;   LEFT HEART CATHETERIZATION WITH CORONARY ANGIOGRAM N/A 01/22/2012   Procedure: LEFT HEART CATHETERIZATION WITH CORONARY ANGIOGRAM;  Surgeon: [name], MD;  Location: [facility] CATH LAB;  Service: Cardiovascular;  Laterality: N/A;   NECK SURGERY  2010   PSEUDOANERYSM COMPRESSION Right 11/29/2019   Procedure: PSEUDOANERYSM COMPRESSION;  Surgeon: Algernon Huxley, MD;  Location: [facility];  Service: Cardiovascular;  Laterality: Right;   RIGHT HEART CATH N/A 11/19/2019   Procedure: RIGHT HEART CATH;  Surgeon: [name], MD;  Location: [facility];  Service: Cardiovascular;  Laterality: N/A;   SHOULDER SURGERY     thyroid disease     THYROID LOBECTOMY Left 07/17/2018   Procedure: LEFT THYROID LOBECTOMY;  Surgeon: [name], MD;  Location: [facility];  Service: General;  Laterality: Left;   TONSILLECTOMY     VAGINAL HYSTERECTOMY  15 years ago    Current Medications: Current Outpatient Medications on File Prior to Visit  Medication Sig   acetaminophen (TYLENOL) 500 MG tablet Take 1,000 mg by mouth every 6 (six) hours as needed for mild pain or headache.    amLODipine (NORVASC) 5  MG tablet TAKE 1 TABLET BY MOUTH EVERY DAY FOR BLOOD PRESSURE   atorvastatin (LIPITOR) 40 MG tablet TAKE 1 TABLET (40 MG TOTAL) BY MOUTH AT BEDTIME. FOR CHOLESTEROL.   CALCIUM CARBONATE-VITAMIN D PO Take 1 tablet by mouth daily.    clopidogrel (PLAVIX) 75 MG tablet Take 1 tablet (75 mg total) by mouth daily. For stroke prevention.   ferrous sulfate 325 (65 FE) MG tablet Take 325 mg by mouth every evening.    furosemide (LASIX) 20 MG tablet Take 1 tablet (20 mg total)  by mouth daily.   levothyroxine (SYNTHROID) 88 MCG tablet TAKE 1 TABLET BY MOUTH EVERY MORNING ON AN EMPTY STOMACH WITH WATER ONLY. NO FOOD OR OTHER MEDICATIONS FOR 30 MINUTES.   loratadine (CLARITIN) 10 MG tablet Take 10 mg by mouth daily.    metoprolol succinate (TOPROL-XL) 25 MG 24 hr tablet TAKE 1 TABLET BY MOUTH EVERY DAY   Multiple Vitamins-Minerals (CENTRUM SILVER PO) Take 1 tablet by mouth daily.   omeprazole (PRILOSEC) 20 MG capsule TAKE 1 CAPSULE BY MOUTH EVERY DAY   potassium chloride (KLOR-CON) 10 MEQ tablet Take 1 tablet (10 mEq total) by mouth daily.   No current facility-administered medications on file prior to visit.     Allergies:   Crestor [rosuvastatin calcium], Dilaudid [hydromorphone], and Ofev [nintedanib]   Social History   Tobacco Use   Smoking status: Never   Smokeless tobacco: Never  Vaping Use   Vaping Use: Never used  Substance Use Topics   Alcohol use: No   Drug use: No    Family History: The patient's family history includes Aneurysm in her father; COPD in her son; Coronary artery disease in her mother; Dementia in her maternal grandmother and mother; Heart attack (age of onset: 3) in her maternal grandfather; Hypertension in her mother; Liver disease in her brother; Pulmonary fibrosis in her brother and sister; Sleep apnea in an other family member; Stroke in her maternal grandmother, mother, and another family member; Thyroid disease in her mother.  ROS:   ROS as per HPI, otherwise unremarkable  EKGs/Labs/Other Studies Reviewed:    The following studies were reviewed today:  [place] 11/19/19 RA: A-wave 7, V wave 6, mean 2 RV: 62/4 PA: 68/31; mean 46 PW: A-wave 7, V wave 8, mean 6  Oxygen saturation in the AO 100% and in the PA 80% with the patient on 3 L of oxygen.  By the Fick method, cardiac output 6.8 L/min and cardiac index 4.3 L/min/m. By the thermodilution method, cardiac output 5.7 L/min and cardiac index 3.6 L/min/m.  PVR: By the  Fick method 5.9 WU and by the thermodilution method 7.1 WU  MPI 04/01/18 Nuclear stress EF: 68%. Blood pressure demonstrated a normal response to exercise. There was no ST segment deviation noted during stress. The study is normal. This is a low risk study. The left ventricular ejection fraction is hyperdynamic (>65%).  Echo 04/19/18 - Left ventricle: The cavity size was normal. Wall thickness was   increased in a pattern of mild LVH. Systolic function was   vigorous. The estimated ejection fraction was in the range of 65%   to 70%. Wall motion was normal; there were no regional wall   motion abnormalities. Doppler parameters are consistent with   abnormal left ventricular relaxation (grade 1 diastolic   dysfunction). Doppler parameters are consistent with   indeterminate ventricular filling pressure. - Aortic valve: Mildly calcified annulus. Trileaflet. - Mitral valve: Mildly calcified annulus. - Left atrium:  The atrium was mildly dilated. - Atrial septum: No defect or patent foramen ovale was identified. - Tricuspid valve: There was moderate regurgitation. - Pulmonary arteries: PA peak pressure: 63 mm Hg (S). - Pericardium, extracardiac: A trivial pericardial effusion was   identified.  Echo 01/31/18 Study Conclusions   - Left ventricle: The cavity size was normal. Wall thickness was   normal. Systolic function was normal. The estimated ejection   fraction was in the range of 60% to 65%. Wall motion was normal;   there were no regional wall motion abnormalities. Doppler   parameters are consistent with abnormal left ventricular   relaxation (grade 1 diastolic dysfunction). - Aortic valve: Mildly calcified annulus. Normal thickness   leaflets. Valve area (VTI): 1.83 cm^2. Valve area (Vmax): 2.06   cm^2. Valve area (Vmean): 1.74 cm^2. - Mitral valve: Mildly calcified annulus. Normal thickness leaflets   . - Left atrium: The atrium was severely dilated. - Right ventricle: The  cavity size was mildly dilated. - Technically adequate study.  Elevated RA pressures, RV-RA gradient 47 mmHg.   Monitor 01/29/18 Ventricular tachycardia-nonsustained-up to 5 beats Atrial tachycardia-37 episodes     Symptoms of fluttering sinus rhythm;                              rare PAC                             Isolated PVC Symptoms of shortness of breath PACs/PVCs  Long term monitor reviewed. All of her patient triggered events are sinus rhythm, occasionally tachycardia, rare isolated ectopy. She had one episode of 5 beats of NSVT without symptoms, and she had one brief episode of 3 beats of SVT. Rest are isolated atrial ectopy and rare PVCs.  CT angio chest 01/12/18 IMPRESSION: 1. No acute findings. No pulmonary embolism seen. No evidence of pneumonia or pulmonary edema. 2. 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 3. Large hiatal hernia, stable. 4. Cardiomegaly. 5. Coronary artery calcifications.  EKG:  EKG personally reviewed today.  12/25/2020: *** 09/08/20: NSR, with PACs and artifact at 96 bpm  Recent Labs: 02/02/2020: Hemoglobin 13.2; Platelets 161.0 07/12/2020: TSH 1.51  Recent Lipid Panel    Component Value Date/Time   CHOL 119 02/02/2020 0955   TRIG 88.0 02/02/2020 0955   HDL 46.70 02/02/2020 0955   CHOLHDL 3 02/02/2020 0955   VLDL 17.6 02/02/2020 0955   LDLCALC 54 02/02/2020 0955    Physical Exam:    VS:  There were no vitals taken for this visit.    Wt Readings from Last 3 Encounters:  11/30/20 109 lb (49.4 kg)  10/06/20 112 lb (50.8 kg)  09/08/20 115 lb 3.2 oz (52.3 kg)   GEN: frail elderly woman in no acute distress, [place] in place HEENT: Normal, moist mucous membranes NECK: No JVD appreciated CARDIAC: regular rhythm, normal S1 and S2, no rubs or gallops. No murmur. VASCULAR: Radial and DP pulses 2+ bilaterally. No carotid  bruits RESPIRATORY:  Fine velcro crackles bilaterally*** ABDOMEN: Soft, non-tender, non-distended MUSCULOSKELETAL:  Ambulates independently SKIN: Warm and dry, trivial bilateral LE edema*** NEUROLOGIC:  Alert and oriented x 3. No focal neuro deficits noted. PSYCHIATRIC:  Normal affect   ASSESSMENT:    No diagnosis found.  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 [name] -had severe diarrhea on prior IPF treatment, stopped -on O2 by nasal cannula chronically -see prior notes re: discussion of advanced directives -she would like to travel. We reviewed nearby hospitals in the area, making sure she has her oxygen, keeping a copy of her medicines and pertinent history with her  palpitations/tachycardia:  -well controlled on metoprolol without worsening her breathing -stable symptoms, though exertion does elevated her heart rate rapidly  Hypertension: -at goal today -continue amlodipine, metoprolol.  Secondary prevention (history of CVA): -recommend heart healthy/Mediterranean diet, with whole grains, fruits, vegetable, fish, lean meats, nuts, and olive oil. Limit salt. -recommend moderate walking, 3-5 times/week for 30-50 minutes each session. Aim for at least 150 minutes.week. Goal should be pace of 3 miles/hours, or walking 1.5 miles in 30 minutes -recommend avoidance of tobacco products. Avoid excess alcohol. -doing well on clopidogrel and atorvastatin  Plan for follow up: 3 mos or sooner PRN***  Buford Dresser, MD, PhD [place]   [facility]   Medication Adjustments/Labs and Tests Ordered: Current medicines are reviewed at length with the patient today.  Concerns regarding medicines are outlined above.  No orders of the defined types were placed in this encounter.  No orders of the defined types were placed in this encounter.   There are no Patient  Instructions on file for this visit. Signed,  I,Mathew Stumpf,acting as a scribe for Buford Dresser, MD.,have documented all relevant documentation on the behalf of Buford Dresser, MD,as directed by  Buford Dresser, MD while in the presence of Buford Dresser, MD.  ***  Buford Dresser, MD PhD 12/25/2020    [place]

## 2020-12-25 NOTE — Telephone Encounter (Signed)
Returned call to patient. She has entered under hospice care and no longer requires follow up. She expressed her thanks for all of the care she has received with our group. I offered my support and assistance if I can be helpful in any way. It has been my honor to participate in her care.  Buford Dresser, MD, PhD, [place]  [address] [place], [place] [address] [phone]

## 2020-12-25 NOTE — Telephone Encounter (Signed)
Pt had appt today with Dr. Harrell Gave but had to cancel beings that she is under hospice care. Would like to speak with Dr. Harrell Gave so she can thank her.

## 2020-12-29 ENCOUNTER — Ambulatory Visit: Payer: Medicare PPO | Admitting: Adult Health

## 2021-01-18 NOTE — Telephone Encounter (Signed)
Yes I can be atttending

## 2021-01-18 NOTE — Telephone Encounter (Signed)
I have called and LM on VM for Tina Calderon and made her aware that MR would be the attending for hospice for this pt.  Nothing further is needed.

## 2021-02-08 DEATH — deceased

## 2022-04-08 ENCOUNTER — Encounter (INDEPENDENT_AMBULATORY_CARE_PROVIDER_SITE_OTHER): Payer: Self-pay
